# Patient Record
Sex: Male | Born: 1935 | Race: White | Hispanic: No | Smoking: Never smoker
Health system: Southern US, Community
[De-identification: ages and names within clinical notes are randomized; demographics above are authoritative.]

## PROBLEM LIST (undated history)

## (undated) DIAGNOSIS — K5790 Diverticulosis of intestine, part unspecified, without perforation or abscess without bleeding: Secondary | ICD-10-CM

## (undated) DIAGNOSIS — K224 Dyskinesia of esophagus: Secondary | ICD-10-CM

## (undated) DIAGNOSIS — M2540 Effusion, unspecified joint: Secondary | ICD-10-CM

## (undated) DIAGNOSIS — I2510 Atherosclerotic heart disease of native coronary artery without angina pectoris: Secondary | ICD-10-CM

## (undated) DIAGNOSIS — Z860100 Personal history of colon polyps, unspecified: Secondary | ICD-10-CM

## (undated) DIAGNOSIS — Z148 Genetic carrier of other disease: Secondary | ICD-10-CM

## (undated) DIAGNOSIS — R238 Other skin changes: Secondary | ICD-10-CM

## (undated) DIAGNOSIS — D649 Anemia, unspecified: Secondary | ICD-10-CM

## (undated) DIAGNOSIS — R197 Diarrhea, unspecified: Secondary | ICD-10-CM

## (undated) DIAGNOSIS — G8929 Other chronic pain: Secondary | ICD-10-CM

## (undated) DIAGNOSIS — F419 Anxiety disorder, unspecified: Secondary | ICD-10-CM

## (undated) DIAGNOSIS — F329 Major depressive disorder, single episode, unspecified: Secondary | ICD-10-CM

## (undated) DIAGNOSIS — M2550 Pain in unspecified joint: Secondary | ICD-10-CM

## (undated) DIAGNOSIS — M545 Low back pain, unspecified: Secondary | ICD-10-CM

## (undated) DIAGNOSIS — J42 Unspecified chronic bronchitis: Secondary | ICD-10-CM

## (undated) DIAGNOSIS — G2581 Restless legs syndrome: Secondary | ICD-10-CM

## (undated) DIAGNOSIS — G4733 Obstructive sleep apnea (adult) (pediatric): Secondary | ICD-10-CM

## (undated) DIAGNOSIS — I499 Cardiac arrhythmia, unspecified: Secondary | ICD-10-CM

## (undated) DIAGNOSIS — R350 Frequency of micturition: Secondary | ICD-10-CM

## (undated) DIAGNOSIS — M5430 Sciatica, unspecified side: Secondary | ICD-10-CM

## (undated) DIAGNOSIS — E119 Type 2 diabetes mellitus without complications: Secondary | ICD-10-CM

## (undated) DIAGNOSIS — H35343 Macular cyst, hole, or pseudohole, bilateral: Secondary | ICD-10-CM

## (undated) DIAGNOSIS — M1990 Unspecified osteoarthritis, unspecified site: Secondary | ICD-10-CM

## (undated) DIAGNOSIS — E8801 Alpha-1-antitrypsin deficiency: Secondary | ICD-10-CM

## (undated) DIAGNOSIS — K449 Diaphragmatic hernia without obstruction or gangrene: Secondary | ICD-10-CM

## (undated) DIAGNOSIS — N3010 Interstitial cystitis (chronic) without hematuria: Secondary | ICD-10-CM

## (undated) DIAGNOSIS — D126 Benign neoplasm of colon, unspecified: Secondary | ICD-10-CM

## (undated) DIAGNOSIS — K222 Esophageal obstruction: Secondary | ICD-10-CM

## (undated) DIAGNOSIS — K219 Gastro-esophageal reflux disease without esophagitis: Secondary | ICD-10-CM

## (undated) DIAGNOSIS — G4700 Insomnia, unspecified: Secondary | ICD-10-CM

## (undated) DIAGNOSIS — E785 Hyperlipidemia, unspecified: Secondary | ICD-10-CM

## (undated) DIAGNOSIS — F32A Depression, unspecified: Secondary | ICD-10-CM

## (undated) DIAGNOSIS — I480 Paroxysmal atrial fibrillation: Secondary | ICD-10-CM

## (undated) DIAGNOSIS — K5792 Diverticulitis of intestine, part unspecified, without perforation or abscess without bleeding: Secondary | ICD-10-CM

## (undated) DIAGNOSIS — R351 Nocturia: Secondary | ICD-10-CM

## (undated) DIAGNOSIS — M109 Gout, unspecified: Secondary | ICD-10-CM

## (undated) DIAGNOSIS — R0609 Other forms of dyspnea: Secondary | ICD-10-CM

## (undated) DIAGNOSIS — R233 Spontaneous ecchymoses: Secondary | ICD-10-CM

## (undated) DIAGNOSIS — I10 Essential (primary) hypertension: Secondary | ICD-10-CM

## (undated) DIAGNOSIS — K2970 Gastritis, unspecified, without bleeding: Secondary | ICD-10-CM

## (undated) DIAGNOSIS — R0600 Dyspnea, unspecified: Secondary | ICD-10-CM

## (undated) HISTORY — PX: OTHER SURGICAL HISTORY: SHX169

## (undated) HISTORY — DX: Diverticulosis of intestine, part unspecified, without perforation or abscess without bleeding: K57.90

## (undated) HISTORY — DX: Obstructive sleep apnea (adult) (pediatric): G47.33

## (undated) HISTORY — DX: Dyspnea, unspecified: R06.00

## (undated) HISTORY — PX: PARTIAL COLECTOMY: SHX5273

## (undated) HISTORY — DX: Benign neoplasm of colon, unspecified: D12.6

## (undated) HISTORY — PX: COLONOSCOPY: SHX174

## (undated) HISTORY — PX: TONSILLECTOMY AND ADENOIDECTOMY: SUR1326

## (undated) HISTORY — DX: Dyskinesia of esophagus: K22.4

## (undated) HISTORY — PX: JOINT REPLACEMENT: SHX530

## (undated) HISTORY — PX: CORONARY ANGIOPLASTY: SHX604

## (undated) HISTORY — PX: TOTAL KNEE ARTHROPLASTY: SHX125

## (undated) HISTORY — DX: Esophageal obstruction: K22.2

## (undated) HISTORY — DX: Other forms of dyspnea: R06.09

## (undated) HISTORY — DX: Paroxysmal atrial fibrillation: I48.0

## (undated) HISTORY — PX: HEEL SPUR EXCISION: SHX1733

## (undated) HISTORY — PX: RECONSTRUCTION MEDIAL COLLATERAL LIGAMENT ELBOW W/ TENDON GRAFT: SUR1084

## (undated) HISTORY — DX: Diaphragmatic hernia without obstruction or gangrene: K44.9

## (undated) HISTORY — PX: ESOPHAGOGASTRODUODENOSCOPY: SHX1529

## (undated) HISTORY — DX: Interstitial cystitis (chronic) without hematuria: N30.10

## (undated) HISTORY — PX: LUMBAR DISC SURGERY: SHX700

## (undated) HISTORY — DX: Diverticulitis of intestine, part unspecified, without perforation or abscess without bleeding: K57.92

## (undated) HISTORY — DX: Gastro-esophageal reflux disease without esophagitis: K21.9

## (undated) HISTORY — PX: CYSTOSCOPY WITH URETEROSCOPY: SHX5123

## (undated) HISTORY — PX: FRACTURE SURGERY: SHX138

## (undated) HISTORY — DX: Gastritis, unspecified, without bleeding: K29.70

---

## 1986-03-20 HISTORY — PX: ELBOW SURGERY: SHX618

## 2001-07-26 ENCOUNTER — Encounter: Admission: RE | Admit: 2001-07-26 | Discharge: 2001-07-26 | Payer: Self-pay | Admitting: Internal Medicine

## 2001-07-26 ENCOUNTER — Encounter: Payer: Self-pay | Admitting: Internal Medicine

## 2001-07-27 ENCOUNTER — Encounter: Payer: Self-pay | Admitting: Internal Medicine

## 2001-07-27 ENCOUNTER — Encounter: Admission: RE | Admit: 2001-07-27 | Discharge: 2001-07-27 | Payer: Self-pay | Admitting: Internal Medicine

## 2003-04-08 ENCOUNTER — Encounter: Admission: RE | Admit: 2003-04-08 | Discharge: 2003-04-08 | Payer: Self-pay | Admitting: Internal Medicine

## 2003-05-22 ENCOUNTER — Encounter (INDEPENDENT_AMBULATORY_CARE_PROVIDER_SITE_OTHER): Payer: Self-pay | Admitting: *Deleted

## 2003-05-22 ENCOUNTER — Ambulatory Visit (HOSPITAL_COMMUNITY): Admission: RE | Admit: 2003-05-22 | Discharge: 2003-05-22 | Payer: Self-pay | Admitting: Urology

## 2003-05-22 ENCOUNTER — Ambulatory Visit (HOSPITAL_BASED_OUTPATIENT_CLINIC_OR_DEPARTMENT_OTHER): Admission: RE | Admit: 2003-05-22 | Discharge: 2003-05-22 | Payer: Self-pay | Admitting: Urology

## 2003-07-20 ENCOUNTER — Encounter: Payer: Self-pay | Admitting: Cardiology

## 2003-07-20 ENCOUNTER — Ambulatory Visit (HOSPITAL_COMMUNITY): Admission: RE | Admit: 2003-07-20 | Discharge: 2003-07-20 | Payer: Self-pay | Admitting: Cardiology

## 2004-03-01 ENCOUNTER — Ambulatory Visit: Payer: Self-pay | Admitting: Physical Medicine & Rehabilitation

## 2004-03-01 ENCOUNTER — Inpatient Hospital Stay (HOSPITAL_COMMUNITY): Admission: RE | Admit: 2004-03-01 | Discharge: 2004-03-06 | Payer: Self-pay | Admitting: Orthopedic Surgery

## 2004-07-04 ENCOUNTER — Ambulatory Visit: Payer: Self-pay | Admitting: Gastroenterology

## 2004-08-01 ENCOUNTER — Ambulatory Visit: Payer: Self-pay | Admitting: Gastroenterology

## 2004-08-05 ENCOUNTER — Encounter (INDEPENDENT_AMBULATORY_CARE_PROVIDER_SITE_OTHER): Payer: Self-pay | Admitting: Specialist

## 2004-08-05 ENCOUNTER — Ambulatory Visit: Payer: Self-pay | Admitting: Gastroenterology

## 2004-08-05 ENCOUNTER — Ambulatory Visit (HOSPITAL_COMMUNITY): Admission: RE | Admit: 2004-08-05 | Discharge: 2004-08-05 | Payer: Self-pay | Admitting: Gastroenterology

## 2005-01-23 ENCOUNTER — Ambulatory Visit: Payer: Self-pay | Admitting: Internal Medicine

## 2005-01-30 ENCOUNTER — Encounter (INDEPENDENT_AMBULATORY_CARE_PROVIDER_SITE_OTHER): Payer: Self-pay | Admitting: Specialist

## 2005-01-30 ENCOUNTER — Ambulatory Visit: Payer: Self-pay | Admitting: Internal Medicine

## 2005-01-31 ENCOUNTER — Encounter (INDEPENDENT_AMBULATORY_CARE_PROVIDER_SITE_OTHER): Payer: Self-pay | Admitting: Specialist

## 2005-01-31 ENCOUNTER — Inpatient Hospital Stay (HOSPITAL_COMMUNITY): Admission: RE | Admit: 2005-01-31 | Discharge: 2005-02-06 | Payer: Self-pay | Admitting: General Surgery

## 2005-01-31 ENCOUNTER — Encounter (INDEPENDENT_AMBULATORY_CARE_PROVIDER_SITE_OTHER): Payer: Self-pay | Admitting: *Deleted

## 2005-02-15 ENCOUNTER — Encounter (INDEPENDENT_AMBULATORY_CARE_PROVIDER_SITE_OTHER): Payer: Self-pay | Admitting: *Deleted

## 2005-10-02 ENCOUNTER — Encounter (INDEPENDENT_AMBULATORY_CARE_PROVIDER_SITE_OTHER): Payer: Self-pay | Admitting: Specialist

## 2005-10-02 ENCOUNTER — Ambulatory Visit (HOSPITAL_BASED_OUTPATIENT_CLINIC_OR_DEPARTMENT_OTHER): Admission: RE | Admit: 2005-10-02 | Discharge: 2005-10-02 | Payer: Self-pay | Admitting: Urology

## 2005-11-10 ENCOUNTER — Ambulatory Visit: Payer: Self-pay | Admitting: Internal Medicine

## 2005-11-13 ENCOUNTER — Encounter: Payer: Self-pay | Admitting: Internal Medicine

## 2005-11-13 ENCOUNTER — Ambulatory Visit: Payer: Self-pay | Admitting: Internal Medicine

## 2006-01-22 ENCOUNTER — Ambulatory Visit: Payer: Self-pay | Admitting: Internal Medicine

## 2006-01-22 LAB — CONVERTED CEMR LAB
Fecal Occult Blood: NEGATIVE
OCCULT 1: POSITIVE — AB
OCCULT 4: NEGATIVE
OCCULT 5: NEGATIVE

## 2006-02-02 ENCOUNTER — Ambulatory Visit: Payer: Self-pay | Admitting: Internal Medicine

## 2006-03-08 ENCOUNTER — Ambulatory Visit: Payer: Self-pay | Admitting: Internal Medicine

## 2006-03-08 ENCOUNTER — Encounter: Payer: Self-pay | Admitting: Internal Medicine

## 2006-04-16 ENCOUNTER — Ambulatory Visit: Payer: Self-pay | Admitting: Internal Medicine

## 2006-09-17 ENCOUNTER — Ambulatory Visit (HOSPITAL_BASED_OUTPATIENT_CLINIC_OR_DEPARTMENT_OTHER): Admission: RE | Admit: 2006-09-17 | Discharge: 2006-09-17 | Payer: Self-pay | Admitting: Urology

## 2007-03-21 HISTORY — PX: APPENDECTOMY: SHX54

## 2007-03-21 HISTORY — PX: CHOLECYSTECTOMY: SHX55

## 2007-03-21 HISTORY — PX: EXPLORATORY LAPAROTOMY: SUR591

## 2007-12-27 ENCOUNTER — Ambulatory Visit: Payer: Self-pay | Admitting: Internal Medicine

## 2008-03-03 ENCOUNTER — Ambulatory Visit: Payer: Self-pay | Admitting: Internal Medicine

## 2008-03-03 ENCOUNTER — Encounter: Payer: Self-pay | Admitting: Internal Medicine

## 2008-03-04 ENCOUNTER — Encounter: Payer: Self-pay | Admitting: Internal Medicine

## 2008-08-31 DIAGNOSIS — K449 Diaphragmatic hernia without obstruction or gangrene: Secondary | ICD-10-CM | POA: Insufficient documentation

## 2008-08-31 DIAGNOSIS — K219 Gastro-esophageal reflux disease without esophagitis: Secondary | ICD-10-CM

## 2008-08-31 DIAGNOSIS — K2970 Gastritis, unspecified, without bleeding: Secondary | ICD-10-CM

## 2008-08-31 DIAGNOSIS — K222 Esophageal obstruction: Secondary | ICD-10-CM

## 2008-08-31 DIAGNOSIS — K2990 Gastroduodenitis, unspecified, without bleeding: Secondary | ICD-10-CM

## 2008-08-31 DIAGNOSIS — Z860100 Personal history of colon polyps, unspecified: Secondary | ICD-10-CM | POA: Insufficient documentation

## 2008-08-31 DIAGNOSIS — K5730 Diverticulosis of large intestine without perforation or abscess without bleeding: Secondary | ICD-10-CM | POA: Insufficient documentation

## 2008-08-31 DIAGNOSIS — K649 Unspecified hemorrhoids: Secondary | ICD-10-CM | POA: Insufficient documentation

## 2008-08-31 DIAGNOSIS — N3010 Interstitial cystitis (chronic) without hematuria: Secondary | ICD-10-CM

## 2008-09-07 ENCOUNTER — Ambulatory Visit: Payer: Self-pay | Admitting: Internal Medicine

## 2008-12-08 ENCOUNTER — Ambulatory Visit (HOSPITAL_COMMUNITY): Admission: RE | Admit: 2008-12-08 | Discharge: 2008-12-08 | Payer: Self-pay | Admitting: Cardiovascular Disease

## 2008-12-17 ENCOUNTER — Ambulatory Visit (HOSPITAL_COMMUNITY): Admission: RE | Admit: 2008-12-17 | Discharge: 2008-12-17 | Payer: Self-pay | Admitting: Cardiovascular Disease

## 2008-12-17 ENCOUNTER — Encounter: Payer: Self-pay | Admitting: Orthopaedic Surgery

## 2009-03-20 HISTORY — PX: CATARACT EXTRACTION W/ INTRAOCULAR LENS  IMPLANT, BILATERAL: SHX1307

## 2009-04-30 ENCOUNTER — Ambulatory Visit: Payer: Self-pay | Admitting: Internal Medicine

## 2009-04-30 ENCOUNTER — Inpatient Hospital Stay (HOSPITAL_COMMUNITY): Admission: AD | Admit: 2009-04-30 | Discharge: 2009-05-02 | Payer: Self-pay | Admitting: Internal Medicine

## 2009-04-30 ENCOUNTER — Encounter (INDEPENDENT_AMBULATORY_CARE_PROVIDER_SITE_OTHER): Payer: Self-pay | Admitting: *Deleted

## 2009-04-30 ENCOUNTER — Telehealth: Payer: Self-pay | Admitting: Internal Medicine

## 2009-04-30 ENCOUNTER — Encounter: Admission: RE | Admit: 2009-04-30 | Discharge: 2009-04-30 | Payer: Self-pay | Admitting: Internal Medicine

## 2009-05-03 ENCOUNTER — Telehealth: Payer: Self-pay | Admitting: Internal Medicine

## 2009-05-18 ENCOUNTER — Ambulatory Visit: Payer: Self-pay | Admitting: Internal Medicine

## 2009-12-12 ENCOUNTER — Encounter: Admission: RE | Admit: 2009-12-12 | Discharge: 2009-12-12 | Payer: Self-pay | Admitting: Orthopaedic Surgery

## 2009-12-22 ENCOUNTER — Ambulatory Visit (HOSPITAL_COMMUNITY): Admission: RE | Admit: 2009-12-22 | Discharge: 2009-12-23 | Payer: Self-pay | Admitting: Orthopaedic Surgery

## 2009-12-31 ENCOUNTER — Encounter: Payer: Self-pay | Admitting: Internal Medicine

## 2010-02-04 ENCOUNTER — Ambulatory Visit: Payer: Self-pay | Admitting: Internal Medicine

## 2010-02-04 ENCOUNTER — Telehealth: Payer: Self-pay | Admitting: Internal Medicine

## 2010-02-04 DIAGNOSIS — R1032 Left lower quadrant pain: Secondary | ICD-10-CM | POA: Insufficient documentation

## 2010-02-16 ENCOUNTER — Telehealth: Payer: Self-pay | Admitting: Nurse Practitioner

## 2010-03-22 ENCOUNTER — Ambulatory Visit
Admission: RE | Admit: 2010-03-22 | Discharge: 2010-03-22 | Payer: Self-pay | Source: Home / Self Care | Attending: Internal Medicine | Admitting: Internal Medicine

## 2010-04-12 ENCOUNTER — Inpatient Hospital Stay (HOSPITAL_COMMUNITY)
Admission: RE | Admit: 2010-04-12 | Discharge: 2010-04-17 | Payer: Self-pay | Source: Home / Self Care | Attending: Orthopedic Surgery | Admitting: Orthopedic Surgery

## 2010-04-12 LAB — CBC
MCHC: 34.5 g/dL (ref 30.0–36.0)
Platelets: 148 10*3/uL — ABNORMAL LOW (ref 150–400)
RDW: 15 % (ref 11.5–15.5)

## 2010-04-12 LAB — URINALYSIS, ROUTINE W REFLEX MICROSCOPIC
Hgb urine dipstick: NEGATIVE
Nitrite: NEGATIVE
Protein, ur: NEGATIVE mg/dL
Urobilinogen, UA: 1 mg/dL (ref 0.0–1.0)

## 2010-04-12 LAB — DIFFERENTIAL
Basophils Absolute: 0 10*3/uL (ref 0.0–0.1)
Basophils Relative: 0 % (ref 0–1)
Eosinophils Absolute: 0.1 10*3/uL (ref 0.0–0.7)
Eosinophils Relative: 1 % (ref 0–5)
Monocytes Absolute: 0.5 10*3/uL (ref 0.1–1.0)

## 2010-04-12 LAB — COMPREHENSIVE METABOLIC PANEL
ALT: 27 U/L (ref 0–53)
Alkaline Phosphatase: 59 U/L (ref 39–117)
Glucose, Bld: 93 mg/dL (ref 70–99)
Potassium: 4.5 mEq/L (ref 3.5–5.1)
Sodium: 142 mEq/L (ref 135–145)
Total Protein: 6.4 g/dL (ref 6.0–8.3)

## 2010-04-12 LAB — TYPE AND SCREEN: Antibody Screen: NEGATIVE

## 2010-04-12 LAB — PROTIME-INR: Prothrombin Time: 13.7 seconds (ref 11.6–15.2)

## 2010-04-12 LAB — SURGICAL PCR SCREEN: Staphylococcus aureus: NEGATIVE

## 2010-04-13 LAB — URINE CULTURE
Culture  Setup Time: 201201231430
Culture: NO GROWTH

## 2010-04-13 LAB — CBC
Hemoglobin: 10 g/dL — ABNORMAL LOW (ref 13.0–17.0)
MCHC: 34.4 g/dL (ref 30.0–36.0)
Platelets: 139 10*3/uL — ABNORMAL LOW (ref 150–400)
RDW: 15.6 % — ABNORMAL HIGH (ref 11.5–15.5)

## 2010-04-13 LAB — BASIC METABOLIC PANEL
BUN: 10 mg/dL (ref 6–23)
Calcium: 8.2 mg/dL — ABNORMAL LOW (ref 8.4–10.5)
Creatinine, Ser: 1.06 mg/dL (ref 0.4–1.5)
GFR calc non Af Amer: >60 mL/min (ref >60)

## 2010-04-13 LAB — PROTIME-INR
INR: 1.18 (ref 0.00–1.49)
Prothrombin Time: 15.2 seconds (ref 11.6–15.2)

## 2010-04-14 LAB — BASIC METABOLIC PANEL
BUN: 8 mg/dL (ref 6–23)
CO2: 27 mEq/L (ref 19–32)
Calcium: 8.4 mg/dL (ref 8.4–10.5)
Creatinine, Ser: 1.03 mg/dL (ref 0.4–1.5)
GFR calc Af Amer: >60 mL/min (ref >60)

## 2010-04-14 LAB — CBC
Platelets: 122 10*3/uL — ABNORMAL LOW (ref 150–400)
RDW: 15.7 % — ABNORMAL HIGH (ref 11.5–15.5)
WBC: 6.6 10*3/uL (ref 4.0–10.5)

## 2010-04-14 LAB — PROTIME-INR: INR: 1.28 (ref 0.00–1.49)

## 2010-04-15 LAB — CBC
MCH: 29.5 pg (ref 26.0–34.0)
Platelets: 181 10*3/uL (ref 150–400)
RBC: 3.19 MIL/uL — ABNORMAL LOW (ref 4.22–5.81)
RDW: 15.3 % (ref 11.5–15.5)
WBC: 8 10*3/uL (ref 4.0–10.5)

## 2010-04-15 LAB — PROTIME-INR: Prothrombin Time: 15.8 seconds — ABNORMAL HIGH (ref 11.6–15.2)

## 2010-04-16 LAB — PROTIME-INR
INR: 1.38 (ref 0.00–1.49)
Prothrombin Time: 17.2 seconds — ABNORMAL HIGH (ref 11.6–15.2)

## 2010-04-17 LAB — PROTIME-INR: Prothrombin Time: 16.6 seconds — ABNORMAL HIGH (ref 11.6–15.2)

## 2010-04-19 NOTE — Progress Notes (Signed)
Summary: Triage  Phone Note Call from Patient Call back at Home Phone [phone]   Caller: Patient Call For: Dr. Juanda Chance Reason for Call: Talk to Nurse Summary of Call: Lambert Mody LLQ pain, nausea since yesterday Initial call taken by: Karna Christmas,  February 04, 2010 8:04 AM  Follow-up for Phone Call        Patient calling to report LLQ pain that started yesterday at 4 PM. States it feels like it did when he had diverticulitis. Denies diarrhea or vomiting. Has slight constipation. Last BM yesterday. States he has some nausea about 2 hours after eating. Taking Omperazole, Allopurinol and Oxycodone. Patient states he had back surgery 5 weeks ago. Scheduled with Willette Cluster, RNP for today at 2:00 PM. Follow-up by: Jesse Fall RN,  February 04, 2010 9:27 AM  Additional Follow-up for Phone Call Additional follow up Details #1::        reviewed and agree Additional Follow-up by: Hart Carwin MD,  February 04, 2010 10:10 PM

## 2010-04-19 NOTE — Assessment & Plan Note (Signed)
Summary: POST HOSPITAL F/U, DIVERTICULITIS        Lucas Morales   History of Present Illness Visit Type: Follow-up Visit Primary GI MD: [name] MD Primary Provider: Merri Brunette, MD Chief Complaint: Post hospital F/U diverticlitis, with minor problems History of Present Illness:   This is a 77 year old, white male who is status post hospitalization for diverticulitis between February 11 -14, 2011. A CT Scan of the abdomen showed inflammatory changes in the left colon consistent with diverticulitis. He is doing well now without abdominal pain. He is status post right hemicolectomy in November 2006 for a carpeted  adenomatous polyp in hepatic flexure. He has interstitial cystitis and a history of a mild esophageal stricture dilated in August 2007.   GI Review of Systems    Reports bloating.      Denies abdominal pain, acid reflux, belching, chest pain, dysphagia with liquids, dysphagia with solids, heartburn, loss of appetite, nausea, vomiting, vomiting blood, weight loss, and  weight gain.      Reports constipation and  diarrhea.     Denies anal fissure, black tarry stools, change in bowel habit, diverticulosis, fecal incontinence, heme positive stool, hemorrhoids, irritable bowel syndrome, jaundice, light color stool, liver problems, rectal bleeding, and  rectal pain.    Current Medications (verified): 1)  Imodium A-D 2 Mg Tabs (Loperamide Hcl) .... Take As Needed Diarrhea 2)  Daily-Vitamin  Tabs (Multiple Vitamin) .[name].. 1 Tablet By Mouth Once Daily 3)  Simethicone 125 Mg Chew (Simethicone) .[name].. 1 By Mouth Two Times A Day 4)  Oxycodone-Acetaminophen 5-500 Mg Caps (Oxycodone-Acetaminophen) .... One Tablet By Mouth Every 4 Hours As Needed 5)  Tums 500 Mg Chew (Calcium Carbonate Antacid) .... 2 in The Morning and 2 At Night  Allergies (verified): 1)  ! Morphine  Past History:  Past Medical History: Reviewed history from 04/30/2009 and no changes required. Current Problems:  COLONIC  POLYPS, ADENOMATOUS, HX OF (ICD-V12.72) HEMORRHOIDS (ICD-455.6) DIVERTICULOSIS, COLON (ICD-562.10) GASTRITIS (ICD-535.50) GERD (ICD-530.81) INTERSTITIAL CYSTITIS (ICD-595.1) Hx of ESOPHAGEAL STRICTURE (ICD-530.3) HIATAL HERNIA (ICD-553.3)  Diverticulitis  Past Surgical History: Reviewed history from 09/07/2008 and no changes required. Elbow Reconstruction Knee Replacement Benign Bladder lesion removed Exploratory Laparotomy with Right Colectomy Cholecystectomy Appendectomy  Family History: Reviewed history from 08/31/2008 and no changes required. No FH of Colon Cancer:  Social History: Reviewed history from 09/07/2008 and no changes required. Alcohol Use - yes-socially,rarely Illicit Drug Use - no Occupation: retired Patient has never smoked.   Review of Systems       The patient complains of arthritis/joint pain, back pain, change in vision, muscle pains/cramps, shortness of breath, urination - excessive, and urination changes/pain.  The patient denies allergy/sinus, anemia, anxiety-new, blood in urine, breast changes/lumps, confusion, cough, coughing up blood, depression-new, fainting, fatigue, fever, headaches-new, hearing problems, heart murmur, heart rhythm changes, itching, menstrual pain, night sweats, nosebleeds, pregnancy symptoms, skin rash, sleeping problems, sore throat, swelling of feet/legs, swollen lymph glands, thirst - excessive , urination - excessive , urine leakage, vision changes, and voice change.         Pertinent positive and negative review of systems were noted in the above HPI. All other ROS was otherwise negative.   Vital Signs:  Patient profile:   77 year old male Height:      76 inches Weight:      229.50 pounds BMI:     28.04 Pulse rate:   68 / minute Pulse rhythm:   regular BP sitting:   122 / 70  (  left arm) Cuff size:   regular  Vitals Entered By: June McMurray CMA Duncan Dull) (May 18, 2009 12:06 PM)  Physical Exam  General:  Well  developed, well nourished, no acute distress. Lungs:  Clear throughout to auscultation. Heart:  Regular rate and rhythm; no murmurs, rubs,  or bruits. Abdomen:  Soft, nontender and nondistended. No masses, hepatosplenomegaly or hernias noted. Normal bowel sounds.   Impression & Recommendations:  Problem # 1:  COLONIC POLYPS, ADENOMATOUS, HX OF (ICD-V12.72) Patient is status post right hemicolectomy in 2006. His next colonoscopy will be due in December 2014.  Problem # 2:  DIVERTICULOSIS, COLON (ICD-562.10) Patient is status post diverticulitis. This was the first attack. He will start Benefiber 1 tablespoon daily and a high-fiber diet. We will give him a Cipro 250 mg twice a day prescription for patient to take with him on his trip to [place].  Patient Instructions: 1)  high-fiber diet. 2)  Benefiber one tablespoon daily. 3)  Recall colonoscopy December 2014. 4)  Cipro 250 p.o. b.i.d. p.r.n. flareup of diverticulitis. 5)  The medication list was reviewed and reconciled.  All changed / newly prescribed medications were explained.  A complete medication list was provided to the patient / caregiver. Prescriptions: CIPRO 250 MG TABS (CIPROFLOXACIN HCL) Take 1 tablet by mouth two times a day  #20 x 0   Entered by:   Hortense Ramal CMA (AAMA)   Authorized by:   Hart Carwin MD   Signed by:   Hortense Ramal CMA (AAMA) on 05/18/2009   Method used:   Electronically to        [facility] #[id number]* (retail)       [address] [place], [place]  [address]       Ph: [phone]       Fax: [phone]   RxID:   [id number]

## 2010-04-19 NOTE — Assessment & Plan Note (Signed)
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 Juanda Chance for history of colon polyps and diverticulitis. He had a right hemicolectomy for a large adenomatous polyp of the right colon 2006.  In March of this year patient was hospitalized with left sided diverticulitis.  Patient is here for evaluation of  a 3-4 week history of nausea after a large meal and new onset LLQ. Yesterday he developed piercing LLQ pain. Overnight pain became more generalized across lower abdomen. Pain okay while sitting still, worse when walking around. Patient isn't sure if he has had associated chills. Patient has interstitial cystitis for which he takes Hydrocodone. Current abdominal pain definately not typical burning pain related to IC.   He has knee pain, got injection today and scheduled for surgery Jan. 10th. He had back surgery 5 weeks ago. After surgery had constipation but that resolved. No rectal bleeding.      GI Review of Systems    Reports abdominal pain and  nausea.     Location of  Abdominal pain: LLQ.    Denies acid reflux, belching, bloating, chest pain, dysphagia with liquids, dysphagia with solids, heartburn, loss of appetite, vomiting, vomiting blood, weight loss, and  weight gain.      Reports constipation and  diverticulosis.     Denies anal fissure, black tarry stools, change in bowel habit, diarrhea, fecal incontinence, heme positive stool, hemorrhoids, irritable bowel syndrome, jaundice, light color stool, liver problems, rectal bleeding, and  rectal pain.    Current Medications (verified): 1)  Imodium A-D 2 Mg Tabs (Loperamide Hcl) .... Take As Needed Diarrhea 2)  Daily-Vitamin  Tabs (Multiple Vitamin) .[name].. 1 Tablet By Mouth Once Daily 3)   Oxycodone-Acetaminophen 5-500 Mg Caps (Oxycodone-Acetaminophen) .... One Tablet By Mouth Every 4 Hours As Needed 4)  Tums 500 Mg Chew (Calcium Carbonate Antacid) .... 2 in The Morning and 2 At Night 5)  Omeprazole 20 Mg Cpdr (Omeprazole) .[name].. 1 By Mouth Once Daily 6)  Allopurinol 100 Mg Tabs (Allopurinol) .[name].. 1 By Mouth Once Daily  Allergies (verified): 1)  ! Morphine  Past History:  Past Medical History: Last updated: 04/30/2009 Current Problems:  COLONIC POLYPS, ADENOMATOUS, HX OF (ICD-V12.72) HEMORRHOIDS (ICD-455.6) DIVERTICULOSIS, COLON (ICD-562.10) GASTRITIS (ICD-535.50) GERD (ICD-530.81) INTERSTITIAL CYSTITIS (ICD-595.1) Hx of ESOPHAGEAL STRICTURE (ICD-530.3) HIATAL HERNIA (ICD-553.3)  Diverticulitis  Family History: Last updated: 02/04/2010 No FH of Colon Cancer: Family History of Heart Disease: mother died with massive heart attack  Social History: Last updated: 09/07/2008 Alcohol Use - yes-socially,rarely Illicit Drug Use - no Occupation: retired Patient has never smoked.   Past Surgical History: Elbow Reconstruction Knee Replacement Benign Bladder lesion removed Exploratory Laparotomy with Right Colectomy Cholecystectomy Appendectomy Back surgery  Family History: No FH of Colon Cancer: Family History of Heart Disease: mother died with massive heart attack  Review of Systems       The patient complains of fatigue and sleeping problems.  The patient denies allergy/sinus, anemia, anxiety-new, arthritis/joint pain, back pain, blood in urine, breast changes/lumps, change in vision, confusion, cough, coughing up blood, depression-new, fainting, fever, headaches-new, hearing problems, heart murmur, heart rhythm changes, itching, muscle pains/cramps, night sweats, nosebleeds, shortness of breath, skin rash, sore throat, swelling of feet/legs, swollen lymph glands, thirst - excessive, urination - excessive, urination changes/pain, urine leakage, vision  changes, and  voice change.    Vital Signs:  Patient profile:   77 year old male Height:      76 inches Weight:      243 pounds BMI:     29.69 BSA:     2.41 Pulse rate:   88 / minute Pulse rhythm:   regular BP sitting:   142 / 82  (left arm)  Vitals Entered By: Merri Ray CMA Duncan Dull) (February 04, 2010 2:11 PM)  Physical Exam  General:  Well developed, well nourished, no acute distress. Head:  Normocephalic and atraumatic. Eyes:  Conjunctiva pink, no icterus.  Mouth:  No oral lesions. Tongue moist.  Neck:  no obvious masses  Lungs:  Clear throughout to auscultation. Heart:  Regular rate and rhythm; no murmurs, rubs,  or bruits. Abdomen:  Abdomen soft, significant LLQ tenderness withou peritoneal signs.Nondistended. No obvious masses or hepatomegaly.Normal bowel sounds.  Msk:  Symmetrical with no gross deformities. Normal posture. Extremities:  No palmar erythema, no edema.  Neurologic:  Alert and  oriented x4;  grossly normal neurologically. Skin:  Intact without significant lesions or rashes. Cervical Nodes:  No significant cervical adenopathy. Psych:  Alert and cooperative. Normal mood and affect.   Impression & Recommendations:  Problem # 1:  ABDOMINAL PAIN-LLQ (ICD-789.04) Assessment Deteriorated Likely recurrent diverticulitis as pain is same when hospitalized in March of this year with first episode of diverticulitis.  Will treat with Flagyl and Cipro. Follow up with [place] in six weeks but in the meantime patient will call with fever or worsening pain.   Problem # 2:  COLONIC POLYPS, ADENOMATOUS, HX OF (ICD-V12.72) Assessment: Comment Only Right hemicolectomy in 2006  Patient Instructions: 1)  Copy sent to : Merri Brunette, MD 2)  We are sending in your prescriptions to your pharmacy today 3)  You will need to follow up in 6 weeks 4)  If you are no better by Tuesday, Please contact the office 5)  The medication list was reviewed and reconciled.  All changed / newly  prescribed medications were explained.  A complete medication list was provided to the patient / caregiver. Prescriptions: CIPRO 500 MG TABS (CIPROFLOXACIN HCL) 1 by mouth two times a day for 10 days  #20 x 0   Entered by:   Lowry Ram NCMA   Authorized by:   Willette Cluster NP   Signed by:   Lowry Ram NCMA on 02/04/2010   Method used:   Electronically to        [facility] #[id number]* (retail)       [address] [place], [place]  [address]       Ph: [phone]       Fax: [phone]   RxID:   [id number] FLAGYL 500 MG TABS (METRONIDAZOLE) 1 by mouth three times a day for 10 days  #30 x 0   Entered by:   Lowry Ram NCMA   Authorized by:   Willette Cluster NP   Signed by:   Lowry Ram NCMA on 02/04/2010   Method used:   Electronically to        [facility] #[id number]* (retail)       [address] [place], [place]  [address]       Ph: [phone]       Fax: [phone]   RxID:   [id number]

## 2010-04-19 NOTE — Progress Notes (Signed)
Summary: TRIAGE-Severe LLQ Pain  Phone Note From Other Clinic Call back at [phone]   Caller: Kennon Rounds, CMA Call For: Dr. Juanda Chance Reason for Call: Schedule Patient Appt Summary of Call: pt being seen by Dr. Merri Brunette for CT scan... would like pt to be sch'ed today to be seen for left lower quad pain Initial call taken by: Vallarie Mare,  April 30, 2009 10:07 AM  Follow-up for Phone Call        Pt. has severe LLQ pain for  5 days, he is having a STAT CT at [facility]. Pain is getting worse, Dr.Pharr wants pt. seen TODAY. Pt. will see Dr.Samiksha Pellicano at 2:15pm today. Kennon Rounds will fax OV note, labs and CT report. She will advise pt. of appt/med.list/co-pay.  Follow-up by: Laureen Ochs LPN,  April 30, 2009 11:00 AM

## 2010-04-19 NOTE — Initial Assessments (Signed)
Summary: SEVERE LLQ PAIN, HAVING A STAT CT NOW.    DEBORAH   History of Present Illness Visit Type: Initial Consult Primary GI MD: [name] MD Primary Provider: Merri Brunette, MD Chief Complaint: LLQ sharp abd pains increasing since last Friday with N/V/D. Pt states he has loss of appetite. Pt also has had CT scan showing diverticulitis.  History of Present Illness:   This is a 77 year old white male with acute left lower quadrant abdominal pain of one week duration which has been increasing in intensity. It started with mild lower abdominal discomfort one week ago and the patient attributed it to a flare up of his interstitial cystitis. There has been no fever. He has gradually become unable to eat and stays only on liquids. He denies any rectal bleeding. Patient has been taking oxycodone for pain. A CT scan of the abdomen done this morning which was ordered by Dr.Pharr shows a wall thickening of the distal left colon including the proximal aspect of the sigmoid colon with many diverticuli. There is pericolic edema but no evidence of abscess or free air. His White cell count is 4000 and hemoglobin is 13.5. BUN is 25, and creatinine is 1.6 with an albumin of 3.2.  There is a history of a premalignant polyp of the colon which was resected with a right hemicolectomy in November 2006. He continued to be heme positive and in August 2007, an upper endoscopy was performed which showed a hiatal hernia with esophageal stricture. His last colonoscopy in December 2007 showed a polypoid lesion at the anastomosis which appeared to be a pseudopolyp consisting of inflammatory tissue. His most recent colonoscopy in December 2009 showed resolution of the inflammatory changes.   GI Review of Systems    Reports abdominal pain, loss of appetite, nausea, and  vomiting.     Location of  Abdominal pain: LLQ.    Denies acid reflux, belching, bloating, chest pain, dysphagia with liquids, dysphagia with solids, heartburn,  vomiting blood, weight loss, and  weight gain.      Reports diarrhea and  diverticulosis.     Denies anal fissure, black tarry stools, change in bowel habit, constipation, fecal incontinence, heme positive stool, hemorrhoids, irritable bowel syndrome, jaundice, light color stool, liver problems, rectal bleeding, and  rectal pain.    Current Medications (verified): 1)  Imodium A-D 2 Mg Tabs (Loperamide Hcl) .... Take As Needed Diarrhea 2)  Daily-Vitamin  Tabs (Multiple Vitamin) .[name].. 1 Tablet By Mouth Once Daily 3)  Simethicone 125 Mg Chew (Simethicone) .[name].. 1 By Mouth Two Times A Day 4)  Oxycodone-Acetaminophen 5-500 Mg Caps (Oxycodone-Acetaminophen) .... One Tablet By Mouth Every 4 Hours As Needed 5)  Tums 500 Mg Chew (Calcium Carbonate Antacid) .... 2 in The Morning and 2 At Night  Allergies (verified): 1)  ! Morphine  Past History:  Past Medical History: Current Problems:  COLONIC POLYPS, ADENOMATOUS, HX OF (ICD-V12.72) HEMORRHOIDS (ICD-455.6) DIVERTICULOSIS, COLON (ICD-562.10) GASTRITIS (ICD-535.50) GERD (ICD-530.81) INTERSTITIAL CYSTITIS (ICD-595.1) Hx of ESOPHAGEAL STRICTURE (ICD-530.3) HIATAL HERNIA (ICD-553.3)  Diverticulitis  Past Surgical History: Reviewed history from 09/07/2008 and no changes required. Elbow Reconstruction Knee Replacement Benign Bladder lesion removed Exploratory Laparotomy with Right Colectomy Cholecystectomy Appendectomy  Family History: Reviewed history from 08/31/2008 and no changes required. No FH of Colon Cancer:  Social History: Reviewed history from 09/07/2008 and no changes required. Alcohol Use - yes-socially,rarely Illicit Drug Use - no Occupation: retired Patient has never smoked.   Review of Systems  The patient denies  allergy/sinus, anemia, anxiety-new, arthritis/joint pain, back pain, blood in urine, breast changes/lumps, change in vision, confusion, cough, coughing up blood, depression-new, fainting, fatigue, fever,  headaches-new, hearing problems, heart murmur, heart rhythm changes, itching, menstrual pain, muscle pains/cramps, night sweats, nosebleeds, pregnancy symptoms, shortness of breath, skin rash, sleeping problems, sore throat, swelling of feet/legs, swollen lymph glands, thirst - excessive , urination - excessive , urination changes/pain, urine leakage, vision changes, and voice change.         Pertinent positive and negative review of systems were noted in the above HPI. All other ROS was otherwise negative.   Vital Signs:  Patient profile:   77 year old male Height:      76 inches Weight:      231.38 pounds BMI:     28.27 Pulse rate:   100 / minute Pulse rhythm:   regular BP sitting:   132 / 70  (right arm) Cuff size:   regular  Vitals Entered By: Christie Nottingham CMA Duncan Dull) (April 30, 2009 2:11 PM)  Physical Exam  General:  alert, oriented and in mild distress. Eyes:  nonicteric. Mouth:  normal mucosa. Neck:  no bruits. Lungs:  Clear throughout to auscultation. Heart:  Regular rate and rhythm; no murmurs, rubs,  or bruits. Abdomen:  abdomen is mildly protuberant with increased tympany in the left upper quadrant with hyperactive bowel sounds and exquisite tenderness along the left upper quadrant extending to left lower quadrant. There was mild rebound in that area. Rectal:  soft Hemoccult negative stool. Extremities:  No clubbing, cyanosis, edema or deformities noted. Skin:  Intact without significant lesions or rashes. Psych:  Alert and cooperative. Normal mood and affect.   Impression & Recommendations:  Problem # 1:  DIVERTICULOSIS, COLON (ICD-562.10) Patient has known moderately severe diverticulosis of the left colon with an acute episode of diverticulitis of 5 days duration. A CT scan supports the diagnosis involving the left colon. His physical exam is impressive in that it may be suggestive of peritonitis. We will admit him for 24-hour observation and intravenous  antibiotics as well as for bowel rest and pain control. I have discussed the hospitalization with the patient and have given him a choice of going home with the understanding that he may have to return for IV antibiotics. His main concern is about the possibility of a perforation or abscess.  Problem # 2:  HEMORRHOIDS (ICD-455.6) not symptomatic.  Problem # 3:  COLONIC POLYPS, ADENOMATOUS, HX OF (ICD-V12.72) Patient is up-to-date on his colonoscopy with the last one being in December 2009.  Problem # 4:  GERD (ICD-530.81) Patient is treated with PPIs; omeprazole 20 mg daily.  Problem # 5:  INTERSTITIAL CYSTITIS (ICD-595.1) followed by Dr.Evans.  Patient Instructions: 1)  Admit for 24-hour observation, intravenous antibiotics and bowel rest as well as for pain control. I have notified the PA as well as Dr. Christella Hartigan who is on-call this weekend. 2)  Admitting orders have been written. 3)  Copy sent to : Dr Kennedy Bucker, Dr W.Pharr 4)  The medication list was reviewed and reconciled.  All changed / newly prescribed medications were explained.  A complete medication list was provided to the patient / caregiver.

## 2010-04-19 NOTE — Progress Notes (Signed)
Summary: Triage  Phone Note Call from Patient Call back at Home Phone [phone]   Caller: Patient Call For: Lucas Morales Summary of Call: Pt finished medication for diverticulitus but stomach is still very upset, feels like it is "rolling", wants to speak with nurse Initial call taken by: [place] Johnson,  February 16, 2010 9:10 AM  Follow-up for Phone Call        Patient calling to report he has finished the Cipro and Flagyl that was prescribed for diverticulitis. He reports he still  has a "residual stomach ache not like pain with diverticulitis." Reports loose bowel movements every 2 hours. Denies cramping, bleeding, nausea, vomiting or fever. He does report he has a cold. He wants to know if he should continure more antibiotics. Please, advise. Follow-up by: Jesse Fall RN,  February 16, 2010 10:31 AM  Additional Follow-up for Phone Call Additional follow up Details #1::        I have left a message on pt's phone. No more antibiotics. Please call in Bentyl 10mg , #30 1 by mouth two times a day or three times a day . Call us with an update. Additional Follow-up by: Hart Carwin MD,  February 16, 2010 10:04 PM    Additional Follow-up for Phone Call Additional follow up Details #2::    I have sent prescriptions to patient's pharmacy. Patient to call back with an update soon. Follow-up by: Lamona Curl CMA (AAMA),  February 17, 2010 9:09 AM  New/Updated Medications: BENTYL 10 MG CAPS (DICYCLOMINE HCL) Take 1 tablet by mouth two times a day to three times a day Prescriptions: BENTYL 10 MG CAPS (DICYCLOMINE HCL) Take 1 tablet by mouth two times a day to three times a day  #30 x 0   Entered by:   Lamona Curl CMA (AAMA)   Authorized by:   Hart Carwin MD   Signed by:   Lamona Curl CMA (AAMA) on 02/17/2010   Method used:   Electronically to        [facility] #[id number]* (retail)       [address] [place], [place]  [address]       Ph:  [phone]       Fax: [phone]   RxID:   [id number]   Appended Document: Triage Patient states he is doing much better since taking Bentyl and is no longer having problems.

## 2010-04-19 NOTE — Progress Notes (Signed)
Summary: TRIAGE-Worsening abd. pain  Phone Note Call from Patient Call back at Home Phone [phone]   Call For: DR Geralda Baumgardner Reason for Call: Talk to Nurse Summary of Call: Just got out of hospital and has a question for nurse.  Initial call taken by: Leanor Kail [facility],  May 03, 2009 11:40 AM  Follow-up for Phone Call        Pt. was admitted for diverticulitis on 04-30-09, was discharged yesterday.  Had a small BM this morning, had a large, loose BM yesterday in the hospital, also had a BM the day before, but thinks he may be constipated. Pt. is calling with c/o worsening LLQ pain, Is currently taking Cipro/Flagyl. Denies n/v, fever, blood.   DR.Deaire Mcwhirter PLEASE ADVISE  Follow-up by: Laureen Ochs LPN,  May 03, 2009 11:55 AM  Additional Follow-up for Phone Call Additional follow up Details #1::        stay on liquids, start Bentyl 20  by mouth three times a day, #30, 1 refill. Additional Follow-up by: Hart Carwin MD,  May 03, 2009 1:18 PM    Additional Follow-up for Phone Call Additional follow up Details #2::    Message left for patient to callback. Med. to pharmacy. Laureen Ochs LPN  May 03, 2009 2:14 PM  pt. called and left a msg. for me to call him. #[phone] rings busy,still. I left a message on [phone] with above MD instructions, If symptoms become worse call back immediately or go to ER, call me to confirm he got instructions. Pt. instructed to call back as needed.  Follow-up by: Laureen Ochs LPN,  May 03, 2009 4:21 PM  Additional Follow-up for Phone Call Additional follow up Details #3:: Details for Additional Follow-up Action Taken: Pt. states he is feeling better this morning, but feels he may be getting constipated. He may use Miralax 17gm mixed in 8oz. water or juice-daily.May use BID PRN. Pt. to keep scheduled office visit on 05-18-09 at 11:15am. Pt. instructed to call back as needed.   Additional Follow-up by: Laureen Ochs LPN,   May 04, 2009 9:01 AM  New/Updated Medications: BENTYL 20 MG  TABS (DICYCLOMINE HCL) Take one by mouth 3 times daily as needed for abd.pain. Prescriptions: BENTYL 20 MG  TABS (DICYCLOMINE HCL) Take one by mouth 3 times daily as needed for abd.pain.  #30 x 1   Entered by:   Laureen Ochs LPN   Authorized by:   Hart Carwin MD   Signed by:   Laureen Ochs LPN on 21/30/8657   Method used:   Electronically to        [facility] [address] #[id number]* (retail)       [address] [place], [place]  [address]       Ph: [phone]       Fax: [phone]   RxID:   [id number]

## 2010-04-19 NOTE — Letter (Signed)
Summary: [facility]   Imported By: Sherian Rein 01/14/2010 14:31:45  _____________________________________________________________________  External Attachment:    Type:   Image     Comment:   External Document

## 2010-04-20 NOTE — Op Note (Signed)
NAME:  [name], [name] NO.:  [id number]  MEDICAL RECORD NO.:  [id number]           PATIENT TYPE:  LOCATION:                                 FACILITY:  PHYSICIAN:  Burnard Bunting, M.D.    DATE OF BIRTH:  [birth year]  DATE OF PROCEDURE: DATE OF DISCHARGE:                              OPERATIVE REPORT   PREOPERATIVE DIAGNOSIS:  Left knee arthritis.  POSTOPERATIVE DIAGNOSIS:  Left knee arthritis.  PROCEDURE:  Left total knee replacement using a DePuy components, rotating platform, posterior cruciate sacrificing, 5 femur, 6 tibia, 12.5 poly, 41 patella.  SURGEON:  Burnard Bunting, MD  ASSISTANT:  Lucas Neighbors, PA  ANESTHESIA:  General endotracheal.  ESTIMATED BLOOD LOSS:  Minimal.  INDICATIONS:  Lucas Morales is a 77 year old patient's with left knee arthritis who presents for left total knee replacement after explanation of risks and benefits.  PROCEDURE IN DETAIL:  The patient was brought to the operating room where general endotracheal anesthesia was induced.  Preoperative IV antibiotics administered.  Left leg foot and ankle were prescrubbed with alcohol and Betadine, which allowed to air dry, prepped with DuraPrep solution and draped in a sterile manner.  Lucas Morales was used to cover the operative field.  Time-out was called.  Leg was elevated and exsanguinated via the Esmarch.  Tourniquet was inflated.  The incision on the anterior portion of the knee was made.  Median parapatellar approach was made and marked with #1 Vicryl suture.  The lateral patellofemoral ligament was released.  Fat pad was partially excised. Osteophytes were removed.  Soft tissue elevation was performed off the anteromedial aspect of the tibia because of the patient's significant preop varus deformity.  Tissue was removed from the anterior distal femur.  The pins were then placed in the anterior distal femur and proximal medial tibia.  Registration points, hips in  rotation, bimalleolar axis, and various points around the knee were obtained.  The tibial cut was then made.  Perpendicular mechanical axis had to be recut because of very hard posteromedially.  This was a second additional 2-mm cut, and posterior neurovascular structures and collateral ligaments were protected.  The tensioner was placed in extension and flexion.  The distal cut was then made.  Spacer block was used to check full extension.  An additional 2-mm cut had to be made because of inability to achieve full extension with a 10 spacer.  At this time, the femur was sized to a 5, anterior, posterior, and chamfer cuts were made, box cut was made.  Tibia was then keel punched and prepared, meniscal remnants were resected, PCL remnants were also elevated posteriorly along with posterior osteophytes with trials.  The patella was then prepared in freehand fashion.  It was then measured 30 mm in thickness, about a 10- 12 mm resection was performed.  At this time, the patellar trial was placed with the tendon 12 spacer.  The patient achieved both full extension, full flexion, and excellent patellar tracking with no-thumbs technique.  The patient had a little bit more stability to varus stress because of the elongation of the lateral structures with  a 12.5 spacer. Trial components were removed.  True components were placed.  Re- trialing again was performed with a 10 and 12.5 spacers and the 12.5 spacer gave about 1.5 degrees of flexion but had stability in both full extension and 90 degrees of flexion and that was the one that was chosen.  Tourniquet was released.  Bleeding points encountered and controlled with electrocautery after thorough irrigation with pulsatile solution and pouring solution.  Incision was then closed over a bolster with a Hemovac drain in place using interrupted inverted #1 Vicryl suture, 0 Vicryl suture, 2-0 Vicryl suture, and skin staples.  Incisions for the pins  were closed after irrigation using 3-0 nylon suture. Solution of Marcaine and clonidine was injected into the knee.  The patient tolerated the procedure well without immediate complications. He tolerated procedure well without immediate complications, transferred to recovery room in stable condition.  Lucas Morales's assistance was required at all times during the case for opening, closing, retraction of neurovascular structures, and drilling the pins and making the cuts. Her assistance was a medical necessity.     Burnard Bunting, M.D.     GSD/MEDQ  D:  04/12/2010  T:  04/13/2010  Job:  [id number]  Electronically Signed by Reece Agar.  [name] M.D. on 04/20/2010 03:43:44 PM

## 2010-04-21 NOTE — Assessment & Plan Note (Signed)
Summary: F/U from seeing Paula-rs   History of Present Illness Visit Type: Follow-up Visit Primary GI MD: [name] MD Primary Provider: Merri Brunette, MD Chief Complaint: Nausea after meals History of Present Illness:   This is a 77 year old white male with a recent episode of diverticulitis. This was evaluated by Willette Cluster, NP in 01/2010  and treated with  Cipro and Flagyl for 10 days with complete resolution of his symptoms. He is currently asymptomatic. He has a history of adenomatous polyps of the colon and is status post right hemicolectomy in 2006. His last colonoscopy was in December 2009. He will be due for a repeat colonoscopy in December 2012. He has sever IC. He is awaiting TKR by Dr dean.   GI Review of Systems    Reports nausea.      Denies abdominal pain, acid reflux, belching, bloating, chest pain, dysphagia with liquids, dysphagia with solids, heartburn, loss of appetite, vomiting, vomiting blood, weight loss, and  weight gain.        Denies anal fissure, black tarry stools, change in bowel habit, constipation, diarrhea, diverticulosis, fecal incontinence, heme positive stool, hemorrhoids, irritable bowel syndrome, jaundice, light color stool, liver problems, rectal bleeding, and  rectal pain.    Current Medications (verified): 1)  Imodium A-D 2 Mg Tabs (Loperamide Hcl) .... Take As Needed Diarrhea 2)  Daily-Vitamin  Tabs (Multiple Vitamin) .[name].. 1 Tablet By Mouth Once Daily 3)  Oxycontin 20 Mg Xr12h-Tab (Oxycodone Hcl) .... Two Times A Day 4)  Tums Calcium For Life Bone 750 Mg Chew (Calcium Carbonate Antacid) .... Take 4 Tablets Daily 5)  Omeprazole 20 Mg Cpdr (Omeprazole) .[name].. 1 By Mouth Once Daily 6)  Allopurinol 100 Mg Tabs (Allopurinol) .[name].. 1 By Mouth Once Daily 7)  Bentyl 10 Mg Caps (Dicyclomine Hcl) .... Take 1 Tablet By Mouth Two Times A Day To Three Times A Day 8)  Desmopressin Acetate 0.2 Mg Tabs (Desmopressin Acetate) .... At Bedtime 9)  Fiberchoice 2 Gm  Chew (Inulin) .... Once Daily 10)  Naproxen Dr 500 Mg Tbec (Naproxen) .... Take A Dose of 3 Tablets For Major Gout Flares or Severe Knee Pain Only 11)  Lexapro 10 Mg Tabs (Escitalopram Oxalate) .... Once Daily  Allergies (verified): 1)  ! Morphine  Past History:  Past Medical History: Reviewed history from 04/30/2009 and no changes required. Current Problems:  COLONIC POLYPS, ADENOMATOUS, HX OF (ICD-V12.72) HEMORRHOIDS (ICD-455.6) DIVERTICULOSIS, COLON (ICD-562.10) GASTRITIS (ICD-535.50) GERD (ICD-530.81) INTERSTITIAL CYSTITIS (ICD-595.1) Hx of ESOPHAGEAL STRICTURE (ICD-530.3) HIATAL HERNIA (ICD-553.3)  Diverticulitis  Past Surgical History: Reviewed history from 02/04/2010 and no changes required. Elbow Reconstruction Knee Replacement Benign Bladder lesion removed Exploratory Laparotomy with Right Colectomy Cholecystectomy Appendectomy Back surgery  Family History: Reviewed history from 02/04/2010 and no changes required. No FH of Colon Cancer: Family History of Heart Disease: mother died with massive heart attack  Social History: Reviewed history from 09/07/2008 and no changes required. Alcohol Use - yes-socially,rarely Illicit Drug Use - no Occupation: retired Patient has never smoked.   Review of Systems       The patient complains of arthritis/joint pain and swelling of feet/legs.  The patient denies allergy/sinus, anemia, anxiety-new, back pain, blood in urine, breast changes/lumps, change in vision, confusion, cough, coughing up blood, depression-new, fainting, fatigue, fever, headaches-new, hearing problems, heart murmur, heart rhythm changes, itching, menstrual pain, muscle pains/cramps, night sweats, nosebleeds, pregnancy symptoms, shortness of breath, skin rash, sleeping problems, sore throat, swollen lymph glands, thirst - excessive ,  urination - excessive , urination changes/pain, urine leakage, vision changes, and voice change.         Pertinent positive  and negative review of systems were noted in the above HPI. All other ROS was otherwise negative.   Vital Signs:  Patient profile:   77 year old male Height:      76 inches Weight:      239.13 pounds BMI:     29.21 Pulse rate:   84 / minute Pulse rhythm:   regular BP sitting:   122 / 68  (left arm) Cuff size:   regular  Vitals Entered By: June McMurray CMA Duncan Dull) (March 22, 2010 10:35 AM)  Physical Exam  General:  Well developed, well nourished, no acute distress. Mouth:  No deformity or lesions, dentition normal. Lungs:  Clear throughout to auscultation. Heart:  Regular rate and rhythm; no murmurs, rubs,  or bruits. Abdomen:  Soft, nontender and nondistended. No masses, hepatosplenomegaly or hernias noted. Normal bowel sounds. Msk:  discoloration of the third toe on the right foot, most likely traumatic. Skin:  Intact without significant lesions or rashes. Psych:  Alert and cooperative. Normal mood and affect.   Impression & Recommendations:  Problem # 1:  DIVERTICULOSIS, COLON (ICD-562.10) Patient is status post diverticulitis with complete resolution on Cipro and Flagyl. There was no CT scan completed. He will stay on a high-fiber diet. Because of his extensive travel abroad, I will send him Cipro 500 mg one twice a day, dispense 20 so he can take it while traveling as needed.   Problem # 2:  COLONIC POLYPS, ADENOMATOUS, HX OF (ICD-V12.72) Patient is status post right hemicolectomy for carpeted polyp of the right colon. A epeat colonoscopy will be due in December 2012.  Problem # 3:  GERD (ICD-530.81) Patient is on omeprazole 20 mg a day. We may increase to 2 a day p.r.n.  Problem # 4:  INTERSTITIAL CYSTITIS (ICD-595.1) Patient is currently controlled on OxyContin 20 mg twice a day.  Patient Instructions: 1)  Please pick up your prescriptions at the pharmacy. Electronic prescription(s) has already been sent for tramadol and cipro. 2)  Copy sent to : Dr Merri Brunette 3)   recall colonoscopy December 2012 4)  The medication list was reviewed and reconciled.  All changed / newly prescribed medications were explained.  A complete medication list was provided to the patient / caregiver. Prescriptions: TRAMADOL HCL 50 MG TABS (TRAMADOL HCL) Take 1 tablet by mouth two times a day as needed for pain  #60 x 0   Entered by:   Lamona Curl CMA (AAMA)   Authorized by:   Hart Carwin MD   Signed by:   Lamona Curl CMA (AAMA) on 03/22/2010   Method used:   Electronically to        [facility] [address] #[id number]* (retail)       [address] [place], [place]  [address]       Ph: [phone]       Fax: [phone]   RxID:   [id number] CIPRO 500 MG TABS (CIPROFLOXACIN HCL) Take 1 tablet by mouth two times a day  #20 x 0   Entered by:   Lamona Curl CMA (AAMA)   Authorized by:   Hart Carwin MD   Signed by:   Lamona Curl CMA (AAMA) on 03/22/2010   Method used:   Electronically to        [facility]  [address] [id number]* (retail)       [address] [place], [place]  [address]       Ph: [phone]       Fax: [phone]   RxID:   [id number]

## 2010-05-09 ENCOUNTER — Other Ambulatory Visit: Payer: Self-pay | Admitting: Orthopedic Surgery

## 2010-05-09 DIAGNOSIS — R609 Edema, unspecified: Secondary | ICD-10-CM

## 2010-05-09 DIAGNOSIS — R52 Pain, unspecified: Secondary | ICD-10-CM

## 2010-05-10 ENCOUNTER — Ambulatory Visit
Admission: RE | Admit: 2010-05-10 | Discharge: 2010-05-10 | Disposition: A | Discharge status: Home / Self Care | Payer: Medicare Other | Source: Ambulatory Visit | Attending: Orthopedic Surgery | Admitting: Orthopedic Surgery

## 2010-05-10 DIAGNOSIS — R609 Edema, unspecified: Secondary | ICD-10-CM

## 2010-05-10 DIAGNOSIS — R52 Pain, unspecified: Secondary | ICD-10-CM

## 2010-06-01 LAB — CBC
HCT: 39.5 % (ref 39.0–52.0)
Hemoglobin: 13.7 g/dL (ref 13.0–17.0)
MCV: 90.4 fL (ref 78.0–100.0)
RDW: 14.3 % (ref 11.5–15.5)
WBC: 5.5 10*3/uL (ref 4.0–10.5)

## 2010-06-01 LAB — COMPREHENSIVE METABOLIC PANEL
Alkaline Phosphatase: 54 U/L (ref 39–117)
BUN: 11 mg/dL (ref 6–23)
CO2: 28 mEq/L (ref 19–32)
Chloride: 108 mEq/L (ref 96–112)
GFR calc non Af Amer: >60 mL/min (ref >60)
Glucose, Bld: 93 mg/dL (ref 70–99)
Potassium: 4.2 mEq/L (ref 3.5–5.1)
Total Bilirubin: 0.8 mg/dL (ref 0.3–1.2)

## 2010-06-01 LAB — URINALYSIS, ROUTINE W REFLEX MICROSCOPIC
Bilirubin Urine: NEGATIVE
Hgb urine dipstick: NEGATIVE
Ketones, ur: NEGATIVE mg/dL
Nitrite: NEGATIVE
Urobilinogen, UA: 0.2 mg/dL (ref 0.0–1.0)

## 2010-06-01 LAB — SURGICAL PCR SCREEN
MRSA, PCR: NEGATIVE
Staphylococcus aureus: NEGATIVE

## 2010-06-08 LAB — CBC
HCT: 33.7 % — ABNORMAL LOW (ref 39.0–52.0)
HCT: 36.7 % — ABNORMAL LOW (ref 39.0–52.0)
MCHC: 35.2 g/dL (ref 30.0–36.0)
MCV: 94.1 fL (ref 78.0–100.0)
MCV: 94.6 fL (ref 78.0–100.0)
Platelets: 305 10*3/uL (ref 150–400)
RBC: 3.58 MIL/uL — ABNORMAL LOW (ref 4.22–5.81)
RBC: 3.88 MIL/uL — ABNORMAL LOW (ref 4.22–5.81)
WBC: 5.9 10*3/uL (ref 4.0–10.5)

## 2010-06-08 LAB — COMPREHENSIVE METABOLIC PANEL
ALT: 24 U/L (ref 0–53)
Albumin: 2.9 g/dL — ABNORMAL LOW (ref 3.5–5.2)
Alkaline Phosphatase: 92 U/L (ref 39–117)
CO2: 22 mEq/L (ref 19–32)
Chloride: 105 mEq/L (ref 96–112)
GFR calc Af Amer: >60 mL/min (ref >60)
Total Protein: 6.3 g/dL (ref 6.0–8.3)

## 2010-06-08 LAB — DIFFERENTIAL
Eosinophils Absolute: 0.1 10*3/uL (ref 0.0–0.7)
Eosinophils Relative: 2 % (ref 0–5)
Lymphs Abs: 1.9 10*3/uL (ref 0.7–4.0)
Monocytes Absolute: 0.3 10*3/uL (ref 0.1–1.0)
Monocytes Relative: 6 % (ref 3–12)

## 2010-06-08 LAB — URINALYSIS, ROUTINE W REFLEX MICROSCOPIC
Bilirubin Urine: NEGATIVE
Glucose, UA: NEGATIVE mg/dL
Hgb urine dipstick: NEGATIVE
Protein, ur: NEGATIVE mg/dL
Urobilinogen, UA: 1 mg/dL (ref 0.0–1.0)

## 2010-06-08 LAB — SEDIMENTATION RATE: Sed Rate: 97 mm/hr — ABNORMAL HIGH (ref 0–16)

## 2010-06-16 NOTE — Discharge Summary (Signed)
  NAME:  Lucas Morales, Lucas Morales NO.:  [id number]  MEDICAL RECORD NO.:  [id number]          PATIENT TYPE:  INP  LOCATION:  5024                         FACILITY:  [facility]  PHYSICIAN:  Burnard Bunting, M.D.    DATE OF BIRTH:  1933-12-10  DATE OF ADMISSION:  04/12/2010 DATE OF DISCHARGE:  04/17/2010                              DISCHARGE SUMMARY   DISCHARGE DIAGNOSIS:  Left knee arthritis.  SECONDARY DIAGNOSES: 1. History of interstitial cystitis. 2. History of lumbar spinal stenosis. 3. History of arthritis. 4. History of gastroesophageal reflux disease and gout.  OPERATION AND NOTABLE PROCEDURES:  Left total knee replacement performed on April 12, 2010.  HOSPITAL COURSE:  Lucas Morales is a 77 year old patient with left knee arthritis, underwent left total knee replacement on April 13, 2010.  He tolerated procedure well.  Started on IV and p.o. pain medicines.  He was on the significant pain medicine preoperatively due to pain at the knee as well as pain at the interstitial cystitis.  The patient was started on Coumadin for DVT prophylaxis.  Hemoglobin was 10 on postop day #1.  He mobilized reasonably well, but albeit slowly with physical therapy.  Pain medicine was optimized by the time of discharge. The patient otherwise had an unremarkable recovery.  Pain was controlled on __________ oral pain medicine by the time of discharge.  He was discharged home in good condition on April 17, 2010, with home health PT, Coumadin for DVT prophylaxis, ambulating and weightbearing as tolerated.  DISCHARGE MEDICATIONS: 1. Fiber Choice p.o. 4 g daily. 2. Vitamin D3 over the counter 1 tablet daily. 3. Dicyclomine 20 mg p.o. 3 times a day. 4. Omeprazole 20 mg p.o. daily. 5. Lexapro 10 mg one p.o. q.a.m. 6. Desmopressin 0.2 mg p.o. 1 tablet daily at bedtime. 7. Multivitamins 1 tablet daily. 8. Allopurinol 100 mg a day along with oxycodone extended release 30     mg p.o. 3  times a day with Percocet 10/325 one p.o. q.3 h p.r.n.     breakthrough pain. 9. Coumadin 5 mg p.o. daily to INR 2-2.5. 10.Robaxin 500 mg p.o. q.8 h p.r.n. spasm.  He will follow up with me in 7 days for suture inspection and harvest. He is discharged in good condition.     Burnard Bunting, M.D.     GSD/MEDQ  D:  05/18/2010  T:  05/18/2010  Job:  [id number]  Electronically Signed by Reece Agar.  DEAN M.D. on 06/16/2010 08:35:45 AM

## 2010-08-02 NOTE — Op Note (Signed)
NAME:  Lucas Morales, Lucas Morales NO.:  [id number]   MEDICAL RECORD NO.:  [id number]          PATIENT TYPE:  AMB   LOCATION:  [facility]                         FACILITY:  [facility]   PHYSICIAN:  Jamison Neighbor, M.D.  DATE OF BIRTH:  1933-07-03   DATE OF PROCEDURE:  09/17/2006  DATE OF DISCHARGE:                               OPERATIVE REPORT   PREOPERATIVE DIAGNOSIS:  Interstitial cystitis.   POSTOPERATIVE DIAGNOSIS:  Interstitial cystitis.   PROCEDURE:  Cystoscopy, hydrodistention of the bladder, Marcaine and  Pyridium instillation.   SURGEON:  Jamison Neighbor, M.D.   ANESTHESIA:  General.   COMPLICATIONS:  None.   DRAINS:  None.   BRIEF HISTORY:  This 77 year old male has severe urinary urgency,  frequency, and pelvic pain.  This has not been felt to be due urinary  tract infections, as he has no response to anticholinergics.  The  patient has no evidence of tumor and really no significant evidence of  bladder outlet obstruction.  This is felt to be consistent with  interstitial cystitis.  The patient was doing reasonably well on a  combination of Elmiron and Atarax as well as Elavil; however, his  situation has worsened lately.  He has more problems with nocturia, he  has more problems with fatigue, and feels that his pain is poorly  controlled.  He has requested that a repeat hydrodistention be  performed.  He understands that there may be experimental options that  we can consider but that he certainly has not responded particularly  well to standard oral therapy or to standard instillation therapy.  Full  informed consent was obtained for the procedure.   PROCEDURE:  After successful induction of general anesthesia, the  patient was placed in the dorsal position, prepped with Betadine, and  draped in the usual sterile fashion.   The cystoscope was inserted.  The urethra was visualized in its entirety  and was found to be normal.  No strictures or stones could be  seen.  Beyond the verumontanum, there was moderate prostatic enlargement but  certainly nothing unusual for a 77 year old.  The lateral lobes were  enlarged but did not necessarily meet in the midline.  The patient  really did not appear to have high-grade obstruction.  The bladder neck  was reasonably well opened.  The bladder was carefully inspected.  No  tumors or stones could be seen.  The ureteral orifices were normal.  The  patient did not have a lot of trabeculation within the bladder.  Hydrodistention of the bladder was performed.  The bladder was distended  at a pressure of 100 cm/H2O for 5 minutes.  When the bladder was  drained, terminal bleeding could be seen, modest granulations were  identified, no ulcers were seen.  The bladder capacity was generally  good at almost 900 mL.  The bladder was drained.  A mixture of Marcaine  and Pyridium was left in the bladder.   The patient tolerated the procedure and was taken to the recovery room  in good condition.  He will be sent hom with Lorcet Plus as well  as  antibiotic coverage.  He does have Pyridium Plus for burning.  He will  stay on his other medications and return to the office in followup.  We  will discuss several options for him, including the possibility of  trying to treat any bladder outlet obstruction or at minimum testing for  that with flow rate studies and urodynamics.  We will also consider the  possibility of neural modulation, and will consider him as a candidate  for one of the new drug trials.  In terms of standard therapy, the only  other consideration might be the use of Lyrica or Neurontin as an  adjunctive therapy for pain, but the concern that will need to be  addressed is his ongoing issues with fatigue which may be due side  effects from medications.      Jamison Neighbor, M.D.  Electronically Signed     RJE/MEDQ  D:  09/17/2006  T:  09/17/2006  Job:  [id number]   cc:   Soyla Murphy. Renne Crigler, M.D.  Fax:  [phone]

## 2010-08-05 NOTE — Op Note (Signed)
NAME:  Lucas Morales, Lucas Morales NO.:  [id number]   MEDICAL RECORD NO.:  [id number]          PATIENT TYPE:  INP   LOCATION:  1428                         FACILITY:  [facility]   PHYSICIAN:  Gita Kudo, M.D. DATE OF BIRTH:  Feb 25, 1934   DATE OF PROCEDURE:  DATE OF DISCHARGE:  02/06/2005                                 OPERATIVE REPORT   OPERATIVE PROCEDURE:  Laparotomy, right colectomy, cholecystectomy.   Cancel this dictation per dictator.           ______________________________  Gita Kudo, M.D.     MRL/MEDQ  D:  02/15/2005  T:  02/16/2005  Job:  [id number]   cc:   [name], M.D. [facility]  [address]  [place]  [place] [address]   [place] Service, M.D.  Fax: [phone]   Janae Bridgeman. Eloise Harman., M.D.  Fax: [phone]

## 2010-08-05 NOTE — Op Note (Signed)
NAME:  Lucas Morales, Lucas Morales NO.:  [id number]   MEDICAL RECORD NO.:  [id number]          PATIENT TYPE:  INP   LOCATION:  0003                         FACILITY:  [facility]   PHYSICIAN:  Gita Kudo, M.D. DATE OF BIRTH:  [birth year]   DATE OF PROCEDURE:  01/31/2005  DATE OF DISCHARGE:                                 OPERATIVE REPORT   ADDENDUM:  The operative procedure on [name] was performed on  January 31, 2005. I am dictating this addendum the next day.   During the laparotomy, I did note that the spleen was enlarged. This  correlated to his preop CT scan showing an enlarged spleen. It was palpated  carefully manually and visually inspected and it looked homogeneous,  diffusely enlarged without any specific abnormality. There was no sign of  enlarged veins in the upper abdomen or in the short gastric vessels.   I am doing this as an addendum for completeness of the operative note.           ______________________________  Gita Kudo, M.D.     MRL/MEDQ  D:  02/01/2005  T:  02/01/2005  Job:  [id number]

## 2010-08-05 NOTE — Op Note (Signed)
NAME:  Lucas Morales, Lucas Morales NO.:  [id number]   MEDICAL RECORD NO.:  [id number]          PATIENT TYPE:  AMB   LOCATION:  [facility]                         FACILITY:  [facility]   PHYSICIAN:  Jamison Neighbor, M.D.  DATE OF BIRTH:  1934-01-10   DATE OF PROCEDURE:  10/02/2005  DATE OF DISCHARGE:                                 OPERATIVE REPORT   PREOPERATIVE DIAGNOSIS:  Interstitial cystitis.   POSTOPERATIVE DIAGNOSIS:  Interstitial cystitis.   OPERATION PERFORMED:  Cystoscopy, urethral calibration, hydrodistention of  the bladder, Marcaine and Pyridium instillation, bladder biopsy with  cauterization.   SURGEON:  Jamison Neighbor, M.D.   ANESTHESIA:  General.   COMPLICATIONS:  None.   DRAINS:  None.   INDICATIONS FOR PROCEDURE:  This 77 year old male has had lower urinary  tract symptoms of urgency, frequency and pelvic pain for some time.  The  patient was first dilated by Dr. Wanda Plump, who treated him as if he had  chronic prostatitis.  At one point, that seemed to help somewhat but then  the symptoms returned and the patient clearly did not have an infectious  etiology.  The patient was extensively evaluated by Dr. Wanda Plump including  cystoscopy, retrogrades, bladder biopsies all of which  were negative.  He  was treated with alpha blockers and various anticholinergics along with  antidepressants.  He also had DMSO and a short trial of Elmiron.   The patient was subsequently referred to [facility] to see Dr. Gerre Pebbles, and then subsequently was transferred to the care of Dr. Gala Lewandowsky.  Dr. Gala Lewandowsky felt this was primarily a voiding dysfunction and recommended  that the patient do timed voiding and have an InterStim or possibly Botox.  He also suggested that the patient might require urinary diversion.   The only medication that actually helped the patient was Towanda Malkin he had been  given by Dr. Lendell Caprice.  The patient looked up some information concerning  that  drug and came across the idea that he might have interstitial cystitis.  He was in contact with the interstitial cystitis association and was told  that his symptoms certainly seemed to be consistent with that diagnosis.  The patient is now to undergo formal diagnostic testing to see if he  actually has interstitial cystitis and to make sure he has no other  abnormalities.  The patient understands the risks and benefits of the  procedure and gave full informed consent.   DESCRIPTION OF PROCEDURE:  After successful induction of general anesthesia,  the patient was placed in the dorsal lithotomy position, prepped with  Betadine and draped in the usual sterile fashion.  Cystoscopy was performed,  the urethra was visualized in its entirety and found to be normal.  Beyond  the verumontanum there was minimal prostatic enlargement but the lateral  lobes did not actually meet in the midline it really did not appear that  there was significant bladder outlet obstruction.  The bladder was carefully  inspected.  It was free of any tumor or stones.  Both ureteral orifices were  normal in configuration and location.  There  was no real trabeculation,  again suggesting that bladder outlet obstruction did not appear to be the  major cause of the patient's problem.  The bladder was distended at a  pressure of 100 cmH2O for five minutes.  When the bladder was drained, there  was bleeding throughout.  There was no active ulcers but glomerulations  could be identified.  There was a bladder capacity of only 600 mL which is  markedly diminished, with normal, at least in the male population being  1150 and the average for the IC population of 575.  The patient had a single  bladder biopsy done.  The biopsy site was cauterized, this was done not only  to rule out for carcinoma in situ but to check for mast cells.  A mixture of  Marcaine and Pyridium was left in the bladder.  The patient tolerated the  procedure  well and was taken to recovery room in good condition to continue  on his current medications, most importantly his Urelle.  We will continue  to treat him with a combination of Elmiron, Atarax and Lyrica and will also  start him on instillation therapy.  Going forward, we will look at the  possibility of instillation therapy and will also consider the possibility  of interstim to control his frequency.  At this point, however, the most  important thing is that we try to get his pain under control because until  that happens, his frequency will not improve.  He will be sent home with  Tylox and then will resume the use of his normal hydrocodone medications.           ______________________________  Jamison Neighbor, M.D.  Electronically Signed     RJE/MEDQ  D:  10/02/2005  T:  10/02/2005  Job:  [id number]   cc:   Janae Bridgeman. Eloise Harman., M.D.  Fax: [phone]

## 2010-08-05 NOTE — Discharge Summary (Signed)
NAME:  Lucas Morales, Lucas Morales NO.:  [id number]   MEDICAL RECORD NO.:  [id number]          PATIENT TYPE:  INP   LOCATION:  5007                         FACILITY:  [facility]   PHYSICIAN:  Burnard Bunting, M.D.    DATE OF BIRTH:  [birth year]   DATE OF ADMISSION:  03/01/2004  DATE OF DISCHARGE:  03/06/2004                                 DISCHARGE SUMMARY   DISCHARGE DIAGNOSIS:  Right knee arthritis.   SECONDARY DIAGNOSIS:  History of bladder incontinence.   OPERATIONS/PROCEDURES:  Right total knee replacement performed on March 01, 2004.   HOSPITAL COURSE:  The patient was admitted to the orthopedic service on  March 01, 2004. At that time the patient underwent left total knee  replacement. He tolerated the procedure well without immediate  complications. Hematocrit was 32 on postoperative day #1. He was started on  Coumadin for DVT prophylaxis. He was started on a CPM machine for range of  motion as well as physical therapy for mobilization. The patient had an  unremarkable recovery and incision was intact on postoperative day two. He  was discharged home on postoperative day #5 in good condition. He will  continue with home health PT and CPM. Discharge medications include  admission medications plus Percocet for pain, Coumadin for DVT prophylaxis,  and Robaxin for muscle spasm. He will follow up with me in a week for suture  removal.      GSD/MEDQ  D:  05/11/2004  T:  05/11/2004  Job:  [id number]

## 2010-08-05 NOTE — Op Note (Signed)
NAME:  DUSTY, RACZKOWSKI                     ACCOUNT NO.:  [id number]   MEDICAL RECORD NO.:  [id number]                   PATIENT TYPE:  AMB   LOCATION:  [facility]                                 FACILITY:  [facility]   PHYSICIAN:  Boston Service, M.D.             DATE OF BIRTH:  12/31/1933   DATE OF PROCEDURE:  05/22/2003  DATE OF DISCHARGE:                                 OPERATIVE REPORT   LOCAL MEDICAL DOCTOR:  Higinio Plan, M.D.   UROLOGIST:  [place] Service, M.D.   PREOPERATIVE DIAGNOSIS:  Microhematuria, erythematous patch anterior bladder  wall with history of pelvic pain refractory to appropriate antibiotic  therapy.   INDICATIONS:  The patient first seen in our office February 2005, on  referral from Dr. Lendell Caprice.  BUN 17, creatinine 1.3, PSA 1.1.  Empirical  trial of , Flomax, and UroXatral provided no significant symptomatic relief  for patient.  Follow-up fiberoptic cystoscopy showed an erythematous patch  along the bladder dome.  No other evidence of stone or bleeding site.  Clear  efflux right and left ureteral orifice.  CT scan of the abdomen and pelvis,  May 13, 2003, thickening of the anterior-superior left bladder wall up  to 13 mm.  Diverticula are noted within the sigmoid and lower left colon  without inflammation, abscess, or perforation.  There are degenerative  changes throughout the lumbar spine.   DESCRIPTION OF PROCEDURE:  The patient was prepped and draped in the dorsal  lithotomy position after institution of an adequate level of spinal  anesthesia.  Well-lubricated 22 French panendoscope was gently inserted at  the urethral meatus.  Normal urethra and sphincter.  Normal trigone and  orifices.  Prostatic urethra showed regrowth of prostatic tissue, mainly  from the anterior lobe of the prostate with extension into the bladder.  Continuous-flow resectoscope sheath was then inserted, and anterior tissue  was then carefully resected between the 11  o'clock and 1 o'clock position  anteriorly, starting at the tip of the knob which projected into the bladder  and gradually working back to the bladder neck.  A small amount  nonobstructive regrowth was resected between the 2 o'clock and 4 o'clock  position as well as between the 10 o'clock and 8 o'clock position.  No  capsular perforations were noted.  No attempt was made to resect tissue  between 4 and 8 o'clock.  Once all resectable tissue had been removed, loop  was withdrawn.  Chips were irrigated free from the bladder.  VaporTrode  element was used to establish hemostasis between the 4 o'clock and 12  o'clock position on the left lobe and in between 8 o'clock and 12 o'clock  position on the right lobe.  The bladder was filled to capacity;  resectoscope sheath was withdrawn.  A 24 French three-way Foley was inserted  and left to light traction.  The patient was returned to recovery in  satisfactory condition.   POSTOPERATIVE DIAGNOSIS:  Microhematuria, erythematous patch anterior  bladder wall with history of pelvic pain refractory to appropriate  antibiotic therapy.   PROCEDURE:  1. Cystoscopy.  2. Retrogrades.  3. TURBT (small).   ANESTHESIA:  General.   DRAINS:  22 French Foley.   DESCRIPTION OF PROCEDURE:  The patient was prepped and draped in the dorsal  lithotomy position after institution of an adequate level of general  anesthesia.  Well-lubricated 21 French panendoscope was gently inserted at  the urethral meatus.  Normal urethra and sphincter.  Nonobstructive  prostate.  Careful inspection of the bladder showed clear efflux right and  left ureteral orifice.  Right and left retrograde showed no evidence of  filling defect or obstruction.  The patient had mild physiologic narrowing  of the ureter as it crossed the femoral vessels.  Both sides showed prompt  drainage at 3-5 minutes.  Photographs of the erythematous patch along the  bladder dome were obtained.   Suspicious area was then resected between the  12 o'clock and 1 o'clock position, taking care to avoid bladder perforation.  Once adequate sampling of the urothelium and underlying muscle had been  obtained, loop was removed, and VaporTrode element was inserted.  Adequate  hemostasis was obtained.  Resectoscope sheath was withdrawn.  A 22 French  Foley was inserted.  Bladder was then patiently irrigated with sterile water  for a period of about 20 minutes.  Foley was left to straight drain.  The  patient was given 30 mg of Toradol and a B&O suppository and returned to  recovery in satisfactory condition.                                               Boston Service, M.D.    RH/MEDQ  D:  05/22/2003  T:  05/22/2003  Job:  [id number]   cc:   Janae Bridgeman. Eloise Harman., M.D.  [address]  [place]  [place] [address]  Fax: [phone]

## 2010-08-05 NOTE — Assessment & Plan Note (Signed)
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 Shon Hale on January 31, 2005.  He also had an incidental cholecystectomy for cholelithiasis.  He has  recovered well from surgery and had a follow up appointment with Dr.  Lendell Caprice who found heme positive stool on stool cards. The patient himself  denies any rectal bleeding. His bowel habits have changed in that he is  having more than one bowel movement a day, usually a formed stool in the  morning and a rather loose stool in the afternoon.  He denies any abdominal  pain.   MEDICATIONS:  1. Aspirin 81 p.o. daily which was discontinued 5 weeks ago.  2. Lyrica.  3. Multivitamin.  4. Allopurinol.  5. Enduron.  6. Aciphex 20 mg p.o. daily.  7. Avodart.   He had an upper endoscopy in May, 2006 by Dr. Corinda Gubler with finding of  gastritis.  He had a last colonoscopy prior to his surgery when we tattooed  the polyp before the resection.   PHYSICAL EXAMINATION:  VITAL SIGNS:  Blood pressure 136/80, pulse 68. Weight  252 pounds which represents a 15 pound weight gain since last appointment in  November.  NEURO:  Alert and oriented, in no distress.  LUNGS:  Clear to auscultation.  CARDIAC:  Normal S2.  ABDOMEN:  Soft, nontender with normal active bowel sounds. Well-healed  midline surgical scar. Right lower quadrant was normal. Liver edge at the  costal margin.  RECTAL EXAM:  Showed initially no stool in the right colon.  Repeat exam  showed a small amount of heme positive stool in the rectal ampulla.  EXTREMITIES:  No edema.   IMPRESSION:  A 77 year old gentleman with heme positive stool status post  right  hemicolectomy for benign lesion. The heme positive stool may be due to  granulation tissue at the anastomosis. Possibly due to rectal source, due to  diarrhea or this could also be an upper GI source. The patient has a history  of gastritis and gastroesophageal reflux for which he takes Aciphex.  Although he has stopped taking aspirin, he may still have some GI blood  loss. We need to rule out the possibility of AVM which is less likely. He  has never had history of any problem with GI bleeding.   PLAN:  1. Upper endoscopy scheduled for Monday, November 13, 2005.  2. Stay off aspirin for this time.  3. Continue Aciphex 200 mg p.o. daily.   Depending on the upper endoscopy, he may need repeat colonoscopy within a  year of his surgery which would be the end of November of 2007.  [name]. Juanda Chance, MD   DMB/MedQ  DD:  11/10/2005  DT:  11/10/2005  Job #:  [id number]   cc:   Ulyess Mort, MD  Janae Bridgeman Eloise Harman., MD

## 2010-08-05 NOTE — Op Note (Signed)
NAME:  DEAUNTE, DENTE NO.:  [id number]   MEDICAL RECORD NO.:  [id number]          PATIENT TYPE:  INP   LOCATION:  5007                         FACILITY:  [facility]   PHYSICIAN:  Burnard Bunting, M.D.    DATE OF BIRTH:  05/09/1933   DATE OF PROCEDURE:  03/01/2004  DATE OF DISCHARGE:                                 OPERATIVE REPORT   PREOPERATIVE DIAGNOSIS:  Right knee osteoarthritis and varus deformity.   POSTOPERATIVE DIAGNOSIS:  Right knee osteoarthritis and varus deformity.   PROCEDURE:  Right total knee replacement.   SURGEON:  Burnard Bunting, M.D.   ASSISTANT:  Jerolyn Shin. Tresa Res, M.D.   ANESTHESIA:  General endotracheal.   ESTIMATED BLOOD LOSS:  150 cc.   DRAINS:  Hemovac x1.   COMPONENTS:  Cemented Osteonics 11 femur, 11 tibia, 9 patella, 10-mm  polyethylene insert.   TOURNIQUET TIME:  Two hours and 15 minutes at 300 mmHg.   PROCEDURE IN DETAIL:  The patient was brought to the operating room where  general endotracheal anesthesia was induced.  Preoperative IV antibiotic  were administered. The right leg was prescrubbed with Betadine and allowed  to air dry and then prepped with DuraPrep solution and draped in a sterile  manner.  __________ operative field.  At this time, two pins were placed  into the mid-shaft of the femur.  Incision was made, and blunt dissection  was then performed down to the shaft of the femur.  Bicortical fixation was  achieved.  In a similar manner, in the mid-shaft of the tibia, two pins were  placed.  The OrthoLoc apparatus for navigation was then affixed to the  tibial pins.  At this time, the leg was elevated and exsanguinated with the  Esmarch wrap, and the tourniquet was inflated.  An anterior approach to the  knee was utilized.  The skin and subcutaneous tissue were sharply divided.  A median parapatellar approach was made.  The arthrotomy was marked with a 0  Vicryl suture.  The patella was everted.  The lateral  patellofemoral  ligament was released.  At this time, anatomy survey was performed with  navigational protocol. The hip center rotation was established.  Medial and  lateral condyles were established.  Medial and lateral femoral condyle at  that point were entered.  Data entry for the medial and lateral tibial  plateau was then performed.  Data entry for the medial and lateral malleolus  was then performed.  Following data entry, the fat pad was partially  excised.  The periosteal sleeve of tissue on the medial side was developed  back to its non-membranous bursa.  Because of the significant varus  deformity, the dissection of the superficial medial collateral ligament and  deep medial collateral ligament was performed distally for the superficial  collateral ligament approximately 8 cm and circumferentially the tibial  plateau for the deep medial collateral ligament.  At this time,  intramedullary alignment was utilized to make the distal femoral cut.  An 11-  mm resection was made which was checked with navigation.  The external  rotation alignment  jig was then placed for the anterior chamfer cut.  These  cuts were then performed perpendicular to the mechanical axis of the right  lower extremity.  At this time, a leveling cut on the tibia was made, and  the ACL and PCL were released.  The tibia was then cut perpendicular to the  mechanical axis of the leg.  Approximately 2 mm was resected off the most  affected medial tibial plateau.  In addition to navigation, this was checked  with the external alignment rod and was found to be perpendicular to the  mechanical axis of the tibia and parallel to the tibial shaft.  Following  resection, a box cut was made on the femur.  He was taken through a range of  motion with trial components in place and found to be in 0 degrees of varus  and valgus and had an excellent range of motion with 3 degrees of  hyperextension to 125 of flexion.  At this  time, the rotation of the tibial  component was marked.  The re-cut on the tibial slope was performed to add 5  degrees of slope to the tibial plateau surface.  The patella was then  resected from the 28-mm patella down 15 mm.  Medial offset dome patella was  the utilized.  With the trial components in place, the patella was found to  have excellent tracking.  The knee had good stability to varus and valgus  stress.  Although to varus stress, it did have some slight laxity due to the  preoperative varus alignment.  The knee range of motion parameters were  significantly improved on the navigational system compared to preoperative  values.  At this time, a keel punch was performed on the tibia.  The  components were then removed and sub-bony surfaces were irrigated.  The PCL  remnant was excised.  Minimal osteophytes were removed from the posterior  femoral condyle.  The components were then cemented into position, and  excess cement was removed.  The knee was held in extension while cement  hardening occurred.  The knee was again taken through a range of motion and  found to have excellent range of motion, excellent patellar tracking with no-  thumb's technique.  The tourniquet was released.  Bleeders encountered were  controlled using electrocautery.  A Hemovac drain was placed.  The wound was  then closed over bolster using interrupted inverted #1 Vicryl suture to  reapproximate the arthrotomy, followed by interrupted inverted #2 Vicryl  sutures, and skin staples to reapproximate the skin edges.  The two guide  pins were removed from the femur, and those incisions were irrigated and  closed using 3-0 nylon suture.  The patient was then wrapped, put in a bulky  dressing.  Pedal pulses were palpable at the conclusion of the case  He  tolerated the procedure well without immediate complications.       GSD/MEDQ  D:  03/03/2004  T:  03/03/2004  Job:  [id number]

## 2010-08-05 NOTE — Discharge Summary (Signed)
NAME:  Lucas Morales, Lucas Morales NO.:  [id number]   MEDICAL RECORD NO.:  [id number]          PATIENT TYPE:  INP   LOCATION:  1428                         FACILITY:  [facility]   PHYSICIAN:  Gita Kudo, M.D. DATE OF BIRTH:  12-29-1933   DATE OF ADMISSION:  01/31/2005  DATE OF DISCHARGE:  02/06/2005                                 DISCHARGE SUMMARY   CHIEF COMPLAINT:  Right colon polyps, gallbladder, multiple urinary  symptoms.   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  He also has gallstones, history of  diverticulosis, a lot of urinary symptoms and possible bladder irritation  from diverticular disease.   HOSPITAL COURSE:  On the morning of admission, having had a good outpatient  bowel prep and colonoscopy by Dr. Juanda Chance marking the area, he underwent an  open exploratory laparotomy, right colectomy (conservative).  He had a  cholecystectomy, also.  Postoperatively, he had difficulty voiding and  required catheterization.  He actually was doing quite well and was  maintained on IV fluids, started with liquid diet and was moderately  comfortable.  Over several days, he regained bowel function, oxygen was  discontinued.  He began having bowel movements and was tolerating a diet.  On November 20, his sixth postoperative day, he was discharged.   LABORATORY STUDIES:  The CBC showed hemoglobin 15, hematocrit 43, white  count 4700.  CMET was normal with a slight elevation of his glucose of 110.  CEA normal at 112.  He was A positive and did not require any blood  transfusions and his urinalysis was negative.  His pathology report showed  cholecystitis, cholelithiasis.  There was no malignancy and the polyp was a  tubulovillous adenoma.  The tumor size was about 3 cm.   FOLLOW UP:  He will be followed up as an outpatient.   DISCHARGE DIAGNOSES:  1.  Tubulovillous adenoma right colon.  2.   Cholecystitis, cholelithiasis.   PROCEDURES:  1.  Right colectomy.  2.  Cholecystectomy.   COMPLICATIONS:  Infections.   CONSULTATIONS:  None.   CONDITION ON DISCHARGE:  Good.          ______________________________  Gita Kudo, M.D.    MRL/MEDQ  D:  02/15/2005  T:  02/16/2005  Job:  [id number]

## 2010-08-05 NOTE — Assessment & Plan Note (Signed)
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 Shon Hale in November 2006.  He continued to be heme  positive, although, not anemic.  GI evaluation was necessary to assess  the origin of his GI blood loss.  Upper endoscopy in August 2007 showed  hiatal hernia with mild esophageal stricture.  Repeat hemoccults were  then positive, so, we had proceeded with another colonoscopy on March 15, 2006, with findings of a polyp distal to the anastomosis which  appeared multilobulated and likely adenomatous, but the pathology showed  this to be  a pseudopolyp consisting of inflammatory tissue.  The  polypoid was rather soft and friable, and undoubtedly was the source of  hemoccult positive stool. The blood count was normal.  The patient came  today to discuss further plans as far as his polyps are concerned.  He  is currently asymptomatic, having normal bowel movements.  Denies  diarrhea.  Energy level has been normal.   PLAN:  1. Repeat colonoscopy in one year.  2. CBC in March 2008.  3. Take Imodium on a p.r.n. basis.  4. We may repeat hemoccult if his blood count drops on the next blood      count.  His last iron saturation studies were completely normal at      32% iron saturation, so there is really no need for iron      supplements.     [name]. Juanda Chance, MD  Electronically Signed    DMB/MedQ  DD: 04/16/2006  DT: 04/16/2006  Job #: [id number]   cc:   Janae Bridgeman. Eloise Harman., M.D.

## 2010-08-05 NOTE — Assessment & Plan Note (Signed)
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 Jerelene Redden.  He has persistently  heme-positive stools,  although he does not see any visible blood in his stools.  Upper endoscopy  on November 13, 2005, did not show any evidence of GI blood loss.  He had a  small hiatal hernia and esophageal stricture, which was dilated with 16 mm  dilator.  His bowel habits have been regular, having one formed stool in the  morning and two loose stools in the afternoon.  Again, there has been no  bright red blood per rectum.  He has been on an exercise program and has  lost about 6-8 pounds since last appointment.  He has severe interstitial  cystitis, followed by Dr. Logan Morales, for which he takes Elmiron and Pyridium.  He has been off his aspirin, at my request, to minimize any GI blood loss.   PHYSICAL EXAMINATION:  VITAL SIGNS:  Blood pressure 124/80, pulse 84 and  weight 245 pounds.  GENERAL:  He is alert and oriented, in no distress.  LUNGS:  Clear to auscultation.  COR:  With normal S1, normal S2.  ABDOMEN:  Soft, nontender, with well-healed surgical scar, normoactive bowel  sounds.  RECTAL EXAM:  Today, shows Hemoccult-positive stool.   IMPRESSION:  [age]-year-old gentleman with persistent  heme-positive  stool.  This could be due to anastomotic irritation.  Rule out recurrent  polyps.  Rule out other source of bleeding, which could have been missed on  previous colonoscopy.  Rule out small-bowel source of bleeding, such as  AVMs.   PLAN:  1. CBC today, as well as iron studies.  2. Colonoscopy.  3. Stay of aspirin.  4. If colonoscopy  is negative, we will proceed with small bowel capsule      endoscopy.     Lucas Morales. Juanda Chance, MD  Electronically Signed    DMB/MedQ  DD: 02/02/2006  DT: 02/02/2006  Job #: [id number]   cc:   Lucas Morales. Lucas Morales., M.D.

## 2010-08-05 NOTE — Op Note (Signed)
NAME:  EVON, LOPEZPEREZ NO.:  [id number]   MEDICAL RECORD NO.:  [id number]          PATIENT TYPE:  INP   LOCATION:  0003                         FACILITY:  [facility]   PHYSICIAN:  Gita Kudo, M.D. DATE OF BIRTH:  May 22, 1933   DATE OF PROCEDURE:  01/31/2005  DATE OF DISCHARGE:                                 OPERATIVE REPORT   OPERATIVE PROCEDURE:  Exploratory laparotomy, right colectomy,  cholecystectomy.   SURGEON:  Gita Kudo, M.D.   ASSISTANT:  Lorne Skeens. Hoxworth, M.D.   PREOPERATIVE DIAGNOSES:  Right colon tumor, gallstones, history  diverticulosis and bladder problems.   POSTOPERATIVE DIAGNOSES:  Gallstones, cecal tumor.   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 He has a history of severe bladder problems and has been well  worked up for that both here and in [place]. He has had a colonoscopy that  showed a tubulovillous adenoma of the cecum. It could not be removed  totally. Preoperatively Dr. [name] injected it with methylene blue. He  has a history of diverticular disease and we were concerned that this was  also involving his bladder. He has gallstones seen on his films but probably  not symptomatic.   OPERATIVE FINDINGS:  A full thorough visual and manual laparotomy was  performed. He had a small hiatal hernia. The stomach felt normal. The entire  small bowel looked and felt normal. There was some thickening in the cecum  consistent with his adenoma. The colon had some small areas of diverticular  disease in the transverse colon and more significantly along the sigmoid  colon. There was no obstruction, infection or acute inflammation. The  bladder looked and felt normal. The right ureter was identified and not  injured.   OPERATIVE PROCEDURE:  Under satisfactory general endotracheal anesthesia,  having received antibiotics and heparin preop, the patient had Foley  catheter and  nasogastric tube placed. He was positioned, prepped and draped  in a standard fashion. A midline incision was made and the abdomen entered,  good exposure obtained and the laparotomy mentioned above. No other findings  were noted. Accordingly a right colectomy was performed. It was a rather  conservative resection because the tumor was benign. The lateral reflections  of the colon and terminal ileum were taken down by cautery up to the hepatic  flexure. Only a portion of the hepatic flexure was taken down and we had  very good exposure. Then the ileum was transected near the ileocecal valve  with the GIA stapler and the transverse colon transected just at the hepatic  flexure. The mesentery was scored with the cautery and then divided between  clamps and ties of 2-0 silk. Following this, a side-to-side - end-to-end  anastomosis performed with a GIA stapler. The staple line was hemostatic and  then the stab wounds closed in two layers with interrupted silk and running  Vicryl. A separate crotch suture of 3-0 silk was placed between the two limb  of the anastomosis. Then the gloves were changed, the abdomen lavaged with  saline. A running Vicryl suture was  used to approximate the mesentery.   Moistened gauze was then placed in the gutter and gallbladder removed.   The exposure was set, gallbladder grasped with Kelly clamps and careful  dissection revealed the cystic duct and artery. When certain of the anatomy,  these structures were controlled with multiple clips and divided. Then the  gallbladder was removed from below upward for the distal third and the  switched from above downward for the proximal portion. The gallbladder was  removed intact, without spillage or problem. The liver bed was made dry by  cautery and a small piece of Surgicel placed on this after lavaging with  saline. Then all the sponges were removed, the anastomosis noted to be  patent, without tension and had good blood  supply. After checking for  instruments and sponge counts which were correct, the abdomen was closed  with a running double-armed #1 PDS suture for the midline fascia, peritoneum  single layer. The wound was infiltrated with Marcaine, approximately 40 mL.  And then the skin edges stapled. Sterile absorbent dressings applied and the  patient went to the recovery room in good condition.           ______________________________  Gita Kudo, M.D.     MRL/MEDQ  D:  01/31/2005  T:  01/31/2005  Job:  [id number]   cc:   Ulyess Mort, M.D. [facility]  [address]  [place]  [place] [address]   [name], M.D. [facility]  [address]  [place]  [place] [address]   [place] [facility], M.D.  Fax: [phone]   Janae Bridgeman. Eloise Harman., M.D.  Fax: [phone]

## 2010-08-10 ENCOUNTER — Other Ambulatory Visit: Payer: Self-pay | Admitting: Internal Medicine

## 2010-08-29 ENCOUNTER — Telehealth: Payer: Self-pay | Admitting: Internal Medicine

## 2010-08-29 MED ORDER — CIPROFLOXACIN HCL 250 MG PO TABS: ORAL_TABLET | ORAL | Status: DC

## 2010-08-29 MED ORDER — METRONIDAZOLE 250 MG PO TABS: ORAL_TABLET | ORAL | Status: DC

## 2010-08-29 NOTE — Telephone Encounter (Signed)
Patient calling to report IBS symptoms for last 2 weeks. States he is having LLQ abdominal pain similar to when he had diverticulitis but not quiet as severe. Diarrhea x 2/day for the last 2 weeks. He has a semi formed bowel movement in AM then 30 minutes later he has diarrhea. Denies bleeding but states the diarrhea is green in color. He has tried Dicyclomine but it is not helping.  Patient has a follow up on 10/10/10 with Dr. Juanda Chance. Hx diverticulitis( 01/2010), adenomatous polyps, s/p right hemicolectomy(2006), last colonoscopy- Dec. 2009. Please, advise

## 2010-08-29 NOTE — Telephone Encounter (Signed)
Please start Cipro 250 mg po bid, #14, and Flagyl 250 mg po tid , #21, no refills. Stay on soft bland diet for several days., call us back with status update in 1 week or earlier if necessary.

## 2010-08-29 NOTE — Telephone Encounter (Signed)
Patient notified of Dr. Regino Schultze recommendations. Rx's sent to pharmacy.

## 2010-09-06 ENCOUNTER — Telehealth: Payer: Self-pay | Admitting: Internal Medicine

## 2010-09-06 DIAGNOSIS — R197 Diarrhea, unspecified: Secondary | ICD-10-CM

## 2010-09-06 MED ORDER — CHOLESTYRAMINE 4 G PO PACK: PACK | ORAL | Status: DC

## 2010-09-06 NOTE — Telephone Encounter (Signed)
Notified pt Dr Juanda Chance ordered Lucas Morales 4gram pack, one pack daily in water, take 1 hour apart from other meds. Pt stated understanding. Dr Juanda Chance, pt stated his diarrhea has been so severe, he's had to take Imodium. Is it ok to take the Imodium with the Questran? Thanks.

## 2010-09-06 NOTE — Telephone Encounter (Signed)
Let's try Questran pack 4gm, 1 pack daily in glass of water, take at least 1 hour apart from all other meds.. #20, 1 refill

## 2010-09-06 NOTE — Telephone Encounter (Signed)
Patient calling to report IBS symptoms for last 2 weeks. States he is having LLQ abdominal pain similar to when he had diverticulitis but not quiet as severe. Diarrhea x 2/day for the last 2 weeks. He has a semi formed bowel movement in AM then 30 minutes later he has diarrhea. Denies bleeding but states the diarrhea is green in color. He has tried Dicyclomine but it is not helping. Patient has a follow up on 10/10/10 with Dr. Juanda Chance. Hx diverticulitis( 01/2010), adenomatous polyps, s/p right hemicolectomy(2006), last colonoscopy- Dec. 2009. Please, advise  Pt was started on Cipro and Flagyl x 7 days and was to call back in 1 week with an update. Today he called back stating he completed the antibiotics , but is still having diarrhea. Tried to call the pt and his wife didn't know any details and she will have him called back. Dr Juanda Chance are there any new orders- labs, stool studies- prior to speaking with the pt?  Thanks.

## 2010-09-06 NOTE — Telephone Encounter (Signed)
OK to use Imodium in addition to  Questran. Have we  Obtained stool C.diff, lactoferrin, culture? Please obtain . Consider flex sigmoid or extender OV. If not better in next 7-10 days.

## 2010-09-07 NOTE — Telephone Encounter (Signed)
Spoke with patient and gave him Dr. Regino Schultze recommendations. Patient has not obtained stool specimens. Orders in EPIC. Patient to pick up today.

## 2010-09-13 ENCOUNTER — Telehealth: Payer: Self-pay

## 2010-09-13 NOTE — Telephone Encounter (Signed)
Spoke with pt and he states that he is doing pretty well. He saw his PCP and states that he did the stool analysis and he will have the results sent to Dr. Juanda Chance.

## 2010-10-10 ENCOUNTER — Encounter: Payer: Self-pay | Admitting: Internal Medicine

## 2010-10-10 ENCOUNTER — Ambulatory Visit (INDEPENDENT_AMBULATORY_CARE_PROVIDER_SITE_OTHER): Payer: Medicare Other | Admitting: Internal Medicine

## 2010-10-10 VITALS — BP 124/68 | HR 90 | Ht 75.0 in | Wt 242.0 lb

## 2010-10-10 DIAGNOSIS — T8189XA Other complications of procedures, not elsewhere classified, initial encounter: Secondary | ICD-10-CM

## 2010-10-10 DIAGNOSIS — R197 Diarrhea, unspecified: Secondary | ICD-10-CM

## 2010-10-10 MED ORDER — CIPROFLOXACIN HCL 250 MG PO TABS: 250.0000 mg | ORAL_TABLET | Freq: Two times a day (BID) | ORAL | Status: AC

## 2010-10-10 MED ORDER — LOPERAMIDE HCL 2 MG PO TABS: 2.0000 mg | ORAL_TABLET | Freq: Three times a day (TID) | ORAL | Status: DC | PRN

## 2010-10-10 MED ORDER — METRONIDAZOLE 250 MG PO TABS: 250.0000 mg | ORAL_TABLET | Freq: Three times a day (TID) | ORAL | Status: AC

## 2010-10-10 NOTE — Progress Notes (Signed)
Lucas Morales [birth year] MRN [id number]     History of Present Illness:  This is a 77 year old white male with diarrhea since June of this year having 3 loose bowel movements a day but not at night. There has been no incontinence or blood per rectum. He had a right hemicolectomy for a carpeted adenomatous polyp of the right colon in November 2006. He had postoperative diarrhea for several weeks then developed normal bowel movements, even constipation. He was at that time on OxyContin for interstitial cystitis. His diarrhea started around the time when he was begun on blood pressure medications including Bystolic and Cozaar, both medications associated with diarrhea. He also discontinued his OxyContin. He was given a course of Flagyl and Cipro for presumed bacterial overgrowth without much improvement. Imodium seems to help his diarrhea. His weight has been stable. His diet has changed to eating more fiber, salads, fruits and juice during the summer months.   Past Medical History  Diagnosis Date  . Adenomatous colon polyp   . Hemorrhoids   . Diverticulosis   . Gastritis   . GERD (gastroesophageal reflux disease)   . IC (interstitial cystitis)   . Esophageal stricture   . Hiatal hernia   . Diverticulitis    Past Surgical History  Procedure Date  . Reconstruction medial collateral ligament elbow w/ tendon graft   . Total knee arthroplasty   . Bladder lesion removed   . Exploratory laparotomy     with right colectomy  . Cholecystectomy   . Appendectomy   . Back surgery     reports that he has never smoked. He does not have any smokeless tobacco history on file. He reports that he drinks alcohol. His drug history not on file. family history includes Heart disease in his mother.  There is no history of Colon cancer. Allergies  Allergen Reactions  . Morphine     REACTION: itch        Review of Systems:  The remainder of the 10  point ROS is negative except as outlined in  H&P   Physical Exam: General appearance  Well developed in no distress. Eyes- non icteric. HEENT nontraumatic, normocephalic. Mouth no lesions, tongue papillated, no cheilosis. Neck supple without adenopathy, thyroid not enlarged, no carotid bruits, no JVD. Lungs Clear to auscultation bilaterally. Cor normal S1 normal S2, regular rhythm , no murmur,  quiet precordium. Abdomen soft mildly tender abdomen and left lower quadrant. Hypoactive bowel sounds. Some tympany in right upper quadrant. No mass. Rectal not done:. Extremities no pedal edema. Skin no lesions. Neurological alert and oriented x 3. Psychological normal mood and affect.  Assessment and Plan:  Problem #1 Diarrhea. Patient has a combination of postsurgical diarrhea  due to right hemicolectomy., bile overflow and bacterial overgrowth. It could also be related to eating more fruits and fiber during the summer. Medications have attributed to stool, specifically his blood pressure medications. Other reasons such as symptomatic diverticulosis or irritable bowel syndrome may be a factor. He will discuss his blood pressure medications with his cardiologist and his PCP, Dr Lucas Morales. I have sent him a prescription for Imodium 2 mg to take 1-2 a day as needed. He will cut back on some of the fiber as well. We will also give him a refill on his Cipro and Flagyl for diverticulitis which may occur while he's traveling.  Problem #2 History of adenomatous polyps for which he is status post random hemicolectomy. He would be due for a recall colonoscopy  in December 2012.  Problem #3 Moderate to severe diverticulosis. Patient is currently on 15 g of fiber a day.   10/10/2010 Lucas Morales

## 2010-10-10 NOTE — Patient Instructions (Addendum)
We have sent the following medications to your pharmacy for you to pick up at your convenience: Imodium, Cipro and Flagyl. CC: Dr Nanetta Batty, Dr Merri Brunette

## 2010-11-01 ENCOUNTER — Other Ambulatory Visit: Payer: Self-pay | Admitting: Nurse Practitioner

## 2011-01-04 LAB — POCT HEMOGLOBIN-HEMACUE
Hemoglobin: 14.3
Operator id: 268271

## 2011-12-19 HISTORY — PX: TUMOR EXCISION: SHX421

## 2012-01-02 ENCOUNTER — Other Ambulatory Visit: Payer: Self-pay | Admitting: Registered Nurse

## 2012-01-04 HISTORY — PX: TRANSTHORACIC ECHOCARDIOGRAM: SHX275

## 2012-01-08 ENCOUNTER — Ambulatory Visit
Admission: RE | Admit: 2012-01-08 | Discharge: 2012-01-08 | Disposition: A | Discharge status: Home / Self Care | Payer: Medicare Other | Source: Ambulatory Visit | Attending: Cardiovascular Disease | Admitting: Cardiovascular Disease

## 2012-01-08 ENCOUNTER — Other Ambulatory Visit: Payer: Self-pay | Admitting: Cardiology

## 2012-01-08 ENCOUNTER — Other Ambulatory Visit: Payer: Self-pay | Admitting: Cardiovascular Disease

## 2012-01-08 DIAGNOSIS — R0602 Shortness of breath: Secondary | ICD-10-CM

## 2012-01-11 ENCOUNTER — Other Ambulatory Visit: Payer: Self-pay | Admitting: Cardiothoracic Surgery

## 2012-01-11 ENCOUNTER — Inpatient Hospital Stay (HOSPITAL_COMMUNITY)
Admission: RE | Admit: 2012-01-11 | Discharge: 2012-01-12 | DRG: 287 | Disposition: A | Discharge status: Home / Self Care | Payer: Medicare Other | Source: Ambulatory Visit | Attending: Cardiology | Admitting: Cardiology

## 2012-01-11 ENCOUNTER — Encounter (HOSPITAL_COMMUNITY): Payer: Self-pay | Admitting: Cardiology

## 2012-01-11 ENCOUNTER — Encounter (HOSPITAL_COMMUNITY)
Admission: RE | Disposition: A | Discharge status: Home / Self Care | Payer: Self-pay | Source: Ambulatory Visit | Attending: Cardiology

## 2012-01-11 DIAGNOSIS — R001 Bradycardia, unspecified: Secondary | ICD-10-CM | POA: Diagnosis not present

## 2012-01-11 DIAGNOSIS — Z79899 Other long term (current) drug therapy: Secondary | ICD-10-CM

## 2012-01-11 DIAGNOSIS — Z7982 Long term (current) use of aspirin: Secondary | ICD-10-CM

## 2012-01-11 DIAGNOSIS — Z951 Presence of aortocoronary bypass graft: Secondary | ICD-10-CM | POA: Diagnosis present

## 2012-01-11 DIAGNOSIS — E785 Hyperlipidemia, unspecified: Secondary | ICD-10-CM | POA: Diagnosis present

## 2012-01-11 DIAGNOSIS — I2510 Atherosclerotic heart disease of native coronary artery without angina pectoris: Secondary | ICD-10-CM

## 2012-01-11 DIAGNOSIS — I498 Other specified cardiac arrhythmias: Secondary | ICD-10-CM | POA: Diagnosis not present

## 2012-01-11 DIAGNOSIS — K219 Gastro-esophageal reflux disease without esophagitis: Secondary | ICD-10-CM | POA: Diagnosis present

## 2012-01-11 DIAGNOSIS — I10 Essential (primary) hypertension: Secondary | ICD-10-CM | POA: Diagnosis present

## 2012-01-11 DIAGNOSIS — Z96659 Presence of unspecified artificial knee joint: Secondary | ICD-10-CM

## 2012-01-11 DIAGNOSIS — R0602 Shortness of breath: Secondary | ICD-10-CM | POA: Diagnosis present

## 2012-01-11 HISTORY — PX: LEFT HEART CATHETERIZATION WITH CORONARY ANGIOGRAM: SHX5451

## 2012-01-11 HISTORY — PX: CARDIAC CATHETERIZATION: SHX172

## 2012-01-11 HISTORY — DX: Unspecified osteoarthritis, unspecified site: M19.90

## 2012-01-11 HISTORY — DX: Hyperlipidemia, unspecified: E78.5

## 2012-01-11 HISTORY — DX: Gout, unspecified: M10.9

## 2012-01-11 HISTORY — DX: Essential (primary) hypertension: I10

## 2012-01-11 HISTORY — DX: Cardiac arrhythmia, unspecified: I49.9

## 2012-01-11 HISTORY — DX: Atherosclerotic heart disease of native coronary artery without angina pectoris: I25.10

## 2012-01-11 LAB — GLUCOSE, CAPILLARY: Glucose-Capillary: 122 mg/dL — ABNORMAL HIGH (ref 70–99)

## 2012-01-11 SURGERY — LEFT HEART CATHETERIZATION WITH CORONARY ANGIOGRAM
Anesthesia: LOCAL

## 2012-01-11 MED ORDER — ONDANSETRON HCL 4 MG/2ML IJ SOLN: 4.0000 mg | Freq: Four times a day (QID) | INTRAMUSCULAR | Status: DC | PRN

## 2012-01-11 MED ORDER — ACETAMINOPHEN 325 MG PO TABS: 650.0000 mg | ORAL_TABLET | ORAL | Status: DC | PRN

## 2012-01-11 MED ORDER — NITROGLYCERIN 0.2 MG/ML ON CALL CATH LAB
INTRAVENOUS | Status: AC
  Filled 2012-01-11: qty 1

## 2012-01-11 MED ORDER — NORTRIPTYLINE HCL 10 MG PO CAPS
10.0000 mg | ORAL_CAPSULE | Freq: Every day | ORAL | Status: DC
  Administered 2012-01-11: 10 mg via ORAL
  Filled 2012-01-11 (×2): qty 1

## 2012-01-11 MED ORDER — SODIUM CHLORIDE 0.9 % IJ SOLN: 3.0000 mL | INTRAMUSCULAR | Status: DC | PRN

## 2012-01-11 MED ORDER — FENTANYL CITRATE 0.05 MG/ML IJ SOLN
INTRAMUSCULAR | Status: AC
  Filled 2012-01-11: qty 2

## 2012-01-11 MED ORDER — SODIUM CHLORIDE 0.9 % IV SOLN: 1.0000 mL/kg/h | INTRAVENOUS | Status: AC

## 2012-01-11 MED ORDER — LOPERAMIDE HCL 2 MG PO CAPS
6.0000 mg | ORAL_CAPSULE | ORAL | Status: DC | PRN
  Administered 2012-01-11 – 2012-01-12 (×2): 6 mg via ORAL
  Filled 2012-01-11 (×2): qty 3

## 2012-01-11 MED ORDER — ASPIRIN EC 81 MG PO TBEC
81.0000 mg | DELAYED_RELEASE_TABLET | Freq: Every day | ORAL | Status: DC
  Administered 2012-01-12: 81 mg via ORAL
  Filled 2012-01-11 (×2): qty 1

## 2012-01-11 MED ORDER — DIAZEPAM 5 MG PO TABS
5.0000 mg | ORAL_TABLET | ORAL | Status: AC
  Administered 2012-01-11: 5 mg via ORAL
  Filled 2012-01-11: qty 1

## 2012-01-11 MED ORDER — LOPERAMIDE HCL 2 MG PO CAPS: 2.0000 mg | ORAL_CAPSULE | Freq: Three times a day (TID) | ORAL | Status: DC | PRN

## 2012-01-11 MED ORDER — MIDAZOLAM HCL 2 MG/2ML IJ SOLN
INTRAMUSCULAR | Status: AC
  Filled 2012-01-11: qty 2

## 2012-01-11 MED ORDER — HEPARIN (PORCINE) IN NACL 2-0.9 UNIT/ML-% IJ SOLN
INTRAMUSCULAR | Status: AC
  Filled 2012-01-11: qty 1000

## 2012-01-11 MED ORDER — PANTOPRAZOLE SODIUM 40 MG PO TBEC
40.0000 mg | DELAYED_RELEASE_TABLET | Freq: Every day | ORAL | Status: DC
  Administered 2012-01-12: 40 mg via ORAL
  Filled 2012-01-11: qty 1

## 2012-01-11 MED ORDER — ALUM & MAG HYDROXIDE-SIMETH 200-200-20 MG/5ML PO SUSP
30.0000 mL | ORAL | Status: DC | PRN
  Administered 2012-01-11: 30 mL via ORAL
  Filled 2012-01-11: qty 30

## 2012-01-11 MED ORDER — ALLOPURINOL 300 MG PO TABS
300.0000 mg | ORAL_TABLET | Freq: Every day | ORAL | Status: DC
  Administered 2012-01-12: 300 mg via ORAL
  Filled 2012-01-11 (×2): qty 1

## 2012-01-11 MED ORDER — SODIUM CHLORIDE 0.9 % IV SOLN
INTRAVENOUS | Status: DC
  Administered 2012-01-11: 10:00:00 via INTRAVENOUS

## 2012-01-11 MED ORDER — LIDOCAINE HCL (PF) 1 % IJ SOLN
INTRAMUSCULAR | Status: AC
  Filled 2012-01-11: qty 30

## 2012-01-11 MED ORDER — VERAPAMIL HCL 2.5 MG/ML IV SOLN
INTRAVENOUS | Status: AC
  Filled 2012-01-11: qty 2

## 2012-01-11 MED ORDER — LOSARTAN POTASSIUM 50 MG PO TABS
50.0000 mg | ORAL_TABLET | Freq: Every day | ORAL | Status: DC
  Administered 2012-01-12: 50 mg via ORAL
  Filled 2012-01-11: qty 1

## 2012-01-11 NOTE — Progress Notes (Signed)
Selena Batten, R.N. On 2000 given report.  Dr. Herbie Baltimore notified of pt's room number 2019.

## 2012-01-11 NOTE — H&P (Signed)
History and Physical Interval Note:  NAME:  Lucas Morales   MRN: [id number] DOB:  12/14/1933   ADMIT DATE: 01/11/2012   01/11/2012 9:33 AM  Lucas Morales is a 78 y.o. male patient of Dr.J. Allyson Sabal with a PMH below who was seen in August to evaluate Sx of exertional CP & SOB.  He underwent Myoview ST that showed possible LAD & RCA distribution ischemia. He is now referred for invasive cardiac evaluation with LHCv+/- PCI.   Past Medical History  Diagnosis Date  . Adenomatous colon polyp   . Hemorrhoids   . Diverticulosis   . Gastritis   . GERD (gastroesophageal reflux disease)   . IC (interstitial cystitis)   . Esophageal stricture   . Hiatal hernia   . Diverticulitis   . Hypertension   . Hyperlipidemia    Past Surgical History  Procedure Date  . Reconstruction medial collateral ligament elbow w/ tendon graft   . Total knee arthroplasty   . Bladder lesion removed   . Exploratory laparotomy     with right colectomy  . Cholecystectomy   . Appendectomy   . Back surgery     FAMHx: Family History  Problem Relation Age of Onset  . Colon cancer Neg Hx   . Heart disease Mother     SOCHx:  reports that he has never smoked. He does not have any smokeless tobacco history on file. He reports that he drinks alcohol. His drug history not on file.  ALLERGIES: Allergies  Allergen Reactions  . Morphine     REACTION: itch    HOME MEDICATIONS: Prescriptions prior to admission  Medication Sig Dispense Refill  . allopurinol (ZYLOPRIM) 300 MG tablet Take 300 mg by mouth daily.      [name] aspirin EC 81 MG tablet Take 81 mg by mouth daily.      . calcium carbonate (TUMS EX) 750 MG chewable tablet Chew 2 tablets by mouth daily.       [name] desmopressin (DDAVP) 0.2 MG tablet Take 0.2 mg by mouth at bedtime.        [name] loperamide (IMODIUM) 2 MG capsule Take 6 mg by mouth daily.      [name] losartan (COZAAR) 50 MG tablet Take 50 mg by mouth daily.      . Multiple Vitamins-Minerals  (MULTIVITAMIN WITH MINERALS) tablet Take 1 tablet by mouth daily.        . nortriptyline (PAMELOR) 10 MG capsule Take 10 mg by mouth at bedtime.      [name] OVER THE COUNTER MEDICATION Take 5 g by mouth daily. Fiber Well      . pantoprazole (PROTONIX) 40 MG tablet Take 40 mg by mouth daily.      . tadalafil (CIALIS) 5 MG tablet Take 5 mg by mouth daily as needed. For erectile dysfunction        PHYSICAL EXAM:Blood pressure 144/91, pulse 85, temperature 97.4 F (36.3 C), temperature source Oral, resp. rate 18, height 6' (1.829 m), weight 113.399 kg (250 lb), SpO2 97.00%. General appearance: alert, cooperative, appears stated age, no distress and pleasant mood & affect Lungs: clear to auscultation bilaterally, normal percussion bilaterally and non-labored Heart: regular rate and rhythm, S1, S2 normal, no murmur, click, rub or gallop Abdomen: soft, non-tender; bowel sounds normal; no masses,  no organomegaly Extremities: extremities normal, atraumatic, no cyanosis or edema Pulses: 2+ and symmetric Neurologic: Cranial nerves: normal  IMPRESSION & PLAN The patients' history has been reviewed, patient examined, no change  in status from most recent note, stable for surgery. I have reviewed the patients' chart and labs. Questions were answered to the patient's satisfaction.  Principal Problem:  *Chest pain on exertion Active Problems:  Abnormal nuclear cardiac imaging test -- Inferolateral ischemia; Coronary CTA in 2010, mild-moderate 3 V plaque; Echo - EF >55%, moderate to severe LVH  Shortness of breath on exertion  Hypertension  Hyperlipidemia  Lucas Morales has presented today for surgery, with the diagnosis of exertional chest pain & SOB with an abnormal myoview ST. The various methods of treatment have been discussed with the patient and family.   Risks / Complications include, but not limited to: Death, MI, CVA/TIA, VF/VT (with defibrillation), Bradycardia (need for temporary pacer  placement), contrast induced nephropathy, bleeding / bruising / hematoma / pseudoaneurysm, vascular or coronary injury (with possible emergent CT or Vascular Surgery), adverse medication reactions, infection.     After consideration of risks, benefits and other options for treatment, the patient has consented to Procedure(s):  LEFT HEART CATHETERIZATION AND CORONARY ANGIOGRAPHY +/- AD HOC PERCUTANEOUS CORONARY INTERVENTION  as a invasive intervention.   We will proceed with the planned procedure. This procedure has been fully reviewed with the patient and written informed consent has been obtained. If PCI warranted - DES acceptable.  Elray Dains W [facility] [address] [place], [place]  [address]  [phone]  01/11/2012 9:33 AM

## 2012-01-11 NOTE — CV Procedure (Signed)
[facility] PERCUTANEOUS CORONARY INTERVENTION REPORT  NAME:  [name]   MRN: [id number] DOB:  June 28, 1933   ADMIT DATE: 01/11/2012  INTERVENTIONAL CARDIOLOGIST: Marykay Lex, M.D., MS PRIMARY CARE PROVIDER: [name], MD PRIMARY CARDIOLOGIST:   PATIENT:  Lucas Morales is a 78 y.o. male patient of Dr.J. Allyson Sabal with a PMH below who was seen in August to evaluate Sx of exertional CP & SOB. He underwent Myoview ST that showed possible LAD & RCA distribution ischemia.  He is now referred for invasive cardiac evaluation with LHCv+/- PCI.  PRE-OPERATIVE DIAGNOSIS:    Exertional Chest Pain -- worsening  Exertional Shortness of breath  Abnormal Myoview Stress Test read as High Risk due to potential multivessel territory of ischemia.  PROCEDURES PERFORMED:    Left Heart Catheterization with Native Coronary Artery Angiography via 5 Fr Right Radial Artery Access  PROCEDURE: Consent: Risks of procedure as well as the alternatives and risks of each were explained to the (patient/caregiver).  Consent for procedure obtained. Consent for signed by MD and patient with RN witness -- placed on chart.  PROCEDURE: The patient was brought to the 2nd Floor [facility] in the fasting state and prepped and draped in the usual sterile fashion for Right Radial or Femoral Artery access. A modified Allen's test with plethysmography was performed demonstrating adequate Ulnar artery collateralization for Radial artery access.  Sterile technique was used including antiseptics, cap, gloves, gown, hand hygiene, mask and sheet.  Skin prep: Chlorhexidine;  Time Out: Verified patient identification, verified procedure, site/side was marked, verified correct patient position, special equipment/implants available, medications/allergies/relevent history reviewed, required imaging and test results available.  Performed  Access: Right Radial Artery;  5 Fr Sheath; Seldinger technique using 5Fr Glide Sheath Angiocath micropuncture kit.  Radial cocktail administered through the sheath.  IV Heparin administered when catheter advanced to Ascending Aorta. Diagnostic:  TIG 4.0 was advanced over a Versicore wire into the ascending aorta and used to engage the Right then Left Coronary Arteries.  It was then used to cross the Aortic Valve to measure LV Hemodynamics and Aortic Valve "pullback gradient".  An LV Gram was mot performed to conserve contrast since an echocardiogram has recently been performed.  Right then Left Coronary Artery Angiography:  TIG 4.0  Hemodynamics:  Central Aortic / Mean Pressures: 110 mmHg; 76 mmHg  Left Ventricular Pressures / EDP: 108/10 mmHg; 12 mmHg   EF: ~45 % by Echo  Coronary Anatomy:  Left Main: Large caliber vessel that bifurcates normally into the LAD & Circumflex; angiographically normal LAD: The vessel begins as a large vessel that gives off a small to moderate sized D1 and a large / major Septal perforator "trunk" that provides septal circulation for most of the proximal and mid septum with 3 main branches. The LAD after D1 has a focal ~80% stenosis followed by diffuse moderate (~50%) disease in the mid portion where 2 small (not named) diagonal branches arise. Distally, the vessel bifurcates into a Moderate caliber D2 and a smaller caliber terminal LAD that tapers off towards, but does not reach, the Apex.  D1: small to moderate caliber vessel that bifurcates and covers the basal anterolateral wall; minimal luminal irregularities  D2: Moderate caliber vessel that arises from the distal LAD and covers a large territory in the distal/apical anterolateral wall; minimal luminal irregularities Left Circumflex: Large caliber (larger than the LAD) vessel that is appears to be co-dominant.  The vessel gives off  a proximal OM1, then courses in the AV Groove where it gives off a very small OM branch (OMB) llowed by a  small OM2.  Shortly after OM2, there is an eccentric ~60% lesion where a small AVGroove branch followed by another small OMB arises before the vessel bifurcates into   OM3: Moderate to large caliber vessel that travel almost in tandem with OM4 along the inferolateral wall to the apex; Proximal focal 80% followed by ~90% stenosis, then minimal luminal irregularities distally  OM4: Moderate to large caliber vessel; proximal ~70% stenosis, then diffuse mild luminal irregularitites  LPL System: after OM4, the follow-on circumflex continues into the posterior AV groove where it branches into several small LPL branches.  RCA: Large caliber "co-dominant" vessel, at least 2 major RV marginal branches arise from the mid vessel.  Distally, the vessel tapers until it bifurcates into what appears to be tandem RPDA branches with the first being a relatively small / insignificant branch, while the more distal branch courses in what looks to be the Posterior AV Groove then has the appearance of a 2nd &major RPDA (with septal perforators) that reaches the apex.  RPDA: Small to moderate caliber vessel that reaches the apex; proximal tubular ~90% stenosis prior to AV Nodal RPL branch followed by a long segment with diffuse moderate to severe stenoses (50-70%) until the mid vessel that is relatively free of disease.  RPL Sysytem: minimal branch with AV Nodal artery arising from the 2nd RPDA.  TR Band:  1415 Hours, 13 mL air  ANESTHESIA:   Local Lidocaine 2 ml SEDATION:  2 mg IV Versed, 50 mcg IV fentanyl ; Premedication: 5 mg PO Valium MEDICATIONS: Radial Cocktail: 5 mg Verapamil, 400 mcg NTG, 2 ml 2% Lidocaine in 10 ml NS   Anticoagulation:  IV Heparin 5500 Units  Omnipaque Contrast: 70ml  EBL:   < 10 ml  PATIENT DISPOSITION:    The patient was transferred to the PACU holding area in a hemodynamicaly stable, chest pain free condition.  The patient tolerated the procedure well, and there were no  complications.  The patient was stable before, during, and after the procedure.  POST-OPERATIVE DIAGNOSIS:    Severe multivessel CAD involving the LAD, 2 major OM branches (OM3&4) as well as what seems to be the RPDA.  The RPDA lesions are quite distal and cover a long segment, making them unfavorable for Percutaneous revascularization.  PLAN OF CARE:  CT Surgical consultation for possible CABG x 4.  Will admit overnight for monitoring as he has had intermittent episodes of Bradycardia into the 30s.  Will need to have a firm plan in place re: timing & type of revascularization.  As the lesions are not proximal, and he has been relatively stable -- with the exception of his angina, he may be amenable to d/c home for the weekend with plan for CABG early next week.  I am somewhat concerned by his wife's attestation to his occasionally awakening with dyspnea & chest pressure.   Marykay Lex, M.D., M.S. [facility] [address] [place], [place]  [address]  [phone]  01/11/2012 7:13 PM

## 2012-01-11 NOTE — Progress Notes (Signed)
Pt's heart rate continues to drop into the high 30s-low 40's. I have counted pt's HR to verify what the pulse ox probe is reading.  Pt is asymptomatic and without physical symptoms.  He denies any chest pain or tightness. Dr. Herbie Baltimore notified.  Orders received to admit pt at this time.  Pt placement was called for telemetry bed.

## 2012-01-12 ENCOUNTER — Inpatient Hospital Stay (HOSPITAL_COMMUNITY): Payer: Medicare Other

## 2012-01-12 DIAGNOSIS — Z01810 Encounter for preprocedural cardiovascular examination: Secondary | ICD-10-CM

## 2012-01-12 DIAGNOSIS — I2510 Atherosclerotic heart disease of native coronary artery without angina pectoris: Secondary | ICD-10-CM

## 2012-01-12 LAB — COMPREHENSIVE METABOLIC PANEL
ALT: 26 U/L (ref 0–53)
AST: 24 U/L (ref 0–37)
Albumin: 4.2 g/dL (ref 3.5–5.2)
Alkaline Phosphatase: 65 U/L (ref 39–117)
BUN: 17 mg/dL (ref 6–23)
CO2: 24 mEq/L (ref 19–32)
Calcium: 9.3 mg/dL (ref 8.4–10.5)
Chloride: 102 mEq/L (ref 96–112)
Creatinine, Ser: 1.05 mg/dL (ref 0.50–1.35)
GFR calc Af Amer: 78 mL/min — ABNORMAL LOW (ref >90)
GFR calc non Af Amer: 67 mL/min — ABNORMAL LOW (ref >90)
Glucose, Bld: 158 mg/dL — ABNORMAL HIGH (ref 70–99)
Potassium: 3.8 mEq/L (ref 3.5–5.1)
Sodium: 138 mEq/L (ref 135–145)
Total Bilirubin: 0.6 mg/dL (ref 0.3–1.2)
Total Protein: 7 g/dL (ref 6.0–8.3)

## 2012-01-12 LAB — HEMOGLOBIN A1C
Hgb A1c MFr Bld: 6.2 % — ABNORMAL HIGH (ref <5.7)
Mean Plasma Glucose: 131 mg/dL — ABNORMAL HIGH (ref <117)

## 2012-01-12 LAB — CBC
HCT: 41 % (ref 39.0–52.0)
Hemoglobin: 13.9 g/dL (ref 13.0–17.0)
MCV: 86.5 fL (ref 78.0–100.0)
RBC: 4.74 MIL/uL (ref 4.22–5.81)
RDW: 16.3 % — ABNORMAL HIGH (ref 11.5–15.5)
WBC: 4.2 10*3/uL (ref 4.0–10.5)

## 2012-01-12 LAB — BLOOD GAS, ARTERIAL
Acid-base deficit: 1.8 mmol/L (ref 0.0–2.0)
Bicarbonate: 21.5 mEq/L (ref 20.0–24.0)
FIO2: 21 %
O2 Saturation: 95.6 %
Patient temperature: 98.6
TCO2: 22.5 mmol/L (ref 0–100)
pCO2 arterial: 30.8 mmHg — ABNORMAL LOW (ref 35.0–45.0)
pH, Arterial: 7.458 — ABNORMAL HIGH (ref 7.350–7.450)
pO2, Arterial: 79.1 mmHg — ABNORMAL LOW (ref 80.0–100.0)

## 2012-01-12 LAB — PULMONARY FUNCTION TEST

## 2012-01-12 LAB — BASIC METABOLIC PANEL
CO2: 26 mEq/L (ref 19–32)
Chloride: 102 mEq/L (ref 96–112)
Creatinine, Ser: 1.09 mg/dL (ref 0.50–1.35)
GFR calc Af Amer: 75 mL/min — ABNORMAL LOW (ref >90)

## 2012-01-12 LAB — APTT: aPTT: 34 seconds (ref 24–37)

## 2012-01-12 LAB — TSH: TSH: 1.12 u[IU]/mL (ref 0.350–4.500)

## 2012-01-12 MED ORDER — NITROGLYCERIN 0.4 MG SL SUBL: 0.4000 mg | SUBLINGUAL_TABLET | SUBLINGUAL | Status: DC | PRN

## 2012-01-12 MED ORDER — LOPERAMIDE HCL 2 MG PO CAPS: 6.0000 mg | ORAL_CAPSULE | Freq: Every day | ORAL | Status: DC

## 2012-01-12 NOTE — Progress Notes (Signed)
Subjective: No chest pain, no SOB Concerned that he has problems receiving Immodium  Objective: Vital signs in last 24 hours: Temp:  [97.4 F (36.3 C)-98 F (36.7 C)] 97.4 F (36.3 C) (10/25 0410) Pulse Rate:  [39-71] 64  (10/25 0410) Resp:  [18-20] 18  (10/25 0410) BP: (132-146)/(61-78) 140/67 mmHg (10/25 0410) SpO2:  [95 %-99 %] 95 % (10/25 0410) Weight:  [113.3 kg (249 lb 12.5 oz)] 113.3 kg (249 lb 12.5 oz) (10/24 1843) Weight change:  Last BM Date: 01/11/12 Intake/Output from previous day: +1376 10/24 0701 - 10/25 0700 In: 1376.3 [P.O.:480; I.V.:896.3] Out: -  Intake/Output this shift:    PE: General:alert and oriented, no complaints  Heart:S1S2 RRR Lungs:clear without rales, rhonchi or wheezes Abd:+ BS, soft, non tender Ext:rt. Radial without hematoma, no lower ext edema  No further bradycardia, ? vasovagal post cath      EKG:No orders found for this or any previous visit.  Studies/Results: Cardiac cath 01/11/12: POST-OPERATIVE DIAGNOSIS:  Severe multivessel CAD involving the LAD, 2 major OM branches (OM3&4) as well as what seems to be the RPDA.  The RPDA lesions are quite distal and cover a long segment, making them unfavorable for Percutaneous revascularization. Admitted overnight to monitor bradycardia and hydrate.     Medications: I have reviewed pts. meds.    [name] allopurinol  300 mg Oral Daily  . aspirin EC  81 mg Oral Daily  . diazepam  5 mg Oral On Call  . fentaNYL      . heparin      . lidocaine      . losartan  50 mg Oral Daily  . midazolam      . nitroGLYCERIN      . nortriptyline  10 mg Oral QHS  . pantoprazole  40 mg Oral Daily  . verapamil       Assessment/Plan: Principal Problem:  *Abnormal nuclear cardiac imaging test -- Inferolateral ischemia; Coronary CTA in 2010, mild-moderate 3 V plaque; Echo - EF >55%, moderate to severe LVH Active Problems:  Hypertension  Hyperlipidemia  Chest pain on exertion  Shortness of breath on  exertion  Bradycardia, sinus  CAD (coronary artery disease) -- mLAD 80&-60%, OM2 90%, OM3 70-80%, RPDA 90%, prob cabg  PLAN: Dr. Donata Clay to see today and prob. Surgery next week.  Will recheck labs. ? Discharge, his wife states that he awakens with dyspnea and chest pressure.  Pt stated if surgery Monday he would prefer to stay in hospital until that time.  LOS: 1 day   INGOLD,LAURA R 01/12/2012, 9:58 AM   Patient seen and examined. Agree with assessment and plan. Pt states surgery is now scheduled for next Thursday. For carotid duplex imaging later today and probable dc today. Increase medical therapy pending surgery.   Lennette Bihari, MD, [facility] 01/12/2012 11:59 AM

## 2012-01-12 NOTE — Progress Notes (Addendum)
VASCULAR LAB PRELIMINARY  PRELIMINARY  PRELIMINARY  PRELIMINARY  Pre-op Cardiac Surgery  Carotid Findings:  Bilateral:  No evidence of hemodynamically significant internal carotid artery stenosis.   Vertebral artery flow is antegrade.     Upper Extremity Right Left  Brachial Pressures 153 T 140 T  Radial Waveforms T T  Ulnar Waveforms T T  Palmar Arch (Allen's Test) WNL WNL   Findings:  Doppler waveforms remain normal with ulnar and radial compressions bilaterally.    Lower  Extremity Right Left  Dorsalis Pedis    Anterior Tibial    Posterior Tibial    Ankle/Brachial Indices      Findings:  Palpable pedal pulses x 4.   Kenyatta Gloeckner, 01/12/2012, 1:04 PM

## 2012-01-12 NOTE — Discharge Summary (Signed)
Physician Discharge Summary  Patient ID: Lucas Morales MRN: [id number] DOB/AGE: 05-09-1933 78 y.o.  Admit date: 01/11/2012 Discharge date: 01/12/2012  Discharge Diagnoses:  Principal Problem:  *Abnormal nuclear cardiac imaging test -- Inferolateral ischemia; Coronary CTA in 2010, mild-moderate 3 V plaque; Echo - EF >55%, moderate to severe LVH Active Problems:  Hypertension  Hyperlipidemia  Chest pain on exertion  Shortness of breath on exertion  Bradycardia, sinus  CAD (coronary artery disease) -- mLAD 80&-60%, OM2 90%, OM3 70-80%, RPDA 90%, CABG 01/18/12   Discharged Condition: good  Hospital Course: Lucas Morales is a 78 y.o. male patient of Dr.J. Allyson Sabal who was seen in August to evaluate Sx of exertional CP & SOB. He underwent Myoview ST that showed possible LAD & RCA distribution ischemia. He presented 01/11/12 for cardiac cath and possible intervention.  Cath revealed  Coronary Anatomy:  Left Main: Large caliber vessel that bifurcates normally into the LAD & Circumflex; angiographically normal LAD: The vessel begins as a large vessel that gives off a small to moderate sized D1 and a large / major Septal perforator "trunk" that provides septal circulation for most of the proximal and mid septum with 3 main branches. The LAD after D1 has a focal ~80% stenosis followed by diffuse moderate (~50%) disease in the mid portion where 2 small (not named) diagonal branches arise. Distally, the vessel bifurcates into a Moderate caliber D2 and a smaller caliber terminal LAD that tapers off towards, but does not reach, the Apex.  D1: small to moderate caliber vessel that bifurcates and covers the basal anterolateral wall; minimal luminal irregularities  D2: Moderate caliber vessel that arises from the distal LAD and covers a large territory in the distal/apical anterolateral wall; minimal luminal irregularities Left Circumflex: Large caliber (larger than the LAD) vessel that is appears to  be co-dominant. The vessel gives off a proximal OM1, then courses in the AV Groove where it gives off a very small OM branch (OMB) llowed by a small OM2. Shortly after OM2, there is an eccentric ~60% lesion where a small AVGroove branch followed by another small OMB arises before the vessel bifurcates into  OM3: Moderate to large caliber vessel that travel almost in tandem with OM4 along the inferolateral wall to the apex; Proximal focal 80% followed by ~90% stenosis, then minimal luminal irregularities distally  OM4: Moderate to large caliber vessel; proximal ~70% stenosis, then diffuse mild luminal irregularitites  LPL System: after OM4, the follow-on circumflex continues into the posterior AV groove where it branches into several small LPL branches. RCA: Large caliber "co-dominant" vessel, at least 2 major RV marginal branches arise from the mid vessel. Distally, the vessel tapers until it bifurcates into what appears to be tandem RPDA branches with the first being a relatively small / insignificant branch, while the more distal branch courses in what looks to be the Posterior AV Groove then has the appearance of a 2nd &major RPDA (with septal perforators) that reaches the apex.  RPDA: Small to moderate caliber vessel that reaches the apex; proximal tubular ~90% stenosis prior to AV Nodal RPL branch followed by a long segment with diffuse moderate to severe stenoses (50-70%) until the mid vessel that is relatively free of disease.  RPL Sysytem: minimal branch with AV Nodal artery arising from the 2nd RPDA. Cardiovascular consult was obtained with Dr. Kathlee Nations Trigt.  Plans for CABG on 01/18/12.  Pt was kept overnight because he had bradycardia with HR in the 30's post cath.  Once he was placed on tele. No further episodes.   By the next am no complaints, rt radial cath site was stable. And pt was seen by surgery.  Stable and ready for d/c, seen by Dr. Tresa Endo.    Consults: Dr. Donata Clay, with  [facility]  Significant Diagnostic Studies: Cr. 1.05 K+ 3.8  CHEST - 2 VIEW  Comparison: Chest x-ray of 04/11/2010  Findings: No active infiltrate or effusion is seen. A calcified  granuloma in the right upper lobe is stable. Mediastinal contours  appear stable. The heart is mildly enlarged. There is a moderate  sized hiatal hernia present. There are degenerative changes  throughout the thoracic spine.  IMPRESSION:  No active lung disease. Moderate sized hiatal hernia.    Vascular Lab Bilateral: No evidence of hemodynamically significant internal carotid artery stenosis. Vertebral artery flow is antegrade.      Discharge Exam: Blood pressure 143/70, pulse 77, temperature 98 F (36.7 C), temperature source Oral, resp. rate 18, height 6' (1.829 m), weight 113.3 kg (249 lb 12.5 oz), SpO2 98.00%.   PE: General:alert and oriented, no complaints  Heart:S1S2 RRR  Lungs:clear without rales, rhonchi or wheezes  Abd:+ BS, soft, non tender  Ext:rt. Radial without hematoma, no lower ext edema     Disposition: Home  NTG sl. Was added to medications.    Medication List     As of 01/12/2012  2:54 PM    STOP taking these medications         tadalafil 5 MG tablet   Commonly known as: CIALIS      TAKE these medications         allopurinol 300 MG tablet   Commonly known as: ZYLOPRIM   Take 300 mg by mouth daily.      aspirin EC 81 MG tablet   Take 81 mg by mouth daily.      calcium carbonate 750 MG chewable tablet   Commonly known as: TUMS EX   Chew 2 tablets by mouth daily.      desmopressin 0.2 MG tablet   Commonly known as: DDAVP   Take 0.2 mg by mouth at bedtime.      loperamide 2 MG capsule   Commonly known as: IMODIUM   Take 6 mg by mouth daily.      losartan 50 MG tablet   Commonly known as: COZAAR   Take 50 mg by mouth daily.      multivitamin with minerals tablet   Take 1 tablet by mouth daily.      nitroGLYCERIN 0.4 MG SL tablet   Commonly known as:  NITROSTAT   Place 1 tablet (0.4 mg total) under the tongue every 5 (five) minutes as needed for chest pain.      nortriptyline 10 MG capsule   Commonly known as: PAMELOR   Take 10 mg by mouth at bedtime.      OVER THE COUNTER MEDICATION   Take 5 g by mouth daily. Fiber Well      pantoprazole 40 MG tablet   Commonly known as: PROTONIX   Take 40 mg by mouth daily.         Follow-up Information    Follow up with HARDING,DAVID W, MD. (we will arrange after your surgery, but call if any problems prior to surgery)    Contact information:   [facility] [address] [place] [address] [phone]         Discharge instructions:  You will have surgery 01/18/12.  Dr. Zenaida Niece Trigt's office number [phone] they will call you concerning labs Take 1 NTG, under your tongue, while sitting.  If no relief of pain may repeat NTG, one tab every 5 minutes up to 3 tablets total over 15 minutes.  If no relief CALL 911.  If you have dizziness/lightheadness  while taking NTG, stop taking and call 911.          SignedLeone Brand 01/12/2012, 2:54 PM

## 2012-01-12 NOTE — Progress Notes (Signed)
Discharge instructions given to pt. Educated pt on use of Nitro, pt verbalized understanding. Pt states he always has chest tightness, but will take if it feels different to him. Pt is aware of appointments next week and CABG on 10/31. Pt is stable for discharge with wife.

## 2012-01-13 NOTE — Consult Note (Signed)
NAME:  Lucas Morales, SCHNECK NO.:  [id number]  MEDICAL RECORD NO.:  [id number]  LOCATION:  [facility]                        FACILITY:  [facility]  PHYSICIAN:  Kerin Perna, M.D.  DATE OF BIRTH:  01/27/1934  DATE OF CONSULTATION:  01/12/2012 DATE OF DISCHARGE:                                CONSULTATION   PHYSICIAN REQUESTING CONSULTATION:  Nanetta Batty, M.D.  REASON FOR CONSULTATION:  Severe multivessel coronary artery disease with progressive class III angina.  CHIEF COMPLAINT:  Dyspnea on exertion and chest discomfort with exertion.  HISTORY OF PRESENT ILLNESS:  I was asked to evaluate this 78 year old Caucasian male for recently diagnosed severe multivessel coronary artery disease.  The patient has had progressive exertional dyspnea and decrease in exercise tolerance.  He also has had exertional chest heaviness or pressure.  His symptoms are relieved by rest.  He denies any resting symptoms of angina, orthopnea, or PND.  He underwent a myocardial stress test, which was positive.  He underwent cardiac catheterizations yesterday showing high-grade greater than 90% stenosis of the posterolateral branch of right coronary, high-grade distal circumflex, and OM stenosis of 90% and a proximal LAD stenosis of 80% to 90%.  Ejection fraction is 50% to 55%.  A 2D echo is pending to assess valvular heart disease, but by cath there was no evidence of aortic stenosis or mitral regurgitation.  The patient is currently stable in the hospital following cardiac catheterization, which was performed via right radial artery.  His risk factors include hypertension, hyperlipidemia, and mild obesity.  PAST MEDICAL HISTORY: 1. History of GERD and esophageal stricture. 2. Interstitial cystitis. 3. Hypertension. 4. Dyslipidemia. 5. Status post partial colectomy for benign polyp disease and     diverticulosis. 6. Status post bilateral knee replacement. 7. Status post cholecystectomy,  right colectomy, appendectomy, and     lumbar laminectomy.  SOCIAL HISTORY:  The patient is retired.  He has never smoked.  He drinks alcohol occasionally.  HOME MEDICATIONS: 1. Allopurinol 300 mg daily. 2. Aspirin 81 mg daily. 3. Tums 750 mg 2 tablets daily. 4. DDAVP 0.2 mg p.o. at bedtime. 5. Imodium 2 mg daily. 6. Cozaar 50 mg daily. 7. Multivitamin. 8. Nortriptyline 10 mg at bedtime. 9. Protonix 40 mg daily. 10.Cialis 5 mg daily.  ALLERGIES:  MORPHINE causes itching without rash.  REVIEW OF SYSTEMS:  CONSTITUTIONAL:  Negative for fever or weight loss. HEENT:  Negative for change in vision, difficulty swallowing, or active dental complaints.  THORAX:  Negative for history of chest trauma or abnormal chest x-ray findings.  CARDIAC:  Positive for history of coronary artery disease.  Negative for history of heart valve disease, LV dysfunction, heart failure, or arrhythmias.  GI:  Positive for his previous colon polyps and right hemicolectomy for benign polyp disease. ENDOCRINE:  Negative for diabetes, but he does watch his sweet intake. VASCULAR:  Negative for DVT, claudication, or TIA.  Carotid Dopplers have been performed, report is pending.  NEUROLOGIC:  Negative for stroke or seizure.  PHYSICAL EXAMINATION:  VITAL SIGNS:  The patient is 6 feet tall, weighs 114 kg-250 pounds, blood pressure 140/90, pulse 85, temperature 36.3, oxygen saturation on room air 97%. GENERAL APPEARANCE:  A 78 year old Caucasian male, alert, comfortable in no acute distress. HEENT:  Normocephalic.  Pupils equal.  Dentition good. NECK:  Without JVD, mass, or bruit. THORAX:  Without deformity, tenderness.  Clear breath sounds bilaterally. CARDIAC:  Regular rhythm without murmur, rub, or gallop. ABDOMEN:  Soft, nontender without pulsatile mass. EXTREMITIES:  Without cyanosis, clubbing, tenderness, or edema. VASCULAR:  2+ pulses in all extremities.  No evidence of venous insufficiency of the  legs. NEUROLOGIC:  Alert and oriented.  Normal gait.  No focal motor deficit.  LABORATORY DATA:  I reviewed the coronary arteriograms performed by Dr. Herbie Baltimore and he had severe multivessel coronary artery disease with fairly well-preserved LV function.  IMPRESSION/PLAN:  The patient will be prepared for multivessel bypass grafting on Thursday, January 18, 2012.  His preoperative PFTs show good mechanics and good diffusion capacity.  His Doppler studies are pending. A barium swallow due to his history of esophageal stricture will be requested.  His 2D echo will be reviewed.  I discussed the indications, benefits, and risks of coronary artery bypass grafting for treatment of his severe multivessel coronary artery disease including the alternatives of surgery, the expected recovery, the risks of MI, stroke, infection, and death.  He understands and agrees to proceed with surgery and understands that with grafting of his severe multivessel disease, his exercise tolerance significantly improved and his risk for myocardial infarction will be significantly reduced.     Kerin Perna, M.D.     PV/MEDQ  D:  01/12/2012  T:  01/13/2012  Job:  [id number]  cc:   Nanetta Batty, M.D.

## 2012-01-17 ENCOUNTER — Other Ambulatory Visit: Payer: Self-pay | Admitting: *Deleted

## 2012-01-17 ENCOUNTER — Encounter (HOSPITAL_COMMUNITY): Payer: Self-pay

## 2012-01-17 ENCOUNTER — Encounter (HOSPITAL_COMMUNITY)
Admission: RE | Admit: 2012-01-17 | Discharge: 2012-01-17 | Disposition: A | Discharge status: Home / Self Care | Payer: Medicare Other | Source: Ambulatory Visit | Attending: Cardiothoracic Surgery | Admitting: Cardiothoracic Surgery

## 2012-01-17 ENCOUNTER — Ambulatory Visit (HOSPITAL_COMMUNITY): Payer: Medicare Other

## 2012-01-17 VITALS — BP 133/75 | HR 70 | Temp 97.8°F | Resp 20 | Ht 75.0 in | Wt 247.6 lb

## 2012-01-17 DIAGNOSIS — I2510 Atherosclerotic heart disease of native coronary artery without angina pectoris: Secondary | ICD-10-CM

## 2012-01-17 HISTORY — DX: Pain in unspecified joint: M25.50

## 2012-01-17 HISTORY — DX: Insomnia, unspecified: G47.00

## 2012-01-17 HISTORY — DX: Personal history of colonic polyps: Z86.010

## 2012-01-17 HISTORY — DX: Major depressive disorder, single episode, unspecified: F32.9

## 2012-01-17 HISTORY — DX: Effusion, unspecified joint: M25.40

## 2012-01-17 HISTORY — DX: Frequency of micturition: R35.0

## 2012-01-17 HISTORY — DX: Depression, unspecified: F32.A

## 2012-01-17 HISTORY — DX: Personal history of colon polyps, unspecified: Z86.0100

## 2012-01-17 HISTORY — DX: Nocturia: R35.1

## 2012-01-17 LAB — SURGICAL PCR SCREEN
MRSA, PCR: NEGATIVE
Staphylococcus aureus: NEGATIVE

## 2012-01-17 MED ORDER — MAGNESIUM SULFATE 50 % IJ SOLN
40.0000 meq | INTRAMUSCULAR | Status: DC
  Filled 2012-01-17: qty 10

## 2012-01-17 MED ORDER — SODIUM CHLORIDE 0.9 % IV SOLN
INTRAVENOUS | Status: AC
  Administered 2012-01-18: 70 mL/h via INTRAVENOUS
  Filled 2012-01-17: qty 40

## 2012-01-17 MED ORDER — PLASMA-LYTE 148 IV SOLN
INTRAVENOUS | Status: AC
  Administered 2012-01-18: 09:00:00
  Filled 2012-01-17: qty 2.5

## 2012-01-17 MED ORDER — EPINEPHRINE HCL 1 MG/ML IJ SOLN
0.5000 ug/min | INTRAVENOUS | Status: DC
  Filled 2012-01-17: qty 4

## 2012-01-17 MED ORDER — DEXMEDETOMIDINE HCL IN NACL 400 MCG/100ML IV SOLN
0.4000 ug/kg/h | INTRAVENOUS | Status: DC
  Administered 2012-01-18: 0.5 ug/kg/h via INTRAVENOUS
  Filled 2012-01-17: qty 100

## 2012-01-17 MED ORDER — VANCOMYCIN HCL 1000 MG IV SOLR
1500.0000 mg | INTRAVENOUS | Status: DC
  Filled 2012-01-17: qty 1500

## 2012-01-17 MED ORDER — NITROGLYCERIN IN D5W 200-5 MCG/ML-% IV SOLN
2.0000 ug/min | INTRAVENOUS | Status: AC
  Administered 2012-01-18: 5 ug/min via INTRAVENOUS
  Filled 2012-01-17: qty 250

## 2012-01-17 MED ORDER — CHLORHEXIDINE GLUCONATE 4 % EX LIQD
30.0000 mL | CUTANEOUS | Status: DC
  Administered 2012-01-18: 2 via TOPICAL

## 2012-01-17 MED ORDER — DOPAMINE-DEXTROSE 3.2-5 MG/ML-% IV SOLN
2.0000 ug/kg/min | INTRAVENOUS | Status: DC
  Administered 2012-01-18: 3 ug/kg/min via INTRAVENOUS
  Filled 2012-01-17: qty 250

## 2012-01-17 MED ORDER — METOPROLOL TARTRATE 12.5 MG HALF TABLET
12.5000 mg | ORAL_TABLET | Freq: Once | ORAL | Status: AC
  Administered 2012-01-18: 12.5 mg via ORAL
  Filled 2012-01-17: qty 1

## 2012-01-17 MED ORDER — POTASSIUM CHLORIDE 2 MEQ/ML IV SOLN
80.0000 meq | INTRAVENOUS | Status: DC
  Filled 2012-01-17: qty 40

## 2012-01-17 MED ORDER — CEFUROXIME SODIUM 1.5 G IJ SOLR
1.5000 g | INTRAMUSCULAR | Status: AC
  Administered 2012-01-18: 1.5 g via INTRAVENOUS
  Filled 2012-01-17: qty 1.5

## 2012-01-17 MED ORDER — PHENYLEPHRINE HCL 10 MG/ML IJ SOLN
30.0000 ug/min | INTRAVENOUS | Status: AC
  Administered 2012-01-18: 10 ug/min via INTRAVENOUS
  Filled 2012-01-17: qty 2

## 2012-01-17 MED ORDER — DEXTROSE 5 % IV SOLN
750.0000 mg | INTRAVENOUS | Status: DC
  Filled 2012-01-17: qty 750

## 2012-01-17 MED ORDER — SODIUM CHLORIDE 0.9 % IV SOLN
INTRAVENOUS | Status: AC
  Administered 2012-01-18: 1.7 [IU]/h via INTRAVENOUS
  Filled 2012-01-17: qty 1

## 2012-01-17 NOTE — Progress Notes (Signed)
Dr.Jonathan Allyson Sabal is cardiologist last visit 2wks ago  Echo done 2wks ago with Dr.Berry-to request report  Stress test done 5wks ago  Heart cath in epic from 01/11/12  CXR in epic from 01/08/12

## 2012-01-17 NOTE — Pre-Procedure Instructions (Signed)
20 Lucas Morales  01/17/2012   Your procedure is scheduled on:  Thurs, Oct 31 @ 7:30 AM  Report to [facility] at 5:30 AM.  Call this number if you have problems the morning of surgery: [phone]   Remember:   Do not eat food:After Midnight.  Take these medicines the morning of surgery with A SIP OF WATER: Allopurinol(Zyloprim) and Pantoprazole(Protonix)   Do not wear jewelry.  Do not wear lotions, powders, or colognes. You may wear deodorant.  Men may shave face and neck.  Do not bring valuables to the hospital.  Contacts, dentures or bridgework may not be worn into surgery.  Leave suitcase in the car. After surgery it may be brought to your room.  For patients admitted to the hospital, checkout time is 11:00 AM the day of discharge.   Patients discharged the day of surgery will not be allowed to drive home.    Special Instructions: Incentive Spirometry - Practice and bring it with you on the day of surgery. Shower using CHG 2 nights before surgery and the night before surgery.  If you shower the day of surgery use CHG.  Use special wash - you have one bottle of CHG for all showers.  You should use approximately 1/3 of the bottle for each shower.   Please read over the following fact sheets that you were given: Pain Booklet, Coughing and Deep Breathing, Blood Transfusion Information, Open Heart Packet, MRSA Information and Surgical Site Infection Prevention

## 2012-01-18 ENCOUNTER — Encounter (HOSPITAL_COMMUNITY)
Admission: RE | Disposition: A | Discharge status: Home / Self Care | Payer: Self-pay | Source: Ambulatory Visit | Attending: Cardiothoracic Surgery

## 2012-01-18 ENCOUNTER — Encounter (HOSPITAL_COMMUNITY): Payer: Self-pay | Admitting: Anesthesiology

## 2012-01-18 ENCOUNTER — Inpatient Hospital Stay (HOSPITAL_COMMUNITY): Payer: Medicare Other

## 2012-01-18 ENCOUNTER — Ambulatory Visit (HOSPITAL_COMMUNITY): Payer: Medicare Other | Admitting: Anesthesiology

## 2012-01-18 ENCOUNTER — Inpatient Hospital Stay (HOSPITAL_COMMUNITY)
Admission: RE | Admit: 2012-01-18 | Discharge: 2012-01-27 | DRG: 236 | Disposition: A | Discharge status: Home / Self Care | Payer: Medicare Other | Source: Ambulatory Visit | Attending: Cardiothoracic Surgery | Admitting: Cardiothoracic Surgery

## 2012-01-18 DIAGNOSIS — E119 Type 2 diabetes mellitus without complications: Secondary | ICD-10-CM | POA: Diagnosis present

## 2012-01-18 DIAGNOSIS — Z951 Presence of aortocoronary bypass graft: Secondary | ICD-10-CM

## 2012-01-18 DIAGNOSIS — I2510 Atherosclerotic heart disease of native coronary artery without angina pectoris: Principal | ICD-10-CM | POA: Diagnosis present

## 2012-01-18 DIAGNOSIS — E785 Hyperlipidemia, unspecified: Secondary | ICD-10-CM | POA: Diagnosis present

## 2012-01-18 DIAGNOSIS — I10 Essential (primary) hypertension: Secondary | ICD-10-CM | POA: Diagnosis present

## 2012-01-18 DIAGNOSIS — I480 Paroxysmal atrial fibrillation: Secondary | ICD-10-CM | POA: Diagnosis not present

## 2012-01-18 DIAGNOSIS — Z01812 Encounter for preprocedural laboratory examination: Secondary | ICD-10-CM

## 2012-01-18 DIAGNOSIS — I200 Unstable angina: Secondary | ICD-10-CM | POA: Diagnosis present

## 2012-01-18 DIAGNOSIS — I498 Other specified cardiac arrhythmias: Secondary | ICD-10-CM | POA: Diagnosis present

## 2012-01-18 DIAGNOSIS — D62 Acute posthemorrhagic anemia: Secondary | ICD-10-CM | POA: Diagnosis present

## 2012-01-18 DIAGNOSIS — K219 Gastro-esophageal reflux disease without esophagitis: Secondary | ICD-10-CM | POA: Diagnosis present

## 2012-01-18 HISTORY — PX: ENDOVEIN HARVEST OF GREATER SAPHENOUS VEIN: SHX5059

## 2012-01-18 HISTORY — PX: CORONARY ARTERY BYPASS GRAFT: SHX141

## 2012-01-18 LAB — POCT I-STAT, CHEM 8
BUN: 18 mg/dL (ref 6–23)
Calcium, Ion: 1.19 mmol/L (ref 1.13–1.30)
Chloride: 108 mEq/L (ref 96–112)
Creatinine, Ser: 1.1 mg/dL (ref 0.50–1.35)
Glucose, Bld: 124 mg/dL — ABNORMAL HIGH (ref 70–99)
HCT: 25 % — ABNORMAL LOW (ref 39.0–52.0)
Hemoglobin: 8.5 g/dL — ABNORMAL LOW (ref 13.0–17.0)
Potassium: 4.5 mEq/L (ref 3.5–5.1)
Sodium: 143 mEq/L (ref 135–145)
TCO2: 21 mmol/L (ref 0–100)

## 2012-01-18 LAB — PREPARE PLATELET PHERESIS: Unit division: 0

## 2012-01-18 LAB — URINALYSIS, ROUTINE W REFLEX MICROSCOPIC
Bilirubin Urine: NEGATIVE
Glucose, UA: NEGATIVE mg/dL
Hgb urine dipstick: NEGATIVE
Ketones, ur: NEGATIVE mg/dL
Leukocytes, UA: NEGATIVE
Nitrite: NEGATIVE
Protein, ur: NEGATIVE mg/dL
Specific Gravity, Urine: 1.021 (ref 1.005–1.030)
Urobilinogen, UA: 0.2 mg/dL (ref 0.0–1.0)
pH: 5 (ref 5.0–8.0)

## 2012-01-18 LAB — POCT I-STAT 3, ART BLOOD GAS (G3+)
Acid-base deficit: 1 mmol/L (ref 0.0–2.0)
Acid-base deficit: 4 mmol/L — ABNORMAL HIGH (ref 0.0–2.0)
Acid-base deficit: 2 mmol/L (ref 0.0–2.0)
Acid-base deficit: 2 mmol/L (ref 0.0–2.0)
Acid-base deficit: 3 mmol/L — ABNORMAL HIGH (ref 0.0–2.0)
Bicarbonate: 24.7 mEq/L — ABNORMAL HIGH (ref 20.0–24.0)
Bicarbonate: 21.5 mEq/L (ref 20.0–24.0)
Bicarbonate: 22.4 mEq/L (ref 20.0–24.0)
Bicarbonate: 22.8 mEq/L (ref 20.0–24.0)
Bicarbonate: 21.6 mEq/L (ref 20.0–24.0)
O2 Saturation: 100 %
O2 Saturation: 100 %
O2 Saturation: 95 %
O2 Saturation: 99 %
O2 Saturation: 94 %
Patient temperature: 35.5
Patient temperature: 37.1
Patient temperature: 37.4
TCO2: 26 mmol/L (ref 0–100)
TCO2: 23 mmol/L (ref 0–100)
TCO2: 24 mmol/L (ref 0–100)
TCO2: 24 mmol/L (ref 0–100)
TCO2: 23 mmol/L (ref 0–100)
pCO2 arterial: 42 mmHg (ref 35.0–45.0)
pCO2 arterial: 39 mmHg (ref 35.0–45.0)
pCO2 arterial: 35.2 mmHg (ref 35.0–45.0)
pCO2 arterial: 37.1 mmHg (ref 35.0–45.0)
pCO2 arterial: 38.5 mmHg (ref 35.0–45.0)
pH, Arterial: 7.376 (ref 7.350–7.450)
pH, Arterial: 7.35 (ref 7.350–7.450)
pH, Arterial: 7.405 (ref 7.350–7.450)
pH, Arterial: 7.397 (ref 7.350–7.450)
pH, Arterial: 7.359 (ref 7.350–7.450)
pO2, Arterial: 303 mmHg — ABNORMAL HIGH (ref 80.0–100.0)
pO2, Arterial: 195 mmHg — ABNORMAL HIGH (ref 80.0–100.0)
pO2, Arterial: 70 mmHg — ABNORMAL LOW (ref 80.0–100.0)
pO2, Arterial: 124 mmHg — ABNORMAL HIGH (ref 80.0–100.0)
pO2, Arterial: 74 mmHg — ABNORMAL LOW (ref 80.0–100.0)

## 2012-01-18 LAB — POCT I-STAT 4, (NA,K, GLUC, HGB,HCT)
Glucose, Bld: 117 mg/dL — ABNORMAL HIGH (ref 70–99)
Glucose, Bld: 111 mg/dL — ABNORMAL HIGH (ref 70–99)
Glucose, Bld: 117 mg/dL — ABNORMAL HIGH (ref 70–99)
Glucose, Bld: 154 mg/dL — ABNORMAL HIGH (ref 70–99)
Glucose, Bld: 156 mg/dL — ABNORMAL HIGH (ref 70–99)
Glucose, Bld: 181 mg/dL — ABNORMAL HIGH (ref 70–99)
Glucose, Bld: 125 mg/dL — ABNORMAL HIGH (ref 70–99)
HCT: 35 % — ABNORMAL LOW (ref 39.0–52.0)
HCT: 31 % — ABNORMAL LOW (ref 39.0–52.0)
HCT: 35 % — ABNORMAL LOW (ref 39.0–52.0)
HCT: 28 % — ABNORMAL LOW (ref 39.0–52.0)
HCT: 28 % — ABNORMAL LOW (ref 39.0–52.0)
HCT: 31 % — ABNORMAL LOW (ref 39.0–52.0)
HCT: 30 % — ABNORMAL LOW (ref 39.0–52.0)
Hemoglobin: 11.9 g/dL — ABNORMAL LOW (ref 13.0–17.0)
Hemoglobin: 10.5 g/dL — ABNORMAL LOW (ref 13.0–17.0)
Hemoglobin: 11.9 g/dL — ABNORMAL LOW (ref 13.0–17.0)
Hemoglobin: 9.5 g/dL — ABNORMAL LOW (ref 13.0–17.0)
Hemoglobin: 9.5 g/dL — ABNORMAL LOW (ref 13.0–17.0)
Hemoglobin: 10.5 g/dL — ABNORMAL LOW (ref 13.0–17.0)
Hemoglobin: 10.2 g/dL — ABNORMAL LOW (ref 13.0–17.0)
Potassium: 3.8 mEq/L (ref 3.5–5.1)
Potassium: 3.4 mEq/L — ABNORMAL LOW (ref 3.5–5.1)
Potassium: 3.9 mEq/L (ref 3.5–5.1)
Potassium: 4.2 mEq/L (ref 3.5–5.1)
Potassium: 4.7 mEq/L (ref 3.5–5.1)
Potassium: 4.1 mEq/L (ref 3.5–5.1)
Potassium: 3.4 mEq/L — ABNORMAL LOW (ref 3.5–5.1)
Sodium: 142 mEq/L (ref 135–145)
Sodium: 143 mEq/L (ref 135–145)
Sodium: 143 mEq/L (ref 135–145)
Sodium: 140 mEq/L (ref 135–145)
Sodium: 140 mEq/L (ref 135–145)
Sodium: 141 mEq/L (ref 135–145)
Sodium: 144 mEq/L (ref 135–145)

## 2012-01-18 LAB — PROTIME-INR
INR: 1.21 (ref 0.00–1.49)
INR: 1.39 (ref 0.00–1.49)
Prothrombin Time: 15.1 seconds (ref 11.6–15.2)
Prothrombin Time: 16.7 seconds — ABNORMAL HIGH (ref 11.6–15.2)

## 2012-01-18 LAB — CBC
HCT: 29.5 % — ABNORMAL LOW (ref 39.0–52.0)
HCT: 28.5 % — ABNORMAL LOW (ref 39.0–52.0)
Hemoglobin: 9.6 g/dL — ABNORMAL LOW (ref 13.0–17.0)
MCH: 29 pg (ref 26.0–34.0)
MCHC: 34.6 g/dL (ref 30.0–36.0)
MCHC: 33.7 g/dL (ref 30.0–36.0)
MCV: 86.1 fL (ref 78.0–100.0)
Platelets: 119 10*3/uL — ABNORMAL LOW (ref 150–400)
RBC: 3.31 MIL/uL — ABNORMAL LOW (ref 4.22–5.81)
RDW: 16.6 % — ABNORMAL HIGH (ref 11.5–15.5)
RDW: 16.9 % — ABNORMAL HIGH (ref 11.5–15.5)
WBC: 6 10*3/uL (ref 4.0–10.5)
WBC: 8.3 10*3/uL (ref 4.0–10.5)

## 2012-01-18 LAB — POCT I-STAT GLUCOSE
Glucose, Bld: 118 mg/dL — ABNORMAL HIGH (ref 70–99)
Operator id: 173792

## 2012-01-18 LAB — CREATININE, SERUM
Creatinine, Ser: 0.97 mg/dL (ref 0.50–1.35)
GFR calc Af Amer: >90 mL/min (ref >90)
GFR calc non Af Amer: 79 mL/min — ABNORMAL LOW (ref >90)

## 2012-01-18 LAB — GLUCOSE, CAPILLARY: Glucose-Capillary: 126 mg/dL — ABNORMAL HIGH (ref 70–99)

## 2012-01-18 LAB — HEMOGLOBIN AND HEMATOCRIT, BLOOD
HCT: 28.2 % — ABNORMAL LOW (ref 39.0–52.0)
Hemoglobin: 9.6 g/dL — ABNORMAL LOW (ref 13.0–17.0)

## 2012-01-18 LAB — PLATELET COUNT: Platelets: 99 10*3/uL — ABNORMAL LOW (ref 150–400)

## 2012-01-18 LAB — APTT: aPTT: 35 seconds (ref 24–37)

## 2012-01-18 LAB — MAGNESIUM: Magnesium: 2.7 mg/dL — ABNORMAL HIGH (ref 1.5–2.5)

## 2012-01-18 SURGERY — CORONARY ARTERY BYPASS GRAFTING (CABG)
Anesthesia: General | Site: Leg Lower | Laterality: Right | Wound class: Clean

## 2012-01-18 MED ORDER — ACETAMINOPHEN 10 MG/ML IV SOLN
1000.0000 mg | Freq: Once | INTRAVENOUS | Status: AC
  Administered 2012-01-18: 1000 mg via INTRAVENOUS
  Filled 2012-01-18: qty 100

## 2012-01-18 MED ORDER — LACTATED RINGERS IV SOLN: 500.0000 mL | Freq: Once | INTRAVENOUS | Status: AC | PRN

## 2012-01-18 MED ORDER — LACTATED RINGERS IV SOLN
INTRAVENOUS | Status: DC
  Administered 2012-01-18: 15:00:00 via INTRAVENOUS

## 2012-01-18 MED ORDER — PROPOFOL 10 MG/ML IV BOLUS
INTRAVENOUS | Status: DC | PRN
  Administered 2012-01-18: 100 mg via INTRAVENOUS

## 2012-01-18 MED ORDER — DEXTROSE 5 % IV SOLN
1.5000 g | Freq: Two times a day (BID) | INTRAVENOUS | Status: DC
  Administered 2012-01-18 – 2012-01-19 (×3): 1.5 g via INTRAVENOUS
  Filled 2012-01-18 (×4): qty 1.5

## 2012-01-18 MED ORDER — HEMOSTATIC AGENTS (NO CHARGE) OPTIME
TOPICAL | Status: DC | PRN
  Administered 2012-01-18: 1 via TOPICAL

## 2012-01-18 MED ORDER — ASPIRIN EC 325 MG PO TBEC
325.0000 mg | DELAYED_RELEASE_TABLET | Freq: Every day | ORAL | Status: DC
  Administered 2012-01-19: 325 mg via ORAL
  Filled 2012-01-18 (×2): qty 1

## 2012-01-18 MED ORDER — METOPROLOL TARTRATE 25 MG/10 ML ORAL SUSPENSION
12.5000 mg | Freq: Two times a day (BID) | ORAL | Status: DC
  Filled 2012-01-18 (×5): qty 5

## 2012-01-18 MED ORDER — ALBUMIN HUMAN 5 % IV SOLN
INTRAVENOUS | Status: DC | PRN
  Administered 2012-01-18: 13:00:00 via INTRAVENOUS

## 2012-01-18 MED ORDER — METOPROLOL TARTRATE 1 MG/ML IV SOLN: 2.5000 mg | INTRAVENOUS | Status: DC | PRN

## 2012-01-18 MED ORDER — SODIUM CHLORIDE 0.9 % IJ SOLN
3.0000 mL | Freq: Two times a day (BID) | INTRAMUSCULAR | Status: DC
  Administered 2012-01-19 (×2): 3 mL via INTRAVENOUS

## 2012-01-18 MED ORDER — ROCURONIUM BROMIDE 100 MG/10ML IV SOLN
INTRAVENOUS | Status: DC | PRN
  Administered 2012-01-18: 30 mg via INTRAVENOUS
  Administered 2012-01-18: 100 mg via INTRAVENOUS
  Administered 2012-01-18: 30 mg via INTRAVENOUS

## 2012-01-18 MED ORDER — MIDAZOLAM HCL 2 MG/2ML IJ SOLN
2.0000 mg | INTRAMUSCULAR | Status: DC | PRN
  Administered 2012-01-19: 2 mg via INTRAVENOUS
  Filled 2012-01-18: qty 2

## 2012-01-18 MED ORDER — LACTATED RINGERS IV SOLN
INTRAVENOUS | Status: DC | PRN
  Administered 2012-01-18 (×2): via INTRAVENOUS

## 2012-01-18 MED ORDER — ASPIRIN 81 MG PO CHEW: 324.0000 mg | CHEWABLE_TABLET | Freq: Every day | ORAL | Status: DC

## 2012-01-18 MED ORDER — BISACODYL 10 MG RE SUPP: 10.0000 mg | Freq: Every day | RECTAL | Status: DC

## 2012-01-18 MED ORDER — HEPARIN SODIUM (PORCINE) 1000 UNIT/ML IJ SOLN
INTRAMUSCULAR | Status: DC | PRN
  Administered 2012-01-18: 30000 [IU] via INTRAVENOUS
  Administered 2012-01-18: 4000 [IU] via INTRAVENOUS

## 2012-01-18 MED ORDER — SODIUM CHLORIDE 0.9 % IV SOLN: INTRAVENOUS | Status: DC

## 2012-01-18 MED ORDER — LACTATED RINGERS IV SOLN
INTRAVENOUS | Status: DC | PRN
  Administered 2012-01-18: 07:00:00 via INTRAVENOUS

## 2012-01-18 MED ORDER — ACETAMINOPHEN 160 MG/5ML PO SOLN: 975.0000 mg | Freq: Four times a day (QID) | ORAL | Status: DC

## 2012-01-18 MED ORDER — ALLOPURINOL 300 MG PO TABS
300.0000 mg | ORAL_TABLET | Freq: Every day | ORAL | Status: DC
  Administered 2012-01-19: 300 mg via ORAL
  Filled 2012-01-18 (×2): qty 1

## 2012-01-18 MED ORDER — OXYCODONE HCL 5 MG PO TABS
5.0000 mg | ORAL_TABLET | ORAL | Status: DC | PRN
  Administered 2012-01-19 (×2): 10 mg via ORAL
  Filled 2012-01-18 (×2): qty 2

## 2012-01-18 MED ORDER — SODIUM CHLORIDE 0.45 % IV SOLN
INTRAVENOUS | Status: DC
  Administered 2012-01-18: 15:00:00 via INTRAVENOUS

## 2012-01-18 MED ORDER — DOCUSATE SODIUM 100 MG PO CAPS
200.0000 mg | ORAL_CAPSULE | Freq: Every day | ORAL | Status: DC
  Administered 2012-01-19: 200 mg via ORAL
  Filled 2012-01-18: qty 2

## 2012-01-18 MED ORDER — SUFENTANIL CITRATE 50 MCG/ML IV SOLN
INTRAVENOUS | Status: DC | PRN
  Administered 2012-01-18: 50 ug via INTRAVENOUS
  Administered 2012-01-18: 30 ug via INTRAVENOUS
  Administered 2012-01-18: 10 ug via INTRAVENOUS
  Administered 2012-01-18 (×2): 50 ug via INTRAVENOUS
  Administered 2012-01-18 (×2): 25 ug via INTRAVENOUS
  Administered 2012-01-18: 10 ug via INTRAVENOUS

## 2012-01-18 MED ORDER — MIDAZOLAM HCL 5 MG/5ML IJ SOLN
INTRAMUSCULAR | Status: DC | PRN
  Administered 2012-01-18: 2 mg via INTRAVENOUS
  Administered 2012-01-18 (×2): 4 mg via INTRAVENOUS

## 2012-01-18 MED ORDER — ACETAMINOPHEN 500 MG PO TABS
1000.0000 mg | ORAL_TABLET | Freq: Four times a day (QID) | ORAL | Status: DC
  Administered 2012-01-19 – 2012-01-20 (×5): 1000 mg via ORAL
  Filled 2012-01-18 (×8): qty 2

## 2012-01-18 MED ORDER — SODIUM CHLORIDE 0.9 % IJ SOLN: 3.0000 mL | INTRAMUSCULAR | Status: DC | PRN

## 2012-01-18 MED ORDER — BISACODYL 5 MG PO TBEC
10.0000 mg | DELAYED_RELEASE_TABLET | Freq: Every day | ORAL | Status: DC
  Administered 2012-01-19: 10 mg via ORAL
  Filled 2012-01-18 (×2): qty 2

## 2012-01-18 MED ORDER — VANCOMYCIN HCL IN DEXTROSE 1-5 GM/200ML-% IV SOLN
1000.0000 mg | Freq: Once | INTRAVENOUS | Status: AC
  Administered 2012-01-18: 1000 mg via INTRAVENOUS
  Filled 2012-01-18: qty 200

## 2012-01-18 MED ORDER — INSULIN REGULAR BOLUS VIA INFUSION
0.0000 [IU] | Freq: Three times a day (TID) | INTRAVENOUS | Status: DC
  Administered 2012-01-20: 3 [IU] via INTRAVENOUS
  Filled 2012-01-18: qty 10

## 2012-01-18 MED ORDER — POTASSIUM CHLORIDE 10 MEQ/50ML IV SOLN
10.0000 meq | Freq: Once | INTRAVENOUS | Status: AC
  Administered 2012-01-18: 10 meq via INTRAVENOUS
  Filled 2012-01-18: qty 50

## 2012-01-18 MED ORDER — SODIUM CHLORIDE 0.9 % IV SOLN
20.0000 ug | INTRAVENOUS | Status: DC | PRN
  Administered 2012-01-18: 15 ug via INTRAVENOUS

## 2012-01-18 MED ORDER — FAMOTIDINE IN NACL 20-0.9 MG/50ML-% IV SOLN
20.0000 mg | Freq: Two times a day (BID) | INTRAVENOUS | Status: AC
  Administered 2012-01-18: 20 mg via INTRAVENOUS

## 2012-01-18 MED ORDER — HYDROMORPHONE HCL PF 1 MG/ML IJ SOLN
1.0000 mg | INTRAMUSCULAR | Status: DC | PRN
  Administered 2012-01-18: 1 mg via INTRAVENOUS
  Administered 2012-01-18 (×2): 0.5 mg via INTRAVENOUS
  Administered 2012-01-19 (×2): 1 mg via INTRAVENOUS
  Filled 2012-01-18 (×5): qty 1

## 2012-01-18 MED ORDER — PANTOPRAZOLE SODIUM 40 MG PO TBEC
40.0000 mg | DELAYED_RELEASE_TABLET | Freq: Every day | ORAL | Status: DC
  Filled 2012-01-18 (×2): qty 1

## 2012-01-18 MED ORDER — METOPROLOL TARTRATE 12.5 MG HALF TABLET
12.5000 mg | ORAL_TABLET | Freq: Two times a day (BID) | ORAL | Status: DC
  Filled 2012-01-18 (×5): qty 1

## 2012-01-18 MED ORDER — PROTAMINE SULFATE 10 MG/ML IV SOLN
INTRAVENOUS | Status: DC | PRN
  Administered 2012-01-18: 300 mg via INTRAVENOUS

## 2012-01-18 MED ORDER — POTASSIUM CHLORIDE 10 MEQ/50ML IV SOLN
10.0000 meq | INTRAVENOUS | Status: AC
  Administered 2012-01-18 (×3): 10 meq via INTRAVENOUS

## 2012-01-18 MED ORDER — PHENYLEPHRINE HCL 10 MG/ML IJ SOLN
0.0000 ug/min | INTRAMUSCULAR | Status: DC
  Filled 2012-01-18: qty 2

## 2012-01-18 MED ORDER — MAGNESIUM SULFATE 40 MG/ML IJ SOLN
4.0000 g | Freq: Once | INTRAMUSCULAR | Status: AC
  Administered 2012-01-18: 4 g via INTRAVENOUS
  Filled 2012-01-18: qty 100

## 2012-01-18 MED ORDER — SODIUM CHLORIDE 0.9 % IV SOLN: 250.0000 mL | INTRAVENOUS | Status: DC

## 2012-01-18 MED ORDER — HEMOSTATIC AGENTS (NO CHARGE) OPTIME
TOPICAL | Status: DC | PRN
  Administered 2012-01-18: 3 via TOPICAL

## 2012-01-18 MED ORDER — DEXMEDETOMIDINE HCL IN NACL 200 MCG/50ML IV SOLN
0.1000 ug/kg/h | INTRAVENOUS | Status: DC
  Administered 2012-01-18: 0.7 ug/kg/h via INTRAVENOUS
  Filled 2012-01-18: qty 50

## 2012-01-18 MED ORDER — 0.9 % SODIUM CHLORIDE (POUR BTL) OPTIME
TOPICAL | Status: DC | PRN
  Administered 2012-01-18: 6000 mL

## 2012-01-18 MED ORDER — SODIUM CHLORIDE 0.9 % IV SOLN
INTRAVENOUS | Status: DC
  Filled 2012-01-18 (×2): qty 1

## 2012-01-18 MED ORDER — SODIUM CHLORIDE 0.9 % IV SOLN
30.0000 ug | Freq: Once | INTRAVENOUS | Status: AC
  Filled 2012-01-18: qty 7.5

## 2012-01-18 MED ORDER — NITROGLYCERIN IN D5W 200-5 MCG/ML-% IV SOLN: 0.0000 ug/min | INTRAVENOUS | Status: DC

## 2012-01-18 MED ORDER — ONDANSETRON HCL 4 MG/2ML IJ SOLN
4.0000 mg | Freq: Four times a day (QID) | INTRAMUSCULAR | Status: DC | PRN
  Administered 2012-01-19: 4 mg via INTRAVENOUS
  Filled 2012-01-18: qty 2

## 2012-01-18 MED ORDER — ALBUMIN HUMAN 5 % IV SOLN: 250.0000 mL | INTRAVENOUS | Status: AC | PRN

## 2012-01-18 SURGICAL SUPPLY — 104 items
ADAPTER CARDIO PERF ANTE/RETRO (ADAPTER) ×4 IMPLANT
ATTRACTOMAT 16X20 MAGNETIC DRP (DRAPES) ×4 IMPLANT
BAG DECANTER FOR FLEXI CONT (MISCELLANEOUS) ×4 IMPLANT
BANDAGE ELASTIC 4 VELCRO ST LF (GAUZE/BANDAGES/DRESSINGS) ×4 IMPLANT
BANDAGE ELASTIC 6 VELCRO ST LF (GAUZE/BANDAGES/DRESSINGS) ×4 IMPLANT
BANDAGE GAUZE ELAST BULKY 4 IN (GAUZE/BANDAGES/DRESSINGS) ×4 IMPLANT
BASKET HEART  (ORDER IN 25'S) (MISCELLANEOUS) ×1
BASKET HEART (ORDER IN 25'S) (MISCELLANEOUS) ×1
BASKET HEART (ORDER IN 25S) (MISCELLANEOUS) ×2 IMPLANT
BENZOIN TINCTURE PRP APPL 2/3 (GAUZE/BANDAGES/DRESSINGS) ×4 IMPLANT
BLADE STERNUM SYSTEM 6 (BLADE) ×4 IMPLANT
BLADE SURG 11 STRL SS (BLADE) ×4 IMPLANT
BLADE SURG 12 STRL SS (BLADE) ×4 IMPLANT
BLADE SURG ROTATE 9660 (MISCELLANEOUS) IMPLANT
BNDG ELASTIC 6X10 VLCR STRL LF (GAUZE/BANDAGES/DRESSINGS) ×4 IMPLANT
CANISTER SUCTION 2500CC (MISCELLANEOUS) ×4 IMPLANT
CANNULA AORTIC HI-FLOW 6.5M20F (CANNULA) ×4 IMPLANT
CANNULA ARTERIAL NVNT 3/8 22FR (MISCELLANEOUS) ×4 IMPLANT
CANNULA GUNDRY RCSP 15FR (MISCELLANEOUS) ×4 IMPLANT
CANNULA VENOUS MAL SGL STG 40 (MISCELLANEOUS) IMPLANT
CANNULAE VENOUS MAL SGL STG 40 (MISCELLANEOUS)
CATH CPB KIT VANTRIGT (MISCELLANEOUS) ×4 IMPLANT
CATH ROBINSON RED A/P 18FR (CATHETERS) ×12 IMPLANT
CATH THORACIC 28FR (CATHETERS) IMPLANT
CATH THORACIC 28FR RT ANG (CATHETERS) IMPLANT
CATH THORACIC 36FR (CATHETERS) IMPLANT
CATH THORACIC 36FR RT ANG (CATHETERS) ×8 IMPLANT
CLIP TI WIDE RED SMALL 24 (CLIP) ×4 IMPLANT
CLOSURE WOUND 1/2 X4 (GAUZE/BANDAGES/DRESSINGS) ×1
CLOTH BEACON ORANGE TIMEOUT ST (SAFETY) ×4 IMPLANT
COVER SURGICAL LIGHT HANDLE (MISCELLANEOUS) ×4 IMPLANT
CRADLE DONUT ADULT HEAD (MISCELLANEOUS) ×4 IMPLANT
DRAIN CHANNEL 32F RND 10.7 FF (WOUND CARE) ×4 IMPLANT
DRAPE CARDIOVASCULAR INCISE (DRAPES) ×2
DRAPE SLUSH MACHINE 52X66 (DRAPES) IMPLANT
DRAPE SLUSH/WARMER DISC (DRAPES) IMPLANT
DRAPE SRG 135X102X78XABS (DRAPES) ×2 IMPLANT
DRSG COVADERM 4X14 (GAUZE/BANDAGES/DRESSINGS) ×4 IMPLANT
ELECT BLADE 4.0 EZ CLEAN MEGAD (MISCELLANEOUS) ×4
ELECT BLADE 6.5 EXT (BLADE) ×4 IMPLANT
ELECT CAUTERY BLADE 6.4 (BLADE) ×4 IMPLANT
ELECT REM PT RETURN 9FT ADLT (ELECTROSURGICAL) ×8
ELECTRODE BLDE 4.0 EZ CLN MEGD (MISCELLANEOUS) ×2 IMPLANT
ELECTRODE REM PT RTRN 9FT ADLT (ELECTROSURGICAL) ×4 IMPLANT
GLOVE BIO SURGEON STRL SZ7.5 (GLOVE) ×8 IMPLANT
GOWN STRL NON-REIN LRG LVL3 (GOWN DISPOSABLE) ×16 IMPLANT
HEMOSTAT POWDER SURGIFOAM 1G (HEMOSTASIS) ×16 IMPLANT
HEMOSTAT SURGICEL 2X14 (HEMOSTASIS) ×4 IMPLANT
INSERT FOGARTY XLG (MISCELLANEOUS) IMPLANT
KIT BASIN OR (CUSTOM PROCEDURE TRAY) ×4 IMPLANT
KIT ROOM TURNOVER OR (KITS) ×4 IMPLANT
KIT SUCTION CATH 14FR (SUCTIONS) ×4 IMPLANT
KIT VASOVIEW W/TROCAR VH 2000 (KITS) ×4 IMPLANT
LEAD PACING MYOCARDI (MISCELLANEOUS) ×4 IMPLANT
MARKER GRAFT CORONARY BYPASS (MISCELLANEOUS) ×12 IMPLANT
NS IRRIG 1000ML POUR BTL (IV SOLUTION) ×20 IMPLANT
PACK OPEN HEART (CUSTOM PROCEDURE TRAY) ×4 IMPLANT
PAD ARMBOARD 7.5X6 YLW CONV (MISCELLANEOUS) ×8 IMPLANT
PENCIL BUTTON HOLSTER BLD 10FT (ELECTRODE) ×4 IMPLANT
PUNCH AORTIC ROTATE 4.0MM (MISCELLANEOUS) IMPLANT
PUNCH AORTIC ROTATE 4.5MM 8IN (MISCELLANEOUS) ×4 IMPLANT
PUNCH AORTIC ROTATE 5MM 8IN (MISCELLANEOUS) IMPLANT
SET CARDIOPLEGIA MPS 5001102 (MISCELLANEOUS) ×4 IMPLANT
SPONGE GAUZE 4X4 12PLY (GAUZE/BANDAGES/DRESSINGS) ×8 IMPLANT
STRIP CLOSURE SKIN 1/2X4 (GAUZE/BANDAGES/DRESSINGS) ×3 IMPLANT
SURGIFLO W/THROMBIN 8M KIT (HEMOSTASIS) ×4 IMPLANT
SUT BONE WAX W31G (SUTURE) ×4 IMPLANT
SUT MNCRL AB 4-0 PS2 18 (SUTURE) ×4 IMPLANT
SUT PROLENE 3 0 SH DA (SUTURE) IMPLANT
SUT PROLENE 3 0 SH1 36 (SUTURE) IMPLANT
SUT PROLENE 4 0 RB 1 (SUTURE) ×4
SUT PROLENE 4 0 SH DA (SUTURE) ×4 IMPLANT
SUT PROLENE 4-0 RB1 .5 CRCL 36 (SUTURE) ×4 IMPLANT
SUT PROLENE 5 0 C 1 36 (SUTURE) IMPLANT
SUT PROLENE 6 0 C 1 30 (SUTURE) ×8 IMPLANT
SUT PROLENE 7 0 DA (SUTURE) IMPLANT
SUT PROLENE 7.0 RB 3 (SUTURE) ×12 IMPLANT
SUT PROLENE 8 0 BV175 6 (SUTURE) IMPLANT
SUT PROLENE BLUE 7 0 (SUTURE) ×12 IMPLANT
SUT SILK  1 MH (SUTURE)
SUT SILK 1 MH (SUTURE) IMPLANT
SUT SILK 2 0 SH CR/8 (SUTURE) IMPLANT
SUT SILK 3 0 SH CR/8 (SUTURE) IMPLANT
SUT STEEL 6MS V (SUTURE) ×8 IMPLANT
SUT STEEL STERNAL CCS#1 18IN (SUTURE) IMPLANT
SUT STEEL SZ 6 DBL 3X14 BALL (SUTURE) ×4 IMPLANT
SUT VIC AB 1 CTX 36 (SUTURE) ×4
SUT VIC AB 1 CTX36XBRD ANBCTR (SUTURE) ×4 IMPLANT
SUT VIC AB 2-0 CT1 27 (SUTURE) ×2
SUT VIC AB 2-0 CT1 TAPERPNT 27 (SUTURE) ×2 IMPLANT
SUT VIC AB 2-0 CTX 27 (SUTURE) IMPLANT
SUT VIC AB 3-0 SH 27 (SUTURE)
SUT VIC AB 3-0 SH 27X BRD (SUTURE) IMPLANT
SUT VIC AB 3-0 X1 27 (SUTURE) IMPLANT
SUT VICRYL 4-0 PS2 18IN ABS (SUTURE) IMPLANT
SUTURE E-PAK OPEN HEART (SUTURE) ×4 IMPLANT
SYSTEM SAHARA CHEST DRAIN ATS (WOUND CARE) ×4 IMPLANT
TAPE CLOTH SOFT 2X10 (GAUZE/BANDAGES/DRESSINGS) ×4 IMPLANT
TOWEL OR 17X24 6PK STRL BLUE (TOWEL DISPOSABLE) ×8 IMPLANT
TOWEL OR 17X26 10 PK STRL BLUE (TOWEL DISPOSABLE) ×8 IMPLANT
TRAY FOLEY IC TEMP SENS 14FR (CATHETERS) ×4 IMPLANT
TUBING INSUFFLATION 10FT LAP (TUBING) ×4 IMPLANT
UNDERPAD 30X30 INCONTINENT (UNDERPADS AND DIAPERS) ×4 IMPLANT
WATER STERILE IRR 1000ML POUR (IV SOLUTION) ×8 IMPLANT

## 2012-01-18 NOTE — Transfer of Care (Signed)
Immediate Anesthesia Transfer of Care Note  Patient: Lucas Morales  Procedure(s) Performed: Procedure(s) (LRB) with comments: CORONARY ARTERY BYPASS GRAFTING (CABG) (N/A) - times four, using left internal mammary artery ENDOVEIN HARVEST OF GREATER SAPHENOUS VEIN (Right)  Patient Location: SICU  Anesthesia Type:General  Level of Consciousness: Patient remains intubated per anesthesia plan  Airway & Oxygen Therapy: Patient placed on Ventilator (see vital sign flow sheet for setting)  Post-op Assessment: Report given to PACU RN and Post -op Vital signs reviewed and stable  Post vital signs: Reviewed and stable  Complications: No apparent anesthesia complications

## 2012-01-18 NOTE — Procedures (Signed)
Extubation Procedure Note  Patient Details:   Name: Lucas Morales DOB: November 04, 1933 MRN: [id number]   Airway Documentation:     Evaluation  O2 sats: stable throughout and currently acceptable Complications: No apparent complications Patient did tolerate procedure well. Bilateral Breath Sounds: Clear;Diminished   Yes  NIF -20, VC 1.0.  Antoine Poche 01/18/2012, 5:00 PM

## 2012-01-18 NOTE — Brief Op Note (Signed)
01/18/2012  11:48 AM  PATIENT:  Lucas Morales  78 y.o. male  PRE-OPERATIVE DIAGNOSIS:  CAD  POST-OPERATIVE DIAGNOSIS:  Coronary Artery Disease  PROCEDURE:   CORONARY ARTERY BYPASS GRAFTING x 4 (LIMA-LAD, SVG-OM3-OM4, SVG-RCA), EVH right leg  SURGEON:  Surgeon(s): Kerin Perna, MD  ASSISTANT: Coral Ceo, PA-C  ANESTHESIA:   general  PATIENT CONDITION:  ICU - intubated and hemodynamically stable.  PRE-OPERATIVE WEIGHT: 112 kg

## 2012-01-18 NOTE — OR Nursing (Signed)
1 st call to SICU and Volunteer desk. 

## 2012-01-18 NOTE — Anesthesia Postprocedure Evaluation (Signed)
Anesthesia Post Note  Patient: Lucas Morales  Procedure(s) Performed: Procedure(s) (LRB): CORONARY ARTERY BYPASS GRAFTING (CABG) (N/A) ENDOVEIN HARVEST OF GREATER SAPHENOUS VEIN (Right)  Anesthesia type: General  Patient location: ICU  Post pain: Pain level controlled  Post assessment: Post-op Vital signs reviewed  Last Vitals:  Filed Vitals:   01/18/12 1415  BP:   Pulse: 90  Temp: 35.5 C  Resp: 12    Post vital signs: stable  Level of consciousness: Patient remains intubated per anesthesia plan  Complications: No apparent anesthesia complications

## 2012-01-18 NOTE — Progress Notes (Signed)
Patient ID: Lucas Morales, male   DOB: 1933/07/22, 78 y.o.   MRN: [id number]   SICU Evening Rounds:  Hemodynamically stable  Extubated  Urine output good  CT output low  CBC    Component Value Date/Time   WBC 6.0 01/18/2012 1411   RBC 3.41* 01/18/2012 1411   HGB 10.2* 01/18/2012 1411   HCT 29.5* 01/18/2012 1411   PLT 94* 01/18/2012 1411   MCV 86.5 01/18/2012 1411   MCH 29.9 01/18/2012 1411   MCHC 34.6 01/18/2012 1411   RDW 16.6* 01/18/2012 1411   LYMPHSABS 2.5 04/11/2010 1358   MONOABS 0.5 04/11/2010 1358   EOSABS 0.1 04/11/2010 1358   BASOSABS 0.0 04/11/2010 1358    BMET    Component Value Date/Time   NA 144 01/18/2012 1410   K 3.4* 01/18/2012 1410   CL 102 01/12/2012 1209   CO2 24 01/12/2012 1209   GLUCOSE 125* 01/18/2012 1410   BUN 17 01/12/2012 1209   CREATININE 1.05 01/12/2012 1209   CALCIUM 9.3 01/12/2012 1209   GFRNONAA 67* 01/12/2012 1209   GFRAA 78* 01/12/2012 1209    6 hr labs pending  A/P:  Stable, continue present course.

## 2012-01-18 NOTE — OR Nursing (Signed)
2nd call to SICU 

## 2012-01-18 NOTE — Anesthesia Procedure Notes (Signed)
Procedures Procedures: Right IJ Theone Murdoch Catheter Insertion:[id number]: The patient was identified and consent obtained.  TO was performed, and full barrier precautions were used.  The skin was anesthetized with lidocaine-4cc plain with 25g needle.  Once the vein was located with the 22 ga. needle using ultrasound guidance , the wire was inserted into the vein.  The wire location was confirmed with ultrasound.  The tissue was dilated and the 8.5 [place] cordis catheter was carefully inserted. Afterwards Theone Murdoch catheter was inserted. PA catheter at 48cm.  The patient tolerated the procedure well.   CE

## 2012-01-18 NOTE — Anesthesia Preprocedure Evaluation (Signed)
Anesthesia Evaluation  Patient identified by MRN, date of birth, ID band Patient awake    Reviewed: Allergy & Precautions, H&P , NPO status , Patient's Chart, lab work & pertinent test results  History of Anesthesia Complications Negative for: history of anesthetic complications  Airway Mallampati: II TM Distance: >3 FB Neck ROM: Full    Dental  (+) Teeth Intact and Dental Advisory Given   Pulmonary neg pulmonary ROS, shortness of breath and with exertion,    Pulmonary exam normal       Cardiovascular hypertension, Pt. on medications + angina with exertion + CAD + dysrhythmias     Neuro/Psych PSYCHIATRIC DISORDERS Depression negative neurological ROS     GI/Hepatic Neg liver ROS, hiatal hernia, GERD-  Medicated,  Endo/Other  negative endocrine ROSdiabetes  Renal/GU negative Renal ROS     Musculoskeletal   Abdominal   Peds  Hematology   Anesthesia Other Findings   Reproductive/Obstetrics                           Anesthesia Physical Anesthesia Plan  ASA: IV  Anesthesia Plan: General   Post-op Pain Management:    Induction: Intravenous  Airway Management Planned: Oral ETT  Additional Equipment: Arterial line and PA Cath  Intra-op Plan:   Post-operative Plan: Post-operative intubation/ventilation  Informed Consent: I have reviewed the patients History and Physical, chart, labs and discussed the procedure including the risks, benefits and alternatives for the proposed anesthesia with the patient or authorized representative who has indicated his/her understanding and acceptance.   Dental advisory given  Plan Discussed with: CRNA, Anesthesiologist and Surgeon  Anesthesia Plan Comments:         Anesthesia Quick Evaluation

## 2012-01-18 NOTE — Progress Notes (Signed)
The patient was examined and preop studies reviewed. There has been no change from the prior exam and the patient is ready for surgery.  Plan CABG this am

## 2012-01-19 ENCOUNTER — Inpatient Hospital Stay (HOSPITAL_COMMUNITY): Payer: Medicare Other

## 2012-01-19 ENCOUNTER — Encounter (HOSPITAL_COMMUNITY): Payer: Self-pay | Admitting: Cardiothoracic Surgery

## 2012-01-19 LAB — POCT I-STAT, CHEM 8
BUN: 19 mg/dL (ref 6–23)
Calcium, Ion: 1.21 mmol/L (ref 1.13–1.30)
Chloride: 103 mEq/L (ref 96–112)
Creatinine, Ser: 1.1 mg/dL (ref 0.50–1.35)
Glucose, Bld: 105 mg/dL — ABNORMAL HIGH (ref 70–99)
HCT: 26 % — ABNORMAL LOW (ref 39.0–52.0)
Hemoglobin: 8.8 g/dL — ABNORMAL LOW (ref 13.0–17.0)
Potassium: 3.9 mEq/L (ref 3.5–5.1)
Sodium: 140 mEq/L (ref 135–145)
TCO2: 23 mmol/L (ref 0–100)

## 2012-01-19 LAB — TYPE AND SCREEN
ABO/RH(D): A POS
Antibody Screen: NEGATIVE

## 2012-01-19 LAB — BASIC METABOLIC PANEL
CO2: 23 mEq/L (ref 19–32)
Calcium: 8.4 mg/dL (ref 8.4–10.5)
Creatinine, Ser: 0.99 mg/dL (ref 0.50–1.35)
Glucose, Bld: 106 mg/dL — ABNORMAL HIGH (ref 70–99)
Potassium: 4.2 mEq/L (ref 3.5–5.1)

## 2012-01-19 LAB — PREPARE PLATELET PHERESIS
Unit division: 0
Unit division: 0

## 2012-01-19 LAB — GLUCOSE, CAPILLARY
Glucose-Capillary: 106 mg/dL — ABNORMAL HIGH (ref 70–99)
Glucose-Capillary: 107 mg/dL — ABNORMAL HIGH (ref 70–99)
Glucose-Capillary: 110 mg/dL — ABNORMAL HIGH (ref 70–99)
Glucose-Capillary: 110 mg/dL — ABNORMAL HIGH (ref 70–99)
Glucose-Capillary: 131 mg/dL — ABNORMAL HIGH (ref 70–99)
Glucose-Capillary: 135 mg/dL — ABNORMAL HIGH (ref 70–99)
Glucose-Capillary: 142 mg/dL — ABNORMAL HIGH (ref 70–99)
Glucose-Capillary: 144 mg/dL — ABNORMAL HIGH (ref 70–99)
Glucose-Capillary: 147 mg/dL — ABNORMAL HIGH (ref 70–99)
Glucose-Capillary: 149 mg/dL — ABNORMAL HIGH (ref 70–99)
Glucose-Capillary: 151 mg/dL — ABNORMAL HIGH (ref 70–99)
Glucose-Capillary: 73 mg/dL (ref 70–99)
Glucose-Capillary: 96 mg/dL (ref 70–99)

## 2012-01-19 LAB — CBC
HCT: 28 % — ABNORMAL LOW (ref 39.0–52.0)
HCT: 28.2 % — ABNORMAL LOW (ref 39.0–52.0)
MCHC: 34.3 g/dL (ref 30.0–36.0)
MCHC: 33 g/dL (ref 30.0–36.0)
MCV: 87.6 fL (ref 78.0–100.0)
Platelets: 122 10*3/uL — ABNORMAL LOW (ref 150–400)
Platelets: 127 10*3/uL — ABNORMAL LOW (ref 150–400)
RDW: 17.3 % — ABNORMAL HIGH (ref 11.5–15.5)
RDW: 17.6 % — ABNORMAL HIGH (ref 11.5–15.5)
WBC: 7.9 10*3/uL (ref 4.0–10.5)
WBC: 8 10*3/uL (ref 4.0–10.5)

## 2012-01-19 LAB — PREPARE FRESH FROZEN PLASMA: Unit division: 0

## 2012-01-19 LAB — CREATININE, SERUM: GFR calc Af Amer: 81 mL/min — ABNORMAL LOW (ref >90)

## 2012-01-19 MED ORDER — PROMETHAZINE HCL 25 MG/ML IJ SOLN: 12.5000 mg | Freq: Four times a day (QID) | INTRAMUSCULAR | Status: DC | PRN

## 2012-01-19 MED ORDER — INSULIN ASPART 100 UNIT/ML ~~LOC~~ SOLN
0.0000 [IU] | SUBCUTANEOUS | Status: DC
  Administered 2012-01-19 (×4): 2 [IU] via SUBCUTANEOUS
  Administered 2012-01-20: 08:00:00 via SUBCUTANEOUS
  Administered 2012-01-20: 2 [IU] via SUBCUTANEOUS

## 2012-01-19 MED ORDER — POTASSIUM CHLORIDE 10 MEQ/50ML IV SOLN
INTRAVENOUS | Status: AC
  Administered 2012-01-19: 10 meq
  Filled 2012-01-19: qty 50

## 2012-01-19 MED ORDER — VANCOMYCIN HCL IN DEXTROSE 1-5 GM/200ML-% IV SOLN
1000.0000 mg | Freq: Once | INTRAVENOUS | Status: AC
  Administered 2012-01-19: 1000 mg via INTRAVENOUS
  Filled 2012-01-19: qty 200

## 2012-01-19 MED ORDER — FUROSEMIDE 10 MG/ML IJ SOLN
20.0000 mg | Freq: Two times a day (BID) | INTRAMUSCULAR | Status: AC
  Administered 2012-01-19 – 2012-01-20 (×3): 20 mg via INTRAVENOUS

## 2012-01-19 MED ORDER — ATORVASTATIN CALCIUM 40 MG PO TABS
40.0000 mg | ORAL_TABLET | Freq: Every day | ORAL | Status: DC
  Administered 2012-01-19 – 2012-01-26 (×8): 40 mg via ORAL
  Filled 2012-01-19 (×9): qty 1

## 2012-01-19 MED ORDER — INSULIN GLARGINE 100 UNIT/ML ~~LOC~~ SOLN
20.0000 [IU] | Freq: Two times a day (BID) | SUBCUTANEOUS | Status: DC
  Administered 2012-01-19 – 2012-01-21 (×6): 20 [IU] via SUBCUTANEOUS

## 2012-01-19 MED ORDER — METOCLOPRAMIDE HCL 5 MG/ML IJ SOLN
10.0000 mg | Freq: Four times a day (QID) | INTRAMUSCULAR | Status: AC
  Administered 2012-01-19 – 2012-01-20 (×4): 10 mg via INTRAVENOUS
  Filled 2012-01-19 (×4): qty 2

## 2012-01-19 MED ORDER — TRAMADOL HCL 50 MG PO TABS
100.0000 mg | ORAL_TABLET | Freq: Four times a day (QID) | ORAL | Status: DC | PRN
  Administered 2012-01-19 – 2012-01-20 (×2): 100 mg via ORAL
  Filled 2012-01-19 (×2): qty 2

## 2012-01-19 MED ORDER — HYDROMORPHONE HCL PF 1 MG/ML IJ SOLN: 1.0000 mg | INTRAMUSCULAR | Status: DC | PRN

## 2012-01-19 MED ORDER — POTASSIUM CHLORIDE 10 MEQ/50ML IV SOLN: 10.0000 meq | Freq: Once | INTRAVENOUS | Status: AC

## 2012-01-19 MED FILL — Cefuroxime Sodium For Inj 750 MG: INTRAMUSCULAR | Qty: 750 | Status: AC

## 2012-01-19 MED FILL — Potassium Chloride Inj 2 mEq/ML: INTRAVENOUS | Qty: 40 | Status: AC

## 2012-01-19 MED FILL — Magnesium Sulfate Inj 50%: INTRAMUSCULAR | Qty: 10 | Status: AC

## 2012-01-19 MED FILL — Vancomycin HCl For IV Soln 1 GM (Base Equivalent): INTRAVENOUS | Qty: 1500 | Status: AC

## 2012-01-19 NOTE — Progress Notes (Signed)
Patient ID: Lucas Morales, male   DOB: 04-28-1933, 78 y.o.   MRN: [id number]                   [address]            [place] [address]          [phone]     1 Day Post-Op Procedure(s) (LRB): CORONARY ARTERY BYPASS GRAFTING (CABG) (N/A) ENDOVEIN HARVEST OF GREATER SAPHENOUS VEIN (Right)  Total Length of Stay:  LOS: 1 day  BP 104/54  Pulse 80  Temp 98.2 F (36.8 C) (Oral)  Resp 18  Ht 6\' 3"  (1.905 m)  Wt 255 lb 1.2 oz (115.7 kg)  BMI 31.88 kg/m2  SpO2 93%  .Intake/Output      10/31 0701 - 11/01 0700 11/01 0701 - 11/02 0700   I.V. (mL/kg) 2997.5 (25.9) 182.2 (1.6)   Blood 1090    IV Piggyback 1152 254   Total Intake(mL/kg) 5239.5 (45.3) 436.2 (3.8)   Urine (mL/kg/hr) 3300 (1.2) 810 (0.6)   Blood 1600    Chest Tube 620 160   Total Output 5520 970   Net -280.5 -533.8             . sodium chloride 20 mL/hr at 01/18/12 1500  . sodium chloride    . insulin (NOVOLIN-R) infusion Stopped (01/19/12 1100)  . phenylephrine (NEO-SYNEPHRINE) Adult infusion    . DISCONTD: sodium chloride    . DISCONTD: dexmedetomidine Stopped (01/18/12 1800)  . DISCONTD: lactated ringers 20 mL/hr at 01/18/12 1800  . DISCONTD: nitroGLYCERIN Stopped (01/18/12 1700)     Lab Results  Component Value Date   WBC 8.0 01/19/2012   HGB 8.8* 01/19/2012   HCT 26.0* 01/19/2012   PLT 127* 01/19/2012   GLUCOSE 105* 01/19/2012   ALT 26 01/12/2012   AST 24 01/12/2012   NA 140 01/19/2012   K 3.9 01/19/2012   CL 103 01/19/2012   CREATININE 1.10 01/19/2012   BUN 19 01/19/2012   CO2 23 01/19/2012   TSH 1.120 01/12/2012   INR 1.39 01/18/2012   HGBA1C 6.2* 01/12/2012   Stable day Up walking Stable day  Delight Ovens MD  Beeper [phone] Office [phone] 01/19/2012 6:10 PM

## 2012-01-19 NOTE — Progress Notes (Signed)
1 Day Post-Op Procedure(s) (LRB): CORONARY ARTERY BYPASS GRAFTING (CABG) (N/A) ENDOVEIN HARVEST OF GREATER SAPHENOUS VEIN (Right) Subjective CABGx4 for 3V CAD Borderline DM, hx GERD w/ esophogeal dysmotility Sinus brady req atrial  pacing  Objective: Vital signs in last 24 hours: Temp:  [95.9 F (35.5 C)-99.3 F (37.4 C)] 99.1 F (37.3 C) (11/01 0700) Pulse Rate:  [70-90] 70  (11/01 0700) Cardiac Rhythm:  [-] Atrial paced (11/01 0600) Resp:  [12-21] 18  (11/01 0700) BP: (97-118)/(57-74) 106/59 mmHg (11/01 0500) SpO2:  [92 %-100 %] 92 % (11/01 0700) Arterial Line BP: (91-155)/(38-67) 108/38 mmHg (11/01 0700) FiO2 (%):  [40 %-50 %] 40 % (10/31 1600) Weight:  [255 lb 1.2 oz (115.7 kg)] 255 lb 1.2 oz (115.7 kg) (11/01 0500)  Hemodynamic parameters for last 24 hours: PAP: (17-35)/(3-17) 17/3 mmHg CO:  [5.6 L/min-8.4 L/min] 6.1 L/min CI:  [2.3 L/min/m2-3.5 L/min/m2] 2.6 L/min/m2  Intake/Output from previous day: 10/31 0701 - 11/01 0700 In: 5239.5 [I.V.:2997.5; Blood:1090; IV Piggyback:1152] Out: 5520 [Urine:3300; Blood:1600; Chest Tube:620] Intake/Output this shift:    EXAM Neuro intact Lungs clear extrem warm abd soft  Lab Results:  Basename 01/19/12 0430 01/18/12 2101  WBC 7.9 8.3  HGB 9.6* 9.6*  HCT 28.0* 28.5*  PLT 122* 119*   BMET:  Basename 01/19/12 0430 01/18/12 2101 01/18/12 2056  NA 140 -- 143  K 4.2 -- 4.5  CL 109 -- 108  CO2 23 -- --  GLUCOSE 106* -- 124*  BUN 17 -- 18  CREATININE 0.99 0.97 --  CALCIUM 8.4 -- --    PT/INR:  Basename 01/18/12 1411  LABPROT 16.7*  INR 1.39   ABG    Component Value Date/Time   PHART 7.359 01/18/2012 2100   HCO3 21.6 01/18/2012 2100   TCO2 23 01/18/2012 2100   ACIDBASEDEF 3.0* 01/18/2012 2100   O2SAT 94.0 01/18/2012 2100   CBG (last 3)   Basename 01/19/12 0752 01/19/12 0620 01/19/12 0412  GLUCAP 106* 96 107*    Assessment/Plan: S/P Procedure(s) (LRB): CORONARY ARTERY BYPASS GRAFTING (CABG)  (N/A) ENDOVEIN HARVEST OF GREATER SAPHENOUS VEIN (Right) See progression orders lantus to transition off insulin drip Hold lopressor for sinus brady   LOS: 1 day    VAN TRIGT III,PETER 01/19/2012

## 2012-01-19 NOTE — Op Note (Signed)
NAME:  Lucas Morales, Lucas Morales NO.:  [id number]  MEDICAL RECORD NO.:  [id number]  LOCATION:                                 FACILITY:  PHYSICIAN:  Kerin Perna, M.D.  DATE OF BIRTH:  09/01/1933  DATE OF PROCEDURE:  01/18/2012 DATE OF DISCHARGE:                              OPERATIVE REPORT   PREOPERATIVE DIAGNOSIS:  Severe multivessel coronary artery disease with class 3 progressive angina.  POSTOPERATIVE DIAGNOSIS:  Severe multivessel coronary artery disease with class 3 progressive angina.  OPERATION: 1. Coronary artery bypass grafting x4 (left internal mammary artery to     left anterior descending, saphenous vein graft to distal posterior     descending, sequential saphenous vein graft to obtuse marginal 3     and obtuse marginal 4. 2. Endoscopic harvest of right leg greater saphenous vein.  SURGEON:  Kerin Perna, MD  ASSISTANT:  Coral Ceo, PA-C  ANESTHESIA:  General by Dr. [name]. Krista Blue, M.D.  INDICATIONS:  The patient is a 78 year old Caucasian male with progressing exertional dyspnea and chest discomfort.  He denied any resting symptoms.  Cardiac catheterization was performed after a positive stress test.  Cardiac catheterization demonstrated high-grade three-vessel coronary artery disease with fairly well-preserved LV function.  A 2D echo showed no significant valvular disease.  Surgical revascularization was recommended to the patient.  I discussed the indications, benefits, alternatives, and risks with the patient and wife regarding treatment of his 3 vessel coronary artery disease with surgical coronary revascularization.  We discussed the details including location of the surgical incisions, use of general anesthesia and cardiopulmonary bypass, and the expected postoperative hospital recovery.  I reviewed with the patient the risks to him of coronary artery bypass surgery including the risks of stroke, MI, bleeding, infection, and  death.  He understood these issues and agreed to proceed with surgery under what I thought was an informed consent.  FINDINGS: 1. Preserved LV function without areas of scarring. 2. Intraoperative coagulopathy with low platelet count treated with     platelet count transfusion x1 and 1 dose of DDAVP. 3. Good quality conduit. 4. Small targets especially in the distal right and distal circumflex     targets.  PROCEDURAL NOTE:  The patient was brought directly to the operating room and placed in supine on the operation room table.  General anesthesia was induced under invasive hemodynamic monitoring.  The chest, abdomen, and legs were prepped with Betadine and draped as a sterile field.  A sternal incision was made as the saphenous vein was harvested endoscopically from the right leg.  The left internal mammary artery was harvested as a pedicle graft from its origin at the subclavian vessels. It was an adequate conduit with good flow.  The sternal retractor was then placed and pericardium opened and suspended.  Pursestrings were placed in the ascending aorta, right atrium and the patient was heparinized and ACT was documented as being therapeutic.  The patient was then cannulated and placed on cardiopulmonary bypass after the vein had been harvested and inspected.  The coronary arteries were identified for grafting.  The mammary artery and vein grafts were prepared for the distal anastomoses.  Cardioplegic cannula was replaced for both antegrade and retrograde cold blood cardioplegia.  The patient was cooled to 32 degrees.  The aortic crossclamp was applied.  One liter of cold blood cardioplegia was delivered in split doses between the antegrade aortic and retrograde coronary sinus catheters.  There was good cardioplegic arrest and septal temperature dropped less than 14 degrees.  Cardioplegia was delivered every 20 minutes while the crossclamp was in place.  The distal coronary  anastomoses were then performed.  The first distal anastomosis was to the distal posterior descending.  This was fairly distal with the vessels 1.4 mm.  There was proximal 90% stenosis.  A reverse saphenous vein was sewn end-to-side with running 7-0 Prolene with good flow through graft.  The 2nd and 3rd distal anastomoses consisted of a sequential vein graft to the OM-3 and OM-4.  The OM-3 was a larger vessel 1.5 mm with proximal 90% stenosis.  The vein was sewn side-to-side with running 7-0 Prolene with good flow through the graft. The 3rd graft was the continuation of the sequential vein graft to the OM-4.  This was a smaller 1.2-mm vessel proximal 80% stenosis and the end of vein was sewn end-to-side with running 7-0 Prolene.  Cardioplegia was redosed through the sequential vein graft in the posterior descending vein graft and there was good flow through the grafts.  The 4th distal anastomosis was the distal LAD.  The LAD bifurcated at the distal 3rd.  It was 1.5-mm vessel.  The left IMA pedicle was brought through an opening in the left lateral pericardium, was brought down onto the LAD and sewn end-to-side with running 8-0 Prolene.  There was good flow through the anastomosis after briefly releasing the pedicle bulldog on the mammary artery.  The bulldog was reapplied and the pedicle secured the epicardium with 6-0 Prolene.  Cardioplegia was redosed.  While the crossclamp was still in place, two proximal vein anastomoses were performed on the ascending aorta with a 4.5-mm punch running 7-0 Prolene.  Air was vented from the coronaries with a dose of retrograde warm blood cardioplegia and the crossclamp was removed as the final proximal anastomosis was tied.  The heart resumed a spontaneous rhythm.  The bypass grafts were opened, each had good flow.  Hemostasis was documented to the proximal and distal anastomoses.  The patient was rewarmed to 37 degrees.  Temporary pacing wires  were applied.  The lungs were expanded and ventilator was resumed.  The patient was then weaned off bypass without difficulty. Cardiac output and blood pressure were normal.  Protamine was administered without adverse reaction.  The cannula was removed.  There was diffuse coagulopathy.  The platelet count was low.  The patient was given unit of platelets with improved coagulation function.  The superior pericardial fat was closed over the aorta. One mediastinal and a left pleural chest tube were placed and brought through separate incisions.  The sternum was closed with wire.  The pectoralis fascia was closed in running #1 Vicryl.  The subcutaneous and skin layers were closed in running Vicryl and sterile dressings were applied.  Total cardiopulmonary bypass time was 115 minutes.     Kerin Perna, M.D.     PV/MEDQ  D:  01/18/2012  T:  01/19/2012  Job:  [id number]  cc:   Landry Corporal, MD

## 2012-01-19 NOTE — Progress Notes (Signed)
78 y/o man -- patient of Dr. Allyson Sabal --> recent cath for abnormal Myoview to evaluate crescendo angina.  MV Dz - stable for scheduled CABG 10/31.   Day #1 Post-Op:   CORONARY ARTERY BYPASS GRAFTING (CABG) (N/A)   ENDOVEIN HARVEST OF GREATER SAPHENOUS VEIN (Right)   Subjective:  Up in room  Objective:  Vital Signs in the last 24 hours: Temp:  [95.9 F (35.5 C)-99.3 F (37.4 C)] 98.6 F (37 C) (11/01 0800) Pulse Rate:  [70-90] 70  (11/01 0800) Resp:  [12-21] 18  (11/01 0800) BP: (97-118)/(51-74) 104/51 mmHg (11/01 0800) SpO2:  [92 %-100 %] 95 % (11/01 0800) Arterial Line BP: (91-155)/(38-67) 101/39 mmHg (11/01 0800) FiO2 (%):  [40 %-50 %] 40 % (10/31 1600) Weight:  [115.7 kg (255 lb 1.2 oz)] 115.7 kg (255 lb 1.2 oz) (11/01 0500)  Intake/Output from previous day:  Intake/Output Summary (Last 24 hours) at 01/19/12 0901 Last data filed at 01/19/12 0800  Gross per 24 hour  Intake 5281.67 ml  Output   5635 ml  Net -353.33 ml    Physical Exam: General appearance: alert, cooperative and no distress Lungs: decreased breath sounds Heart: regular rate and rhythm   Rate: A paced @ 70  Rhythm: normal sinus rhythm - A pacing with baseline S Brady  Lab Results:  Basename 01/19/12 0430 01/18/12 2101  WBC 7.9 8.3  HGB 9.6* 9.6*  PLT 122* 119*    Basename 01/19/12 0430 01/18/12 2101 01/18/12 2056  NA 140 -- 143  K 4.2 -- 4.5  CL 109 -- 108  CO2 23 -- --  GLUCOSE 106* -- 124*  BUN 17 -- 18  CREATININE 0.99 0.97 --   Imaging: Imaging results have been reviewed; CXR stable with ETT removed.  EF normal by OP Echo.  Assessment/Plan:   Principal Problem:  *S/P CABG x 4, elective, 01/18/12 Active Problems:  CAD -- mLAD 80&-60%, OM2 90%, OM3 70-80%, RPDA 90%, cath 01/11/12  Hypertension  Hyperlipidemia  Plan- Will follow.  Corine Shelter PA-C 01/19/2012, 9:01 AM  I have seen & examined the patient this AM (my exam above) along with Mr. Diona Fanti.  I agree with his findings &  impression.  Seems to be doing well Day#1.  CT, Swan & A line to be d/c'd today.  Foley to stay in -- has h/o interstitial cystitis. ECG this AM with S brady @ 56bpm -- A pacing used currently. BP is "soft" & with bradycardia, BB held. - was on ARB at home that is also being held. Not on statin at home -- will initiate atorvastatin.  Marykay Lex, M.D., M.S. [facility] [address] [place], [place]  [address]  [phone] Pager # [phone] 01/19/2012 9:17 AM

## 2012-01-20 ENCOUNTER — Inpatient Hospital Stay (HOSPITAL_COMMUNITY): Payer: Medicare Other

## 2012-01-20 LAB — CBC
HCT: 27.1 % — ABNORMAL LOW (ref 39.0–52.0)
Hemoglobin: 9 g/dL — ABNORMAL LOW (ref 13.0–17.0)
MCH: 29 pg (ref 26.0–34.0)
MCHC: 33.2 g/dL (ref 30.0–36.0)
MCV: 87.4 fL (ref 78.0–100.0)
Platelets: 122 10*3/uL — ABNORMAL LOW (ref 150–400)
RBC: 3.1 MIL/uL — ABNORMAL LOW (ref 4.22–5.81)
RDW: 17.6 % — ABNORMAL HIGH (ref 11.5–15.5)
WBC: 6.7 10*3/uL (ref 4.0–10.5)

## 2012-01-20 LAB — GLUCOSE, CAPILLARY
Glucose-Capillary: 105 mg/dL — ABNORMAL HIGH (ref 70–99)
Glucose-Capillary: 143 mg/dL — ABNORMAL HIGH (ref 70–99)
Glucose-Capillary: 130 mg/dL — ABNORMAL HIGH (ref 70–99)
Glucose-Capillary: 120 mg/dL — ABNORMAL HIGH (ref 70–99)
Glucose-Capillary: 139 mg/dL — ABNORMAL HIGH (ref 70–99)
Glucose-Capillary: 138 mg/dL — ABNORMAL HIGH (ref 70–99)
Glucose-Capillary: 141 mg/dL — ABNORMAL HIGH (ref 70–99)
Glucose-Capillary: 134 mg/dL — ABNORMAL HIGH (ref 70–99)

## 2012-01-20 LAB — BASIC METABOLIC PANEL
BUN: 20 mg/dL (ref 6–23)
CO2: 27 mEq/L (ref 19–32)
Calcium: 8.4 mg/dL (ref 8.4–10.5)
Chloride: 105 mEq/L (ref 96–112)
Creatinine, Ser: 1.03 mg/dL (ref 0.50–1.35)
GFR calc Af Amer: 79 mL/min — ABNORMAL LOW (ref >90)
GFR calc non Af Amer: 68 mL/min — ABNORMAL LOW (ref >90)
Glucose, Bld: 125 mg/dL — ABNORMAL HIGH (ref 70–99)
Potassium: 3.8 mEq/L (ref 3.5–5.1)
Sodium: 139 mEq/L (ref 135–145)

## 2012-01-20 MED ORDER — BISACODYL 10 MG RE SUPP: 10.0000 mg | Freq: Every day | RECTAL | Status: DC | PRN

## 2012-01-20 MED ORDER — SODIUM CHLORIDE 0.9 % IJ SOLN
3.0000 mL | Freq: Two times a day (BID) | INTRAMUSCULAR | Status: DC
  Administered 2012-01-22 – 2012-01-26 (×8): 3 mL via INTRAVENOUS

## 2012-01-20 MED ORDER — ONDANSETRON HCL 4 MG/2ML IJ SOLN: 4.0000 mg | Freq: Four times a day (QID) | INTRAMUSCULAR | Status: DC | PRN

## 2012-01-20 MED ORDER — MOVING RIGHT ALONG BOOK
Freq: Once | Status: AC
  Administered 2012-01-20: 10:00:00
  Filled 2012-01-20: qty 1

## 2012-01-20 MED ORDER — INSULIN ASPART 100 UNIT/ML ~~LOC~~ SOLN
0.0000 [IU] | Freq: Three times a day (TID) | SUBCUTANEOUS | Status: DC
  Administered 2012-01-20 – 2012-01-25 (×12): 2 [IU] via SUBCUTANEOUS

## 2012-01-20 MED ORDER — ASPIRIN EC 325 MG PO TBEC
325.0000 mg | DELAYED_RELEASE_TABLET | Freq: Every day | ORAL | Status: DC
  Administered 2012-01-20 – 2012-01-26 (×7): 325 mg via ORAL
  Filled 2012-01-20 (×8): qty 1

## 2012-01-20 MED ORDER — FUROSEMIDE 10 MG/ML IJ SOLN
INTRAMUSCULAR | Status: AC
  Filled 2012-01-20: qty 4

## 2012-01-20 MED ORDER — ENOXAPARIN SODIUM 30 MG/0.3ML ~~LOC~~ SOLN
30.0000 mg | SUBCUTANEOUS | Status: DC
  Administered 2012-01-20 – 2012-01-21 (×2): 30 mg via SUBCUTANEOUS
  Filled 2012-01-20 (×3): qty 0.3

## 2012-01-20 MED ORDER — CALCIUM CARBONATE ANTACID 750 MG PO CHEW
2.0000 | CHEWABLE_TABLET | Freq: Every day | ORAL | Status: DC
  Filled 2012-01-20 (×2): qty 2

## 2012-01-20 MED ORDER — BISACODYL 5 MG PO TBEC
10.0000 mg | DELAYED_RELEASE_TABLET | Freq: Every day | ORAL | Status: DC | PRN
  Administered 2012-01-20: 10 mg via ORAL

## 2012-01-20 MED ORDER — DOCUSATE SODIUM 100 MG PO CAPS
200.0000 mg | ORAL_CAPSULE | Freq: Every day | ORAL | Status: DC
  Administered 2012-01-20 – 2012-01-24 (×4): 200 mg via ORAL
  Filled 2012-01-20 (×7): qty 2

## 2012-01-20 MED ORDER — MAGNESIUM HYDROXIDE 400 MG/5ML PO SUSP: 30.0000 mL | Freq: Every day | ORAL | Status: DC | PRN

## 2012-01-20 MED ORDER — MULTI-VITAMIN/MINERALS PO TABS
1.0000 | ORAL_TABLET | Freq: Every day | ORAL | Status: DC
  Administered 2012-01-20 – 2012-01-26 (×6): 1 via ORAL
  Filled 2012-01-20 (×10): qty 1

## 2012-01-20 MED ORDER — DESMOPRESSIN ACETATE 0.2 MG PO TABS
0.2000 mg | ORAL_TABLET | Freq: Every day | ORAL | Status: DC
  Filled 2012-01-20 (×6): qty 1

## 2012-01-20 MED ORDER — OXYCODONE HCL 5 MG PO TABS
5.0000 mg | ORAL_TABLET | ORAL | Status: DC | PRN
  Administered 2012-01-20 – 2012-01-21 (×5): 10 mg via ORAL
  Administered 2012-01-22: 5 mg via ORAL
  Administered 2012-01-22 – 2012-01-27 (×15): 10 mg via ORAL
  Filled 2012-01-20 (×21): qty 2

## 2012-01-20 MED ORDER — PANTOPRAZOLE SODIUM 40 MG PO TBEC
40.0000 mg | DELAYED_RELEASE_TABLET | Freq: Every day | ORAL | Status: DC
  Administered 2012-01-20 – 2012-01-26 (×7): 40 mg via ORAL
  Filled 2012-01-20 (×6): qty 1

## 2012-01-20 MED ORDER — SODIUM CHLORIDE 0.9 % IJ SOLN: 3.0000 mL | INTRAMUSCULAR | Status: DC | PRN

## 2012-01-20 MED ORDER — ONDANSETRON HCL 4 MG PO TABS: 4.0000 mg | ORAL_TABLET | Freq: Four times a day (QID) | ORAL | Status: DC | PRN

## 2012-01-20 MED ORDER — NORTRIPTYLINE HCL 10 MG PO CAPS
10.0000 mg | ORAL_CAPSULE | Freq: Every day | ORAL | Status: DC
  Administered 2012-01-20 – 2012-01-26 (×7): 10 mg via ORAL
  Filled 2012-01-20 (×8): qty 1

## 2012-01-20 MED ORDER — SODIUM CHLORIDE 0.9 % IV SOLN: 250.0000 mL | INTRAVENOUS | Status: DC | PRN

## 2012-01-20 MED ORDER — TRAMADOL HCL 50 MG PO TABS: 50.0000 mg | ORAL_TABLET | ORAL | Status: DC | PRN

## 2012-01-20 MED ORDER — CALCIUM CARBONATE ANTACID 500 MG PO CHEW
3.0000 | CHEWABLE_TABLET | Freq: Every day | ORAL | Status: DC
  Administered 2012-01-20 – 2012-01-26 (×7): 600 mg via ORAL
  Filled 2012-01-20 (×8): qty 3

## 2012-01-20 NOTE — Progress Notes (Signed)
Pt tx to 2015 after report called. Ambulated distance of tx, tolerated well. rcv'd by RN and NT, tele verified.

## 2012-01-20 NOTE — Progress Notes (Signed)
78 y/o man -- patient of Dr. Allyson Sabal --> recent cath for abnormal Myoview to evaluate crescendo angina.  MV Dz - stable for scheduled CABG 10/31.   Day #2 Post-Op:   CORONARY ARTERY BYPASS GRAFTING (CABG) (N/A)   ENDOVEIN HARVEST OF GREATER SAPHENOUS VEIN (Right)   Subjective:  No complaints. Up in chair -- woke up a bit breathless, but better sitting up  Objective:  Vital Signs in the last 24 hours: Temp:  [97.7 F (36.5 C)-99.1 F (37.3 C)] 98.5 F (36.9 C) (11/02 0400) Pulse Rate:  [61-83] 80  (11/02 0600) Resp:  [9-23] 16  (11/02 0600) BP: (89-117)/(44-69) 110/64 mmHg (11/02 0600) SpO2:  [89 %-98 %] 95 % (11/02 0600) Arterial Line BP: (101-127)/(38-57) 127/57 mmHg (11/01 0900)  Intake/Output from previous day:  Intake/Output Summary (Last 24 hours) at 01/20/12 0637 Last data filed at 01/20/12 0600  Gross per 24 hour  Intake 814.79 ml  Output   1320 ml  Net -505.21 ml    Physical Exam: General appearance: alert, cooperative and no distress Lungs: decreased breath sounds in bases, but non-laboted Heart: regular rate and rhythm, + 2 part friction rub Abd: soft, NT. ND, NABS Extr: trace edema, no C/C    Rate:80  Rhythm: A paced  Lab Results:  Basename 01/20/12 0414 01/19/12 1711 01/19/12 1700  WBC 6.7 -- 8.0  HGB 9.0* 8.8* --  PLT 122* -- 127*    Basename 01/20/12 0414 01/19/12 1711 01/19/12 0430  NA 139 140 --  K 3.8 3.9 --  CL 105 103 --  CO2 27 -- 23  GLUCOSE 125* 105* --  BUN 20 19 --  CREATININE 1.03 1.10 --   Imaging: CXR pending  EF normal by OP Echo.  Assessment/Plan:   Principal Problem:  *S/P CABG x 4, elective, 01/18/12 Active Problems:  CAD -- mLAD 80&-60%, OM2 90%, OM3 70-80%, RPDA 90%, cath 01/11/12  Hypertension  Hyperlipidemia  Seems to be doing well Day#2.  Still has Mediastinal Tube in, may come out today  Foley to stay in -- has h/o interstitial cystitis.  ECG this AM not done -- A pacing used currently.  May want to see  what his Native Sinus rate will do.  Would monitor closely his HR response to exercise to determine chronotropic competence.  BP is "soft" & with bradycardia, low dose BB ordered but held. - was on ARB at home that is also being held.  Gentle diuresis  Not on statin at home -- started atorvastatin.  Marykay Lex, M.D., M.S. [facility] [address] [place], [place]  [address]  [phone] Pager # [phone] 01/20/2012 6:37 AM

## 2012-01-20 NOTE — Progress Notes (Signed)
Patient ID: Lucas Morales, male   DOB: 01/09/1934, 78 y.o.   MRN: [id number] TCTS DAILY PROGRESS NOTE                   [address]            [place] [address]          [phone]      2 Days Post-Op Procedure(s) (LRB): CORONARY ARTERY BYPASS GRAFTING (CABG) (N/A) ENDOVEIN HARVEST OF GREATER SAPHENOUS VEIN (Right)  Total Length of Stay:  LOS: 2 days   Subjective: Complaint of noise and ringing phones in icu  Objective: Vital signs in last 24 hours: Temp:  [97.7 F (36.5 C)-98.9 F (37.2 C)] 98.9 F (37.2 C) (11/02 0755) Pulse Rate:  [61-83] 80  (11/02 0700) Cardiac Rhythm:  [-] Atrial paced (11/02 0600) Resp:  [9-23] 13  (11/02 0700) BP: (88-117)/(44-69) 88/68 mmHg (11/02 0700) SpO2:  [89 %-96 %] 96 % (11/02 0700) Weight:  [255 lb 4.7 oz (115.8 kg)] 255 lb 4.7 oz (115.8 kg) (11/02 0500)  Filed Weights   01/17/12 1500 01/19/12 0500 01/20/12 0500  Weight: 247 lb 9.2 oz (112.3 kg) 255 lb 1.2 oz (115.7 kg) 255 lb 4.7 oz (115.8 kg)    Weight change: 3.5 oz (0.1 kg)   Hemodynamic parameters for last 24 hours:    Intake/Output from previous day: 11/01 0701 - 11/02 0700 In: 794.2 [I.V.:482.2; IV Piggyback:312] Out: 1845 [Urine:1585; Chest Tube:260]  Intake/Output this shift:    Current Meds: Scheduled Meds:   . acetaminophen  1,000 mg Oral Q6H   Or  . acetaminophen (TYLENOL) oral liquid 160 mg/5 mL  975 mg Per Tube Q6H  . allopurinol  300 mg Oral Daily  . aspirin EC  325 mg Oral Daily   Or  . aspirin  324 mg Per Tube Daily  . atorvastatin  40 mg Oral q1800  . bisacodyl  10 mg Oral Daily   Or  . bisacodyl  10 mg Rectal Daily  . cefUROXime (ZINACEF)  IV  1.5 g Intravenous Q12H  . docusate sodium  200 mg Oral Daily  . famotidine (PEPCID) IV  20 mg Intravenous Q12H  . furosemide      . furosemide  20 mg Intravenous BID  . insulin aspart  0-24 Units Subcutaneous Q4H  . insulin glargine  20 Units Subcutaneous BID  . insulin regular  0-10  Units Intravenous TID WC  . metoCLOPramide (REGLAN) injection  10 mg Intravenous Q6H  . metoprolol tartrate  12.5 mg Oral BID   Or  . metoprolol tartrate  12.5 mg Per Tube BID  . pantoprazole  40 mg Oral Daily  . potassium chloride  10 mEq Intravenous Once  . potassium chloride      . sodium chloride  3 mL Intravenous Q12H  . vancomycin  1,000 mg Intravenous Once   Continuous Infusions:   . sodium chloride 20 mL/hr at 01/18/12 1500  . sodium chloride    . insulin (NOVOLIN-R) infusion Stopped (01/19/12 1100)  . phenylephrine (NEO-SYNEPHRINE) Adult infusion     PRN Meds:.albumin human, HYDROmorphone (DILAUDID) injection, metoprolol, midazolam, ondansetron (ZOFRAN) IV, oxyCODONE, promethazine, sodium chloride, traMADol  General appearance: alert, cooperative and no distress Neurologic: intact Heart: regular rate and rhythm, S1, S2 normal, no murmur, click, rub or gallop and normal apical impulse Lungs: clear to auscultation bilaterally and normal percussion bilaterally Abdomen: soft, non-tender; bowel sounds normal; no masses,  no organomegaly  Extremities: extremities normal, atraumatic, no cyanosis or edema and Homans sign is negative, no sign of DVT Wound: sternum stable  Lab Results: CBC: Basename 01/20/12 0414 01/19/12 1711 01/19/12 1700  WBC 6.7 -- 8.0  HGB 9.0* 8.8* --  HCT 27.1* 26.0* --  PLT 122* -- 127*   BMET:  Basename 01/20/12 0414 01/19/12 1711 01/19/12 0430  NA 139 140 --  K 3.8 3.9 --  CL 105 103 --  CO2 27 -- 23  GLUCOSE 125* 105* --  BUN 20 19 --  CREATININE 1.03 1.10 --  CALCIUM 8.4 -- 8.4    PT/INR:  Basename 01/18/12 1411  LABPROT 16.7*  INR 1.39   Radiology: Dg Chest Portable 1 View In Am  01/20/2012  *RADIOLOGY REPORT*  Clinical Data: Postop from CABG.  Chest tube removal.  PORTABLE CHEST - 1 VIEW  Comparison: 01/19/2012  Findings: Left chest tube and Swan-Ganz catheter have been removed. No evidence of pneumothorax.  Bibasilar atelectasis is  again seen, without significant change. Cardiomegaly stable.  No evidence of pulmonary edema.  No other acute infiltrate identified.  IMPRESSION:  1.  No evidence of pneumothorax following chest tube removal. 2.  Stable cardiomegaly and bibasilar atelectasis.   Original Report Authenticated By: Myles Rosenthal, M.D.    Dg Chest Portable 1 View In Am  01/19/2012  *RADIOLOGY REPORT*  Clinical Data: Post CABG, follow-up  PORTABLE CHEST - 1 VIEW  Comparison: Portable chest x-ray of 01/18/2012  Findings: The endotracheal tube and the NG tube have been removed. There is little change in aeration.  A left chest tube remains and no pneumothorax is seen.  The Swan-Ganz catheter tip is in the main pulmonary artery.  Cardiomegaly is stable.  IMPRESSION: Endotracheal tube and NG tube removed.  Little change in aeration.   Original Report Authenticated By: Dwyane Dee, M.D.    Dg Chest Portable 1 View  01/18/2012  *RADIOLOGY REPORT*  Clinical Data: Postoperative film.  PORTABLE CHEST - 1 VIEW  Comparison: Chest x-ray 01/08/2012  Findings: An endotracheal tube is in place with tip 4.9 cm above the carina. Left-sided chest tube in place with tip and sideport projecting over the mid-left hemithorax.  Right internal jugular cordis through which a Swan Ganz catheter has been passed into the pulmonic trunk.    A nasogastric tube is seen extending into the distal esophagus (tip near the gastroesophageal junction), and marked tortuosity of the esophagus is noted.  A mediastinal drain is noted.  Status post median sternotomy for CABG.  Lung volumes are low.  Linear opacities throughout the lung bases bilaterally are favored to reflect mild postoperative subsegmental atelectasis. A dense nodule projecting over the right apex is unchanged and likely to reflect a calcified granuloma.  Mild enlargement of the cardiopericardial silhouette. The patient is rotated to the left on today's exam, resulting in distortion of the mediastinal contours  and reduced diagnostic sensitivity and specificity for mediastinal pathology.  Atherosclerosis in the thoracic aorta.  No definite pneumothorax.  IMPRESSION: 1.  Postoperative changes and support apparatus, as above. 2.  Low lung volumes with postoperative subsegmental atelectasis in the lung bases bilaterally. 3.  Atherosclerosis.   Original Report Authenticated By: Trudie Reed, M.D.      Assessment/Plan: S/P Procedure(s) (LRB): CORONARY ARTERY BYPASS GRAFTING (CABG) (N/A) ENDOVEIN HARVEST OF GREATER SAPHENOUS VEIN (Right) Mobilize Diuresis d/c tubes/lines Continue foley due to history of IC and chronic bladder problems leave fole in today and dc tomorrow Plan for transfer to  step-down: see transfer orders Hold beta blocker because of bradycardia     Taner Rzepka B 01/20/2012 9:33 AM

## 2012-01-21 ENCOUNTER — Inpatient Hospital Stay (HOSPITAL_COMMUNITY): Payer: Medicare Other

## 2012-01-21 DIAGNOSIS — I480 Paroxysmal atrial fibrillation: Secondary | ICD-10-CM | POA: Diagnosis not present

## 2012-01-21 LAB — GLUCOSE, CAPILLARY
Glucose-Capillary: 105 mg/dL — ABNORMAL HIGH (ref 70–99)
Glucose-Capillary: 135 mg/dL — ABNORMAL HIGH (ref 70–99)
Glucose-Capillary: 143 mg/dL — ABNORMAL HIGH (ref 70–99)

## 2012-01-21 LAB — BASIC METABOLIC PANEL
BUN: 20 mg/dL (ref 6–23)
CO2: 28 mEq/L (ref 19–32)
Calcium: 8.7 mg/dL (ref 8.4–10.5)
Chloride: 98 mEq/L (ref 96–112)
Creatinine, Ser: 0.97 mg/dL (ref 0.50–1.35)
GFR calc Af Amer: >90 mL/min (ref >90)
GFR calc non Af Amer: 78 mL/min — ABNORMAL LOW (ref >90)
Glucose, Bld: 113 mg/dL — ABNORMAL HIGH (ref 70–99)
Potassium: 3.7 mEq/L (ref 3.5–5.1)
Sodium: 133 mEq/L — ABNORMAL LOW (ref 135–145)

## 2012-01-21 LAB — CBC
HCT: 28.7 % — ABNORMAL LOW (ref 39.0–52.0)
Hemoglobin: 9.5 g/dL — ABNORMAL LOW (ref 13.0–17.0)
MCH: 29.1 pg (ref 26.0–34.0)
MCHC: 33.1 g/dL (ref 30.0–36.0)
MCV: 87.8 fL (ref 78.0–100.0)
Platelets: 134 10*3/uL — ABNORMAL LOW (ref 150–400)
RBC: 3.27 MIL/uL — ABNORMAL LOW (ref 4.22–5.81)
RDW: 17.4 % — ABNORMAL HIGH (ref 11.5–15.5)
WBC: 6.4 10*3/uL (ref 4.0–10.5)

## 2012-01-21 MED ORDER — PHENOL 1.4 % MT LIQD
1.0000 | OROMUCOSAL | Status: DC | PRN
  Administered 2012-01-21: 1 via OROMUCOSAL
  Filled 2012-01-21: qty 177

## 2012-01-21 MED ORDER — AMIODARONE LOAD VIA INFUSION
150.0000 mg | Freq: Once | INTRAVENOUS | Status: DC
  Administered 2012-01-21: 150 mg via INTRAVENOUS
  Filled 2012-01-21: qty 83.34

## 2012-01-21 MED ORDER — AMIODARONE HCL IN DEXTROSE 360-4.14 MG/200ML-% IV SOLN
60.0000 mg/h | INTRAVENOUS | Status: DC
  Administered 2012-01-21: 60 mg/h via INTRAVENOUS
  Filled 2012-01-21: qty 200

## 2012-01-21 MED ORDER — LOSARTAN POTASSIUM 25 MG PO TABS
25.0000 mg | ORAL_TABLET | Freq: Every day | ORAL | Status: DC
  Administered 2012-01-21 – 2012-01-22 (×2): 25 mg via ORAL
  Filled 2012-01-21 (×3): qty 1

## 2012-01-21 MED ORDER — AMIODARONE HCL IN DEXTROSE 360-4.14 MG/200ML-% IV SOLN
30.0000 mg/h | INTRAVENOUS | Status: DC
  Administered 2012-01-21: 30 mg/h via INTRAVENOUS
  Filled 2012-01-21: qty 200

## 2012-01-21 MED ORDER — METOPROLOL TARTRATE 1 MG/ML IV SOLN
5.0000 mg | Freq: Once | INTRAVENOUS | Status: AC
  Administered 2012-01-21: 5 mg via INTRAVENOUS
  Filled 2012-01-21: qty 5

## 2012-01-21 NOTE — Progress Notes (Addendum)
Pt complained of Foley catheter causing severe pain. MD paged and aware. Orders given to remove Foley. Foley removed per MD order and protocol. Pt tolerated procedure well and states pain is relieved now. Will continue to monitor.

## 2012-01-21 NOTE — Progress Notes (Signed)
Pt refused to walk at this time. Will try again later and will continue to monitor.

## 2012-01-21 NOTE — Progress Notes (Addendum)
[address]            [place] [address]          [phone]     3 Days Post-Op Procedure(s) (LRB): CORONARY ARTERY BYPASS GRAFTING (CABG) (N/A) ENDOVEIN HARVEST OF GREATER SAPHENOUS VEIN (Right)  Subjective: Pt developed rapid AF earlier this am, asymptomatic. He has otherwise been feeling pretty well except poor appetite and sore throat.    Objective: Vital signs in last 24 hours: Patient Vitals for the past 24 hrs:  BP Temp Temp src Pulse Resp SpO2 Weight  01/21/12 0758 126/86 mmHg - - 128  - 94 % -  01/21/12 0754 139/76 mmHg - - 125  - 94 % -  01/21/12 0752 121/85 mmHg - - 129  18  94 % -  01/21/12 0730 151/101 mmHg - - 148  - 96 % -  01/21/12 0316 140/72 mmHg 98 F (36.7 C) Oral 81  18  97 % 251 lb 6.4 oz (114.034 kg)  01/20/12 2034 142/68 mmHg 98.6 F (37 C) Oral 76  18  98 % -  01/20/12 1803 128/68 mmHg - - - - - -  01/20/12 1717 151/76 mmHg 98 F (36.7 C) - 85  18  98 % -  01/20/12 1658 - 99.6 F (37.6 C) Oral - - - -  01/20/12 1400 129/72 mmHg - - - 22  95 % -  01/20/12 1300 - - - - 23  - -  01/20/12 1223 - 98.6 F (37 C) Oral - - - -  01/20/12 1200 165/136 mmHg - - - 20  95 % -  01/20/12 1100 139/69 mmHg - - - 21  94 % -  01/20/12 0900 - - - - 20  - -   Current Weight  01/21/12 251 lb 6.4 oz (114.034 kg)  PRE-OPERATIVE WEIGHT: 112 kg    Intake/Output from previous day: 11/02 0701 - 11/03 0700 In: 60 [I.V.:60] Out: 1700 [Urine:1660; Chest Tube:40]  CBGs [phone]  PHYSICAL EXAM:  Heart: Irr irr, tachy around 120s Lungs: Clear Wound: Clean and dry Extremities: Mild LE edema    Lab Results: CBC: Basename 01/21/12 0615 01/20/12 0414  WBC 6.4 6.7  HGB 9.5* 9.0*  HCT 28.7* 27.1*  PLT 134* 122*   BMET:  Basename 01/21/12 0615 01/20/12 0414  NA 133* 139  K 3.7 3.8  CL 98 105  CO2 28 27  GLUCOSE 113* 125*  BUN 20 20  CREATININE 0.97 1.03  CALCIUM 8.7 8.4    PT/INR:  Basename 01/18/12 1411   LABPROT 16.7*  INR 1.39      Assessment/Plan: S/P Procedure(s) (LRB): CORONARY ARTERY BYPASS GRAFTING (CABG) (N/A) ENDOVEIN HARVEST OF GREATER SAPHENOUS VEIN (Right)  CV- early in post-op course had bradycardia, but this [place] in [place].  Cardiology ordered IV Lopressor, and this was given without change in rate.  Will start Amio gtt, but watch closely for bradycardia. He is still connected to the external pacer. Continue Cozaar for HTN.  Vol overload- continue diuresis.  Expected post op blood loss anemia- stable.  Elevated CBGs- continue low dose Lantus for now.  A1C=6.2. Not on meds at home.  GU- Foley to be d/c'ed today, but pt requests it be left in place secondary to h/o interstitial cystitis. Will leave until seen by MD.  CRPI, pulm toilet.   LOS:  3 days    COLLINS,GINA H 01/21/2012   now back in sinus episode of afib this am  Will leave foley because of voiding problems in past and patient request I have seen and examined Lucas Morales and agree with the above assessment  and plan.  Delight Ovens MD Beeper [phone] Office [phone] 01/21/2012 11:04 AM

## 2012-01-21 NOTE — Progress Notes (Signed)
Pt refused to walk at this time. Will try again later and continue to monitor.

## 2012-01-21 NOTE — Plan of Care (Signed)
Problem: Phase III Progression Outcomes Goal: Time patient transferred to PCTU/Telemetry POD Outcome: Completed/Met Date Met:  01/20/12 1715

## 2012-01-21 NOTE — Progress Notes (Signed)
PA notified, Pt converted back to NSR, rate in 60's . BP taken, see doc flow sheet. EKG completed.  Orders given and activated. Amiodarone d/c'ed per PA order. Will continue to monitor.

## 2012-01-21 NOTE — Progress Notes (Signed)
  Amiodarone Drug - Drug Interaction Consult Note  Recommendations: Monitor with amiodarone and atorvastatin.  Amiodarone is metabolized by the cytochrome P450 system and therefore has the potential to cause many drug interactions. Amiodarone has an average plasma half-life of 50 days (range 20 to 100 days).   There is potential for drug interactions to occur several weeks or months after stopping treatment and the onset of drug interactions may be slow after initiating amiodarone.   [x]  Statins: Increased risk of myopathy. Simvastatin- restrict dose to 20mg  daily. Other statins: counsel patients to report any muscle pain or weakness immediately.  Thank You,  Christoper Fabian, PharmD, BCPS Clinical pharmacist, pager [phone] 01/21/2012 7:24 AM

## 2012-01-21 NOTE — Progress Notes (Signed)
78 y/o man -- patient of Dr. Allyson Sabal --> recent cath for abnormal Myoview to evaluate crescendo angina.  MV Dz - stable for scheduled CABG 10/31.   Day #4 Post-Op:   CORONARY ARTERY BYPASS GRAFTING (CABG) (N/A)   ENDOVEIN HARVEST OF GREATER SAPHENOUS VEIN (Right)   Subjective:  Transferred to Tele yesterday Was doing well until this AM (with onset of Afib RVR) -- is a bit anxious & feels "worn out" from RVR.  Objective:  Vital Signs in the last 24 hours: Temp:  [98 F (36.7 C)-99.6 F (37.6 C)] 98 F (36.7 C) (11/03 0316) Pulse Rate:  [76-92] 81  (11/03 0316) Resp:  [18-23] 18  (11/03 0316) BP: (122-165)/(60-136) 140/72 mmHg (11/03 0316) SpO2:  [94 %-99 %] 97 % (11/03 0316) Weight:  [114.034 kg (251 lb 6.4 oz)] 114.034 kg (251 lb 6.4 oz) (11/03 0316)  Intake/Output from previous day:  Intake/Output Summary (Last 24 hours) at 01/21/12 0736 Last data filed at 01/21/12 0300  Gross per 24 hour  Intake     60 ml  Output   1700 ml  Net  -1640 ml    Physical Exam: General appearance: alert, cooperative - mild distress, anxious Lungs: decreased breath sounds in bases, but non-laboted Heart: Tachy Irreg/Irreg, no rub Abd: soft, NT. ND, NABS Extr: trace edema, no C/C   Rate:130-140  Rhythm: Afib RVR  Lab Results:  Basename 01/21/12 0615 01/20/12 0414  WBC 6.4 6.7  HGB 9.5* 9.0*  PLT 134* 122*    Basename 01/21/12 0615 01/20/12 0414  NA 133* 139  K 3.7 3.8  CL 98 105  CO2 28 27  GLUCOSE 113* 125*  BUN 20 20  CREATININE 0.97 1.03   Imaging: CXR pending - I personally reviewed, mild vascular congestion & effusions - fluid seen in R-fissure.  EF normal by OP Echo.  Assessment/Plan:   Principal Problem:  *S/P CABG x 4, elective, 01/18/12 Active Problems:  CAD -- mLAD 80&-60%, OM2 90%, OM3 70-80%, RPDA 90%, cath 01/11/12  Atrial fibrillation with rapid ventricular response; New onset, post-op CABG  Hypertension  Hyperlipidemia  Post OP Day#4.   Upon returning  from Radiology - went into rapid Afib ~140s -- I have ordered IV Lopressor, & amiodarone already ordered by CT Sgx.   Continue epicardial pacing wires in light of recent S Brady off BB - he may have issues with Tachybrady.  If he chemically converts, would not continue amiodarone long term.   Foley out -- good UOP, but still no BM.  Not eating much.  BP has improved. Was on ARB at home-- will start 1/2 of home dose.  Gentle diuresis - CXR with mild evidence of pulmonary vascular congestion/effusion  Not on statin at home -- started atorvastatin.  Marykay Lex, M.D., M.S. [facility] [address] [place], [place]  [address]  [phone] Pager # [phone] 01/21/2012 7:36 AM

## 2012-01-22 ENCOUNTER — Encounter (HOSPITAL_COMMUNITY): Payer: Self-pay | Admitting: *Deleted

## 2012-01-22 LAB — GLUCOSE, CAPILLARY
Glucose-Capillary: 124 mg/dL — ABNORMAL HIGH (ref 70–99)
Glucose-Capillary: 123 mg/dL — ABNORMAL HIGH (ref 70–99)
Glucose-Capillary: 116 mg/dL — ABNORMAL HIGH (ref 70–99)
Glucose-Capillary: 114 mg/dL — ABNORMAL HIGH (ref 70–99)
Glucose-Capillary: 129 mg/dL — ABNORMAL HIGH (ref 70–99)
Glucose-Capillary: 131 mg/dL — ABNORMAL HIGH (ref 70–99)

## 2012-01-22 MED ORDER — AMIODARONE HCL 200 MG PO TABS
200.0000 mg | ORAL_TABLET | Freq: Every day | ORAL | Status: DC
  Administered 2012-01-22 – 2012-01-23 (×2): 200 mg via ORAL
  Filled 2012-01-22 (×3): qty 1

## 2012-01-22 MED ORDER — METFORMIN HCL 500 MG PO TABS
500.0000 mg | ORAL_TABLET | Freq: Two times a day (BID) | ORAL | Status: DC
  Administered 2012-01-22 – 2012-01-27 (×11): 500 mg via ORAL
  Filled 2012-01-22 (×13): qty 1

## 2012-01-22 MED ORDER — LOSARTAN POTASSIUM 25 MG PO TABS: 25.0000 mg | ORAL_TABLET | Freq: Every day | ORAL | Status: DC

## 2012-01-22 MED ORDER — ATORVASTATIN CALCIUM 40 MG PO TABS: 40.0000 mg | ORAL_TABLET | Freq: Every day | ORAL | Status: DC

## 2012-01-22 MED ORDER — SODIUM CHLORIDE 0.9 % IV SOLN
300.0000 mg | Freq: Every day | INTRAVENOUS | Status: DC
  Filled 2012-01-22: qty 300

## 2012-01-22 MED ORDER — METFORMIN HCL 500 MG PO TABS: 500.0000 mg | ORAL_TABLET | Freq: Two times a day (BID) | ORAL | Status: DC

## 2012-01-22 MED ORDER — AMIODARONE HCL 200 MG PO TABS: 200.0000 mg | ORAL_TABLET | Freq: Two times a day (BID) | ORAL | Status: DC

## 2012-01-22 MED ORDER — ALLOPURINOL 300 MG PO TABS
300.0000 mg | ORAL_TABLET | Freq: Every day | ORAL | Status: DC
  Administered 2012-01-22 – 2012-01-26 (×5): 300 mg via ORAL
  Filled 2012-01-22 (×6): qty 1

## 2012-01-22 MED ORDER — DIPHENOXYLATE-ATROPINE 2.5-0.025 MG PO TABS
1.0000 | ORAL_TABLET | Freq: Four times a day (QID) | ORAL | Status: DC | PRN
  Administered 2012-01-22: 3 via ORAL
  Administered 2012-01-22: 1 via ORAL
  Administered 2012-01-23: 3 via ORAL
  Administered 2012-01-24 (×2): 1 via ORAL
  Filled 2012-01-22: qty 1
  Filled 2012-01-22: qty 2
  Filled 2012-01-22 (×2): qty 1
  Filled 2012-01-22: qty 3
  Filled 2012-01-22: qty 1

## 2012-01-22 MED ORDER — ASPIRIN 325 MG PO TBEC: 325.0000 mg | DELAYED_RELEASE_TABLET | Freq: Every day | ORAL | Status: DC

## 2012-01-22 MED ORDER — OXYCODONE HCL 5 MG PO TABS: 5.0000 mg | ORAL_TABLET | ORAL | Status: DC | PRN

## 2012-01-22 MED ORDER — METOPROLOL TARTRATE 25 MG PO TABS: 12.5000 mg | ORAL_TABLET | Freq: Two times a day (BID) | ORAL | Status: DC

## 2012-01-22 MED ORDER — METOPROLOL TARTRATE 12.5 MG HALF TABLET
12.5000 mg | ORAL_TABLET | Freq: Two times a day (BID) | ORAL | Status: DC
  Administered 2012-01-22 – 2012-01-23 (×4): 12.5 mg via ORAL
  Filled 2012-01-22 (×6): qty 1

## 2012-01-22 MED ORDER — LISINOPRIL 10 MG PO TABS: 10.0000 mg | ORAL_TABLET | Freq: Every day | ORAL | Status: DC

## 2012-01-22 MED FILL — Sodium Chloride Irrigation Soln 0.9%: Qty: 3000 | Status: AC

## 2012-01-22 MED FILL — Mannitol IV Soln 20%: INTRAVENOUS | Qty: 500 | Status: AC

## 2012-01-22 MED FILL — Sodium Chloride IV Soln 0.9%: INTRAVENOUS | Qty: 1000 | Status: AC

## 2012-01-22 MED FILL — Lidocaine HCl IV Inj 20 MG/ML: INTRAVENOUS | Qty: 5 | Status: AC

## 2012-01-22 MED FILL — Electrolyte-R (PH 7.4) Solution: INTRAVENOUS | Qty: 4000 | Status: AC

## 2012-01-22 MED FILL — Heparin Sodium (Porcine) Inj 1000 Unit/ML: INTRAMUSCULAR | Qty: 10 | Status: AC

## 2012-01-22 MED FILL — Sodium Bicarbonate IV Soln 8.4%: INTRAVENOUS | Qty: 50 | Status: AC

## 2012-01-22 MED FILL — Heparin Sodium (Porcine) Inj 1000 Unit/ML: INTRAMUSCULAR | Qty: 30 | Status: AC

## 2012-01-22 NOTE — Discharge Summary (Signed)
patient examined and medical record reviewed,agree with above note. Home on B-blocker and low dose amiodarone VAN TRIGT III,Manon Ban[place] 01/22/2012

## 2012-01-22 NOTE — Progress Notes (Signed)
[phone]  4 Days Post-Op Procedure(s) (LRB):  CORONARY ARTERY BYPASS GRAFTING (CABG) (N/A)  ENDOVEIN HARVEST OF GREATER SAPHENOUS VEIN  Subjective:   Objective: Vital signs in last 24 hours: Temp:  [97.4 F (36.3 C)-99.4 F (37.4 C)] 97.4 F (36.3 C) (11/04 0551) Pulse Rate:  [68-118] 81  (11/04 0551) Resp:  [18] 18  (11/03 2115) BP: (115-157)/(71-91) 139/83 mmHg (11/04 0551) SpO2:  [92 %-95 %] 94 % (11/04 0551) Weight:  [112.175 kg (247 lb 4.8 oz)] 112.175 kg (247 lb 4.8 oz) (11/04 0551) Weight change: -1.86 kg (-4 lb 1.6 oz)   Intake/Output from previous day: -1560 11/03 0701 - 11/04 0700 In: 240 [P.O.:240] Out: 1800 [Urine:1800] Intake/Output this shift:    PE: General: NCAT Heart:RRR w/o MGR Lungs:Clear UJW:JXBJ, NT YNW:GNFAO edema    Lab Results:  [facility] 01/21/12 0615 01/20/12 0414  WBC 6.4 6.7  HGB 9.5* 9.0*  HCT 28.7* 27.1*  PLT 134* 122*   BMET  Basename 01/21/12 0615 01/20/12 0414  NA 133* 139  K 3.7 3.8  CL 98 105  CO2 28 27  GLUCOSE 113* 125*  BUN 20 20  CREATININE 0.97 1.03  CALCIUM 8.7 8.4   Lab Results  Component Value Date   HGBA1C 6.2* 01/12/2012     Lab Results  Component Value Date   TSH 1.120 01/12/2012        EKG: Orders placed during the hospital encounter of 01/18/12  . EKG 12-LEAD  . EKG 12-LEAD  . EKG 12-LEAD  . EKG 12-LEAD  . EKG 12-LEAD  . EKG 12-LEAD  . EKG 12-LEAD  . EKG 12-LEAD    Studies/Results: Dg Chest 2 View  01/21/2012  *RADIOLOGY REPORT*  Clinical Data: Coronary bypass  CHEST - 2 VIEW  Comparison: 01/20/2012  Findings: Right IJ vascular sheath removed.  Coronary bypass changes noted.  Stable cardiomegaly with vascular congestion and basilar atelectasis. Small effusions noted.  No pneumothorax. Stable exam.  IMPRESSION: Stable cardiomegaly with vascular congestion  Residual basilar atelectasis and pleural effusions   Original Report Authenticated By: Judie Petit. Miles Costain, M.D.     Medications: I have  reviewed the patient's current medications.    [name] allopurinol (ZYLOPRIM) IVPB  300 mg Intravenous Daily  . aspirin EC  325 mg Oral Daily  . atorvastatin  40 mg Oral q1800  . calcium carbonate  3 tablet Oral Daily  . desmopressin  0.2 mg Oral QHS  . docusate sodium  200 mg Oral Daily  . insulin aspart  0-24 Units Subcutaneous TID AC & HS  . losartan  25 mg Oral Daily  . metoprolol tartrate  12.5 mg Oral BID  . multivitamin with minerals  1 tablet Oral Daily  . nortriptyline  10 mg Oral QHS  . pantoprazole  40 mg Oral Q1200  . sodium chloride  3 mL Intravenous Q12H  . [DISCONTINUED] enoxaparin  30 mg Subcutaneous Q24H  . [DISCONTINUED] insulin aspart  0-24 Units Subcutaneous Q4H  . [DISCONTINUED] insulin glargine  20 Units Subcutaneous BID   Assessment/Plan: Principal Problem:  *S/P CABG x 4, elective, 01/18/12 Active Problems:  Hypertension  Hyperlipidemia  CAD -- mLAD 80&-60%, OM2 90%, OM3 70-80%, RPDA 90%, cath 01/11/12  Atrial fibrillation with rapid ventricular response; New onset, post-op CABG  PLAN:  PAF yesterday maintaining SR now.  LOS: 4 days   INGOLD,LAURA R 01/22/2012, 8:12 AM  Agree with note written by Nada Boozer RNP  S/P CABG for severe 3VD, POD # 4. Looks great.  Ambulating with [facility]. Brief PAF yesterday. Exam benign. Trace BLE edema. Labs OK. Plan per TCTS. Prob home on Wed.[name] ROV with me 2-3 weeks.  Runell Gess 01/22/2012 9:22 AM

## 2012-01-22 NOTE — Progress Notes (Signed)
CARDIAC REHAB PHASE I   PRE:  Rate/Rhythm: 93SR PACs  BP:  Supine:   Sitting: 152/64  Standing:    SaO2: 94%RA  MODE:  Ambulation: 550 ft   POST:  Rate/Rhythem: 104ST PACs  BP:  Supine:   Sitting: 140/79  Standing:    SaO2: 91%RA 1130-1155 Pt walked 550 ft with rolling walker with asst x 1. Tolerated well. States he has rolling walker at home. To recliner after walk. Pt very motivated and wants to walk.Call bell in reach. Permission given to refer to [facility] Phase 2.  Lucas Morales

## 2012-01-22 NOTE — Progress Notes (Addendum)
[address]            [place] [address]          [phone]     4 Days Post-Op  Procedure(s) (LRB): CORONARY ARTERY BYPASS GRAFTING (CABG) (N/A) ENDOVEIN HARVEST OF GREATER SAPHENOUS VEIN (Right) Subjective: Feels ok  Objective  Telemetry sinus with PVC's  Temp:  [97.4 F (36.3 C)-99.4 F (37.4 C)] 97.4 F (36.3 C) (11/04 0551) Pulse Rate:  [68-129] 81  (11/04 0551) Resp:  [18] 18  (11/03 2115) BP: (115-157)/(71-91) 139/83 mmHg (11/04 0551) SpO2:  [92 %-95 %] 94 % (11/04 0551) Weight:  [247 lb 4.8 oz (112.175 kg)] 247 lb 4.8 oz (112.175 kg) (11/04 0551)   Intake/Output Summary (Last 24 hours) at 01/22/12 0751 Last data filed at 01/22/12 0500  Gross per 24 hour  Intake    240 ml  Output   1800 ml  Net  -1560 ml       General appearance: alert, cooperative and no distress Heart: regular rate and rhythm Lungs: mildly dim in bases Abdomen: benign Extremities: min edema Wound: incisions healing well  Lab Results:  Basename 01/21/12 0615 01/20/12 0414 01/19/12 1700  NA 133* 139 --  K 3.7 3.8 --  CL 98 105 --  CO2 28 27 --  GLUCOSE 113* 125* --  BUN 20 20 --  CREATININE 0.97 1.03 --  CALCIUM 8.7 8.4 --  MG -- -- 2.3  PHOS -- -- --   No results found for this basename: AST:2,ALT:2,ALKPHOS:2,BILITOT:2,PROT:2,ALBUMIN:2 in the last 72 hours No results found for this basename: LIPASE:2,AMYLASE:2 in the last 72 hours  Basename 01/21/12 0615 01/20/12 0414  WBC 6.4 6.7  NEUTROABS -- --  HGB 9.5* 9.0*  HCT 28.7* 27.1*  MCV 87.8 87.4  PLT 134* 122*   No results found for this basename: CKTOTAL:4,CKMB:4,TROPONINI:4 in the last 72 hours No components found with this basename: POCBNP:3 No results found for this basename: DDIMER in the last 72 hours No results found for this basename: HGBA1C in the last 72 hours No results found for this basename: CHOL,HDL,LDLCALC,TRIG,CHOLHDL in the last 72 hours No results found for this  basename: TSH,T4TOTAL,FREET3,T3FREE,THYROIDAB in the last 72 hours No results found for this basename: VITAMINB12,FOLATE,FERRITIN,TIBC,IRON,RETICCTPCT in the last 72 hours  Medications: Scheduled    . aspirin EC  325 mg Oral Daily  . atorvastatin  40 mg Oral q1800  . calcium carbonate  3 tablet Oral Daily  . desmopressin  0.2 mg Oral QHS  . docusate sodium  200 mg Oral Daily  . enoxaparin  30 mg Subcutaneous Q24H  . insulin aspart  0-24 Units Subcutaneous TID AC & HS  . insulin glargine  20 Units Subcutaneous BID  . losartan  25 mg Oral Daily  . multivitamin with minerals  1 tablet Oral Daily  . nortriptyline  10 mg Oral QHS  . pantoprazole  40 mg Oral Q1200  . sodium chloride  3 mL Intravenous Q12H  . [DISCONTINUED] insulin aspart  0-24 Units Subcutaneous Q4H     Radiology/Studies:  Dg Chest 2 View  01/21/2012  *RADIOLOGY REPORT*  Clinical Data: Coronary bypass  CHEST - 2 VIEW  Comparison: 01/20/2012  Findings: Right IJ vascular sheath removed.  Coronary bypass changes noted.  Stable cardiomegaly with vascular congestion and basilar atelectasis. Small effusions noted.  No pneumothorax. Stable exam.  IMPRESSION: Stable  cardiomegaly with vascular congestion  Residual basilar atelectasis and pleural effusions   Original Report Authenticated By: Judie Petit. Miles Costain, M.D.     INR: Will add last result for INR, ABG once components are confirmed Will add last 4 CBG results once components are confirmed  Assessment/Plan: S/P Procedure(s) (LRB): CORONARY ARTERY BYPASS GRAFTING (CABG) (N/A) ENDOVEIN HARVEST OF GREATER SAPHENOUS VEIN (Right)  1 doing well 2 push rehab and pulm toilet 3 monitor rhythm 4 cbg' s ok- d/c lantus at this time 5 poss home in am   LOS: 4 days    GOLD,WAYNE E 11/4/20137:51 AM    patient examined and medical record reviewed,agree with above note.    Lopressor and amiodarone started today Will need to monitor today and tomorrow with DC planned for Wed if he  maintains sinus rhythm VAN TRIGT III,PETER 01/22/2012

## 2012-01-22 NOTE — Progress Notes (Signed)
Ambulated 700 ft with RW, no complaints.  Returned to chair with call bell in reach.  Will con't plan of care.

## 2012-01-22 NOTE — Progress Notes (Signed)
Pt ambulated with rolling walker for 800 ft. Pt tolerated well and no complaints. Call bell within reach.

## 2012-01-22 NOTE — Discharge Summary (Addendum)
Physician Discharge Summary  Patient ID: Lucas Morales MRN: [id number] DOB/AGE: 1933/06/28 78 y.o.  Admit date: 01/18/2012 Discharge date: 01/27/2012  Admission Diagnoses:  Patient Active Problem List  Diagnosis  . HEMORRHOIDS  . ESOPHAGEAL STRICTURE  . GERD  . GASTRITIS  . HIATAL HERNIA  . DIVERTICULOSIS, COLON  . INTERSTITIAL CYSTITIS  . ABDOMINAL PAIN-LLQ  . COLONIC POLYPS, ADENOMATOUS, HX OF  . Abnormal nuclear cardiac imaging test -- Inferolateral ischemia; Coronary CTA in 2010, mild-moderate 3 V plaque; Echo - EF >55%, moderate to severe LVH  . Hypertension  . Hyperlipidemia  . Chest pain on exertion  . Shortness of breath on exertion  . Bradycardia, sinus  . CAD -- mLAD 80&-60%, OM2 90%, OM3 70-80%, RPDA 90%, cath 01/11/12  . Atrial fibrillation with rapid ventricular response; New onset, post-op CABG   Discharge Diagnoses:   Patient Active Problem List  Diagnosis  . HEMORRHOIDS  . ESOPHAGEAL STRICTURE  . GERD  . GASTRITIS  . HIATAL HERNIA  . DIVERTICULOSIS, COLON  . INTERSTITIAL CYSTITIS  . ABDOMINAL PAIN-LLQ  . COLONIC POLYPS, ADENOMATOUS, HX OF  . Abnormal nuclear cardiac imaging test -- Inferolateral ischemia; Coronary CTA in 2010, mild-moderate 3 V plaque; Echo - EF >55%, moderate to severe LVH  . Hypertension  . Hyperlipidemia  . Chest pain on exertion  . Shortness of breath on exertion  . Bradycardia, sinus  . CAD -- mLAD 80&-60%, OM2 90%, OM3 70-80%, RPDA 90%, cath 01/11/12  . S/P CABG x 4, elective, 01/18/12  . Atrial fibrillation with rapid ventricular response; New onset, post-op CABG   Discharged Condition: good  History of Present Illness:   Lucas Morales is a 78 yo male white male with known history of Hypertension, hyperlipidemia and obesity who presented to his PCP with a complaint of progressive exertional dyspnea and decrease in exercise tolerance.  He also has some exertional chest heaviness or pressure.  His symptoms were relieved  by rest.  He underwent stress test which was positive for ischemia.  Therefore he was referred to Cardiology for cardiac catheterization which revealed severe multivessel CAD.  Due to these findings he was referred to [facility] for possible coronary bypass procedure.  He was evaluated by Lucas Morales on 01/12/2012 at which time it was felt he would benefit from coronary artery bypass grafting.  The risks and benefits of the procedure were explained to the patient and he was agreeable to proceed.  Surgery was scheduled for 01/18/2012.    Hospital Course:   The patient presented to [facility] on 01/18/2012.  He was taken to the operating room and underwent CABG x utilizing LIMA to LAD, a sequential SG to OM3 and OM3, and SG to RCA.  He also underwent Endoscopic saphenous vein harvest of his right leg.  The patient tolerated the procedure well and was taken to the SI CU in stable condition.  POD #0 the patient was extubated.  POD #1 patient was experiencing sinus bradycardia requiring atrial pacing.  He was weaned off his insulin drip as tolerated.  POD #2 the patient's chest tubes and wires were removed without difficulty.  He was medically stable and transferred to the step down unit in stable condition.  POD #3 patient developed rapid atrial fibrillation.  He was treated with IV Lopressor which did not improve the patient's heart rate.  Therefore, he was treated with Amiodarone with conversion to Normal sinus rhythm.  POD #4 the patient is maintaining NSR.  He was placed on Metformin for treatment of his new onset diabetes.  The patient was also started on a low dose ACE-I for hypertension. The patient was doing well and we initially had hoped to discharge him earlier in the week;however, he went back into afib with RVR. He was treated with Cardizem drip and Digoxin. His Amiodarone and Lopressor were increased as well. He was also placed on Coumadin. His last INR was 1.2. He converted to sinus rhythm last  evening and is maintaining this.Provided he remains afebrile, hemodynamically stable, and pending morning round evaluation, he will be surgically stable for discharge in the am. He will need to contact Lucas Morales office to have a PT and INR drawn on Monday 01/29/2012.The patient will follow up with Lucas Morales on 02/14/2012 at 1:00 pm.  He will need to have a chest xray done 1 hour prior to his appointment with Lucas Morales.  The patient will also need to follow up with Lucas Morales in addition to his PCP.        Significant Diagnostic Studies: Cardiac Catheterization   Hemodynamics:  Central Aortic / Mean Pressures: 110 mmHg; 76 mmHg  Left Ventricular Pressures / EDP: 108/10 mmHg; 12 mmHg EF: ~45 % by Echo Coronary Anatomy:  Left Main: Large caliber vessel that bifurcates normally into the LAD & Circumflex; angiographically normal LAD: The vessel begins as a large vessel that gives off a small to moderate sized D1 and a large / major Septal perforator "trunk" that provides septal circulation for most of the proximal and mid septum with 3 main branches. The LAD after D1 has a focal ~80% stenosis followed by diffuse moderate (~50%) disease in the mid portion where 2 small (not named) diagonal branches arise. Distally, the vessel bifurcates into a Moderate caliber D2 and a smaller caliber terminal LAD that tapers off towards, but does not reach, the Apex.  D1: small to moderate caliber vessel that bifurcates and covers the basal anterolateral wall; minimal luminal irregularities  D2: Moderate caliber vessel that arises from the distal LAD and covers a large territory in the distal/apical anterolateral wall; minimal luminal irregularities Left Circumflex: Large caliber (larger than the LAD) vessel that is appears to be co-dominant. The vessel gives off a proximal OM1, then courses in the AV Groove where it gives off a very small OM branch (OMB) followed by a small OM2. Shortly after OM2, there is  an eccentric ~60% lesion where a small AVGroove branch followed by another small OMB arises before the vessel bifurcates into  OM3: Moderate to large caliber vessel that travel almost in tandem with OM4 along the inferolateral wall to the apex; Proximal focal 80% followed by ~90% stenosis, then minimal luminal irregularities distally  OM4: Moderate to large caliber vessel; proximal ~70% stenosis, then diffuse mild luminal irregularities  LPL System: after OM4, the follow-on circumflex continues into the posterior AV groove where it branches into several small LPL branches. RCA: Large caliber "co-dominant" vessel, at least 2 major RV marginal branches arise from the mid vessel. Distally, the vessel tapers until it bifurcates into what appears to be tandem RPDA branches with the first being a relatively small / insignificant branch, while the more distal branch courses in what looks to be the Posterior AV Groove then has the appearance of a 2nd &major RPDA (with septal perforators) that reaches the apex.  RPDA: Small to moderate caliber vessel that reaches the apex; proximal tubular ~90% stenosis prior to AV  Nodal RPL branch followed by a long segment with diffuse moderate to severe stenoses (50-70%) until the mid vessel that is relatively free of disease.  RPL System: minimal branch with AV Nodal artery arising from the 2nd RPDA.  Treatments: surgery:   1. Coronary artery bypass grafting x4 (left internal mammary artery to left anterior descending, saphenous vein graft to distal posterior descending, sequential saphenous vein graft to obtuse marginal 3 and obtuse marginal 4.   2. Endoscopic harvest of right leg greater saphenous vein   The patient has been discharged on:   1.Beta Blocker:  Yes [  x ]                              No   [   ]                              If No, reason:  2.Ace Inhibitor/ARB: Yes [ x  ]                                     No  [    ]                                      If No, reason:  3.Statin:   Yes [x   ]                  No  [   ]                  If No, reason:  4.Ecasa:  Yes  [ x  ]                  No   [   ]                  If No, reason:     Disposition: 01-Home or Self Care  Discharge Orders    Future Appointments: Provider: Department: Dept Phone: Center:   02/14/2012 1:00 PM Kerin Perna, MD [facility] [phone] [facility]     Future Orders Please Complete By Expires   Amb Referral to Cardiac Rehabilitation      Discharge instructions      Comments:   Do Not resume Cialis until cleared by Cardiology to proceed with sexual activity        Medication List     As of 01/26/2012 10:42 AM    STOP taking these medications         nitroGLYCERIN 0.4 MG SL tablet   Commonly known as: NITROSTAT      TAKE these medications         allopurinol 300 MG tablet   Commonly known as: ZYLOPRIM   Take 300 mg by mouth daily.      amiodarone 400 MG tablet   Commonly known as: PACERONE   Take 1 tablet (400 mg total) by mouth 2 (two) times daily. For 5 days then take Amiodarone 400 mg po daily thereafter      aspirin 325 MG EC tablet   Take 1 tablet (325 mg total) by mouth daily.      atorvastatin 40  MG tablet   Commonly known as: LIPITOR   Take 1 tablet (40 mg total) by mouth daily at 6 PM.      calcium carbonate 750 MG chewable tablet   Commonly known as: TUMS EX   Chew 2 tablets by mouth daily.      desmopressin 0.2 MG tablet   Commonly known as: DDAVP   Take 0.2 mg by mouth at bedtime.      digoxin 0.25 MG tablet   Commonly known as: LANOXIN   Take 1 tablet (0.25 mg total) by mouth daily.      loperamide 2 MG capsule   Commonly known as: IMODIUM   Take 6 mg by mouth daily.      losartan 50 MG tablet   Commonly known as: COZAAR   Take 1 tablet (50 mg total) by mouth daily.      metFORMIN 500 MG tablet   Commonly known as: GLUCOPHAGE   Take 1 tablet (500 mg total) by  mouth 2 (two) times daily with a meal.      metoprolol tartrate 25 MG tablet   Commonly known as: LOPRESSOR   Take 1 tablet (25 mg total) by mouth 2 (two) times daily.      multivitamin with minerals tablet   Take 1 tablet by mouth daily.      nortriptyline 10 MG capsule   Commonly known as: PAMELOR   Take 10 mg by mouth at bedtime.      OVER THE COUNTER MEDICATION   Take 5 g by mouth daily. Fiber Well      oxyCODONE 5 MG immediate release tablet   Commonly known as: Oxy IR/ROXICODONE   Take 1 tablet (5 mg total) by mouth every 4 (four) hours as needed.      pantoprazole 40 MG tablet   Commonly known as: PROTONIX   Take 40 mg by mouth daily.      tadalafil 5 MG tablet   Commonly known as: CIALIS   Take 5 mg by mouth daily as needed. For erectile dysfunction      warfarin 5 MG tablet   Commonly known as: COUMADIN   Take 1 tablet (5 mg total) by mouth daily at 6 PM. Or as directed             Follow-up Information    Follow up with Runell Gess, MD. On 02/12/2012. (at 10:00 am)    Contact information:   [address] [place] [address] [phone]       Follow up with VAN Dinah Beers, MD.   Contact information:   [address] [place] [address] [phone]       Follow up with [facility].   Contact information:   [address] [place] [address]         Follow up with Dr. Renne Crigler regarding [facility] 6.2     Call Lucas Morales office to have a PT and INR drawn on Monday 01/29/2012  Signed: Lowella Dandy 01/22/2012, 12:30 PM

## 2012-01-23 ENCOUNTER — Inpatient Hospital Stay (HOSPITAL_COMMUNITY): Payer: Medicare Other

## 2012-01-23 LAB — CBC
HCT: 29.5 % — ABNORMAL LOW (ref 39.0–52.0)
Hemoglobin: 10.1 g/dL — ABNORMAL LOW (ref 13.0–17.0)
MCH: 29.8 pg (ref 26.0–34.0)
MCHC: 34.2 g/dL (ref 30.0–36.0)
MCV: 87 fL (ref 78.0–100.0)
Platelets: 171 10*3/uL (ref 150–400)
RBC: 3.39 MIL/uL — ABNORMAL LOW (ref 4.22–5.81)
RDW: 17.3 % — ABNORMAL HIGH (ref 11.5–15.5)
WBC: 6.1 10*3/uL (ref 4.0–10.5)

## 2012-01-23 LAB — GLUCOSE, CAPILLARY
Glucose-Capillary: 120 mg/dL — ABNORMAL HIGH (ref 70–99)
Glucose-Capillary: 112 mg/dL — ABNORMAL HIGH (ref 70–99)
Glucose-Capillary: 137 mg/dL — ABNORMAL HIGH (ref 70–99)
Glucose-Capillary: 118 mg/dL — ABNORMAL HIGH (ref 70–99)

## 2012-01-23 LAB — COMPREHENSIVE METABOLIC PANEL
ALT: 16 U/L (ref 0–53)
AST: 17 U/L (ref 0–37)
Albumin: 3.1 g/dL — ABNORMAL LOW (ref 3.5–5.2)
Alkaline Phosphatase: 73 U/L (ref 39–117)
BUN: 20 mg/dL (ref 6–23)
CO2: 25 mEq/L (ref 19–32)
Calcium: 8.8 mg/dL (ref 8.4–10.5)
Chloride: 101 mEq/L (ref 96–112)
Creatinine, Ser: 0.98 mg/dL (ref 0.50–1.35)
GFR calc Af Amer: >90 mL/min (ref >90)
GFR calc non Af Amer: 78 mL/min — ABNORMAL LOW (ref >90)
Glucose, Bld: 127 mg/dL — ABNORMAL HIGH (ref 70–99)
Potassium: 3.6 mEq/L (ref 3.5–5.1)
Sodium: 135 mEq/L (ref 135–145)
Total Bilirubin: 0.9 mg/dL (ref 0.3–1.2)
Total Protein: 5.8 g/dL — ABNORMAL LOW (ref 6.0–8.3)

## 2012-01-23 LAB — URINALYSIS, ROUTINE W REFLEX MICROSCOPIC
Bilirubin Urine: NEGATIVE
Glucose, UA: NEGATIVE mg/dL
Hgb urine dipstick: NEGATIVE
Ketones, ur: NEGATIVE mg/dL
Leukocytes, UA: NEGATIVE
Nitrite: NEGATIVE
Protein, ur: NEGATIVE mg/dL
Specific Gravity, Urine: 1.011 (ref 1.005–1.030)
Urobilinogen, UA: 1 mg/dL (ref 0.0–1.0)
pH: 5 (ref 5.0–8.0)

## 2012-01-23 MED ORDER — LOSARTAN POTASSIUM 50 MG PO TABS: 50.0000 mg | ORAL_TABLET | Freq: Every day | ORAL | Status: DC

## 2012-01-23 MED ORDER — FUROSEMIDE 40 MG PO TABS: 40.0000 mg | ORAL_TABLET | Freq: Every day | ORAL | Status: DC

## 2012-01-23 MED ORDER — POTASSIUM CHLORIDE CRYS ER 20 MEQ PO TBCR
20.0000 meq | EXTENDED_RELEASE_TABLET | Freq: Every day | ORAL | Status: DC
  Administered 2012-01-23 – 2012-01-26 (×3): 20 meq via ORAL
  Filled 2012-01-23 (×6): qty 1

## 2012-01-23 MED ORDER — METFORMIN HCL 500 MG PO TABS: 500.0000 mg | ORAL_TABLET | Freq: Two times a day (BID) | ORAL | Status: DC

## 2012-01-23 MED ORDER — POTASSIUM CHLORIDE CRYS ER 20 MEQ PO TBCR: 20.0000 meq | EXTENDED_RELEASE_TABLET | Freq: Two times a day (BID) | ORAL | Status: DC

## 2012-01-23 MED ORDER — POTASSIUM CHLORIDE CRYS ER 20 MEQ PO TBCR
40.0000 meq | EXTENDED_RELEASE_TABLET | Freq: Once | ORAL | Status: AC
  Administered 2012-01-23: 40 meq via ORAL

## 2012-01-23 MED ORDER — FUROSEMIDE 10 MG/ML IJ SOLN
40.0000 mg | Freq: Once | INTRAMUSCULAR | Status: AC
  Administered 2012-01-23: 40 mg via INTRAVENOUS
  Filled 2012-01-23: qty 4

## 2012-01-23 MED ORDER — AMIODARONE HCL 200 MG PO TABS: 200.0000 mg | ORAL_TABLET | Freq: Every day | ORAL | Status: DC

## 2012-01-23 MED ORDER — LIVING WELL WITH DIABETES BOOK
Freq: Once | Status: AC
  Administered 2012-01-23: 11:00:00
  Filled 2012-01-23: qty 1

## 2012-01-23 MED ORDER — POTASSIUM CHLORIDE CRYS ER 20 MEQ PO TBCR: 20.0000 meq | EXTENDED_RELEASE_TABLET | Freq: Every day | ORAL | Status: DC

## 2012-01-23 MED ORDER — LOSARTAN POTASSIUM 50 MG PO TABS
50.0000 mg | ORAL_TABLET | Freq: Every day | ORAL | Status: DC
  Administered 2012-01-23 – 2012-01-26 (×4): 50 mg via ORAL
  Filled 2012-01-23 (×5): qty 1

## 2012-01-23 NOTE — Progress Notes (Signed)
Pt ambulated 500 ft with RW.  Gait unsteady at times, mild SOB.  Complains of mild pain under right arm.  PRN pain med given per order.  Helped to bed with call in bell in reach.  Will con't plan of care.

## 2012-01-23 NOTE — Progress Notes (Signed)
Inpatient Diabetes Program Recommendations  AACE/ADA: New Consensus Statement on Inpatient Glycemic Control (2013)  Target Ranges:  Prepandial:   less than 140 mg/dL      Peak postprandial:   less than 180 mg/dL (1-2 hours)      Critically ill patients:  140 - 180 mg/dL   Diabetes Coordinator spoke with patient concerning A1C=6.2 and starting Metformin.  Discussed how Metformin works and gave patient handout about Metformin.  Also discussed basic carb counting and meal planning.  Patient reports he has a glucose meter and monitors his glucose at home.  Encouraged patient to continue doing this and to follow-up with his primary MD.  Patient reports that he visits a pharmacist concerning his prediabetes. Encouraged patient to continue follow-up with this pharmacist.  Asked patient if he was interested in further outpatient DM education at the [facility] and he said "no".  He said that he learns best on the internet. Internet based resources were given to him. Handouts and brochures given for carb counting and meal planning as well.  No further questions or concerns at this time.    Thank you  Piedad Climes RN,BSN,CDE Inpatient Diabetes Coordinator [phone] (team pager)

## 2012-01-23 NOTE — Progress Notes (Signed)
Pt with episode of interstitial cystitis.  PRN pain med given per order.   Will report to night RN.

## 2012-01-23 NOTE — Progress Notes (Signed)
5 Days Post-Op Procedure(s) (LRB):  CORONARY ARTERY BYPASS GRAFTING (CABG) (N/A)  ENDOVEIN HARVEST OF GREATER SAPHENOUS VEIN   Subjective: No complaints  Objective: Vital signs in last 24 hours: Temp:  [97.7 F (36.5 C)-98.6 F (37 C)] 98.6 F (37 C) (11/05 0354) Pulse Rate:  [80-95] 80  (11/05 0354) Resp:  [18] 18  (11/05 0354) BP: (133-163)/(69-93) 150/69 mmHg (11/05 0501) SpO2:  [94 %-95 %] 95 % (11/05 0354) Weight:  [111.585 kg (246 lb)] 111.585 kg (246 lb) (11/05 0354) Weight change: -0.59 kg (-1 lb 4.8 oz) Last BM Date: 01/22/12 Intake/Output from previous day: +760  11/04 0701 - 11/05 0700 In: 960 [P.O.:960] Out: 200 [Urine:200] Intake/Output this shift:    PE: General:MD to see and examine Heart: Lungs: Abd: Ext:    Lab Results:  Basename 01/23/12 0450 01/21/12 0615  WBC 6.1 6.4  HGB 10.1* 9.5*  HCT 29.5* 28.7*  PLT 171 134*   BMET  Basename 01/23/12 0450 01/21/12 0615  NA 135 133*  K 3.6 3.7  CL 101 98  CO2 25 28  GLUCOSE 127* 113*  BUN 20 20  CREATININE 0.98 0.97  CALCIUM 8.8 8.7   Lab Results  Component Value Date   HGBA1C 6.2* 01/12/2012     Lab Results  Component Value Date   TSH 1.120 01/12/2012    Hepatic Function Panel  Basename 01/23/12 0450  PROT 5.8*  ALBUMIN 3.1*  AST 17  ALT 16  ALKPHOS 73  BILITOT 0.9  BILIDIR --  IBILI --    Studies/Results: Dg Chest 2 View  01/23/2012  *RADIOLOGY REPORT*  Clinical Data: Sob post op cabg  CHEST - 2 VIEW  Comparison: 01/21/2012  Findings: In addition to the moderate size retrocardiac hiatal hernia, there is abnormal persistent retrocardiac airspace opacity in the left lower lobe along with left greater than right pleural effusions.  Cardiothoracic index 63% using thoracic diameter at the level of the right hemidiaphragm on this PA image.  Small stable calcified granulomas noted projecting in the right upper lobe.  Epicardial pacer leads noted.  No overt edema.  IMPRESSION:  1.  Left  lower lobe airspace opacity may reflect atelectasis or less likely pneumonia. 2.  Small left and trace right pleural effusions. 3.  Cardiomegaly without overt edema. 4.  Old granulomatous disease. 5.  Hiatal hernia.   Original Report Authenticated By: Gaylyn Rong, M.D.     Medications: I have reviewed the patient's current medications.    [name] allopurinol  300 mg Oral Daily  . amiodarone  200 mg Oral Daily  . aspirin EC  325 mg Oral Daily  . atorvastatin  40 mg Oral q1800  . calcium carbonate  3 tablet Oral Daily  . desmopressin  0.2 mg Oral QHS  . docusate sodium  200 mg Oral Daily  . furosemide  40 mg Intravenous Once  . insulin aspart  0-24 Units Subcutaneous TID AC & HS  . losartan  50 mg Oral Daily  . metFORMIN  500 mg Oral BID WC  . metoprolol tartrate  12.5 mg Oral BID  . multivitamin with minerals  1 tablet Oral Daily  . nortriptyline  10 mg Oral QHS  . pantoprazole  40 mg Oral Q1200  . potassium chloride  20 mEq Oral Daily  . potassium chloride  40 mEq Oral Once  . sodium chloride  3 mL Intravenous Q12H  . [DISCONTINUED] allopurinol (ZYLOPRIM) IVPB  300 mg Intravenous Daily  . [DISCONTINUED] amiodarone  200 mg Oral BID  . [DISCONTINUED] furosemide  40 mg Oral Daily  . [DISCONTINUED] losartan  25 mg Oral Daily  . [DISCONTINUED] potassium chloride  20 mEq Oral BID  . [DISCONTINUED] potassium chloride  20 mEq Oral Daily   Assessment/Plan: Principal Problem:  *S/P CABG x 4, elective, 01/18/12 Active Problems:  Hypertension  Hyperlipidemia  CAD -- mLAD 80&-60%, OM2 90%, OM3 70-80%, RPDA 90%, cath 01/11/12  Atrial fibrillation with rapid ventricular response; New onset, post-op CABG  PLAN:no PAF, freq PACs.on amioadarone, lopressor IV lasix was given this am for volume overload.  LOS: 5 days   INGOLD,LAURA R 01/23/2012, 9:22 AM   Agree with note written by Nada Boozer RNP  S/P CABG. No further PAF. Gentle diuresis. CRH. Prob home AM per [facility]. ROV with me 2-3  weeks.  Runell Gess 01/23/2012 10:05 AM

## 2012-01-23 NOTE — Progress Notes (Signed)
EPWs removed per order and unit protocol.  Pt tolerate very well.  All tips intact, sites painted, VSS.  Bedrest understood for one hour, will monitor closely.

## 2012-01-23 NOTE — Progress Notes (Signed)
Pt ambulated 800 ft with rolling walker. Pt backed to recliner and asked for prn pain medicine. Pt continuously c/o chronic interstitial cystitis. Will continue to monitor.

## 2012-01-23 NOTE — Progress Notes (Signed)
CARDIAC REHAB PHASE I   PRE:  Rate/Rhythm: 93SR  BP:  Supine: 130/80  Sitting:   Standing:    SaO2: 92%RA  MODE:  Ambulation: 430 ft   POST:  Rate/Rhythem: 107ST  BP:  Supine:   Sitting: 153/82  Standing:    SaO2: 92%RA 1025-1057 Pt c/o severe right shoulder pain. Cut walk short due to discomfort. Walked 430 ft on RA with rolling walker and asst x 1. Slightly SOB. To recliner after walk. Notified pt's RN of need for pain med. Gave Phase 2 brochure and discussed class times. Call bell in reach.  Lucas Morales

## 2012-01-23 NOTE — Progress Notes (Addendum)
                   [address]            [place] [address]          [phone]      5 Days Post-Op Procedure(s) (LRB): CORONARY ARTERY BYPASS GRAFTING (CABG) (N/A) ENDOVEIN HARVEST OF GREATER SAPHENOUS VEIN (Right)  Subjective: Patient not sleeping well;otherwise, feels ok  Objective: Vital signs in last 24 hours: Temp:  [97.1 F (36.2 C)-98.6 F (37 C)] 98.6 F (37 C) (11/05 0354) Pulse Rate:  [80-95] 80  (11/05 0354) Cardiac Rhythm:  [-] Normal sinus rhythm (11/04 1905) Resp:  [18] 18  (11/05 0354) BP: (133-163)/(69-93) 150/69 mmHg (11/05 0501) SpO2:  [94 %-95 %] 95 % (11/05 0354) Weight:  [246 lb (111.585 kg)] 246 lb (111.585 kg) (11/05 0354)  Pre op weight 112 kg Current Weight  01/23/12 246 lb (111.585 kg)     Intake/Output from previous day: 11/04 0701 - 11/05 0700 In: 960 [P.O.:960] Out: 200 [Urine:200]   Physical Exam:  Cardiovascular: RRR Pulmonary: Slightly diminished at bases;expiratory wheezes  Abdomen: Soft, non tender, bowel sounds present. Extremities: Mild bilateral lower extremity edema. Wounds: Clean and dry.  No erythema or signs of infection.  Lab Results: CBC: Basename 01/23/12 0450 01/21/12 0615  WBC 6.1 6.4  HGB 10.1* 9.5*  HCT 29.5* 28.7*  PLT 171 134*   BMET:  Basename 01/23/12 0450 01/21/12 0615  NA 135 133*  K 3.6 3.7  CL 101 98  CO2 25 28  GLUCOSE 127* 113*  BUN 20 20  CREATININE 0.98 0.97  CALCIUM 8.8 8.7    PT/INR:  Lab Results  Component Value Date   INR 1.39 01/18/2012   INR 1.21 01/18/2012   INR 1.32 04/17/2010   ABG:  INR: Will add last result for INR, ABG once components are confirmed Will add last 4 CBG results once components are confirmed  Assessment/Plan:  1. CV - Previous AF with RVR and SR with previous PVCs. Maintaining SR. Continue Amiodarone 200 daily, Lopressor 12.5 bid, and Cozaar 25 daily.Will increase Cozaar to pre op dose of 50 daily for better bp control. 2.  Pulmonary  - Encourage incentive spirometer. CXR this am is stable (cardiomegaly, small bilateral pleural effusions and atelectasis, and mild vascular congestion).  3. Volume Overload - Continue with diuresis. Will give Lasix IV again this am 4.  Acute blood loss anemia - H and H this am 10.1and 29.5. 5.Supplement potassium 6.DM-CBGs 129/131/120.Pre op HGA1C 6.2. On Metformin. Will need follow up as an outpatient. 7.Remove EPW 8.Thrombocytopenia resolved as platelets no 171,000. 9.Probable discharge in am     ZIMMERMAN,DONIELLE MPA-C 01/23/2012,7:42 AM patient examined and medical record reviewed,agree with above note Maintaining sinus rhythm, blood sugars well-controlled on oral metformin. Will DC pacing wires today VAN TRIGT III,Sayda Grable 01/23/2012

## 2012-01-24 LAB — GLUCOSE, CAPILLARY
Glucose-Capillary: 126 mg/dL — ABNORMAL HIGH (ref 70–99)
Glucose-Capillary: 96 mg/dL (ref 70–99)
Glucose-Capillary: 127 mg/dL — ABNORMAL HIGH (ref 70–99)
Glucose-Capillary: 114 mg/dL — ABNORMAL HIGH (ref 70–99)

## 2012-01-24 MED ORDER — WARFARIN SODIUM 5 MG PO TABS
5.0000 mg | ORAL_TABLET | Freq: Every day | ORAL | Status: DC
  Administered 2012-01-24 – 2012-01-26 (×3): 5 mg via ORAL
  Filled 2012-01-24 (×4): qty 1

## 2012-01-24 MED ORDER — DILTIAZEM LOAD VIA INFUSION
10.0000 mg | Freq: Once | INTRAVENOUS | Status: DC
  Filled 2012-01-24: qty 10

## 2012-01-24 MED ORDER — AMIODARONE HCL 200 MG PO TABS
200.0000 mg | ORAL_TABLET | Freq: Two times a day (BID) | ORAL | Status: DC
  Filled 2012-01-24: qty 1

## 2012-01-24 MED ORDER — DILTIAZEM LOAD VIA INFUSION
10.0000 mg | Freq: Once | INTRAVENOUS | Status: DC
  Administered 2012-01-24: 10 mg via INTRAVENOUS
  Filled 2012-01-24: qty 10

## 2012-01-24 MED ORDER — AMIODARONE IV BOLUS ONLY 150 MG/100ML: 150.0000 mg | Freq: Once | INTRAVENOUS | Status: DC

## 2012-01-24 MED ORDER — WARFARIN - PHYSICIAN DOSING INPATIENT
Freq: Every day | Status: DC
  Administered 2012-01-24 – 2012-01-25 (×2)

## 2012-01-24 MED ORDER — METOPROLOL TARTRATE 25 MG PO TABS
25.0000 mg | ORAL_TABLET | Freq: Two times a day (BID) | ORAL | Status: DC
  Administered 2012-01-24: 25 mg via ORAL
  Filled 2012-01-24 (×2): qty 1

## 2012-01-24 MED ORDER — POTASSIUM CHLORIDE CRYS ER 20 MEQ PO TBCR
20.0000 meq | EXTENDED_RELEASE_TABLET | Freq: Two times a day (BID) | ORAL | Status: DC
  Administered 2012-01-24 – 2012-01-26 (×6): 20 meq via ORAL
  Filled 2012-01-24 (×7): qty 1

## 2012-01-24 MED ORDER — DEXTROSE 5 % IV SOLN
5.0000 mg/h | INTRAVENOUS | Status: DC
  Administered 2012-01-24: 10 mg/h via INTRAVENOUS
  Filled 2012-01-24 (×3): qty 100

## 2012-01-24 MED ORDER — METOPROLOL TARTRATE 25 MG PO TABS
25.0000 mg | ORAL_TABLET | Freq: Three times a day (TID) | ORAL | Status: DC
  Administered 2012-01-24 – 2012-01-26 (×8): 25 mg via ORAL
  Filled 2012-01-24 (×11): qty 1

## 2012-01-24 MED ORDER — DILTIAZEM HCL 100 MG IV SOLR
10.0000 mg/h | INTRAVENOUS | Status: DC
  Administered 2012-01-24: 10 mg/h via INTRAVENOUS
  Filled 2012-01-24: qty 100

## 2012-01-24 MED ORDER — AMIODARONE HCL 200 MG PO TABS
400.0000 mg | ORAL_TABLET | Freq: Two times a day (BID) | ORAL | Status: DC
  Administered 2012-01-24 – 2012-01-26 (×5): 400 mg via ORAL
  Filled 2012-01-24 (×7): qty 2

## 2012-01-24 NOTE — Progress Notes (Signed)
CTS d/c per policy and protocol, steris applied. Will continue to monitor.

## 2012-01-24 NOTE — Progress Notes (Signed)
1425 Came to walk with pt. Heart rate going up just going to bathroom. Talked with pt's RN. She will try to walk pt later. Pt does not want to try right now. Maysin Carstens DunlapRN

## 2012-01-24 NOTE — Progress Notes (Signed)
Pt HR below 100, but pt prefers to wait to ambulate until tonight, he states wanting to make sure that HR stays below 100 before getting up.  Will pass along to night shift.  Will continue to monitor.

## 2012-01-24 NOTE — Progress Notes (Signed)
[facility]  Subjective: Feels anxious with the Afib.  Objective: Vital signs in last 24 hours: Temp:  [98 F (36.7 C)-98.5 F (36.9 C)] 98.5 F (36.9 C) (11/06 0414) Pulse Rate:  [86-96] 93  (11/05 2047) Resp:  [18] 18  (11/05 2047) BP: (133-161)/(68-90) 138/81 mmHg (11/06 0639) SpO2:  [92 %-95 %] 94 % (11/06 0414) Weight:  [109.816 kg (242 lb 1.6 oz)] 109.816 kg (242 lb 1.6 oz) (11/06 0414) Last BM Date: 01/23/12  Intake/Output from previous day: 11/05 0701 - 11/06 0700 In: 840 [P.O.:840] Out: -  Intake/Output this shift:    Medications Current Facility-Administered Medications  Medication Dose Route Frequency Provider Last Rate Last Dose  . 0.9 %  sodium chloride infusion  250 mL Intravenous PRN Delight Ovens, MD      . allopurinol (ZYLOPRIM) tablet 300 mg  300 mg Oral Daily Kerin Perna, MD   300 mg at 01/23/12 1047  . amiodarone (PACERONE) tablet 200 mg  200 mg Oral BID Kerin Perna, MD      . aspirin EC tablet 325 mg  325 mg Oral Daily Delight Ovens, MD   325 mg at 01/23/12 1047  . atorvastatin (LIPITOR) tablet 40 mg  40 mg Oral q1800 Marykay Lex, MD   40 mg at 01/23/12 1724  . bisacodyl (DULCOLAX) EC tablet 10 mg  10 mg Oral Daily PRN Delight Ovens, MD   10 mg at 01/20/12 1019   Or  . bisacodyl (DULCOLAX) suppository 10 mg  10 mg Rectal Daily PRN Delight Ovens, MD      . calcium carbonate (TUMS - dosed in mg elemental calcium) chewable tablet 600 mg of elemental calcium  3 tablet Oral Daily Kerin Perna, MD   600 mg of elemental calcium at 01/23/12 1048  . desmopressin (DDAVP) tablet 0.2 mg  0.2 mg Oral QHS Delight Ovens, MD      . diltiazem (CARDIZEM) 100 mg in dextrose 5 % 100 mL infusion  5 mg/hr Intravenous Titrated Kerin Perna, MD       And  . diltiazem (CARDIZEM) 1 mg/mL load via infusion 10 mg  10 mg Intravenous Once Kerin Perna, MD      . diphenoxylate-atropine (LOMOTIL) 2.5-0.025 MG per  tablet 1 tablet  1 tablet Oral QID PRN Lowella Dandy, PA   3 tablet at 01/23/12 0718  . docusate sodium (COLACE) capsule 200 mg  200 mg Oral Daily Delight Ovens, MD   200 mg at 01/22/12 0936  . insulin aspart (novoLOG) injection 0-24 Units  0-24 Units Subcutaneous TID AC & HS Delight Ovens, MD   2 Units at 01/24/12 [phone]  . losartan (COZAAR) tablet 50 mg  50 mg Oral Daily Ardelle Balls, PA   50 mg at 01/23/12 1047  . magnesium hydroxide (MILK OF MAGNESIA) suspension 30 mL  30 mL Oral Daily PRN Delight Ovens, MD      . metFORMIN (GLUCOPHAGE) tablet 500 mg  500 mg Oral BID WC Kerin Perna, MD   500 mg at 01/24/12 [phone]  . metoprolol tartrate (LOPRESSOR) tablet 25 mg  25 mg Oral BID Erin Barrett, PA      . multivitamin with minerals tablet 1 tablet  1 tablet Oral Daily Delight Ovens, MD   1 tablet at 01/23/12 1000  . nortriptyline (PAMELOR) capsule 10 mg  10 mg Oral QHS Delight Ovens, MD  10 mg at 01/23/12 2139  . ondansetron (ZOFRAN) tablet 4 mg  4 mg Oral Q6H PRN Delight Ovens, MD       Or  . ondansetron [facility]) injection 4 mg  4 mg Intravenous Q6H PRN Delight Ovens, MD      . oxyCODONE (Oxy IR/ROXICODONE) immediate release tablet 5-10 mg  5-10 mg Oral Q3H PRN Delight Ovens, MD   10 mg at 01/24/12 0355  . pantoprazole (PROTONIX) EC tablet 40 mg  40 mg Oral Q1200 Delight Ovens, MD   40 mg at 01/23/12 1357  . phenol (CHLORASEPTIC) mouth spray 1 spray  1 spray Mouth/Throat PRN Wilmon Pali, PA   1 spray at 01/21/12 0938  . potassium chloride SA (K-DUR,KLOR-CON) CR tablet 20 mEq  20 mEq Oral Daily Ardelle Balls, PA   20 mEq at 01/23/12 1000  . potassium chloride SA (K-DUR,KLOR-CON) CR tablet 20 mEq  20 mEq Oral BID Kerin Perna, MD      . sodium chloride 0.9 % injection 3 mL  3 mL Intravenous Q12H Delight Ovens, MD   3 mL at 01/23/12 2139  . sodium chloride 0.9 % injection 3 mL  3 mL Intravenous PRN Delight Ovens, MD      . warfarin  (COUMADIN) tablet 5 mg  5 mg Oral q1800 Kerin Perna, MD      . Warfarin - Physician Dosing Inpatient   Does not apply q1800 Arman Filter, [facility]      . [DISCONTINUED] amiodarone (PACERONE) tablet 200 mg  200 mg Oral Daily Kerin Perna, MD   200 mg at 01/23/12 1047  . [DISCONTINUED] diltiazem (CARDIZEM) 1 mg/mL load via infusion 10 mg  10 mg Intravenous Once Loreli Slot, MD      . [DISCONTINUED] diltiazem (CARDIZEM) 100 mg in dextrose 5 % 100 mL infusion  10 mg/hr Intravenous Titrated Loreli Slot, MD 10 mL/hr at 01/24/12 0550 10 mg/hr at 01/24/12 0550  . [DISCONTINUED] metoprolol tartrate (LOPRESSOR) tablet 12.5 mg  12.5 mg Oral BID Kerin Perna, MD   12.5 mg at 01/23/12 2139    PE: General appearance: alert, cooperative and no distress Lungs: clear to auscultation bilaterally Heart: irregularly irregular rhythm Extremities: 1+ Right LEE Pulses: Radials 2+ and symmetric Neurologic: Grossly normal  Lab Results:   [facility] 01/23/12 0450  WBC 6.1  HGB 10.1*  HCT 29.5*  PLT 171   BMET  Basename 01/23/12 0450  NA 135  K 3.6  CL 101  CO2 25  GLUCOSE 127*  BUN 20  CREATININE 0.98  CALCIUM 8.8    Assessment/Plan    Principal Problem:  *S/P CABG x 4, elective, 01/18/12 Active Problems:  Hypertension  Hyperlipidemia  CAD -- mLAD 80&-60%, OM2 90%, OM3 70-80%, RPDA 90%, cath 01/11/12  Atrial fibrillation with rapid ventricular response; New onset, post-op CABG  Plan:  Afib RVR.  Rate now 100-115 on 5mg /hr cardizem.  Amiodarone increased to 200mg  BID.  He may need a higher loading dose for a week.  Otherwise stable.  Was supposed to go home today.   LOS: 6 days    HAGER, BRYAN 01/24/2012 11:15 AM   Patient seen and examined. Agree with assessment and plan.Pt becomes anxio0us with increased AF rate.  Cardizem drip just increased by Dr PVT to 10 mg/hr.  Will increase lopressor to every 8 hours if BP allows.   Lennette Bihari, MD,  [facility] 01/24/2012 1:25  PM

## 2012-01-24 NOTE — Progress Notes (Signed)
No ambulation this morning secondary to uncontrolled HR and AFib.  Will try again this afternoon.  Will continue to monitor patient

## 2012-01-24 NOTE — Progress Notes (Addendum)
6 Days Post-Op Procedure(s) (LRB): CORONARY ARTERY BYPASS GRAFTING (CABG) (N/A) ENDOVEIN HARVEST OF GREATER SAPHENOUS VEIN (Right) Subjective:  Mr. Lucas Morales complains of shortness of breath and palpitations this morning.  He developed A. Fib with RVR this morning.  Objective: Vital signs in last 24 hours: Temp:  [98 F (36.7 C)-98.5 F (36.9 C)] 98.5 F (36.9 C) (11/06 0414) Pulse Rate:  [86-96] 93  (11/05 2047) Cardiac Rhythm:  [-] Atrial fibrillation (11/06 0500) Resp:  [18] 18  (11/05 2047) BP: (133-161)/(68-90) 138/81 mmHg (11/06 0639) SpO2:  [92 %-95 %] 94 % (11/06 0414) Weight:  [242 lb 1.6 oz (109.816 kg)] 242 lb 1.6 oz (109.816 kg) (11/06 0414)  Intake/Output from previous day: 11/05 0701 - 11/06 0700 In: 840 [P.O.:840] Out: -   General appearance: alert, cooperative and no distress Heart: irregularly irregular rhythm Lungs: clear to auscultation bilaterally Abdomen: soft, non-tender; bowel sounds normal; no masses,  no organomegaly Extremities: edema trace Wound: clean and dry  Lab Results:  [facility] 01/23/12 0450  WBC 6.1  HGB 10.1*  HCT 29.5*  PLT 171   BMET:  Basename 01/23/12 0450  NA 135  K 3.6  CL 101  CO2 25  GLUCOSE 127*  BUN 20  CREATININE 0.98  CALCIUM 8.8    PT/INR: No results found for this basename: LABPROT,INR in the last 72 hours ABG    Component Value Date/Time   PHART 7.359 01/18/2012 2100   HCO3 21.6 01/18/2012 2100   TCO2 23 01/19/2012 1711   ACIDBASEDEF 3.0* 01/18/2012 2100   O2SAT 94.0 01/18/2012 2100   CBG (last 3)   Basename 01/24/12 0547 01/23/12 2049 01/23/12 1647  GLUCAP 126* 118* 137*    Assessment/Plan: S/P Procedure(s) (LRB): CORONARY ARTERY BYPASS GRAFTING (CABG) (N/A) ENDOVEIN HARVEST OF GREATER SAPHENOUS VEIN (Right)  1. CV- Atrial Fibrillation- started on Cardizem drip this morning, will increase Lopressor to 25mg  BID also on daily Amiodarone 2. Pulm- no acute issues, continue IS 3. Volume Overload-  on Lasix 4. DM- CBGs controlled, continue metformin 5. Dispo- patient with A. Fib with RVR this morning, currently on Cardizem drip, will follow   LOS: 6 days    BARRETT, ERIN 01/24/2012   patient examined and medical record reviewed,agree with above note.  Plan to increase amio and lopressor doses, start coumadin slowly, keep in hospital VAN TRIGT III,Acsa Estey 01/24/2012

## 2012-01-24 NOTE — Progress Notes (Signed)
Received call from monitor tech for HR up to 150. Pt resting on the recliner and denied any chest pain. EKG showed A fib with RVR. BP 145/74 and HR range from 110s to 150s. Called Dr. Dorris Fetch to aware. Received order to start Cardizem 10mg  bolus and continuous gtt. Will continue to monitor.

## 2012-01-25 LAB — GLUCOSE, CAPILLARY
Glucose-Capillary: 129 mg/dL — ABNORMAL HIGH (ref 70–99)
Glucose-Capillary: 130 mg/dL — ABNORMAL HIGH (ref 70–99)
Glucose-Capillary: 144 mg/dL — ABNORMAL HIGH (ref 70–99)
Glucose-Capillary: 126 mg/dL — ABNORMAL HIGH (ref 70–99)

## 2012-01-25 LAB — PROTIME-INR
INR: 1.15 (ref 0.00–1.49)
Prothrombin Time: 14.5 seconds (ref 11.6–15.2)

## 2012-01-25 MED ORDER — DIGOXIN 250 MCG PO TABS
0.2500 mg | ORAL_TABLET | Freq: Every day | ORAL | Status: DC
  Administered 2012-01-25 – 2012-01-26 (×2): 0.25 mg via ORAL
  Filled 2012-01-25 (×4): qty 1

## 2012-01-25 MED ORDER — LOPERAMIDE HCL 2 MG PO CAPS
6.0000 mg | ORAL_CAPSULE | Freq: Every day | ORAL | Status: DC
  Administered 2012-01-25 – 2012-01-26 (×2): 6 mg via ORAL
  Filled 2012-01-25 (×3): qty 3

## 2012-01-25 MED ORDER — TRAMADOL HCL 50 MG PO TABS: 50.0000 mg | ORAL_TABLET | Freq: Four times a day (QID) | ORAL | Status: DC | PRN

## 2012-01-25 MED ORDER — OFF THE BEAT BOOK
Freq: Once | Status: AC
  Administered 2012-01-26: 11:00:00
  Filled 2012-01-25: qty 1

## 2012-01-25 NOTE — Progress Notes (Signed)
Patient ambulated this evening, approximately 550 feet, with walker and RN and tolerated well.  Steady on his feet.  HR did rise to 120 during walk but did not sustain.  Patient feels well, left resting in chair with call bell within reach.  Will continue to monitor.  Arva Chafe

## 2012-01-25 NOTE — Progress Notes (Signed)
CARDIAC REHAB PHASE I   PRE:  Rate/Rhythm: 103afib  BP:  Supine:   Sitting: 116/68  Standing:    SaO2: 93%RA  MODE:  Ambulation: 490 ft   POST:  Rate/Rhythem: 140afib during walk. 105 with rest  BP:  Supine:   Sitting: 137/58  Standing:    SaO2: 96%RA 445 121 1304 Pt assisted to bathroom and then walked 490 ft with rolling walker and asst x 1. Some SOB noted with increased HR. C/o right ankle pain. Stated he has had gout before and this did not feel like gout. To recliner after walk. HR decreased with rest.  Duanne Limerick

## 2012-01-25 NOTE — Progress Notes (Signed)
[facility]  Subjective: No dizziness or SOB; feels better today.  Objective: Vital signs in last 24 hours: Temp:  [97.4 F (36.3 C)-98.5 F (36.9 C)] 97.4 F (36.3 C) (11/07 0433) Pulse Rate:  [87-110] 87  (11/07 1125) Resp:  [18-20] 19  (11/07 0433) BP: (122-143)/(54-77) 122/54 mmHg (11/07 1055) SpO2:  [95 %-96 %] 96 % (11/07 0433) Weight:  [109.498 kg (241 lb 6.4 oz)] 109.498 kg (241 lb 6.4 oz) (11/07 0433) Last BM Date: 01/25/12  Intake/Output from previous day: 11/06 0701 - 11/07 0700 In: 1384 [P.O.:840; I.V.:544] Out: -  Intake/Output this shift:    Medications Current Facility-Administered Medications  Medication Dose Route Frequency Provider Last Rate Last Dose  . 0.9 %  sodium chloride infusion  250 mL Intravenous PRN Delight Ovens, MD      . allopurinol (ZYLOPRIM) tablet 300 mg  300 mg Oral Daily Kerin Perna, MD   300 mg at 01/25/12 1049  . amiodarone (PACERONE) tablet 400 mg  400 mg Oral BID Wilburt Finlay, PA   400 mg at 01/25/12 1049  . aspirin EC tablet 325 mg  325 mg Oral Daily Delight Ovens, MD   325 mg at 01/25/12 1049  . atorvastatin (LIPITOR) tablet 40 mg  40 mg Oral q1800 Marykay Lex, MD   40 mg at 01/24/12 1709  . bisacodyl (DULCOLAX) EC tablet 10 mg  10 mg Oral Daily PRN Delight Ovens, MD   10 mg at 01/20/12 1019   Or  . bisacodyl (DULCOLAX) suppository 10 mg  10 mg Rectal Daily PRN Delight Ovens, MD      . calcium carbonate (TUMS - dosed in mg elemental calcium) chewable tablet 600 mg of elemental calcium  3 tablet Oral Daily Kerin Perna, MD   600 mg of elemental calcium at 01/25/12 1049  . digoxin (LANOXIN) tablet 0.25 mg  0.25 mg Oral Daily Wilmon Pali, PA   0.25 mg at 01/25/12 1125  . diltiazem (CARDIZEM) 100 mg in dextrose 5 % 100 mL infusion  5 mg/hr Intravenous Titrated Kerin Perna, MD 10 mL/hr at 01/24/12 1421 10 mg/hr at 01/24/12 1421   And  . [COMPLETED] diltiazem (CARDIZEM) 1 mg/mL  load via infusion 10 mg  10 mg Intravenous Once Kerin Perna, MD   10 mg at 01/24/12 1323  . docusate sodium (COLACE) capsule 200 mg  200 mg Oral Daily Delight Ovens, MD   200 mg at 01/24/12 1117  . insulin aspart (novoLOG) injection 0-24 Units  0-24 Units Subcutaneous TID AC & HS Delight Ovens, MD   2 Units at 01/25/12 1125  . loperamide (IMODIUM) capsule 6 mg  6 mg Oral Daily Wilmon Pali, PA   6 mg at 01/25/12 1053  . losartan (COZAAR) tablet 50 mg  50 mg Oral Daily Ardelle Balls, PA   50 mg at 01/25/12 1049  . magnesium hydroxide (MILK OF MAGNESIA) suspension 30 mL  30 mL Oral Daily PRN Delight Ovens, MD      . metFORMIN (GLUCOPHAGE) tablet 500 mg  500 mg Oral BID WC Kerin Perna, MD   500 mg at 01/25/12 0752  . metoprolol tartrate (LOPRESSOR) tablet 25 mg  25 mg Oral TID Lennette Bihari, MD   25 mg at 01/25/12 1055  . multivitamin with minerals tablet 1 tablet  1 tablet Oral Daily Delight Ovens, MD   1 tablet  at 01/25/12 1050  . nortriptyline (PAMELOR) capsule 10 mg  10 mg Oral QHS Delight Ovens, MD   10 mg at 01/24/12 2225  . ondansetron (ZOFRAN) tablet 4 mg  4 mg Oral Q6H PRN Delight Ovens, MD       Or  . ondansetron [facility]) injection 4 mg  4 mg Intravenous Q6H PRN Delight Ovens, MD      . oxyCODONE (Oxy IR/ROXICODONE) immediate release tablet 5-10 mg  5-10 mg Oral Q3H PRN Delight Ovens, MD   10 mg at 01/25/12 0537  . pantoprazole (PROTONIX) EC tablet 40 mg  40 mg Oral Q1200 Delight Ovens, MD   40 mg at 01/25/12 1124  . phenol (CHLORASEPTIC) mouth spray 1 spray  1 spray Mouth/Throat PRN Wilmon Pali, PA   1 spray at 01/21/12 0938  . potassium chloride SA (K-DUR,KLOR-CON) CR tablet 20 mEq  20 mEq Oral Daily Ardelle Balls, PA   20 mEq at 01/24/12 1132  . potassium chloride SA (K-DUR,KLOR-CON) CR tablet 20 mEq  20 mEq Oral BID Kerin Perna, MD   20 mEq at 01/25/12 1049  . sodium chloride 0.9 % injection 3 mL  3 mL Intravenous Q12H  Delight Ovens, MD   3 mL at 01/24/12 1120  . sodium chloride 0.9 % injection 3 mL  3 mL Intravenous PRN Delight Ovens, MD      . traMADol Janean Sark) tablet 50-100 mg  50-100 mg Oral Q6H PRN Wilmon Pali, PA      . warfarin (COUMADIN) tablet 5 mg  5 mg Oral q1800 Kerin Perna, MD   5 mg at 01/24/12 1709  . Warfarin - Physician Dosing Inpatient   Does not apply q1800 Arman Filter, [facility]      . [DISCONTINUED] amiodarone (NEXTERONE) IV bolus only 150 mg/100 mL  150 mg Intravenous Once Wilburt Finlay, PA      . [DISCONTINUED] amiodarone (PACERONE) tablet 200 mg  200 mg Oral BID Kerin Perna, MD      . [DISCONTINUED] desmopressin (DDAVP) tablet 0.2 mg  0.2 mg Oral QHS Delight Ovens, MD      . [DISCONTINUED] diphenoxylate-atropine (LOMOTIL) 2.5-0.025 MG per tablet 1 tablet  1 tablet Oral QID PRN Lowella Dandy, PA   1 tablet at 01/24/12 1925  . [DISCONTINUED] metoprolol tartrate (LOPRESSOR) tablet 25 mg  25 mg Oral BID Lowella Dandy, PA   25 mg at 01/24/12 1119    PE: General appearance: alert, cooperative and no distress  Lungs: clear to auscultation bilaterally  Heart: irregularly irregular rhythm  Extremities: 1+ Right LEE  Pulses: Radials 2+ and symmetric  Neurologic: Grossly normal   Lab Results:   [facility] 01/23/12 0450  WBC 6.1  HGB 10.1*  HCT 29.5*  PLT 171   BMET  Basename 01/23/12 0450  NA 135  K 3.6  CL 101  CO2 25  GLUCOSE 127*  BUN 20  CREATININE 0.98  CALCIUM 8.8   PT/INR  Basename 01/25/12 0550  LABPROT 14.5  INR 1.15   Assessment/Plan   Principal Problem:  *S/P CABG x 4, elective, 01/18/12 Active Problems:  Hypertension  Hyperlipidemia  CAD -- mLAD 80&-60%, OM2 90%, OM3 70-80%, RPDA 90%, cath 01/11/12  Atrial fibrillation with rapid ventricular response; New onset, post-op CABG   Plan:  Maintaining Afib now with a controlled rate.  BP stable.   ASA, Amiodarone 400mg  BID, lopressor, coumadin, IV cardizem.  LOS: 7 days     HAGER, BRYAN 01/25/2012 12:03 PM   Patient seen and examined. Agree with assessment and plan. AF rate better controlled with increased Amiodarone and beta blocker therapy.  Getting coumadin. Cardiac Rehab.  Continue diuresis.   Lennette Bihari, MD, [facility] 01/25/2012 1:02 PM

## 2012-01-25 NOTE — Progress Notes (Signed)
                    [address]            [place] [address]          [phone]     7 Days Post-Op Procedure(s) (LRB): CORONARY ARTERY BYPASS GRAFTING (CABG) (N/A) ENDOVEIN HARVEST OF GREATER SAPHENOUS VEIN (Right)  Subjective: Still having elevated HRs with exertion.  Pt anxious about rhythm, as well as medications.    Objective: Vital signs in last 24 hours: Patient Vitals for the past 24 hrs:  BP Temp Temp src Pulse Resp SpO2 Weight  01/25/12 0433 131/69 mmHg 97.4 F (36.3 C) Oral 88  19  96 % 241 lb 6.4 oz (109.498 kg)  01/24/12 1900 122/77 mmHg 98.4 F (36.9 C) Oral 94  20  95 % -  01/24/12 1613 143/73 mmHg - - 102  - - -  01/24/12 1352 123/59 mmHg 98.5 F (36.9 C) Oral 110  18  95 % -  01/24/12 1119 139/80 mmHg - - 110  - - -   Current Weight  01/25/12 241 lb 6.4 oz (109.498 kg)   PRE-OPERATIVE WEIGHT: 112 kg   Intake/Output from previous day: 11/06 0701 - 11/07 0700 In: 1384 [P.O.:840; I.V.:544] Out: -  CBGs 127-114-129   PHYSICAL EXAM:  Heart: Irr Irr Lungs: Clear Wound: Clean and dry Extremities: Trace LE edema    Lab Results: CBC: Basename 01/23/12 0450  WBC 6.1  HGB 10.1*  HCT 29.5*  PLT 171   BMET:  Basename 01/23/12 0450  NA 135  K 3.6  CL 101  CO2 25  GLUCOSE 127*  BUN 20  CREATININE 0.98  CALCIUM 8.8    PT/INR:  Basename 01/25/12 0550  LABPROT 14.5  INR 1.15      Assessment/Plan: S/P Procedure(s) (LRB): CORONARY ARTERY BYPASS GRAFTING (CABG) (N/A) ENDOVEIN HARVEST OF GREATER SAPHENOUS VEIN (Right)  CV- AF, rates variable. Continue Amio, Coumadin, Lopressor, Cardizem until rates better controlled.  Vol overload- diurese.  DM- sugars stable.  Continue po meds.  GI- pt has a h/o chronic GI issues causing diarrhea, and has requested restart of scheduled Imodium as prescribed by Dr. Juanda Chance. Will reorder.  Pt states he is not on DDAVP as noted on his home med rec, so will d/c.  Pain control- pt  refusing narcotics, but still having pain. Will order Ultram and monitor.   LOS: 7 days    Tirrell Buchberger H 01/25/2012

## 2012-01-26 LAB — GLUCOSE, CAPILLARY
Glucose-Capillary: 107 mg/dL — ABNORMAL HIGH (ref 70–99)
Glucose-Capillary: 90 mg/dL (ref 70–99)
Glucose-Capillary: 102 mg/dL — ABNORMAL HIGH (ref 70–99)
Glucose-Capillary: 94 mg/dL (ref 70–99)

## 2012-01-26 LAB — PROTIME-INR
INR: 1.2 (ref 0.00–1.49)
Prothrombin Time: 15 seconds (ref 11.6–15.2)

## 2012-01-26 MED ORDER — WARFARIN SODIUM 5 MG PO TABS: 5.0000 mg | ORAL_TABLET | Freq: Every day | ORAL | Status: DC

## 2012-01-26 MED ORDER — WARFARIN VIDEO
Freq: Once | Status: AC
  Administered 2012-01-26: 20:00:00

## 2012-01-26 MED ORDER — LOSARTAN POTASSIUM 50 MG PO TABS: 50.0000 mg | ORAL_TABLET | Freq: Every day | ORAL | Status: DC

## 2012-01-26 MED ORDER — OXYCODONE HCL 5 MG PO TABS: 5.0000 mg | ORAL_TABLET | ORAL | Status: DC | PRN

## 2012-01-26 MED ORDER — PATIENT'S GUIDE TO USING COUMADIN BOOK
Freq: Once | Status: AC
  Administered 2012-01-26: 18:00:00
  Filled 2012-01-26: qty 1

## 2012-01-26 MED ORDER — METOPROLOL TARTRATE 25 MG PO TABS: 25.0000 mg | ORAL_TABLET | Freq: Two times a day (BID) | ORAL | Status: DC

## 2012-01-26 MED ORDER — AMIODARONE HCL 400 MG PO TABS: 400.0000 mg | ORAL_TABLET | Freq: Two times a day (BID) | ORAL | Status: DC

## 2012-01-26 MED ORDER — METFORMIN HCL 500 MG PO TABS: 500.0000 mg | ORAL_TABLET | Freq: Two times a day (BID) | ORAL | Status: DC

## 2012-01-26 MED ORDER — ATORVASTATIN CALCIUM 40 MG PO TABS: 40.0000 mg | ORAL_TABLET | Freq: Every day | ORAL | Status: DC

## 2012-01-26 MED ORDER — DIGOXIN 250 MCG PO TABS: 0.2500 mg | ORAL_TABLET | Freq: Every day | ORAL | Status: DC

## 2012-01-26 NOTE — Progress Notes (Addendum)
                   [address]            [place] [address]          [phone]      8 Days Post-Op Procedure(s) (LRB): CORONARY ARTERY BYPASS GRAFTING (CABG) (N/A) ENDOVEIN HARVEST OF GREATER SAPHENOUS VEIN (Right)  Subjective: Patient has no complaints this am. He hopes to go home soon.  Objective: Vital signs in last 24 hours: Temp:  [97.7 F (36.5 C)-98.1 F (36.7 C)] 98.1 F (36.7 C) (11/08 0601) Pulse Rate:  [79-97] 81  (11/08 0601) Cardiac Rhythm:  [-] Normal sinus rhythm (11/08 0030) Resp:  [17-18] 17  (11/08 0601) BP: (122-131)/(54-80) 131/80 mmHg (11/08 0601) SpO2:  [96 %] 96 % (11/08 0601) Weight:  [241 lb 9.6 oz (109.589 kg)] 241 lb 9.6 oz (109.589 kg) (11/08 0439)  Pre op weight 112 kg Current Weight  01/26/12 241 lb 9.6 oz (109.589 kg)     Intake/Output from previous day: 11/07 0701 - 11/08 0700 In: 3 [I.V.:3] Out: -    Physical Exam:  Cardiovascular: RRR Pulmonary: Slightly diminished at bases;expiratory wheezes  Abdomen: Soft, non tender, bowel sounds present. Extremities: Trace bilateral lower extremity edema. Wounds: Clean and dry.  No erythema or signs of infection.  Lab Results: CBC:No results found for this basename: WBC:2,HGB:2,HCT:2,PLT:2 in the last 72 hours BMET: No results found for this basename: NA:2,K:2,CL:2,CO2:2,GLUCOSE:2,BUN:2,CREATININE:2,CALCIUM:2 in the last 72 hours  PT/INR:  Lab Results  Component Value Date   INR 1.20 01/26/2012   INR 1.15 01/25/2012   INR 1.39 01/18/2012   ABG:  INR: Will add last result for INR, ABG once components are confirmed Will add last 4 CBG results once components are confirmed  Assessment/Plan:  1. CV - Previous AF with RVR. Converted to SR around 8:16 last evening. Continue Amiodarone 400 bid, Lopressor 25 bid, Digoxin 0.25 daily, Cozaar 50 daily, and Coumadin. 2.  Pulmonary - Encourage incentive spirometer.   3. Volume Overload - Has been diuresed and is below pre op  weight 4.  Acute blood loss anemia - H and H this am 10.1and 29.5. 5.DM-CBGs 144/126/107.Pre op HGA1C 6.2. On Metformin. Will need follow up as an outpatient. 6.Likely observe rhythm for 24 hours and discharge in am  ZIMMERMAN,DONIELLE MPA-C 01/26/2012,8:11 AM     patient examined and medical record reviewed,agree with above note. VAN TRIGT III,Dulcy Sida 01/26/2012

## 2012-01-26 NOTE — Progress Notes (Signed)
CARDIAC REHAB PHASE I   PRE:  Rate/Rhythm: 84SR  BP:  Supine: 139/77  Sitting:   Standing:    SaO2: 94%RA  MODE:  Ambulation: 890 ft   POST:  Rate/Rhythem: 108ST  BP:  Supine:   Sitting: 149/73  Standing:    SaO2: 98%RA 1120-1200 Pt walked 890 ft with rolling walker with steady gait. States he has walker at home. Tolerated well. Remained in NSR. Education completed. Referring to [place] Phase 2.    Duanne Limerick

## 2012-01-26 NOTE — Progress Notes (Signed)
Received a call from monitor tech that the patient had converted to NSR.  It appears this occurred around 2010 last evening.  Will continue to monitor.  Arva Chafe

## 2012-01-26 NOTE — Progress Notes (Signed)
8 Days Post-Op Procedure(s) (LRB):  CORONARY ARTERY BYPASS GRAFTING (CABG) (N/A)  ENDOVEIN HARVEST OF GREATER SAPHENOUS VEIN   Subjective: Feeling better - just frustrated & wants to go home.  Objective: Vital signs in last 24 hours: Temp:  [97.7 F (36.5 C)-98.1 F (36.7 C)] 98.1 F (36.7 C) (11/08 0601) Pulse Rate:  [79-97] 81  (11/08 0601) Resp:  [17-18] 17  (11/08 0601) BP: (122-131)/(54-80) 131/80 mmHg (11/08 0601) SpO2:  [96 %] 96 % (11/08 0601) Weight:  [109.589 kg (241 lb 9.6 oz)] 109.589 kg (241 lb 9.6 oz) (11/08 0439) Weight change: 0.091 kg (3.2 oz) Last BM Date: 01/25/12 Intake/Output from previous day: +238 11/07 0701 - 11/08 0700 In: 3 [I.V.:3] Out: -  Intake/Output this shift:    PE: General appearance: alert, cooperative and no distress Neck: no carotid bruit and no JVD Lungs: clear to auscultation bilaterally, normal percussion bilaterally and non-labored. Heart: regular rate and rhythm, S1, S2 normal, no murmur, click, rub or gallop Abdomen: soft, non-tender; bowel sounds normal; no masses,  no organomegaly Extremities: extremities normal, atraumatic, no cyanosis or edema Neurologic: Grossly normal    Lab Results  Component Value Date   HGBA1C 6.2* 01/12/2012     Lab Results  Component Value Date   TSH 1.120 01/12/2012      Studies/Results: No results found.  Medications: I have reviewed the patient's current medications.    [name] allopurinol  300 mg Oral Daily  . amiodarone  400 mg Oral BID  . aspirin EC  325 mg Oral Daily  . atorvastatin  40 mg Oral q1800  . calcium carbonate  3 tablet Oral Daily  . digoxin  0.25 mg Oral Daily  . docusate sodium  200 mg Oral Daily  . insulin aspart  0-24 Units Subcutaneous TID AC & HS  . loperamide  6 mg Oral Daily  . losartan  50 mg Oral Daily  . metFORMIN  500 mg Oral BID WC  . metoprolol tartrate  25 mg Oral TID  . multivitamin with minerals  1 tablet Oral Daily  . nortriptyline  10 mg Oral QHS  .  off the beat book   Does not apply Once  . pantoprazole  40 mg Oral Q1200  . potassium chloride  20 mEq Oral Daily  . potassium chloride  20 mEq Oral BID  . sodium chloride  3 mL Intravenous Q12H  . warfarin  5 mg Oral q1800  . Warfarin - Physician Dosing Inpatient   Does not apply q1800   Assessment/Plan: Principal Problem:  *S/P CABG x 4, elective, 01/18/12 Active Problems:  Hypertension  Hyperlipidemia  CAD -- mLAD 80&-60%, OM2 90%, OM3 70-80%, RPDA 90%, cath 01/11/12  Atrial fibrillation with rapid ventricular response; New onset, post-op CABG  PLAN: converted to SR at 2016 last pm.  On amiodarone BID, lopressor 25 bid, dig, cozaar and coumadin.    Plan for d/c in am, f/u wih Dr. Allyson Sabal.  LOS: 8 days   INGOLD,LAURA R 01/26/2012, 9:17 AM  I seen and evaluated the patient this morning along with the PA/NP. I agree with their findings, examination as well as impression recommendations.  He looks much better -- back in NSR now on Amio & BB along with digoxin.  Anticoagulation with Coumadin started, should be ok without bridging.  I agree that he should be ready for d/c soon -- will need close INR follow-up as he is on Amiodarone.  Marykay Lex, M.D., M.S. [facility] [address] [place], [place]  [address]  [phone] Pager # [phone] 01/26/2012 4:39 PM

## 2012-01-26 NOTE — Progress Notes (Signed)
Pt ambulated 673ft with RW steady gait. Returned to chair, call bell within reach, will continue to monitor.

## 2012-01-26 NOTE — Progress Notes (Signed)
Patient declined ambulating a third time this evening, he had just ambulated around 1900.  Will continue to encourage.  Arva Chafe

## 2012-01-27 LAB — PROTIME-INR
INR: 1.13 (ref 0.00–1.49)
Prothrombin Time: 14.3 seconds (ref 11.6–15.2)

## 2012-01-27 LAB — GLUCOSE, CAPILLARY: Glucose-Capillary: 101 mg/dL — ABNORMAL HIGH (ref 70–99)

## 2012-01-27 MED ORDER — WARFARIN SODIUM 5 MG PO TABS: 5.0000 mg | ORAL_TABLET | Freq: Every day | ORAL | Status: DC

## 2012-01-27 MED ORDER — WARFARIN SODIUM 7.5 MG PO TABS
7.5000 mg | ORAL_TABLET | Freq: Once | ORAL | Status: AC
  Administered 2012-01-27: 7.5 mg via ORAL
  Filled 2012-01-27: qty 1

## 2012-01-27 NOTE — Progress Notes (Signed)
Patient viewed the Coumadin video this evening and has already been given his Coumadin book for education.    Lucas Morales

## 2012-01-27 NOTE — Progress Notes (Signed)
Discharged instructions reviewed, all questions answered, prescriptions given. Discharged home with wife.

## 2012-01-27 NOTE — Progress Notes (Signed)
                   [address]            [place] [address]          [phone]      9 Days Post-Op Procedure(s) (LRB): CORONARY ARTERY BYPASS GRAFTING (CABG) (N/A) ENDOVEIN HARVEST OF GREATER SAPHENOUS VEIN (Right)  Subjective: Patient has no complaints this am.   Objective: Vital signs in last 24 hours: Temp:  [97.4 F (36.3 C)-98.5 F (36.9 C)] 97.4 F (36.3 C) (11/09 0609) Pulse Rate:  [80-84] 83  (11/09 0609) Cardiac Rhythm:  [-] Normal sinus rhythm (11/08 2100) Resp:  [16-18] 18  (11/09 0609) BP: (121-151)/(61-83) 151/74 mmHg (11/09 0609) SpO2:  [92 %-96 %] 96 % (11/09 0609) Weight:  [238 lb 15.7 oz (108.4 kg)] 238 lb 15.7 oz (108.4 kg) (11/09 0609)  Pre op weight 112 kg Current Weight  01/27/12 238 lb 15.7 oz (108.4 kg)     Intake/Output from previous day: 11/08 0701 - 11/09 0700 In: 3 [I.V.:3] Out: -    Physical Exam:  Cardiovascular: RRR Pulmonary: Slightly diminished at bases;expiratory wheezes  Abdomen: Soft, non tender, bowel sounds present. Extremities: No  lower extremity edema. Wounds: Clean and dry.  No erythema or signs of infection.  Lab Results: CBC:No results found for this basename: WBC:2,HGB:2,HCT:2,PLT:2 in the last 72 hours BMET: No results found for this basename: NA:2,K:2,CL:2,CO2:2,GLUCOSE:2,BUN:2,CREATININE:2,CALCIUM:2 in the last 72 hours  PT/INR:  Lab Results  Component Value Date   INR 1.13 01/27/2012   INR 1.20 01/26/2012   INR 1.15 01/25/2012   ABG:  INR: Will add last result for INR, ABG once components are confirmed Will add last 4 CBG results once components are confirmed  Assessment/Plan:  1. CV - Previous AF with RVR. Converted to SR around 8:16 last evening. Continue Amiodarone 400 bid, Lopressor 25 bid, Digoxin 0.25 daily, Cozaar 50 daily, and Coumadin. 2.  Pulmonary - Encourage incentive spirometer.   3. Volume Overload - Has been diuresed and is below pre op weight 4.  Acute blood loss anemia  - H and H this am 10.1and 29.5. 5.DM-CBGs 102/94/101.Pre op HGA1C 6.2. On Metformin. Will need follow up as an outpatient. 6.Discharge today  Lucas Morales MPA-C 01/27/2012,7:29 AM

## 2012-02-05 NOTE — Discharge Summary (Signed)
patient examined and medical record reviewed,agree with above note. Lucas Morales,Lucas Morales 02/05/2012    

## 2012-02-14 ENCOUNTER — Ambulatory Visit
Admission: RE | Admit: 2012-02-14 | Discharge: 2012-02-14 | Disposition: A | Discharge status: Home / Self Care | Payer: Medicare Other | Source: Ambulatory Visit | Attending: Cardiothoracic Surgery | Admitting: Cardiothoracic Surgery

## 2012-02-14 ENCOUNTER — Encounter: Payer: Self-pay | Admitting: Cardiothoracic Surgery

## 2012-02-14 ENCOUNTER — Ambulatory Visit (INDEPENDENT_AMBULATORY_CARE_PROVIDER_SITE_OTHER): Payer: Self-pay | Admitting: Cardiothoracic Surgery

## 2012-02-14 ENCOUNTER — Other Ambulatory Visit: Payer: Self-pay | Admitting: Cardiothoracic Surgery

## 2012-02-14 VITALS — BP 126/75 | HR 94 | Resp 20 | Ht 75.0 in | Wt 220.0 lb

## 2012-02-14 DIAGNOSIS — Z951 Presence of aortocoronary bypass graft: Secondary | ICD-10-CM

## 2012-02-14 DIAGNOSIS — I2510 Atherosclerotic heart disease of native coronary artery without angina pectoris: Secondary | ICD-10-CM

## 2012-02-14 NOTE — Progress Notes (Signed)
PCP is [name], MD Referring Provider is Runell Gess, MD  Chief Complaint  Patient presents with  . Routine Post Op    f/u post cabg x 4 on 01/18/12    HPI: The patient returns for his first postoperative office visit after undergoing multivessel bypass grafting for severe three-vessel coronary disease. The patient had postoperative atrial fibrillation converted to sinus rhythm with amiodarone. He was started on Coumadin for 8 weeks. The patient is done well at home. He has no angina. He has no symptoms of CHF. The surgical incisions are healing well. He walks 25-30 minutes daily. He is anxious to be L. to get back in the swimming pool and to drive and increase his activity level. He is currently taking amiodarone 200 mg once a day after seeing his cardiologist. No bleeding problems noted from his Coumadin.  Past Medical History  Diagnosis Date  . Adenomatous colon polyp   . Hemorrhoids   . Diverticulosis   . Gastritis   . IC (interstitial cystitis)   . Esophageal stricture   . Diverticulitis   . Hyperlipidemia     doesn't require meds  . Anginal pain 01/11/2012  . Bronchitis     "I've had it a few times" (01/11/2012)  . Pre-diabetes     "check my sugars q 3 days" (01/11/2012)  . H/O hiatal hernia   . Arthritis     "knees" (01/11/2012)  . Gout   . Dysrhythmia     bradycardia in the 30's (01/11/2012)  . Coronary artery disease   . Hypertension     takes Losartan daily  . Diabetes mellitus without complication     "borderline"  . Shortness of breath 01/11/2012    pt states all of the time  . History of bronchitis     last time about 3-56yrs ago  . Joint pain   . Joint swelling   . Gout     takes ALlopurinol daily  . GERD (gastroesophageal reflux disease)     takes Pantoprazole daily  . History of colon polyps   . Urinary frequency   . Interstitial cystitis   . Depression     does't require any meds  . Nocturia   . Insomnia     takes Nortrypyline  nightly    Past Surgical History  Procedure Date  . Reconstruction medial collateral ligament elbow w/ tendon graft 1988 X 2    right  . Total knee arthroplasty ~ 2001; 2011    right; left  . Bladder lesion removed   . Exploratory laparotomy 2009    with right colectomy  . Tonsillectomy and adenoidectomy ~1943  . Appendectomy 2009  . Cholecystectomy 2009  . Knee arthroscopy ~ 2001; 2011    right; left  . Lumbar disc surgery ?2010    "cleaned out the stenosis" (01/11/2012)  . Cystoscopy with ureteroscopy 2008; 2009    "painted inside of bladder wall"  . Cataract extraction w/ intraocular lens  implant, bilateral 2011  . Tumor excision 12/2011    left arm  . Elbow surgery 1988    right  . Joint replacement 90's and 2000's    bil knee  . Colectomy     d/t diverticulosis  . Heel spurs removed 25+yrs ago    bil   . Back surgery 2011  . Cardiac catheterization 01/11/2012  . Colonoscopy   . Growth removed from left forearm     benign  . Esophagogastroduodenoscopy   . Coronary  artery bypass graft 01/18/2012    Procedure: CORONARY ARTERY BYPASS GRAFTING (CABG);  Surgeon: Kerin Perna, MD;  Location: [facility] OR;  Service: Open Heart Surgery;  Laterality: N/A;  times four, using left internal mammary artery  . Endovein harvest of greater saphenous vein 01/18/2012    Procedure: ENDOVEIN HARVEST OF GREATER SAPHENOUS VEIN;  Surgeon: Kerin Perna, MD;  Location: [facility] OR;  Service: Open Heart Surgery;  Laterality: Right;    Family History  Problem Relation Age of Onset  . Colon cancer Neg Hx   . Heart disease Mother     Social History History  Substance Use Topics  . Smoking status: Never Smoker   . Smokeless tobacco: Never Used  . Alcohol Use: No     Comment: 01/11/2012 "last alcohol > 2 yr ago; neer had problem w/it"    Current Outpatient Prescriptions  Medication Sig Dispense Refill  . allopurinol (ZYLOPRIM) 300 MG tablet Take 300 mg by mouth daily.      [name] amiodarone  (PACERONE) 400 MG tablet Take 200 mg by mouth 2 (two) times daily. For 5 days then take Amiodarone 400 mg po daily thereafter      . aspirin EC 325 MG EC tablet Take 1 tablet (325 mg total) by mouth daily.  30 tablet    . atorvastatin (LIPITOR) 40 MG tablet Take 1 tablet (40 mg total) by mouth daily at 6 PM.  30 tablet  1  . calcium carbonate (TUMS EX) 750 MG chewable tablet Chew 2 tablets by mouth daily.       . digoxin (LANOXIN) 0.25 MG tablet Take 1 tablet (0.25 mg total) by mouth daily.  30 tablet  1  . loperamide (IMODIUM) 2 MG capsule Take 6 mg by mouth daily.      [name] losartan (COZAAR) 50 MG tablet Take 1 tablet (50 mg total) by mouth daily.  30 tablet  1  . metFORMIN (GLUCOPHAGE) 500 MG tablet Take 1 tablet (500 mg total) by mouth 2 (two) times daily with a meal.  60 tablet  1  . metoprolol tartrate (LOPRESSOR) 25 MG tablet Take 1 tablet (25 mg total) by mouth 2 (two) times daily.  60 tablet  1  . Multiple Vitamins-Minerals (MULTIVITAMIN WITH MINERALS) tablet Take 1 tablet by mouth daily.        . nortriptyline (PAMELOR) 10 MG capsule Take 10 mg by mouth at bedtime.      [name] OVER THE COUNTER MEDICATION Take 5 g by mouth daily. Fiber Well      . pantoprazole (PROTONIX) 40 MG tablet Take 40 mg by mouth daily.      [name] warfarin (COUMADIN) 1 MG tablet Take 1.5 mg by mouth as directed. 1.5mg  6 days a week.  On 7th day, patient only takes 1mg .      . [DISCONTINUED] amiodarone (PACERONE) 400 MG tablet Take 1 tablet (400 mg total) by mouth 2 (two) times daily. For 5 days then take Amiodarone 400 mg po daily thereafter  60 tablet  1    Allergies  Allergen Reactions  . Morphine Itching    Review of Systems no fever, appetite and energy improved. No surgical wound complaints  BP 126/75  Pulse 94  Resp 20  Ht 6\' 3"  (1.905 m)  Wt 220 lb (99.791 kg)  BMI 27.50 kg/m2  SpO2 97% Physical Exam Alert and comfortable Lungs clear bilaterally Sternum stable and healing well Cardiac rhythm regular  murmur or gallop Abdomen soft without  tenderness Extremities warm without tenderness or edema  Diagnostic Tests: Chest x-ray with clear lung fields no pleural effusion noted Impression:  Doing well 3 weeks postop. The patient will be able to  drive. The patient will start cardiac rehabilitation after the Thanksgiving holidays. Plan: Return for followup in 4 weeks to assess progress. He will resume his daily 5 mg tadalafil

## 2012-02-29 ENCOUNTER — Encounter (HOSPITAL_COMMUNITY)
Admission: RE | Admit: 2012-02-29 | Discharge: 2012-02-29 | Disposition: A | Discharge status: Home / Self Care | Payer: Medicare Other | Source: Ambulatory Visit | Attending: Cardiovascular Disease | Admitting: Cardiovascular Disease

## 2012-02-29 DIAGNOSIS — Z5189 Encounter for other specified aftercare: Secondary | ICD-10-CM | POA: Insufficient documentation

## 2012-02-29 DIAGNOSIS — Z951 Presence of aortocoronary bypass graft: Secondary | ICD-10-CM | POA: Insufficient documentation

## 2012-02-29 DIAGNOSIS — R0602 Shortness of breath: Secondary | ICD-10-CM | POA: Insufficient documentation

## 2012-02-29 DIAGNOSIS — I2510 Atherosclerotic heart disease of native coronary artery without angina pectoris: Secondary | ICD-10-CM | POA: Insufficient documentation

## 2012-02-29 NOTE — Progress Notes (Signed)
Cardiac Rehab Medication Review by a Pharmacist  Does the patient  feel that his/her medications are working for him/her?  yes  Has the patient been experiencing any side effects to the medications prescribed?  yes  Does the patient measure his/her own blood pressure or blood glucose at home?  yes   Does the patient have any problems obtaining medications due to transportation or finances?   no  Understanding of regimen: good Understanding of indications: good Potential of compliance: excellent    Pharmacist comments: Patient was knowledgeable with good questions about his regimen. He has been experiencing dry mouth with the nortriptyline without much help with his sleeping. He has tried most commonly used sleeping medications without much success.     Drue Stager 02/29/2012 8:50 AM

## 2012-03-04 ENCOUNTER — Encounter (HOSPITAL_COMMUNITY)
Admission: RE | Admit: 2012-03-04 | Discharge: 2012-03-04 | Disposition: A | Discharge status: Home / Self Care | Payer: Medicare Other | Source: Ambulatory Visit | Attending: Cardiovascular Disease | Admitting: Cardiovascular Disease

## 2012-03-04 ENCOUNTER — Ambulatory Visit (HOSPITAL_COMMUNITY): Payer: Medicare Other

## 2012-03-04 LAB — GLUCOSE, CAPILLARY: Glucose-Capillary: 130 mg/dL — ABNORMAL HIGH (ref 70–99)

## 2012-03-04 NOTE — Progress Notes (Signed)
Pt started cardiac rehab today.  Pt tolerated light exercise without difficulty.  VSS, telemetry-normal sinus rhythm. Asymptomatic. Pt chose to not complete quality of life or psychosocial questionnaire in new pt packet.   Pt oriented to exercise equipment and routine.  Understanding verbalized.

## 2012-03-06 ENCOUNTER — Encounter (HOSPITAL_COMMUNITY): Payer: Medicare Other

## 2012-03-06 ENCOUNTER — Ambulatory Visit (HOSPITAL_COMMUNITY): Payer: Medicare Other

## 2012-03-06 ENCOUNTER — Encounter: Payer: Self-pay | Admitting: Cardiothoracic Surgery

## 2012-03-06 ENCOUNTER — Ambulatory Visit (INDEPENDENT_AMBULATORY_CARE_PROVIDER_SITE_OTHER): Payer: Self-pay | Admitting: Cardiothoracic Surgery

## 2012-03-06 VITALS — BP 125/67 | HR 84 | Resp 18 | Ht 75.0 in | Wt 234.0 lb

## 2012-03-06 DIAGNOSIS — I2510 Atherosclerotic heart disease of native coronary artery without angina pectoris: Secondary | ICD-10-CM

## 2012-03-06 DIAGNOSIS — Z951 Presence of aortocoronary bypass graft: Secondary | ICD-10-CM

## 2012-03-06 NOTE — Progress Notes (Signed)
PCP is [name], MD Referring Provider is Pharr, Maryagnes Amos,*  Chief Complaint  Patient presents with  . Routine Post Op    3 week f/u, S/P CABG x 4 on 01/18/12    HPI: Patient returns for final followup after underling multivessel bypass grafting for three-vessel CAD and unstable angina 8 weeks ago. Patient is doing well and has started outpatient cardiac rehabilitation The surgical incisions are healing well and he has no recurrent angina or symptoms of CHF. Patient does complain of dyspnea on exertion however he is chest x-ray has been clear and on ambulation his oxygen saturation increased from 95% to 97% He has maintained sinus rhythm since discharge home on amiodarone 1 tablet daily 200 mg  Past Medical History  Diagnosis Date  . Adenomatous colon polyp   . Hemorrhoids   . Diverticulosis   . Gastritis   . IC (interstitial cystitis)   . Esophageal stricture   . Diverticulitis   . Hyperlipidemia     doesn't require meds  . Anginal pain 01/11/2012  . Bronchitis     "I've had it a few times" (01/11/2012)  . Pre-diabetes     "check my sugars q 3 days" (01/11/2012)  . H/O hiatal hernia   . Arthritis     "knees" (01/11/2012)  . Gout   . Dysrhythmia     bradycardia in the 30's (01/11/2012)  . Coronary artery disease   . Hypertension     takes Losartan daily  . Diabetes mellitus without complication     "borderline"  . Shortness of breath 01/11/2012    pt states all of the time  . History of bronchitis     last time about 3-10yrs ago  . Joint pain   . Joint swelling   . Gout     takes ALlopurinol daily  . GERD (gastroesophageal reflux disease)     takes Pantoprazole daily  . History of colon polyps   . Urinary frequency   . Interstitial cystitis   . Depression     does't require any meds  . Nocturia   . Insomnia     takes Nortrypyline nightly    Past Surgical History  Procedure Date  . Reconstruction medial collateral ligament elbow w/  tendon graft 1988 X 2    right  . Total knee arthroplasty ~ 2001; 2011    right; left  . Bladder lesion removed   . Exploratory laparotomy 2009    with right colectomy  . Tonsillectomy and adenoidectomy ~1943  . Appendectomy 2009  . Cholecystectomy 2009  . Knee arthroscopy ~ 2001; 2011    right; left  . Lumbar disc surgery ?2010    "cleaned out the stenosis" (01/11/2012)  . Cystoscopy with ureteroscopy 2008; 2009    "painted inside of bladder wall"  . Cataract extraction w/ intraocular lens  implant, bilateral 2011  . Tumor excision 12/2011    left arm  . Elbow surgery 1988    right  . Joint replacement 90's and 2000's    bil knee  . Colectomy     d/t diverticulosis  . Heel spurs removed 25+yrs ago    bil   . Back surgery 2011  . Cardiac catheterization 01/11/2012  . Colonoscopy   . Growth removed from left forearm     benign  . Esophagogastroduodenoscopy   . Coronary artery bypass graft 01/18/2012    Procedure: CORONARY ARTERY BYPASS GRAFTING (CABG);  Surgeon: Kerin Perna, MD;  Location: [facility]  OR;  Service: Open Heart Surgery;  Laterality: N/A;  times four, using left internal mammary artery  . Endovein harvest of greater saphenous vein 01/18/2012    Procedure: ENDOVEIN HARVEST OF GREATER SAPHENOUS VEIN;  Surgeon: Kerin Perna, MD;  Location: [facility] OR;  Service: Open Heart Surgery;  Laterality: Right;    Family History  Problem Relation Age of Onset  . Colon cancer Neg Hx   . Heart disease Mother     Social History History  Substance Use Topics  . Smoking status: Never Smoker   . Smokeless tobacco: Never Used  . Alcohol Use: No     Comment: 01/11/2012 "last alcohol > 2 yr ago; neer had problem w/it"    Current Outpatient Prescriptions  Medication Sig Dispense Refill  . allopurinol (ZYLOPRIM) 300 MG tablet Take 300 mg by mouth daily.      [name] aspirin EC 325 MG EC tablet Take 1 tablet (325 mg total) by mouth daily.  30 tablet    . atorvastatin (LIPITOR) 40 MG  tablet Take 1 tablet (40 mg total) by mouth daily at 6 PM.  30 tablet  1  . calcium carbonate (TUMS EX) 750 MG chewable tablet Chew 2 tablets by mouth daily.       . digoxin (LANOXIN) 0.25 MG tablet Take 1 tablet (0.25 mg total) by mouth daily.  30 tablet  1  . loperamide (IMODIUM) 2 MG capsule Take 2 mg by mouth as needed.       [name] losartan (COZAAR) 50 MG tablet Take 1 tablet (50 mg total) by mouth daily.  30 tablet  1  . metFORMIN (GLUCOPHAGE) 500 MG tablet Take 1 tablet (500 mg total) by mouth 2 (two) times daily with a meal.  60 tablet  1  . metoprolol tartrate (LOPRESSOR) 25 MG tablet Take 1 tablet (25 mg total) by mouth 2 (two) times daily.  60 tablet  1  . Multiple Vitamins-Minerals (MULTIVITAMIN WITH MINERALS) tablet Take 1 tablet by mouth daily.        . nortriptyline (PAMELOR) 10 MG capsule Take 10 mg by mouth at bedtime. Take 10 to 30 mg for sleep      . OVER THE COUNTER MEDICATION Take 10 g by mouth daily. Fiber Well      . pantoprazole (PROTONIX) 40 MG tablet Take 40 mg by mouth daily.      . tadalafil (CIALIS) 10 MG tablet Take 10 mg by mouth daily as needed. Take daily for interstitial cystitis      . warfarin (COUMADIN) 1 MG tablet Take 1.5 mg by mouth as directed. 1.5mg  6 days a week.  On 7th day, patient only takes 1mg .      . amiodarone (PACERONE) 400 MG tablet Take by mouth daily. 200 mg tablet po daily        Allergies  Allergen Reactions  . Morphine Itching    Review of Systems no fever still some insomnia appetite improving strength improving he enjoys outpatient cardiac rehabilitation  BP 125/67  Pulse 84  Resp 18  Ht 6\' 3"  (1.905 m)  Wt 234 lb (106.142 kg)  BMI 29.25 kg/m2  SpO2 96% Physical Exam Alert and comfortable Lungs clear Heart rhythm regular no murmur Incisions well healed No pedal edema   Diagnostic Tests:   Impression: Doing well following bypass surgery in October return to the care of his cardiologist and primary care physicians. Activity  limitations discussed with patient Plan:

## 2012-03-08 ENCOUNTER — Encounter (HOSPITAL_COMMUNITY)
Admission: RE | Admit: 2012-03-08 | Discharge: 2012-03-08 | Disposition: A | Discharge status: Home / Self Care | Payer: Medicare Other | Source: Ambulatory Visit | Attending: Cardiovascular Disease | Admitting: Cardiovascular Disease

## 2012-03-08 NOTE — Progress Notes (Signed)
Pt has expressed written desire to not complete pre rehab  quality of life and psychosocial questionnaire.  Pt verbalizes recent death of father in law which has had significant impact on him and his wife.  Otherwise, pt does not express concerns at this time.  Will continue to monitor throughout program.

## 2012-03-11 ENCOUNTER — Ambulatory Visit (HOSPITAL_COMMUNITY): Payer: Medicare Other

## 2012-03-11 ENCOUNTER — Encounter (HOSPITAL_COMMUNITY): Payer: Self-pay

## 2012-03-11 ENCOUNTER — Encounter (HOSPITAL_COMMUNITY)
Admission: RE | Admit: 2012-03-11 | Discharge: 2012-03-11 | Disposition: A | Discharge status: Home / Self Care | Payer: Medicare Other | Source: Ambulatory Visit | Attending: Cardiovascular Disease | Admitting: Cardiovascular Disease

## 2012-03-11 LAB — GLUCOSE, CAPILLARY: Glucose-Capillary: 92 mg/dL (ref 70–99)

## 2012-03-15 ENCOUNTER — Encounter: Payer: Self-pay | Admitting: Internal Medicine

## 2012-03-15 ENCOUNTER — Encounter (HOSPITAL_COMMUNITY)
Admission: RE | Admit: 2012-03-15 | Discharge: 2012-03-15 | Disposition: A | Discharge status: Home / Self Care | Payer: Medicare Other | Source: Ambulatory Visit | Attending: Cardiovascular Disease | Admitting: Cardiovascular Disease

## 2012-03-15 LAB — GLUCOSE, CAPILLARY: Glucose-Capillary: 168 mg/dL — ABNORMAL HIGH (ref 70–99)

## 2012-03-15 NOTE — Progress Notes (Signed)
Caydin will be absent on Monday and plans to return to exercise on January 6 th.  Will fax exercise flow sheets to Dr. Allyson Sabal office for review. Martese has an appointment to see Dr. Allyson Sabal next week.

## 2012-03-18 ENCOUNTER — Ambulatory Visit (HOSPITAL_COMMUNITY): Payer: Medicare Other

## 2012-03-18 ENCOUNTER — Encounter (HOSPITAL_COMMUNITY): Payer: Medicare Other

## 2012-03-22 ENCOUNTER — Telehealth (HOSPITAL_COMMUNITY): Payer: Self-pay | Admitting: Cardiac Rehabilitation

## 2012-03-22 ENCOUNTER — Encounter (HOSPITAL_COMMUNITY): Payer: Medicare FFS

## 2012-03-22 NOTE — Telephone Encounter (Signed)
pc to assess reason for absence from cardiac rehab.  Pt states he plans to return rehab on Monday 03/25/2012 at 245.  appt with Dr. Allyson Sabal 03/25/12 at 1145. Will fax rehab report for Dr. Allyson Sabal to review.  Pt states he has been on vacation at the beach this week

## 2012-03-25 ENCOUNTER — Ambulatory Visit (HOSPITAL_COMMUNITY): Payer: Medicare Other

## 2012-03-25 ENCOUNTER — Encounter (HOSPITAL_COMMUNITY)
Admission: RE | Admit: 2012-03-25 | Discharge: 2012-03-25 | Disposition: A | Discharge status: Home / Self Care | Payer: Medicare FFS | Source: Ambulatory Visit | Attending: Cardiovascular Disease | Admitting: Cardiovascular Disease

## 2012-03-25 DIAGNOSIS — I2510 Atherosclerotic heart disease of native coronary artery without angina pectoris: Secondary | ICD-10-CM | POA: Insufficient documentation

## 2012-03-25 DIAGNOSIS — R0602 Shortness of breath: Secondary | ICD-10-CM | POA: Insufficient documentation

## 2012-03-25 DIAGNOSIS — Z951 Presence of aortocoronary bypass graft: Secondary | ICD-10-CM | POA: Insufficient documentation

## 2012-03-25 DIAGNOSIS — Z5189 Encounter for other specified aftercare: Secondary | ICD-10-CM | POA: Insufficient documentation

## 2012-03-26 ENCOUNTER — Other Ambulatory Visit: Payer: Self-pay | Admitting: Physician Assistant

## 2012-03-26 ENCOUNTER — Other Ambulatory Visit (HOSPITAL_COMMUNITY): Payer: Self-pay | Admitting: Cardiovascular Disease

## 2012-03-26 DIAGNOSIS — I2510 Atherosclerotic heart disease of native coronary artery without angina pectoris: Secondary | ICD-10-CM

## 2012-03-26 DIAGNOSIS — Z951 Presence of aortocoronary bypass graft: Secondary | ICD-10-CM

## 2012-03-27 ENCOUNTER — Encounter (HOSPITAL_COMMUNITY)
Admission: RE | Admit: 2012-03-27 | Discharge: 2012-03-27 | Disposition: A | Discharge status: Home / Self Care | Payer: Medicare FFS | Source: Ambulatory Visit | Attending: Cardiovascular Disease | Admitting: Cardiovascular Disease

## 2012-03-27 ENCOUNTER — Other Ambulatory Visit: Payer: Self-pay | Admitting: Physician Assistant

## 2012-03-27 ENCOUNTER — Ambulatory Visit (HOSPITAL_COMMUNITY): Payer: Medicare Other

## 2012-03-27 NOTE — Progress Notes (Signed)
Reviewed home exercise guidelines with patient including endpoints, temperature precautions, target heart rate and rate of perceived exertion. [facility]. Pt voices understanding of instructions given.

## 2012-03-27 NOTE — Progress Notes (Signed)
Spoke to Dr. Hazle Coca triage nurse who states per Dr. Allyson Sabal ok for pt to exercise while awaiting nuclear stress test scheduled later this month. Pt informed to notify staff if sx occur with exercise, when to notify MD, proper use of NTG and when to call 911 reviewed with pt.  Understanding verbalized

## 2012-03-28 ENCOUNTER — Other Ambulatory Visit: Payer: Self-pay | Admitting: Physician Assistant

## 2012-03-29 ENCOUNTER — Encounter (HOSPITAL_COMMUNITY)
Admission: RE | Admit: 2012-03-29 | Discharge: 2012-03-29 | Disposition: A | Discharge status: Home / Self Care | Payer: Medicare FFS | Source: Ambulatory Visit | Attending: Cardiovascular Disease | Admitting: Cardiovascular Disease

## 2012-04-01 ENCOUNTER — Ambulatory Visit (HOSPITAL_COMMUNITY): Payer: Medicare Other

## 2012-04-01 ENCOUNTER — Encounter (HOSPITAL_COMMUNITY)
Admission: RE | Admit: 2012-04-01 | Discharge: 2012-04-01 | Disposition: A | Discharge status: Home / Self Care | Payer: Medicare FFS | Source: Ambulatory Visit | Attending: Cardiovascular Disease | Admitting: Cardiovascular Disease

## 2012-04-03 ENCOUNTER — Encounter (HOSPITAL_COMMUNITY)
Admission: RE | Admit: 2012-04-03 | Discharge: 2012-04-03 | Disposition: A | Discharge status: Home / Self Care | Payer: Medicare FFS | Source: Ambulatory Visit | Attending: Cardiovascular Disease | Admitting: Cardiovascular Disease

## 2012-04-03 ENCOUNTER — Ambulatory Visit (HOSPITAL_COMMUNITY): Payer: Medicare Other

## 2012-04-05 ENCOUNTER — Encounter (HOSPITAL_COMMUNITY)
Admission: RE | Admit: 2012-04-05 | Discharge: 2012-04-05 | Disposition: A | Discharge status: Home / Self Care | Payer: Medicare FFS | Source: Ambulatory Visit | Attending: Cardiovascular Disease | Admitting: Cardiovascular Disease

## 2012-04-05 ENCOUNTER — Encounter (HOSPITAL_COMMUNITY): Payer: Medicare FFS

## 2012-04-08 ENCOUNTER — Encounter (HOSPITAL_COMMUNITY)
Admission: RE | Admit: 2012-04-08 | Discharge: 2012-04-08 | Disposition: A | Discharge status: Home / Self Care | Payer: Medicare FFS | Source: Ambulatory Visit | Attending: Cardiovascular Disease | Admitting: Cardiovascular Disease

## 2012-04-08 ENCOUNTER — Ambulatory Visit (HOSPITAL_COMMUNITY): Payer: Medicare Other

## 2012-04-09 ENCOUNTER — Ambulatory Visit (HOSPITAL_COMMUNITY)
Admission: RE | Admit: 2012-04-09 | Discharge: 2012-04-09 | Disposition: A | Discharge status: Home / Self Care | Payer: Medicare PPO | Source: Ambulatory Visit | Attending: Cardiovascular Disease | Admitting: Cardiovascular Disease

## 2012-04-09 DIAGNOSIS — R0989 Other specified symptoms and signs involving the circulatory and respiratory systems: Secondary | ICD-10-CM | POA: Insufficient documentation

## 2012-04-09 DIAGNOSIS — I2510 Atherosclerotic heart disease of native coronary artery without angina pectoris: Secondary | ICD-10-CM

## 2012-04-09 DIAGNOSIS — R0609 Other forms of dyspnea: Secondary | ICD-10-CM | POA: Insufficient documentation

## 2012-04-09 DIAGNOSIS — R079 Chest pain, unspecified: Secondary | ICD-10-CM | POA: Insufficient documentation

## 2012-04-09 DIAGNOSIS — I10 Essential (primary) hypertension: Secondary | ICD-10-CM | POA: Insufficient documentation

## 2012-04-09 DIAGNOSIS — Z951 Presence of aortocoronary bypass graft: Secondary | ICD-10-CM

## 2012-04-09 HISTORY — PX: NM MYOCAR MULTIPLE W/SPECT: HXRAD626

## 2012-04-09 MED ORDER — TECHNETIUM TC 99M SESTAMIBI GENERIC - CARDIOLITE
30.0000 | Freq: Once | INTRAVENOUS | Status: AC | PRN
  Administered 2012-04-09: 30 via INTRAVENOUS

## 2012-04-09 MED ORDER — REGADENOSON 0.4 MG/5ML IV SOLN
0.4000 mg | Freq: Once | INTRAVENOUS | Status: AC
  Administered 2012-04-09: 0.4 mg via INTRAVENOUS

## 2012-04-09 MED ORDER — TECHNETIUM TC 99M SESTAMIBI GENERIC - CARDIOLITE
10.0000 | Freq: Once | INTRAVENOUS | Status: AC | PRN
  Administered 2012-04-09: 10 via INTRAVENOUS

## 2012-04-09 NOTE — Procedures (Addendum)
[facility] [address] [place] [address] [phone]  Cardiology Nuclear Med Study  Lucas Morales is a 79 y.o. male     MRN : [id number]     DOB: 1933-08-17  Procedure Date: 04/09/2012  Nuclear Med Background Indication for Stress Test:  Graft Patency History:  CABG x 4 12/2011 Cardiac Risk Factors: Hypertension, Lipids and Overweight  Symptoms:  Chest Pain and DOE   Nuclear Pre-Procedure Caffeine/Decaff Intake:  10:00pm NPO After: 8:00am   IV Site: R Antecubital  IV 0.9% NS with Angio Cath:  22g  Chest Size (in):  46 IV Started by: Koren Shiver, CNMT  Height: 6\' 3"  (1.905 m)  Cup Size: n/a  BMI:  Body mass index is 30.00 kg/(m^2). Weight:  240 lb (108.863 kg)   Tech Comments:  n/a    Nuclear Med Study 1 or 2 day study: 1 day  Stress Test Type:  Lexiscan  Order Authorizing Provider:  Nanetta Batty, MD   Resting Radionuclide: Technetium 27m Sestamibi  Resting Radionuclide Dose: 10.4 mCi   Stress Radionuclide:  Technetium 35m Sestamibi  Stress Radionuclide Dose: 30.6 mCi           Stress Protocol Rest HR: 76 Stress HR: 86  Rest BP: 142/87 Stress BP: 145/80  Exercise Time (min): n/a METS: n/a   Predicted Max HR: 144 bpm % Max HR: 59.72 bpm Rate Pressure Product: 21308   Dose of Adenosine (mg):  n/a Dose of Lexiscan: 0.4 mg  Dose of Atropine (mg): n/a Dose of Dobutamine: n/a mcg/kg/min (at max HR)  Stress Test Technologist: Esperanza Sheets, CCT Nuclear Technologist: Koren Shiver, CNMT   Rest Procedure:  Myocardial perfusion imaging was performed at rest 45 minutes following the intravenous administration of Technetium 73m Sestamibi. Stress Procedure:  The patient received IV Lexiscan 0.4 mg over 15-seconds.  Technetium 31m Sestamibi injected at 30-seconds.  There were no significant changes with Lexiscan.  Quantitative spect images were obtained after a 45 minute delay.  Transient Ischemic  Dilatation (Normal <1.22):  1.04 Lung/Heart Ratio (Normal <0.45):  0.28 QGS EDV:  123 ml QGS ESV:  61 ml LV Ejection Fraction: 51%  Signed by .     Rest ECG: NSR - NSR with non-specific ST-T wave changes  Stress ECG: No significant change from baseline ECG  QPS Raw Data Images:  Normal; no motion artifact; normal heart/lung ratio. Stress Images:  Normal homogeneous uptake in all areas of the myocardium. Rest Images:  Mild inferior attenuation defect Subtraction (SDS):  No evidence of ischemia.  Impression Exercise Capacity:  Lexiscan with low level exercise. BP Response:  Normal blood pressure response. Clinical Symptoms:  Mild shortness of breath ECG Impression:  No significant ST segment change suggestive of ischemia. Comparison with Prior Nuclear Study: In comparison to the prior study of 12/2011, perfusion is improved and ischemia is no longer present.  Overall Impression:  Normal stress nuclear study. Low risk stress nuclear study.  LV Wall Motion:  Low normal LV Function, EF 51% with mild apical hypocontractility.   Lennette Bihari, MD  04/09/2012 7:08 PM

## 2012-04-10 ENCOUNTER — Encounter (HOSPITAL_COMMUNITY)
Admission: RE | Admit: 2012-04-10 | Discharge: 2012-04-10 | Disposition: A | Discharge status: Home / Self Care | Payer: Medicare FFS | Source: Ambulatory Visit

## 2012-04-10 ENCOUNTER — Ambulatory Visit (HOSPITAL_COMMUNITY): Payer: Medicare Other

## 2012-04-10 ENCOUNTER — Encounter (HOSPITAL_COMMUNITY): Admission: RE | Admit: 2012-04-10 | Payer: Medicare FFS | Source: Ambulatory Visit

## 2012-04-12 ENCOUNTER — Encounter (HOSPITAL_COMMUNITY)
Admission: RE | Admit: 2012-04-12 | Discharge: 2012-04-12 | Disposition: A | Discharge status: Home / Self Care | Payer: Medicare FFS | Source: Ambulatory Visit | Attending: Cardiovascular Disease | Admitting: Cardiovascular Disease

## 2012-04-15 ENCOUNTER — Encounter (HOSPITAL_COMMUNITY)
Admission: RE | Admit: 2012-04-15 | Discharge: 2012-04-15 | Disposition: A | Discharge status: Home / Self Care | Payer: Medicare FFS | Source: Ambulatory Visit | Attending: Cardiovascular Disease | Admitting: Cardiovascular Disease

## 2012-04-15 ENCOUNTER — Ambulatory Visit (HOSPITAL_COMMUNITY): Payer: Medicare Other

## 2012-04-17 ENCOUNTER — Ambulatory Visit (HOSPITAL_COMMUNITY): Payer: Medicare Other

## 2012-04-17 ENCOUNTER — Encounter (HOSPITAL_COMMUNITY)
Admission: RE | Admit: 2012-04-17 | Discharge: 2012-04-17 | Disposition: A | Discharge status: Home / Self Care | Payer: Medicare FFS | Source: Ambulatory Visit | Attending: Cardiovascular Disease | Admitting: Cardiovascular Disease

## 2012-04-19 ENCOUNTER — Telehealth (HOSPITAL_COMMUNITY): Payer: Self-pay | Admitting: Cardiac Rehabilitation

## 2012-04-19 ENCOUNTER — Encounter (HOSPITAL_COMMUNITY): Payer: Medicare FFS

## 2012-04-19 NOTE — Telephone Encounter (Signed)
pc to pt to advise of need for new parking pass.  Pt reports he has deep chest cold.  Pt plans to return on 04/22/12 and will pick up his parking pass at that time.

## 2012-04-22 ENCOUNTER — Encounter (HOSPITAL_COMMUNITY)
Admission: RE | Admit: 2012-04-22 | Discharge: 2012-04-22 | Disposition: A | Discharge status: Home / Self Care | Payer: Medicare FFS | Source: Ambulatory Visit | Attending: Cardiovascular Disease | Admitting: Cardiovascular Disease

## 2012-04-22 ENCOUNTER — Ambulatory Visit (HOSPITAL_COMMUNITY): Payer: Medicare Other

## 2012-04-22 DIAGNOSIS — I2510 Atherosclerotic heart disease of native coronary artery without angina pectoris: Secondary | ICD-10-CM | POA: Insufficient documentation

## 2012-04-22 DIAGNOSIS — R0602 Shortness of breath: Secondary | ICD-10-CM | POA: Insufficient documentation

## 2012-04-22 DIAGNOSIS — Z5189 Encounter for other specified aftercare: Secondary | ICD-10-CM | POA: Insufficient documentation

## 2012-04-22 DIAGNOSIS — Z951 Presence of aortocoronary bypass graft: Secondary | ICD-10-CM | POA: Insufficient documentation

## 2012-04-24 ENCOUNTER — Ambulatory Visit (HOSPITAL_COMMUNITY): Payer: Medicare Other

## 2012-04-24 ENCOUNTER — Encounter (HOSPITAL_COMMUNITY)
Admission: RE | Admit: 2012-04-24 | Discharge: 2012-04-24 | Disposition: A | Discharge status: Home / Self Care | Payer: Medicare FFS | Source: Ambulatory Visit | Attending: Cardiovascular Disease | Admitting: Cardiovascular Disease

## 2012-04-26 ENCOUNTER — Encounter (HOSPITAL_COMMUNITY): Payer: Medicare FFS

## 2012-04-28 ENCOUNTER — Other Ambulatory Visit: Payer: Self-pay | Admitting: Physician Assistant

## 2012-04-29 ENCOUNTER — Ambulatory Visit (HOSPITAL_COMMUNITY): Payer: Medicare Other

## 2012-04-29 ENCOUNTER — Encounter (HOSPITAL_COMMUNITY): Payer: Medicare FFS

## 2012-05-01 ENCOUNTER — Ambulatory Visit (HOSPITAL_COMMUNITY): Payer: Medicare Other

## 2012-05-01 ENCOUNTER — Encounter (HOSPITAL_COMMUNITY): Admission: RE | Admit: 2012-05-01 | Payer: Medicare FFS | Source: Ambulatory Visit

## 2012-05-03 ENCOUNTER — Encounter (HOSPITAL_COMMUNITY): Payer: Medicare FFS

## 2012-05-06 ENCOUNTER — Encounter (HOSPITAL_COMMUNITY)
Admission: RE | Admit: 2012-05-06 | Discharge: 2012-05-06 | Disposition: A | Discharge status: Home / Self Care | Payer: Medicare FFS | Source: Ambulatory Visit | Attending: Cardiovascular Disease | Admitting: Cardiovascular Disease

## 2012-05-06 ENCOUNTER — Ambulatory Visit (HOSPITAL_COMMUNITY): Payer: Medicare Other

## 2012-05-08 ENCOUNTER — Encounter (HOSPITAL_COMMUNITY)
Admission: RE | Admit: 2012-05-08 | Discharge: 2012-05-08 | Disposition: A | Discharge status: Home / Self Care | Payer: Medicare FFS | Source: Ambulatory Visit | Attending: Cardiovascular Disease | Admitting: Cardiovascular Disease

## 2012-05-08 ENCOUNTER — Ambulatory Visit (HOSPITAL_COMMUNITY): Payer: Medicare Other

## 2012-05-10 ENCOUNTER — Encounter (HOSPITAL_COMMUNITY): Payer: Medicare FFS

## 2012-05-13 ENCOUNTER — Ambulatory Visit (HOSPITAL_COMMUNITY): Payer: Medicare Other

## 2012-05-13 ENCOUNTER — Encounter (HOSPITAL_COMMUNITY): Payer: Medicare FFS

## 2012-05-15 ENCOUNTER — Ambulatory Visit (HOSPITAL_COMMUNITY): Payer: Medicare Other

## 2012-05-15 ENCOUNTER — Encounter (HOSPITAL_COMMUNITY): Payer: Medicare FFS

## 2012-05-17 ENCOUNTER — Encounter (HOSPITAL_COMMUNITY): Payer: Medicare FFS

## 2012-05-20 ENCOUNTER — Encounter (HOSPITAL_COMMUNITY): Payer: Medicare PPO

## 2012-05-20 ENCOUNTER — Ambulatory Visit (HOSPITAL_COMMUNITY): Payer: Medicare Other

## 2012-05-20 DIAGNOSIS — R0602 Shortness of breath: Secondary | ICD-10-CM | POA: Insufficient documentation

## 2012-05-20 DIAGNOSIS — I2510 Atherosclerotic heart disease of native coronary artery without angina pectoris: Secondary | ICD-10-CM | POA: Insufficient documentation

## 2012-05-20 DIAGNOSIS — Z5189 Encounter for other specified aftercare: Secondary | ICD-10-CM | POA: Insufficient documentation

## 2012-05-20 DIAGNOSIS — Z951 Presence of aortocoronary bypass graft: Secondary | ICD-10-CM | POA: Insufficient documentation

## 2012-05-22 ENCOUNTER — Encounter (HOSPITAL_COMMUNITY): Payer: Medicare PPO

## 2012-05-22 ENCOUNTER — Ambulatory Visit (HOSPITAL_COMMUNITY): Payer: Medicare Other

## 2012-05-24 ENCOUNTER — Encounter (HOSPITAL_COMMUNITY): Payer: Medicare PPO

## 2012-05-27 ENCOUNTER — Ambulatory Visit (HOSPITAL_COMMUNITY): Payer: Medicare Other

## 2012-05-27 ENCOUNTER — Encounter (HOSPITAL_COMMUNITY): Payer: Medicare PPO

## 2012-05-27 ENCOUNTER — Encounter (HOSPITAL_COMMUNITY)
Admission: RE | Admit: 2012-05-27 | Discharge: 2012-05-27 | Disposition: A | Discharge status: Home / Self Care | Payer: Medicare PPO | Source: Ambulatory Visit | Attending: Cardiovascular Disease | Admitting: Cardiovascular Disease

## 2012-05-28 LAB — GLUCOSE, CAPILLARY: Glucose-Capillary: 88 mg/dL (ref 70–99)

## 2012-05-29 ENCOUNTER — Encounter (HOSPITAL_COMMUNITY): Payer: Medicare PPO

## 2012-05-29 ENCOUNTER — Ambulatory Visit (HOSPITAL_COMMUNITY): Payer: Medicare Other

## 2012-05-29 ENCOUNTER — Encounter (HOSPITAL_COMMUNITY)
Admission: RE | Admit: 2012-05-29 | Discharge: 2012-05-29 | Disposition: A | Discharge status: Home / Self Care | Payer: Medicare PPO | Source: Ambulatory Visit | Attending: Cardiovascular Disease | Admitting: Cardiovascular Disease

## 2012-05-29 LAB — GLUCOSE, CAPILLARY: Glucose-Capillary: 123 mg/dL — ABNORMAL HIGH (ref 70–99)

## 2012-05-31 ENCOUNTER — Encounter (HOSPITAL_COMMUNITY)
Admission: RE | Admit: 2012-05-31 | Discharge: 2012-05-31 | Disposition: A | Discharge status: Home / Self Care | Payer: Medicare PPO | Source: Ambulatory Visit | Attending: Cardiovascular Disease | Admitting: Cardiovascular Disease

## 2012-05-31 ENCOUNTER — Encounter (HOSPITAL_COMMUNITY): Payer: Medicare PPO

## 2012-06-03 ENCOUNTER — Encounter (HOSPITAL_COMMUNITY)
Admission: RE | Admit: 2012-06-03 | Discharge: 2012-06-03 | Disposition: A | Discharge status: Home / Self Care | Payer: Medicare PPO | Source: Ambulatory Visit | Attending: Cardiovascular Disease | Admitting: Cardiovascular Disease

## 2012-06-03 ENCOUNTER — Encounter (HOSPITAL_COMMUNITY): Payer: Medicare PPO

## 2012-06-03 ENCOUNTER — Ambulatory Visit (HOSPITAL_COMMUNITY): Payer: Medicare Other

## 2012-06-03 LAB — GLUCOSE, CAPILLARY: Glucose-Capillary: 119 mg/dL — ABNORMAL HIGH (ref 70–99)

## 2012-06-03 NOTE — Progress Notes (Signed)
Lucas Morales 79 y.o. male Nutrition Note Spoke with pt.  Nutrition Plan and Nutrition Survey goals reviewed with pt. Pt is following Step 1 of the Therapeutic Lifestyle Changes diet. Pt wants to lose wt. Pt reports he lost 30# after his surgery and has regained "some of it." Pt wt today 110.3 kg, which is up 3.9 kg (242.7#) over the past 2 months. Wt loss tips reviewed.  Pt is diabetic according to the medical record. Pt states he has been told he is pre-diabetic.  Last A1c indicates blood glucose well-controlled. Pt checks fasting CBG's daily. Per pt fasting CBG's 140-150 mg/dL. Pt was aware fasting CBG's slightly higher than desired. This Clinical research associate went over Diabetes Education test results. Pt expressed understanding of the information reviewed. Pt aware of nutrition education classes offered and is unable to attend nutrition classes. Per discussion with pt, Coumadin has been discontinued.  Nutrition Diagnosis   Food-and nutrition-related knowledge deficit related to lack of exposure to information as related to diagnosis of: ? CVD ? DM (A1c 6.2) ?    Obesity related to excessive energy intake as evidenced by a BMI of 30.7  Nutrition RX/ Estimated Daily Nutrition Needs for: wt loss  1800-2300 Kcal, 45-60 gm fat, 11-15 gm sat fat, 1.7-2.3 gm trans-fat, <1500 mg sodium, 250 gm CHO   Nutrition Intervention   Pt's individual nutrition plan reviewed with pt.   Benefits of adopting Therapeutic Lifestyle Changes discussed when Medficts reviewed.   Pt to attend the Portion Distortion class   Pt to attend the Diabetes Q and A class   Pt given handouts for: ? Nutrition I class ? Nutrition II class ? DM    Continue client-centered nutrition education by RD, as part of interdisciplinary care. Goal(s)   Pt to identify and limit food sources of saturated fat, trans fat, and cholesterol   Pt to identify food quantities necessary to achieve: ? wt loss to a goal wt of 210-222 lb (95.5-100.9 kg) at graduation  from cardiac rehab.  Monitor and Evaluate progress toward nutrition goal with team. Nutrition Risk: Change to Moderate   Mickle Plumb, M.Ed, RD, LDN, CDE 06/03/2012 3:35 PM

## 2012-06-04 ENCOUNTER — Encounter: Payer: Self-pay | Admitting: Internal Medicine

## 2012-06-05 ENCOUNTER — Ambulatory Visit (HOSPITAL_COMMUNITY): Payer: Medicare Other

## 2012-06-05 ENCOUNTER — Encounter (HOSPITAL_COMMUNITY): Payer: Medicare PPO

## 2012-06-05 ENCOUNTER — Encounter (HOSPITAL_COMMUNITY)
Admission: RE | Admit: 2012-06-05 | Discharge: 2012-06-05 | Disposition: A | Discharge status: Home / Self Care | Payer: Medicare PPO | Source: Ambulatory Visit | Attending: Cardiovascular Disease | Admitting: Cardiovascular Disease

## 2012-06-07 ENCOUNTER — Encounter (HOSPITAL_COMMUNITY): Payer: Medicare PPO

## 2012-06-10 ENCOUNTER — Encounter (HOSPITAL_COMMUNITY)
Admission: RE | Admit: 2012-06-10 | Discharge: 2012-06-10 | Disposition: A | Discharge status: Home / Self Care | Payer: Medicare PPO | Source: Ambulatory Visit | Attending: Cardiovascular Disease | Admitting: Cardiovascular Disease

## 2012-06-10 ENCOUNTER — Encounter (HOSPITAL_COMMUNITY): Payer: Medicare PPO

## 2012-06-10 ENCOUNTER — Ambulatory Visit (HOSPITAL_COMMUNITY): Payer: Medicare Other

## 2012-06-12 ENCOUNTER — Ambulatory Visit (HOSPITAL_COMMUNITY): Payer: Medicare Other

## 2012-06-12 ENCOUNTER — Encounter (HOSPITAL_COMMUNITY): Payer: Medicare PPO

## 2012-06-12 ENCOUNTER — Encounter (HOSPITAL_COMMUNITY)
Admission: RE | Admit: 2012-06-12 | Discharge: 2012-06-12 | Disposition: A | Discharge status: Home / Self Care | Payer: Medicare PPO | Source: Ambulatory Visit | Attending: Cardiovascular Disease | Admitting: Cardiovascular Disease

## 2012-06-14 ENCOUNTER — Encounter (HOSPITAL_COMMUNITY): Payer: Medicare PPO

## 2012-06-14 ENCOUNTER — Encounter (HOSPITAL_COMMUNITY)
Admission: RE | Admit: 2012-06-14 | Discharge: 2012-06-14 | Disposition: A | Discharge status: Home / Self Care | Payer: Medicare PPO | Source: Ambulatory Visit | Attending: Cardiovascular Disease | Admitting: Cardiovascular Disease

## 2012-06-17 ENCOUNTER — Ambulatory Visit (HOSPITAL_COMMUNITY): Payer: Medicare Other

## 2012-06-17 ENCOUNTER — Encounter (HOSPITAL_COMMUNITY): Payer: Medicare PPO

## 2012-06-17 ENCOUNTER — Encounter (HOSPITAL_COMMUNITY)
Admission: RE | Admit: 2012-06-17 | Discharge: 2012-06-17 | Disposition: A | Discharge status: Home / Self Care | Payer: Medicare PPO | Source: Ambulatory Visit | Attending: Cardiovascular Disease | Admitting: Cardiovascular Disease

## 2012-06-17 LAB — GLUCOSE, CAPILLARY: Glucose-Capillary: 135 mg/dL — ABNORMAL HIGH (ref 70–99)

## 2012-06-19 ENCOUNTER — Encounter (HOSPITAL_COMMUNITY): Payer: Medicare PPO

## 2012-06-19 ENCOUNTER — Ambulatory Visit (HOSPITAL_COMMUNITY): Payer: Medicare Other

## 2012-06-21 ENCOUNTER — Encounter (HOSPITAL_COMMUNITY)
Admission: RE | Admit: 2012-06-21 | Discharge: 2012-06-21 | Disposition: A | Discharge status: Home / Self Care | Payer: Medicare PPO | Source: Ambulatory Visit | Attending: Cardiovascular Disease | Admitting: Cardiovascular Disease

## 2012-06-21 ENCOUNTER — Encounter (HOSPITAL_COMMUNITY): Payer: Medicare PPO

## 2012-06-21 DIAGNOSIS — Z951 Presence of aortocoronary bypass graft: Secondary | ICD-10-CM | POA: Insufficient documentation

## 2012-06-21 DIAGNOSIS — Z5189 Encounter for other specified aftercare: Secondary | ICD-10-CM | POA: Insufficient documentation

## 2012-06-21 DIAGNOSIS — R0602 Shortness of breath: Secondary | ICD-10-CM | POA: Insufficient documentation

## 2012-06-21 DIAGNOSIS — I2510 Atherosclerotic heart disease of native coronary artery without angina pectoris: Secondary | ICD-10-CM | POA: Insufficient documentation

## 2012-06-24 ENCOUNTER — Encounter (HOSPITAL_COMMUNITY): Payer: Medicare PPO

## 2012-06-24 ENCOUNTER — Encounter (HOSPITAL_COMMUNITY)
Admission: RE | Admit: 2012-06-24 | Discharge: 2012-06-24 | Disposition: A | Discharge status: Home / Self Care | Payer: Medicare PPO | Source: Ambulatory Visit | Attending: Cardiovascular Disease | Admitting: Cardiovascular Disease

## 2012-06-24 ENCOUNTER — Ambulatory Visit (HOSPITAL_COMMUNITY): Payer: Medicare Other

## 2012-06-25 ENCOUNTER — Ambulatory Visit (AMBULATORY_SURGERY_CENTER): Payer: Medicare PPO

## 2012-06-25 ENCOUNTER — Encounter: Payer: Self-pay | Admitting: Internal Medicine

## 2012-06-25 VITALS — Ht 75.0 in | Wt 244.0 lb

## 2012-06-25 DIAGNOSIS — Z9049 Acquired absence of other specified parts of digestive tract: Secondary | ICD-10-CM

## 2012-06-25 DIAGNOSIS — Z1211 Encounter for screening for malignant neoplasm of colon: Secondary | ICD-10-CM

## 2012-06-25 DIAGNOSIS — Z86010 Personal history of colonic polyps: Secondary | ICD-10-CM

## 2012-06-25 DIAGNOSIS — Z9889 Other specified postprocedural states: Secondary | ICD-10-CM

## 2012-06-25 MED ORDER — MOVIPREP 100 G PO SOLR: ORAL | Status: DC

## 2012-06-26 ENCOUNTER — Encounter (HOSPITAL_COMMUNITY)
Admission: RE | Admit: 2012-06-26 | Discharge: 2012-06-26 | Disposition: A | Discharge status: Home / Self Care | Payer: Medicare PPO | Source: Ambulatory Visit | Attending: Cardiovascular Disease | Admitting: Cardiovascular Disease

## 2012-06-26 ENCOUNTER — Ambulatory Visit (HOSPITAL_COMMUNITY): Payer: Medicare Other

## 2012-06-26 ENCOUNTER — Encounter (HOSPITAL_COMMUNITY): Payer: Medicare PPO

## 2012-06-27 ENCOUNTER — Encounter: Payer: Self-pay | Admitting: *Deleted

## 2012-06-28 ENCOUNTER — Encounter (HOSPITAL_COMMUNITY): Payer: Medicare PPO

## 2012-07-01 ENCOUNTER — Ambulatory Visit (HOSPITAL_COMMUNITY): Payer: Medicare Other

## 2012-07-01 ENCOUNTER — Encounter (HOSPITAL_COMMUNITY): Payer: Medicare PPO

## 2012-07-03 ENCOUNTER — Encounter (HOSPITAL_COMMUNITY): Payer: Medicare PPO

## 2012-07-03 ENCOUNTER — Ambulatory Visit (HOSPITAL_COMMUNITY): Payer: Medicare Other

## 2012-07-05 ENCOUNTER — Encounter (HOSPITAL_COMMUNITY)
Admission: RE | Admit: 2012-07-05 | Discharge: 2012-07-05 | Disposition: A | Discharge status: Home / Self Care | Payer: Medicare PPO | Source: Ambulatory Visit | Attending: Cardiovascular Disease | Admitting: Cardiovascular Disease

## 2012-07-05 ENCOUNTER — Encounter (HOSPITAL_COMMUNITY): Payer: Medicare PPO

## 2012-07-05 NOTE — Progress Notes (Signed)
Lucas Morales graduates today. Lucas Morales has a treadmill and a bike at home.  Lucas Morales also is going to attend water aerobics classes.

## 2012-07-08 ENCOUNTER — Ambulatory Visit (INDEPENDENT_AMBULATORY_CARE_PROVIDER_SITE_OTHER): Payer: Medicare PPO | Admitting: Pulmonary Disease

## 2012-07-08 ENCOUNTER — Encounter: Payer: Self-pay | Admitting: Pulmonary Disease

## 2012-07-08 ENCOUNTER — Ambulatory Visit (HOSPITAL_COMMUNITY): Payer: Medicare Other

## 2012-07-08 VITALS — BP 142/80 | HR 72 | Temp 97.5°F | Ht 74.0 in | Wt 248.0 lb

## 2012-07-08 DIAGNOSIS — R0609 Other forms of dyspnea: Secondary | ICD-10-CM

## 2012-07-08 DIAGNOSIS — R0602 Shortness of breath: Secondary | ICD-10-CM

## 2012-07-08 DIAGNOSIS — R0600 Dyspnea, unspecified: Secondary | ICD-10-CM

## 2012-07-08 LAB — GLUCOSE, CAPILLARY: Glucose-Capillary: 147 mg/dL — ABNORMAL HIGH (ref 70–99)

## 2012-07-08 MED ORDER — ALBUTEROL SULFATE HFA 108 (90 BASE) MCG/ACT IN AERS: 2.0000 | INHALATION_SPRAY | Freq: Four times a day (QID) | RESPIRATORY_TRACT | Status: DC | PRN

## 2012-07-08 NOTE — Progress Notes (Deleted)
  Subjective:    Patient ID: Lucas Morales, male    DOB: [birth year], 79 y.o.   MRN: [id number]  HPI    Review of Systems  Constitutional: Negative for appetite change and unexpected weight change.  HENT: Positive for congestion and sneezing. Negative for ear pain, sore throat, trouble swallowing and dental problem.   Respiratory: Positive for shortness of breath. Negative for cough.   Cardiovascular: Positive for chest pain. Negative for palpitations and leg swelling.  Gastrointestinal: Negative for abdominal pain.  Musculoskeletal: Negative for joint swelling.  Skin: Negative for rash.  Neurological: Negative for headaches.  Psychiatric/Behavioral: Negative for dysphoric mood. The patient is not nervous/anxious.        Objective:   Physical Exam        Assessment & Plan:

## 2012-07-08 NOTE — Progress Notes (Signed)
Chief Complaint  Patient presents with  . Advice Only    C/o SOB not getting better-has been doing [facility] and still not helping. Also c/o chest tx and CP around heart, chest congestion-spits up clear phlem. denies any wheezing.     CC: [name]  History of Present Illness: Lucas Morales is a 79 y.o. male never smoker for evaluation of dyspnea.  He has noticed problems with his breathing for some time.  He had cardiac evaluation and was found to have significant coronary artery disease.  He had CABG 12/31/11.  He has recovered from surgery, but continues to have trouble with his breathing.  He is followed by Dr. [name] with [facility] cardiology.  The patient reports having recent cardiac evaluation with a stress test, and was advised that his heart function was okay.  As a result he has been referred to pulmonary medicine for further assessment of his dyspnea.  His breathing is okay at rest, but he gets winded with activities such as walking up one flight of steps.  He has been enrolled cardiac rehab, but this has not improved his exercise stamina.    He also notices trouble with his breathing while asleep.  He can fall asleep easily, but will wake up feeling like he can't breath.  This will be associated with a tight feeling around his mid chest.  He does snore.  He had a sleep study about 10 years ago, and was told this was negative for sleep apnea.  He has not had testing done since for sleep apnea.  He also has trouble with his sleep due to frequent urination at night.  He relates this to history of interstitial cystitis.  As a result of sleep disruption he is tired during the day, and can nap up to an hour per day.  His mother was a heavy smoker (4 to 5 packs per day), and he had second hand exposure as a child.  His mother was diagnosed with emphysema in her 36's and died from this in her 34's.  He never smoked.  He gets occasional cough and sneezing.  He frequently feels  like he has chest congestion and will occasionally bring up clear sputum.  He denies hemoptysis.  He is not aware of any specific triggers that induce his cough/sneeze/sputum production.  He worked as an Programmer, systems, and denies any occupational exposures.  He used to restore antique picture frames as a hobby, and used steel wool to clean the frames.  There is no history of pneumonia or exposure to tuberculosis.  He is from [place], and has always lived between [place], [place], and [place].  He denies any travel history.  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  He was in [place] while in school, but never in the active [employer].  Tests: 01/04/12 Echo >> mod/severe LVH, EF 55%, mod/severe LA dilation, mod MR 01/12/12 PFT >> FEV1 3.33 (101%), FEV1% 72, TLC 6.98 (93%), DLCO 86%  Lucas Morales  has a past medical history of Adenomatous colon polyp; Hemorrhoids; Diverticulosis; Gastritis; IC (interstitial cystitis); Esophageal stricture; Diverticulitis; Hyperlipidemia; Anginal pain (01/11/2012); Bronchitis; Pre-diabetes; Hiatal hernia; Arthritis; Gout; Dysrhythmia; Coronary artery disease; Hypertension; Diabetes mellitus without complication; Shortness of breath (01/11/2012); History of bronchitis; Joint pain; Joint swelling; Gout; GERD (gastroesophageal reflux disease); History of colon polyps; Urinary frequency; Interstitial cystitis; Depression; Nocturia; Insomnia; and Esophageal dysmotility.  Lucas Morales  has past surgical history that includes  Reconstruction medial collateral ligament elbow w/ tendon graft (1988 X 2); Total knee arthroplasty (~ 2001; 2011); bladder lesion removed; Exploratory laparotomy (2009); Tonsillectomy and adenoidectomy (~1610); Appendectomy (2009); Cholecystectomy (2009); Lumbar disc surgery (?2010); Cystoscopy with ureteroscopy (2008; 2009); Cataract extraction w/ intraocular lens  implant, bilateral (2011); Tumor excision (12/2011); Elbow surgery  (1988); Joint replacement (90's and 2000's); Colectomy; heel spurs removed (25+yrs ago); Cardiac catheterization (01/11/2012); Colonoscopy; Esophagogastroduodenoscopy; Coronary artery bypass graft (01/18/2012); and Endoharvest vein of greater saphenous vein (01/18/2012).  Prior to Admission medications   Medication Sig Start Date End Date Taking? Authorizing Provider  aspirin EC 325 MG EC tablet Take 1 tablet (325 mg total) by mouth daily. 01/22/12  Yes Erin Barrett, PA-C  atorvastatin (LIPITOR) 40 MG tablet Take 1 tablet (40 mg total) by mouth daily at 6 PM. 01/26/12  Yes Donielle Margaretann Loveless, PA-C  calcium carbonate (TUMS EX) 750 MG chewable tablet Chew 2 tablets by mouth daily.    Yes Historical Provider, MD  loperamide (IMODIUM) 2 MG capsule Take 2 mg by mouth as needed.    Yes Historical Provider, MD  losartan (COZAAR) 50 MG tablet Take 1 tablet (50 mg total) by mouth daily. 01/26/12  Yes Donielle Margaretann Loveless, PA-C  metFORMIN (GLUCOPHAGE) 500 MG tablet Take 1 tablet (500 mg total) by mouth 2 (two) times daily with a meal. 01/26/12  Yes Donielle Margaretann Loveless, PA-C  metoprolol tartrate (LOPRESSOR) 25 MG tablet Take 1 tablet (25 mg total) by mouth 2 (two) times daily. 01/26/12  Yes Donielle Margaretann Loveless, PA-C  mometasone (NASONEX) 50 MCG/ACT nasal spray Place 2 sprays into the nose 2 (two) times daily.    Yes Historical Provider, MD  Multiple Vitamins-Minerals (MULTIVITAMIN WITH MINERALS) tablet Take 1 tablet by mouth daily.     Yes Historical Provider, MD  naproxen (NAPROSYN) 500 MG tablet Take 500 mg by mouth as needed. Take 3 pills prn gout attack   Yes Historical Provider, MD  nortriptyline (PAMELOR) 10 MG capsule Take 10 mg by mouth at bedtime. Take 10 to 30 mg for sleep   Yes Historical Provider, MD  OVER THE COUNTER MEDICATION Fiber Well--2 tabs dailt   Yes Historical Provider, MD  pantoprazole (PROTONIX) 40 MG tablet Take 40 mg by mouth 2 (two) times daily.    Yes Historical Provider, MD   tadalafil (CIALIS) 10 MG tablet Take 10 mg by mouth daily as needed. Take daily for interstitial cystitis   Yes Historical Provider, MD  MOVIPREP 100 G SOLR Moviprep as directed / no substitutions 06/25/12   Hart Carwin, MD    Allergies  Allergen Reactions  . Morphine Itching    His family history includes Emphysema in his maternal grandfather and mother and Kidney disease in his father.  There is no history of Colon cancer.  He  reports that he has never smoked. He has never used smokeless tobacco. He reports that he does not drink alcohol or use illicit drugs.  Review of Systems  Constitutional: Negative for appetite change and unexpected weight change.  HENT: Positive for congestion and sneezing. Negative for ear pain, sore throat, trouble swallowing and dental problem.   Respiratory: Positive for shortness of breath. Negative for cough.   Cardiovascular: Positive for chest pain. Negative for palpitations and leg swelling.  Gastrointestinal: Negative for abdominal pain.  Musculoskeletal: Negative for joint swelling.  Skin: Negative for rash.  Neurological: Negative for headaches.  Psychiatric/Behavioral: Negative for dysphoric mood. The patient is not nervous/anxious.  Physical Exam:  General - No distress ENT - No sinus tenderness, no oral exudate, no LAN, no thyromegaly, TM clear, pupils equal/reactive Cardiac - s1s2 regular, no murmur, pulses symmetric Chest - No wheeze/rales/dullness, good air entry, normal respiratory excursion Back - No focal tenderness Abd - Soft, non-tender, no organomegaly, + bowel sounds Ext - No edema Neuro - Normal strength, cranial nerves intact Skin - No rashes Psych - Normal mood, and behavior   Lab Results  Component Value Date   WBC 6.1 01/23/2012   HGB 10.1* 01/23/2012   HCT 29.5* 01/23/2012   MCV 87.0 01/23/2012   PLT 171 01/23/2012    Lab Results  Component Value Date   CREATININE 0.98 01/23/2012   BUN 20 01/23/2012   NA 135  01/23/2012   K 3.6 01/23/2012   CL 101 01/23/2012   CO2 25 01/23/2012    Lab Results  Component Value Date   ALT 16 01/23/2012   AST 17 01/23/2012   ALKPHOS 73 01/23/2012   BILITOT 0.9 01/23/2012    Lab Results  Component Value Date   TSH 1.120 01/12/2012     Assessment/Plan:  [name], MD [facility] Pager:  [phone]

## 2012-07-08 NOTE — Patient Instructions (Signed)
Will get Chest xray report from Dr. Carolee Rota office Will get medical records from Dr. Lesia Sago office Will arrange for PFT (breathing test) Proair two puffs up to four times per day as needed for cough, wheeze, chest congestion, or shortness of breath Follow up in 3 to 4 weeks

## 2012-07-08 NOTE — Assessment & Plan Note (Signed)
He has persistent dyspnea with exertion.  He was found to have significant CAD and had CABG.  His dyspnea has not improved after recent CABG.    He was noted to have severe LVH and moderate MR on pre-operative Echo.  He had recent cardiac evaluation by Dr. Allyson Sabal.  Will get report from Dr. Hazle Coca office.  Certainly diastolic dysfunction could be contributing to his dyspnea.    He has a family history of emphysema.  I explained that his pre-operative PFT was essentially normal, so it would be very unlikely that he developed COPD/emphysema now.  He has some symptoms suggestive of intermittent asthma.  To further assess will repeat his PFT's with bronchodilator challenge, and will give trial of proair.  Will also get copy of his recent chest xray from PCP office.  He does also report snoring, sleep disruption, and daytime sleepiness.  I am concerned he could have sleep apnea.  This is especially a concern given his history of CAD, HTN, and DM.  He would like to defer further sleep testing for now.  I also explained to him how nocturia can be associated with untreated sleep apnea as well.

## 2012-07-09 ENCOUNTER — Encounter: Payer: Medicare Other | Admitting: Internal Medicine

## 2012-07-10 ENCOUNTER — Ambulatory Visit (HOSPITAL_COMMUNITY): Payer: Medicare Other

## 2012-07-15 ENCOUNTER — Ambulatory Visit (HOSPITAL_COMMUNITY): Payer: Medicare Other

## 2012-07-17 ENCOUNTER — Ambulatory Visit (HOSPITAL_COMMUNITY): Payer: Medicare Other

## 2012-07-22 ENCOUNTER — Ambulatory Visit (HOSPITAL_COMMUNITY): Payer: Medicare Other

## 2012-07-23 ENCOUNTER — Encounter: Payer: Self-pay | Admitting: Cardiovascular Disease

## 2012-07-24 ENCOUNTER — Ambulatory Visit (HOSPITAL_COMMUNITY): Payer: Medicare Other

## 2012-07-25 ENCOUNTER — Ambulatory Visit (AMBULATORY_SURGERY_CENTER): Payer: Medicare PPO | Admitting: Internal Medicine

## 2012-07-25 ENCOUNTER — Encounter: Payer: Self-pay | Admitting: Internal Medicine

## 2012-07-25 ENCOUNTER — Other Ambulatory Visit: Payer: Self-pay | Admitting: Internal Medicine

## 2012-07-25 VITALS — BP 141/58 | HR 68 | Temp 97.9°F | Resp 16 | Ht 75.0 in | Wt 244.0 lb

## 2012-07-25 DIAGNOSIS — D126 Benign neoplasm of colon, unspecified: Secondary | ICD-10-CM

## 2012-07-25 DIAGNOSIS — Z1211 Encounter for screening for malignant neoplasm of colon: Secondary | ICD-10-CM

## 2012-07-25 DIAGNOSIS — Z86010 Personal history of colonic polyps: Secondary | ICD-10-CM

## 2012-07-25 LAB — GLUCOSE, CAPILLARY: Glucose-Capillary: 113 mg/dL — ABNORMAL HIGH (ref 70–99)

## 2012-07-25 MED ORDER — SODIUM CHLORIDE 0.9 % IV SOLN: 500.0000 mL | INTRAVENOUS | Status: DC

## 2012-07-25 NOTE — Progress Notes (Signed)
Lidocaine-40mg IV prior to Propofol InductionPropofol given over incremental dosages 

## 2012-07-25 NOTE — Progress Notes (Signed)
Patient did not experience any of the following events: a burn prior to discharge; a fall within the facility; wrong site/side/patient/procedure/implant event; or a hospital transfer or hospital admission upon discharge from the facility. (G8907) Patient did not have preoperative order for IV antibiotic SSI prophylaxis. (G8918)  

## 2012-07-25 NOTE — Op Note (Signed)
[facility] [address] [place], [address]   COLONOSCOPY PROCEDURE REPORT  PATIENT: Lucas, Morales  MR#: [id number] BIRTHDATE: [birth year] , 78  yrs. old GENDER: Male ENDOSCOPIST: Hart Carwin, MD REFERRED BY:  Romero Liner, M.D. PROCEDURE DATE:  07/25/2012 PROCEDURE:   Colonoscopy with snare polypectomy ASA CLASS:   Class III INDICATIONS: carpeted polep resected by right hemicolectomy in 2006, rwecurrent polyp 2009 MEDICATIONS: MAC sedation, administered by CRNA and propofol (Diprivan) 250mg  IV  DESCRIPTION OF PROCEDURE:   After the risks and benefits and of the procedure were explained, informed consent was obtained.  A digital rectal exam revealed no abnormalities of the rectum.    The LB [id number]  endoscope was introduced through the anus and advanced to the surgical anastomosis .  The quality of the prep was good, using MoviPrep .  The instrument was then slowly withdrawn as the colon was fully examined.     COLON FINDINGS: There was moderate diverticulosis noted throughout the entire examined colon with associated tortuosity, muscular hypertrophy, colonic narrowing and colonic spasm.   A firm sessile polyp ranging between 5-65mm in size was found in the sigmoid colon. A polypectomy was performed with a cold snare.  The resection was complete and the polyp tissue was completely retrieved. Retroflexed views revealed no abnormalities.     The scope was then withdrawn from the patient and the procedure completed.  COMPLICATIONS: There were no complications. ENDOSCOPIC IMPRESSION: 1.   There was moderate diverticulosis noted throughout the entire examined colon 2.   Sessile polyp ranging between 5-5mm in size was found in the sigmoid colon; polypectomy was performed with a cold snare 3. s/p remote right hemicolectomy  RECOMMENDATIONS: 1.  Await pathology results 2.  High fiber diet   REPEAT EXAM: for Colonoscopy, pending biopsy  results.  cc:  _______________________________ eSignedHart Carwin, MD 07/25/2012 2:43 PM     PATIENT NAME:  Lucas, Morales MR#: [id number]

## 2012-07-25 NOTE — Patient Instructions (Signed)
YOU HAD AN ENDOSCOPIC PROCEDURE TODAY AT THE [facility]: Refer to the procedure report that was given to you for any specific questions about what was found during the examination.  If the procedure report does not answer your questions, please call your gastroenterologist to clarify.  If you requested that your care partner not be given the details of your procedure findings, then the procedure report has been included in a sealed envelope for you to review at your convenience later.  YOU SHOULD EXPECT: Some feelings of bloating in the abdomen. Passage of more gas than usual.  Walking can help get rid of the air that was put into your GI tract during the procedure and reduce the bloating. If you had a lower endoscopy (such as a colonoscopy or flexible sigmoidoscopy) you may notice spotting of blood in your stool or on the toilet paper. If you underwent a bowel prep for your procedure, then you may not have a normal bowel movement for a few days.  DIET: Your first meal following the procedure should be a light meal and then it is ok to progress to your normal diet.  A half-sandwich or bowl of soup is an example of a good first meal.  Heavy or fried foods are harder to digest and may make you feel nauseous or bloated.  Likewise meals heavy in dairy and vegetables can cause extra gas to form and this can also increase the bloating.  Drink plenty of fluids but you should avoid alcoholic beverages for 24 hours.  ACTIVITY: Your care partner should take you home directly after the procedure.  You should plan to take it easy, moving slowly for the rest of the day.  You can resume normal activity the day after the procedure however you should NOT DRIVE or use heavy machinery for 24 hours (because of the sedation medicines used during the test).    SYMPTOMS TO REPORT IMMEDIATELY: A gastroenterologist can be reached at any hour.  During normal business hours, 8:30 AM to 5:00 PM Monday through Friday,  call [phone].  After hours and on weekends, please call the GI answering service at [phone] who will take a message and have the physician on call contact you.   Following lower endoscopy (colonoscopy or flexible sigmoidoscopy):  Excessive amounts of blood in the stool  Significant tenderness or worsening of abdominal pains  Swelling of the abdomen that is new, acute  Fever of 100F or higher    FOLLOW UP: If any biopsies were taken you will be contacted by phone or by letter within the next 1-3 weeks.  Call your gastroenterologist if you have not heard about the biopsies in 3 weeks.  Our staff will call the home number listed on your records the next business day following your procedure to check on you and address any questions or concerns that you may have at that time regarding the information given to you following your procedure. This is a courtesy call and so if there is no answer at the home number and we have not heard from you through the emergency physician on call, we will assume that you have returned to your regular daily activities without incident.  SIGNATURES/CONFIDENTIALITY: You and/or your care partner have signed paperwork which will be entered into your electronic medical record.  These signatures attest to the fact that that the information above on your After Visit Summary has been reviewed and is understood.  Full responsibility of the confidentiality   of this discharge information lies with you and/or your care-partner.     

## 2012-07-26 ENCOUNTER — Telehealth: Payer: Self-pay | Admitting: *Deleted

## 2012-07-26 NOTE — Telephone Encounter (Signed)
  Follow up Call-  Call back number 07/25/2012  Post procedure Call Back phone  # [phone]  Permission to leave phone message Yes     Patient questions:  Do you have a fever, pain , or abdominal swelling? no Pain Score  0 *  Have you tolerated food without any problems? yes  Have you been able to return to your normal activities? yes  Do you have any questions about your discharge instructions: Diet   no Medications  no Follow up visit  no  Do you have questions or concerns about your Care? yes  Actions: * If pain score is 4 or above: No action needed, pain <4.  Pt. Had some bleeding with BM last evening.  Did not note any further bleeding after movement.  Encouraged to watch for additional or increased bleeding and to call office back today if bleeding reoccurs.

## 2012-07-31 ENCOUNTER — Encounter: Payer: Self-pay | Admitting: Internal Medicine

## 2012-08-13 ENCOUNTER — Ambulatory Visit: Payer: Medicare PPO | Admitting: Pulmonary Disease

## 2012-08-16 ENCOUNTER — Ambulatory Visit: Payer: Medicare PPO | Admitting: Internal Medicine

## 2012-09-18 ENCOUNTER — Ambulatory Visit (INDEPENDENT_AMBULATORY_CARE_PROVIDER_SITE_OTHER): Payer: Medicare PPO | Admitting: Pulmonary Disease

## 2012-09-18 DIAGNOSIS — R0602 Shortness of breath: Secondary | ICD-10-CM

## 2012-09-18 LAB — PULMONARY FUNCTION TEST

## 2012-09-18 NOTE — Progress Notes (Signed)
PFT done today. 

## 2012-09-24 ENCOUNTER — Telehealth: Payer: Self-pay | Admitting: Pulmonary Disease

## 2012-09-24 NOTE — Telephone Encounter (Signed)
PFT 09/18/12 >> FEV1 3.47 (101%), FEV1% 77, TLC 7.34 (95%), DLCO 60%, no BD  Will have my nurse schedule ROV to review results.

## 2012-09-24 NOTE — Telephone Encounter (Signed)
Pt has been scheduled for 10/21/12 at 2:15pm.

## 2012-09-26 ENCOUNTER — Encounter (INDEPENDENT_AMBULATORY_CARE_PROVIDER_SITE_OTHER): Payer: Medicare PPO | Admitting: Ophthalmology

## 2012-09-26 DIAGNOSIS — H35349 Macular cyst, hole, or pseudohole, unspecified eye: Secondary | ICD-10-CM

## 2012-09-26 DIAGNOSIS — H35039 Hypertensive retinopathy, unspecified eye: Secondary | ICD-10-CM

## 2012-09-26 DIAGNOSIS — H43819 Vitreous degeneration, unspecified eye: Secondary | ICD-10-CM

## 2012-09-26 DIAGNOSIS — I10 Essential (primary) hypertension: Secondary | ICD-10-CM

## 2012-09-26 NOTE — H&P (Signed)
Lucas Morales is an 79 y.o. male.   Chief Complaint:loss of reading vision right eye HPI: Cataract surgery two years ago, now macular hole right eye  Past Medical History  Diagnosis Date  . Adenomatous colon polyp   . Hemorrhoids   . Diverticulosis   . Gastritis   . IC (interstitial cystitis)   . Esophageal stricture   . Diverticulitis   . Hyperlipidemia     doesn't require meds  . Anginal pain 01/11/2012  . Bronchitis     "I've had it a few times" (01/11/2012)  . Pre-diabetes     "check my sugars q 3 days" (01/11/2012)  . Hiatal hernia   . Arthritis     "knees" (01/11/2012)  . Gout   . Dysrhythmia     bradycardia in the 30's (01/11/2012)  . Coronary artery disease   . Hypertension     takes Losartan daily  . Diabetes mellitus without complication     "borderline"  . Shortness of breath 01/11/2012    pt states all of the time  . History of bronchitis     last time about 3-46yrs ago  . Joint pain   . Joint swelling   . Gout     takes ALlopurinol daily  . GERD (gastroesophageal reflux disease)     takes Pantoprazole daily  . History of colon polyps   . Urinary frequency   . Interstitial cystitis   . Depression     does't require any meds  . Nocturia   . Insomnia     takes Nortrypyline nightly  . Esophageal dysmotility     Past Surgical History  Procedure Laterality Date  . Reconstruction medial collateral ligament elbow w/ tendon graft  1988 X 2    right  . Total knee arthroplasty  ~ 2001; 2011    right; left  . Bladder lesion removed    . Exploratory laparotomy  2009    with right colectomy  . Tonsillectomy and adenoidectomy  ~1943  . Appendectomy  2009  . Cholecystectomy  2009  . Lumbar disc surgery  ?2010    "cleaned out the stenosis" (01/11/2012)  . Cystoscopy with ureteroscopy  2008; 2009    "painted inside of bladder wall"  . Cataract extraction w/ intraocular lens  implant, bilateral  2011  . Tumor excision  12/2011    left arm  . Elbow  surgery  1988    right  . Joint replacement  90's and 2000's    bil knee  . Colectomy      d/t diverticulosis  . Heel spurs removed  25+yrs ago    bil   . Cardiac catheterization  01/11/2012  . Colonoscopy    . Esophagogastroduodenoscopy    . Coronary artery bypass graft  01/18/2012    Procedure: CORONARY ARTERY BYPASS GRAFTING (CABG);  Surgeon: Kerin Perna, MD;  Location: [facility] OR;  Service: Open Heart Surgery;  Laterality: N/A;  times four, using left internal mammary artery  . Endovein harvest of greater saphenous vein  01/18/2012    Procedure: ENDOVEIN HARVEST OF GREATER SAPHENOUS VEIN;  Surgeon: Kerin Perna, MD;  Location: [facility] OR;  Service: Open Heart Surgery;  Laterality: Right;    Family History  Problem Relation Age of Onset  . Colon cancer Neg Hx   . Kidney disease Father   . Emphysema Mother   . Emphysema Maternal Grandfather    Social History:  reports that he has never smoked. He  has never used smokeless tobacco. He reports that he does not drink alcohol or use illicit drugs.  Allergies:  Allergies  Allergen Reactions  . Morphine Itching    No prescriptions prior to admission    Review of systems otherwise negative  There were no vitals taken for this visit.  Physical exam: Mental status: oriented x3. Eyes: See eye exam associated with this date of surgery in media tab.  Scanned in by scanning center Ears, Nose, Throat: within normal limits Neck: Within Normal limits General: within normal limits Chest: Within normal limits Breast: deferred Heart: Within normal limits Abdomen: Within normal limits GU: deferred Extremities: within normal limits Skin: within normal limits  Assessment/Plan Macular hole, right eye Plan: To [facility] for pars plana vitrectomy, membrane peel, laser treatment, gas injection, serum patch.  right eye  [name], [name] 09/26/2012, 3:58 PM

## 2012-09-30 ENCOUNTER — Ambulatory Visit (INDEPENDENT_AMBULATORY_CARE_PROVIDER_SITE_OTHER): Payer: Medicare PPO | Admitting: Physician Assistant

## 2012-09-30 ENCOUNTER — Encounter: Payer: Self-pay | Admitting: Physician Assistant

## 2012-09-30 VITALS — BP 112/60 | HR 84 | Ht 74.0 in | Wt 247.8 lb

## 2012-09-30 DIAGNOSIS — Z01818 Encounter for other preprocedural examination: Secondary | ICD-10-CM

## 2012-09-30 DIAGNOSIS — Z951 Presence of aortocoronary bypass graft: Secondary | ICD-10-CM

## 2012-09-30 DIAGNOSIS — E785 Hyperlipidemia, unspecified: Secondary | ICD-10-CM

## 2012-09-30 DIAGNOSIS — I10 Essential (primary) hypertension: Secondary | ICD-10-CM

## 2012-09-30 DIAGNOSIS — I2510 Atherosclerotic heart disease of native coronary artery without angina pectoris: Secondary | ICD-10-CM

## 2012-09-30 NOTE — Assessment & Plan Note (Signed)
Currently treated with Lipitor 

## 2012-09-30 NOTE — Patient Instructions (Signed)
Follow up with Dr. Berry in six months.   

## 2012-09-30 NOTE — Progress Notes (Signed)
Date:  09/30/2012   ID:  Lucas Morales, DOB 1933-12-17, MRN [id number]  PCP:  Lucas Moh, MD  Primary Cardiologist:  Lucas Morales    History of Present Illness: Lucas Morales is a 79 y.o. male history of coronary artery disease, hypertension, hyperlipidemia, diabetes mellitus. He coronary artery bypass grafting x4 by Dr. Donata Clay  October 31st 2013.  With a LIMA to the LAD, vein to the distal LAD, vein to the obtuse marginal branch 3 branch 4. He had a problem with paroxysmal atrial fibrillation was placed on Coumadin, digoxin and amiodarone. He subsequent wore a MCOT device which showed no evidence of PAF and as a result the amiodarone, Coumadin and digoxin were discontinued by Dr. Allyson Morales.  Patient said that consistent ongoing sharp chest pain which is 1-2/10 in intensity. He had a nuclear stress test wrist completed January 21 of this year which was negative for skin with an ejection fraction of 51% with mild apical hypocontractility. His last 2-D echocardiogram was 01/04/2012 ejection fraction was greater than 55% with moderate to severe concentric LVH, impaired LV relaxation, moderate to severe dilated left atrium, moderate MR and mild TR.  The patient presents today for a preoperative clearance for ophthalmologic surgery which is scheduled for 10/08/2012. He can continues to complain of the mild chest pain which is sharp radiating to his back as he did 6 months ago he also notes occasional diaphoresis, shortness of breath which is chronic however he does not report any correlation between the 3 separate symptoms.  The patient currently denies nausea, vomiting, fever, shortness of breath, orthopnea, dizziness, PND, cough, congestion, abdominal pain, hematochezia, melena, lower extremity edema, claudication.  Wt Readings from Last 3 Encounters:  09/30/12 247 lb 12.8 oz (112.401 kg)  07/25/12 244 lb (110.678 kg)  07/08/12 248 lb (112.492 kg)     Past Medical History  Diagnosis  Date  . Adenomatous colon polyp   . Hemorrhoids   . Diverticulosis   . Gastritis   . IC (interstitial cystitis)   . Esophageal stricture   . Diverticulitis   . Hyperlipidemia     doesn't require meds  . Anginal pain 01/11/2012  . Bronchitis     "I've had it a few times" (01/11/2012)  . Pre-diabetes     "check my sugars q 3 days" (01/11/2012)  . Hiatal hernia   . Arthritis     "knees" (01/11/2012)  . Gout   . Dysrhythmia     bradycardia in the 30's (01/11/2012)  . Coronary artery disease   . Hypertension     takes Losartan daily  . Diabetes mellitus without complication     "borderline"  . Shortness of breath 01/11/2012    pt states all of the time  . History of bronchitis     last time about 3-102yrs ago  . Joint pain   . Joint swelling   . Gout     takes ALlopurinol daily  . GERD (gastroesophageal reflux disease)     takes Pantoprazole daily  . History of colon polyps   . Urinary frequency   . Interstitial cystitis   . Depression     does't require any meds  . Nocturia   . Insomnia     takes Nortrypyline nightly  . Esophageal dysmotility     Current Outpatient Prescriptions  Medication Sig Dispense Refill  . albuterol (PROAIR HFA) 108 (90 BASE) MCG/ACT inhaler Inhale 2 puffs into the lungs every 6 (six) hours as  needed for wheezing or shortness of breath.  1 Inhaler  5  . aspirin EC 325 MG EC tablet Take 1 tablet (325 mg total) by mouth daily.  30 tablet    . atorvastatin (LIPITOR) 40 MG tablet Take 1 tablet (40 mg total) by mouth daily at 6 PM.  30 tablet  1  . calcium carbonate (TUMS EX) 750 MG chewable tablet Chew 2 tablets by mouth daily.       [name] desmopressin (DDAVP) 0.01 % SOLN Place 1 spray into the nose daily.      [name] loperamide (IMODIUM) 2 MG capsule Take 2 mg by mouth as needed.       [name] losartan (COZAAR) 50 MG tablet Take 1 tablet (50 mg total) by mouth daily.  30 tablet  1  . metFORMIN (GLUCOPHAGE) 500 MG tablet Take 1 tablet (500 mg total) by mouth 2  (two) times daily with a meal.  60 tablet  1  . metoprolol tartrate (LOPRESSOR) 25 MG tablet Take 1 tablet (25 mg total) by mouth 2 (two) times daily.  60 tablet  1  . Multiple Vitamins-Minerals (MULTIVITAMIN WITH MINERALS) tablet Take 1 tablet by mouth daily.        . naproxen (NAPROSYN) 500 MG tablet Take 500 mg by mouth as needed. Take 3 pills prn gout attack      . nortriptyline (PAMELOR) 10 MG capsule Take 10 mg by mouth at bedtime. Take 10 to 30 mg for sleep      . OVER THE COUNTER MEDICATION Fiber Well--2 tabs daily      . pantoprazole (PROTONIX) 40 MG tablet Take 40 mg by mouth 2 (two) times daily.       . tadalafil (CIALIS) 10 MG tablet Take 10 mg by mouth daily as needed. Take daily for interstitial cystitis       No current facility-administered medications for this visit.    Allergies:    Allergies  Allergen Reactions  . Morphine Itching    Social History:  The patient  reports that he has never smoked. He has never used smokeless tobacco. He reports that  drinks alcohol. He reports that he does not use illicit drugs.   Family history:   Family History  Problem Relation Age of Onset  . Colon cancer Neg Hx   . Kidney disease Father   . Emphysema Mother   . Emphysema Maternal Grandfather     ROS:  Please see the history of present illness.  All other systems reviewed and negative.   PHYSICAL EXAM: VS:  BP 112/60  Pulse 84  Ht 6\' 2"  (1.88 m)  Wt 247 lb 12.8 oz (112.401 kg)  BMI 31.8 kg/m2 Well nourished, well developed, in no acute distress HEENT: Pupils are equal round react to light accommodation extraocular movements are intact.  Neck: no JVDNo cervical lymphadenopathy. Cardiac: Regular rate and rhythm without murmurs rubs or gallops. Lungs:  clear to auscultation bilaterally, no wheezing, rhonchi or rales Abd: soft, nontender, positive bowel sounds all quadrants, no hepatosplenomegaly Ext: no lower extremity edema.  2+ radial and dorsalis pedis pulses. Skin:  warm and dry Neuro:  Grossly normal  EKG:  Normal sinus rhythm inferior Q waves, rate 84 beats per minute. No change since prior     ASSESSMENT AND PLAN:  Problem List Items Addressed This Visit   S/P CABG x 4, elective, 01/18/12   Preoperative clearance     Patient is low risk for low risk procedure. A.  status post coronary bypass grafting x4 October 2013.  Negative nuclear stress test January 2014 ejection fraction 55% last echo.    Hypertension (Chronic)     Blood pressure well controlled at this time.    Hyperlipidemia (Chronic)     Currently treated with Lipitor.    CAD -- mLAD 80&-60%, OM2 90%, OM3 70-80%, RPDA 90%, cath 01/11/12 - Primary   Relevant Orders      EKG 12-Lead

## 2012-09-30 NOTE — Assessment & Plan Note (Signed)
Blood pressure well controlled at this time. 

## 2012-09-30 NOTE — Assessment & Plan Note (Signed)
Patient is low risk for low risk procedure. A. status post coronary bypass grafting x4 October 2013.  Negative nuclear stress test January 2014 ejection fraction 55% last echo.

## 2012-10-01 ENCOUNTER — Encounter (HOSPITAL_COMMUNITY): Payer: Self-pay | Admitting: Pharmacy Technician

## 2012-10-06 ENCOUNTER — Other Ambulatory Visit: Payer: Self-pay | Admitting: Cardiovascular Disease

## 2012-10-07 ENCOUNTER — Telehealth: Payer: Self-pay | Admitting: Pulmonary Disease

## 2012-10-07 ENCOUNTER — Encounter (HOSPITAL_COMMUNITY): Payer: Self-pay | Admitting: *Deleted

## 2012-10-07 MED ORDER — CEFAZOLIN SODIUM-DEXTROSE 2-3 GM-% IV SOLR
2.0000 g | INTRAVENOUS | Status: AC
  Administered 2012-10-08: 2 g via INTRAVENOUS
  Filled 2012-10-07: qty 50

## 2012-10-07 NOTE — Telephone Encounter (Signed)
Spoke with the pt and notified of recs per VS- he confirmed that he is having eye surgery Pt verbalized understanding and states nothing further needed

## 2012-10-07 NOTE — Telephone Encounter (Signed)
In review of Epic records it appears he is having eye surgery >> please confirm with patient.  If he is having eye surgery, then his PFT results do not indicate a concern for increased per-operative pulmonary risk.

## 2012-10-07 NOTE — Telephone Encounter (Signed)
PFT results per phone note: PFT 09/18/12 >> FEV1 3.47 (101%), FEV1% 77, TLC 7.34 (95%), DLCO 60%, no BD  Pt states he is having surgery tomorrow and he wants to know are there any concerns based on the PFT about him having general anesthesia? Please advise. Carron Curie, CMA

## 2012-10-08 ENCOUNTER — Encounter (HOSPITAL_COMMUNITY): Payer: Self-pay | Admitting: Anesthesiology

## 2012-10-08 ENCOUNTER — Ambulatory Visit (HOSPITAL_COMMUNITY): Payer: Medicare PPO | Admitting: Anesthesiology

## 2012-10-08 ENCOUNTER — Encounter (HOSPITAL_COMMUNITY)
Admission: RE | Disposition: A | Discharge status: Home / Self Care | Payer: Self-pay | Source: Ambulatory Visit | Attending: Ophthalmology

## 2012-10-08 ENCOUNTER — Ambulatory Visit (HOSPITAL_COMMUNITY)
Admission: RE | Admit: 2012-10-08 | Discharge: 2012-10-09 | Disposition: A | Discharge status: Home / Self Care | Payer: Medicare PPO | Source: Ambulatory Visit | Attending: Ophthalmology | Admitting: Ophthalmology

## 2012-10-08 DIAGNOSIS — H35349 Macular cyst, hole, or pseudohole, unspecified eye: Secondary | ICD-10-CM | POA: Diagnosis present

## 2012-10-08 DIAGNOSIS — H35341 Macular cyst, hole, or pseudohole, right eye: Secondary | ICD-10-CM

## 2012-10-08 HISTORY — PX: 25 GAUGE PARS PLANA VITRECTOMY WITH 20 GAUGE MVR PORT FOR MACULAR HOLE: SHX6096

## 2012-10-08 LAB — GLUCOSE, CAPILLARY
Glucose-Capillary: 121 mg/dL — ABNORMAL HIGH (ref 70–99)
Glucose-Capillary: 164 mg/dL — ABNORMAL HIGH (ref 70–99)

## 2012-10-08 LAB — AUTOLOGOUS SERUM PATCH PREP

## 2012-10-08 LAB — BASIC METABOLIC PANEL
CO2: 23 mEq/L (ref 19–32)
Chloride: 104 mEq/L (ref 96–112)
Creatinine, Ser: 1.14 mg/dL (ref 0.50–1.35)
GFR calc Af Amer: 70 mL/min — ABNORMAL LOW (ref >90)
Sodium: 139 mEq/L (ref 135–145)

## 2012-10-08 LAB — CBC
MCV: 88.7 fL (ref 78.0–100.0)
Platelets: 126 10*3/uL — ABNORMAL LOW (ref 150–400)
RBC: 4.15 MIL/uL — ABNORMAL LOW (ref 4.22–5.81)
RDW: 14.7 % (ref 11.5–15.5)
WBC: 3.2 10*3/uL — ABNORMAL LOW (ref 4.0–10.5)

## 2012-10-08 SURGERY — 25 GAUGE PARS PLANA VITRECTOMY WITH 20 GAUGE MVR PORT FOR MACULAR HOLE
Anesthesia: General | Site: Eye | Laterality: Right | Wound class: Clean

## 2012-10-08 MED ORDER — HYDROCODONE-ACETAMINOPHEN 5-325 MG PO TABS
1.0000 | ORAL_TABLET | ORAL | Status: DC | PRN
  Administered 2012-10-08 (×2): 2 via ORAL
  Filled 2012-10-08 (×2): qty 2

## 2012-10-08 MED ORDER — EPINEPHRINE HCL 1 MG/ML IJ SOLN
INTRAMUSCULAR | Status: AC
  Filled 2012-10-08: qty 1

## 2012-10-08 MED ORDER — SODIUM CHLORIDE 0.9 % IV SOLN
INTRAVENOUS | Status: DC
  Administered 2012-10-08: 12:00:00 via INTRAVENOUS
  Administered 2012-10-08: 10 mL/h via INTRAVENOUS
  Administered 2012-10-08: 12:00:00 via INTRAVENOUS

## 2012-10-08 MED ORDER — HYDROMORPHONE HCL PF 1 MG/ML IJ SOLN
INTRAMUSCULAR | Status: AC
  Filled 2012-10-08: qty 1

## 2012-10-08 MED ORDER — ACETAZOLAMIDE SODIUM 500 MG IJ SOLR
500.0000 mg | Freq: Once | INTRAMUSCULAR | Status: AC
  Administered 2012-10-09: 500 mg via INTRAVENOUS
  Filled 2012-10-08: qty 500

## 2012-10-08 MED ORDER — CYCLOPENTOLATE HCL 1 % OP SOLN
1.0000 [drp] | OPHTHALMIC | Status: AC | PRN
  Administered 2012-10-08 (×3): 1 [drp] via OPHTHALMIC
  Filled 2012-10-08: qty 2

## 2012-10-08 MED ORDER — SODIUM HYALURONATE 10 MG/ML IO SOLN
INTRAOCULAR | Status: DC | PRN
  Administered 2012-10-08: 0.85 mL via INTRAOCULAR

## 2012-10-08 MED ORDER — HYPROMELLOSE (GONIOSCOPIC) 2.5 % OP SOLN
OPHTHALMIC | Status: AC
  Filled 2012-10-08: qty 15

## 2012-10-08 MED ORDER — FENTANYL CITRATE 0.05 MG/ML IJ SOLN
INTRAMUSCULAR | Status: DC | PRN
  Administered 2012-10-08: 100 ug via INTRAVENOUS

## 2012-10-08 MED ORDER — TETRACAINE HCL 0.5 % OP SOLN
2.0000 [drp] | Freq: Once | OPHTHALMIC | Status: DC
  Filled 2012-10-08: qty 2

## 2012-10-08 MED ORDER — BUPIVACAINE HCL (PF) 0.75 % IJ SOLN
INTRAMUSCULAR | Status: AC
  Filled 2012-10-08: qty 10

## 2012-10-08 MED ORDER — DOCUSATE SODIUM 100 MG PO CAPS
100.0000 mg | ORAL_CAPSULE | Freq: Two times a day (BID) | ORAL | Status: DC
  Filled 2012-10-08: qty 1

## 2012-10-08 MED ORDER — PHENYLEPHRINE HCL 2.5 % OP SOLN
1.0000 [drp] | OPHTHALMIC | Status: AC | PRN
  Administered 2012-10-08 (×3): 1 [drp] via OPHTHALMIC

## 2012-10-08 MED ORDER — MINERAL OIL LIGHT 100 % EX OIL
TOPICAL_OIL | CUTANEOUS | Status: AC
  Filled 2012-10-08: qty 25

## 2012-10-08 MED ORDER — DEXAMETHASONE SODIUM PHOSPHATE 10 MG/ML IJ SOLN
INTRAMUSCULAR | Status: AC
  Filled 2012-10-08: qty 1

## 2012-10-08 MED ORDER — ATORVASTATIN CALCIUM 40 MG PO TABS
40.0000 mg | ORAL_TABLET | Freq: Every day | ORAL | Status: DC
  Administered 2012-10-08: 40 mg via ORAL
  Filled 2012-10-08 (×3): qty 1

## 2012-10-08 MED ORDER — MULTI-VITAMIN/MINERALS PO TABS: 1.0000 | ORAL_TABLET | Freq: Every day | ORAL | Status: DC

## 2012-10-08 MED ORDER — BRIMONIDINE TARTRATE 0.2 % OP SOLN
1.0000 [drp] | Freq: Two times a day (BID) | OPHTHALMIC | Status: DC
Start: 2012-10-09 — End: 2012-10-09
  Filled 2012-10-08: qty 5

## 2012-10-08 MED ORDER — LOSARTAN POTASSIUM 50 MG PO TABS
50.0000 mg | ORAL_TABLET | Freq: Every day | ORAL | Status: DC
  Administered 2012-10-08: 50 mg via ORAL
  Filled 2012-10-08 (×2): qty 1

## 2012-10-08 MED ORDER — MORPHINE SULFATE 2 MG/ML IJ SOLN
1.0000 mg | INTRAMUSCULAR | Status: DC | PRN
  Administered 2012-10-08: 4 mg via INTRAVENOUS
  Filled 2012-10-08: qty 2

## 2012-10-08 MED ORDER — STERILE WATER FOR IRRIGATION IR SOLN
Status: DC | PRN
  Administered 2012-10-08: 200 mL

## 2012-10-08 MED ORDER — ACETAMINOPHEN 325 MG PO TABS: 325.0000 mg | ORAL_TABLET | ORAL | Status: DC | PRN

## 2012-10-08 MED ORDER — 0.9 % SODIUM CHLORIDE (POUR BTL) OPTIME
TOPICAL | Status: DC | PRN
  Administered 2012-10-08: 1000 mL

## 2012-10-08 MED ORDER — ADULT MULTIVITAMIN W/MINERALS CH
1.0000 | ORAL_TABLET | Freq: Every day | ORAL | Status: DC
  Filled 2012-10-08: qty 1

## 2012-10-08 MED ORDER — PROPOFOL 10 MG/ML IV BOLUS
INTRAVENOUS | Status: DC | PRN
  Administered 2012-10-08: 120 mg via INTRAVENOUS

## 2012-10-08 MED ORDER — GATIFLOXACIN 0.5 % OP SOLN
1.0000 [drp] | OPHTHALMIC | Status: AC | PRN
  Administered 2012-10-08 (×3): 1 [drp] via OPHTHALMIC
  Filled 2012-10-08: qty 2.5

## 2012-10-08 MED ORDER — SODIUM HYALURONATE 10 MG/ML IO SOLN
INTRAOCULAR | Status: AC
  Filled 2012-10-08: qty 0.85

## 2012-10-08 MED ORDER — BACITRACIN-POLYMYXIN B 500-10000 UNIT/GM OP OINT
TOPICAL_OINTMENT | OPHTHALMIC | Status: AC
  Filled 2012-10-08: qty 3.5

## 2012-10-08 MED ORDER — TEMAZEPAM 15 MG PO CAPS: 15.0000 mg | ORAL_CAPSULE | Freq: Every evening | ORAL | Status: DC | PRN

## 2012-10-08 MED ORDER — TROPICAMIDE 1 % OP SOLN
1.0000 [drp] | OPHTHALMIC | Status: AC | PRN
  Administered 2012-10-08 (×3): 1 [drp] via OPHTHALMIC
  Filled 2012-10-08: qty 3

## 2012-10-08 MED ORDER — NORTRIPTYLINE HCL 10 MG PO CAPS
10.0000 mg | ORAL_CAPSULE | Freq: Every day | ORAL | Status: DC
  Administered 2012-10-08: 10 mg via ORAL
  Filled 2012-10-08 (×2): qty 1

## 2012-10-08 MED ORDER — ACETAZOLAMIDE SODIUM 500 MG IJ SOLR
INTRAMUSCULAR | Status: AC
  Filled 2012-10-08: qty 500

## 2012-10-08 MED ORDER — EPINEPHRINE HCL 1 MG/ML IJ SOLN
INTRAOCULAR | Status: DC | PRN
  Administered 2012-10-08: 11:00:00

## 2012-10-08 MED ORDER — SODIUM CHLORIDE 0.9 % IJ SOLN
INTRAMUSCULAR | Status: DC | PRN
  Administered 2012-10-08: 11:00:00

## 2012-10-08 MED ORDER — GATIFLOXACIN 0.5 % OP SOLN
1.0000 [drp] | Freq: Four times a day (QID) | OPHTHALMIC | Status: DC
  Filled 2012-10-08: qty 2.5

## 2012-10-08 MED ORDER — TRIAMCINOLONE ACETONIDE 40 MG/ML IJ SUSP
INTRAMUSCULAR | Status: DC | PRN
  Administered 2012-10-08: 40 mg via INTRAMUSCULAR

## 2012-10-08 MED ORDER — MAGNESIUM HYDROXIDE 400 MG/5ML PO SUSP
15.0000 mL | Freq: Four times a day (QID) | ORAL | Status: DC | PRN
  Administered 2012-10-09: 30 mL via ORAL
  Filled 2012-10-08: qty 30

## 2012-10-08 MED ORDER — ATROPINE SULFATE 1 % OP SOLN
OPHTHALMIC | Status: DC | PRN
  Administered 2012-10-08: 1 [drp] via OPHTHALMIC

## 2012-10-08 MED ORDER — LOPERAMIDE HCL 2 MG PO CAPS
6.0000 mg | ORAL_CAPSULE | Freq: Every day | ORAL | Status: DC
  Administered 2012-10-08: 6 mg via ORAL
  Filled 2012-10-08 (×2): qty 3

## 2012-10-08 MED ORDER — TRIAMCINOLONE ACETONIDE 40 MG/ML IJ SUSP
INTRAMUSCULAR | Status: AC
  Filled 2012-10-08: qty 5

## 2012-10-08 MED ORDER — ROCURONIUM BROMIDE 100 MG/10ML IV SOLN
INTRAVENOUS | Status: DC | PRN
  Administered 2012-10-08: 50 mg via INTRAVENOUS

## 2012-10-08 MED ORDER — DEXAMETHASONE SODIUM PHOSPHATE 10 MG/ML IJ SOLN
INTRAMUSCULAR | Status: DC | PRN
  Administered 2012-10-08: 10 mg via INTRAVENOUS

## 2012-10-08 MED ORDER — MIDAZOLAM HCL 5 MG/5ML IJ SOLN
INTRAMUSCULAR | Status: DC | PRN
  Administered 2012-10-08: 2 mg via INTRAVENOUS

## 2012-10-08 MED ORDER — ASPIRIN EC 325 MG PO TBEC
325.0000 mg | DELAYED_RELEASE_TABLET | Freq: Every day | ORAL | Status: DC
  Filled 2012-10-08: qty 1

## 2012-10-08 MED ORDER — EPINEPHRINE HCL 1 MG/ML IJ SOLN: INTRAOCULAR | Status: DC | PRN

## 2012-10-08 MED ORDER — SODIUM CHLORIDE 0.45 % IV SOLN
INTRAVENOUS | Status: DC
  Administered 2012-10-08: 16:00:00 via INTRAVENOUS

## 2012-10-08 MED ORDER — INSULIN ASPART 100 UNIT/ML ~~LOC~~ SOLN
0.0000 [IU] | SUBCUTANEOUS | Status: DC
  Administered 2012-10-08: 3 [IU] via SUBCUTANEOUS
  Administered 2012-10-08 (×2): 5 [IU] via SUBCUTANEOUS
  Administered 2012-10-09: 3 [IU] via SUBCUTANEOUS
  Administered 2012-10-09: 5 [IU] via SUBCUTANEOUS

## 2012-10-08 MED ORDER — GLYCOPYRROLATE 0.2 MG/ML IJ SOLN
INTRAMUSCULAR | Status: DC | PRN
  Administered 2012-10-08: 0.6 mg via INTRAVENOUS

## 2012-10-08 MED ORDER — BUPIVACAINE HCL (PF) 0.75 % IJ SOLN
INTRAMUSCULAR | Status: DC | PRN
  Administered 2012-10-08: 10 mL

## 2012-10-08 MED ORDER — POLYMYXIN B SULFATE 500000 UNITS IJ SOLR
INTRAMUSCULAR | Status: AC
  Filled 2012-10-08: qty 1

## 2012-10-08 MED ORDER — METOPROLOL TARTRATE 25 MG PO TABS
25.0000 mg | ORAL_TABLET | Freq: Two times a day (BID) | ORAL | Status: DC
  Administered 2012-10-08: 25 mg via ORAL
  Filled 2012-10-08 (×3): qty 1

## 2012-10-08 MED ORDER — CALCIUM CARBONATE ANTACID 750 MG PO CHEW: 3.0000 | CHEWABLE_TABLET | Freq: Every day | ORAL | Status: DC

## 2012-10-08 MED ORDER — PANTOPRAZOLE SODIUM 40 MG PO TBEC
40.0000 mg | DELAYED_RELEASE_TABLET | Freq: Two times a day (BID) | ORAL | Status: DC
  Administered 2012-10-08 – 2012-10-09 (×2): 40 mg via ORAL
  Filled 2012-10-08 (×2): qty 1

## 2012-10-08 MED ORDER — ONDANSETRON HCL 4 MG/2ML IJ SOLN
INTRAMUSCULAR | Status: DC | PRN
  Administered 2012-10-08: 4 mg via INTRAVENOUS

## 2012-10-08 MED ORDER — HYDROMORPHONE HCL PF 1 MG/ML IJ SOLN
0.2500 mg | INTRAMUSCULAR | Status: DC | PRN
  Administered 2012-10-08 (×4): 0.5 mg via INTRAVENOUS

## 2012-10-08 MED ORDER — PREDNISOLONE ACETATE 1 % OP SUSP
1.0000 [drp] | Freq: Four times a day (QID) | OPHTHALMIC | Status: DC
  Filled 2012-10-08: qty 1

## 2012-10-08 MED ORDER — ONDANSETRON HCL 4 MG/2ML IJ SOLN
INTRAMUSCULAR | Status: AC
  Filled 2012-10-08: qty 2

## 2012-10-08 MED ORDER — METOPROLOL TARTRATE 12.5 MG HALF TABLET
ORAL_TABLET | ORAL | Status: AC
  Administered 2012-10-08: 25 mg
  Filled 2012-10-08: qty 2

## 2012-10-08 MED ORDER — NEOSTIGMINE METHYLSULFATE 1 MG/ML IJ SOLN
INTRAMUSCULAR | Status: DC | PRN
  Administered 2012-10-08: 4 mg via INTRAVENOUS

## 2012-10-08 MED ORDER — CALCIUM CARBONATE ANTACID 500 MG PO CHEW
4.0000 | CHEWABLE_TABLET | Freq: Every day | ORAL | Status: DC
  Filled 2012-10-08: qty 4

## 2012-10-08 MED ORDER — LIDOCAINE HCL 2 % IJ SOLN
INTRAMUSCULAR | Status: AC
  Filled 2012-10-08: qty 20

## 2012-10-08 MED ORDER — BSS IO SOLN
INTRAOCULAR | Status: AC
  Filled 2012-10-08: qty 15

## 2012-10-08 MED ORDER — ATROPINE SULFATE 1 % OP SOLN
OPHTHALMIC | Status: AC
  Filled 2012-10-08: qty 2

## 2012-10-08 MED ORDER — ONDANSETRON HCL 4 MG/2ML IJ SOLN
4.0000 mg | Freq: Once | INTRAMUSCULAR | Status: AC | PRN
  Administered 2012-10-08: 4 mg via INTRAVENOUS

## 2012-10-08 MED ORDER — DESMOPRESSIN ACE SPRAY REFRIG 0.01 % NA SOLN
1.0000 | Freq: Every day | NASAL | Status: DC
  Administered 2012-10-08: 1 via NASAL
  Filled 2012-10-08: qty 5

## 2012-10-08 MED ORDER — PHENYLEPHRINE HCL 2.5 % OP SOLN
OPHTHALMIC | Status: AC
  Filled 2012-10-08: qty 2

## 2012-10-08 MED ORDER — ALBUTEROL SULFATE HFA 108 (90 BASE) MCG/ACT IN AERS
2.0000 | INHALATION_SPRAY | Freq: Four times a day (QID) | RESPIRATORY_TRACT | Status: DC | PRN
Start: 2012-10-08 — End: 2012-10-09

## 2012-10-08 MED ORDER — BACITRACIN-POLYMYXIN B 500-10000 UNIT/GM OP OINT
TOPICAL_OINTMENT | OPHTHALMIC | Status: DC | PRN
  Administered 2012-10-08: 1 via OPHTHALMIC

## 2012-10-08 MED ORDER — GENTAMICIN SULFATE 40 MG/ML IJ SOLN
INTRAMUSCULAR | Status: AC
  Filled 2012-10-08: qty 2

## 2012-10-08 MED ORDER — LATANOPROST 0.005 % OP SOLN
1.0000 [drp] | Freq: Every day | OPHTHALMIC | Status: DC
  Filled 2012-10-08: qty 2.5

## 2012-10-08 MED ORDER — ONDANSETRON HCL 4 MG/2ML IJ SOLN
4.0000 mg | Freq: Four times a day (QID) | INTRAMUSCULAR | Status: DC | PRN
  Administered 2012-10-08: 4 mg via INTRAVENOUS
  Filled 2012-10-08: qty 2

## 2012-10-08 MED ORDER — LIDOCAINE HCL (CARDIAC) 20 MG/ML IV SOLN
INTRAVENOUS | Status: DC | PRN
  Administered 2012-10-08: 40 mg via INTRAVENOUS

## 2012-10-08 MED ORDER — METFORMIN HCL 500 MG PO TABS
500.0000 mg | ORAL_TABLET | Freq: Every day | ORAL | Status: DC
  Administered 2012-10-09: 500 mg via ORAL
  Filled 2012-10-08 (×2): qty 1

## 2012-10-08 MED ORDER — BACITRACIN-POLYMYXIN B 500-10000 UNIT/GM OP OINT
1.0000 "application " | TOPICAL_OINTMENT | Freq: Four times a day (QID) | OPHTHALMIC | Status: DC
  Filled 2012-10-08: qty 3.5

## 2012-10-08 SURGICAL SUPPLY — 71 items
ACCESSORY FRAGMATOME (MISCELLANEOUS) IMPLANT
APPLICATOR DR MATTHEWS STRL (MISCELLANEOUS) IMPLANT
BLADE EYE CATARACT 19 1.4 BEAV (BLADE) IMPLANT
BLADE MVR KNIFE 19G (BLADE) IMPLANT
BLADE MVR KNIFE 20G (BLADE) ×2 IMPLANT
CANNULA ANT CHAM MAIN (OPHTHALMIC RELATED) IMPLANT
CANNULA FLEX TIP 25G (CANNULA) ×2 IMPLANT
CANNULA POLY TIP VF1 25GA (CANNULA) ×2 IMPLANT
CANNULA SUBRETINAL FLUID 20G (BLADE) ×2 IMPLANT
CANNULA VLV SOFT TIP 25GA (OPHTHALMIC) ×2 IMPLANT
CLOTH BEACON ORANGE TIMEOUT ST (SAFETY) ×2 IMPLANT
CORDS BIPOLAR (ELECTRODE) ×2 IMPLANT
COTTONBALL LRG STERILE PKG (GAUZE/BANDAGES/DRESSINGS) ×6 IMPLANT
COVER MAYO STAND STRL (DRAPES) ×2 IMPLANT
DRAPE INCISE 51X51 W/FILM STRL (DRAPES) IMPLANT
DRAPE OPHTHALMIC 77X100 STRL (CUSTOM PROCEDURE TRAY) ×2 IMPLANT
EAGLE VIT/RET MICRO PIC 168 25 (MISCELLANEOUS) IMPLANT
ERASER HMR WETFIELD 23G BP (MISCELLANEOUS) IMPLANT
FILTER BLUE MILLIPORE (MISCELLANEOUS) IMPLANT
FILTER STRAW FLUID ASPIR (MISCELLANEOUS) IMPLANT
FORCEPS ILM 25G DSP TIP (MISCELLANEOUS) ×2 IMPLANT
GAS AUTO FILL CONSTEL (OPHTHALMIC) ×2
GAS AUTO FILL CONSTELLATION (OPHTHALMIC) ×1 IMPLANT
GLOVE BIOGEL PI IND STRL 6.5 (GLOVE) ×1 IMPLANT
GLOVE BIOGEL PI INDICATOR 6.5 (GLOVE) ×1
GLOVE SS BIOGEL STRL SZ 6.5 (GLOVE) ×1 IMPLANT
GLOVE SS BIOGEL STRL SZ 7 (GLOVE) ×1 IMPLANT
GLOVE SUPERSENSE BIOGEL SZ 6.5 (GLOVE) ×1
GLOVE SUPERSENSE BIOGEL SZ 7 (GLOVE) ×1
GLOVE SURG 8.5 LATEX PF (GLOVE) ×2 IMPLANT
GLOVE SURG SS PI 6.5 STRL IVOR (GLOVE) ×2 IMPLANT
GOWN STRL NON-REIN LRG LVL3 (GOWN DISPOSABLE) ×6 IMPLANT
HANDLE PNEUMATIC FOR CONSTEL (OPHTHALMIC) ×2 IMPLANT
ILLUMINATOR CHOW PICK 25GA (MISCELLANEOUS) ×2 IMPLANT
KIT BASIN OR (CUSTOM PROCEDURE TRAY) ×2 IMPLANT
KIT ROOM TURNOVER OR (KITS) ×2 IMPLANT
KNIFE CRESCENT 1.75 EDGEAHEAD (BLADE) IMPLANT
KNIFE GRIESHABER SHARP 2.5MM (MISCELLANEOUS) IMPLANT
MASK EYE SHIELD (GAUZE/BANDAGES/DRESSINGS) ×2 IMPLANT
NEEDLE 18GX1X1/2 (RX/OR ONLY) (NEEDLE) ×4 IMPLANT
NEEDLE 25GX 5/8IN NON SAFETY (NEEDLE) ×2 IMPLANT
NEEDLE 27GAX1X1/2 (NEEDLE) IMPLANT
NEEDLE HYPO 30X.5 LL (NEEDLE) ×6 IMPLANT
NS IRRIG 1000ML POUR BTL (IV SOLUTION) ×2 IMPLANT
PACK VITRECTOMY CUSTOM (CUSTOM PROCEDURE TRAY) ×2 IMPLANT
PAD ARMBOARD 7.5X6 YLW CONV (MISCELLANEOUS) ×4 IMPLANT
PAD EYE OVAL STERILE LF (GAUZE/BANDAGES/DRESSINGS) ×2 IMPLANT
PAK VITRECTOMY PIK 25 GA (OPHTHALMIC RELATED) IMPLANT
PROBE LASER ILLUM FLEX CVD 25G (OPHTHALMIC) ×2 IMPLANT
REPL STRA BRUSH NEEDLE (NEEDLE) ×2 IMPLANT
RESERVOIR BACK FLUSH (MISCELLANEOUS) ×2 IMPLANT
ROLLS DENTAL (MISCELLANEOUS) ×4 IMPLANT
SCRAPER DIAMOND 25GA (OPHTHALMIC RELATED) IMPLANT
SCRAPER DIAMOND DUST MEMBRANE (MISCELLANEOUS) ×2 IMPLANT
SPONGE SURGIFOAM ABS GEL 12-7 (HEMOSTASIS) ×2 IMPLANT
STOPCOCK 4 WAY LG BORE MALE ST (IV SETS) ×2 IMPLANT
SUT CHROMIC 7 0 TG140 8 (SUTURE) IMPLANT
SUT ETHILON 9 0 TG140 8 (SUTURE) ×2 IMPLANT
SUT POLY NON ABSORB 10-0 8 STR (SUTURE) IMPLANT
SUT SILK 4 0 RB 1 (SUTURE) IMPLANT
SYR 20CC LL (SYRINGE) ×2 IMPLANT
SYR 50ML LL SCALE MARK (SYRINGE) IMPLANT
SYR 5ML LL (SYRINGE) IMPLANT
SYR BULB 3OZ (MISCELLANEOUS) ×2 IMPLANT
SYR TB 1ML LUER SLIP (SYRINGE) ×4 IMPLANT
SYRINGE 10CC LL (SYRINGE) ×2 IMPLANT
TAPE SURG TRANSPORE 1 IN (GAUZE/BANDAGES/DRESSINGS) ×1 IMPLANT
TAPE SURGICAL TRANSPORE 1 IN (GAUZE/BANDAGES/DRESSINGS) ×1
TOWEL OR 17X24 6PK STRL BLUE (TOWEL DISPOSABLE) ×6 IMPLANT
WATER STERILE IRR 1000ML POUR (IV SOLUTION) ×2 IMPLANT
WIPE INSTRUMENT VISIWIPE 73X73 (MISCELLANEOUS) ×2 IMPLANT

## 2012-10-08 NOTE — Transfer of Care (Signed)
Immediate Anesthesia Transfer of Care Note  Patient: Lucas Morales  Procedure(s) Performed: Procedure(s): 25 GAUGE PARS PLANA VITRECTOMY WITH 20 GAUGE MVR PORT FOR MACULAR HOLE; Membrame peel, serum patch; Laser treatment ; Gas exchange (Right)  Patient Location: PACU  Anesthesia Type:General  Level of Consciousness: awake, alert  and oriented  Airway & Oxygen Therapy: Patient Spontanous Breathing and Patient connected to nasal cannula oxygen  Post-op Assessment: Report given to PACU RN and Post -op Vital signs reviewed and stable  Post vital signs: Reviewed and stable  Complications: No apparent anesthesia complications

## 2012-10-08 NOTE — Op Note (Signed)
NAME:  Lucas Morales, Lucas Morales NO.:  [id number]  MEDICAL RECORD NO.:  [id number]  LOCATION:  [id number]                        FACILITY:  [facility]  PHYSICIAN:  Louay Myrie. Ashley Royalty, M.D. DATE OF BIRTH:  1933-10-02  DATE OF PROCEDURE:  10/08/2012 DATE OF DISCHARGE:                              OPERATIVE REPORT   ADMISSION DIAGNOSIS:  Macular hole, right eye.  PROCEDURES:  Pars plana vitrectomy, right eye, gas fluid exchange, retinal photocoagulation, membrane peel, serum patch, all in the right eye.  SURGEON:  Raydin Bielinski. Ashley Royalty, M.D.  ASSISTANT:  Rosalie Doctor SA.  ANESTHESIA:  General.  DETAILS:  After usual prep and drape, the indirect ophthalmoscope laser was moved into place.  A 730 burns were placed around the retinal periphery in 2 rows around weak areas of the retina.  The power was 400 mW 1000 microns each and 0.05 seconds each.  Once this was accomplished, the attention was carried to the pars plana area, where the 25-gauge trocars were placed at 8 and 10 o'clock.  A 20-gauge opening was made at 2 o'clock.  The pars plana vitrectomy was begun just behind the pseudophakia vitreous strands and membranes were encountered.  These were carefully removed under low suction and rapid cutting.  The vitrectomy was performed in a core fashion down to the macular surface. The silicone soft tip was then inserted into the 2 o'clock opening and drawn down to the posterior vitreous cavity, where the fish strike sign occurred.  Some membranes were lifted from their posterior position with the soft tip cannula and removed with the vitreous cutter.  The diamond- dusted membrane scraper was then used to engage the internal limiting membrane for 360 degrees around the hole.  The membrane was isolated and removed with the ILM forceps.  A 0.1 mL Kenalog was injected to mark the ILM and a posterior structures.  This Kenalog was removed with the vitreous cutter and the soft tip cannula.  It  showed that the ILM was removed.  The vitrectomy was carried into the mid periphery, where additional vitreous was removed, then into the far periphery, the 30- degree prismatic lens.  A total gas fluid exchange was carried out with the double or biconcave Sapphire lens.  The contact lens ring was anchored into place at 6 and 12 o'clock.  The flat and magnifying contact lens had been used earlier in the case.  Once the gas was removed, the sufficient time was allowed for additional fluid to track down the walls of the eye and collect in the posterior segment.  Serum patch and C3F8 was prepared during this time, 17% C3F8 was prepared. Additional fluid was removed with the soft tip cannula.  Serum patch was delivered.  The 17% C3F8 was exchanged for intravitreal gas.  The instruments were removed from the eye and the wounds were tested and found to be tight.  The sclerotomy was closed with 9-0 nylon at 2 o'clock.  Wet-field cautery was used for hemostasis and to close the conjunctiva.  The 25-gauge trocars were removed.  Polymyxin and gentamicin were irrigated into tenon space.  Atropine solution was applied. Decadron 10 mg was injected into the lower subconjunctival space.  Marcaine was injected around the globe for postop pain.  Polysporin ophthalmic ointment, a patch and shield were placed.  The patient was awakened and taken to recovery in the satisfactory condition.     Beulah Gandy. Ashley Royalty, M.D.     JDM/MEDQ  D:  10/08/2012  T:  10/08/2012  Job:  [id number]

## 2012-10-08 NOTE — Progress Notes (Signed)
10/08/12 1028  OBSTRUCTIVE SLEEP APNEA  Have you ever been diagnosed with sleep apnea through a sleep study? No  Do you snore loudly (loud enough to be heard through closed doors)?  1  Do you often feel tired, fatigued, or sleepy during the daytime? 1  Has anyone observed you stop breathing during your sleep? 1  Do you have, or are you being treated for high blood pressure? 1  BMI more than 35 kg/m2? 0  Age over 77 years old? 1  Neck circumference greater than 40 cm/18 inches? 0  Gender: 1  Obstructive Sleep Apnea Score 6  Score 4 or greater  Results sent to PCP

## 2012-10-08 NOTE — Preoperative (Signed)
Beta Blockers   Reason not to administer Beta Blockers:Not Applicable 

## 2012-10-08 NOTE — Anesthesia Preprocedure Evaluation (Addendum)
Anesthesia Evaluation  Patient identified by MRN, date of birth, ID band Patient awake    Reviewed: Allergy & Precautions, H&P , NPO status , Patient's Chart, lab work & pertinent test results, reviewed documented beta blocker date and time   Airway Mallampati: II TM Distance: >3 FB Neck ROM: Full    Dental  (+) Teeth Intact and Dental Advisory Given   Pulmonary  breath sounds clear to auscultation        Cardiovascular Rhythm:Regular Rate:Normal     Neuro/Psych    GI/Hepatic   Endo/Other    Renal/GU      Musculoskeletal   Abdominal   Peds  Hematology   Anesthesia Other Findings   Reproductive/Obstetrics                           Anesthesia Physical Anesthesia Plan  ASA: III  Anesthesia Plan: General   Post-op Pain Management:    Induction: Intravenous  Airway Management Planned: Oral ETT  Additional Equipment:   Intra-op Plan:   Post-operative Plan: Extubation in OR  Informed Consent: I have reviewed the patients History and Physical, chart, labs and discussed the procedure including the risks, benefits and alternatives for the proposed anesthesia with the patient or authorized representative who has indicated his/her understanding and acceptance.   Dental advisory given  Plan Discussed with: CRNA and Anesthesiologist  Anesthesia Plan Comments: (R. Macular hole CAD S/P CABG 01/18/12 Myoview (-) for ischemia 04/09/12 EF 51% Htn Type 2 DM glucose 124   Plan GA with oral ETT  Kipp Brood, MD)       Anesthesia Quick Evaluation

## 2012-10-08 NOTE — OR Nursing (Signed)
Gas alert bracelet to right wrist by Freida Busman, RN post op.

## 2012-10-08 NOTE — H&P (Signed)
I examined the patient today and there is no change in the medical status 

## 2012-10-08 NOTE — Anesthesia Postprocedure Evaluation (Signed)
  Anesthesia Post-op Note  Patient: Lucas Morales  Procedure(s) Performed: Procedure(s): 25 GAUGE PARS PLANA VITRECTOMY WITH 20 GAUGE MVR PORT FOR MACULAR HOLE; Membrame peel, serum patch; Laser treatment ; Gas exchange (Right)  Patient Location: PACU  Anesthesia Type:General  Level of Consciousness: awake, alert  and oriented  Airway and Oxygen Therapy: Patient Spontanous Breathing and Patient connected to nasal cannula oxygen  Post-op Pain: mild  Post-op Assessment: Post-op Vital signs reviewed, Patient's Cardiovascular Status Stable, Respiratory Function Stable, Patent Airway, No signs of Nausea or vomiting and Pain level controlled  Post-op Vital Signs: stable  Complications: No apparent anesthesia complications

## 2012-10-08 NOTE — Brief Op Note (Signed)
Brief Operative note   Preoperative diagnosis:  Pre-Op Diagnosis Codes:    * Macular cyst, hole, or pseudohole of retina [362.54] Postoperative diagnosis  Post-Op Diagnosis Codes:    * Macular cyst, hole, or pseudohole of retina [362.54]  Procedures: Pars plana vitrectomy, laser treatment, membrane peel, serum patch, gas exchange  Right eye  Surgeon:  Sherrie George, MD...  Assistant:  Rosalie Doctor SA   Anesthesia: General  Specimen: none  Estimated blood loss:  1cc  Complications: none  Patient sent to PACU in good condition  Composed by Sherrie George MD  Dictation number: [id number]

## 2012-10-09 ENCOUNTER — Encounter (HOSPITAL_COMMUNITY): Payer: Self-pay | Admitting: Ophthalmology

## 2012-10-09 ENCOUNTER — Encounter (INDEPENDENT_AMBULATORY_CARE_PROVIDER_SITE_OTHER): Payer: Self-pay | Admitting: Ophthalmology

## 2012-10-09 ENCOUNTER — Telehealth: Payer: Self-pay | Admitting: *Deleted

## 2012-10-09 LAB — GLUCOSE, CAPILLARY: Glucose-Capillary: 169 mg/dL — ABNORMAL HIGH (ref 70–99)

## 2012-10-09 MED ORDER — SUCRALFATE 1 GM/10ML PO SUSP: ORAL | Status: DC

## 2012-10-09 MED ORDER — GATIFLOXACIN 0.5 % OP SOLN: 1.0000 [drp] | Freq: Four times a day (QID) | OPHTHALMIC | Status: DC

## 2012-10-09 MED ORDER — BRIMONIDINE TARTRATE 0.2 % OP SOLN: 1.0000 [drp] | Freq: Two times a day (BID) | OPHTHALMIC | Status: DC

## 2012-10-09 MED ORDER — HYDROCODONE-ACETAMINOPHEN 5-325 MG PO TABS: 1.0000 | ORAL_TABLET | ORAL | Status: DC | PRN

## 2012-10-09 MED ORDER — PREDNISOLONE ACETATE 1 % OP SUSP: 1.0000 [drp] | Freq: Four times a day (QID) | OPHTHALMIC | Status: DC

## 2012-10-09 MED ORDER — RANITIDINE HCL 300 MG PO TABS: ORAL_TABLET | ORAL | Status: DC

## 2012-10-09 MED ORDER — BACITRACIN-POLYMYXIN B 500-10000 UNIT/GM OP OINT: 1.0000 "application " | TOPICAL_OINTMENT | Freq: Four times a day (QID) | OPHTHALMIC | Status: DC

## 2012-10-09 NOTE — Telephone Encounter (Signed)
Received a call from patient. He had eye surgery and is having to sleep on his stomach.  He is taking Pantoprazole. He is having terrible reflux. Any suggestions?

## 2012-10-09 NOTE — Telephone Encounter (Signed)
Please add Ranitidine 300mg  at dinner time and Protonix ( pantoprazole) 40 mg hs, also send Carafate slurry 10 cc po qhs, disp 10 oz, 1 refill

## 2012-10-09 NOTE — Telephone Encounter (Signed)
Patient given recommendations. Rx sent to pharmacy. 

## 2012-10-09 NOTE — Progress Notes (Signed)
10/09/2012, 6:42 AM  Mental Status:  Awake, Alert, Oriented  Anterior segment: Cornea  Clear    Anterior Chamber Clear    Lens:    IOL  Intra Ocular Pressure 21 mmHg with Tonopen  Vitreous: 90%gas bubble  Retina:  Attached Good laser reaction   Impression: Excellent result Retina attached  Final Diagnosis: Principal Problem:   Macular hole   Plan: start post operative eye drops.  Discharge to home.  Give post operative instructions  JANSEN, SCIUTO 10/09/2012, 6:42 AM

## 2012-10-09 NOTE — Discharge Summary (Signed)
Discharge summary not needed on [facility] patients per medical records. 

## 2012-10-09 NOTE — Progress Notes (Signed)
Patient discharged to home with instructions and verbalized understanding. 

## 2012-10-11 ENCOUNTER — Encounter (INDEPENDENT_AMBULATORY_CARE_PROVIDER_SITE_OTHER): Payer: Medicare PPO | Admitting: Ophthalmology

## 2012-10-11 DIAGNOSIS — H35349 Macular cyst, hole, or pseudohole, unspecified eye: Secondary | ICD-10-CM

## 2012-10-21 ENCOUNTER — Ambulatory Visit (INDEPENDENT_AMBULATORY_CARE_PROVIDER_SITE_OTHER)
Admission: RE | Admit: 2012-10-21 | Discharge: 2012-10-21 | Disposition: A | Discharge status: Home / Self Care | Payer: Medicare PPO | Source: Ambulatory Visit | Attending: Pulmonary Disease | Admitting: Pulmonary Disease

## 2012-10-21 ENCOUNTER — Encounter: Payer: Self-pay | Admitting: Pulmonary Disease

## 2012-10-21 ENCOUNTER — Ambulatory Visit (INDEPENDENT_AMBULATORY_CARE_PROVIDER_SITE_OTHER): Payer: Medicare PPO | Admitting: Pulmonary Disease

## 2012-10-21 VITALS — BP 120/68 | HR 85 | Temp 99.0°F | Ht 74.0 in | Wt 250.2 lb

## 2012-10-21 DIAGNOSIS — G4733 Obstructive sleep apnea (adult) (pediatric): Secondary | ICD-10-CM | POA: Insufficient documentation

## 2012-10-21 DIAGNOSIS — R0989 Other specified symptoms and signs involving the circulatory and respiratory systems: Secondary | ICD-10-CM

## 2012-10-21 DIAGNOSIS — R0600 Dyspnea, unspecified: Secondary | ICD-10-CM

## 2012-10-21 DIAGNOSIS — R0609 Other forms of dyspnea: Secondary | ICD-10-CM

## 2012-10-21 DIAGNOSIS — R0683 Snoring: Secondary | ICD-10-CM

## 2012-10-21 DIAGNOSIS — R0602 Shortness of breath: Secondary | ICD-10-CM

## 2012-10-21 NOTE — Assessment & Plan Note (Signed)
He continues to have snoring, sleep disruption, and daytime sleepiness.  He is concerned he could have sleep apnea.  Will need to assess for sleep study when he has recovered fully from his eye surgery.

## 2012-10-21 NOTE — Assessment & Plan Note (Signed)
He continues to have symptoms of dyspnea.  His most recent PFT showed isolated diffusion defect >> this is new compared to October 2013.  Will repeat his CXR today.  Depending on results he may need further testing with labs and CT chest.

## 2012-10-21 NOTE — Patient Instructions (Signed)
Chest xray today >> will call with results Follow up in 4 weeks

## 2012-10-21 NOTE — Progress Notes (Signed)
Chief Complaint  Patient presents with  . Follow-up    Pt here to discuss PFT. Pt had eye surgery 10/08/12. Pt reports his breathing has been fair. he has been on bed rest x 3 weeks. Denies any wheezing, no chest tx. He is having mild chest pains at times since the surgery    CC: Lucas Morales  History of Present Illness: Lucas Morales is a 79 y.o. male never smoker with dyspnea and snoring.  He continues to have trouble with his breathing.  He is not having cough, wheeze, sputum, chest pain, fever, skin rash, leg swelling, or joint swelling.    He had PFT in July >> this showed isolated diffusion defect.    He had his retinal surgery done.  He is still recovering from this.  He is concerned about his sleep.  He is snoring, stops breathing, has witnessed apnea, and is tired during the day.  Tests: 01/04/12 Echo >> mod/severe LVH, EF 55%, mod/severe LA dilation, mod MR 01/12/12 PFT >> FEV1 3.33 (101%), FEV1% 72, TLC 6.98 (93%), DLCO 86% PFT 09/18/12 >> FEV1 3.47 (101%), FEV1% 77, TLC 7.34 (95%), DLCO 60%, no BD  Lucas Morales  has a past medical history of Adenomatous colon polyp; Hemorrhoids; Diverticulosis; Gastritis; IC (interstitial cystitis); Esophageal stricture; Diverticulitis; Hyperlipidemia; Anginal pain (01/11/2012); Bronchitis; Pre-diabetes; Gout; Dysrhythmia; Coronary artery disease; Hypertension; Diabetes mellitus without complication; History of bronchitis; Joint pain; Joint swelling; Gout; GERD (gastroesophageal reflux disease); History of colon polyps; Urinary frequency; Interstitial cystitis; Depression; Nocturia; Insomnia; Shortness of breath (01/11/2012); Hiatal hernia; Esophageal dysmotility; and Arthritis.  Lucas Morales  has past surgical history that includes Reconstruction medial collateral ligament elbow w/ tendon graft (1988 X 2); Total knee arthroplasty (~ 2001; 2011); bladder lesion removed; Exploratory laparotomy (2009); Tonsillectomy and adenoidectomy  (~1610); Appendectomy (2009); Cholecystectomy (2009); Lumbar disc surgery (?2010); Cystoscopy with ureteroscopy (2008; 2009); Cataract extraction w/ intraocular lens  implant, bilateral (2011); Tumor excision (12/2011); Elbow surgery (1988); Colectomy; heel spurs removed (25+yrs ago); Colonoscopy; Esophagogastroduodenoscopy; Coronary artery bypass graft (01/18/2012); Endoharvest vein of greater saphenous vein (01/18/2012); Cardiac catheterization (01/11/2012); Joint replacement (90's and 2000's); Pars plana vitrectomy w/ repair of macular hole (Right, 10/08/2012); and 25 gauge pars plana vitrectomy with 20 gauge mvr port for macular hole (Right, 10/08/2012).  Prior to Admission medications   Medication Sig Start Date End Date Taking? Authorizing Provider  aspirin EC 325 MG EC tablet Take 1 tablet (325 mg total) by mouth daily. 01/22/12  Yes Erin Barrett, PA-C  atorvastatin (LIPITOR) 40 MG tablet Take 1 tablet (40 mg total) by mouth daily at 6 PM. 01/26/12  Yes Donielle Margaretann Loveless, PA-C  calcium carbonate (TUMS EX) 750 MG chewable tablet Chew 2 tablets by mouth daily.    Yes Historical Provider, MD  loperamide (IMODIUM) 2 MG capsule Take 2 mg by mouth as needed.    Yes Historical Provider, MD  losartan (COZAAR) 50 MG tablet Take 1 tablet (50 mg total) by mouth daily. 01/26/12  Yes Donielle Margaretann Loveless, PA-C  metFORMIN (GLUCOPHAGE) 500 MG tablet Take 1 tablet (500 mg total) by mouth 2 (two) times daily with a meal. 01/26/12  Yes Donielle Margaretann Loveless, PA-C  metoprolol tartrate (LOPRESSOR) 25 MG tablet Take 1 tablet (25 mg total) by mouth 2 (two) times daily. 01/26/12  Yes Donielle Margaretann Loveless, PA-C  mometasone (NASONEX) 50 MCG/ACT nasal spray Place 2 sprays into the nose 2 (two) times daily.    Yes Historical Provider, MD  Multiple Vitamins-Minerals (MULTIVITAMIN WITH MINERALS) tablet Take 1 tablet by mouth daily.     Yes Historical Provider, MD  naproxen (NAPROSYN) 500 MG tablet Take 500 mg by mouth as needed.  Take 3 pills prn gout attack   Yes Historical Provider, MD  nortriptyline (PAMELOR) 10 MG capsule Take 10 mg by mouth at bedtime. Take 10 to 30 mg for sleep   Yes Historical Provider, MD  OVER THE COUNTER MEDICATION Fiber Well--2 tabs dailt   Yes Historical Provider, MD  pantoprazole (PROTONIX) 40 MG tablet Take 40 mg by mouth 2 (two) times daily.    Yes Historical Provider, MD  tadalafil (CIALIS) 10 MG tablet Take 10 mg by mouth daily as needed. Take daily for interstitial cystitis   Yes Historical Provider, MD  MOVIPREP 100 G SOLR Moviprep as directed / no substitutions 06/25/12   Hart Carwin, MD    Allergies  Allergen Reactions  . Morphine Itching    Physical Exam:  General - No distress ENT - No sinus tenderness, no oral exudate, no LAN Cardiac - s1s2 regular, no murmur, pulses symmetric Chest - No wheeze/rales/dullness, good air entry, normal respiratory excursion Back - No focal tenderness Abd - Soft, non-tender Ext - No edema Neuro - Normal strength Skin - No rashes Psych - Normal mood, and behavior  Assessment/Plan:  [name], MD [facility] Pager:  [phone]

## 2012-10-22 ENCOUNTER — Telehealth: Payer: Self-pay | Admitting: Pulmonary Disease

## 2012-10-22 NOTE — Telephone Encounter (Signed)
Dg Chest 2 View  10/21/2012   *RADIOLOGY REPORT*  Clinical Data: Dyspnea.  Abnormal pulmonary function tests. Nonsmoker.  CHEST - 2 VIEW  Comparison: 02/14/2012 and abdominal pelvic CT of 04/30/2009  Findings: Hyperinflation. Prior median sternotomy.  Probable intra- articular loose body in the right glenohumeral joint.  Chronic. Midline trachea.  Borderline cardiomegaly with tortuous thoracic aorta.  Moderate hiatal hernia. No pleural effusion or pneumothorax.  Probable calcified granulomas in the right upper lobe, unchanged.  Similar bibasilar opacities, favored to represent areas of scarring.  IMPRESSION: No interval change or explanation for progressive dyspnea.  Bibasilar scarring with moderate hiatal hernia.   Original Report Authenticated By: Jeronimo Greaves, M.D.    Discussed with pt.  Will continue clinical observation.  Defer CT chest imaging for now.

## 2012-10-23 ENCOUNTER — Other Ambulatory Visit: Payer: Self-pay

## 2012-11-04 ENCOUNTER — Encounter (INDEPENDENT_AMBULATORY_CARE_PROVIDER_SITE_OTHER): Payer: Medicare PPO | Admitting: Ophthalmology

## 2012-11-04 DIAGNOSIS — H35349 Macular cyst, hole, or pseudohole, unspecified eye: Secondary | ICD-10-CM

## 2012-11-07 ENCOUNTER — Other Ambulatory Visit: Payer: Self-pay | Admitting: Cardiovascular Disease

## 2012-11-08 NOTE — Telephone Encounter (Signed)
Rx was sent to pharmacy electronically. 

## 2012-11-21 ENCOUNTER — Encounter: Payer: Self-pay | Admitting: Pulmonary Disease

## 2012-11-21 ENCOUNTER — Ambulatory Visit (INDEPENDENT_AMBULATORY_CARE_PROVIDER_SITE_OTHER): Payer: Medicare PPO | Admitting: Pulmonary Disease

## 2012-11-21 VITALS — BP 118/74 | HR 87 | Temp 97.3°F | Ht 75.5 in | Wt 252.0 lb

## 2012-11-21 DIAGNOSIS — R0609 Other forms of dyspnea: Secondary | ICD-10-CM

## 2012-11-21 DIAGNOSIS — R0602 Shortness of breath: Secondary | ICD-10-CM

## 2012-11-21 DIAGNOSIS — R0683 Snoring: Secondary | ICD-10-CM

## 2012-11-21 NOTE — Progress Notes (Signed)
Chief Complaint  Patient presents with  . Follow-up    Dyspnea>>No change.     CC: Lucas Morales  History of Present Illness: Lucas Morales is a 79 y.o. male never smoker with dyspnea and snoring.  He has been cleared to increase his exercise activity after further healing from his eye surgery.  He is doing water aerobics, and can do this for an hour.  He still gets winded when going up stairs, or walking back from his mailbox >> this is 100 yards at slight incline.  He does okay on level ground.  He has occasional cough with clear sputum.  He denies wheeze, chest tightness, or hemoptysis.  He has hx of hiatal hernia, and has occasional reflux.  Tests: 01/04/12 Echo >> mod/severe LVH, EF 55%, mod/severe LA dilation, mod MR 01/12/12 PFT >> FEV1 3.33 (101%), FEV1% 72, TLC 6.98 (93%), DLCO 86% PFT 09/18/12 >> FEV1 3.47 (101%), FEV1% 77, TLC 7.34 (95%), DLCO 60%, no BD  Lucas Morales  has a past medical history of Adenomatous colon polyp; Hemorrhoids; Diverticulosis; Gastritis; IC (interstitial cystitis); Esophageal stricture; Diverticulitis; Hyperlipidemia; Anginal pain (01/11/2012); Bronchitis; Pre-diabetes; Gout; Dysrhythmia; Coronary artery disease; Hypertension; Diabetes mellitus without complication; History of bronchitis; Joint pain; Joint swelling; Gout; GERD (gastroesophageal reflux disease); History of colon polyps; Urinary frequency; Interstitial cystitis; Depression; Nocturia; Insomnia; Shortness of breath (01/11/2012); Hiatal hernia; Esophageal dysmotility; and Arthritis.  Lucas Morales  has past surgical history that includes Reconstruction medial collateral ligament elbow w/ tendon graft (1988 X 2); Total knee arthroplasty (~ 2001; 2011); bladder lesion removed; Exploratory laparotomy (2009); Tonsillectomy and adenoidectomy (~1610); Appendectomy (2009); Cholecystectomy (2009); Lumbar disc surgery (?2010); Cystoscopy with ureteroscopy (2008; 2009); Cataract extraction w/  intraocular lens  implant, bilateral (2011); Tumor excision (12/2011); Elbow surgery (1988); Colectomy; heel spurs removed (25+yrs ago); Colonoscopy; Esophagogastroduodenoscopy; Coronary artery bypass graft (01/18/2012); Endoharvest vein of greater saphenous vein (01/18/2012); Cardiac catheterization (01/11/2012); Joint replacement (90's and 2000's); Pars plana vitrectomy w/ repair of macular hole (Right, 10/08/2012); and 25 gauge pars plana vitrectomy with 20 gauge mvr port for macular hole (Right, 10/08/2012).  Prior to Admission medications   Medication Sig Start Date End Date Taking? Authorizing Provider  aspirin EC 325 MG EC tablet Take 1 tablet (325 mg total) by mouth daily. 01/22/12  Yes Erin Barrett, PA-C  atorvastatin (LIPITOR) 40 MG tablet Take 1 tablet (40 mg total) by mouth daily at 6 PM. 01/26/12  Yes Donielle Margaretann Loveless, PA-C  calcium carbonate (TUMS EX) 750 MG chewable tablet Chew 2 tablets by mouth daily.    Yes Historical Provider, MD  loperamide (IMODIUM) 2 MG capsule Take 2 mg by mouth as needed.    Yes Historical Provider, MD  losartan (COZAAR) 50 MG tablet Take 1 tablet (50 mg total) by mouth daily. 01/26/12  Yes Donielle Margaretann Loveless, PA-C  metFORMIN (GLUCOPHAGE) 500 MG tablet Take 1 tablet (500 mg total) by mouth 2 (two) times daily with a meal. 01/26/12  Yes Donielle Margaretann Loveless, PA-C  metoprolol tartrate (LOPRESSOR) 25 MG tablet Take 1 tablet (25 mg total) by mouth 2 (two) times daily. 01/26/12  Yes Donielle Margaretann Loveless, PA-C  mometasone (NASONEX) 50 MCG/ACT nasal spray Place 2 sprays into the nose 2 (two) times daily.    Yes Historical Provider, MD  Multiple Vitamins-Minerals (MULTIVITAMIN WITH MINERALS) tablet Take 1 tablet by mouth daily.     Yes Historical Provider, MD  naproxen (NAPROSYN) 500 MG tablet Take 500 mg by mouth as  needed. Take 3 pills prn gout attack   Yes Historical Provider, MD  nortriptyline (PAMELOR) 10 MG capsule Take 10 mg by mouth at bedtime. Take 10 to 30 mg  for sleep   Yes Historical Provider, MD  OVER THE COUNTER MEDICATION Fiber Well--2 tabs dailt   Yes Historical Provider, MD  pantoprazole (PROTONIX) 40 MG tablet Take 40 mg by mouth 2 (two) times daily.    Yes Historical Provider, MD  tadalafil (CIALIS) 10 MG tablet Take 10 mg by mouth daily as needed. Take daily for interstitial cystitis   Yes Historical Provider, MD  MOVIPREP 100 G SOLR Moviprep as directed / no substitutions 06/25/12   Hart Carwin, MD    Allergies  Allergen Reactions  . Morphine Itching    Physical Exam:  General - No distress ENT - No sinus tenderness, no oral exudate, no LAN Cardiac - s1s2 regular, no murmur Chest - No wheeze/rales/dullness, good air entry, normal respiratory excursion Back - No focal tenderness Abd - Soft, non-tender Ext - No edema Neuro - Normal strength Skin - No rashes Psych - Normal mood, and behavior  Assessment/Plan:  Lucas Helling, MD [facility] Pager:  [phone]

## 2012-11-21 NOTE — Patient Instructions (Signed)
Can try using albuterol inhaler two puffs up to four times per day as needed for cough, wheeze, or chest congestion Follow up in 6 months

## 2012-11-21 NOTE — Assessment & Plan Note (Signed)
Pulmonary w/u relatively unrevealing to date.  He can try using prn proair to help with cough.  Advised him to gradually increase his exercise regimen as tolerated >> this should include cardiovascular and light weight training regimen.

## 2012-11-21 NOTE — Assessment & Plan Note (Signed)
Re-assess at next visit whether he needs sleep study.

## 2012-12-09 ENCOUNTER — Other Ambulatory Visit: Payer: Self-pay | Admitting: Cardiovascular Disease

## 2013-01-13 ENCOUNTER — Encounter (INDEPENDENT_AMBULATORY_CARE_PROVIDER_SITE_OTHER): Payer: Medicare PPO | Admitting: Ophthalmology

## 2013-01-13 DIAGNOSIS — H35039 Hypertensive retinopathy, unspecified eye: Secondary | ICD-10-CM

## 2013-01-13 DIAGNOSIS — I10 Essential (primary) hypertension: Secondary | ICD-10-CM

## 2013-01-13 DIAGNOSIS — H35349 Macular cyst, hole, or pseudohole, unspecified eye: Secondary | ICD-10-CM

## 2013-01-13 DIAGNOSIS — H35379 Puckering of macula, unspecified eye: Secondary | ICD-10-CM

## 2013-01-13 DIAGNOSIS — H43819 Vitreous degeneration, unspecified eye: Secondary | ICD-10-CM

## 2013-01-23 ENCOUNTER — Other Ambulatory Visit: Payer: Self-pay

## 2013-03-17 ENCOUNTER — Ambulatory Visit (INDEPENDENT_AMBULATORY_CARE_PROVIDER_SITE_OTHER): Payer: Medicare PPO | Admitting: Cardiovascular Disease

## 2013-03-17 ENCOUNTER — Encounter: Payer: Self-pay | Admitting: Cardiovascular Disease

## 2013-03-17 VITALS — BP 118/62 | HR 86 | Ht 75.5 in | Wt 251.0 lb

## 2013-03-17 DIAGNOSIS — Z951 Presence of aortocoronary bypass graft: Secondary | ICD-10-CM

## 2013-03-17 DIAGNOSIS — R0602 Shortness of breath: Secondary | ICD-10-CM

## 2013-03-17 DIAGNOSIS — E785 Hyperlipidemia, unspecified: Secondary | ICD-10-CM

## 2013-03-17 DIAGNOSIS — R079 Chest pain, unspecified: Secondary | ICD-10-CM

## 2013-03-17 DIAGNOSIS — I4891 Unspecified atrial fibrillation: Secondary | ICD-10-CM

## 2013-03-17 DIAGNOSIS — I10 Essential (primary) hypertension: Secondary | ICD-10-CM

## 2013-03-17 NOTE — Patient Instructions (Signed)
  We will see you back in follow up after the tests  Dr Allyson Sabal has ordered a lexiscan myoview and an echocardiogram

## 2013-03-17 NOTE — Assessment & Plan Note (Addendum)
Patient had elective coronary artery bypass grafting x4 by Dr. Kathlee Nations Trigt 01/18/12 with a LIMA to his LAD, vein to distal LAD, obtuse marginal branches 3 and 4. He did have a Myoview stress test performed earlier this year which was nonischemic and normal 2-D echo last year. He does complain of ongoing chest pain every day and increasing dyspnea on exertion. I'm going to repeat a from a Myoview stress test and 2-D echo I will see him back after that for further evaluation.

## 2013-03-17 NOTE — Assessment & Plan Note (Signed)
On statin therapy followed by his PCP 

## 2013-03-17 NOTE — Progress Notes (Signed)
03/17/2013 Lucas Morales   Jan 21, 1934  [id number]  Primary Physician [name], MD Primary Cardiologist: Runell Gess MD Lucas Morales   HPI:  The patient is a 79 year old, moderately overweight, married, Caucasian male, father of two, who I last saw in the office approximately five weeks ago. He has a history of hypertension and hyperlipidemia. He was initially referred to me because of dyspnea. A CT angiogram of his heart performed three years prior showed moderate plaquing in all three coronary arteries with a calcium score of 800, and a 2D echo revealed an EF of 45 to 50%. Because of increasing symptoms of dyspnea on exertion, as well as chest pain radiating to his left upper extremity, a Myoview stress test was performed on January 04, 2012, which showed inferolateral ischemia. Ultimately he was catheterized by Dr. Bryan Lemma on October 24, revealing two vessel disease, and he underwent coronary artery bypass grafting x4 by Dr. Kathlee Nations Trigt on October 31 with a LIMA to his LAD, vein to a distal LAD, obtuse marginal brach 3 and 4. His postoperative course was complicated by PAF, for which he was placed on Coumadin anticoagulation, Digoxin, and amiodarone. When I saw him back in January, he had a prolonged episode of chest pain. A Myoview stress test performed on January 21 was nonischemic. A MCOT showed no evidence of PAF, and as a result, I stopped his amiodarone, Coumadin, and Digoxin. He participated in cardiac rehab.  He saw [name] [facility] back in the office in March and July. He's been complaining of progressive dyspnea and chest pain. I did order a complex by the stress test and 2-D echocardiogram    Current Outpatient Prescriptions  Medication Sig Dispense Refill  . albuterol (PROAIR HFA) 108 (90 BASE) MCG/ACT inhaler Inhale 2 puffs into the lungs every 6 (six) hours as needed for wheezing or shortness of breath.  1 Inhaler  5  . aspirin EC 325  MG EC tablet Take 1 tablet (325 mg total) by mouth daily.  30 tablet    . atorvastatin (LIPITOR) 40 MG tablet Take 40 mg by mouth daily.      . caffeine 200 MG TABS tablet Take 400 mg by mouth daily.      . calcium carbonate (TUMS EX) 750 MG chewable tablet Chew 3-4 tablets by mouth daily.       [name] loperamide (IMODIUM) 2 MG capsule as needed.       [name] losartan (COZAAR) 50 MG tablet Take 1 tablet (50 mg total) by mouth daily.  30 tablet  1  . metFORMIN (GLUCOPHAGE) 500 MG tablet TAKE 1 TABLET BY MOUTH TWICE DAILY WITH A MEAL  60 tablet  0  . metoprolol tartrate (LOPRESSOR) 25 MG tablet Take 1 tablet (25 mg total) by mouth 2 (two) times daily.  60 tablet  1  . Multiple Vitamins-Minerals (MULTIVITAMIN WITH MINERALS) tablet Take 1 tablet by mouth daily.        . naproxen (NAPROSYN) 500 MG tablet Take 500 mg by mouth as needed. Take 3 pills prn gout attack      . tadalafil (CIALIS) 5 MG tablet Take 5 mg by mouth daily.       No current facility-administered medications for this visit.    Allergies  Allergen Reactions  . Morphine Itching  . Desmopressin Itching    History   Social History  . Marital Status: Married    Spouse Name: N/A    Number  of Children: 2  . Years of Education: N/A   Occupational History  . Retired    Social History Main Topics  . Smoking status: Never Smoker   . Smokeless tobacco: Never Used  . Alcohol Use: Yes     Comment: 09/30/2012 - has small glass of wine every other day;01/11/2012 "last alcohol > 2 yr ago; neer had problem w/it"   . Drug Use: No  . Sexual Activity: Yes   Other Topics Concern  . Not on file   Social History Narrative  . No narrative on file     Review of Systems: General: negative for chills, fever, night sweats or weight changes.  Cardiovascular: negative for chest pain, dyspnea on exertion, edema, orthopnea, palpitations, paroxysmal nocturnal dyspnea or shortness of breath Dermatological: negative for rash Respiratory: negative  for cough or wheezing Urologic: negative for hematuria Abdominal: negative for nausea, vomiting, diarrhea, bright red blood per rectum, melena, or hematemesis Neurologic: negative for visual changes, syncope, or dizziness All other systems reviewed and are otherwise negative except as noted above.    Blood pressure 118/62, pulse 86, height 6' 3.5" (1.918 m), weight 251 lb (113.853 kg).  General appearance: alert and no distress Neck: no adenopathy, no carotid bruit, no JVD, supple, symmetrical, trachea midline and thyroid not enlarged, symmetric, no tenderness/mass/nodules Lungs: clear to auscultation bilaterally Heart: regular rate and rhythm, S1, S2 normal, no murmur, click, rub or gallop Extremities: extremities normal, atraumatic, no cyanosis or edema  EKG normal sinus rhythm at 86 without ST or T wave changes. There were Q waves in the inferior leads unchanged from prior EKGs  ASSESSMENT AND PLAN:   S/P CABG x 4, elective, 01/18/12 Patient had elective coronary artery bypass grafting x4 by Dr. Kathlee Nations Trigt 01/18/12 with a LIMA to his LAD, vein to distal LAD, obtuse marginal branches 3 and 4. He did have a Myoview stress test performed earlier this year which was nonischemic and normal 2-D echo last year. He does complain of ongoing chest pain every day and increasing dyspnea on exertion. I'm going to repeat a from a Myoview stress test and 2-D echo I will see him back after that for further evaluation.  Hypertension Controlled on current medications  Hyperlipidemia On statin therapy followed by his PCP      Runell Gess MD [facility], [facility] 03/17/2013 5:04 PM

## 2013-03-17 NOTE — Assessment & Plan Note (Signed)
Controlled on current medications 

## 2013-03-18 ENCOUNTER — Telehealth (HOSPITAL_COMMUNITY): Payer: Self-pay | Admitting: *Deleted

## 2013-03-21 ENCOUNTER — Encounter (HOSPITAL_COMMUNITY): Payer: Self-pay | Admitting: *Deleted

## 2013-03-21 ENCOUNTER — Other Ambulatory Visit (HOSPITAL_COMMUNITY): Payer: Self-pay | Admitting: *Deleted

## 2013-03-25 ENCOUNTER — Other Ambulatory Visit (HOSPITAL_COMMUNITY): Payer: Self-pay | Admitting: *Deleted

## 2013-04-01 ENCOUNTER — Ambulatory Visit (HOSPITAL_COMMUNITY)
Admission: RE | Admit: 2013-04-01 | Discharge: 2013-04-01 | Disposition: A | Discharge status: Home / Self Care | Payer: Medicare PPO | Source: Ambulatory Visit | Attending: Cardiovascular Disease | Admitting: Cardiovascular Disease

## 2013-04-01 DIAGNOSIS — R0602 Shortness of breath: Secondary | ICD-10-CM | POA: Insufficient documentation

## 2013-04-01 DIAGNOSIS — I4891 Unspecified atrial fibrillation: Secondary | ICD-10-CM | POA: Insufficient documentation

## 2013-04-01 DIAGNOSIS — R079 Chest pain, unspecified: Secondary | ICD-10-CM | POA: Insufficient documentation

## 2013-04-01 MED ORDER — AMINOPHYLLINE 25 MG/ML IV SOLN
150.0000 mg | Freq: Once | INTRAVENOUS | Status: AC
  Administered 2013-04-01: 150 mg via INTRAVENOUS

## 2013-04-01 MED ORDER — REGADENOSON 0.4 MG/5ML IV SOLN
0.4000 mg | Freq: Once | INTRAVENOUS | Status: AC
  Administered 2013-04-01: 0.4 mg via INTRAVENOUS

## 2013-04-01 MED ORDER — TECHNETIUM TC 99M SESTAMIBI GENERIC - CARDIOLITE
10.2000 | Freq: Once | INTRAVENOUS | Status: AC | PRN
  Administered 2013-04-01: 10 via INTRAVENOUS

## 2013-04-01 MED ORDER — TECHNETIUM TC 99M SESTAMIBI GENERIC - CARDIOLITE
30.4000 | Freq: Once | INTRAVENOUS | Status: AC | PRN
  Administered 2013-04-01: 30 via INTRAVENOUS

## 2013-04-01 NOTE — Procedures (Addendum)
[facility] [address] [place] [address] [phone]  Cardiology Nuclear Med Study  Lucas Morales is a 80 y.o. male     MRN : [id number]     DOB: [birth year]  Procedure Date: 04/01/2013  Nuclear Med Background Indication for Stress Test:  Evaluation for Ischemia and Abnormal EKG History:  CAD;CABG X4-01/18/2012;AFIB W/RVR Cardiac Risk Factors: Hypertension, Lipids, NIDDM and Overweight  Symptoms:  Chest Pain, Dizziness, DOE, Fatigue, Light-Headedness and SOB   Nuclear Pre-Procedure Caffeine/Decaff Intake:  1:00am NPO After: 11AM   IV Site: R Hand  IV 0.9% NS with Angio Cath:  22g  Chest Size (in):  42"  IV Started by: Azucena Cecil, RN  Height: 6' 3.5" (1.918 m)  Cup Size: n/a  BMI:  Body mass index is 30.95 kg/(m^2). Weight:  251 lb (113.853 kg)   Tech Comments:  N/A    Nuclear Med Study 1 or 2 day study: 1 day  Stress Test Type:  [place] Provider:  Quay Burow, MD   Resting Radionuclide: Technetium 17m Sestamibi  Resting Radionuclide Dose: 10.2 mCi   Stress Radionuclide:  Technetium 38m Sestamibi  Stress Radionuclide Dose: 30.4 mCi           Stress Protocol Rest HR: 73 Stress HR: 79  Rest BP: 140/80 Stress BP: 144/85  Exercise Time (min): n/a METS: n/a   Predicted Max HR: 143 bpm % Max HR: 61.54 bpm Rate Pressure Product: 12672  Dose of Adenosine (mg):  n/a Dose of Lexiscan: 0.4 mg  Dose of Atropine (mg): n/a Dose of Dobutamine: n/a mcg/kg/min (at max HR)  Stress Test Technologist: Leane Para, CCT Nuclear Technologist: Imagene Riches, CNMT   Rest Procedure:  Myocardial perfusion imaging was performed at rest 45 minutes following the intravenous administration of Technetium 22m Sestamibi. Stress Procedure:  The patient received IV Lexiscan 0.4 mg over 15-seconds.  Technetium 46m Sestamibi injected at 30-seconds.  The patient experienced increased SOB and nausea; 150 mg IV  Aminophylline was administered with resolution of symptoms.   There were no significant changes with Lexiscan.  Quantitative spect images were obtained after a 45 minute delay.  Transient Ischemic Dilatation (Normal <1.22):  1.06 Lung/Heart Ratio (Normal <0.45):  0.27 QGS EDV:  122 ml QGS ESV:  60 ml LV Ejection Fraction: 51%  Rest ECG: NSR - Normal EKG  Stress ECG: No significant change from baseline ECG  QPS Raw Data Images:  Normal; no motion artifact; normal heart/lung ratio. Stress Images:  Small defect in the mid to distal anteroseptum. Fixed inferoapical defect. Rest Images:  Fixed inferoapical defect. Subtraction (SDS):  Small area of mild reversible ischemia, SDS 4. Extent 6%.  Impression Exercise Capacity:  Lexiscan with no exercise. BP Response:  Normal blood pressure response. Clinical Symptoms:  Nausea, dyspnea ECG Impression:  There are scattered PVCs. Comparison with Prior Nuclear Study: Small area of mid to distal anteroseptal reversibility  Overall Impression:  Intermediate risk stress nuclear study with a small area of anteroseptal reversibility. There is a fixed inferoapical defect. SDS 4. Extent 6%.  LV Wall Motion:  Anteroseptal hypokinesis. EF 51%.  Lucas Casino, MD, [facility] Board Certified in Nuclear Cardiology Attending Cardiologist [place], MD  04/01/2013 4:18 PM

## 2013-04-08 ENCOUNTER — Ambulatory Visit (HOSPITAL_COMMUNITY)
Admission: RE | Admit: 2013-04-08 | Discharge: 2013-04-08 | Disposition: A | Discharge status: Home / Self Care | Payer: Medicare PPO | Source: Ambulatory Visit | Attending: Cardiovascular Disease | Admitting: Cardiovascular Disease

## 2013-04-08 DIAGNOSIS — I10 Essential (primary) hypertension: Secondary | ICD-10-CM | POA: Insufficient documentation

## 2013-04-08 DIAGNOSIS — I079 Rheumatic tricuspid valve disease, unspecified: Secondary | ICD-10-CM | POA: Insufficient documentation

## 2013-04-08 DIAGNOSIS — R079 Chest pain, unspecified: Secondary | ICD-10-CM

## 2013-04-08 DIAGNOSIS — I517 Cardiomegaly: Secondary | ICD-10-CM | POA: Insufficient documentation

## 2013-04-08 DIAGNOSIS — R0989 Other specified symptoms and signs involving the circulatory and respiratory systems: Principal | ICD-10-CM | POA: Insufficient documentation

## 2013-04-08 DIAGNOSIS — E785 Hyperlipidemia, unspecified: Secondary | ICD-10-CM | POA: Insufficient documentation

## 2013-04-08 DIAGNOSIS — I059 Rheumatic mitral valve disease, unspecified: Secondary | ICD-10-CM

## 2013-04-08 DIAGNOSIS — I4891 Unspecified atrial fibrillation: Secondary | ICD-10-CM | POA: Insufficient documentation

## 2013-04-08 DIAGNOSIS — R0602 Shortness of breath: Secondary | ICD-10-CM

## 2013-04-08 DIAGNOSIS — R0609 Other forms of dyspnea: Secondary | ICD-10-CM | POA: Insufficient documentation

## 2013-04-08 NOTE — Progress Notes (Signed)
2D Echo Performed 04/08/2013    Keena Heesch, RCS  

## 2013-04-13 ENCOUNTER — Other Ambulatory Visit: Payer: Self-pay | Admitting: Cardiovascular Disease

## 2013-04-14 ENCOUNTER — Encounter: Payer: Self-pay | Admitting: *Deleted

## 2013-04-14 NOTE — Telephone Encounter (Signed)
Rx was sent to pharmacy electronically. 

## 2013-04-22 ENCOUNTER — Encounter: Payer: Self-pay | Admitting: Cardiovascular Disease

## 2013-04-22 ENCOUNTER — Ambulatory Visit (INDEPENDENT_AMBULATORY_CARE_PROVIDER_SITE_OTHER): Payer: Medicare PPO | Admitting: Cardiovascular Disease

## 2013-04-22 VITALS — BP 124/72 | HR 70 | Ht 75.0 in | Wt 258.4 lb

## 2013-04-22 DIAGNOSIS — I10 Essential (primary) hypertension: Secondary | ICD-10-CM

## 2013-04-22 DIAGNOSIS — E785 Hyperlipidemia, unspecified: Secondary | ICD-10-CM

## 2013-04-22 DIAGNOSIS — Z951 Presence of aortocoronary bypass graft: Secondary | ICD-10-CM

## 2013-04-22 NOTE — Assessment & Plan Note (Signed)
On statin therapy followed by his PCP. We'll obtain a copy of this for her records

## 2013-04-22 NOTE — Patient Instructions (Signed)
Your physician wants you to follow-up in: 3 months with an extender and 6 months with Dr Berry. You will receive a reminder letter in the mail two months in advance. If you don't receive a letter, please call our office to schedule the follow-up appointment.  

## 2013-04-22 NOTE — Assessment & Plan Note (Signed)
I saw Mr. Coller back in 93/73/42  at which time he was complaining of increasing dyspnea on exertion and occasional substernal chest pain rating left upper extremity. He did tell me that the chest pain has not changed in frequency or severity since his bypass surgery. Because of this I performed a 2-D echocardiogram which revealed normal LV systolic function with severe concentric left ventricular hypertrophy and a Myoview stress test showed subtle inner apical ischemia though to my eye this appeared to be a low-risk does admit to being deconditioned. I'm not convinced that he has a structural abnormality contributing to his symptoms. At this point I prefer prospective conservative therapy. He is on Cialis and therefore I am not going to add an oral nitrate.

## 2013-04-22 NOTE — Assessment & Plan Note (Signed)
Controlled on current medications 

## 2013-04-22 NOTE — Progress Notes (Signed)
10/21/1515 Lucas Morales   [birth year]  [id number]  Primary Physician Horatio Pel, MD Primary Cardiologist: Lorretta Harp MD [name]   HPI:  The patient is a 80 year old, moderately overweight, married, Caucasian male, father of two, who I last saw in the office approximately five weeks ago. He has a history of hypertension and hyperlipidemia. He was initially referred to me because of dyspnea. A CT angiogram of his heart performed three years prior showed moderate plaquing in all three coronary arteries with a calcium score of 800, and a 2D echo revealed an EF of 45 to 50%. Because of increasing symptoms of dyspnea on exertion, as well as chest pain radiating to his left upper extremity, a Myoview stress test was performed on January 04, 2012, which showed inferolateral ischemia. Ultimately he was catheterized by Dr. Glenetta Hew on October 24, revealing two vessel disease, and he underwent coronary artery bypass grafting x4 by Dr. Tharon Aquas Trigt on October 31 with a LIMA to his LAD, vein to a distal LAD, obtuse marginal brach 3 and 4. His postoperative course was complicated by PAF, for which he was placed on Coumadin anticoagulation, Digoxin, and amiodarone. When I saw him back in January, he had a prolonged episode of chest pain. A Myoview stress test performed on January 21 was nonischemic. A MCOT showed no evidence of PAF, and as a result, I stopped his amiodarone, Coumadin, and Digoxin. He participated in cardiac rehab.  He saw Tenny Craw [facility] back in the office in March and July. He's been complaining of progressive dyspnea and chest pain. I did order a Myoview stress test and 2-D echocardiogram which revealed normal LV systolic function with severe concentric left hypertrophy. The Myoview showed subtle interval septal ischemia I do not think were significantly abnormal to warrant further evaluation at this time.    Current Outpatient Prescriptions    Medication Sig Dispense Refill  . albuterol (PROAIR HFA) 108 (90 BASE) MCG/ACT inhaler Inhale 2 puffs into the lungs every 6 (six) hours as needed for wheezing or shortness of breath.  1 Inhaler  5  . aspirin EC 325 MG EC tablet Take 1 tablet (325 mg total) by mouth daily.  30 tablet    . atorvastatin (LIPITOR) 40 MG tablet TAKE 1 TABLET BY MOUTH ONCE DAILY AT 6PM  30 tablet  11  . calcium carbonate (TUMS EX) 750 MG chewable tablet Chew 3-4 tablets by mouth daily.       . carvedilol (COREG) 12.5 MG tablet Take 12.5 mg by mouth 2 (two) times daily.      [name] loperamide (IMODIUM) 2 MG capsule Take 2 mg by mouth as needed.       [name] losartan (COZAAR) 50 MG tablet Take 1 tablet (50 mg total) by mouth daily.  30 tablet  1  . metFORMIN (GLUCOPHAGE) 500 MG tablet TAKE 1 TABLET BY MOUTH TWICE DAILY WITH A MEAL  60 tablet  0  . Multiple Vitamins-Minerals (MULTIVITAMIN WITH MINERALS) tablet Take 1 tablet by mouth daily.        . naproxen (NAPROSYN) 500 MG tablet Take 500 mg by mouth as needed. Take 3 pills prn gout attack      . Omega-3 Fatty Acids (FISH OIL) 1200 MG CAPS Take 2,400 mg by mouth 2 (two) times daily.      . pantoprazole (PROTONIX) 40 MG tablet Take 40 mg by mouth as needed.      . tadalafil (CIALIS) 5  MG tablet Take 5 mg by mouth daily.       No current facility-administered medications for this visit.    Allergies  Allergen Reactions  . Morphine Itching  . Desmopressin Itching  . Pantoprazole Itching    History   Social History  . Marital Status: Married    Spouse Name: N/A    Number of Children: 2  . Years of Education: N/A   Occupational History  . Retired    Social History Main Topics  . Smoking status: Never Smoker   . Smokeless tobacco: Never Used  . Alcohol Use: Yes     Comment: 09/30/2012 - has small glass of wine every other day;01/11/2012 "last alcohol > 2 yr ago; neer had problem w/it"   . Drug Use: No  . Sexual Activity: Yes   Other Topics Concern  . Not on  file   Social History Narrative  . No narrative on file     Review of Systems: General: negative for chills, fever, night sweats or weight changes.  Cardiovascular: negative for chest pain, dyspnea on exertion, edema, orthopnea, palpitations, paroxysmal nocturnal dyspnea or shortness of breath Dermatological: negative for rash Respiratory: negative for cough or wheezing Urologic: negative for hematuria Abdominal: negative for nausea, vomiting, diarrhea, bright red blood per rectum, melena, or hematemesis Neurologic: negative for visual changes, syncope, or dizziness All other systems reviewed and are otherwise negative except as noted above.    Blood pressure 124/72, pulse 70, height 6\' 3"  (1.905 m), weight 258 lb 6.4 oz (117.209 kg).  General appearance: alert and no distress Neck: no adenopathy, no JVD, supple, symmetrical, trachea midline, thyroid not enlarged, symmetric, no tenderness/mass/nodules and ssoft right carotid artery Lungs: clear to auscultation bilaterally Heart: regular rate and rhythm, S1, S2 normal, no murmur, click, rub or gallop Extremities: extremities normal, atraumatic, no cyanosis or edema  EKG not performed today  ASSESSMENT AND PLAN:   S/P CABG x 4, elective, 93/81/01 I saw Lucas Morales back in 75/10/25  at which time he was complaining of increasing dyspnea on exertion and occasional substernal chest pain rating left upper extremity. He did tell me that the chest pain has not changed in frequency or severity since his bypass surgery. Because of this I performed a 2-D echocardiogram which revealed normal LV systolic function with severe concentric left ventricular hypertrophy and a Myoview stress test showed subtle inner apical ischemia though to my eye this appeared to be a low-risk does admit to being deconditioned. I'm not convinced that he has a structural abnormality contributing to his symptoms. At this point I prefer prospective conservative therapy.  He is on Cialis and therefore I am not going to add an oral nitrate.  Hypertension Controlled on current medications.  Hyperlipidemia On statin therapy followed by his PCP. We'll obtain a copy of this for her records      Lorretta Harp MD [facility], [facility] 04/22/2013 9:11 AM

## 2013-04-29 ENCOUNTER — Ambulatory Visit: Payer: Medicare PPO | Admitting: Cardiovascular Disease

## 2013-05-14 ENCOUNTER — Encounter: Payer: Self-pay | Admitting: Pulmonary Disease

## 2013-05-14 ENCOUNTER — Ambulatory Visit (INDEPENDENT_AMBULATORY_CARE_PROVIDER_SITE_OTHER): Payer: Medicare PPO | Admitting: Pulmonary Disease

## 2013-05-14 VITALS — BP 108/64 | HR 61 | Ht 74.5 in | Wt 255.0 lb

## 2013-05-14 DIAGNOSIS — R0602 Shortness of breath: Secondary | ICD-10-CM

## 2013-05-14 DIAGNOSIS — G4733 Obstructive sleep apnea (adult) (pediatric): Secondary | ICD-10-CM

## 2013-05-14 DIAGNOSIS — R058 Other specified cough: Secondary | ICD-10-CM

## 2013-05-14 DIAGNOSIS — R059 Cough, unspecified: Secondary | ICD-10-CM

## 2013-05-14 DIAGNOSIS — R0989 Other specified symptoms and signs involving the circulatory and respiratory systems: Secondary | ICD-10-CM

## 2013-05-14 DIAGNOSIS — R0683 Snoring: Secondary | ICD-10-CM

## 2013-05-14 DIAGNOSIS — R05 Cough: Secondary | ICD-10-CM

## 2013-05-14 DIAGNOSIS — R0609 Other forms of dyspnea: Secondary | ICD-10-CM

## 2013-05-14 NOTE — Patient Instructions (Addendum)
Can try nasal irrigation (saline nasal spray) daily as needed for post-nasal drip Can try nasacort two sprays in each nostril as needed for post-nasal drip >> use after nasal irrigation Proair two puffs up to four times per day as needed for cough, wheeze, or chest congestion Will arrange for sleep study Will call to arrange for follow up after sleep study reviewed

## 2013-05-14 NOTE — Progress Notes (Signed)
Chief Complaint  Patient presents with  . Follow-up    Pt c/o SOB, worsened by exertion.  Also c/o prod cough with clear mucous, especially in a.m.      CC: Lucas Morales  History of Present Illness: Lucas Morales is a 80 y.o. male never smoker with dyspnea and snoring.  He continues to have trouble with his breathing.  He has not used inhalers much >> he was not sure when he should use it.  He has post-nasal drip and cough with clear sputum in the morning.  He denies chest pain, palpitations, or leg swelling.  He developed problems with itching around his legs.  There was concern this could be medication reaction (?protonix, coreg).  These medications have been stopped, and his itching is improving.  Unfortunately, since he stopped protonix he has noticed more trouble with his reflux.  He continues to snore, and feels his sleep and his breathing at night has gotten worse.  He was seen by Dr. Gwenlyn Found earlier this month, and was advised to continue current regimen.  Tests: 01/04/12 Echo >> mod/severe LVH, EF 55%, mod/severe LA dilation, mod MR 01/12/12 PFT >> FEV1 3.33 (101%), FEV1% 72, TLC 6.98 (93%), DLCO 86% PFT 09/18/12 >> FEV1 3.47 (101%), FEV1% 77, TLC 7.34 (95%), DLCO 60%, no BD Myocardial perfusion 03/17/13 >> Intermediate risk stress nuclear study with a small area of anteroseptal reversibility. There is a fixed inferoapical defect. Echo 04/08/13 >> EF 55 to 60%, severe LVH, mild MR, mod LA dilation  Lucas Morales  has a past medical history of Adenomatous colon polyp; Hemorrhoids; Diverticulosis; Gastritis; IC (interstitial cystitis); Esophageal stricture; Diverticulitis; Hyperlipidemia; Anginal pain (01/11/2012); Bronchitis; Pre-diabetes; Gout; Dysrhythmia; Coronary artery disease; Hypertension; Diabetes mellitus without complication; History of bronchitis; Joint pain; Joint swelling; Gout; GERD (gastroesophageal reflux disease); History of colon polyps; Urinary frequency;  Interstitial cystitis; Depression; Nocturia; Insomnia; Shortness of breath (01/11/2012); Hiatal hernia; Esophageal dysmotility; Arthritis; Paroxysmal atrial fibrillation; Hyperlipidemia; and Dyspnea on exertion.  Lucas Morales  has past surgical history that includes Reconstruction medial collateral ligament elbow w/ tendon graft (1988 X 2); Total knee arthroplasty (~ 2001; 2011); bladder lesion removed; Exploratory laparotomy (2009); Tonsillectomy and adenoidectomy (~2774); Appendectomy (2009); Cholecystectomy (2009); Lumbar disc surgery (?2010); Cystoscopy with ureteroscopy (2008; 2009); Cataract extraction w/ intraocular lens  implant, bilateral (2011); Tumor excision (12/2011); Elbow surgery (1988); Colectomy; heel spurs removed (25+yrs ago); Colonoscopy; Esophagogastroduodenoscopy; Coronary artery bypass graft (01/18/2012); Endoharvest vein of greater saphenous vein (01/18/2012); Cardiac catheterization (01/11/2012); Joint replacement (90's and 2000's); Pars plana vitrectomy w/ repair of macular hole (Right, 10/08/2012); and 25 gauge pars plana vitrectomy with 20 gauge mvr port for macular hole (Right, 10/08/2012).  Prior to Admission medications   Medication Sig Start Date End Date Taking? Authorizing Provider  aspirin EC 325 MG EC tablet Take 1 tablet (325 mg total) by mouth daily. 01/22/12  Yes Erin Barrett, PA-C  atorvastatin (LIPITOR) 40 MG tablet Take 1 tablet (40 mg total) by mouth daily at 6 PM. 01/26/12  Yes Donielle Liston Alba, PA-C  calcium carbonate (TUMS EX) 750 MG chewable tablet Chew 2 tablets by mouth daily.    Yes Historical Provider, MD  loperamide (IMODIUM) 2 MG capsule Take 2 mg by mouth as needed.    Yes Historical Provider, MD  losartan (COZAAR) 50 MG tablet Take 1 tablet (50 mg total) by mouth daily. 01/26/12  Yes Donielle Liston Alba, PA-C  metFORMIN (GLUCOPHAGE) 500 MG tablet Take 1 tablet (500 mg total)  by mouth 2 (two) times daily with a meal. 01/26/12  Yes Donielle Liston Alba, PA-C  metoprolol tartrate (LOPRESSOR) 25 MG tablet Take 1 tablet (25 mg total) by mouth 2 (two) times daily. 01/26/12  Yes Donielle Liston Alba, PA-C  mometasone (NASONEX) 50 MCG/ACT nasal spray Place 2 sprays into the nose 2 (two) times daily.    Yes Historical Provider, MD  Multiple Vitamins-Minerals (MULTIVITAMIN WITH MINERALS) tablet Take 1 tablet by mouth daily.     Yes Historical Provider, MD  naproxen (NAPROSYN) 500 MG tablet Take 500 mg by mouth as needed. Take 3 pills prn gout attack   Yes Historical Provider, MD  nortriptyline (PAMELOR) 10 MG capsule Take 10 mg by mouth at bedtime. Take 10 to 30 mg for sleep   Yes Historical Provider, MD  OVER THE COUNTER MEDICATION Fiber Well--2 tabs dailt   Yes Historical Provider, MD  pantoprazole (PROTONIX) 40 MG tablet Take 40 mg by mouth 2 (two) times daily.    Yes Historical Provider, MD  tadalafil (CIALIS) 10 MG tablet Take 10 mg by mouth daily as needed. Take daily for interstitial cystitis   Yes Historical Provider, MD  MOVIPREP [place] as directed / no substitutions 06/25/12   Lafayette Dragon, MD    Allergies  Allergen Reactions  . Morphine Itching  . Desmopressin Itching  . Pantoprazole Itching    Physical Exam:  General - No distress ENT - No sinus tenderness, no oral exudate, no LAN Cardiac - s1s2 regular, no murmur Chest - No wheeze/rales/dullness, good air entry, normal respiratory excursion Back - No focal tenderness Abd - Soft, non-tender Ext - No edema Neuro - Normal strength Skin - No rashes Psych - Normal mood, and behavior  Assessment/Plan:  Lucas Mires, MD [place] [facility]/Critical Care/Sleep Pager:  [phone]

## 2013-05-15 ENCOUNTER — Encounter (INDEPENDENT_AMBULATORY_CARE_PROVIDER_SITE_OTHER): Payer: Medicare PPO | Admitting: Ophthalmology

## 2013-05-16 ENCOUNTER — Encounter (INDEPENDENT_AMBULATORY_CARE_PROVIDER_SITE_OTHER): Payer: Medicare PPO | Admitting: Ophthalmology

## 2013-05-16 DIAGNOSIS — H43819 Vitreous degeneration, unspecified eye: Secondary | ICD-10-CM

## 2013-05-16 DIAGNOSIS — H35039 Hypertensive retinopathy, unspecified eye: Secondary | ICD-10-CM

## 2013-05-16 DIAGNOSIS — I10 Essential (primary) hypertension: Secondary | ICD-10-CM

## 2013-05-16 DIAGNOSIS — R05 Cough: Secondary | ICD-10-CM | POA: Insufficient documentation

## 2013-05-16 DIAGNOSIS — H35349 Macular cyst, hole, or pseudohole, unspecified eye: Secondary | ICD-10-CM

## 2013-05-16 DIAGNOSIS — H35379 Puckering of macula, unspecified eye: Secondary | ICD-10-CM

## 2013-05-16 DIAGNOSIS — R058 Other specified cough: Secondary | ICD-10-CM | POA: Insufficient documentation

## 2013-05-16 NOTE — Assessment & Plan Note (Signed)
Likely related to deconditioning, and diastolic heart failure.  He may have component of asthma, but this is less convincing.  His nocturnal symptoms could be related to sleep disordered breathing.  He is followed by cardiology for his CAD and HTN.  Explained to him proper use/timing of inhaler therapy >> will continue prn albuterol for now.  Will assess for sleep apnea with sleep study.  Encouraged him to maintain a gradual exercise regimen as tolerated.  Some of his symptoms also likely related to uncertain about definite cause for his dyspnea.

## 2013-05-16 NOTE — Assessment & Plan Note (Signed)
Will have him try nasal irrigation and nasacort.

## 2013-05-16 NOTE — Assessment & Plan Note (Signed)
He continues to have snoring, sleep disruption, and daytime sleepiness.  He has hx of CAD and HTN.  I am concerned that his nocturnal symptoms might be related to sleep apnea.  This could also be contributing to his diastolic dysfunction which in turn could be contributing to his dyspnea.  To further assess will arrange for in lab sleep study.

## 2013-06-18 ENCOUNTER — Ambulatory Visit (HOSPITAL_BASED_OUTPATIENT_CLINIC_OR_DEPARTMENT_OTHER): Payer: Medicare PPO | Attending: Pulmonary Disease | Admitting: Radiology

## 2013-06-18 VITALS — Ht 75.0 in | Wt 250.0 lb

## 2013-06-18 DIAGNOSIS — G4761 Periodic limb movement disorder: Secondary | ICD-10-CM | POA: Insufficient documentation

## 2013-06-18 DIAGNOSIS — G4733 Obstructive sleep apnea (adult) (pediatric): Secondary | ICD-10-CM | POA: Insufficient documentation

## 2013-06-22 ENCOUNTER — Telehealth: Payer: Self-pay | Admitting: Pulmonary Disease

## 2013-06-22 DIAGNOSIS — G4733 Obstructive sleep apnea (adult) (pediatric): Secondary | ICD-10-CM

## 2013-06-22 NOTE — Sleep Study (Signed)
Lucas Morales  NAME: Lucas Morales DATE OF BIRTH:  [birth year] MEDICAL RECORD NUMBER [id number]  LOCATION: [facility]  PHYSICIAN: [name], M.D. DATE OF STUDY: 06/18/2013  SLEEP STUDY TYPE: Nocturnal Polysomnogram               REFERRING PHYSICIAN: [name], MD  INDICATION FOR STUDY:  80 year old male with snoring, sleep disruption, and daytime sleepiness. He has history of diastolic heart failure.  He is referred to the sleep lab for evaluation of hypersomnia with obstructive sleep apnea.  EPWORTH SLEEPINESS SCORE: 9. HEIGHT: 6\' 3"  (190.5 cm)  WEIGHT: 113.399 kg (250 lb)    Body mass index is 31.25 kg/(m^2).  NECK SIZE: 18 in.  MEDICATIONS:  Current Outpatient Prescriptions on File Prior to Visit  Medication Sig Dispense Refill  . albuterol (PROAIR HFA) 108 (90 BASE) MCG/ACT inhaler Inhale 2 puffs into the lungs every 6 (six) hours as needed for wheezing or shortness of breath.  1 Inhaler  5  . aspirin EC 325 MG EC tablet Take 1 tablet (325 mg total) by mouth daily.  30 tablet    . atorvastatin (LIPITOR) 40 MG tablet TAKE 1 TABLET BY MOUTH ONCE DAILY AT 6PM  30 tablet  11  . bisoprolol (ZEBETA) 5 MG tablet Take 1 tablet by mouth 2 (two) times daily.      . calcium carbonate (TUMS EX) 750 MG chewable tablet Chew 3-4 tablets by mouth daily.       [name] loperamide (IMODIUM) 2 MG capsule Take 2 mg by mouth as needed.       [name] losartan (COZAAR) 50 MG tablet Take 1 tablet (50 mg total) by mouth daily.  30 tablet  1  . metFORMIN (GLUCOPHAGE) 500 MG tablet TAKE 1 TABLET BY MOUTH TWICE DAILY WITH A MEAL  60 tablet  0  . Multiple Vitamins-Minerals (MULTIVITAMIN WITH MINERALS) tablet Take 1 tablet by mouth daily.        . naproxen (NAPROSYN) 500 MG tablet Take 500 mg by mouth as needed. Take 3 pills prn gout attack      . tadalafil (CIALIS) 5 MG tablet Take 5 mg by mouth daily.       No current facility-administered medications on file prior to  visit.    SLEEP ARCHITECTURE:  Total recording time: 405.5 minutes.  Total sleep time was: 340 minutes.  Sleep efficiency: 83.8%.  Sleep latency: 4.5 minutes.  REM latency: 109.5 minutes.  Stage N1: 5.4%.  Stage N2: 78.8%.  Stage N3: 0%.  Stage R:  15.7%.  Supine sleep: 18 minutes.  Non-supine sleep: 322 minutes.  RESPIRATORY DATA: Average respiratory rate: 18. Snoring: Moderate. Average AHI: 6.7.   Apnea index: 0.9.  Hypopnea index: 5.8. Obstructive apnea index: 0.7.  Central apnea index: 0.2.  Mixed apnea index: 0. REM AHI: 10.9.  NREM AHI: 6.1. Supine AHI: 18.5. Non-supine AHI: 4.7.  OXYGEN DATA:  Baseline oxygenation: 93%. Lowest SaO2: 85%. Time spent below SaO2 90%: 69.2 minutes. Supplemental oxygen used: None.  CARDIAC DATA:  Average heart rate: 71 beats per minute. Rhythm strip: Sinus rhythm.  MOVEMENT/PARASOMNIA:  Periodic limb movement: 190.6.  Period limb movements with arousals: 11.3. Restroom trips: Two.  IMPRESSION/ RECOMMENDATION:   This study shows evidence for mild obstructive sleep apnea with an AHI of 6.7 and SaO2 low of 85%.    He had a significant increase in his periodic limb movement index.   Additional therapies include  weight loss, CPAP, oral appliance, or surgical evaluation.   [name], M.D. Diplomate, [employer] of Sleep Medicine  ELECTRONICALLY SIGNED ON:  06/22/2013, 4:41 PM [place] PH: [phone]   FX: [phone] [place]

## 2013-06-22 NOTE — Telephone Encounter (Signed)
PSG 06/18/13 >> AHI of 6.7, SaO2 low of 85%.  PLMI 190.6.  Will have my nurse schedule ROV to review results.

## 2013-06-23 NOTE — Telephone Encounter (Signed)
lmtcb x1 

## 2013-06-24 NOTE — Telephone Encounter (Signed)
ROV has been scheduled for 07/14/13 at 3:45pm. Nothing further was needed.

## 2013-07-14 ENCOUNTER — Encounter: Payer: Self-pay | Admitting: Pulmonary Disease

## 2013-07-14 ENCOUNTER — Ambulatory Visit (INDEPENDENT_AMBULATORY_CARE_PROVIDER_SITE_OTHER): Payer: Medicare PPO | Admitting: Pulmonary Disease

## 2013-07-14 VITALS — BP 130/70 | HR 70 | Ht 75.0 in | Wt 253.6 lb

## 2013-07-14 DIAGNOSIS — R05 Cough: Secondary | ICD-10-CM

## 2013-07-14 DIAGNOSIS — G4733 Obstructive sleep apnea (adult) (pediatric): Secondary | ICD-10-CM

## 2013-07-14 DIAGNOSIS — R059 Cough, unspecified: Secondary | ICD-10-CM

## 2013-07-14 DIAGNOSIS — R058 Other specified cough: Secondary | ICD-10-CM

## 2013-07-14 NOTE — Progress Notes (Signed)
Chief Complaint  Patient presents with  . Follow-up    Pt states he is still SOB like before. Pt was dizzy upon standing and weighing in. C/o dyspnea with any activity. C/o cough with clear mucous, cough wores in morning. C/o intermittent sharp CP, pt states cardiologist says its from hiatal hernia.     CC: Lucas Morales  History of Present Illness: Lucas Morales is a 80 y.o. male never smoker with dyspnea and snoring.  He is here to review his sleep study.  This showed mild sleep apnea.  He had trouble with low BP and dizziness >> this was related to medications, and has improved.  He wakes up frequently in dreams.  He has to use the bathroom frequently at night.  Tests: 01/04/12 Echo >> mod/severe LVH, EF 55%, mod/severe LA dilation, mod MR 01/12/12 PFT >> FEV1 3.33 (101%), FEV1% 72, TLC 6.98 (93%), DLCO 86% PFT 09/18/12 >> FEV1 3.47 (101%), FEV1% 77, TLC 7.34 (95%), DLCO 60%, no BD Myocardial perfusion 03/17/13 >> Intermediate risk stress nuclear study with a small area of anteroseptal reversibility. There is a fixed inferoapical defect. Echo 04/08/13 >> EF 55 to 60%, severe LVH, mild MR, mod LA dilation PSG 06/18/13 >> AHI of 6.7, SaO2 low of 85%. PLMI 190.6.  Lucas Morales  has a past medical history of Adenomatous colon polyp; Hemorrhoids; Diverticulosis; Gastritis; IC (interstitial cystitis); Esophageal stricture; Diverticulitis; Hyperlipidemia; Anginal pain (01/11/2012); Bronchitis; Pre-diabetes; Gout; Dysrhythmia; Coronary artery disease; Hypertension; Diabetes mellitus without complication; History of bronchitis; Joint pain; Joint swelling; Gout; GERD (gastroesophageal reflux disease); History of colon polyps; Urinary frequency; Interstitial cystitis; Depression; Nocturia; Insomnia; Shortness of breath (01/11/2012); Hiatal hernia; Esophageal dysmotility; Arthritis; Paroxysmal atrial fibrillation; Hyperlipidemia; and Dyspnea on exertion.  Lucas Morales  has past surgical  history that includes Reconstruction medial collateral ligament elbow w/ tendon graft (1988 X 2); Total knee arthroplasty (~ 2001; 2011); bladder lesion removed; Exploratory laparotomy (2009); Tonsillectomy and adenoidectomy (~1761); Appendectomy (2009); Cholecystectomy (2009); Lumbar disc surgery (?2010); Cystoscopy with ureteroscopy (2008; 2009); Cataract extraction w/ intraocular lens  implant, bilateral (2011); Tumor excision (12/2011); Elbow surgery (1988); Colectomy; heel spurs removed (25+yrs ago); Colonoscopy; Esophagogastroduodenoscopy; Coronary artery bypass graft (01/18/2012); Endoharvest vein of greater saphenous vein (01/18/2012); Cardiac catheterization (01/11/2012); Joint replacement (90's and 2000's); Pars plana vitrectomy w/ repair of macular hole (Right, 10/08/2012); and 25 gauge pars plana vitrectomy with 20 gauge mvr port for macular hole (Right, 10/08/2012).  Prior to Admission medications   Medication Sig Start Date End Date Taking? Authorizing Provider  aspirin EC 325 MG EC tablet Take 1 tablet (325 mg total) by mouth daily. 01/22/12  Yes Erin Barrett, PA-C  atorvastatin (LIPITOR) 40 MG tablet Take 1 tablet (40 mg total) by mouth daily at 6 PM. 01/26/12  Yes Donielle Liston Alba, PA-C  calcium carbonate (TUMS EX) 750 MG chewable tablet Chew 2 tablets by mouth daily.    Yes Historical Provider, MD  loperamide (IMODIUM) 2 MG capsule Take 2 mg by mouth as needed.    Yes Historical Provider, MD  losartan (COZAAR) 50 MG tablet Take 1 tablet (50 mg total) by mouth daily. 01/26/12  Yes Donielle Liston Alba, PA-C  metFORMIN (GLUCOPHAGE) 500 MG tablet Take 1 tablet (500 mg total) by mouth 2 (two) times daily with a meal. 01/26/12  Yes Donielle Liston Alba, PA-C  metoprolol tartrate (LOPRESSOR) 25 MG tablet Take 1 tablet (25 mg total) by mouth 2 (two) times daily. 01/26/12  Yes Donielle Liston Alba,  PA-C  mometasone (NASONEX) 50 MCG/ACT nasal spray Place 2 sprays into the nose 2 (two) times daily.     Yes Historical Provider, MD  Multiple Vitamins-Minerals (MULTIVITAMIN WITH MINERALS) tablet Take 1 tablet by mouth daily.     Yes Historical Provider, MD  naproxen (NAPROSYN) 500 MG tablet Take 500 mg by mouth as needed. Take 3 pills prn gout attack   Yes Historical Provider, MD  nortriptyline (PAMELOR) 10 MG capsule Take 10 mg by mouth at bedtime. Take 10 to 30 mg for sleep   Yes Historical Provider, MD  OVER THE COUNTER MEDICATION Fiber Well--2 tabs dailt   Yes Historical Provider, MD  pantoprazole (PROTONIX) 40 MG tablet Take 40 mg by mouth 2 (two) times daily.    Yes Historical Provider, MD  tadalafil (CIALIS) 10 MG tablet Take 10 mg by mouth daily as needed. Take daily for interstitial cystitis   Yes Historical Provider, MD  MOVIPREP [place] as directed / no substitutions 06/25/12   Lafayette Dragon, MD    Allergies  Allergen Reactions  . Morphine Itching  . Desmopressin Itching    Physical Exam:  General - No distress ENT - No sinus tenderness, no oral exudate, no LAN Cardiac - s1s2 regular, no murmur Chest - No wheeze/rales/dullness, good air entry, normal respiratory excursion Back - No focal tenderness Abd - Soft, non-tender Ext - No edema Neuro - Normal strength Skin - No rashes Psych - Normal mood, and behavior  Assessment/Plan:  Lucas Mires, MD [place] [facility]/Critical Care/Sleep Pager:  [phone]

## 2013-07-14 NOTE — Assessment & Plan Note (Signed)
Continue nasal irrigation and nasacort as needed. 

## 2013-07-14 NOTE — Assessment & Plan Note (Signed)
His sleep study shows mild sleep apnea.  I have reviewed the recent sleep study results with the patient.  We discussed how sleep apnea can affect various health problems including risks for hypertension, cardiovascular disease, and diabetes.  We also discussed how sleep disruption can increase risks for accident, such as while driving.  Weight loss as a means of improving sleep apnea was also reviewed.  Additional treatment options discussed were CPAP therapy, oral appliance, and surgical intervention.  Will arrange for auto CPAP set up.

## 2013-07-14 NOTE — Patient Instructions (Signed)
Will arrange for CPAP set up  Follow up in 2 months after CPAP set up 

## 2013-07-22 ENCOUNTER — Ambulatory Visit: Payer: Medicare PPO | Admitting: Cardiovascular Disease

## 2013-07-30 ENCOUNTER — Ambulatory Visit (INDEPENDENT_AMBULATORY_CARE_PROVIDER_SITE_OTHER): Payer: Medicare PPO | Admitting: Ophthalmology

## 2013-07-30 DIAGNOSIS — H3530 Unspecified macular degeneration: Secondary | ICD-10-CM

## 2013-07-30 DIAGNOSIS — H35349 Macular cyst, hole, or pseudohole, unspecified eye: Secondary | ICD-10-CM

## 2013-07-30 DIAGNOSIS — H35039 Hypertensive retinopathy, unspecified eye: Secondary | ICD-10-CM

## 2013-07-30 DIAGNOSIS — H43819 Vitreous degeneration, unspecified eye: Secondary | ICD-10-CM

## 2013-07-30 DIAGNOSIS — I10 Essential (primary) hypertension: Secondary | ICD-10-CM

## 2013-07-30 DIAGNOSIS — H35379 Puckering of macula, unspecified eye: Secondary | ICD-10-CM

## 2013-08-27 ENCOUNTER — Telehealth: Payer: Self-pay | Admitting: Pulmonary Disease

## 2013-08-27 NOTE — Telephone Encounter (Signed)
Called and spoke with pt and he stated that he has a hard time getting through to [facility]---he stated that his machine is leaking water and has messed up an antique table of his.  He stated that he is not very happy with this service and would like to set up with a new DME company.  Pt will make sure that he is closed out with [facility] and pt was given the number to [facility] to set up with them.  Nothing further is needed.

## 2013-09-05 ENCOUNTER — Encounter: Payer: Self-pay | Admitting: Pulmonary Disease

## 2013-09-15 ENCOUNTER — Ambulatory Visit (INDEPENDENT_AMBULATORY_CARE_PROVIDER_SITE_OTHER): Payer: Medicare PPO | Admitting: Pulmonary Disease

## 2013-09-15 ENCOUNTER — Encounter: Payer: Self-pay | Admitting: Pulmonary Disease

## 2013-09-15 VITALS — BP 128/70 | HR 77 | Ht 75.0 in | Wt 252.6 lb

## 2013-09-15 DIAGNOSIS — R0602 Shortness of breath: Secondary | ICD-10-CM

## 2013-09-15 DIAGNOSIS — R058 Other specified cough: Secondary | ICD-10-CM

## 2013-09-15 DIAGNOSIS — R05 Cough: Secondary | ICD-10-CM

## 2013-09-15 DIAGNOSIS — R059 Cough, unspecified: Secondary | ICD-10-CM

## 2013-09-15 DIAGNOSIS — G4733 Obstructive sleep apnea (adult) (pediatric): Secondary | ICD-10-CM

## 2013-09-15 NOTE — Assessment & Plan Note (Signed)
He has not been able to tolerate CPAP set up, and would not want to continue with this type of therapy.  Will arrange for overnight oxygen test to determine if he could benefit from nocturnal oxygen therapy.

## 2013-09-15 NOTE — Progress Notes (Signed)
Chief Complaint  Patient presents with  . Follow-up    Pt reports that CPAP is not functioning well nor is he tolerating it when he wears it. Pt states that he has not been wearing at all recently. Pt states that [name] is not being of much help either, poor customer service.      CC: Lucas Morales  History of Present Illness: Lucas Morales is a 80 y.o. male never smoker with dyspnea and OSA.  He has not been able to tolerate CPAP.  His mouth gets very dry.  He has tried adjusting the humidifier settings.  He has tried several different masks, and is not able to tolerate these.  He did not get very good service from his DME [name]).  Tests: 01/04/12 Echo >> mod/severe LVH, EF 55%, mod/severe LA dilation, mod MR 01/12/12 PFT >> FEV1 3.33 (101%), FEV1% 72, TLC 6.98 (93%), DLCO 86% PFT 09/18/12 >> FEV1 3.47 (101%), FEV1% 77, TLC 7.34 (95%), DLCO 60%, no BD Myocardial perfusion 03/17/13 >> Intermediate risk stress nuclear study with a small area of anteroseptal reversibility. There is a fixed inferoapical defect. Echo 04/08/13 >> EF 55 to 60%, severe LVH, mild MR, mod LA dilation PSG 06/18/13 >> AHI of 6.7, SaO2 low of 85%. PLMI 190.6. Auto CPAP 08/19/13 to 09/01/13 >> used on 13 of 14 nights with average 4 hrs and 44 min.  Average AHI is 0.4 with median CPAP 5 cm H2O and 95 th percentile CPAP 7.  Lucas Morales  has a past medical history of Adenomatous colon polyp; Hemorrhoids; Diverticulosis; Gastritis; IC (interstitial cystitis); Esophageal stricture; Diverticulitis; Hyperlipidemia; Anginal pain (01/11/2012); Bronchitis; Pre-diabetes; Gout; Dysrhythmia; Coronary artery disease; Hypertension; Diabetes mellitus without complication; History of bronchitis; Joint pain; Joint swelling; Gout; GERD (gastroesophageal reflux disease); History of colon polyps; Urinary frequency; Interstitial cystitis; Depression; Nocturia; Insomnia; Shortness of breath (01/11/2012); Hiatal hernia; Esophageal  dysmotility; Arthritis; Paroxysmal atrial fibrillation; Hyperlipidemia; Dyspnea on exertion; and OSA (obstructive sleep apnea) (10/21/2012).  Lucas Morales  has past surgical history that includes Reconstruction medial collateral ligament elbow w/ tendon graft (1988 X 2); Total knee arthroplasty (~ 2001; 2011); bladder lesion removed; Exploratory laparotomy (2009); Tonsillectomy and adenoidectomy (~1610); Appendectomy (2009); Cholecystectomy (2009); Lumbar disc surgery (?2010); Cystoscopy with ureteroscopy (2008; 2009); Cataract extraction w/ intraocular lens  implant, bilateral (2011); Tumor excision (12/2011); Elbow surgery (1988); Colectomy; heel spurs removed (25+yrs ago); Colonoscopy; Esophagogastroduodenoscopy; Coronary artery bypass graft (01/18/2012); Endoharvest vein of greater saphenous vein (01/18/2012); Cardiac catheterization (01/11/2012); Joint replacement (90's and 2000's); Pars plana vitrectomy w/ repair of macular hole (Right, 10/08/2012); and 25 gauge pars plana vitrectomy with 20 gauge mvr port for macular hole (Right, 10/08/2012).  Prior to Admission medications   Medication Sig Start Date End Date Taking? Authorizing Provider  aspirin EC 325 MG EC tablet Take 1 tablet (325 mg total) by mouth daily. 01/22/12  Yes Erin Barrett, PA-C  atorvastatin (LIPITOR) 40 MG tablet Take 1 tablet (40 mg total) by mouth daily at 6 PM. 01/26/12  Yes Donielle Liston Alba, PA-C  calcium carbonate (TUMS EX) 750 MG chewable tablet Chew 2 tablets by mouth daily.    Yes Historical Provider, MD  loperamide (IMODIUM) 2 MG capsule Take 2 mg by mouth as needed.    Yes Historical Provider, MD  losartan (COZAAR) 50 MG tablet Take 1 tablet (50 mg total) by mouth daily. 01/26/12  Yes Donielle Liston Alba, PA-C  metFORMIN (GLUCOPHAGE) 500 MG tablet Take 1 tablet (500 mg total)  by mouth 2 (two) times daily with a meal. 01/26/12  Yes Donielle Liston Alba, PA-C  metoprolol tartrate (LOPRESSOR) 25 MG tablet Take 1 tablet  (25 mg total) by mouth 2 (two) times daily. 01/26/12  Yes Donielle Liston Alba, PA-C  mometasone (NASONEX) 50 MCG/ACT nasal spray Place 2 sprays into the nose 2 (two) times daily.    Yes Historical Provider, MD  Multiple Vitamins-Minerals (MULTIVITAMIN WITH MINERALS) tablet Take 1 tablet by mouth daily.     Yes Historical Provider, MD  naproxen (NAPROSYN) 500 MG tablet Take 500 mg by mouth as needed. Take 3 pills prn gout attack   Yes Historical Provider, MD  nortriptyline (PAMELOR) 10 MG capsule Take 10 mg by mouth at bedtime. Take 10 to 30 mg for sleep   Yes Historical Provider, MD  OVER THE COUNTER MEDICATION Fiber Well--2 tabs dailt   Yes Historical Provider, MD  pantoprazole (PROTONIX) 40 MG tablet Take 40 mg by mouth 2 (two) times daily.    Yes Historical Provider, MD  tadalafil (CIALIS) 10 MG tablet Take 10 mg by mouth daily as needed. Take daily for interstitial cystitis   Yes Historical Provider, MD  MOVIPREP [place] as directed / no substitutions 06/25/12   Lafayette Dragon, MD    Allergies  Allergen Reactions  . Morphine Itching  . Desmopressin Itching  . Losartan     Itching    Physical Exam:  General - No distress ENT - No sinus tenderness, no oral exudate, no LAN Cardiac - s1s2 regular, no murmur Chest - No wheeze/rales/dullness, good air entry, normal respiratory excursion Back - No focal tenderness Abd - Soft, non-tender Ext - No edema Neuro - Normal strength Skin - No rashes Psych - Normal mood, and behavior  Assessment/Plan:  Lucas Mires, MD [place] [facility]/Critical Care/Sleep Pager:  [phone]

## 2013-09-15 NOTE — Patient Instructions (Signed)
Will have [facility] remove CPAP equipment Will arrange for overnight oxygen test and call with results Follow up in 4 months

## 2013-09-15 NOTE — Assessment & Plan Note (Signed)
Continue nasacort as needed.

## 2013-09-15 NOTE — Assessment & Plan Note (Signed)
Most likely related to diastolic heart failure and deconditioning.  If his breathing symptoms persist, then may need to repeat testing with CXR/PFT/Exercise testing.

## 2013-12-31 ENCOUNTER — Encounter (INDEPENDENT_AMBULATORY_CARE_PROVIDER_SITE_OTHER): Payer: Medicare PPO | Admitting: Ophthalmology

## 2013-12-31 DIAGNOSIS — H35343 Macular cyst, hole, or pseudohole, bilateral: Secondary | ICD-10-CM

## 2013-12-31 DIAGNOSIS — H35373 Puckering of macula, bilateral: Secondary | ICD-10-CM

## 2013-12-31 DIAGNOSIS — I10 Essential (primary) hypertension: Secondary | ICD-10-CM

## 2013-12-31 DIAGNOSIS — H35033 Hypertensive retinopathy, bilateral: Secondary | ICD-10-CM

## 2013-12-31 DIAGNOSIS — H43812 Vitreous degeneration, left eye: Secondary | ICD-10-CM

## 2014-02-26 ENCOUNTER — Encounter (HOSPITAL_COMMUNITY): Payer: Self-pay | Admitting: Cardiology

## 2014-04-03 ENCOUNTER — Emergency Department (HOSPITAL_COMMUNITY): Payer: Medicare PPO

## 2014-04-03 ENCOUNTER — Inpatient Hospital Stay (HOSPITAL_COMMUNITY)
Admission: EM | Admit: 2014-04-03 | Discharge: 2014-04-08 | DRG: 481 | Disposition: A | Discharge status: Skilled Nursing Facility | Payer: Medicare PPO | Attending: Internal Medicine | Admitting: Internal Medicine

## 2014-04-03 DIAGNOSIS — Z7982 Long term (current) use of aspirin: Secondary | ICD-10-CM | POA: Diagnosis not present

## 2014-04-03 DIAGNOSIS — S72002A Fracture of unspecified part of neck of left femur, initial encounter for closed fracture: Secondary | ICD-10-CM | POA: Diagnosis present

## 2014-04-03 DIAGNOSIS — I480 Paroxysmal atrial fibrillation: Secondary | ICD-10-CM | POA: Diagnosis present

## 2014-04-03 DIAGNOSIS — Z981 Arthrodesis status: Secondary | ICD-10-CM

## 2014-04-03 DIAGNOSIS — W010XXA Fall on same level from slipping, tripping and stumbling without subsequent striking against object, initial encounter: Secondary | ICD-10-CM | POA: Diagnosis present

## 2014-04-03 DIAGNOSIS — K449 Diaphragmatic hernia without obstruction or gangrene: Secondary | ICD-10-CM | POA: Diagnosis present

## 2014-04-03 DIAGNOSIS — W19XXXA Unspecified fall, initial encounter: Secondary | ICD-10-CM

## 2014-04-03 DIAGNOSIS — N3010 Interstitial cystitis (chronic) without hematuria: Secondary | ICD-10-CM | POA: Diagnosis present

## 2014-04-03 DIAGNOSIS — D696 Thrombocytopenia, unspecified: Secondary | ICD-10-CM | POA: Diagnosis present

## 2014-04-03 DIAGNOSIS — E119 Type 2 diabetes mellitus without complications: Secondary | ICD-10-CM | POA: Diagnosis present

## 2014-04-03 DIAGNOSIS — I2510 Atherosclerotic heart disease of native coronary artery without angina pectoris: Secondary | ICD-10-CM | POA: Diagnosis present

## 2014-04-03 DIAGNOSIS — I10 Essential (primary) hypertension: Secondary | ICD-10-CM | POA: Diagnosis present

## 2014-04-03 DIAGNOSIS — E785 Hyperlipidemia, unspecified: Secondary | ICD-10-CM | POA: Diagnosis present

## 2014-04-03 DIAGNOSIS — M25552 Pain in left hip: Secondary | ICD-10-CM | POA: Diagnosis present

## 2014-04-03 DIAGNOSIS — Z09 Encounter for follow-up examination after completed treatment for conditions other than malignant neoplasm: Secondary | ICD-10-CM

## 2014-04-03 DIAGNOSIS — E139 Other specified diabetes mellitus without complications: Secondary | ICD-10-CM | POA: Insufficient documentation

## 2014-04-03 DIAGNOSIS — D62 Acute posthemorrhagic anemia: Secondary | ICD-10-CM | POA: Diagnosis not present

## 2014-04-03 DIAGNOSIS — M1990 Unspecified osteoarthritis, unspecified site: Secondary | ICD-10-CM | POA: Diagnosis present

## 2014-04-03 DIAGNOSIS — S7290XA Unspecified fracture of unspecified femur, initial encounter for closed fracture: Secondary | ICD-10-CM | POA: Diagnosis present

## 2014-04-03 DIAGNOSIS — S72142A Displaced intertrochanteric fracture of left femur, initial encounter for closed fracture: Principal | ICD-10-CM | POA: Diagnosis present

## 2014-04-03 DIAGNOSIS — K210 Gastro-esophageal reflux disease with esophagitis: Secondary | ICD-10-CM

## 2014-04-03 DIAGNOSIS — K219 Gastro-esophageal reflux disease without esophagitis: Secondary | ICD-10-CM | POA: Diagnosis present

## 2014-04-03 DIAGNOSIS — Z951 Presence of aortocoronary bypass graft: Secondary | ICD-10-CM

## 2014-04-03 DIAGNOSIS — G4733 Obstructive sleep apnea (adult) (pediatric): Secondary | ICD-10-CM | POA: Diagnosis present

## 2014-04-03 DIAGNOSIS — K222 Esophageal obstruction: Secondary | ICD-10-CM

## 2014-04-03 LAB — CBC WITH DIFFERENTIAL/PLATELET
BASOS ABS: 0 10*3/uL (ref 0.0–0.1)
BASOS PCT: 0 % (ref 0–1)
EOS ABS: 0.1 10*3/uL (ref 0.0–0.7)
EOS PCT: 2 % (ref 0–5)
HCT: 37 % — ABNORMAL LOW (ref 39.0–52.0)
Hemoglobin: 13.2 g/dL (ref 13.0–17.0)
LYMPHS ABS: 1.3 10*3/uL (ref 0.7–4.0)
LYMPHS PCT: 17 % (ref 12–46)
MCH: 33.2 pg (ref 26.0–34.0)
MCHC: 35.7 g/dL (ref 30.0–36.0)
MCV: 93 fL (ref 78.0–100.0)
MONOS PCT: 6 % (ref 3–12)
Monocytes Absolute: 0.4 10*3/uL (ref 0.1–1.0)
NEUTROS ABS: 5.7 10*3/uL (ref 1.7–7.7)
Neutrophils Relative %: 75 % (ref 43–77)
Platelets: 136 10*3/uL — ABNORMAL LOW (ref 150–400)
RBC: 3.98 MIL/uL — AB (ref 4.22–5.81)
RDW: 14.6 % (ref 11.5–15.5)
WBC: 7.6 10*3/uL (ref 4.0–10.5)

## 2014-04-03 LAB — TYPE AND SCREEN
ABO/RH(D): A POS
Antibody Screen: NEGATIVE

## 2014-04-03 LAB — BASIC METABOLIC PANEL
Anion gap: 8 (ref 5–15)
BUN: 21 mg/dL (ref 6–23)
CO2: 23 mmol/L (ref 19–32)
Calcium: 9.4 mg/dL (ref 8.4–10.5)
Chloride: 107 mEq/L (ref 96–112)
Creatinine, Ser: 0.95 mg/dL (ref 0.50–1.35)
GFR calc Af Amer: >90 mL/min (ref >90)
GFR, EST NON AFRICAN AMERICAN: 78 mL/min — AB (ref >90)
GLUCOSE: 168 mg/dL — AB (ref 70–99)
Potassium: 3.9 mmol/L (ref 3.5–5.1)
SODIUM: 138 mmol/L (ref 135–145)

## 2014-04-03 LAB — PROTIME-INR
INR: 1.25 (ref 0.00–1.49)
Prothrombin Time: 15.8 seconds — ABNORMAL HIGH (ref 11.6–15.2)

## 2014-04-03 MED ORDER — HYDROMORPHONE HCL 1 MG/ML IJ SOLN
1.0000 mg | INTRAMUSCULAR | Status: AC | PRN
  Administered 2014-04-03: 1 mg via INTRAVENOUS
  Filled 2014-04-03: qty 1

## 2014-04-03 MED ORDER — SODIUM CHLORIDE 0.9 % IV SOLN
INTRAVENOUS | Status: DC
  Administered 2014-04-04: 03:00:00 via INTRAVENOUS

## 2014-04-03 MED ORDER — NAPROXEN 500 MG PO TABS
500.0000 mg | ORAL_TABLET | Freq: Two times a day (BID) | ORAL | Status: DC | PRN
  Filled 2014-04-03: qty 1

## 2014-04-03 MED ORDER — CARVEDILOL 6.25 MG PO TABS
6.2500 mg | ORAL_TABLET | Freq: Two times a day (BID) | ORAL | Status: DC
  Administered 2014-04-03 – 2014-04-08 (×10): 6.25 mg via ORAL
  Filled 2014-04-03 (×13): qty 1

## 2014-04-03 MED ORDER — HYDROMORPHONE HCL 1 MG/ML IJ SOLN
1.0000 mg | Freq: Once | INTRAMUSCULAR | Status: AC
Start: 2014-04-03 — End: 2014-04-03
  Administered 2014-04-03: 1 mg via INTRAVENOUS
  Filled 2014-04-03: qty 1

## 2014-04-03 MED ORDER — FAMOTIDINE IN NACL 20-0.9 MG/50ML-% IV SOLN
20.0000 mg | Freq: Two times a day (BID) | INTRAVENOUS | Status: DC
  Administered 2014-04-03: 20 mg via INTRAVENOUS
  Filled 2014-04-03: qty 50

## 2014-04-03 MED ORDER — FIBER COMPLETE PO TABS: 1.0000 | ORAL_TABLET | Freq: Every day | ORAL | Status: DC

## 2014-04-03 MED ORDER — ONDANSETRON HCL 4 MG/2ML IJ SOLN: 4.0000 mg | Freq: Three times a day (TID) | INTRAMUSCULAR | Status: DC | PRN

## 2014-04-03 MED ORDER — CALCIUM POLYCARBOPHIL 625 MG PO TABS
625.0000 mg | ORAL_TABLET | Freq: Every day | ORAL | Status: DC
  Administered 2014-04-05 – 2014-04-08 (×4): 625 mg via ORAL
  Filled 2014-04-03 (×6): qty 1

## 2014-04-03 MED ORDER — CALCIUM CARBONATE ANTACID 500 MG PO CHEW
3.0000 | CHEWABLE_TABLET | Freq: Every day | ORAL | Status: DC
  Administered 2014-04-05 – 2014-04-08 (×3): 800 mg via ORAL
  Filled 2014-04-03 (×6): qty 4

## 2014-04-03 MED ORDER — HEPARIN SODIUM (PORCINE) 5000 UNIT/ML IJ SOLN
5000.0000 [IU] | Freq: Three times a day (TID) | INTRAMUSCULAR | Status: DC
  Administered 2014-04-04: 5000 [IU] via SUBCUTANEOUS
  Filled 2014-04-03 (×4): qty 1

## 2014-04-03 MED ORDER — LOPERAMIDE HCL 2 MG PO CAPS
6.0000 mg | ORAL_CAPSULE | ORAL | Status: DC | PRN
  Filled 2014-04-03: qty 3

## 2014-04-03 MED ORDER — ADULT MULTIVITAMIN W/MINERALS CH
1.0000 | ORAL_TABLET | Freq: Every day | ORAL | Status: DC
  Administered 2014-04-05 – 2014-04-08 (×4): 1 via ORAL
  Filled 2014-04-03 (×5): qty 1

## 2014-04-03 MED ORDER — HYDROMORPHONE HCL 1 MG/ML IJ SOLN
1.0000 mg | INTRAMUSCULAR | Status: DC | PRN
  Administered 2014-04-03: 1 mg via INTRAVENOUS
  Filled 2014-04-03: qty 1

## 2014-04-03 MED ORDER — ALBUTEROL SULFATE (2.5 MG/3ML) 0.083% IN NEBU: 3.0000 mL | INHALATION_SOLUTION | Freq: Four times a day (QID) | RESPIRATORY_TRACT | Status: DC | PRN

## 2014-04-03 MED ORDER — ASPIRIN EC 325 MG PO TBEC
325.0000 mg | DELAYED_RELEASE_TABLET | Freq: Every day | ORAL | Status: DC
  Filled 2014-04-03: qty 1

## 2014-04-03 MED ORDER — INSULIN ASPART 100 UNIT/ML ~~LOC~~ SOLN
0.0000 [IU] | Freq: Three times a day (TID) | SUBCUTANEOUS | Status: DC
  Administered 2014-04-04: 1 [IU] via SUBCUTANEOUS

## 2014-04-03 MED ORDER — PANTOPRAZOLE SODIUM 40 MG IV SOLR
40.0000 mg | INTRAVENOUS | Status: DC
  Administered 2014-04-03: 40 mg via INTRAVENOUS
  Filled 2014-04-03 (×2): qty 40

## 2014-04-03 MED ORDER — ONDANSETRON HCL 4 MG/2ML IJ SOLN
4.0000 mg | Freq: Once | INTRAMUSCULAR | Status: AC
  Administered 2014-04-03: 4 mg via INTRAVENOUS
  Filled 2014-04-03: qty 2

## 2014-04-03 MED ORDER — ATORVASTATIN CALCIUM 40 MG PO TABS
40.0000 mg | ORAL_TABLET | Freq: Every day | ORAL | Status: DC
  Administered 2014-04-04 – 2014-04-07 (×4): 40 mg via ORAL
  Filled 2014-04-03 (×5): qty 1

## 2014-04-03 MED ORDER — LORATADINE 10 MG PO TABS
10.0000 mg | ORAL_TABLET | Freq: Every day | ORAL | Status: DC
  Administered 2014-04-07: 10 mg via ORAL
  Filled 2014-04-03 (×5): qty 1

## 2014-04-03 NOTE — ED Provider Notes (Signed)
CSN: [id number]     Arrival date & time 04/03/14  2015 History   First MD Initiated Contact with Patient 04/03/14 2055     Chief Complaint  Patient presents with  . Fall  . Hip Pain     (Consider location/radiation/quality/duration/timing/severity/associated sxs/prior Treatment) HPI Comments: Patient is a 81 yo M PMHx significant for hyperlipidemia, paroxysmal atrial fibrillation, hypertension, CAD, arthritis presenting to the emergency department for evaluation of left-sided hip pain after a mechanical fall prior to arrival. Patient states he was walking into a building when he slipped possible causing him to fall. He denies hitting his head or loss of consciousness. He states he unable to ambulate. He states movement and palpation of his left hip aggravate his pain. No modifying factors identified. Denies any numbness or tingling in his extremity. No history of previous left hip injuries. His orthopedist is Dr. Marlou Sa at [facility].   Past Medical History  Diagnosis Date  . Adenomatous colon polyp   . Hemorrhoids   . Diverticulosis   . Gastritis   . IC (interstitial cystitis)   . Esophageal stricture   . Diverticulitis   . Hyperlipidemia     doesn't require meds  . Anginal pain 01/11/2012  . Bronchitis     "I've had it a few times" (01/11/2012)  . Pre-diabetes     "check my sugars q 3 days" (01/11/2012)  . Gout   . Dysrhythmia     bradycardia in the 30's (01/11/2012)  . Coronary artery disease   . Hypertension     takes Losartan daily  . Diabetes mellitus without complication     "borderline"  . History of bronchitis     last time about 3-20yrs ago  . Joint pain   . Joint swelling   . Gout     takes ALlopurinol daily  . GERD (gastroesophageal reflux disease)     takes Pantoprazole daily  . History of colon polyps   . Urinary frequency   . Interstitial cystitis   . Depression     does't require any meds  . Nocturia   . Insomnia     takes Nortrypyline  nightly  . Shortness of breath 01/11/2012    pt states all of the time drSOOD DID PFT RECENTLY  . Hiatal hernia   . Esophageal dysmotility   . Arthritis     "knees" (01/11/2012)  . Paroxysmal atrial fibrillation   . Hyperlipidemia   . Dyspnea on exertion   . OSA (obstructive sleep apnea) 10/21/2012   Past Surgical History  Procedure Laterality Date  . Reconstruction medial collateral ligament elbow w/ tendon graft  1988 X 2    right  . Total knee arthroplasty  ~ 2001; 2011    right; left  . Bladder lesion removed    . Exploratory laparotomy  2009    with right colectomy  . Tonsillectomy and adenoidectomy  ~1943  . Appendectomy  2009  . Cholecystectomy  2009  . Lumbar disc surgery  ?2010    "cleaned out the stenosis" (01/11/2012)  . Cystoscopy with ureteroscopy  2008; 2009    "painted inside of bladder wall"  . Cataract extraction w/ intraocular lens  implant, bilateral  2011  . Tumor excision  12/2011    left arm  . Elbow surgery  1988    right  . Colectomy      d/t diverticulosis  . Heel spurs removed  25+yrs ago    bil   . Colonoscopy    .  Esophagogastroduodenoscopy    . Coronary artery bypass graft  01/18/2012    Procedure: CORONARY ARTERY BYPASS GRAFTING (CABG);  Surgeon: Ivin Poot, MD;  Location: [place];  Service: Open Heart Surgery;  Laterality: N/A;  times four, using left internal mammary artery  . Endovein harvest of greater saphenous vein  01/18/2012    Procedure: ENDOVEIN HARVEST OF GREATER SAPHENOUS VEIN;  Surgeon: Ivin Poot, MD;  Location: [place];  Service: Open Heart Surgery;  Laterality: Right;  . Cardiac catheterization  01/11/2012  . Joint replacement  90's and 2000's    bil knee  . Pars plana vitrectomy w/ repair of macular hole Right 10/08/2012    Dr Zigmund Daniel  . 25 gauge pars plana vitrectomy with 20 gauge mvr port for macular hole Right 10/08/2012    Procedure: 25 GAUGE PARS PLANA VITRECTOMY WITH 20 GAUGE MVR PORT FOR MACULAR HOLE; Membrame  peel, serum patch; Laser treatment ; Gas exchange;  Surgeon: Hayden Pedro, MD;  Location: [place];  Service: Ophthalmology;  Laterality: Right;  . Left heart catheterization with coronary angiogram N/A 01/11/2012    Procedure: LEFT HEART CATHETERIZATION WITH CORONARY ANGIOGRAM;  Surgeon: Leonie Man, MD;  Location: [facility] CATH LAB;  Service: Cardiovascular;  Laterality: N/A;   Family History  Problem Relation Age of Onset  . Colon cancer Neg Hx   . Kidney disease Father   . Emphysema Mother   . Emphysema Maternal Grandfather    History  Substance Use Topics  . Smoking status: Never Smoker   . Smokeless tobacco: Never Used  . Alcohol Use: Yes     Comment: 09/30/2012 - has small glass of wine every other day;01/11/2012 "last alcohol > 2 yr ago; neer had problem w/it"     Review of Systems  Musculoskeletal: Positive for myalgias and arthralgias.  All other systems reviewed and are negative.     Allergies  Morphine; Desmopressin; and Losartan  Home Medications   Prior to Admission medications   Medication Sig Start Date End Date Taking? Authorizing Provider  albuterol (PROAIR HFA) 108 (90 BASE) MCG/ACT inhaler Inhale 2 puffs into the lungs every 6 (six) hours as needed for wheezing or shortness of breath. 07/08/12   [name], MD  aspirin EC 325 MG EC tablet Take 1 tablet (325 mg total) by mouth daily. 01/22/12   Erin Barrett, PA-C  atorvastatin (LIPITOR) 40 MG tablet TAKE 1 TABLET BY MOUTH ONCE DAILY AT 6PM 04/13/13   Lorretta Harp, MD  calcium carbonate (TUMS EX) 750 MG chewable tablet Chew 3-4 tablets by mouth daily.     Historical Provider, MD  Canagliflozin (INVOKANA) 300 MG TABS Take 1 tablet by mouth daily.    Historical Provider, MD  carvedilol (COREG) 12.5 MG tablet Take 0.5 tablets by mouth 2 (two) times daily.    Historical Provider, MD  cetirizine (ZYRTEC) 10 MG tablet Take 10 mg by mouth daily.    Historical Provider, MD  FIBER COMPLETE PO Take by mouth. Up to 6  tabs daily. Taking total of 12g of fiber daily.    Historical Provider, MD  loperamide (IMODIUM) 2 MG capsule Take 6 mg by mouth as needed.     Historical Provider, MD  Multiple Vitamins-Minerals (MULTIVITAMIN WITH MINERALS) tablet Take 1 tablet by mouth daily.      Historical Provider, MD  naproxen (NAPROSYN) 500 MG tablet Take 500 mg by mouth as needed. Take 3 pills prn gout attack    Historical Provider,  MD  pantoprazole (PROTONIX) 40 MG tablet Take 1 tablet by mouth 2 (two) times daily.    Historical Provider, MD  tadalafil (CIALIS) 5 MG tablet Take 5 mg by mouth daily.    Historical Provider, MD   BP 145/77 mmHg  Pulse 71  Temp(Src) 98.3 F (36.8 C) (Oral)  Resp 15  Ht 6' (1.829 m)  Wt 237 lb (107.502 kg)  BMI 32.14 kg/m2  SpO2 96% Physical Exam  Constitutional: He is oriented to person, place, and time. He appears well-developed and well-nourished. No distress.  HENT:  Head: Normocephalic and atraumatic.  Right Ear: External ear normal.  Left Ear: External ear normal.  Nose: Nose normal.  Mouth/Throat: Oropharynx is clear and moist.  Eyes: Conjunctivae are normal.  Neck: Normal range of motion. Neck supple.  Cardiovascular: Normal rate, regular rhythm, normal heart sounds and intact distal pulses.   Pulmonary/Chest: Effort normal and breath sounds normal.  Abdominal: Soft.  Musculoskeletal: He exhibits no edema.  Left leg is shortened and externally rotated.  Neurological: He is alert and oriented to person, place, and time.  Sensation grossly intact.  Skin: Skin is warm and dry. He is not diaphoretic.  Psychiatric: He has a normal mood and affect.  Nursing note and vitals reviewed.   ED Course  Procedures (including critical care time) Medications  ondansetron (ZOFRAN) injection 4 mg (not administered)  loratadine (CLARITIN) tablet 10 mg (not administered)  carvedilol (COREG) tablet 6.25 mg (6.25 mg Oral Given 04/03/14 2354)  atorvastatin (LIPITOR) tablet 40 mg (not  administered)  albuterol (PROVENTIL) (2.5 MG/3ML) 0.083% nebulizer solution 3 mL (not administered)  naproxen (NAPROSYN) tablet 500 mg (not administered)  aspirin EC tablet 325 mg (not administered)  loperamide (IMODIUM) capsule 6 mg (not administered)  calcium carbonate (TUMS - dosed in mg elemental calcium) chewable tablet 600-800 mg of elemental calcium (not administered)  multivitamin with minerals tablet 1 tablet (not administered)  pantoprazole (PROTONIX) injection 40 mg (40 mg Intravenous Given 04/03/14 2353)  insulin aspart (novoLOG) injection 0-9 Units (not administered)  heparin injection 5,000 Units (not administered)  0.9 %  sodium chloride infusion (not administered)  polycarbophil (FIBERCON) tablet 625 mg (not administered)  ondansetron (ZOFRAN) injection 4 mg (4 mg Intravenous Given 04/03/14 2130)  HYDROmorphone (DILAUDID) injection 1 mg (1 mg Intravenous Given 04/03/14 2229)  HYDROmorphone (DILAUDID) injection 1 mg (1 mg Intravenous Given 04/03/14 2353)    Labs Review Labs Reviewed  BASIC METABOLIC PANEL - Abnormal; Notable for the following:    Glucose, Bld 168 (*)    GFR calc non Af Amer 78 (*)    All other components within normal limits  CBC WITH DIFFERENTIAL - Abnormal; Notable for the following:    RBC 3.98 (*)    HCT 37.0 (*)    Platelets 136 (*)    All other components within normal limits  PROTIME-INR - Abnormal; Notable for the following:    Prothrombin Time 15.8 (*)    All other components within normal limits  BASIC METABOLIC PANEL  CBC  HEMOGLOBIN A1C  LIPID PANEL  TYPE AND SCREEN    Imaging Review Dg Pelvis Portable  04/03/2014   CLINICAL DATA:  Initial encounter for fall today with injured left hip. extreme pain.  EXAM: PORTABLE PELVIS 1-2 VIEWS  COMPARISON:  04/30/2009 CTA  FINDINGS: Comminuted intratrochanteric left femur fracture with varus angulation. No dislocation. Remainder of pelvis intact.  IMPRESSION: Proximal left femur fracture.    Electronically Signed   By: Marylyn Ishihara  Jobe Igo M.D.   On: 04/03/2014 20:59   Dg Chest Port 1 View  04/03/2014   CLINICAL DATA:  Status post fall; concern for chest injury. Initial encounter.  EXAM: PORTABLE CHEST - 1 VIEW  COMPARISON:  Chest radiograph from 10/21/2012  FINDINGS: The lungs are well-aerated. Minimal bibasilar atelectasis is noted. There is no evidence of pleural effusion or pneumothorax.  The cardiomediastinal silhouette is borderline normal in size. The patient is status post median sternotomy, with evidence of prior CABG. No acute osseous abnormalities are seen. A moderate hiatal hernia is noted.  Apparent cervical spinal fusion hardware is noted. This is somewhat unusual in appearance, likely reflecting patient rotation; would correlate with the patient's history to ensure that this reflects cervical spinal hardware.  IMPRESSION: 1. Minimal bibasilar atelectasis noted. Lungs otherwise grossly clear. 2. No displaced rib fracture seen. 3. Moderate hiatal hernia noted. 4. Apparent cervical spinal fusion hardware noted. This is somewhat unusual in appearance, likely reflecting patient rotation. Would correlate with the patient's history to ensure that this reflects cervical spinal hardware.   Electronically Signed   By: Garald Balding M.D.   On: 04/03/2014 21:52     EKG Interpretation None      9:30 PM Discussed patient with Dr. Ninfa Linden.   MDM   Final diagnoses:  Closed left hip fracture, initial encounter    Filed Vitals:   04/03/14 2230  BP: 145/77  Pulse: 71  Temp:   Resp: 15     Afebrile, NAD, non-toxic appearing, AAOx4.  I have reviewed nursing notes, vital signs, and all appropriate lab and imaging results for this patient. Neurovascularly intact. Normal sensation. No evidence of compartment syndrome. Patient with closed left hip fracture will admit to hospitalist service. Orthopedics consulted. Patient d/w with Dr. Winfred Leeds, agrees with plan.    Harlow Mares, PA-C 04/04/14 0113  Orlie Dakin, MD 04/04/14 [phone]

## 2014-04-03 NOTE — ED Notes (Signed)
Ortho MD at bedside.

## 2014-04-03 NOTE — Consult Note (Signed)
Reason for Consult:  Left hip pain with known intertrochanteric hip fracture Referring Physician: EDP  Lucas Morales is an 81 y.o. male.  HPI:   81 yo male who slipped on wet concrete and landed on his left hip sustaining a left hip intertrochanteric fracture.  He reports significant left hip pain and states that this is his only injury.  His daughter is at the bedside.  Past Medical History  Diagnosis Date  . Adenomatous colon polyp   . Hemorrhoids   . Diverticulosis   . Gastritis   . IC (interstitial cystitis)   . Esophageal stricture   . Diverticulitis   . Hyperlipidemia     doesn't require meds  . Anginal pain 01/11/2012  . Bronchitis     "I've had it a few times" (01/11/2012)  . Pre-diabetes     "check my sugars q 3 days" (01/11/2012)  . Gout   . Dysrhythmia     bradycardia in the 30's (01/11/2012)  . Coronary artery disease   . Hypertension     takes Losartan daily  . Diabetes mellitus without complication     "borderline"  . History of bronchitis     last time about 3-68yr ago  . Joint pain   . Joint swelling   . Gout     takes ALlopurinol daily  . GERD (gastroesophageal reflux disease)     takes Pantoprazole daily  . History of colon polyps   . Urinary frequency   . Interstitial cystitis   . Depression     does't require any meds  . Nocturia   . Insomnia     takes Nortrypyline nightly  . Shortness of breath 01/11/2012    pt states all of the time drSOOD DID PFT RECENTLY  . Hiatal hernia   . Esophageal dysmotility   . Arthritis     "knees" (01/11/2012)  . Paroxysmal atrial fibrillation   . Hyperlipidemia   . Dyspnea on exertion   . OSA (obstructive sleep apnea) 10/21/2012    Past Surgical History  Procedure Laterality Date  . Reconstruction medial collateral ligament elbow w/ tendon graft  1988 X 2    right  . Total knee arthroplasty  ~ 2001; 2011    right; left  . Bladder lesion removed    . Exploratory laparotomy  2009    with right  colectomy  . Tonsillectomy and adenoidectomy  ~1943  . Appendectomy  2009  . Cholecystectomy  2009  . Lumbar disc surgery  ?2010    "cleaned out the stenosis" (01/11/2012)  . Cystoscopy with ureteroscopy  2008; 2009    "painted inside of bladder wall"  . Cataract extraction w/ intraocular lens  implant, bilateral  2011  . Tumor excision  12/2011    left arm  . Elbow surgery  1988    right  . Colectomy      d/t diverticulosis  . Heel spurs removed  25+yrs ago    bil   . Colonoscopy    . Esophagogastroduodenoscopy    . Coronary artery bypass graft  01/18/2012    Procedure: CORONARY ARTERY BYPASS GRAFTING (CABG);  Surgeon: PIvin Poot MD;  [place]  Service: Open Heart Surgery;  Laterality: N/A;  times four, using left internal mammary artery  . Endovein harvest of greater saphenous vein  01/18/2012    Procedure: ENDOVEIN HARVEST OF GREATER SAPHENOUS VEIN;  Surgeon: PIvin Poot MD;  Location: [place]  Service: Open Heart Surgery;  Laterality: Right;  . Cardiac catheterization  01/11/2012  . Joint replacement  90's and 2000's    bil knee  . Pars plana vitrectomy w/ repair of macular hole Right 10/08/2012    Dr Zigmund Daniel  . 25 gauge pars plana vitrectomy with 20 gauge mvr port for macular hole Right 10/08/2012    Procedure: 25 GAUGE PARS PLANA VITRECTOMY WITH 20 GAUGE MVR PORT FOR MACULAR HOLE; Membrame peel, serum patch; Laser treatment ; Gas exchange;  Surgeon: Hayden Pedro, MD;  Location: [place];  Service: Ophthalmology;  Laterality: Right;  . Left heart catheterization with coronary angiogram N/A 01/11/2012    Procedure: LEFT HEART CATHETERIZATION WITH CORONARY ANGIOGRAM;  Surgeon: Leonie Man, MD;  Location: [facility] CATH LAB;  Service: Cardiovascular;  Laterality: N/A;    Family History  Problem Relation Age of Onset  . Colon cancer Neg Hx   . Kidney disease Father   . Emphysema Mother   . Emphysema Maternal Grandfather     Social History:  reports that he  has never smoked. He has never used smokeless tobacco. He reports that he drinks alcohol. He reports that he does not use illicit drugs.  Allergies:  Allergies  Allergen Reactions  . Morphine Itching  . Desmopressin Itching  . Losartan     Itching    Medications: I have reviewed the patient's current medications.  Results for orders placed or performed during the hospital encounter of 04/03/14 (from the past 48 hour(s))  Basic metabolic panel     Status: Abnormal   Collection Time: 04/03/14  9:35 PM  Result Value Ref Range   Sodium 138 135 - 145 mmol/L    Comment: Please note change in reference range.   Potassium 3.9 3.5 - 5.1 mmol/L    Comment: Please note change in reference range.   Chloride 107 96 - 112 mEq/L   CO2 23 19 - 32 mmol/L   Glucose, Bld 168 (H) 70 - 99 mg/dL   BUN 21 6 - 23 mg/dL   Creatinine, Ser 0.95 0.50 - 1.35 mg/dL   Calcium 9.4 8.4 - 10.5 mg/dL   GFR calc non Af Amer 78 (L) >90 mL/min   GFR calc Af Amer >90 >90 mL/min    Comment: (NOTE) The eGFR has been calculated using the CKD EPI equation. This calculation has not been validated in all clinical situations. eGFR's persistently <90 mL/min signify possible Chronic Kidney Disease.    Anion gap 8 5 - 15  CBC WITH DIFFERENTIAL     Status: Abnormal   Collection Time: 04/03/14  9:35 PM  Result Value Ref Range   WBC 7.6 4.0 - 10.5 K/uL   RBC 3.98 (L) 4.22 - 5.81 MIL/uL   Hemoglobin 13.2 13.0 - 17.0 g/dL   HCT 37.0 (L) 39.0 - 52.0 %   MCV 93.0 78.0 - 100.0 fL   MCH 33.2 26.0 - 34.0 pg   MCHC 35.7 30.0 - 36.0 g/dL   RDW 14.6 11.5 - 15.5 %   Platelets 136 (L) 150 - 400 K/uL   Neutrophils Relative % 75 43 - 77 %   Neutro Abs 5.7 1.7 - 7.7 K/uL   Lymphocytes Relative 17 12 - 46 %   Lymphs Abs 1.3 0.7 - 4.0 K/uL   Monocytes Relative 6 3 - 12 %   Monocytes Absolute 0.4 0.1 - 1.0 K/uL   Eosinophils Relative 2 0 - 5 %   Eosinophils Absolute 0.1 0.0 - 0.7 K/uL  Basophils Relative 0 0 - 1 %   Basophils  Absolute 0.0 0.0 - 0.1 K/uL  Protime-INR     Status: Abnormal   Collection Time: 04/03/14  9:35 PM  Result Value Ref Range   Prothrombin Time 15.8 (H) 11.6 - 15.2 seconds   INR 1.25 0.00 - 1.49  Type and screen     Status: None   Collection Time: 04/03/14  9:35 PM  Result Value Ref Range   ABO/RH(D) A POS    Antibody Screen NEG    Sample Expiration 04/06/2014     Dg Pelvis Portable  04/03/2014   CLINICAL DATA:  Initial encounter for fall today with injured left hip. extreme pain.  EXAM: PORTABLE PELVIS 1-2 VIEWS  COMPARISON:  04/30/2009 CTA  FINDINGS: Comminuted intratrochanteric left femur fracture with varus angulation. No dislocation. Remainder of pelvis intact.  IMPRESSION: Proximal left femur fracture.   Electronically Signed   By: Abigail Miyamoto M.D.   On: 04/03/2014 20:59   Dg Chest Port 1 View  04/03/2014   CLINICAL DATA:  Status post fall; concern for chest injury. Initial encounter.  EXAM: PORTABLE CHEST - 1 VIEW  COMPARISON:  Chest radiograph from 10/21/2012  FINDINGS: The lungs are well-aerated. Minimal bibasilar atelectasis is noted. There is no evidence of pleural effusion or pneumothorax.  The cardiomediastinal silhouette is borderline normal in size. The patient is status post median sternotomy, with evidence of prior CABG. No acute osseous abnormalities are seen. A moderate hiatal hernia is noted.  Apparent cervical spinal fusion hardware is noted. This is somewhat unusual in appearance, likely reflecting patient rotation; would correlate with the patient's history to ensure that this reflects cervical spinal hardware.  IMPRESSION: 1. Minimal bibasilar atelectasis noted. Lungs otherwise grossly clear. 2. No displaced rib fracture seen. 3. Moderate hiatal hernia noted. 4. Apparent cervical spinal fusion hardware noted. This is somewhat unusual in appearance, likely reflecting patient rotation. Would correlate with the patient's history to ensure that this reflects cervical spinal  hardware.   Electronically Signed   By: Garald Balding M.D.   On: 04/03/2014 21:52    Review of Systems  All other systems reviewed and are negative.  Blood pressure 143/76, pulse 70, temperature 98.3 F (36.8 C), temperature source Oral, resp. rate 16, height 6' (1.829 m), weight 107.502 kg (237 lb), SpO2 97 %. Physical Exam  Musculoskeletal:       Left hip: He exhibits decreased range of motion, decreased strength, bony tenderness and deformity.  His left leg is shortened and externally rotated.  His left foot is well-perfused and has grossly intact sensation  Assessment/Plan: Left hip with displaced intertrochanteric femur fracture s/p mechanical fall 1)  I have spoken to him in length and he understands fully the recommendation for surgery as well as the risks and benefits involved.  He is graciously being admitted to the Medical Service for clearance for surgery tomorrow 1/16.  Surgery will involve placement of an intramedullary nail/hip screw construct with the goals of decreased pain and early mobility.  It is fine for him to get heparin tonight.  He will be made NPO after midnight in anticipation for surgery tomorrow.  Mcarthur Rossetti 04/03/2014, 10:31 PM

## 2014-04-03 NOTE — H&P (Signed)
[facility] History and Physical  ROHIT DELORIA KKX:[id number] DOB: 06/05/1933 DOA: 04/03/2014  Referring physician: ED physician PCP: Horatio Pel, MD  Specialists:   Chief Complaint: left hip pain after fall  HPI: Lucas Morales is a 81 y.o. male with PMHx significant for hyperlipidemia, hypertension, CAD, arthritis,  presenting to the emergency department for evaluation of left-sided hip pain after a mechanical fall.  Patient reports that was walking into a building when he slipped, causing him to fall at about 7:00 PM. He started have pain over left hip. He denies hitting his head or loss of consciousness. He states he unable to ambulate.   Patient denies fever, chills, fatigue, headaches, cough, chest pain, SOB, abdominal pain, diarrhea, constipation, dysuria, urgency, frequency, hematuria, skin rashes and leg swelling.  Work up in the ED demonstrates Proximal left femur fracture by x-ray. Patient is admitted to inpatient for further evaluation and treatment. Orthopedic surgeon was consulted by ED.  Review of Systems: As presented in the history of presenting illness, rest negative.  Where does patient live?  At home Can patient participate in ADLs? Yes  Allergy:  Allergies  Allergen Reactions  . Morphine Itching  . Desmopressin Itching  . Losartan     Itching    Past Medical History  Diagnosis Date  . Adenomatous colon polyp   . Hemorrhoids   . Diverticulosis   . Gastritis   . IC (interstitial cystitis)   . Esophageal stricture   . Diverticulitis   . Hyperlipidemia     doesn't require meds  . Anginal pain 01/11/2012  . Bronchitis     "I've had it a few times" (01/11/2012)  . Pre-diabetes     "check my sugars q 3 days" (01/11/2012)  . Gout   . Dysrhythmia     bradycardia in the 30's (01/11/2012)  . Coronary artery disease   . Hypertension     takes Losartan daily  . Diabetes mellitus without complication     "borderline"  . History of  bronchitis     last time about 3-85yrs ago  . Joint pain   . Joint swelling   . Gout     takes ALlopurinol daily  . GERD (gastroesophageal reflux disease)     takes Pantoprazole daily  . History of colon polyps   . Urinary frequency   . Interstitial cystitis   . Depression     does't require any meds  . Nocturia   . Insomnia     takes Nortrypyline nightly  . Shortness of breath 01/11/2012    pt states all of the time drSOOD DID PFT RECENTLY  . Hiatal hernia   . Esophageal dysmotility   . Arthritis     "knees" (01/11/2012)  . Paroxysmal atrial fibrillation   . Hyperlipidemia   . Dyspnea on exertion   . OSA (obstructive sleep apnea) 10/21/2012    Past Surgical History  Procedure Laterality Date  . Reconstruction medial collateral ligament elbow w/ tendon graft  1988 X 2    right  . Total knee arthroplasty  ~ 2001; 2011    right; left  . Bladder lesion removed    . Exploratory laparotomy  2009    with right colectomy  . Tonsillectomy and adenoidectomy  ~1943  . Appendectomy  2009  . Cholecystectomy  2009  . Lumbar disc surgery  ?2010    "cleaned out the stenosis" (01/11/2012)  . Cystoscopy with ureteroscopy  2008; 2009    "painted inside  of bladder wall"  . Cataract extraction w/ intraocular lens  implant, bilateral  2011  . Tumor excision  12/2011    left arm  . Elbow surgery  1988    right  . Colectomy      d/t diverticulosis  . Heel spurs removed  25+yrs ago    bil   . Colonoscopy    . Esophagogastroduodenoscopy    . Coronary artery bypass graft  01/18/2012    Procedure: CORONARY ARTERY BYPASS GRAFTING (CABG);  Surgeon: Ivin Poot, MD;  Location: [facility];  Service: Open Heart Surgery;  Laterality: N/A;  times four, using left internal mammary artery  . Endovein harvest of greater saphenous vein  01/18/2012    Procedure: ENDOVEIN HARVEST OF GREATER SAPHENOUS VEIN;  Surgeon: Ivin Poot, MD;  Location: [facility];  Service: Open Heart Surgery;  Laterality:  Right;  . Cardiac catheterization  01/11/2012  . Joint replacement  90's and 2000's    bil knee  . Pars plana vitrectomy w/ repair of macular hole Right 10/08/2012    Dr Zigmund Daniel  . 25 gauge pars plana vitrectomy with 20 gauge mvr port for macular hole Right 10/08/2012    Procedure: 25 GAUGE PARS PLANA VITRECTOMY WITH 20 GAUGE MVR PORT FOR MACULAR HOLE; Membrame peel, serum patch; Laser treatment ; Gas exchange;  Surgeon: Hayden Pedro, MD;  Location: [facility];  Service: Ophthalmology;  Laterality: Right;  . Left heart catheterization with coronary angiogram N/A 01/11/2012    Procedure: LEFT HEART CATHETERIZATION WITH CORONARY ANGIOGRAM;  Surgeon: Leonie Man, MD;  Location: [facility] CATH LAB;  Service: Cardiovascular;  Laterality: N/A;    Social History:  reports that he has never smoked. He has never used smokeless tobacco. He reports that he drinks alcohol. He reports that he does not use illicit drugs.  Family History:  Family History  Problem Relation Age of Onset  . Colon cancer Neg Hx   . Kidney disease Father   . Emphysema Mother   . Emphysema Maternal Grandfather      Prior to Admission medications   Medication Sig Start Date End Date Taking? Authorizing Provider  albuterol (PROAIR HFA) 108 (90 BASE) MCG/ACT inhaler Inhale 2 puffs into the lungs every 6 (six) hours as needed for wheezing or shortness of breath. 07/08/12   [name], MD  aspirin EC 325 MG EC tablet Take 1 tablet (325 mg total) by mouth daily. 01/22/12   Erin Barrett, PA-C  atorvastatin (LIPITOR) 40 MG tablet TAKE 1 TABLET BY MOUTH ONCE DAILY AT 6PM 04/13/13   Lorretta Harp, MD  calcium carbonate (TUMS EX) 750 MG chewable tablet Chew 3-4 tablets by mouth daily.     Historical Provider, MD  Canagliflozin (INVOKANA) 300 MG TABS Take 1 tablet by mouth daily.    Historical Provider, MD  carvedilol (COREG) 12.5 MG tablet Take 0.5 tablets by mouth 2 (two) times daily.    Historical Provider, MD  cetirizine (ZYRTEC) 10  MG tablet Take 10 mg by mouth daily.    Historical Provider, MD  FIBER COMPLETE PO Take by mouth. Up to 6 tabs daily. Taking total of 12g of fiber daily.    Historical Provider, MD  loperamide (IMODIUM) 2 MG capsule Take 6 mg by mouth as needed.     Historical Provider, MD  Multiple Vitamins-Minerals (MULTIVITAMIN WITH MINERALS) tablet Take 1 tablet by mouth daily.      Historical Provider, MD  naproxen (NAPROSYN) 500 MG tablet  Take 500 mg by mouth as needed. Take 3 pills prn gout attack    Historical Provider, MD  pantoprazole (PROTONIX) 40 MG tablet Take 1 tablet by mouth 2 (two) times daily.    Historical Provider, MD  tadalafil (CIALIS) 5 MG tablet Take 5 mg by mouth daily.    Historical Provider, MD    Physical Exam: Filed Vitals:   04/03/14 2024 04/03/14 2030 04/03/14 2100  BP: 151/81 144/74 143/76  Pulse: 72 68 70  Temp: 98.3 F (36.8 C)    TempSrc: Oral    Resp: 14 12 16   Height: 6' (1.829 m)    Weight: 107.502 kg (237 lb)    SpO2: 98% 96% 97%   General: Not in acute distress HEENT:       Eyes: PERRL, EOMI, no scleral icterus       ENT: No discharge from the ears and nose, no pharynx injection, no tonsillar enlargement.        Neck: No JVD, no bruit, no mass felt. Cardiac: S1/S2, RRR, No murmurs, No gallops or rubs Pulm: Good air movement bilaterally. Clear to auscultation bilaterally. No rales, wheezing, rhonchi or rubs. Abd: Soft, nondistended, nontender, no rebound pain, no organomegaly, BS present Ext: No edema bilaterally. 2+DP/PT pulse bilaterally Musculoskeletal: Left leg is shortened and externally rotated. Tenderness over left hip. Skin: No rashes.  Neuro: Alert and oriented X3, cranial nerves II-XII grossly intact, muscle strength 5/5 in all extremeties, sensation to light touch intact.  Psych: Patient is not psychotic, no suicidal or hemocidal ideation.  Labs on Admission:  Basic Metabolic Panel: No results for input(s): NA, K, CL, CO2, GLUCOSE, BUN,  CREATININE, CALCIUM, MG, PHOS in the last 168 hours. Liver Function Tests: No results for input(s): AST, ALT, ALKPHOS, BILITOT, PROT, ALBUMIN in the last 168 hours. No results for input(s): LIPASE, AMYLASE in the last 168 hours. No results for input(s): AMMONIA in the last 168 hours. CBC: No results for input(s): WBC, NEUTROABS, HGB, HCT, MCV, PLT in the last 168 hours. Cardiac Enzymes: No results for input(s): CKTOTAL, CKMB, CKMBINDEX, TROPONINI in the last 168 hours.  BNP (last 3 results) No results for input(s): PROBNP in the last 8760 hours. CBG: No results for input(s): GLUCAP in the last 168 hours.  Radiological Exams on Admission: Dg Pelvis Portable  04/03/2014   CLINICAL DATA:  Initial encounter for fall today with injured left hip. extreme pain.  EXAM: PORTABLE PELVIS 1-2 VIEWS  COMPARISON:  04/30/2009 CTA  FINDINGS: Comminuted intratrochanteric left femur fracture with varus angulation. No dislocation. Remainder of pelvis intact.  IMPRESSION: Proximal left femur fracture.   Electronically Signed   By: Abigail Miyamoto M.D.   On: 04/03/2014 20:59    Assessment/Plan Principal Problem:   Fracture, femur Active Problems:   ESOPHAGEAL STRICTURE   GERD   Hypertension   Hyperlipidemia   CAD -- mLAD 80&-60%, OM2 90%, OM3 70-80%, RPDA 90%, cath 01/11/12   S/P CABG x 4, elective, 01/18/12   OSA (obstructive sleep apnea)   Closed left hip fracture  Left hip with displaced intertrochanteric femur fracture: Orthopedic surgeon was consulted, Dr. Ninfa Linden saw patient, plans to do surgery tomorrow. - will admit to tele bed - Appreciate Dr. Trevor Mace consultation - Pain control: dilaudid prn - follow up ortho recs - NPO after MN - type and cross - INR/PTT  CAD: s/p CABG. no chest pain -Continue aspirin, Lipitor, Coreg,  Diabetes mellitus: Patient is on Invokana at home. Last A1c was 6.2  on 01/12/12. -Switch to sliding scale insulin -Check A1c.  GERD: Patient has a hiatal  hernia. He usually does not laying flat in bed. Today his symptom is worsening because that he has to lay on the bed.  -Protonix  IV  Hypertension: -Continue Coreg  Hyperlipidemia: No LDL on record -Continue Lipitor -Check FLP  DVT ppx: SQ Heparin     Code Status: Full code Family Communication: Yes, patient's   daughter    at bed side Disposition Plan: Admit to inpatient   Date of Service 04/03/2014    Ivor Costa [facility] Pager [phone]  If 7PM-7AM, please contact night-coverage [email or web address] Password [facility] 04/03/2014, 9:53 PM

## 2014-04-03 NOTE — ED Provider Notes (Signed)
20 2:10 PM Patient slipped and fell on wet pavement this afternoon injuring left hip. No other injury. Presently pain is not well controlled. He also complains of burning at epigastrium area which is typical of esophageal reflux he suffers from worse with lying flat. Pepcid, additional hydromorphone intravenously ordered .x-rays viewed by me. Results for orders placed or performed during the hospital encounter of 30/16/01  Basic metabolic panel  Result Value Ref Range   Sodium 138 135 - 145 mmol/L   Potassium 3.9 3.5 - 5.1 mmol/L   Chloride 107 96 - 112 mEq/L   CO2 23 19 - 32 mmol/L   Glucose, Bld 168 (H) 70 - 99 mg/dL   BUN 21 6 - 23 mg/dL   Creatinine, Ser 0.95 0.50 - 1.35 mg/dL   Calcium 9.4 8.4 - 10.5 mg/dL   GFR calc non Af Amer 78 (L) >90 mL/min   GFR calc Af Amer >90 >90 mL/min   Anion gap 8 5 - 15  CBC WITH DIFFERENTIAL  Result Value Ref Range   WBC 7.6 4.0 - 10.5 K/uL   RBC 3.98 (L) 4.22 - 5.81 MIL/uL   Hemoglobin 13.2 13.0 - 17.0 g/dL   HCT 37.0 (L) 39.0 - 52.0 %   MCV 93.0 78.0 - 100.0 fL   MCH 33.2 26.0 - 34.0 pg   MCHC 35.7 30.0 - 36.0 g/dL   RDW 14.6 11.5 - 15.5 %   Platelets 136 (L) 150 - 400 K/uL   Neutrophils Relative % 75 43 - 77 %   Neutro Abs 5.7 1.7 - 7.7 K/uL   Lymphocytes Relative 17 12 - 46 %   Lymphs Abs 1.3 0.7 - 4.0 K/uL   Monocytes Relative 6 3 - 12 %   Monocytes Absolute 0.4 0.1 - 1.0 K/uL   Eosinophils Relative 2 0 - 5 %   Eosinophils Absolute 0.1 0.0 - 0.7 K/uL   Basophils Relative 0 0 - 1 %   Basophils Absolute 0.0 0.0 - 0.1 K/uL  Protime-INR  Result Value Ref Range   Prothrombin Time 15.8 (H) 11.6 - 15.2 seconds   INR 1.25 0.00 - 1.49  Type and screen  Result Value Ref Range   ABO/RH(D) A POS    Antibody Screen PENDING    Sample Expiration 04/06/2014    Dg Pelvis Portable  04/03/2014   CLINICAL DATA:  Initial encounter for fall today with injured left hip. extreme pain.  EXAM: PORTABLE PELVIS 1-2 VIEWS  COMPARISON:  04/30/2009 CTA   FINDINGS: Comminuted intratrochanteric left femur fracture with varus angulation. No dislocation. Remainder of pelvis intact.  IMPRESSION: Proximal left femur fracture.   Electronically Signed   By: Abigail Miyamoto M.D.   On: 04/03/2014 20:59   Dg Chest Port 1 View  04/03/2014   CLINICAL DATA:  Status post fall; concern for chest injury. Initial encounter.  EXAM: PORTABLE CHEST - 1 VIEW  COMPARISON:  Chest radiograph from 10/21/2012  FINDINGS: The lungs are well-aerated. Minimal bibasilar atelectasis is noted. There is no evidence of pleural effusion or pneumothorax.  The cardiomediastinal silhouette is borderline normal in size. The patient is status post median sternotomy, with evidence of prior CABG. No acute osseous abnormalities are seen. A moderate hiatal hernia is noted.  Apparent cervical spinal fusion hardware is noted. This is somewhat unusual in appearance, likely reflecting patient rotation; would correlate with the patient's history to ensure that this reflects cervical spinal hardware.  IMPRESSION: 1. Minimal bibasilar atelectasis noted. Lungs otherwise  grossly clear. 2. No displaced rib fracture seen. 3. Moderate hiatal hernia noted. 4. Apparent cervical spinal fusion hardware noted. This is somewhat unusual in appearance, likely reflecting patient rotation. Would correlate with the patient's history to ensure that this reflects cervical spinal hardware.   Electronically Signed   By: Garald Balding M.D.   On: 04/03/2014 21:52     Orlie Dakin, MD 04/03/14 2220

## 2014-04-03 NOTE — ED Notes (Signed)
Arrives via [facility], mechanical fall in parking lot with deformity to left leg, good CMS, VSS, 250 mcg Fentanyl given pta, no neck/back pain

## 2014-04-04 ENCOUNTER — Inpatient Hospital Stay (HOSPITAL_COMMUNITY): Payer: Medicare PPO

## 2014-04-04 ENCOUNTER — Encounter (HOSPITAL_COMMUNITY)
Admission: EM | Disposition: A | Discharge status: Skilled Nursing Facility | Payer: Self-pay | Source: Home / Self Care | Attending: Internal Medicine

## 2014-04-04 ENCOUNTER — Encounter (HOSPITAL_COMMUNITY): Payer: Self-pay | Admitting: Certified Registered Nurse Anesthetist

## 2014-04-04 ENCOUNTER — Inpatient Hospital Stay (HOSPITAL_COMMUNITY): Payer: Medicare PPO | Admitting: Certified Registered Nurse Anesthetist

## 2014-04-04 DIAGNOSIS — E138 Other specified diabetes mellitus with unspecified complications: Secondary | ICD-10-CM

## 2014-04-04 DIAGNOSIS — I2510 Atherosclerotic heart disease of native coronary artery without angina pectoris: Secondary | ICD-10-CM

## 2014-04-04 HISTORY — PX: COMPRESSION HIP SCREW: SHX1386

## 2014-04-04 HISTORY — PX: INTERTROCHANTERIC HIP FRACTURE SURGERY: SHX681

## 2014-04-04 LAB — CBC
HCT: 36.7 % — ABNORMAL LOW (ref 39.0–52.0)
HEMOGLOBIN: 12.7 g/dL — AB (ref 13.0–17.0)
MCH: 32.9 pg (ref 26.0–34.0)
MCHC: 34.6 g/dL (ref 30.0–36.0)
MCV: 95.1 fL (ref 78.0–100.0)
Platelets: 148 10*3/uL — ABNORMAL LOW (ref 150–400)
RBC: 3.86 MIL/uL — AB (ref 4.22–5.81)
RDW: 14.8 % (ref 11.5–15.5)
WBC: 6.1 10*3/uL (ref 4.0–10.5)

## 2014-04-04 LAB — BASIC METABOLIC PANEL
Anion gap: 9 (ref 5–15)
BUN: 20 mg/dL (ref 6–23)
CHLORIDE: 105 meq/L (ref 96–112)
CO2: 23 mmol/L (ref 19–32)
CREATININE: 0.96 mg/dL (ref 0.50–1.35)
Calcium: 9 mg/dL (ref 8.4–10.5)
GFR calc Af Amer: 90 mL/min — ABNORMAL LOW (ref >90)
GFR calc non Af Amer: 77 mL/min — ABNORMAL LOW (ref >90)
Glucose, Bld: 133 mg/dL — ABNORMAL HIGH (ref 70–99)
Potassium: 4 mmol/L (ref 3.5–5.1)
Sodium: 137 mmol/L (ref 135–145)

## 2014-04-04 LAB — MRSA PCR SCREENING: MRSA BY PCR: NEGATIVE

## 2014-04-04 LAB — HEMOGLOBIN A1C
Hgb A1c MFr Bld: 5.8 % — ABNORMAL HIGH (ref <5.7)
Mean Plasma Glucose: 120 mg/dL — ABNORMAL HIGH (ref <117)

## 2014-04-04 LAB — GLUCOSE, CAPILLARY
GLUCOSE-CAPILLARY: 146 mg/dL — AB (ref 70–99)
GLUCOSE-CAPILLARY: 136 mg/dL — AB (ref 70–99)
GLUCOSE-CAPILLARY: 143 mg/dL — AB (ref 70–99)
Glucose-Capillary: 141 mg/dL — ABNORMAL HIGH (ref 70–99)
Glucose-Capillary: 160 mg/dL — ABNORMAL HIGH (ref 70–99)

## 2014-04-04 LAB — LIPID PANEL
Cholesterol: 89 mg/dL (ref 0–200)
HDL: 23 mg/dL — AB (ref >39)
LDL Cholesterol: 23 mg/dL (ref 0–99)
Total CHOL/HDL Ratio: 3.9 RATIO
Triglycerides: 214 mg/dL — ABNORMAL HIGH (ref <150)
VLDL: 43 mg/dL — ABNORMAL HIGH (ref 0–40)

## 2014-04-04 SURGERY — COMPRESSION HIP
Anesthesia: General | Laterality: Left

## 2014-04-04 MED ORDER — PHENYLEPHRINE HCL 10 MG/ML IJ SOLN
10.0000 mg | INTRAVENOUS | Status: DC | PRN
  Administered 2014-04-04: 80 ug/min via INTRAVENOUS

## 2014-04-04 MED ORDER — METHOCARBAMOL 500 MG PO TABS
500.0000 mg | ORAL_TABLET | Freq: Four times a day (QID) | ORAL | Status: DC | PRN
  Administered 2014-04-04 – 2014-04-05 (×3): 500 mg via ORAL
  Filled 2014-04-04 (×3): qty 1

## 2014-04-04 MED ORDER — METOCLOPRAMIDE HCL 10 MG PO TABS: 5.0000 mg | ORAL_TABLET | Freq: Three times a day (TID) | ORAL | Status: DC | PRN

## 2014-04-04 MED ORDER — FENTANYL CITRATE 0.05 MG/ML IJ SOLN
25.0000 ug | INTRAMUSCULAR | Status: DC | PRN
  Administered 2014-04-04: 50 ug via INTRAVENOUS

## 2014-04-04 MED ORDER — LIDOCAINE HCL (CARDIAC) 20 MG/ML IV SOLN
INTRAVENOUS | Status: AC
Start: 2014-04-04 — End: 2014-04-04
  Filled 2014-04-04: qty 5

## 2014-04-04 MED ORDER — ALBUMIN HUMAN 5 % IV SOLN
INTRAVENOUS | Status: DC | PRN
  Administered 2014-04-04: 10:00:00 via INTRAVENOUS

## 2014-04-04 MED ORDER — DEXTROSE 5 % IV SOLN: 500.0000 mg | Freq: Four times a day (QID) | INTRAVENOUS | Status: DC | PRN

## 2014-04-04 MED ORDER — SUCCINYLCHOLINE CHLORIDE 20 MG/ML IJ SOLN
INTRAMUSCULAR | Status: AC
  Filled 2014-04-04: qty 1

## 2014-04-04 MED ORDER — GLYCOPYRROLATE 0.2 MG/ML IJ SOLN
INTRAMUSCULAR | Status: AC
  Filled 2014-04-04: qty 2

## 2014-04-04 MED ORDER — FENTANYL CITRATE 0.05 MG/ML IJ SOLN
INTRAMUSCULAR | Status: AC
  Filled 2014-04-04: qty 5

## 2014-04-04 MED ORDER — ONDANSETRON HCL 4 MG/2ML IJ SOLN
INTRAMUSCULAR | Status: AC
  Filled 2014-04-04: qty 2

## 2014-04-04 MED ORDER — ROCURONIUM BROMIDE 50 MG/5ML IV SOLN
INTRAVENOUS | Status: AC
Start: 2014-04-04 — End: 2014-04-04
  Filled 2014-04-04: qty 1

## 2014-04-04 MED ORDER — CEFAZOLIN SODIUM-DEXTROSE 2-3 GM-% IV SOLR
2.0000 g | Freq: Four times a day (QID) | INTRAVENOUS | Status: AC
  Administered 2014-04-04 (×2): 2 g via INTRAVENOUS
  Filled 2014-04-04 (×2): qty 50

## 2014-04-04 MED ORDER — CEFAZOLIN SODIUM-DEXTROSE 2-3 GM-% IV SOLR
2.0000 g | Freq: Once | INTRAVENOUS | Status: DC
  Filled 2014-04-04: qty 50

## 2014-04-04 MED ORDER — ONDANSETRON HCL 4 MG PO TABS
4.0000 mg | ORAL_TABLET | Freq: Four times a day (QID) | ORAL | Status: DC | PRN
  Administered 2014-04-05: 4 mg via ORAL
  Filled 2014-04-04: qty 1

## 2014-04-04 MED ORDER — FENTANYL CITRATE 0.05 MG/ML IJ SOLN
INTRAMUSCULAR | Status: DC | PRN
  Administered 2014-04-04: 50 ug via INTRAVENOUS
  Administered 2014-04-04: 75 ug via INTRAVENOUS
  Administered 2014-04-04: 50 ug via INTRAVENOUS
  Administered 2014-04-04: 75 ug via INTRAVENOUS

## 2014-04-04 MED ORDER — SUCCINYLCHOLINE CHLORIDE 20 MG/ML IJ SOLN
INTRAMUSCULAR | Status: DC | PRN
  Administered 2014-04-04: 120 mg via INTRAVENOUS

## 2014-04-04 MED ORDER — ROCURONIUM BROMIDE 100 MG/10ML IV SOLN
INTRAVENOUS | Status: DC | PRN
  Administered 2014-04-04: 20 mg via INTRAVENOUS

## 2014-04-04 MED ORDER — OXYCODONE HCL 5 MG PO TABS
5.0000 mg | ORAL_TABLET | ORAL | Status: DC | PRN
  Administered 2014-04-07: 10 mg via ORAL
  Filled 2014-04-04: qty 1
  Filled 2014-04-04: qty 2

## 2014-04-04 MED ORDER — FENTANYL CITRATE 0.05 MG/ML IJ SOLN
INTRAMUSCULAR | Status: AC
  Filled 2014-04-04: qty 2

## 2014-04-04 MED ORDER — CEFAZOLIN SODIUM-DEXTROSE 2-3 GM-% IV SOLR
INTRAVENOUS | Status: AC
Start: 2014-04-04 — End: 2014-04-04
  Administered 2014-04-04: 2 g via INTRAVENOUS
  Filled 2014-04-04: qty 50

## 2014-04-04 MED ORDER — ALUM & MAG HYDROXIDE-SIMETH 200-200-20 MG/5ML PO SUSP: 30.0000 mL | ORAL | Status: DC | PRN

## 2014-04-04 MED ORDER — MAGIC MOUTHWASH W/LIDOCAINE
2.0000 mL | Freq: Three times a day (TID) | ORAL | Status: DC | PRN
  Administered 2014-04-04: 2 mL via ORAL
  Filled 2014-04-04 (×2): qty 5

## 2014-04-04 MED ORDER — HYDROMORPHONE HCL 1 MG/ML IJ SOLN
0.5000 mg | INTRAMUSCULAR | Status: DC | PRN
  Administered 2014-04-04: 0.5 mg via INTRAVENOUS
  Filled 2014-04-04: qty 1

## 2014-04-04 MED ORDER — ACETAMINOPHEN 650 MG RE SUPP: 650.0000 mg | Freq: Four times a day (QID) | RECTAL | Status: DC | PRN

## 2014-04-04 MED ORDER — GLYCOPYRROLATE 0.2 MG/ML IJ SOLN
INTRAMUSCULAR | Status: DC | PRN
  Administered 2014-04-04: 0.2 mg via INTRAVENOUS
  Administered 2014-04-04: 0.4 mg via INTRAVENOUS

## 2014-04-04 MED ORDER — ONDANSETRON HCL 4 MG/2ML IJ SOLN
INTRAMUSCULAR | Status: DC | PRN
  Administered 2014-04-04: 4 mg via INTRAVENOUS

## 2014-04-04 MED ORDER — NEOSTIGMINE METHYLSULFATE 10 MG/10ML IV SOLN
INTRAVENOUS | Status: AC
  Filled 2014-04-04: qty 1

## 2014-04-04 MED ORDER — SODIUM CHLORIDE 0.9 % IV SOLN
INTRAVENOUS | Status: DC
  Administered 2014-04-04: 18:00:00 via INTRAVENOUS

## 2014-04-04 MED ORDER — LACTATED RINGERS IV SOLN
INTRAVENOUS | Status: DC
  Administered 2014-04-04: 09:00:00 via INTRAVENOUS

## 2014-04-04 MED ORDER — SODIUM CHLORIDE 0.9 % IV SOLN
INTRAVENOUS | Status: DC | PRN
  Administered 2014-04-04: 10:00:00 via INTRAVENOUS

## 2014-04-04 MED ORDER — PROPOFOL 10 MG/ML IV BOLUS
INTRAVENOUS | Status: DC | PRN
  Administered 2014-04-04: 160 mg via INTRAVENOUS

## 2014-04-04 MED ORDER — OXYCODONE HCL 5 MG PO TABS
5.0000 mg | ORAL_TABLET | ORAL | Status: DC | PRN
  Administered 2014-04-04 – 2014-04-06 (×7): 10 mg via ORAL
  Administered 2014-04-07: 5 mg via ORAL
  Filled 2014-04-04 (×8): qty 2

## 2014-04-04 MED ORDER — METOCLOPRAMIDE HCL 5 MG/ML IJ SOLN: 5.0000 mg | Freq: Three times a day (TID) | INTRAMUSCULAR | Status: DC | PRN

## 2014-04-04 MED ORDER — GLYCOPYRROLATE 0.2 MG/ML IJ SOLN
INTRAMUSCULAR | Status: AC
  Filled 2014-04-04: qty 1

## 2014-04-04 MED ORDER — PHENYLEPHRINE HCL 10 MG/ML IJ SOLN
INTRAMUSCULAR | Status: DC | PRN
  Administered 2014-04-04: 80 ug via INTRAVENOUS
  Administered 2014-04-04: 120 ug via INTRAVENOUS

## 2014-04-04 MED ORDER — ACETAMINOPHEN 325 MG PO TABS: 650.0000 mg | ORAL_TABLET | Freq: Four times a day (QID) | ORAL | Status: DC | PRN

## 2014-04-04 MED ORDER — NEOSTIGMINE METHYLSULFATE 10 MG/10ML IV SOLN
INTRAVENOUS | Status: DC | PRN
  Administered 2014-04-04: 2 mg via INTRAVENOUS

## 2014-04-04 MED ORDER — BUPIVACAINE HCL (PF) 0.25 % IJ SOLN
INTRAMUSCULAR | Status: AC
  Filled 2014-04-04: qty 30

## 2014-04-04 MED ORDER — PROPOFOL 10 MG/ML IV BOLUS
INTRAVENOUS | Status: AC
  Filled 2014-04-04: qty 20

## 2014-04-04 MED ORDER — ONDANSETRON HCL 4 MG/2ML IJ SOLN
4.0000 mg | Freq: Four times a day (QID) | INTRAMUSCULAR | Status: DC | PRN
  Administered 2014-04-04: 4 mg via INTRAVENOUS
  Filled 2014-04-04: qty 2

## 2014-04-04 MED ORDER — PHENOL 1.4 % MT LIQD: 1.0000 | OROMUCOSAL | Status: DC | PRN

## 2014-04-04 MED ORDER — LIDOCAINE HCL (CARDIAC) 20 MG/ML IV SOLN
INTRAVENOUS | Status: DC | PRN
  Administered 2014-04-04: 80 mg via INTRAVENOUS

## 2014-04-04 MED ORDER — LACTATED RINGERS IV SOLN
INTRAVENOUS | Status: DC | PRN
  Administered 2014-04-04: 09:00:00 via INTRAVENOUS

## 2014-04-04 MED ORDER — MENTHOL 3 MG MT LOZG: 1.0000 | LOZENGE | OROMUCOSAL | Status: DC | PRN

## 2014-04-04 MED ORDER — HYDROCODONE-ACETAMINOPHEN 5-325 MG PO TABS: 1.0000 | ORAL_TABLET | Freq: Four times a day (QID) | ORAL | Status: DC | PRN

## 2014-04-04 MED ORDER — ASPIRIN EC 325 MG PO TBEC
325.0000 mg | DELAYED_RELEASE_TABLET | Freq: Two times a day (BID) | ORAL | Status: DC
  Administered 2014-04-04 – 2014-04-08 (×8): 325 mg via ORAL
  Filled 2014-04-04 (×10): qty 1

## 2014-04-04 MED ORDER — PANTOPRAZOLE SODIUM 40 MG PO TBEC
40.0000 mg | DELAYED_RELEASE_TABLET | Freq: Every day | ORAL | Status: DC
  Administered 2014-04-04 – 2014-04-08 (×5): 40 mg via ORAL
  Filled 2014-04-04 (×4): qty 1

## 2014-04-04 SURGICAL SUPPLY — 48 items
BLADE SURG 10 STRL SS (BLADE) ×3 IMPLANT
BNDG COHESIVE 4X5 TAN STRL (GAUZE/BANDAGES/DRESSINGS) ×3 IMPLANT
BNDG GAUZE ELAST 4 BULKY (GAUZE/BANDAGES/DRESSINGS) ×3 IMPLANT
COVER PERINEAL POST (MISCELLANEOUS) ×3 IMPLANT
COVER SURGICAL LIGHT HANDLE (MISCELLANEOUS) ×3 IMPLANT
DRAPE STERI IOBAN 125X83 (DRAPES) ×3 IMPLANT
DRSG MEPILEX BORDER 4X12 (GAUZE/BANDAGES/DRESSINGS) ×3 IMPLANT
DRSG MEPILEX BORDER 4X4 (GAUZE/BANDAGES/DRESSINGS) ×3 IMPLANT
DRSG MEPILEX BORDER 4X8 (GAUZE/BANDAGES/DRESSINGS) ×3 IMPLANT
DRSG PAD ABDOMINAL 8X10 ST (GAUZE/BANDAGES/DRESSINGS) ×3 IMPLANT
DURAPREP 26ML APPLICATOR (WOUND CARE) ×3 IMPLANT
ELECT CAUTERY BLADE 6.4 (BLADE) ×3 IMPLANT
ELECT REM PT RETURN 9FT ADLT (ELECTROSURGICAL) ×3
ELECTRODE REM PT RTRN 9FT ADLT (ELECTROSURGICAL) ×1 IMPLANT
GLOVE BIO SURGEON STRL SZ7 (GLOVE) ×3 IMPLANT
GLOVE BIO SURGEON STRL SZ8 (GLOVE) ×3 IMPLANT
GLOVE BIOGEL PI IND STRL 7.0 (GLOVE) ×1 IMPLANT
GLOVE BIOGEL PI IND STRL 8 (GLOVE) ×2 IMPLANT
GLOVE BIOGEL PI INDICATOR 7.0 (GLOVE) ×2
GLOVE BIOGEL PI INDICATOR 8 (GLOVE) ×4
GLOVE ORTHO TXT STRL SZ7.5 (GLOVE) ×3 IMPLANT
GOWN STRL REUS W/ TWL LRG LVL3 (GOWN DISPOSABLE) ×3 IMPLANT
GOWN STRL REUS W/ TWL XL LVL3 (GOWN DISPOSABLE) ×4 IMPLANT
GOWN STRL REUS W/TWL LRG LVL3 (GOWN DISPOSABLE) ×6
GOWN STRL REUS W/TWL XL LVL3 (GOWN DISPOSABLE) ×8
GUIDE PIN 3.2MM (MISCELLANEOUS) ×2
GUIDE PIN ORTH 343X3.2XBRAD (MISCELLANEOUS) ×1 IMPLANT
HIP SCREW SET (Screw) ×3 IMPLANT
KIT BASIN OR (CUSTOM PROCEDURE TRAY) ×3 IMPLANT
KIT ROOM TURNOVER OR (KITS) ×3 IMPLANT
LINER BOOT UNIVERSAL DISP (MISCELLANEOUS) ×3 IMPLANT
MANIFOLD NEPTUNE II (INSTRUMENTS) ×3 IMPLANT
NAIL LEFT 130 IMHS 12X30 (Nail) ×3 IMPLANT
NEEDLE HYPO 25GX1X1/2 BEV (NEEDLE) ×3 IMPLANT
NS IRRIG 1000ML POUR BTL (IV SOLUTION) ×3 IMPLANT
PACK GENERAL/GYN (CUSTOM PROCEDURE TRAY) ×3 IMPLANT
PAD ARMBOARD 7.5X6 YLW CONV (MISCELLANEOUS) ×6 IMPLANT
SCREW COMPRESSION (Screw) ×3 IMPLANT
SCREW LAG 110MM (Screw) ×3 IMPLANT
STAPLER VISISTAT 35W (STAPLE) ×3 IMPLANT
SUT VIC AB 0 CT1 27 (SUTURE) ×4
SUT VIC AB 0 CT1 27XBRD ANBCTR (SUTURE) ×2 IMPLANT
SUT VIC AB 2-0 CT1 27 (SUTURE) ×2
SUT VIC AB 2-0 CT1 TAPERPNT 27 (SUTURE) ×1 IMPLANT
SYR CONTROL 10ML LL (SYRINGE) ×3 IMPLANT
TOWEL OR 17X24 6PK STRL BLUE (TOWEL DISPOSABLE) ×3 IMPLANT
TOWEL OR 17X26 10 PK STRL BLUE (TOWEL DISPOSABLE) ×3 IMPLANT
WATER STERILE IRR 1000ML POUR (IV SOLUTION) ×3 IMPLANT

## 2014-04-04 NOTE — Anesthesia Preprocedure Evaluation (Addendum)
Anesthesia Evaluation  Patient identified by MRN, date of birth, ID band Patient awake    Reviewed: Allergy & Precautions, NPO status , Patient's Chart, lab work & pertinent test results, reviewed documented beta blocker date and time   Airway Mallampati: III  TM Distance: >3 FB Neck ROM: Full    Dental  (+) Teeth Intact, Dental Advisory Given   Pulmonary shortness of breath and with exertion, sleep apnea ,  breath sounds clear to auscultation        Cardiovascular hypertension, Pt. on medications and Pt. on home beta blockers + angina + CAD and + CABG + dysrhythmias Rhythm:Regular Rate:Normal     Neuro/Psych  Neuromuscular disease    GI/Hepatic Neg liver ROS, hiatal hernia, GERD-  Medicated and Controlled,  Endo/Other  diabetes, Well Controlled  Renal/GU negative Renal ROS     Musculoskeletal   Abdominal   Peds  Hematology   Anesthesia Other Findings   Reproductive/Obstetrics                            Anesthesia Physical Anesthesia Plan  ASA: III  Anesthesia Plan: General   Post-op Pain Management:    Induction: Intravenous  Airway Management Planned: Oral ETT  Additional Equipment:   Intra-op Plan:   Post-operative Plan: Possible Post-op intubation/ventilation  Informed Consent: I have reviewed the patients History and Physical, chart, labs and discussed the procedure including the risks, benefits and alternatives for the proposed anesthesia with the patient or authorized representative who has indicated his/her understanding and acceptance.     Plan Discussed with: CRNA and Anesthesiologist  Anesthesia Plan Comments:         Anesthesia Quick Evaluation

## 2014-04-04 NOTE — Anesthesia Postprocedure Evaluation (Signed)
  Anesthesia Post-op Note  Patient: Lucas Morales  Procedure(s) Performed: Procedure(s): COMPRESSION HIP LEFT (Left)  Patient Location: PACU  Anesthesia Type:General  Level of Consciousness: awake  Airway and Oxygen Therapy: Patient Spontanous Breathing  Post-op Pain: mild  Post-op Assessment: Post-op Vital signs reviewed  Post-op Vital Signs: Reviewed  Last Vitals:  Filed Vitals:   04/04/14 1147  BP: 106/59  Pulse: 66  Temp:   Resp: 15    Complications: No apparent anesthesia complications

## 2014-04-04 NOTE — Progress Notes (Signed)
PROGRESS NOTE  Charan WILLIAMSEN BBC:[id number] DOB: Dec 21, 1933 DOA: 04/03/2014 PCP: Horatio Pel, MD  HPI: Lucas Morales is a 81 y.o. male with PMHx significant for hyperlipidemia, hypertension, CAD, arthritis, presenting to the emergency department for evaluation of left-sided hip pain after a mechanical fall. Patient reports that was walking into a building when he slipped, causing him to fall at about 7:00 PM. He started have pain over left hip. He denies hitting his head or loss of consciousness. He states he unable to ambulate. Patient denies fever, chills, fatigue, headaches, cough, chest pain, SOB, abdominal pain, diarrhea, constipation, dysuria, urgency, frequency, hematuria, skin rashes and leg swelling. Work up in the ED demonstrates Proximal left femur fracture by x-ray. Patient is admitted to inpatient for further evaluation and treatment. Orthopedic surgeon was consulted by ED.  Subjective / 24 H Interval events No complaints, seen after the surgery, he still feels "foggy"  Assessment/Plan: Principal Problem:   Fracture, femur Active Problems:   ESOPHAGEAL STRICTURE   GERD   Hypertension   Hyperlipidemia   CAD -- mLAD 80&-60%, OM2 90%, OM3 70-80%, RPDA 90%, cath 01/11/12   S/P CABG x 4, elective, 01/18/12   OSA (obstructive sleep apnea)   Closed left hip fracture   Femur fracture    Left hip with displaced intertrochanteric femur fracture, - orthopedic surgery consulted, recent underwent hip repair on 1/16, doing well postop. - PT to work with him tomorrow  Coronary artery disease - status post CABG, no chest pain, stable for now, continue home medications  Diabetes mellitus - sliding scale insulin  Hypertension - continue Coreg  Hyperlipidemia - continue statin    Diet: Diet Carb Modified Fluids: NS DVT Prophylaxis: per ortho  Code Status: Full Code Family Communication: d/w family bedside  Disposition Plan: remain  inpatient  Consultants:  Orthopedic surgery   Procedures:  Repair left hip fx 1/16   Antibiotics  Anti-infectives    Start     Dose/Rate Route Frequency Ordered Stop   04/04/14 1230  ceFAZolin (ANCEF) IVPB 2 g/50 mL premix     2 g100 mL/hr over 30 Minutes Intravenous Every 6 hours 04/04/14 1217 04/05/14 0029   04/04/14 1000  ceFAZolin (ANCEF) IVPB 2 g/50 mL premix     2 g100 mL/hr over 30 Minutes Intravenous  Once 04/04/14 0904     04/04/14 0911  ceFAZolin (ANCEF) 2-3 GM-% IVPB SOLR    Comments:  Ancil Boozer  : cabinet override      04/04/14 0911 04/04/14 0947       Studies  Dg Pelvis Portable  04/03/2014   CLINICAL DATA:  Initial encounter for fall today with injured left hip. extreme pain.  EXAM: PORTABLE PELVIS 1-2 VIEWS  COMPARISON:  04/30/2009 CTA  FINDINGS: Comminuted intratrochanteric left femur fracture with varus angulation. No dislocation. Remainder of pelvis intact.  IMPRESSION: Proximal left femur fracture.   Electronically Signed   By: Abigail Miyamoto M.D.   On: 04/03/2014 20:59   Dg Chest Port 1 View  04/03/2014   CLINICAL DATA:  Status post fall; concern for chest injury. Initial encounter.  EXAM: PORTABLE CHEST - 1 VIEW  COMPARISON:  Chest radiograph from 10/21/2012  FINDINGS: The lungs are well-aerated. Minimal bibasilar atelectasis is noted. There is no evidence of pleural effusion or pneumothorax.  The cardiomediastinal silhouette is borderline normal in size. The patient is status post median sternotomy, with evidence of prior CABG. No acute osseous abnormalities are seen. A moderate hiatal hernia  is noted.  Apparent cervical spinal fusion hardware is noted. This is somewhat unusual in appearance, likely reflecting patient rotation; would correlate with the patient's history to ensure that this reflects cervical spinal hardware.  IMPRESSION: 1. Minimal bibasilar atelectasis noted. Lungs otherwise grossly clear. 2. No displaced rib fracture seen. 3. Moderate  hiatal hernia noted. 4. Apparent cervical spinal fusion hardware noted. This is somewhat unusual in appearance, likely reflecting patient rotation. Would correlate with the patient's history to ensure that this reflects cervical spinal hardware.   Electronically Signed   By: Garald Balding M.D.   On: 04/03/2014 21:52   Dg C-arm 1-60 Min  04/04/2014   CLINICAL DATA:  Intertrochanteric left hip fracture. Subsequent encounter.  EXAM: DG C-ARM 61-120 MIN; DG FEMUR 2+V*L*  : COMPARISON:  04/03/2014  FINDINGS: Multiple fluoroscopic spot images show placement of a compression screw and intramedullary rod transfixing the intertrochanteric left hip fracture in near anatomic alignment. Total left knee prosthesis also noted.  IMPRESSION: Internal fixation of intertrochanteric left hip fracture in near anatomic alignment.   Electronically Signed   By: Earle Gell M.D.   On: 04/04/2014 11:19   Dg Femur Min 2 Views Left  04/04/2014   CLINICAL DATA:  Intertrochanteric left hip fracture. Subsequent encounter.  EXAM: DG C-ARM 61-120 MIN; DG FEMUR 2+V*L*  : COMPARISON:  04/03/2014  FINDINGS: Multiple fluoroscopic spot images show placement of a compression screw and intramedullary rod transfixing the intertrochanteric left hip fracture in near anatomic alignment. Total left knee prosthesis also noted.  IMPRESSION: Internal fixation of intertrochanteric left hip fracture in near anatomic alignment.   Electronically Signed   By: Earle Gell M.D.   On: 04/04/2014 11:19    Objective  Filed Vitals:   04/04/14 1130 04/04/14 1147 04/04/14 1220 04/04/14 1404  BP: 105/63 106/59 105/56 116/61  Pulse: 61 66 67 58  Temp: 97.8 F (36.6 C)  97.8 F (36.6 C) 98.1 F (36.7 C)  TempSrc:    Oral  Resp: 16 15 16 18   Height:      Weight:      SpO2: 96% 97% 92% 98%    Intake/Output Summary (Last 24 hours) at 04/04/14 1657 Last data filed at 04/04/14 1056  Gross per 24 hour  Intake   1750 ml  Output   1150 ml  Net    600 ml    Filed Weights   04/03/14 2024  Weight: 107.502 kg (237 lb)    Exam:  General:  NAD  HEENT: no scleral icterus  Cardiovascular: RRR  Respiratory: CTA biL  Abdomen: soft, non tender  Skin: no rashes  Neuro: non focal  Data Reviewed: Basic Metabolic Panel:  Recent Labs Lab 04/03/14 2135 04/04/14 0502  NA 138 137  K 3.9 4.0  CL 107 105  CO2 23 23  GLUCOSE 168* 133*  BUN 21 20  CREATININE 0.95 0.96  CALCIUM 9.4 9.0   CBC:  Recent Labs Lab 04/03/14 2135 04/04/14 0502  WBC 7.6 6.1  NEUTROABS 5.7  --   HGB 13.2 12.7*  HCT 37.0* 36.7*  MCV 93.0 95.1  PLT 136* 148*   CBG:  Recent Labs Lab 04/04/14 0640 04/04/14 0847 04/04/14 1113  GLUCAP 146* 136* 141*    Recent Results (from the past 240 hour(s))  MRSA PCR Screening     Status: None   Collection Time: 04/04/14  1:40 AM  Result Value Ref Range Status   MRSA by PCR NEGATIVE NEGATIVE Final  Comment:        The GeneXpert MRSA Assay (FDA approved for NASAL specimens only), is one component of a comprehensive MRSA colonization surveillance program. It is not intended to diagnose MRSA infection nor to guide or monitor treatment for MRSA infections.      Scheduled Meds: . aspirin EC  325 mg Oral BID PC  . atorvastatin  40 mg Oral q1800  . calcium carbonate  3-4 tablet Oral Daily  . carvedilol  6.25 mg Oral BID  .  ceFAZolin (ANCEF) IV  2 g Intravenous Once  .  ceFAZolin (ANCEF) IV  2 g Intravenous Q6H  . fentaNYL      . insulin aspart  0-9 Units Subcutaneous TID WC  . loratadine  10 mg Oral Daily  . multivitamin with minerals  1 tablet Oral Daily  . pantoprazole  40 mg Oral Daily  . polycarbophil  625 mg Oral Daily   Continuous Infusions: . sodium chloride 100 mL/hr at 04/04/14 0242  . sodium chloride    . lactated ringers 10 mL/hr at 04/04/14 0915    Marzetta Board, MD [facility] Pager [phone]. If 7 PM - 7 AM, please contact night-coverage at [email or web address], password  [facility] 04/04/2014, 4:57 PM  LOS: 1 day

## 2014-04-04 NOTE — Anesthesia Procedure Notes (Signed)
Procedure Name: Intubation Date/Time: 04/04/2014 9:44 AM Performed by: Marinda Elk A Pre-anesthesia Checklist: Patient identified, Timeout performed, Emergency Drugs available, Suction available and Patient being monitored Patient Re-evaluated:Patient Re-evaluated prior to inductionOxygen Delivery Method: Circle system utilized Preoxygenation: Pre-oxygenation with 100% oxygen Intubation Type: IV induction Tube type: Oral Tube size: 7.5 mm Number of attempts: 1 Airway Equipment and Method: Video-laryngoscopy and Stylet Placement Confirmation: ETT inserted through vocal cords under direct vision,  breath sounds checked- equal and bilateral and positive ETCO2 Secured at: 23 cm Tube secured with: Tape Dental Injury: Teeth and Oropharynx as per pre-operative assessment  Difficulty Due To: Difficulty was anticipated

## 2014-04-04 NOTE — Brief Op Note (Signed)
04/03/2014 - 04/04/2014  10:58 AM  PATIENT:  Lucas Morales  81 y.o. male  PRE-OPERATIVE DIAGNOSIS:  left intertrochanteric fracture  POST-OPERATIVE DIAGNOSIS:  left intertrochanteric fracture  PROCEDURE:  Procedure(s): COMPRESSION HIP LEFT (Left)  SURGEON:  Surgeon(s) and Role:    * Mcarthur Rossetti, MD - Primary  ANESTHESIA:   general  EBL:  Total I/O In: 1750 [I.V.:1500; IV Piggyback:250] Out: 475 [Urine:225; Blood:250]  BLOOD ADMINISTERED:none  DRAINS: none   LOCAL MEDICATIONS USED:  NONE  SPECIMEN:  No Specimen  DISPOSITION OF SPECIMEN:  N/A  COUNTS:  YES  TOURNIQUET:  * No tourniquets in log *  DICTATION: .Other Dictation: Dictation Number [id number]  PLAN OF CARE: Admit to inpatient   PATIENT DISPOSITION:  PACU - hemodynamically stable.   Delay start of Pharmacological VTE agent (>24hrs) due to surgical blood loss or risk of bleeding: no

## 2014-04-04 NOTE — Progress Notes (Signed)
OT Cancellation Note  Patient Details Name: Lucas Morales MRN: [id number] DOB: [birth year]   Cancelled Treatment:    Reason Eval/Treat Not Completed: Patient at procedure or test/ unavailable. Pt in OR for surgery. OT will evaluate as available.    Villa Herb M   Cyndie Chime, OTR/L Occupational Therapist [phone] (pager)  04/04/2014, 11:00 AM

## 2014-04-04 NOTE — Progress Notes (Signed)
PT Cancellation Note  Patient Details Name: GLENFORD GARIS MRN: [id number] DOB: [birth year]   Cancelled Treatment:    Reason Eval/Treat Not Completed: Patient not medically ready;Patient at procedure or test/unavailable. Pt to OR for surgery. Will see at next available time.    Elie Confer Steger, [place] 04/04/2014, 8:42 AM

## 2014-04-04 NOTE — Progress Notes (Signed)
Patient ID: Lucas Morales, male   DOB: [birth year], 81 y.o.   MRN: [id number] [name] understands fully that we are proceeding to the OR today for surgical repair/fixation of his left hip intertroch fracture.  He understands the risks and benefits involved and informed consent is obtained.

## 2014-04-04 NOTE — Op Note (Signed)
NAME:  Lucas Morales, Lucas Morales NO.:  [id number]  MEDICAL RECORD NO.:  [id number]  LOCATION:  5N21C                        FACILITY:  [facility]  PHYSICIAN:  Lind Guest. Ninfa Linden, M.D.DATE OF BIRTH:  1934-03-16  DATE OF PROCEDURE:  04/04/2014 DATE OF DISCHARGE:                              OPERATIVE REPORT   PREOPERATIVE DIAGNOSIS:  Left displaced intertrochanteric femur/hip fracture.  POSTOPERATIVE DIAGNOSIS:  Left displaced intertrochanteric femur/hip fracture.  PROCEDURE:  Open reduction and internal fixation of left intertrochanteric femur fracture using intramedullary hip screw.  IMPLANTS:  [employer] 12 x 40 left intramedullary nail with 110 mm lag screw and compression component.  SURGEON:  Lind Guest. Ninfa Linden, M.D.  ANESTHESIA:  General.  ANTIBIOTICS:  2 g IV Ancef.  BLOOD LOSS:  250 mL.  COMPLICATIONS:  None.  INDICATIONS:  Lucas Morales is a very pleasant 81 year old gentleman is very active, he slipped on wet concrete last evening and landing directly on his left hip.  He was seen in emergency room and found to have a significant displaced intertrochanteric femur fracture/hip fracture involving the left proximal femur.  He was admitted graciously to the Medicine Service.  He is a diabetic and has had some heart issues in the past, but all these are stable right now.  He has had bilateral knee replacements by 1 of my partners.  We recommended an open reduction and internal fixation of this hip fracture given the nature of the fracture and he does wish to proceed with surgery and understands the risks and benefits of this.  PROCEDURE DESCRIPTION:  After informed consent was obtained, appropriate left hip was marked.  He was brought to the operating room and general anesthesia was obtained while he was on the stretcher.  A Foley catheter was placed and then he was placed supine on the fracture table with the perineal post in place.  The  left leg in inline skeletal traction device and the right hip flexed and abducted with appropriate padding in the stirrup.  We then assessed his left hip fluoroscopically, and with traction and internal rotation and adduction, we were able to reduce his fracture adequately.  We then chose his femoral nail based on keeping in the sterile box lying on top of the leg under direct fluoroscopy, so we can get an idea of our length of the rod.  We then chose a 12 x 40 rod. We then prepped his left hip with DuraPrep and sterile drapes including a sterile shower curtain drape.  A time-out was called and he was identified as the correct patient and correct left hip.  We then made an incision proximal to the greater trochanter on the lateral aspect of the thigh and dissected down the tip of the greater trochanter.  Under direct fluoroscopy, I was then able to place a temporary guide pin for initiating reaming.  We opened up the femoral canal easily.  We then removed the guide pin and placed the rod without needing to ream in an antegrade fashion down the femoral canal and verified its placement radiographically as well.  Using the outrigger guide, I made a separate incision in the thigh a little bit more distal and  lateral and I was able to place a temporary guide pin traversing the lateral cortex of the femur traversing the fracture into good position in the femoral head. We then took a measurement of this and chose a 110 mm lag screw.  We drilled to this depth and then placed the lag screw without complications.  We let traction off the bed and then placed the compression component to compress the fracture.  I was pleased with this as well and then removed the outrigger guide and all instrumentation. We irrigated the 2 small wounds with normal saline solution, closed the deep tissue with 0 Vicryl followed by 2-0 Vicryl in subcutaneous tissue and staples on the skin.  Well-padded dressing was  applied on both incisions.  He was then awakened, extubated, and taken to recovery room in stable condition.  All final counts were correct and no complications noted.  Postoperatively, he will be 50% weightbearing, increasing his mobility as comfort allows, getting up with physical therapy and occupational therapy.     Lind Guest. Ninfa Linden, M.D.     CYB/MEDQ  D:  04/04/2014  T:  04/04/2014  Job:  [id number]

## 2014-04-04 NOTE — Transfer of Care (Signed)
Immediate Anesthesia Transfer of Care Note  Patient: Lucas Morales  Procedure(s) Performed: Procedure(s): COMPRESSION HIP LEFT (Left)  Patient Location: PACU  Anesthesia Type:General  Level of Consciousness: awake  Airway & Oxygen Therapy: Patient Spontanous Breathing and Patient connected to nasal cannula oxygen  Post-op Assessment: Report given to PACU RN and Post -op Vital signs reviewed and stable  Post vital signs: Reviewed and stable  Complications: No apparent anesthesia complications

## 2014-04-05 DIAGNOSIS — D62 Acute posthemorrhagic anemia: Secondary | ICD-10-CM | POA: Insufficient documentation

## 2014-04-05 DIAGNOSIS — D696 Thrombocytopenia, unspecified: Secondary | ICD-10-CM

## 2014-04-05 DIAGNOSIS — E139 Other specified diabetes mellitus without complications: Secondary | ICD-10-CM

## 2014-04-05 LAB — CBC
HCT: 29.1 % — ABNORMAL LOW (ref 39.0–52.0)
Hemoglobin: 10.3 g/dL — ABNORMAL LOW (ref 13.0–17.0)
MCH: 33.9 pg (ref 26.0–34.0)
MCHC: 35.4 g/dL (ref 30.0–36.0)
MCV: 95.7 fL (ref 78.0–100.0)
PLATELETS: 111 10*3/uL — AB (ref 150–400)
RBC: 3.04 MIL/uL — ABNORMAL LOW (ref 4.22–5.81)
RDW: 14.9 % (ref 11.5–15.5)
WBC: 4.8 10*3/uL (ref 4.0–10.5)

## 2014-04-05 LAB — GLUCOSE, CAPILLARY
Glucose-Capillary: 141 mg/dL — ABNORMAL HIGH (ref 70–99)
Glucose-Capillary: 138 mg/dL — ABNORMAL HIGH (ref 70–99)
Glucose-Capillary: 160 mg/dL — ABNORMAL HIGH (ref 70–99)
Glucose-Capillary: 118 mg/dL — ABNORMAL HIGH (ref 70–99)

## 2014-04-05 LAB — BASIC METABOLIC PANEL
ANION GAP: 8 (ref 5–15)
BUN: 15 mg/dL (ref 6–23)
CALCIUM: 8.4 mg/dL (ref 8.4–10.5)
CHLORIDE: 108 meq/L (ref 96–112)
CO2: 22 mmol/L (ref 19–32)
CREATININE: 0.88 mg/dL (ref 0.50–1.35)
GFR, EST NON AFRICAN AMERICAN: 80 mL/min — AB (ref >90)
GLUCOSE: 147 mg/dL — AB (ref 70–99)
Potassium: 3.8 mmol/L (ref 3.5–5.1)
SODIUM: 138 mmol/L (ref 135–145)

## 2014-04-05 NOTE — Evaluation (Signed)
Physical Therapy Evaluation Patient Details Name: Lucas Morales MRN: [id number] DOB: [birth year] Today's Date: 04/05/2014   History of Present Illness  Admitted post fall with hip fx; now s/p ORIF (Compression Screw); 50%PWB  Past Medical History  Diagnosis Date  . Adenomatous colon polyp   . Hemorrhoids   . Diverticulosis   . Gastritis   . IC (interstitial cystitis)   . Esophageal stricture   . Diverticulitis   . Hyperlipidemia     doesn't require meds  . Anginal pain 01/11/2012  . Bronchitis     "I've had it a few times" (01/11/2012)  . Pre-diabetes     "check my sugars q 3 days" (01/11/2012)  . Gout   . Dysrhythmia     bradycardia in the 30's (01/11/2012)  . Coronary artery disease   . Hypertension     takes Losartan daily  . Diabetes mellitus without complication     "borderline"  . History of bronchitis     last time about 3-32yrs ago  . Joint pain   . Joint swelling   . Gout     takes ALlopurinol daily  . GERD (gastroesophageal reflux disease)     takes Pantoprazole daily  . History of colon polyps   . Urinary frequency   . Interstitial cystitis   . Depression     does't require any meds  . Nocturia   . Insomnia     takes Nortrypyline nightly  . Shortness of breath 01/11/2012    pt states all of the time drSOOD DID PFT RECENTLY  . Hiatal hernia   . Esophageal dysmotility   . Arthritis     "knees" (01/11/2012)  . Paroxysmal atrial fibrillation   . Hyperlipidemia   . Dyspnea on exertion   . OSA (obstructive sleep apnea) 10/21/2012   Past Surgical History  Procedure Laterality Date  . Reconstruction medial collateral ligament elbow w/ tendon graft  1988 X 2    right  . Total knee arthroplasty  ~ 2001; 2011    right; left  . Bladder lesion removed    . Exploratory laparotomy  2009    with right colectomy  . Tonsillectomy and adenoidectomy  ~1943  . Appendectomy  2009  . Cholecystectomy  2009  . Lumbar disc surgery  ?2010    "cleaned out the  stenosis" (01/11/2012)  . Cystoscopy with ureteroscopy  2008; 2009    "painted inside of bladder wall"  . Cataract extraction w/ intraocular lens  implant, bilateral  2011  . Tumor excision  12/2011    left arm  . Elbow surgery  1988    right  . Colectomy      d/t diverticulosis  . Heel spurs removed  25+yrs ago    bil   . Colonoscopy    . Esophagogastroduodenoscopy    . Coronary artery bypass graft  01/18/2012    Procedure: CORONARY ARTERY BYPASS GRAFTING (CABG);  Surgeon: Ivin Poot, MD;  Location: [place];  Service: Open Heart Surgery;  Laterality: N/A;  times four, using left internal mammary artery  . Endovein harvest of greater saphenous vein  01/18/2012    Procedure: ENDOVEIN HARVEST OF GREATER SAPHENOUS VEIN;  Surgeon: Ivin Poot, MD;  Location: [place];  Service: Open Heart Surgery;  Laterality: Right;  . Cardiac catheterization  01/11/2012  . Joint replacement  90's and 2000's    bil knee  . Pars plana vitrectomy w/ repair of macular hole Right 10/08/2012  Dr Zigmund Daniel  . 25 gauge pars plana vitrectomy with 20 gauge mvr port for macular hole Right 10/08/2012    Procedure: 25 GAUGE PARS PLANA VITRECTOMY WITH 20 GAUGE MVR PORT FOR MACULAR HOLE; Membrame peel, serum patch; Laser treatment ; Gas exchange;  Surgeon: Hayden Pedro, MD;  Location: [place];  Service: Ophthalmology;  Laterality: Right;  . Left heart catheterization with coronary angiogram N/A 01/11/2012    Procedure: LEFT HEART CATHETERIZATION WITH CORONARY ANGIOGRAM;  Surgeon: Leonie Man, MD;  Location: [facility] CATH LAB;  Service: Cardiovascular;  Laterality: N/A;     Clinical Impression  Patient is s/p above surgery resulting in functional limitations due to the deficits listed below (see PT Problem List).  Patient will benefit from skilled PT to increase their independence and safety with mobility to allow discharge to the venue listed below.       Follow Up Recommendations SNF    Equipment  Recommendations  Rolling walker with 5" wheels;3in1 (PT)    Recommendations for Other Services OT consult     Precautions / Restrictions Precautions Precautions: Fall Restrictions LLE Weight Bearing: Partial weight bearing LLE Partial Weight Bearing Percentage or Pounds: 50%      Mobility  Bed Mobility Overal bed mobility: Needs Assistance Bed Mobility: Supine to Sit     Supine to sit: Mod assist     General bed mobility comments: Cues for technique; good half-bridge with RLE to move hips to EOB; Heavy mod assist to pull to fully upright sitting  Transfers Overall transfer level: Needs assistance Equipment used: Rolling walker (2 wheeled) Transfers: Sit to/from Stand Sit to Stand: +2 physical assistance;Mod assist         General transfer comment: Cues for technique, safety, and hand placement; Heavy mod assist for lift and to acheive fully upright standing  Ambulation/Gait Ambulation/Gait assistance: +2 safety/equipment;Mod assist Ambulation Distance (Feet): 8 Feet Assistive device: Rolling walker (2 wheeled) Gait Pattern/deviations: Step-to pattern;Decreased step length - right;Decreased step length - left     General Gait Details: verbal and demo cues for maintaining 50%PWB LLE; Difficulty advancing LLE and difficulty pushing down into RW to keep 50% weight while advancing RLE; Pain also limiting factor, and ultimately had to stop walking due to pain  Stairs            Wheelchair Mobility    Modified Rankin (Stroke Patients Only)       Balance Overall balance assessment: Needs assistance   Sitting balance-Leahy Scale: Fair       Standing balance-Leahy Scale: Poor                               Pertinent Vitals/Pain Pain Assessment: 0-10 Pain Score: 8  Pain Location: L hip, especially with ambulating Pain Descriptors / Indicators: Aching;Grimacing;Guarding Pain Intervention(s): Limited activity within patient's  tolerance;Monitored during session;Repositioned    Home Living Family/patient expects to be discharged to:: Private residence Living Arrangements: Spouse/significant other Available Help at Discharge: Family;Available 24 hours/day Type of Home: House Home Access: Stairs to enter Entrance Stairs-Rails: None Entrance Stairs-Number of Steps: 3 Home Layout: Two level;Able to live on main level with bedroom/bathroom Home Equipment: Gilford Rile - 2 wheels      Prior Function Level of Independence: Independent               Hand Dominance        Extremity/Trunk Assessment   Upper Extremity Assessment: Generalized  weakness (Noted decr ability to support body weight with UEs on RW)           Lower Extremity Assessment: LLE deficits/detail   LLE Deficits / Details: Grossly decr AROM and strength, limited by pain postop     Communication   Communication: No difficulties  Cognition Arousal/Alertness: Awake/alert Behavior During Therapy: WFL for tasks assessed/performed Overall Cognitive Status: Within Functional Limits for tasks assessed                      General Comments      Exercises        Assessment/Plan    PT Assessment Patient needs continued PT services  PT Diagnosis Difficulty walking;Acute pain;Generalized weakness   PT Problem List Decreased strength;Decreased range of motion;Decreased activity tolerance;Decreased balance;Decreased mobility;Decreased coordination;Decreased knowledge of use of DME;Pain;Decreased knowledge of precautions  PT Treatment Interventions DME instruction;Gait training;Stair training;Functional mobility training;Therapeutic activities;Therapeutic exercise;Balance training;Patient/family education   PT Goals (Current goals can be found in the Care Plan section) Acute Rehab PT Goals Patient Stated Goal: Wants to be able to be independent PT Goal Formulation: With patient Time For Goal Achievement: 04/19/14 Potential to  Achieve Goals: Good    Frequency Min 5X/week   Barriers to discharge        Co-evaluation               End of Session Equipment Utilized During Treatment: Gait belt Activity Tolerance: Patient limited by pain Patient left: in bed;with call bell/phone within reach;with family/visitor present Nurse Communication: Mobility status         Time: 1224-4975 PT Time Calculation (min) (ACUTE ONLY): 33 min   Charges:   PT Evaluation $Initial PT Evaluation Tier I: 1 Procedure PT Treatments $Gait Training: 8-22 mins $Therapeutic Activity: 8-22 mins   PT G Codes:        Quin Hoop 04/05/2014, 5:22 PM  Roney Marion, [place] Pager [phone] Office [phone]

## 2014-04-05 NOTE — Progress Notes (Signed)
   Subjective:  Patient reports pain as moderate.  No events.  Objective:   VITALS:   Filed Vitals:   04/05/14 0000 04/05/14 0112 04/05/14 0400 04/05/14 0541  BP:  117/57  117/55  Pulse:  76  77  Temp:  99.1 F (37.3 C)  98.2 F (36.8 C)  TempSrc:  Oral  Oral  Resp: 16 16 16 16   Height:      Weight:      SpO2: 93% 93% 95% 91%    Neurologically intact Neurovascular intact Sensation intact distally Intact pulses distally Dorsiflexion/Plantar flexion intact Incision: dressing C/D/I and no drainage No cellulitis present Compartment soft   Lab Results  Component Value Date   WBC 4.8 04/05/2014   HGB 10.3* 04/05/2014   HCT 29.1* 04/05/2014   MCV 95.7 04/05/2014   PLT 111* 04/05/2014     Assessment/Plan:  1 Day Post-Op   - Expected postop acute blood loss anemia - will monitor for symptoms - Up with PT/OT - DVT ppx - SCDs, ambulation, asa - WBAT left lower extremity - Pain control - Discharge planning  Marianna Payment 04/05/2014, 10:13 AM [phone]

## 2014-04-05 NOTE — Progress Notes (Addendum)
Role of Case Manager explained.Patient reports his understanding.Patient provided with Choice list.Elects Advanced home care if Home health is ordered at time of discharge.CM contacted [facility] and spoke with Chrisin [facility] she will  Follow patient for any Home health needs.

## 2014-04-05 NOTE — Plan of Care (Signed)
Problem: Consults Goal: Hip/Femur Fracture Patient Education See Patient Education Module for education specifics.  Outcome: Completed/Met Date Met:  04/05/14 Left hip compression

## 2014-04-05 NOTE — Progress Notes (Signed)
PROGRESS NOTE  Lucas Morales:[id number] DOB: 01-06-1934 DOA: 04/03/2014 PCP: Horatio Pel, MD  HPI: Lucas Morales is a 81 y.o. male with PMHx significant for hyperlipidemia, hypertension, CAD, arthritis, presenting to the emergency department for evaluation of left-sided hip pain after a mechanical fall. Patient reports that was walking into a building when he slipped, causing him to fall at about 7:00 PM. He started have pain over left hip. He denies hitting his head or loss of consciousness. He states he unable to ambulate. Patient denies fever, chills, fatigue, headaches, cough, chest pain, SOB, abdominal pain, diarrhea, constipation, dysuria, urgency, frequency, hematuria, skin rashes and leg swelling. Work up in the ED demonstrates Proximal left femur fracture by x-ray. Patient is admitted to inpatient for further evaluation and treatment. Orthopedic surgeon was consulted by ED.  Subjective / 24 H Interval events - feeling well this morning, worried about removing his Foley  - anxious to work with PT  Assessment/Plan: Principal Problem:   Fracture, femur Active Problems:   ESOPHAGEAL STRICTURE   GERD   Hypertension   Hyperlipidemia   CAD -- mLAD 80&-60%, OM2 90%, OM3 70-80%, RPDA 90%, cath 01/11/12   S/P CABG x 4, elective, 01/18/12   OSA (obstructive sleep apnea)   Closed left hip fracture   Femur fracture  Left hip with displaced intertrochanteric femur fracture, - orthopedic surgery consulted, recent underwent hip repair on 1/16, doing well postop. - PT eval pending - refused to take out Foley given concerns for making it to the bathroom, not comfortable with using the urinal. Risks explained, patient understands.   Post op ABLA - monitor, no needs for transfusion  Thrombocytopenia - acute on chronic, monitor.  Coronary artery disease - status post CABG, no chest pain, stable for now, continue home medications  Diabetes mellitus - sliding scale  insulin  Hypertension - continue Coreg  Hyperlipidemia - continue statin    Diet: Diet Carb Modified Fluids: NS DVT Prophylaxis: per ortho  Code Status: Full Code Family Communication: d/w family bedside  Disposition Plan: remain inpatient  Consultants:  Orthopedic surgery   Procedures:  Repair left hip fx 1/16   Antibiotics  Anti-infectives    Start     Dose/Rate Route Frequency Ordered Stop   04/04/14 1230  ceFAZolin (ANCEF) IVPB 2 g/50 mL premix     2 g100 mL/hr over 30 Minutes Intravenous Every 6 hours 04/04/14 1217 04/04/14 2120   04/04/14 1000  ceFAZolin (ANCEF) IVPB 2 g/50 mL premix     2 g100 mL/hr over 30 Minutes Intravenous  Once 04/04/14 0904     04/04/14 0911  ceFAZolin (ANCEF) 2-3 GM-% IVPB SOLR    Comments:  Ancil Boozer  : cabinet override      04/04/14 0911 04/04/14 0947       Studies  Dg Pelvis Portable  04/03/2014   CLINICAL DATA:  Initial encounter for fall today with injured left hip. extreme pain.  EXAM: PORTABLE PELVIS 1-2 VIEWS  COMPARISON:  04/30/2009 CTA  FINDINGS: Comminuted intratrochanteric left femur fracture with varus angulation. No dislocation. Remainder of pelvis intact.  IMPRESSION: Proximal left femur fracture.   Electronically Signed   By: Abigail Miyamoto M.D.   On: 04/03/2014 20:59   Dg Chest Port 1 View  04/03/2014   CLINICAL DATA:  Status post fall; concern for chest injury. Initial encounter.  EXAM: PORTABLE CHEST - 1 VIEW  COMPARISON:  Chest radiograph from 10/21/2012  FINDINGS: The lungs are well-aerated. Minimal  bibasilar atelectasis is noted. There is no evidence of pleural effusion or pneumothorax.  The cardiomediastinal silhouette is borderline normal in size. The patient is status post median sternotomy, with evidence of prior CABG. No acute osseous abnormalities are seen. A moderate hiatal hernia is noted.  Apparent cervical spinal fusion hardware is noted. This is somewhat unusual in appearance, likely reflecting  patient rotation; would correlate with the patient's history to ensure that this reflects cervical spinal hardware.  IMPRESSION: 1. Minimal bibasilar atelectasis noted. Lungs otherwise grossly clear. 2. No displaced rib fracture seen. 3. Moderate hiatal hernia noted. 4. Apparent cervical spinal fusion hardware noted. This is somewhat unusual in appearance, likely reflecting patient rotation. Would correlate with the patient's history to ensure that this reflects cervical spinal hardware.   Electronically Signed   By: Garald Balding M.D.   On: 04/03/2014 21:52   Dg C-arm 1-60 Min  04/04/2014   CLINICAL DATA:  Intertrochanteric left hip fracture. Subsequent encounter.  EXAM: DG C-ARM 61-120 MIN; DG FEMUR 2+V*L*  : COMPARISON:  04/03/2014  FINDINGS: Multiple fluoroscopic spot images show placement of a compression screw and intramedullary rod transfixing the intertrochanteric left hip fracture in near anatomic alignment. Total left knee prosthesis also noted.  IMPRESSION: Internal fixation of intertrochanteric left hip fracture in near anatomic alignment.   Electronically Signed   By: Earle Gell M.D.   On: 04/04/2014 11:19   Dg Femur Min 2 Views Left  04/04/2014   CLINICAL DATA:  Intertrochanteric left hip fracture. Subsequent encounter.  EXAM: DG C-ARM 61-120 MIN; DG FEMUR 2+V*L*  : COMPARISON:  04/03/2014  FINDINGS: Multiple fluoroscopic spot images show placement of a compression screw and intramedullary rod transfixing the intertrochanteric left hip fracture in near anatomic alignment. Total left knee prosthesis also noted.  IMPRESSION: Internal fixation of intertrochanteric left hip fracture in near anatomic alignment.   Electronically Signed   By: Earle Gell M.D.   On: 04/04/2014 11:19    Objective  Filed Vitals:   04/05/14 0000 04/05/14 0112 04/05/14 0400 04/05/14 0541  BP:  117/57  117/55  Pulse:  76  77  Temp:  99.1 F (37.3 C)  98.2 F (36.8 C)  TempSrc:  Oral  Oral  Resp: 16 16 16 16     Height:      Weight:      SpO2: 93% 93% 95% 91%    Intake/Output Summary (Last 24 hours) at 04/05/14 0736 Last data filed at 04/05/14 0645  Gross per 24 hour  Intake   2170 ml  Output   1575 ml  Net    595 ml   Filed Weights   04/03/14 2024  Weight: 107.502 kg (237 lb)    Exam:  General:  NAD  HEENT: no scleral icterus  Cardiovascular: RRR  Respiratory: CTA biL  Abdomen: soft, non tender  Skin: no rashes  Neuro: non focal  Data Reviewed: Basic Metabolic Panel:  Recent Labs Lab 04/03/14 2135 04/04/14 0502 04/05/14 0533  NA 138 137 138  K 3.9 4.0 3.8  CL 107 105 108  CO2 23 23 22   GLUCOSE 168* 133* 147*  BUN 21 20 15   CREATININE 0.95 0.96 0.88  CALCIUM 9.4 9.0 8.4   CBC:  Recent Labs Lab 04/03/14 2135 04/04/14 0502 04/05/14 0533  WBC 7.6 6.1 4.8  NEUTROABS 5.7  --   --   HGB 13.2 12.7* 10.3*  HCT 37.0* 36.7* 29.1*  MCV 93.0 95.1 95.7  PLT 136* 148* PENDING  CBG:  Recent Labs Lab 04/04/14 0847 04/04/14 1113 04/04/14 1648 04/04/14 2137 04/05/14 0638  GLUCAP 136* 141* 160* 143* 141*    Recent Results (from the past 240 hour(s))  MRSA PCR Screening     Status: None   Collection Time: 04/04/14  1:40 AM  Result Value Ref Range Status   MRSA by PCR NEGATIVE NEGATIVE Final    Comment:        The GeneXpert MRSA Assay (FDA approved for NASAL specimens only), is one component of a comprehensive MRSA colonization surveillance program. It is not intended to diagnose MRSA infection nor to guide or monitor treatment for MRSA infections.      Scheduled Meds: . aspirin EC  325 mg Oral BID PC  . atorvastatin  40 mg Oral q1800  . calcium carbonate  3-4 tablet Oral Daily  . carvedilol  6.25 mg Oral BID  .  ceFAZolin (ANCEF) IV  2 g Intravenous Once  . insulin aspart  0-9 Units Subcutaneous TID WC  . loratadine  10 mg Oral Daily  . multivitamin with minerals  1 tablet Oral Daily  . pantoprazole  40 mg Oral Daily  . polycarbophil  625  mg Oral Daily   Continuous Infusions: . sodium chloride 100 mL/hr at 04/04/14 0242  . sodium chloride 75 mL/hr at 04/04/14 1809  . lactated ringers 10 mL/hr at 04/04/14 0915    Marzetta Board, MD [facility] Pager [phone]. If 7 PM - 7 AM, please contact night-coverage at [email or web address], password [facility] 04/05/2014, 7:36 AM  LOS: 2 days

## 2014-04-05 NOTE — Progress Notes (Signed)
04/05/2014 6:43 AM  Per order to remove foley POD1. Pt requested that the foley be left in due to pain and not wanting to get up frequently. Pt stated he has interstitial cystitis that makes him use the restroom frequently at night. Pt educated on the use of his foley for peri operative use. Pt explained about being able to use urinal. He refused removal and asked to speak with physician in the morning before removal. Will pass concern over to day shift RN.

## 2014-04-06 LAB — CBC
HCT: 28 % — ABNORMAL LOW (ref 39.0–52.0)
Hemoglobin: 10 g/dL — ABNORMAL LOW (ref 13.0–17.0)
MCH: 34.2 pg — ABNORMAL HIGH (ref 26.0–34.0)
MCHC: 35.7 g/dL (ref 30.0–36.0)
MCV: 95.9 fL (ref 78.0–100.0)
Platelets: 117 10*3/uL — ABNORMAL LOW (ref 150–400)
RBC: 2.92 MIL/uL — ABNORMAL LOW (ref 4.22–5.81)
RDW: 14.9 % (ref 11.5–15.5)
WBC: 5 10*3/uL (ref 4.0–10.5)

## 2014-04-06 LAB — BASIC METABOLIC PANEL
ANION GAP: 4 — AB (ref 5–15)
BUN: 13 mg/dL (ref 6–23)
CO2: 26 mmol/L (ref 19–32)
CREATININE: 0.96 mg/dL (ref 0.50–1.35)
Calcium: 8.7 mg/dL (ref 8.4–10.5)
Chloride: 104 mEq/L (ref 96–112)
GFR calc Af Amer: 90 mL/min — ABNORMAL LOW (ref >90)
GFR, EST NON AFRICAN AMERICAN: 77 mL/min — AB (ref >90)
Glucose, Bld: 143 mg/dL — ABNORMAL HIGH (ref 70–99)
POTASSIUM: 3.7 mmol/L (ref 3.5–5.1)
Sodium: 134 mmol/L — ABNORMAL LOW (ref 135–145)

## 2014-04-06 LAB — GLUCOSE, CAPILLARY
GLUCOSE-CAPILLARY: 141 mg/dL — AB (ref 70–99)
Glucose-Capillary: 137 mg/dL — ABNORMAL HIGH (ref 70–99)
Glucose-Capillary: 144 mg/dL — ABNORMAL HIGH (ref 70–99)

## 2014-04-06 MED ORDER — SENNOSIDES-DOCUSATE SODIUM 8.6-50 MG PO TABS
1.0000 | ORAL_TABLET | Freq: Every evening | ORAL | Status: DC | PRN
  Filled 2014-04-06: qty 1

## 2014-04-06 NOTE — Progress Notes (Signed)
Utilization review completed. Leilani Cespedes, RN, BSN. 

## 2014-04-06 NOTE — Evaluation (Signed)
Occupational Therapy Evaluation Patient Details Name: Lucas Morales MRN: [id number] DOB: 1933-10-17 Today's Date: 04/06/2014    History of Present Illness Admitted post fall with hip fx; now s/p ORIF (Compression Screw); 50%PWB   Clinical Impression   Patient independent PTA. Patient currently functioning at an overall mod>total assist level. Patient will benefit from acute OT to increase overall independence in the areas of ADLs, functional mobility, overall safety, and functional strengthening in order to safely discharge to CIR.     Follow Up Recommendations  CIR;Supervision/Assistance - 24 hour    Equipment Recommendations   (Defer to next venue)    Recommendations for Other Services Rehab consult    Precautions / Restrictions Precautions Precautions: Fall Restrictions Weight Bearing Restrictions: Yes LLE Weight Bearing: Partial weight bearing LLE Partial Weight Bearing Percentage or Pounds: 50%      Mobility Bed Mobility Overal bed mobility: Needs Assistance Bed Mobility: Rolling;Supine to Sit Rolling: Min assist   Supine to sit: Min assist     General bed mobility comments: Patient requires cues for proper technique and physical assistance for LLE  Transfers Overall transfer level: Needs assistance Equipment used: Rolling walker (2 wheeled) Transfers: Sit to/from Stand Sit to Stand: Mod assist         General transfer comment: Patient requires cues for proper technique, safety, and PWB status    Balance Overall balance assessment: Needs assistance Sitting-balance support: Bilateral upper extremity supported Sitting balance-Leahy Scale: Fair     Standing balance support: Bilateral upper extremity supported Standing balance-Leahy Scale: Poor     ADL Overall ADL's : Needs assistance/impaired     Grooming: Sitting;Set up   Upper Body Bathing: Min guard;Sitting (unsupported)   Lower Body Bathing: Sit to/from stand;Total assistance   Upper  Body Dressing : Min guard;Sitting (unsupported)   Lower Body Dressing: Sit to/from stand;Total assistance   Toilet Transfer: Moderate assistance;RW;Ambulation;BSC           Functional mobility during ADLs: Moderate assistance;Rolling walker General ADL Comments: Patient educated on safety with RW, PWB status, and functional mobility/transfers. Patient performed functional ambulation <> BR for toilet transfer onto [facility]. Patient motivated to be as independent as possible.               Pertinent Vitals/Pain Pain Assessment: 0-10 Pain Score: 4  Pain Location: stomach pain Pain Descriptors / Indicators: Sharp     Hand Dominance Right   Extremity/Trunk Assessment Upper Extremity Assessment Upper Extremity Assessment: Generalized weakness (patient will benefit from functional strengthening > BUEs)   Lower Extremity Assessment Lower Extremity Assessment: Defer to PT evaluation   Cervical / Trunk Assessment Cervical / Trunk Assessment: Normal   Communication Communication Communication: No difficulties   Cognition Arousal/Alertness: Awake/alert Behavior During Therapy: WFL for tasks assessed/performed Overall Cognitive Status: Within Functional Limits for tasks assessed             Home Living Family/patient expects to be discharged to:: Private residence Living Arrangements: Spouse/significant other Available Help at Discharge: Family;Available 24 hours/day Type of Home: House Home Access: Stairs to enter [employer] of Steps: 3 Entrance Stairs-Rails: None Home Layout: Two level;Able to live on main level with bedroom/bathroom (Patient reports that he would like to go downstairs for use of TV and office) Alternate Level Stairs-Number of Steps: flight  Alternate Level Stairs-Rails: Right Bathroom Shower/Tub: Walk-in shower;Door   Bathroom Toilet: Handicapped height     Home Equipment: Environmental consultant - 2 wheels  Prior Functioning/Environment Level  of Independence: Independent             OT Diagnosis: Generalized weakness;Acute pain   OT Problem List: Decreased strength;Decreased range of motion;Decreased activity tolerance;Impaired balance (sitting and/or standing);Decreased coordination;Decreased safety awareness;Decreased knowledge of use of DME or AE;Decreased knowledge of precautions;Pain   OT Treatment/Interventions: Self-care/ADL training;Therapeutic exercise;Neuromuscular education;Energy conservation;DME and/or AE instruction;Therapeutic activities;Patient/family education;Balance training    OT Goals(Current goals can be found in the care plan section) Acute Rehab OT Goals Patient Stated Goal: Wants to be able to be independent ADL Goals Pt Will Perform Lower Body Bathing: with min assist;sit to/from stand;with adaptive equipment Pt Will Perform Lower Body Dressing: with min assist;with adaptive equipment;sit to/from stand Pt Will Transfer to Toilet: with min assist;ambulating Pt Will Perform Toileting - Clothing Manipulation and hygiene: with supervision;sit to/from stand;with min guard assist Pt/caregiver will Perform Home Exercise Program: Both right and left upper extremity;Increased strength;With written HEP provided (equipment as needed and appropriate)  OT Frequency: Min 2X/week   Barriers to D/C: Inaccessible home environment (patient with stairs to enter house)          End of Session Equipment Utilized During Treatment: Gait belt;Rolling walker  Activity Tolerance: Patient tolerated treatment well Patient left: in chair;with call bell/phone within reach   Time: 1136-1208 OT Time Calculation (min): 32 min Charges:  OT General Charges $OT Visit: 1 Procedure OT Evaluation $Initial OT Evaluation Tier I: 1 Procedure OT Treatments $Self Care/Home Management : 8-22 mins  Alessandria Henken , MS, OTR/L, CLT Pager: [phone]  04/06/2014, 12:21 PM

## 2014-04-06 NOTE — Progress Notes (Signed)
Subjective: 2 Days Post-Op Procedure(s) (LRB): COMPRESSION HIP LEFT (Left) Patient reports pain as moderate.  Asymptomatic acute blood loss anemia.  Mobility on limited by needing to be only 50% weight on that left hip for now.  Objective: Vital signs in last 24 hours: Temp:  [97.6 F (36.4 C)-98.1 F (36.7 C)] 97.6 F (36.4 C) (01/18 0518) Pulse Rate:  [71-80] 71 (01/18 0518) Resp:  [16-18] 16 (01/18 0518) BP: (112-134)/(56-74) 123/74 mmHg (01/18 0518) SpO2:  [93 %-95 %] 94 % (01/18 0518)  Intake/Output from previous day: 01/17 0701 - 01/18 0700 In: 320 [P.O.:320] Out: 550 [Urine:550] Intake/Output this shift: Total I/O In: 240 [P.O.:240] Out: 200 [Urine:200]   Recent Labs  04/03/14 2135 04/04/14 0502 04/05/14 0533 04/06/14 0519  HGB 13.2 12.7* 10.3* 10.0*    Recent Labs  04/05/14 0533 04/06/14 0519  WBC 4.8 5.0  RBC 3.04* 2.92*  HCT 29.1* 28.0*  PLT 111* 117*    Recent Labs  04/05/14 0533 04/06/14 0519  NA 138 134*  K 3.8 3.7  CL 108 104  CO2 22 26  BUN 15 13  CREATININE 0.88 0.96  GLUCOSE 147* 143*  CALCIUM 8.4 8.7    Recent Labs  04/03/14 2135  INR 1.25    Intact pulses distally Dorsiflexion/Plantar flexion intact Incision: scant drainage No cellulitis present  Assessment/Plan: 2 Days Post-Op Procedure(s) (LRB): COMPRESSION HIP LEFT (Left) Up with therapy  Marda Breidenbach Y 04/06/2014, 12:03 PM

## 2014-04-06 NOTE — Progress Notes (Signed)
Rehab Admissions Coordinator Note:  Patient was screened by Retta Diones for appropriateness for an Inpatient Acute Rehab Consult.  At this time, we are recommending [facility].  Patient has [employer].  Given current diagnosis, insurance carrier likely will not approve an inpatient rehab admission.  Hence, patient choices likely to be SNF or HH therapies.  Call me for questions.    Retta Diones 04/06/2014, 12:56 PM  I can be reached at [phone].

## 2014-04-06 NOTE — Progress Notes (Signed)
Physical Therapy Treatment Patient Details Name: ELIYAH BAZZI MRN: [id number] DOB: 04-21-1933 Today's Date: 04/06/2014    History of Present Illness Admitted post fall with hip fx; now s/p ORIF (Compression Screw); 50%PWB    PT Comments    Steady progress with mobility today, with noted improvement in LLE advancement/stepping; much improved activity tolerance compared to yesterday's session;   Will need to have some focus on therex next session.   Follow Up Recommendations  SNF     Equipment Recommendations  Rolling walker with 5" wheels;3in1 (PT)    Recommendations for Other Services       Precautions / Restrictions Precautions Precautions: Fall Restrictions LLE Weight Bearing: Partial weight bearing LLE Partial Weight Bearing Percentage or Pounds: 50%    Mobility  Bed Mobility Overal bed mobility: Needs Assistance Bed Mobility: Supine to Sit     Supine to sit: Mod assist     General bed mobility comments: Patient requires cues for proper technique and physical assistance for LLE; this afternoon, pt requiring handheld assist to pull up to sit  Transfers Overall transfer level: Needs assistance Equipment used: Rolling walker (2 wheeled) Transfers: Sit to/from Stand Sit to Stand: Mod assist         General transfer comment: Patient requires cues for proper technique, safety, and PWB status  Ambulation/Gait Ambulation/Gait assistance: Mod assist;+2 safety/equipment Ambulation Distance (Feet): 20 Feet (with one seated rest break on commode) Assistive device: Rolling walker (2 wheeled) Gait Pattern/deviations: Step-to pattern;Trunk flexed Gait velocity: quite slow   General Gait Details: Step-by-step cues for gait sequence and PWB LLE; Continued difficulty advancing LLE, though improved with cues to weight shift fully into R stance, needed assist to advance LLE approx half of his steps   Stairs            Wheelchair Mobility    Modified  Rankin (Stroke Patients Only)       Balance                                    Cognition Arousal/Alertness: Awake/alert Behavior During Therapy: WFL for tasks assessed/performed Overall Cognitive Status: Within Functional Limits for tasks assessed                      Exercises Total Joint Exercises Ankle Circles/Pumps:  (Had to defer therex due to imminenet need for bathroom)    General Comments        Pertinent Vitals/Pain Pain Assessment: 0-10 Pain Score: 4  Pain Location: L hip Pain Descriptors / Indicators: Aching Pain Intervention(s): Limited activity within patient's tolerance    Home Living                      Prior Function            PT Goals (current goals can now be found in the care plan section) Acute Rehab PT Goals Patient Stated Goal: Wants to be able to be independent PT Goal Formulation: With patient Time For Goal Achievement: 04/19/14 Potential to Achieve Goals: Good Progress towards PT goals: Progressing toward goals    Frequency  Min 5X/week    PT Plan Current plan remains appropriate    Co-evaluation             End of Session Equipment Utilized During Treatment: Gait belt Activity Tolerance: Patient tolerated treatment well;Patient limited by fatigue Patient left:  in chair;with call bell/phone within reach     Time: 1441-1504 PT Time Calculation (min) (ACUTE ONLY): 23 min  Charges:  $Gait Training: 8-22 mins $Therapeutic Activity: 8-22 mins                    G Codes:      Quin Hoop 04/06/2014, 4:26 PM  Roney Marion, [place] Pager [phone] Office [phone]

## 2014-04-06 NOTE — Progress Notes (Signed)
PROGRESS NOTE  Lucas Morales Lucas Morales:[id number] DOB: 07/17/1933 DOA: 04/03/2014 PCP: Horatio Pel, MD  HPI: Lucas Morales is a 81 y.o. male with PMHx significant for hyperlipidemia, hypertension, CAD, arthritis, presenting to the emergency department for evaluation of left-sided hip pain after a mechanical fall. Patient reports that was walking into a building when he slipped, causing him to fall at about 7:00 PM. He started have pain over left hip. He denies hitting his head or loss of consciousness. He states he unable to ambulate. Patient denies fever, chills, fatigue, headaches, cough, chest pain, SOB, abdominal pain, diarrhea, constipation, dysuria, urgency, frequency, hematuria, skin rashes and leg swelling. Work up in the ED demonstrates Proximal left femur fracture by x-ray. Patient is admitted to inpatient for further evaluation and treatment. Orthopedic surgeon was consulted by ED.  Subjective / 24 H Interval events - His feeling better this morning, was hard working with physical therapy yesterday, looking forward to working with him again today.  Assessment/Plan: Principal Problem:   Fracture, femur Active Problems:   ESOPHAGEAL STRICTURE   GERD   Hypertension   Hyperlipidemia   CAD -- mLAD 80&-60%, OM2 90%, OM3 70-80%, RPDA 90%, cath 01/11/12   S/P CABG x 4, elective, 01/18/12   OSA (obstructive sleep apnea)   Closed left hip fracture   Femur fracture   Other specified diabetes mellitus without complications   Acute blood loss anemia   Thrombocytopenia  Left hip with displaced intertrochanteric femur fracture, - orthopedic surgery consulted, recent underwent hip repair on 1/16, doing well postop. - PT seen, recommended CIR, awaiting recommendations  Post op ABLA - monitor, no needs for transfusion - Stable this morning  Thrombocytopenia - acute on chronic, monitor.  Coronary artery disease - status post CABG, no chest pain, stable for now, continue home  medications  Diabetes mellitus - sliding scale insulin  Hypertension - continue Coreg  Hyperlipidemia - continue statin    Diet: Diet Carb Modified Fluids: NS DVT Prophylaxis: per ortho  Code Status: Full Code Family Communication: d/w patient Disposition Plan: remain inpatient  Consultants:  Orthopedic surgery   Procedures:  Repair left hip fx 1/16   Antibiotics  Anti-infectives    Start     Dose/Rate Route Frequency Ordered Stop   04/04/14 1230  ceFAZolin (ANCEF) IVPB 2 g/50 mL premix     2 g100 mL/hr over 30 Minutes Intravenous Every 6 hours 04/04/14 1217 04/04/14 2120   04/04/14 1000  ceFAZolin (ANCEF) IVPB 2 g/50 mL premix     2 g100 mL/hr over 30 Minutes Intravenous  Once 04/04/14 0904     04/04/14 0911  ceFAZolin (ANCEF) 2-3 GM-% IVPB SOLR    Comments:  Ancil Boozer  : cabinet override      04/04/14 0911 04/04/14 0947      Studies No results found.  Objective  Filed Vitals:   04/05/14 1230 04/05/14 2029 04/06/14 0518 04/06/14 1231  BP: 112/56 134/69 123/74 109/83  Pulse:  80 71 60  Temp: 98.1 F (36.7 C) 97.8 F (36.6 C) 97.6 F (36.4 C) 97.8 F (36.6 C)  TempSrc: Oral Oral Oral   Resp: 18 16 16 18   Height:      Weight:      SpO2: 95% 93% 94% 94%    Intake/Output Summary (Last 24 hours) at 04/06/14 1426 Last data filed at 04/06/14 1100  Gross per 24 hour  Intake    240 ml  Output    550 ml  Net   -310 ml   Filed Weights   04/03/14 2024  Weight: 107.502 kg (237 lb)    Exam:  General:  NAD  HEENT: no scleral icterus  Cardiovascular: RRR  Respiratory: CTA biL  Abdomen: soft, non tender  Skin: no rashes  Neuro: non focal  Data Reviewed: Basic Metabolic Panel:  Recent Labs Lab 04/03/14 2135 04/04/14 0502 04/05/14 0533 04/06/14 0519  NA 138 137 138 134*  K 3.9 4.0 3.8 3.7  CL 107 105 108 104  CO2 23 23 22 26   GLUCOSE 168* 133* 147* 143*  BUN 21 20 15 13   CREATININE 0.95 0.96 0.88 0.96  CALCIUM 9.4 9.0 8.4  8.7   CBC:  Recent Labs Lab 04/03/14 2135 04/04/14 0502 04/05/14 0533 04/06/14 0519  WBC 7.6 6.1 4.8 5.0  NEUTROABS 5.7  --   --   --   HGB 13.2 12.7* 10.3* 10.0*  HCT 37.0* 36.7* 29.1* 28.0*  MCV 93.0 95.1 95.7 95.9  PLT 136* 148* 111* 117*   CBG:  Recent Labs Lab 04/05/14 1135 04/05/14 1634 04/05/14 2126 04/06/14 0619 04/06/14 1141  GLUCAP 138* 160* 118* 137* 141*    Recent Results (from the past 240 hour(s))  MRSA PCR Screening     Status: None   Collection Time: 04/04/14  1:40 AM  Result Value Ref Range Status   MRSA by PCR NEGATIVE NEGATIVE Final    Comment:        The GeneXpert MRSA Assay (FDA approved for NASAL specimens only), is one component of a comprehensive MRSA colonization surveillance program. It is not intended to diagnose MRSA infection nor to guide or monitor treatment for MRSA infections.      Scheduled Meds: . aspirin EC  325 mg Oral BID PC  . atorvastatin  40 mg Oral q1800  . calcium carbonate  3-4 tablet Oral Daily  . carvedilol  6.25 mg Oral BID  .  ceFAZolin (ANCEF) IV  2 g Intravenous Once  . insulin aspart  0-9 Units Subcutaneous TID WC  . loratadine  10 mg Oral Daily  . multivitamin with minerals  1 tablet Oral Daily  . pantoprazole  40 mg Oral Daily  . polycarbophil  625 mg Oral Daily   Continuous Infusions: . lactated ringers 10 mL/hr at 04/04/14 0915    Marzetta Board, MD [facility] Pager [phone]. If 7 PM - 7 AM, please contact night-coverage at [email or web address], password [facility] 04/06/2014, 2:26 PM  LOS: 3 days

## 2014-04-07 LAB — CBC
HEMATOCRIT: 27 % — AB (ref 39.0–52.0)
Hemoglobin: 9.6 g/dL — ABNORMAL LOW (ref 13.0–17.0)
MCH: 33.3 pg (ref 26.0–34.0)
MCHC: 35.6 g/dL (ref 30.0–36.0)
MCV: 93.8 fL (ref 78.0–100.0)
Platelets: 134 10*3/uL — ABNORMAL LOW (ref 150–400)
RBC: 2.88 MIL/uL — ABNORMAL LOW (ref 4.22–5.81)
RDW: 14.9 % (ref 11.5–15.5)
WBC: 4.5 10*3/uL (ref 4.0–10.5)

## 2014-04-07 MED ORDER — OXYCODONE-ACETAMINOPHEN 5-325 MG PO TABS: 1.0000 | ORAL_TABLET | ORAL | Status: DC | PRN

## 2014-04-07 MED ORDER — ASPIRIN 325 MG PO TBEC: 325.0000 mg | DELAYED_RELEASE_TABLET | Freq: Two times a day (BID) | ORAL | Status: DC

## 2014-04-07 MED ORDER — METHOCARBAMOL 500 MG PO TABS: 500.0000 mg | ORAL_TABLET | Freq: Four times a day (QID) | ORAL | Status: DC | PRN

## 2014-04-07 NOTE — Clinical Social Work Placement (Signed)
Clinical Social Work Department CLINICAL SOCIAL WORK PLACEMENT NOTE 1/61/0960  Patient:  Lucas Morales, Lucas Morales  Account Number:  [id number] Admit date:  04/03/2014  Clinical Social Worker:  Durward Fortes, CLINICAL SOCIAL WORKER  Date/time:  04/07/2014 11:57 AM  Clinical Social Work is seeking post-discharge placement for this patient at the following level of care:   [place]   (*CSW will update this form in Epic as items are completed)   04/07/2014  Patient/family provided with [place] Department of Clinical Social Work's list of facilities offering this level of care within the geographic area requested by the patient (or if unable, by the patient's family).  04/07/2014  Patient/family informed of their freedom to choose among providers that offer the needed level of care, that participate in Medicare, Medicaid or managed care program needed by the patient, have an available bed and are willing to accept the patient.  04/07/2014  Patient/family informed of [facility]' ownership interest in [facility], as well as of the fact that they are under no obligation to receive care at this facility.  PASARR submitted to EDS on 04/07/2014 PASARR number received on 04/07/2014  FL2 transmitted to all facilities in geographic area requested by pt/family on  04/07/2014 FL2 transmitted to all facilities within larger geographic area on   Patient informed that his/her managed care company has contracts with or will negotiate with  certain facilities, including the following:     Patient/family informed of bed offers received:  04/07/2014 Patient chooses bed at  Physician recommends and patient chooses bed at    Patient to be transferred to  on   Patient to be transferred to facility by  Patient and family notified of transfer on  Name of family member notified:    The following physician request were entered in Epic:      Durward Fortes, BSW-Intern  Additional  Comments:

## 2014-04-07 NOTE — Progress Notes (Signed)
Physical Therapy Treatment Patient Details Name: EURA Morales MRN: [id number] DOB: Apr 10, 1933 Today's Date: 04/07/2014    History of Present Illness Admitted post fall with hip fx; now s/p ORIF (Compression Screw); 50%PWB    PT Comments    Much improved L hip motion/muscle activation around hip with noted imporved ability to advance LLE for stepping; Pt is very motivated to move better, and Overall progressing well; Anticipate continuing good progress at post-acute rehabilitation.    Follow Up Recommendations  SNF     Equipment Recommendations  Rolling walker with 5" wheels;3in1 (PT)    Recommendations for Other Services       Precautions / Restrictions Precautions Precautions: Fall Restrictions Weight Bearing Restrictions: Yes LLE Weight Bearing: Partial weight bearing LLE Partial Weight Bearing Percentage or Pounds: 50%    Mobility  Bed Mobility Overal bed mobility: Needs Assistance Bed Mobility: Supine to Sit     Supine to sit: Mod assist     General bed mobility comments: Patient requires cues for proper technique and physical assistance for LLE; this afternoon, pt requiring handheld assist to pull up to sit  Transfers Overall transfer level: Needs assistance Equipment used: Rolling walker (2 wheeled) Transfers: Sit to/from Stand Sit to Stand: Min assist         General transfer comment: Patient requires cues for proper technique, safety, and PWB status  Ambulation/Gait Ambulation/Gait assistance: Min assist;+2 safety/equipment Ambulation Distance (Feet): 22 Feet (without seated rest break) Assistive device: Rolling walker (2 wheeled) Gait Pattern/deviations: Step-to pattern Gait velocity: quite slow   General Gait Details: Step-by-step cues for gait sequence and PWB LLE; Continued difficulty advancing LLE, though improved with cues to weight shift fully into R stance; cues also for optimal step length   Stairs            Wheelchair  Mobility    Modified Rankin (Stroke Patients Only)       Balance             Standing balance-Leahy Scale: Poor                      Cognition Arousal/Alertness: Awake/alert Behavior During Therapy: WFL for tasks assessed/performed Overall Cognitive Status: Within Functional Limits for tasks assessed                      Exercises Total Joint Exercises Quad Sets: AROM;Left;10 reps Gluteal Sets: AROM;Both;10 reps Towel Squeeze: AROM;Both;10 reps Heel Slides: AAROM;Left;10 reps Hip ABduction/ADduction: AAROM;Left;10 reps    General Comments  Recommend overbed frame and trapeze to give pt more control over his own positioning in bed; I believe he will benefit from having more independence.      Pertinent Vitals/Pain Pain Assessment: Faces Faces Pain Scale: Hurts even more Pain Location: L hip towards end of walk Pain Descriptors / Indicators: Aching;Grimacing Pain Intervention(s): Limited activity within patient's tolerance;Monitored during session (pt declined premedication for pain)    Home Living                      Prior Function            PT Goals (current goals can now be found in the care plan section) Acute Rehab PT Goals Patient Stated Goal: Wants to be able to be independent PT Goal Formulation: With patient Time For Goal Achievement: 04/19/14 Potential to Achieve Goals: Good    Frequency  Min 5X/week    PT Plan  Current plan remains appropriate    Co-evaluation             End of Session Equipment Utilized During Treatment: Gait belt Activity Tolerance: Patient tolerated treatment well;Patient limited by fatigue Patient left: in chair;with call bell/phone within reach     Time: 1015-1035 PT Time Calculation (min) (ACUTE ONLY): 20 min  Charges:  $Gait Training: 8-22 mins                    G Codes:      Roney Marion Hamff 04/07/2014, 12:04 PM  Roney Marion, [place] Pager [phone] Office [phone]

## 2014-04-07 NOTE — Progress Notes (Signed)
PROGRESS NOTE  Lucas Morales UKG:[id number] DOB: September 18, 1933 DOA: 04/03/2014 PCP: Horatio Pel, MD  HPI: Lucas Morales is a 81 y.o. male with PMHx significant for hyperlipidemia, hypertension, CAD, arthritis, presenting to the emergency department for evaluation of left-sided hip pain after a mechanical fall. Patient reports that was walking into a building when he slipped, causing him to fall at about 7:00 PM. He started have pain over left hip. He denies hitting his head or loss of consciousness. He states he unable to ambulate. Patient denies fever, chills, fatigue, headaches, cough, chest pain, SOB, abdominal pain, diarrhea, constipation, dysuria, urgency, frequency, hematuria, skin rashes and leg swelling. Work up in the ED demonstrates Proximal left femur fracture by x-ray. Patient is admitted to inpatient for further evaluation and treatment. Orthopedic surgeon was consulted by ED.  Subjective / 24 H Interval events - His feeling better this morning, sleepy as just worked with PT  Assessment/Plan: Principal Problem:   Fracture, femur Active Problems:   ESOPHAGEAL STRICTURE   GERD   Hypertension   Hyperlipidemia   CAD -- mLAD 80&-60%, OM2 90%, OM3 70-80%, RPDA 90%, cath 01/11/12   S/P CABG x 4, elective, 01/18/12   OSA (obstructive sleep apnea)   Closed left hip fracture   Femur fracture   Other specified diabetes mellitus without complications   Acute blood loss anemia   Thrombocytopenia  Left hip with displaced intertrochanteric femur fracture, - orthopedic surgery consulted, recent underwent hip repair on 1/16, doing well postop. - PT seen, recommended CIR, CIR recommending SNF, awaiting insurance authorization, likely discharge 1/20  Post op ABLA - monitor, no needs for transfusion - Stable  Thrombocytopenia - acute on chronic, monitor.  Coronary artery disease - status post CABG, no chest pain, stable for now, continue home medications  Diabetes  mellitus - sliding scale insulin  Hypertension - continue Coreg  Hyperlipidemia - continue statin    Diet: Diet Carb Modified Fluids: NS DVT Prophylaxis: per ortho  Code Status: Full Code Family Communication: d/w patient Disposition Plan: remain inpatient, SNF 1/20  Consultants:  Orthopedic surgery   Procedures:  Repair left hip fx 1/16   Antibiotics  Anti-infectives    Start     Dose/Rate Route Frequency Ordered Stop   04/04/14 1230  ceFAZolin (ANCEF) IVPB 2 g/50 mL premix     2 g100 mL/hr over 30 Minutes Intravenous Every 6 hours 04/04/14 1217 04/04/14 2120   04/04/14 1000  ceFAZolin (ANCEF) IVPB 2 g/50 mL premix     2 g100 mL/hr over 30 Minutes Intravenous  Once 04/04/14 0904     04/04/14 0911  ceFAZolin (ANCEF) 2-3 GM-% IVPB SOLR    Comments:  Ancil Boozer  : cabinet override      04/04/14 0911 04/04/14 0947      Studies No results found.  Objective  Filed Vitals:   04/06/14 2011 04/07/14 0612 04/07/14 0800 04/07/14 1200  BP: 141/72 130/70    Pulse: 80 70    Temp: 98.4 F (36.9 C) 97.6 F (36.4 C)    TempSrc: Oral Oral    Resp:   18 18  Height:      Weight:      SpO2: 95%  95% 95%    Intake/Output Summary (Last 24 hours) at 04/07/14 1400 Last data filed at 04/07/14 0128  Gross per 24 hour  Intake    480 ml  Output    400 ml  Net     80 ml  Filed Weights   04/03/14 2024  Weight: 107.502 kg (237 lb)    Exam:  General:  NAD  HEENT: no scleral icterus  Cardiovascular: RRR  Respiratory: CTA biL  Abdomen: soft, non tender  Skin: no rashes  Neuro: non focal  Data Reviewed: Basic Metabolic Panel:  Recent Labs Lab 04/03/14 2135 04/04/14 0502 04/05/14 0533 04/06/14 0519  NA 138 137 138 134*  K 3.9 4.0 3.8 3.7  CL 107 105 108 104  CO2 23 23 22 26   GLUCOSE 168* 133* 147* 143*  BUN 21 20 15 13   CREATININE 0.95 0.96 0.88 0.96  CALCIUM 9.4 9.0 8.4 8.7   CBC:  Recent Labs Lab 04/03/14 2135 04/04/14 0502  04/05/14 0533 04/06/14 0519 04/07/14 0511  WBC 7.6 6.1 4.8 5.0 4.5  NEUTROABS 5.7  --   --   --   --   HGB 13.2 12.7* 10.3* 10.0* 9.6*  HCT 37.0* 36.7* 29.1* 28.0* 27.0*  MCV 93.0 95.1 95.7 95.9 93.8  PLT 136* 148* 111* 117* 134*   CBG:  Recent Labs Lab 04/05/14 1634 04/05/14 2126 04/06/14 0619 04/06/14 1141 04/06/14 1627  GLUCAP 160* 118* 137* 141* 144*    Recent Results (from the past 240 hour(s))  MRSA PCR Screening     Status: None   Collection Time: 04/04/14  1:40 AM  Result Value Ref Range Status   MRSA by PCR NEGATIVE NEGATIVE Final    Comment:        The GeneXpert MRSA Assay (FDA approved for NASAL specimens only), is one component of a comprehensive MRSA colonization surveillance program. It is not intended to diagnose MRSA infection nor to guide or monitor treatment for MRSA infections.      Scheduled Meds: . aspirin EC  325 mg Oral BID PC  . atorvastatin  40 mg Oral q1800  . calcium carbonate  3-4 tablet Oral Daily  . carvedilol  6.25 mg Oral BID  .  ceFAZolin (ANCEF) IV  2 g Intravenous Once  . insulin aspart  0-9 Units Subcutaneous TID WC  . loratadine  10 mg Oral Daily  . multivitamin with minerals  1 tablet Oral Daily  . pantoprazole  40 mg Oral Daily  . polycarbophil  625 mg Oral Daily   Continuous Infusions: . lactated ringers 10 mL/hr at 04/04/14 0915    Marzetta Board, MD [facility] Pager [phone]. If 7 PM - 7 AM, please contact night-coverage at [email or web address], password [facility] 04/07/2014, 2:00 PM  LOS: 4 days

## 2014-04-07 NOTE — Clinical Social Work Psychosocial (Addendum)
Clinical Social Work Department BRIEF PSYCHOSOCIAL ASSESSMENT 8/00/4471  Patient:  Lucas Morales, Lucas Morales     Account Number:  [id number]     Admit date:  04/03/2014  Clinical Social Worker:  Durward Fortes, CLINICAL SOCIAL WORKER  Date/Time:  04/07/2014 11:28 AM  Referred by:  Physician  Date Referred:  04/07/2014 Referred for  SNF Placement   Other Referral:   none.   Interview type:  Patient Other interview type:   none.    PSYCHOSOCIAL DATA Living Status:  WIFE Admitted from facility:   Level of care:   Primary support name:  Lucas Morales Primary support relationship to patient:  SPOUSE Degree of support available:   Adequate Support.    CURRENT CONCERNS Current Concerns  Post-Acute Placement   Other Concerns:   none.    SOCIAL WORK ASSESSMENT / PLAN CSW and BSW-Intern consulted regarding SNF placement for pt once medically stable for discharge. BSW-Intern met with patient and patient's wife at bedside regarding discharge disposition. Patient's wife stated patient and patient's wife agreeable to SNF placement at time of discharge.    CSW and BSW-Intern will continue to assist with discharge planning for pt at time of discharge.   Assessment/plan status:  Psychosocial Support/Ongoing Assessment of Needs Other assessment/ plan:   none.   Information/referral to community resources:   [facility] bed offers.    PATIENT'S/FAMILY'S RESPONSE TO PLAN OF CARE: Pt and pt's wife understanding and agreeable to CSW plan of care. Pt and pt's wife expressed no further questions or concerns at this time. CSW and BSW-Intern to continue to assist with discharge planning needs.        Lucas Morales, BSW-Intern

## 2014-04-08 ENCOUNTER — Encounter (HOSPITAL_COMMUNITY): Payer: Self-pay | Admitting: Orthopaedic Surgery

## 2014-04-08 LAB — BASIC METABOLIC PANEL
Anion gap: 10 (ref 5–15)
BUN: 18 mg/dL (ref 6–23)
CO2: 27 mmol/L (ref 19–32)
CREATININE: 0.97 mg/dL (ref 0.50–1.35)
Calcium: 8.7 mg/dL (ref 8.4–10.5)
Chloride: 101 mEq/L (ref 96–112)
GFR calc Af Amer: 89 mL/min — ABNORMAL LOW (ref >90)
GFR, EST NON AFRICAN AMERICAN: 77 mL/min — AB (ref >90)
Glucose, Bld: 136 mg/dL — ABNORMAL HIGH (ref 70–99)
Potassium: 3.6 mmol/L (ref 3.5–5.1)
Sodium: 138 mmol/L (ref 135–145)

## 2014-04-08 LAB — CBC
HEMATOCRIT: 27.7 % — AB (ref 39.0–52.0)
HEMOGLOBIN: 9.7 g/dL — AB (ref 13.0–17.0)
MCH: 32.7 pg (ref 26.0–34.0)
MCHC: 35 g/dL (ref 30.0–36.0)
MCV: 93.3 fL (ref 78.0–100.0)
Platelets: 162 10*3/uL (ref 150–400)
RBC: 2.97 MIL/uL — AB (ref 4.22–5.81)
RDW: 15.1 % (ref 11.5–15.5)
WBC: 3.9 10*3/uL — ABNORMAL LOW (ref 4.0–10.5)

## 2014-04-08 LAB — GLUCOSE, CAPILLARY
Glucose-Capillary: 124 mg/dL — ABNORMAL HIGH (ref 70–99)
Glucose-Capillary: 138 mg/dL — ABNORMAL HIGH (ref 70–99)
Glucose-Capillary: 131 mg/dL — ABNORMAL HIGH (ref 70–99)

## 2014-04-08 NOTE — Progress Notes (Signed)
Report called to April at [facility].

## 2014-04-08 NOTE — Discharge Summary (Signed)
NASSIR NEIDERT, is a 81 y.o. male  DOB Apr 28, 1933  MRN [id number].  Admission date:  04/03/2014  Admitting Physician  Ivor Costa, MD  Discharge Date:  04/08/2014   Primary MD  Horatio Pel, MD  Recommendations for primary care physician for things to follow:   Check CBC, BMP next visit.   Admission Diagnosis  Fall [W19.XXXA] Closed left hip fracture, initial encounter [S72.002A]   Discharge Diagnosis  Fall [W19.XXXA] Closed left hip fracture, initial encounter [S72.002A]     Principal Problem:   Fracture, femur Active Problems:   ESOPHAGEAL STRICTURE   GERD   Hypertension   Hyperlipidemia   CAD -- mLAD 80&-60%, OM2 90%, OM3 70-80%, RPDA 90%, cath 01/11/12   S/P CABG x 4, elective, 01/18/12   OSA (obstructive sleep apnea)   Closed left hip fracture   Femur fracture   Other specified diabetes mellitus without complications   Acute blood loss anemia   Thrombocytopenia      Past Medical History  Diagnosis Date  . Adenomatous colon polyp   . Hemorrhoids   . Diverticulosis   . Gastritis   . IC (interstitial cystitis)   . Esophageal stricture   . Diverticulitis   . Hyperlipidemia     doesn't require meds  . Anginal pain 01/11/2012  . Bronchitis     "I've had it a few times" (01/11/2012)  . Pre-diabetes     "check my sugars q 3 days" (01/11/2012)  . Gout   . Dysrhythmia     bradycardia in the 30's (01/11/2012)  . Coronary artery disease   . Hypertension     takes Losartan daily  . Diabetes mellitus without complication     "borderline"  . History of bronchitis     last time about 3-64yrs ago  . Joint pain   . Joint swelling   . Gout     takes ALlopurinol daily  . GERD (gastroesophageal reflux disease)     takes Pantoprazole daily  . History of colon polyps   . Urinary frequency   .  Interstitial cystitis   . Depression     does't require any meds  . Nocturia   . Insomnia     takes Nortrypyline nightly  . Shortness of breath 01/11/2012    pt states all of the time drSOOD DID PFT RECENTLY  . Hiatal hernia   . Esophageal dysmotility   . Arthritis     "knees" (01/11/2012)  . Paroxysmal atrial fibrillation   . Hyperlipidemia   . Dyspnea on exertion   . OSA (obstructive sleep apnea) 10/21/2012    Past Surgical History  Procedure Laterality Date  . Reconstruction medial collateral ligament elbow w/ tendon graft  1988 X 2    right  . Total knee arthroplasty  ~ 2001; 2011    right; left  . Bladder lesion removed    . Exploratory laparotomy  2009    with right colectomy  . Tonsillectomy and adenoidectomy  ~1943  . Appendectomy  2009  .  Cholecystectomy  2009  . Lumbar disc surgery  ?2010    "cleaned out the stenosis" (01/11/2012)  . Cystoscopy with ureteroscopy  2008; 2009    "painted inside of bladder wall"  . Cataract extraction w/ intraocular lens  implant, bilateral  2011  . Tumor excision  12/2011    left arm  . Elbow surgery  1988    right  . Colectomy      d/t diverticulosis  . Heel spurs removed  25+yrs ago    bil   . Colonoscopy    . Esophagogastroduodenoscopy    . Coronary artery bypass graft  01/18/2012    Procedure: CORONARY ARTERY BYPASS GRAFTING (CABG);  Surgeon: Ivin Poot, MD;  Location: [facility];  Service: Open Heart Surgery;  Laterality: N/A;  times four, using left internal mammary artery  . Endovein harvest of greater saphenous vein  01/18/2012    Procedure: ENDOVEIN HARVEST OF GREATER SAPHENOUS VEIN;  Surgeon: Ivin Poot, MD;  Location: [facility];  Service: Open Heart Surgery;  Laterality: Right;  . Cardiac catheterization  01/11/2012  . Joint replacement  90's and 2000's    bil knee  . Pars plana vitrectomy w/ repair of macular hole Right 10/08/2012    Dr Zigmund Daniel  . 25 gauge pars plana vitrectomy with 20 gauge mvr port for  macular hole Right 10/08/2012    Procedure: 25 GAUGE PARS PLANA VITRECTOMY WITH 20 GAUGE MVR PORT FOR MACULAR HOLE; Membrame peel, serum patch; Laser treatment ; Gas exchange;  Surgeon: Hayden Pedro, MD;  Location: [facility];  Service: Ophthalmology;  Laterality: Right;  . Left heart catheterization with coronary angiogram N/A 01/11/2012    Procedure: LEFT HEART CATHETERIZATION WITH CORONARY ANGIOGRAM;  Surgeon: Leonie Man, MD;  [facility] CATH LAB;  Service: Cardiovascular;  Laterality: N/A;       History of present illness and  Hospital Course:     Kindly see H&P for history of present illness and admission details, please review complete Labs, Consult reports and Test reports for all details in brief  HPI  from the history and physical done on the day of admission   RAKIN LEMELLE is a 81 y.o. male with PMHx significant for hyperlipidemia, hypertension, CAD, arthritis, presenting to the emergency department for evaluation of left-sided hip pain after a mechanical fall. Patient reports that was walking into a building when he slipped, causing him to fall at about 7:00 PM. He started have pain over left hip. He denies hitting his head or loss of consciousness. He states he unable to ambulate. Patient denies fever, chills, fatigue, headaches, cough, chest pain, SOB, abdominal pain, diarrhea, constipation, dysuria, urgency, frequency, hematuria, skin rashes and leg swelling. Work up in the ED demonstrates Proximal left femur fracture by x-ray. Patient is admitted to inpatient for further evaluation and treatment. Orthopedic surgeon was consulted by ED.  Hospital Course    1. Mechanical fall with left intertrochanteric femoral fracture. Status post open reduction internal fixation by Dr. Ninfa Linden, tolerated the procedure well, minimal blood loss related anemia, seen by PT will require SNF. Aspirin 325 twice a day for DVT prophylaxis per orthopedics, 50% weightbearing on left leg per  orthopedics. Follow-up with Ortho [facility] as below post discharge.   2. History of CAD status post CABG. No chest pain no acute issues, continue home medications which include beta blocker, statin and aspirin for secondary prevention. Note aspirin dose to be adjusted by orthopedics after 3 week period.  4. DM type II. CBG stable continue home regimen.    5. Essential hypertension. Stable on Coreg.    6. Dyslipidemia. Continue home dose statin unchanged.    7. GERD. On PPI.        Discharge Condition: Stable   Follow UP  Follow-up Information    Follow up with Mcarthur Rossetti, MD. Schedule an appointment as soon as possible for a visit in 2 weeks.   Specialty:  Orthopedic Surgery   Contact information:   [place] [address] [phone]       Follow up with Horatio Pel, MD. Schedule an appointment as soon as possible for a visit in 1 week.   Specialty:  Internal Medicine   Contact information:   [address] [place] [address] [phone]         Discharge Instructions  and  Discharge Medications      Discharge Instructions    Discharge instructions    Complete by:  As directed   Only 50% weight on your left hip until further notice - likely up to 4 weeks. You can get your current hip dressing wet in the shower daily. You can start getting your actual incision/staples wet in the shower daily starting 04/10/14.   Follow with Primary MD Horatio Pel, MD in 7 days   Get CBC, CMP, 2 view Chest X ray checked  by Primary MD next visit.    Activity: As tolerated with Full fall precautions use walker/cane & assistance as needed   Disposition SNF   Diet: Heart Healthy Low Carb  For Heart failure patients - Check your Weight same time everyday, if you gain over 2 pounds, or you develop in leg swelling, experience more shortness of breath or chest pain, call your Primary MD immediately.  Follow Cardiac Low Salt Diet and 1.8 lit/day fluid restriction.   On your next visit with your primary care physician please Get Medicines reviewed and adjusted.   Please request your Prim.MD to go over all Hospital Tests and Procedure/Radiological results at the follow up, please get all Hospital records sent to your Prim MD by signing hospital release before you go home.   If you experience worsening of your admission symptoms, develop shortness of breath, life threatening emergency, suicidal or homicidal thoughts you must seek medical attention immediately by calling 911 or calling your MD immediately  if symptoms less severe.  You Must read complete instructions/literature along with all the possible adverse reactions/side effects for all the Medicines you take and that have been prescribed to you. Take any new Medicines after you have completely understood and accpet all the possible adverse reactions/side effects.   Do not drive, operating heavy machinery, perform activities at heights, swimming or participation in water activities or provide baby sitting services if your were admitted for syncope or siezures until you have seen by Primary MD or a Neurologist and advised to do so again.  Do not drive when taking Pain medications.    Do not take more than prescribed Pain, Sleep and Anxiety Medications  Special Instructions: If you have smoked or chewed Tobacco  in the last 2 yrs please stop smoking, stop any regular Alcohol  and or any Recreational drug use.  Wear Seat belts while driving.   Please note  You were cared for by a hospitalist during your hospital stay. If you have any questions about your discharge medications or the care you received while you were in  the hospital after you are discharged, you can call the unit and asked to speak with the hospitalist on call if the hospitalist that took care of you is not available. Once you are discharged, your primary care physician  will handle any further medical issues. Please note that NO REFILLS for any discharge medications will be authorized once you are discharged, as it is imperative that you return to your primary care physician (or establish a relationship with a primary care physician if you do not have one) for your aftercare needs so that they can reassess your need for medications and monitor your lab values.     Increase activity slowly    Complete by:  As directed      Partial weight bearing    Complete by:  As directed   % Body Weight:  50%  Laterality:  left  Extremity:  Lower            Medication List    STOP taking these medications        naproxen 500 MG tablet  Commonly known as:  NAPROSYN      TAKE these medications        albuterol 108 (90 BASE) MCG/ACT inhaler  Commonly known as:  PROAIR HFA  Inhale 2 puffs into the lungs every 6 (six) hours as needed for wheezing or shortness of breath.     aspirin 325 MG EC tablet  Take 1 tablet (325 mg total) by mouth 2 (two) times daily after a meal.     atorvastatin 40 MG tablet  Commonly known as:  LIPITOR  TAKE 1 TABLET BY MOUTH ONCE DAILY AT 6PM     calcium carbonate 750 MG chewable tablet  Commonly known as:  TUMS EX  Chew 3-4 tablets by mouth daily.     carvedilol 12.5 MG tablet  Commonly known as:  COREG  Take 0.5 tablets by mouth 2 (two) times daily.     cholestyramine 4 G packet  Commonly known as:  QUESTRAN  Take 4 g by mouth once.     methocarbamol 500 MG tablet  Commonly known as:  ROBAXIN  Take 1 tablet (500 mg total) by mouth every 6 (six) hours as needed for muscle spasms.     multivitamin with minerals tablet  Take 1 tablet by mouth daily.     oxyCODONE-acetaminophen 5-325 MG per tablet  Commonly known as:  ROXICET  Take 1-2 tablets by mouth every 4 (four) hours as needed for severe pain.     pantoprazole 40 MG tablet  Commonly known as:  PROTONIX  Take 1 tablet by mouth 2 (two) times daily.      Saxagliptin-Metformin 2.07-998 MG Tb24  Take 1 tablet by mouth daily.     tadalafil 5 MG tablet  Commonly known as:  CIALIS  Take 5 mg by mouth daily.          Diet and Activity recommendation: See Discharge Instructions above   Consults obtained - ORIF L Hip By Dr Ninfa Linden    Major procedures and Radiology Reports - PLEASE review detailed and final reports for all details, in brief -       Dg Pelvis Portable  04/03/2014   CLINICAL DATA:  Initial encounter for fall today with injured left hip. extreme pain.  EXAM: PORTABLE PELVIS 1-2 VIEWS  COMPARISON:  04/30/2009 CTA  FINDINGS: Comminuted intratrochanteric left femur fracture with varus angulation. No dislocation. Remainder of pelvis intact.  IMPRESSION: Proximal left  femur fracture.   Electronically Signed   By: Abigail Miyamoto M.D.   On: 04/03/2014 20:59   Dg Chest Port 1 View  04/03/2014   CLINICAL DATA:  Status post fall; concern for chest injury. Initial encounter.  EXAM: PORTABLE CHEST - 1 VIEW  COMPARISON:  Chest radiograph from 10/21/2012  FINDINGS: The lungs are well-aerated. Minimal bibasilar atelectasis is noted. There is no evidence of pleural effusion or pneumothorax.  The cardiomediastinal silhouette is borderline normal in size. The patient is status post median sternotomy, with evidence of prior CABG. No acute osseous abnormalities are seen. A moderate hiatal hernia is noted.  Apparent cervical spinal fusion hardware is noted. This is somewhat unusual in appearance, likely reflecting patient rotation; would correlate with the patient's history to ensure that this reflects cervical spinal hardware.  IMPRESSION: 1. Minimal bibasilar atelectasis noted. Lungs otherwise grossly clear. 2. No displaced rib fracture seen. 3. Moderate hiatal hernia noted. 4. Apparent cervical spinal fusion hardware noted. This is somewhat unusual in appearance, likely reflecting patient rotation. Would correlate with the patient's history to ensure  that this reflects cervical spinal hardware.   Electronically Signed   By: Garald Balding M.D.   On: 04/03/2014 21:52   Dg C-arm 1-60 Min  04/04/2014   CLINICAL DATA:  Intertrochanteric left hip fracture. Subsequent encounter.  EXAM: DG C-ARM 61-120 MIN; DG FEMUR 2+V*L*  : COMPARISON:  04/03/2014  FINDINGS: Multiple fluoroscopic spot images show placement of a compression screw and intramedullary rod transfixing the intertrochanteric left hip fracture in near anatomic alignment. Total left knee prosthesis also noted.  IMPRESSION: Internal fixation of intertrochanteric left hip fracture in near anatomic alignment.   Electronically Signed   By: Earle Gell M.D.   On: 04/04/2014 11:19   Dg Femur Min 2 Views Left  04/04/2014   CLINICAL DATA:  Intertrochanteric left hip fracture. Subsequent encounter.  EXAM: DG C-ARM 61-120 MIN; DG FEMUR 2+V*L*  : COMPARISON:  04/03/2014  FINDINGS: Multiple fluoroscopic spot images show placement of a compression screw and intramedullary rod transfixing the intertrochanteric left hip fracture in near anatomic alignment. Total left knee prosthesis also noted.  IMPRESSION: Internal fixation of intertrochanteric left hip fracture in near anatomic alignment.   Electronically Signed   By: Earle Gell M.D.   On: 04/04/2014 11:19    Micro Results      Recent Results (from the past 240 hour(s))  MRSA PCR Screening     Status: None   Collection Time: 04/04/14  1:40 AM  Result Value Ref Range Status   MRSA by PCR NEGATIVE NEGATIVE Final    Comment:        The GeneXpert MRSA Assay (FDA approved for NASAL specimens only), is one component of a comprehensive MRSA colonization surveillance program. It is not intended to diagnose MRSA infection nor to guide or monitor treatment for MRSA infections.        Today   Subjective:   Dallie Dad today has no headache,no chest abdominal pain,no new weakness tingling or numbness, feels much better.  Objective:    Blood pressure 125/51, pulse 81, temperature 97.9 F (36.6 C), temperature source Oral, resp. rate 18, height 6' (1.829 m), weight 107.502 kg (237 lb), SpO2 95 %.   Intake/Output Summary (Last 24 hours) at 04/08/14 0950 Last data filed at 04/08/14 0648  Gross per 24 hour  Intake    480 ml  Output    400 ml  Net     80  ml    Exam Awake Alert, Oriented x 3, No new F.N deficits, Normal affect [place].AT,PERRAL Supple Neck,No JVD, No cervical lymphadenopathy appriciated.  Symmetrical Chest wall movement, Good air movement bilaterally, CTAB RRR,No Gallops,Rubs or new Murmurs, No Parasternal Heave +ve B.Sounds, Abd Soft, Non tender, No organomegaly appriciated, No rebound -guarding or rigidity. No Cyanosis, Clubbing or edema, No new Rash or bruise  Data Review   CBC w Diff: Lab Results  Component Value Date   WBC 3.9* 04/08/2014   HGB 9.7* 04/08/2014   HCT 27.7* 04/08/2014   PLT 162 04/08/2014   LYMPHOPCT 17 04/03/2014   MONOPCT 6 04/03/2014   EOSPCT 2 04/03/2014   BASOPCT 0 04/03/2014    CMP: Lab Results  Component Value Date   NA 138 04/08/2014   K 3.6 04/08/2014   CL 101 04/08/2014   CO2 27 04/08/2014   BUN 18 04/08/2014   CREATININE 0.97 04/08/2014   PROT 5.8* 01/23/2012   ALBUMIN 3.1* 01/23/2012   BILITOT 0.9 01/23/2012   ALKPHOS 73 01/23/2012   AST 17 01/23/2012   ALT 16 01/23/2012  .   Total Time in preparing paper work, data evaluation and todays exam - 35 minutes  Thurnell Lose M.D on 04/08/2014 at 9:50 AM  [facility] Office  [phone]

## 2014-04-08 NOTE — Clinical Social Work Note (Signed)
Patient to be discharged to [facility]. Patient and patient's wife updated regarding discharge.  Facility: [facility] Report number: [phone] Transportation: EMS ([address]  Lubertha Sayres, [place] ([phone]) Licensed Clinical Social Worker Orthopedics [phone]) and Surgical [phone])

## 2014-04-08 NOTE — Discharge Instructions (Signed)
Only 50% weight on your left hip until further notice - likely up to 4 weeks. You can get your current hip dressing wet in the shower daily. You can start getting your actual incision/staples wet in the shower daily starting 04/10/14.   Follow with Primary MD Horatio Pel, MD in 7 days   Get CBC, CMP, 2 view Chest X ray checked  by Primary MD next visit.    Activity: As tolerated with Full fall precautions use walker/cane & assistance as needed   Disposition SNF   Diet: Heart Healthy Low Carb  For Heart failure patients - Check your Weight same time everyday, if you gain over 2 pounds, or you develop in leg swelling, experience more shortness of breath or chest pain, call your Primary MD immediately. Follow Cardiac Low Salt Diet and 1.8 lit/day fluid restriction.   On your next visit with your primary care physician please Get Medicines reviewed and adjusted.   Please request your Prim.MD to go over all Hospital Tests and Procedure/Radiological results at the follow up, please get all Hospital records sent to your Prim MD by signing hospital release before you go home.   If you experience worsening of your admission symptoms, develop shortness of breath, life threatening emergency, suicidal or homicidal thoughts you must seek medical attention immediately by calling 911 or calling your MD immediately  if symptoms less severe.  You Must read complete instructions/literature along with all the possible adverse reactions/side effects for all the Medicines you take and that have been prescribed to you. Take any new Medicines after you have completely understood and accpet all the possible adverse reactions/side effects.   Do not drive, operating heavy machinery, perform activities at heights, swimming or participation in water activities or provide baby sitting services if your were admitted for syncope or siezures until you have seen by Primary MD or a Neurologist and advised to  do so again.  Do not drive when taking Pain medications.    Do not take more than prescribed Pain, Sleep and Anxiety Medications  Special Instructions: If you have smoked or chewed Tobacco  in the last 2 yrs please stop smoking, stop any regular Alcohol  and or any Recreational drug use.  Wear Seat belts while driving.   Please note  You were cared for by a hospitalist during your hospital stay. If you have any questions about your discharge medications or the care you received while you were in the hospital after you are discharged, you can call the unit and asked to speak with the hospitalist on call if the hospitalist that took care of you is not available. Once you are discharged, your primary care physician will handle any further medical issues. Please note that NO REFILLS for any discharge medications will be authorized once you are discharged, as it is imperative that you return to your primary care physician (or establish a relationship with a primary care physician if you do not have one) for your aftercare needs so that they can reassess your need for medications and monitor your lab values.

## 2014-04-09 ENCOUNTER — Non-Acute Institutional Stay (SKILLED_NURSING_FACILITY): Payer: Medicare PPO | Admitting: Registered Nurse

## 2014-04-09 ENCOUNTER — Encounter: Payer: Self-pay | Admitting: Registered Nurse

## 2014-04-09 DIAGNOSIS — I10 Essential (primary) hypertension: Secondary | ICD-10-CM

## 2014-04-09 DIAGNOSIS — E785 Hyperlipidemia, unspecified: Secondary | ICD-10-CM

## 2014-04-09 DIAGNOSIS — D62 Acute posthemorrhagic anemia: Secondary | ICD-10-CM

## 2014-04-09 DIAGNOSIS — Z951 Presence of aortocoronary bypass graft: Secondary | ICD-10-CM

## 2014-04-09 DIAGNOSIS — E119 Type 2 diabetes mellitus without complications: Secondary | ICD-10-CM

## 2014-04-09 DIAGNOSIS — S72142S Displaced intertrochanteric fracture of left femur, sequela: Secondary | ICD-10-CM

## 2014-04-09 DIAGNOSIS — K219 Gastro-esophageal reflux disease without esophagitis: Secondary | ICD-10-CM

## 2014-04-09 NOTE — Progress Notes (Signed)
Patient ID: Lucas Morales, male   DOB: 06-28-1933, 81 y.o.   MRN: [id number]   Place of Service: [facility]  Allergies  Allergen Reactions  . Morphine Itching  . Desmopressin Itching  . Losartan     Itching    Code Status: Full Code  Goals of Care: Longevity/STR  Chief Complaint  Patient presents with  . Hospitalization Follow-up    HPI 81 y.o. male with PMH of HTN, HDL, CAD s/p CABGx4, OSA, GERD, paroxysmal afib, among others is being seen for a follow-up visit post hospital admission from 1/15/6 to 04/08/14 for closed left intertrochanteric fracture after a mechanical fall s/p ORIF by Dr. Ninfa Linden. He is here for STR and his goal is to return home this Sunday. Seen in room today. No complaints verbalized by patient. Pain is adequately controlled with current regimen. No issues with constipation. Not happy about being at SNF-contributing this to his elevated BP.   Review of Systems Constitutional: Negative for fever, chills, and fatigue. HENT: Negative for ear pain, congestion, and sore throat Eyes: Negative for eye pain, eye discharge, and visual disturbance  Cardiovascular: Negative for chest pain, palpitations, and leg swelling Respiratory: Negative cough, shortness of breath, and wheezing.  Gastrointestinal: Negative for nausea and vomiting. Negative for abdominal pain, diarrhea and constipation.  Genitourinary: Positive for interstitial cystitis  Endocrine: Negative for polydipsia, polyphagia, and polyuria Musculoskeletal: Negative for back pain, joint pain, and joint swelling  Neurological: Negative for dizziness and headache Skin: Negative for rash and pruritis  Psychiatric: Negative for depression  Past Medical History  Diagnosis Date  . Adenomatous colon polyp   . Hemorrhoids   . Diverticulosis   . Gastritis   . IC (interstitial cystitis)   . Esophageal stricture   . Diverticulitis   . Hyperlipidemia     doesn't require meds  . Anginal pain  01/11/2012  . Bronchitis     "I've had it a few times" (01/11/2012)  . Pre-diabetes     "check my sugars q 3 days" (01/11/2012)  . Gout   . Dysrhythmia     bradycardia in the 30's (01/11/2012)  . Coronary artery disease   . Hypertension     takes Losartan daily  . Diabetes mellitus without complication     "borderline"  . History of bronchitis     last time about 3-64yrs ago  . Joint pain   . Joint swelling   . Gout     takes ALlopurinol daily  . GERD (gastroesophageal reflux disease)     takes Pantoprazole daily  . History of colon polyps   . Urinary frequency   . Interstitial cystitis   . Depression     does't require any meds  . Nocturia   . Insomnia     takes Nortrypyline nightly  . Shortness of breath 01/11/2012    pt states all of the time drSOOD DID PFT RECENTLY  . Hiatal hernia   . Esophageal dysmotility   . Arthritis     "knees" (01/11/2012)  . Paroxysmal atrial fibrillation   . Hyperlipidemia   . Dyspnea on exertion   . OSA (obstructive sleep apnea) 10/21/2012    Past Surgical History  Procedure Laterality Date  . Reconstruction medial collateral ligament elbow w/ tendon graft  1988 X 2    right  . Total knee arthroplasty  ~ 2001; 2011    right; left  . Bladder lesion removed    . Exploratory laparotomy  2009  with right colectomy  . Tonsillectomy and adenoidectomy  ~1943  . Appendectomy  2009  . Cholecystectomy  2009  . Lumbar disc surgery  ?2010    "cleaned out the stenosis" (01/11/2012)  . Cystoscopy with ureteroscopy  2008; 2009    "painted inside of bladder wall"  . Cataract extraction w/ intraocular lens  implant, bilateral  2011  . Tumor excision  12/2011    left arm  . Elbow surgery  1988    right  . Colectomy      d/t diverticulosis  . Heel spurs removed  25+yrs ago    bil   . Colonoscopy    . Esophagogastroduodenoscopy    . Coronary artery bypass graft  01/18/2012    Procedure: CORONARY ARTERY BYPASS GRAFTING (CABG);  Surgeon:  Ivin Poot, MD;  Location: [place];  Service: Open Heart Surgery;  Laterality: N/A;  times four, using left internal mammary artery  . Endovein harvest of greater saphenous vein  01/18/2012    Procedure: ENDOVEIN HARVEST OF GREATER SAPHENOUS VEIN;  Surgeon: Ivin Poot, MD;  Location: [place];  Service: Open Heart Surgery;  Laterality: Right;  . Cardiac catheterization  01/11/2012  . Joint replacement  90's and 2000's    bil knee  . Pars plana vitrectomy w/ repair of macular hole Right 10/08/2012    Dr Zigmund Daniel  . 25 gauge pars plana vitrectomy with 20 gauge mvr port for macular hole Right 10/08/2012    Procedure: 25 GAUGE PARS PLANA VITRECTOMY WITH 20 GAUGE MVR PORT FOR MACULAR HOLE; Membrame peel, serum patch; Laser treatment ; Gas exchange;  Surgeon: Hayden Pedro, MD;  Location: [place];  Service: Ophthalmology;  Laterality: Right;  . Left heart catheterization with coronary angiogram N/A 01/11/2012    Procedure: LEFT HEART CATHETERIZATION WITH CORONARY ANGIOGRAM;  Surgeon: Leonie Man, MD;  Location: [facility] CATH LAB;  Service: Cardiovascular;  Laterality: N/A;  . Compression hip screw Left 04/04/2014    Procedure: COMPRESSION HIP LEFT;  Surgeon: Mcarthur Rossetti, MD;  Location: [place];  Service: Orthopedics;  Laterality: Left;    History  Substance Use Topics  . Smoking status: Never Smoker   . Smokeless tobacco: Never Used  . Alcohol Use: Yes     Comment: 09/30/2012 - has small glass of wine every other day;01/11/2012 "last alcohol > 2 yr ago; neer had problem w/it"     Family History  Problem Relation Age of Onset  . Colon cancer Neg Hx   . Kidney disease Father   . Emphysema Mother   . Emphysema Maternal Grandfather       Medication List       This list is accurate as of: 04/09/14  4:34 PM.  Always use your most recent med list.               aspirin 325 MG EC tablet  Take 1 tablet (325 mg total) by mouth 2 (two) times daily after a meal.     atorvastatin 40  MG tablet  Commonly known as:  LIPITOR  TAKE 1 TABLET BY MOUTH ONCE DAILY AT 6PM     calcium carbonate 750 MG chewable tablet  Commonly known as:  TUMS EX  Chew 3-4 tablets by mouth daily.     carvedilol 12.5 MG tablet  Commonly known as:  COREG  Take 0.5 tablets by mouth 2 (two) times daily.     cholestyramine 4 G packet  Commonly known as:  QUESTRAN  Take 4 g by mouth once.     methocarbamol 500 MG tablet  Commonly known as:  ROBAXIN  Take 1 tablet (500 mg total) by mouth every 6 (six) hours as needed for muscle spasms.     multivitamin with minerals tablet  Take 1 tablet by mouth daily.     oxyCODONE-acetaminophen 5-325 MG per tablet  Commonly known as:  ROXICET  Take 1-2 tablets by mouth every 4 (four) hours as needed for severe pain.     pantoprazole 40 MG tablet  Commonly known as:  PROTONIX  Take 1 tablet by mouth 2 (two) times daily.     Saxagliptin-Metformin 2.07-998 MG Tb24  Take 1 tablet by mouth daily.     tadalafil 5 MG tablet  Commonly known as:  CIALIS  Take 5 mg by mouth daily.        Physical Exam  BP 150/78 mmHg  Pulse 71  Temp(Src) 98 F (36.7 C)  Resp 20  Ht 6' (1.829 m)  Wt 237 lb (107.502 kg)  BMI 32.14 kg/m2  SpO2 97%  Constitutional: WDWN elderly male in no acute distress. Conversant.  HEENT: Normocephalic and atraumatic. PERRL. EOM intact. No icterus. No nasal discharge or sinus tenderness. Oral mucosa moist. Posterior pharynx clear of any exudate or lesions. Teeth and gingiva in good general condition.  Neck: Supple and nontender. No lymphadenopathy, masses, or thyromegaly. No JVD or carotid bruits. Cardiac: Normal S1, S2. RRR without appreciable murmurs, rubs, or gallops. Distal pulses intact. No dependent edema Lungs: No respiratory distress. Breath sounds clear bilaterally without rales, rhonchi, or wheezes. Abdomen: Audible bowel sounds in all quadrants. Soft, nontender, nondistended.   Musculoskeletal: able to move all  extremities. Limited ROM of LLE. Surgical dressing intact Skin: Warm and dry. No rash noted. No erythema.  Neurological: Alert and oriented to person, place, and time. No focal deficits.  Psychiatric: Judgment and insight adequate. Appropriate mood and affect.   Labs Reviewed  CBC Latest Ref Rng 04/08/2014 04/07/2014 04/06/2014  WBC 4.0 - 10.5 K/uL 3.9(L) 4.5 5.0  Hemoglobin 13.0 - 17.0 g/dL 9.7(L) 9.6(L) 10.0(L)  Hematocrit 39.0 - 52.0 % 27.7(L) 27.0(L) 28.0(L)  Platelets 150 - 400 K/uL 162 134(L) 117(L)    CMP Latest Ref Rng 04/08/2014 04/06/2014 04/05/2014  Glucose 70 - 99 mg/dL 136(H) 143(H) 147(H)  BUN 6 - 23 mg/dL 18 13 15   Creatinine 0.50 - 1.35 mg/dL 0.97 0.96 0.88  Sodium 135 - 145 mmol/L 138 134(L) 138  Potassium 3.5 - 5.1 mmol/L 3.6 3.7 3.8  Chloride 96 - 112 mEq/L 101 104 108  CO2 19 - 32 mmol/L 27 26 22   Calcium 8.4 - 10.5 mg/dL 8.7 8.7 8.4  Total Protein 6.0 - 8.3 g/dL - - -  Total Bilirubin 0.3 - 1.2 mg/dL - - -  Alkaline Phos 39 - 117 U/L - - -  AST 0 - 37 U/L - - -  ALT 0 - 53 U/L - - -    Lab Results  Component Value Date   HGBA1C 5.8* 04/04/2014    Lab Results  Component Value Date   TSH 1.120 01/12/2012    Lipid Panel     Component Value Date/Time   CHOL 89 04/04/2014 0502   TRIG 214* 04/04/2014 0502   HDL 23* 04/04/2014 0502   CHOLHDL 3.9 04/04/2014 0502   VLDL 43* 04/04/2014 0502   LDLCALC 23 04/04/2014 0502    Diagnostic Studies Reviewed 04/03/14: Xray pelvis: Proximal left  femur fracture  04/04/14: Xray Left femur: Internal fixation of intertrochanteric left hip fracture in near anatomic alignment.  Assessment & Plan 1. Intertrochanteric fracture of left hip, sequela S/p ORIF. Continue to work with PT/OT for gait/strength/balance training to restore/maximize function and 50% WBAT to LLE. Continue asa 325mg  twice daily for DVT prophylaxis. Continue percocet 5/325mg  1-2 tabs every four hours as needed for pain with robaxin 500mg  every six hours  as needed for muscle spasms. Continue to f/u with ortho.   2. Essential hypertension BP slightly elevated. Continue coreg 6.25mg  twice daily for now. Monitor VS qshift x 3 days. If BP remains elevated. Will need to adjust his BP med.   3. Type 2 diabetes mellitus without complication Continue saxagliptin/metformin 2.5/1000mg  daily. Monitor cbg daily. Continue to monitor his status  4. Gastroesophageal reflux disease, esophagitis presence not specified Continue protonix 40mg  twice daily.   5. Acute blood loss anemia Recent hbg/hct 9.7/27.7-will monitor h&h  6. s/p CABGx4 elective Denies chest pain. Continue coreg, statin, and asa.   7. HLD Continue lipitor 40mg  daily and monitor  Diagnostic Studies/Labs Ordered: cbc,bmp next lab draw.   Time: 45 minutes on care coordination   Family/Staff Communication Plan of care discussed with patient and nursing staff. Patient and nursing staff verbalized understanding and agree with plan of care. No additional questions or concerns reported.    Arthur Holms, MSN, AGNP-C [facility] [address] [place], [place] [address] [phone] [8am-5pm] After hours: [phone]

## 2014-04-10 ENCOUNTER — Non-Acute Institutional Stay (SKILLED_NURSING_FACILITY): Payer: Medicare PPO | Admitting: Registered Nurse

## 2014-04-10 ENCOUNTER — Encounter: Payer: Self-pay | Admitting: Registered Nurse

## 2014-04-10 DIAGNOSIS — D62 Acute posthemorrhagic anemia: Secondary | ICD-10-CM

## 2014-04-10 DIAGNOSIS — S72142S Displaced intertrochanteric fracture of left femur, sequela: Secondary | ICD-10-CM

## 2014-04-10 DIAGNOSIS — Z951 Presence of aortocoronary bypass graft: Secondary | ICD-10-CM

## 2014-04-10 DIAGNOSIS — I10 Essential (primary) hypertension: Secondary | ICD-10-CM

## 2014-04-10 DIAGNOSIS — E119 Type 2 diabetes mellitus without complications: Secondary | ICD-10-CM

## 2014-04-10 DIAGNOSIS — K219 Gastro-esophageal reflux disease without esophagitis: Secondary | ICD-10-CM

## 2014-04-10 DIAGNOSIS — E785 Hyperlipidemia, unspecified: Secondary | ICD-10-CM

## 2014-04-10 NOTE — Progress Notes (Signed)
Patient ID: Lucas Morales, male   DOB: 1933-12-10, 81 y.o.   MRN: [id number]   Place of Service: [facility]  Allergies  Allergen Reactions  . Morphine Itching  . Desmopressin Itching  . Losartan     Itching    Code Status: Full Code  Goals of Care: Longevity/STR  Chief Complaint  Patient presents with  . Discharge Note    HPI 81 y.o. male with PMH of HTN, interstitial cystitis, HLD, CAD s/p CABGx4, OSA, GERD, paroxysmal afib, among others is being seen for a discharge visit. He was here for short-term rehabilitation post hospital admission from 1/15/6 to 04/08/14 for closed left intertrochanteric fracture after a mechanical fall s/p ORIF by Dr. Ninfa Linden. He has worked well with therapy team and is ready to be discharged home with [facility] PT/OT and DME (FWW, 3-1). Seen in room today. No complaints verbalized by patient.  Review of Systems Constitutional: Negative for fever and chills HENT: Negative for ear pain and sore throat Eyes: Negative for eye pain and visual disturbance  Cardiovascular: Negative for chest pain, palpitations, and leg swelling Respiratory: Negative cough, shortness of breath, and wheezing.  Gastrointestinal: Negative for nausea and vomiting. Negative for abdominal pain, diarrhea and constipation.  Genitourinary: Positive for interstitial cystitis  Endocrine: Negative for polydipsia, polyphagia, and polyuria Musculoskeletal: Negative for back pain. Positive for left hip pain, but "negletiable in comparison to interstitial cystitis" Neurological: Negative for dizziness and headache Skin: Negative for rash and pruritis  Psychiatric: Negative for depression  Past Medical History  Diagnosis Date  . Adenomatous colon polyp   . Hemorrhoids   . Diverticulosis   . Gastritis   . IC (interstitial cystitis)   . Esophageal stricture   . Diverticulitis   . Hyperlipidemia     doesn't require meds  . Anginal pain 01/11/2012  . Bronchitis     "I've had  it a few times" (01/11/2012)  . Pre-diabetes     "check my sugars q 3 days" (01/11/2012)  . Gout   . Dysrhythmia     bradycardia in the 30's (01/11/2012)  . Coronary artery disease   . Hypertension     takes Losartan daily  . Diabetes mellitus without complication     "borderline"  . History of bronchitis     last time about 3-34yrs ago  . Joint pain   . Joint swelling   . Gout     takes ALlopurinol daily  . GERD (gastroesophageal reflux disease)     takes Pantoprazole daily  . History of colon polyps   . Urinary frequency   . Interstitial cystitis   . Depression     does't require any meds  . Nocturia   . Insomnia     takes Nortrypyline nightly  . Shortness of breath 01/11/2012    pt states all of the time drSOOD DID PFT RECENTLY  . Hiatal hernia   . Esophageal dysmotility   . Arthritis     "knees" (01/11/2012)  . Paroxysmal atrial fibrillation   . Hyperlipidemia   . Dyspnea on exertion   . OSA (obstructive sleep apnea) 10/21/2012    Past Surgical History  Procedure Laterality Date  . Reconstruction medial collateral ligament elbow w/ tendon graft  1988 X 2    right  . Total knee arthroplasty  ~ 2001; 2011    right; left  . Bladder lesion removed    . Exploratory laparotomy  2009    with right colectomy  .  Tonsillectomy and adenoidectomy  ~1943  . Appendectomy  2009  . Cholecystectomy  2009  . Lumbar disc surgery  ?2010    "cleaned out the stenosis" (01/11/2012)  . Cystoscopy with ureteroscopy  2008; 2009    "painted inside of bladder wall"  . Cataract extraction w/ intraocular lens  implant, bilateral  2011  . Tumor excision  12/2011    left arm  . Elbow surgery  1988    right  . Colectomy      d/t diverticulosis  . Heel spurs removed  25+yrs ago    bil   . Colonoscopy    . Esophagogastroduodenoscopy    . Coronary artery bypass graft  01/18/2012    Procedure: CORONARY ARTERY BYPASS GRAFTING (CABG);  Surgeon: Ivin Poot, MD;  Location: [place];   Service: Open Heart Surgery;  Laterality: N/A;  times four, using left internal mammary artery  . Endovein harvest of greater saphenous vein  01/18/2012    Procedure: ENDOVEIN HARVEST OF GREATER SAPHENOUS VEIN;  Surgeon: Ivin Poot, MD;  Location: [place];  Service: Open Heart Surgery;  Laterality: Right;  . Cardiac catheterization  01/11/2012  . Joint replacement  90's and 2000's    bil knee  . Pars plana vitrectomy w/ repair of macular hole Right 10/08/2012    Dr Zigmund Daniel  . 25 gauge pars plana vitrectomy with 20 gauge mvr port for macular hole Right 10/08/2012    Procedure: 25 GAUGE PARS PLANA VITRECTOMY WITH 20 GAUGE MVR PORT FOR MACULAR HOLE; Membrame peel, serum patch; Laser treatment ; Gas exchange;  Surgeon: Hayden Pedro, MD;  Location: [place];  Service: Ophthalmology;  Laterality: Right;  . Left heart catheterization with coronary angiogram N/A 01/11/2012    Procedure: LEFT HEART CATHETERIZATION WITH CORONARY ANGIOGRAM;  Surgeon: Leonie Man, MD;  Location: [facility] CATH LAB;  Service: Cardiovascular;  Laterality: N/A;  . Compression hip screw Left 04/04/2014    Procedure: COMPRESSION HIP LEFT;  Surgeon: Mcarthur Rossetti, MD;  Location: [place];  Service: Orthopedics;  Laterality: Left;    History  Substance Use Topics  . Smoking status: Never Smoker   . Smokeless tobacco: Never Used  . Alcohol Use: Yes     Comment: 09/30/2012 - has small glass of wine every other day;01/11/2012 "last alcohol > 2 yr ago; neer had problem w/it"     Family History  Problem Relation Age of Onset  . Colon cancer Neg Hx   . Kidney disease Father   . Emphysema Mother   . Emphysema Maternal Grandfather       Medication List       This list is accurate as of: 04/10/14 10:46 AM.  Always use your most recent med list.               aspirin 325 MG EC tablet  Take 1 tablet (325 mg total) by mouth 2 (two) times daily after a meal.     atorvastatin 40 MG tablet  Commonly known as:  LIPITOR   TAKE 1 TABLET BY MOUTH ONCE DAILY AT 6PM     calcium carbonate 750 MG chewable tablet  Commonly known as:  TUMS EX  Chew 3-4 tablets by mouth daily.     carvedilol 12.5 MG tablet  Commonly known as:  COREG  Take 0.5 tablets by mouth 2 (two) times daily.     cholestyramine 4 G packet  Commonly known as:  QUESTRAN  Take 4 g by  mouth once.     methocarbamol 500 MG tablet  Commonly known as:  ROBAXIN  Take 1 tablet (500 mg total) by mouth every 6 (six) hours as needed for muscle spasms.     multivitamin with minerals tablet  Take 1 tablet by mouth daily.     oxyCODONE-acetaminophen 5-325 MG per tablet  Commonly known as:  ROXICET  Take 1-2 tablets by mouth every 4 (four) hours as needed for severe pain.     pantoprazole 40 MG tablet  Commonly known as:  PROTONIX  Take 1 tablet by mouth 2 (two) times daily.     Saxagliptin-Metformin 2.07-998 MG Tb24  Take 1 tablet by mouth daily.     tadalafil 5 MG tablet  Commonly known as:  CIALIS  Take 5 mg by mouth daily.        Physical Exam  BP 112/77 mmHg  Pulse 68  Temp(Src) 98.8 F (37.1 C)  Resp 20  Ht 6' (1.829 m)  Wt 237 lb (107.502 kg)  BMI 32.14 kg/m2  Constitutional: WDWN elderly male in no acute distress. Conversant. Appears comfortable in recliner HEENT: Normocephalic and atraumatic. PERRL. EOM intact. No icterus. Oral mucosa moist. Posterior pharynx clear of any exudate or lesions.  Neck: Supple and nontender. No lymphadenopathy, masses, or thyromegaly. No JVD or carotid bruits. Cardiac: Normal S1, S2. RRR without appreciable murmurs, rubs, or gallops. Distal pulses intact. No dependent edema Lungs: No respiratory distress. Breath sounds clear bilaterally without rales, rhonchi, or wheezes. Abdomen: Audible bowel sounds in all quadrants. Soft, nontender, nondistended.   Musculoskeletal: able to move all extremities. Limited ROM of LLE. Surgical dressing intact Skin: Warm and dry. No rash noted.   Neurological:  Alert and oriented to person, place, and time. No focal deficits.  Psychiatric: Judgment and insight adequate. Appropriate mood and affect.   Labs Reviewed  CBC Latest Ref Rng 04/08/2014 04/07/2014 04/06/2014  WBC 4.0 - 10.5 K/uL 3.9(L) 4.5 5.0  Hemoglobin 13.0 - 17.0 g/dL 9.7(L) 9.6(L) 10.0(L)  Hematocrit 39.0 - 52.0 % 27.7(L) 27.0(L) 28.0(L)  Platelets 150 - 400 K/uL 162 134(L) 117(L)    CMP Latest Ref Rng 04/08/2014 04/06/2014 04/05/2014  Glucose 70 - 99 mg/dL 136(H) 143(H) 147(H)  BUN 6 - 23 mg/dL 18 13 15   Creatinine 0.50 - 1.35 mg/dL 0.97 0.96 0.88  Sodium 135 - 145 mmol/L 138 134(L) 138  Potassium 3.5 - 5.1 mmol/L 3.6 3.7 3.8  Chloride 96 - 112 mEq/L 101 104 108  CO2 19 - 32 mmol/L 27 26 22   Calcium 8.4 - 10.5 mg/dL 8.7 8.7 8.4  Total Protein 6.0 - 8.3 g/dL - - -  Total Bilirubin 0.3 - 1.2 mg/dL - - -  Alkaline Phos 39 - 117 U/L - - -  AST 0 - 37 U/L - - -  ALT 0 - 53 U/L - - -    Lab Results  Component Value Date   HGBA1C 5.8* 04/04/2014    Lab Results  Component Value Date   TSH 1.120 01/12/2012    Lipid Panel     Component Value Date/Time   CHOL 89 04/04/2014 0502   TRIG 214* 04/04/2014 0502   HDL 23* 04/04/2014 0502   CHOLHDL 3.9 04/04/2014 0502   VLDL 43* 04/04/2014 0502   LDLCALC 23 04/04/2014 0502    Diagnostic Studies Reviewed 04/03/14: Xray pelvis: Proximal left femur fracture  04/04/14: Xray Left femur: Internal fixation of intertrochanteric left hip fracture in near anatomic alignment.  Assessment &  Plan 1. Intertrochanteric fracture of left hip, sequela S/p ORIF. Continue to work with [facility] PT/OT for gait/strength/balance training to restore/maximize function and 50% WBAT to LLE. Continue asa 325mg  twice daily for DVT prophylaxis for now-aspirin dose to be adjusted by ortho after 3 week period. Continue percocet 5/325mg  1-2 tabs every four hours as needed for pain with robaxin 500mg  every six hours as needed for muscle spasms. Continue to f/u with  ortho.   2. Essential hypertension BP 112/77 today. Baseline BPs around 120-130s/60-70s. Continue coreg 6.25mg  twice daily. F/u with PCP  3. Type 2 diabetes mellitus without complication Recent I6N 5.8. Continue saxagliptin/metformin 2.5/1000mg  daily.   4. Gastroesophageal reflux disease, esophagitis presence not specified Continue protonix 40mg  twice daily.   5. Acute blood loss anemia Recent hbg/hct 9.7/27.7 -PCP to monitor h&h  6. s/p CABGx4 elective Denies chest pain. Continue coreg, statin, and asa for secondary prevention   7. HLD Continue lipitor 40mg  daily  Home health services: PT/OT DME required: [facility], 3-1 PCP follow-up: schedule f/u appt with Dr. Deland Pretty w/in 1-2 weeks of discharge from SNF 30-day supply of prescription medications provided (#60 robaxin 500mg  & #45 of percocet 5/325mg )  Time: 35 minutes on care coordination   Family/Staff Communication Plan of care discussed with patient and nursing staff. Patient and nursing staff verbalized understanding and agree with plan of care. No additional questions or concerns reported.    Arthur Holms, MSN, AGNP-C [facility] [address] [place], [place] [address] [phone] [8am-5pm] After hours: [phone]

## 2014-04-15 DIAGNOSIS — S72142D Displaced intertrochanteric fracture of left femur, subsequent encounter for closed fracture with routine healing: Secondary | ICD-10-CM

## 2014-04-15 DIAGNOSIS — I480 Paroxysmal atrial fibrillation: Secondary | ICD-10-CM

## 2014-04-15 DIAGNOSIS — I10 Essential (primary) hypertension: Secondary | ICD-10-CM

## 2014-04-15 DIAGNOSIS — E119 Type 2 diabetes mellitus without complications: Secondary | ICD-10-CM

## 2014-04-28 ENCOUNTER — Other Ambulatory Visit: Payer: Self-pay | Admitting: Cardiovascular Disease

## 2014-04-29 ENCOUNTER — Other Ambulatory Visit: Payer: Self-pay | Admitting: Internal Medicine

## 2014-04-29 NOTE — Telephone Encounter (Signed)
Rx refill sent to patient pharmacy   

## 2014-05-06 ENCOUNTER — Other Ambulatory Visit: Payer: Self-pay | Admitting: Internal Medicine

## 2014-06-01 ENCOUNTER — Encounter (INDEPENDENT_AMBULATORY_CARE_PROVIDER_SITE_OTHER): Payer: Medicare PPO | Admitting: Ophthalmology

## 2014-06-01 DIAGNOSIS — H43813 Vitreous degeneration, bilateral: Secondary | ICD-10-CM | POA: Diagnosis not present

## 2014-06-01 DIAGNOSIS — H35343 Macular cyst, hole, or pseudohole, bilateral: Secondary | ICD-10-CM

## 2014-06-01 DIAGNOSIS — H35371 Puckering of macula, right eye: Secondary | ICD-10-CM

## 2014-06-01 DIAGNOSIS — I10 Essential (primary) hypertension: Secondary | ICD-10-CM

## 2014-06-01 DIAGNOSIS — H35033 Hypertensive retinopathy, bilateral: Secondary | ICD-10-CM | POA: Diagnosis not present

## 2014-06-04 ENCOUNTER — Inpatient Hospital Stay (HOSPITAL_COMMUNITY)
Admission: EM | Admit: 2014-06-04 | Discharge: 2014-06-06 | DRG: 378 | Disposition: A | Discharge status: Home / Self Care | Payer: Medicare PPO | Attending: Internal Medicine | Admitting: Internal Medicine

## 2014-06-04 ENCOUNTER — Telehealth: Payer: Self-pay | Admitting: Internal Medicine

## 2014-06-04 ENCOUNTER — Encounter (HOSPITAL_COMMUNITY): Payer: Self-pay | Admitting: Emergency Medicine

## 2014-06-04 DIAGNOSIS — K2971 Gastritis, unspecified, with bleeding: Secondary | ICD-10-CM | POA: Diagnosis present

## 2014-06-04 DIAGNOSIS — K2970 Gastritis, unspecified, without bleeding: Secondary | ICD-10-CM | POA: Diagnosis present

## 2014-06-04 DIAGNOSIS — I2510 Atherosclerotic heart disease of native coronary artery without angina pectoris: Secondary | ICD-10-CM | POA: Diagnosis present

## 2014-06-04 DIAGNOSIS — E785 Hyperlipidemia, unspecified: Secondary | ICD-10-CM | POA: Diagnosis present

## 2014-06-04 DIAGNOSIS — D62 Acute posthemorrhagic anemia: Secondary | ICD-10-CM | POA: Diagnosis present

## 2014-06-04 DIAGNOSIS — I951 Orthostatic hypotension: Secondary | ICD-10-CM

## 2014-06-04 DIAGNOSIS — F329 Major depressive disorder, single episode, unspecified: Secondary | ICD-10-CM | POA: Diagnosis present

## 2014-06-04 DIAGNOSIS — M109 Gout, unspecified: Secondary | ICD-10-CM | POA: Diagnosis present

## 2014-06-04 DIAGNOSIS — D696 Thrombocytopenia, unspecified: Secondary | ICD-10-CM | POA: Diagnosis present

## 2014-06-04 DIAGNOSIS — K224 Dyskinesia of esophagus: Secondary | ICD-10-CM | POA: Diagnosis present

## 2014-06-04 DIAGNOSIS — Z7982 Long term (current) use of aspirin: Secondary | ICD-10-CM

## 2014-06-04 DIAGNOSIS — N3010 Interstitial cystitis (chronic) without hematuria: Secondary | ICD-10-CM | POA: Diagnosis present

## 2014-06-04 DIAGNOSIS — I480 Paroxysmal atrial fibrillation: Secondary | ICD-10-CM | POA: Diagnosis present

## 2014-06-04 DIAGNOSIS — I10 Essential (primary) hypertension: Secondary | ICD-10-CM | POA: Diagnosis present

## 2014-06-04 DIAGNOSIS — G4733 Obstructive sleep apnea (adult) (pediatric): Secondary | ICD-10-CM | POA: Diagnosis present

## 2014-06-04 DIAGNOSIS — K2991 Gastroduodenitis, unspecified, with bleeding: Secondary | ICD-10-CM | POA: Diagnosis present

## 2014-06-04 DIAGNOSIS — K449 Diaphragmatic hernia without obstruction or gangrene: Secondary | ICD-10-CM | POA: Diagnosis present

## 2014-06-04 DIAGNOSIS — Z86010 Personal history of colonic polyps: Secondary | ICD-10-CM | POA: Diagnosis not present

## 2014-06-04 DIAGNOSIS — Z951 Presence of aortocoronary bypass graft: Secondary | ICD-10-CM

## 2014-06-04 DIAGNOSIS — E119 Type 2 diabetes mellitus without complications: Secondary | ICD-10-CM | POA: Diagnosis present

## 2014-06-04 DIAGNOSIS — G4700 Insomnia, unspecified: Secondary | ICD-10-CM | POA: Diagnosis present

## 2014-06-04 DIAGNOSIS — K219 Gastro-esophageal reflux disease without esophagitis: Secondary | ICD-10-CM | POA: Diagnosis present

## 2014-06-04 DIAGNOSIS — M1990 Unspecified osteoarthritis, unspecified site: Secondary | ICD-10-CM | POA: Diagnosis present

## 2014-06-04 DIAGNOSIS — K2990 Gastroduodenitis, unspecified, without bleeding: Secondary | ICD-10-CM

## 2014-06-04 DIAGNOSIS — K274 Chronic or unspecified peptic ulcer, site unspecified, with hemorrhage: Secondary | ICD-10-CM

## 2014-06-04 DIAGNOSIS — D61818 Other pancytopenia: Secondary | ICD-10-CM | POA: Insufficient documentation

## 2014-06-04 DIAGNOSIS — K921 Melena: Principal | ICD-10-CM | POA: Diagnosis present

## 2014-06-04 DIAGNOSIS — K922 Gastrointestinal hemorrhage, unspecified: Secondary | ICD-10-CM | POA: Diagnosis present

## 2014-06-04 DIAGNOSIS — K259 Gastric ulcer, unspecified as acute or chronic, without hemorrhage or perforation: Secondary | ICD-10-CM | POA: Insufficient documentation

## 2014-06-04 LAB — CBC
HEMATOCRIT: 28.6 % — AB (ref 39.0–52.0)
HEMOGLOBIN: 9.8 g/dL — AB (ref 13.0–17.0)
MCH: 33.4 pg (ref 26.0–34.0)
MCHC: 34.3 g/dL (ref 30.0–36.0)
MCV: 97.6 fL (ref 78.0–100.0)
Platelets: 172 10*3/uL (ref 150–400)
RBC: 2.93 MIL/uL — ABNORMAL LOW (ref 4.22–5.81)
RDW: 14.8 % (ref 11.5–15.5)
WBC: 5.1 10*3/uL (ref 4.0–10.5)

## 2014-06-04 LAB — COMPREHENSIVE METABOLIC PANEL
ALT: 18 U/L (ref 0–53)
AST: 20 U/L (ref 0–37)
Albumin: 3.8 g/dL (ref 3.5–5.2)
Alkaline Phosphatase: 56 U/L (ref 39–117)
Anion gap: 7 (ref 5–15)
BUN: 54 mg/dL — ABNORMAL HIGH (ref 6–23)
CO2: 20 mmol/L (ref 19–32)
Calcium: 9 mg/dL (ref 8.4–10.5)
Chloride: 115 mmol/L — ABNORMAL HIGH (ref 96–112)
Creatinine, Ser: 0.92 mg/dL (ref 0.50–1.35)
GFR calc Af Amer: >90 mL/min (ref >90)
GFR calc non Af Amer: 79 mL/min — ABNORMAL LOW (ref >90)
Glucose, Bld: 147 mg/dL — ABNORMAL HIGH (ref 70–99)
Potassium: 3.6 mmol/L (ref 3.5–5.1)
Sodium: 142 mmol/L (ref 135–145)
Total Bilirubin: 0.7 mg/dL (ref 0.3–1.2)
Total Protein: 5.8 g/dL — ABNORMAL LOW (ref 6.0–8.3)

## 2014-06-04 LAB — PROTIME-INR
INR: 1.24 (ref 0.00–1.49)
INR: 1.23 (ref 0.00–1.49)
Prothrombin Time: 15.8 seconds — ABNORMAL HIGH (ref 11.6–15.2)
Prothrombin Time: 15.7 seconds — ABNORMAL HIGH (ref 11.6–15.2)

## 2014-06-04 LAB — CBC WITH DIFFERENTIAL/PLATELET
BASOS PCT: 0 % (ref 0–1)
Basophils Absolute: 0 10*3/uL (ref 0.0–0.1)
EOS ABS: 0 10*3/uL (ref 0.0–0.7)
Eosinophils Relative: 0 % (ref 0–5)
HCT: 30.5 % — ABNORMAL LOW (ref 39.0–52.0)
HEMOGLOBIN: 10.4 g/dL — AB (ref 13.0–17.0)
Lymphocytes Relative: 26 % (ref 12–46)
Lymphs Abs: 1.6 10*3/uL (ref 0.7–4.0)
MCH: 33 pg (ref 26.0–34.0)
MCHC: 34.1 g/dL (ref 30.0–36.0)
MCV: 96.8 fL (ref 78.0–100.0)
MONO ABS: 0.6 10*3/uL (ref 0.1–1.0)
MONOS PCT: 9 % (ref 3–12)
Neutro Abs: 4 10*3/uL (ref 1.7–7.7)
Neutrophils Relative %: 65 % (ref 43–77)
Platelets: 185 10*3/uL (ref 150–400)
RBC: 3.15 MIL/uL — AB (ref 4.22–5.81)
RDW: 14.6 % (ref 11.5–15.5)
WBC: 6.2 10*3/uL (ref 4.0–10.5)

## 2014-06-04 LAB — I-STAT TROPONIN, ED: TROPONIN I, POC: 0.03 ng/mL (ref 0.00–0.08)

## 2014-06-04 LAB — APTT: APTT: 30 s (ref 24–37)

## 2014-06-04 LAB — I-STAT CG4 LACTIC ACID, ED: Lactic Acid, Venous: 2.4 mmol/L (ref 0.5–2.0)

## 2014-06-04 LAB — TYPE AND SCREEN
ABO/RH(D): A POS
Antibody Screen: NEGATIVE

## 2014-06-04 LAB — ABO/RH: ABO/RH(D): A POS

## 2014-06-04 LAB — POC OCCULT BLOOD, ED: Fecal Occult Bld: POSITIVE — AB

## 2014-06-04 MED ORDER — SODIUM CHLORIDE 0.9 % IV SOLN
INTRAVENOUS | Status: DC
  Administered 2014-06-04: 16:00:00 via INTRAVENOUS

## 2014-06-04 MED ORDER — SODIUM CHLORIDE 0.9 % IV BOLUS (SEPSIS)
1000.0000 mL | Freq: Once | INTRAVENOUS | Status: AC
  Administered 2014-06-04: 1000 mL via INTRAVENOUS

## 2014-06-04 MED ORDER — SODIUM CHLORIDE 0.9 % IV SOLN: Freq: Once | INTRAVENOUS | Status: DC

## 2014-06-04 MED ORDER — CARVEDILOL 6.25 MG PO TABS
6.2500 mg | ORAL_TABLET | Freq: Two times a day (BID) | ORAL | Status: DC
  Administered 2014-06-04 – 2014-06-06 (×4): 6.25 mg via ORAL
  Filled 2014-06-04 (×6): qty 1

## 2014-06-04 MED ORDER — SODIUM CHLORIDE 0.9 % IV SOLN
INTRAVENOUS | Status: DC
  Administered 2014-06-04 – 2014-06-06 (×5): via INTRAVENOUS

## 2014-06-04 MED ORDER — ONDANSETRON HCL 4 MG PO TABS: 4.0000 mg | ORAL_TABLET | Freq: Four times a day (QID) | ORAL | Status: DC | PRN

## 2014-06-04 MED ORDER — ONDANSETRON HCL 4 MG/2ML IJ SOLN
4.0000 mg | Freq: Once | INTRAMUSCULAR | Status: AC
  Administered 2014-06-04: 4 mg via INTRAVENOUS
  Filled 2014-06-04: qty 2

## 2014-06-04 MED ORDER — PANTOPRAZOLE SODIUM 40 MG IV SOLR
40.0000 mg | Freq: Two times a day (BID) | INTRAVENOUS | Status: DC
  Administered 2014-06-04: 40 mg via INTRAVENOUS
  Filled 2014-06-04 (×2): qty 40

## 2014-06-04 MED ORDER — PANTOPRAZOLE SODIUM 40 MG IV SOLR
40.0000 mg | Freq: Two times a day (BID) | INTRAVENOUS | Status: DC
  Administered 2014-06-04 – 2014-06-06 (×4): 40 mg via INTRAVENOUS
  Filled 2014-06-04 (×5): qty 40

## 2014-06-04 MED ORDER — ONDANSETRON HCL 4 MG/2ML IJ SOLN
4.0000 mg | Freq: Four times a day (QID) | INTRAMUSCULAR | Status: DC | PRN
  Administered 2014-06-04 – 2014-06-05 (×3): 4 mg via INTRAVENOUS
  Filled 2014-06-04 (×3): qty 2

## 2014-06-04 MED ORDER — ONDANSETRON HCL 4 MG/2ML IJ SOLN: 4.0000 mg | Freq: Three times a day (TID) | INTRAMUSCULAR | Status: DC | PRN

## 2014-06-04 MED ORDER — SODIUM CHLORIDE 0.9 % IJ SOLN
3.0000 mL | Freq: Two times a day (BID) | INTRAMUSCULAR | Status: DC
  Administered 2014-06-06: 3 mL via INTRAVENOUS

## 2014-06-04 NOTE — Telephone Encounter (Signed)
Spoke with patient's wife and she states EMS are at the home now. She states "We waited until he was ready to go."

## 2014-06-04 NOTE — Progress Notes (Signed)
Got called by ER about this patient.  Being admitted to hospitalist service.  Hemodynamically stable.  Will place on pantoprazole 40 mg IV BID and will make NPO after midnight for possible EGD 3/18.  Will see in formal consult in AM.

## 2014-06-04 NOTE — Telephone Encounter (Signed)
If nobody called him back, please cll and have  CBC Stat,

## 2014-06-04 NOTE — Telephone Encounter (Signed)
Dr. Nichola Sizer patient

## 2014-06-04 NOTE — ED Provider Notes (Signed)
CSN: [id number]     Arrival date & time 06/04/14  1511 History   First MD Initiated Contact with Patient 06/04/14 1523     Chief Complaint  Patient presents with  . Rectal Bleeding  . Hematemesis     (Consider location/radiation/quality/duration/timing/severity/associated sxs/prior Treatment) Patient is a 81 y.o. male presenting with hematochezia. The history is provided by the patient and the spouse.  Rectal Bleeding Quality:  Black and tarry Amount:  Copious Duration:  2 days Timing:  Constant Progression:  Improving (yesterday was having black diarrhea every 45 min yesterday but today last episode was 11) Chronicity:  New Context: diarrhea and spontaneously   Context comment:  Dark vomitus since ysterday but only nausea today.  4y/o grandson with n/v/d this week Similar prior episodes: no   Relieved by:  Nothing Worsened by:  Nothing tried Ineffective treatments:  None tried Associated symptoms: vomiting   Associated symptoms: no abdominal pain, no dizziness, no epistaxis, no fever, no hematemesis and no loss of consciousness   Associated symptoms comment:  No chest pain,no new SOB Risk factors: hx of colorectal cancer and hx of colorectal surgery   Risk factors comment:  Full strength aspirin   Past Medical History  Diagnosis Date  . Adenomatous colon polyp   . Hemorrhoids   . Diverticulosis   . Gastritis   . IC (interstitial cystitis)   . Esophageal stricture   . Diverticulitis   . Hyperlipidemia     doesn't require meds  . Anginal pain 01/11/2012  . Bronchitis     "I've had it a few times" (01/11/2012)  . Pre-diabetes     "check my sugars q 3 days" (01/11/2012)  . Gout   . Dysrhythmia     bradycardia in the 30's (01/11/2012)  . Coronary artery disease   . Hypertension     takes Losartan daily  . Diabetes mellitus without complication     "borderline"  . History of bronchitis     last time about 3-63yrs ago  . Joint pain   . Joint swelling   . Gout      takes ALlopurinol daily  . GERD (gastroesophageal reflux disease)     takes Pantoprazole daily  . History of colon polyps   . Urinary frequency   . Interstitial cystitis   . Depression     does't require any meds  . Nocturia   . Insomnia     takes Nortrypyline nightly  . Shortness of breath 01/11/2012    pt states all of the time drSOOD DID PFT RECENTLY  . Hiatal hernia   . Esophageal dysmotility   . Arthritis     "knees" (01/11/2012)  . Paroxysmal atrial fibrillation   . Hyperlipidemia   . Dyspnea on exertion   . OSA (obstructive sleep apnea) 10/21/2012   Past Surgical History  Procedure Laterality Date  . Reconstruction medial collateral ligament elbow w/ tendon graft  1988 X 2    right  . Total knee arthroplasty  ~ 2001; 2011    right; left  . Bladder lesion removed    . Exploratory laparotomy  2009    with right colectomy  . Tonsillectomy and adenoidectomy  ~1943  . Appendectomy  2009  . Cholecystectomy  2009  . Lumbar disc surgery  ?2010    "cleaned out the stenosis" (01/11/2012)  . Cystoscopy with ureteroscopy  2008; 2009    "painted inside of bladder wall"  . Cataract extraction w/ intraocular lens  implant,  bilateral  2011  . Tumor excision  12/2011    left arm  . Elbow surgery  1988    right  . Colectomy      d/t diverticulosis  . Heel spurs removed  25+yrs ago    bil   . Colonoscopy    . Esophagogastroduodenoscopy    . Coronary artery bypass graft  01/18/2012    Procedure: CORONARY ARTERY BYPASS GRAFTING (CABG);  Surgeon: Ivin Poot, MD;  Location: [place];  Service: Open Heart Surgery;  Laterality: N/A;  times four, using left internal mammary artery  . Endovein harvest of greater saphenous vein  01/18/2012    Procedure: ENDOVEIN HARVEST OF GREATER SAPHENOUS VEIN;  Surgeon: Ivin Poot, MD;  Location: [place];  Service: Open Heart Surgery;  Laterality: Right;  . Cardiac catheterization  01/11/2012  . Joint replacement  90's and 2000's    bil knee   . Pars plana vitrectomy w/ repair of macular hole Right 10/08/2012    Dr Zigmund Daniel  . 25 gauge pars plana vitrectomy with 20 gauge mvr port for macular hole Right 10/08/2012    Procedure: 25 GAUGE PARS PLANA VITRECTOMY WITH 20 GAUGE MVR PORT FOR MACULAR HOLE; Membrame peel, serum patch; Laser treatment ; Gas exchange;  Surgeon: Hayden Pedro, MD;  Location: [place];  Service: Ophthalmology;  Laterality: Right;  . Left heart catheterization with coronary angiogram N/A 01/11/2012    Procedure: LEFT HEART CATHETERIZATION WITH CORONARY ANGIOGRAM;  Surgeon: Leonie Man, MD;  Location: [facility] C[facility];  Service: Cardiovascular;  Laterality: N/A;  . Compression hip screw Left 04/04/2014    Procedure: COMPRESSION HIP LEFT;  Surgeon: Mcarthur Rossetti, MD;  Location: [place];  Service: Orthopedics;  Laterality: Left;   Family History  Problem Relation Age of Onset  . Colon cancer Neg Hx   . Kidney disease Father   . Emphysema Mother   . Emphysema Maternal Grandfather    History  Substance Use Topics  . Smoking status: Never Smoker   . Smokeless tobacco: Never Used  . Alcohol Use: Yes     Comment: 09/30/2012 - has small glass of wine every other day;01/11/2012 "last alcohol > 2 yr ago; neer had problem w/it"     Review of Systems  Constitutional: Negative for fever.  HENT: Negative for nosebleeds.   Gastrointestinal: Positive for vomiting and hematochezia. Negative for abdominal pain and hematemesis.  Neurological: Negative for dizziness and loss of consciousness.  All other systems reviewed and are negative.     Allergies  Morphine  Home Medications   Prior to Admission medications   Medication Sig Start Date End Date Taking? Authorizing Provider  atorvastatin (LIPITOR) 40 MG tablet Take 1 tablet (40 mg total) by mouth daily at 6 PM. *MUST MAKE APPOINTMENT* 04/29/14  Yes Minus Breeding, MD  calcium carbonate (TUMS EX) 750 MG chewable tablet Chew 3-4 tablets by mouth daily.    Yes  Historical Provider, MD  carvedilol (COREG) 12.5 MG tablet Take 0.5 tablets by mouth 2 (two) times daily.   Yes Historical Provider, MD  cholestyramine Lucrezia Starch) 4 G packet Take 4 g by mouth daily as needed (with 4 ounces of juice.).  04/01/14  Yes Historical Provider, MD  FIBER SELECT GUMMIES PO Take 2 tablets by mouth daily.   Yes Historical Provider, MD  Multiple Vitamins-Minerals (MULTIVITAMIN WITH MINERALS) tablet Take 1 tablet by mouth daily.     Yes Historical Provider, MD  oxyCODONE (OXY IR/ROXICODONE) 5  MG immediate release tablet Take 10 mg by mouth every 4 (four) hours as needed for severe pain.   Yes Historical Provider, MD  pantoprazole (PROTONIX) 40 MG tablet Take 1 tablet by mouth 2 (two) times daily.   Yes Historical Provider, MD  Saxagliptin-Metformin 2.07-998 MG TB24 Take 1 tablet by mouth daily.    Yes Historical Provider, MD  tadalafil (CIALIS) 5 MG tablet Take 5 mg by mouth daily. For interstitial cystitis.   Yes Historical Provider, MD  aspirin 325 MG EC tablet Take 1 tablet (325 mg total) by mouth 2 (two) times daily after a meal. Patient taking differently: Take 325 mg by mouth daily.  04/07/14   Mcarthur Rossetti, MD  methocarbamol (ROBAXIN) 500 MG tablet Take 1 tablet (500 mg total) by mouth every 6 (six) hours as needed for muscle spasms. Patient not taking: Reported on 06/04/2014 04/07/14   Mcarthur Rossetti, MD  oxyCODONE-acetaminophen (ROXICET) 5-325 MG per tablet Take 1-2 tablets by mouth every 4 (four) hours as needed for severe pain. Patient not taking: Reported on 06/04/2014 04/07/14   Mcarthur Rossetti, MD   BP 127/69 mmHg  Pulse 86  Temp(Src) 98.7 F (37.1 C) (Oral)  Resp 20  SpO2 95% Physical Exam  Constitutional: He is oriented to person, place, and time. He appears well-developed and well-nourished. No distress.  HENT:  Head: Normocephalic and atraumatic.  Mouth/Throat: Oropharynx is clear and moist.  Eyes: Conjunctivae and EOM are normal.  Pupils are equal, round, and reactive to light.  Neck: Normal range of motion. Neck supple.  Cardiovascular: Normal rate, regular rhythm and intact distal pulses.   No murmur heard. Pulmonary/Chest: Effort normal and breath sounds normal. No respiratory distress. He has no wheezes. He has no rales.  Abdominal: Soft. He exhibits no distension. There is no tenderness. There is no rebound and no guarding.  Genitourinary: Guaiac positive stool.  Black tarry thick stool  Musculoskeletal: Normal range of motion. He exhibits no edema or tenderness.  Neurological: He is alert and oriented to person, place, and time.  Skin: Skin is warm and dry. No rash noted. No erythema. There is pallor.  Psychiatric: He has a normal mood and affect. His behavior is normal.  Nursing note and vitals reviewed.   ED Course  Procedures (including critical care time) Labs Review Labs Reviewed  CBC WITH DIFFERENTIAL/PLATELET - Abnormal; Notable for the following:    RBC 3.15 (*)    Hemoglobin 10.4 (*)    HCT 30.5 (*)    All other components within normal limits  COMPREHENSIVE METABOLIC PANEL - Abnormal; Notable for the following:    Chloride 115 (*)    Glucose, Bld 147 (*)    BUN 54 (*)    Total Protein 5.8 (*)    GFR calc non Af Amer 79 (*)    All other components within normal limits  PROTIME-INR - Abnormal; Notable for the following:    Prothrombin Time 15.8 (*)    All other components within normal limits  POC OCCULT BLOOD, ED - Abnormal; Notable for the following:    Fecal Occult Bld POSITIVE (*)    All other components within normal limits  I-STAT CG4 LACTIC ACID, ED - Abnormal; Notable for the following:    Lactic Acid, Venous 2.40 (*)    All other components within normal limits  APTT  I-STAT TROPOININ, ED  TYPE AND SCREEN    Imaging Review No results found.   EKG Interpretation  Date/Time:  Thursday June 04 2014 15:15:20 EDT Ventricular Rate:  89 PR Interval:  167 QRS Duration:  86 QT Interval:  375 QTC Calculation: 456 R Axis:   7 Text Interpretation:  Sinus rhythm Ventricular premature complex new  Abnormal T, consider ischemia, diffuse leads Confirmed by Maryan Rued  MD,  Loree Fee ([id number]) on 06/04/2014 3:26:02 PM      MDM   Final diagnoses:  None    Pt here with lower GI bleeding for the last 2 days.  Nausea and vomitting yesterday and nausea today.  Denies abd pain, chest pain or SOB.  NO bright red blood.  Vomitus was dark and stool black for 2 days.  Pt denies blood thinners other than full strength aspirin.  HD stable.  Hemoccult positive here.  CBC, CMP, PTT/INR, lactate, type and screen, troponin pending.  Pt did have new t-wave inversion.  5:14 PM Currently CBC with a stable hemoglobin of 10, CMP with elevated BUN but otherwise unremarkable. Lactate elevated at 2.4. PT/INR normal. Spoke with Janett Billow with GI who will come and see the patient. Will admit for further care.  Blanchie Dessert, MD 06/04/14 1714

## 2014-06-04 NOTE — ED Notes (Signed)
Brought in by EMS from home with c/o hematemesis and rectal bleeding.  Pt reported that he started hving stool that is "tar and really black" and also vomiting blood.  Pt's symptoms persist and called EMS.  Pt denies abdominal pain but with nausea, vomiting and diarrhea.  Has hx of diverticulitis and polyps and partial colectomy.

## 2014-06-04 NOTE — Telephone Encounter (Signed)
Spoke with patient's wife and she states patient started yesterday with nausea and vomiting. States he vomited x 5 which is now black in color. He has now started having black diarrhea. He has not taken Pepto bismol. She states the patient had hip surgery recently and is weak from that. She is going to call EMS now and take to ED.

## 2014-06-04 NOTE — ED Notes (Signed)
Bed: XH74 Expected date:  Expected time:  Means of arrival:  Comments: GI bleed

## 2014-06-04 NOTE — H&P (Signed)
[facility] History and Physical  Lucas Morales AOZ:[id number] DOB: 1933-07-31 DOA: 06/04/2014  Referring physician: Dr. Maryan Rued PCP: Jackelyn Knife, MD   Chief Complaint: Mlena and hematemesis  HPI: Lucas Morales is a 81 y.o. male with past medical history of a recent hip fracture status post replacement on 04/08/2014, and a history of gastritis, coronary artery sees and CABG in 2013, history of paroxysmal A. fib that comes in for melanotic stools and hematemesis the started the day prior to admission. He relates he started having black stools which have progressively gotten worse, one day prior to admission he started having hematemesis what seems like ground coffee as per patient has not been able to tolerate anything by mouth. He relates he gets frequent diarrhea but that is chronic. He denies any NSAIDs use except for aspirin 325 which he has been on for a number of years. He relates some dizziness upon standing no chest pain or shortness of breath.  In the ED: Vitals are stable with a heart rate of 86 blood pressure 962/95, basic metabolic panel was done that shows an elevated BUNs of 54 and a creatinine of 0.9 his lactic acid is 2.4 CBC was done that shows a hemoglobin of 10.4 at baseline cerebral consulted for further evaluation.  Review of Systems:  Constitutional:  No weight loss, night sweats, Fevers, chills, fatigue.  HEENT:  No headaches, Difficulty swallowing,Tooth/dental problems,Sore throat,  No sneezing, itching, ear ache, nasal congestion, post nasal drip,  Cardio-vascular:  No chest pain, Orthopnea, PND, swelling in lower extremities, anasarca, dizziness, palpitations  GI:  No heartburn, indigestion, abdominal pain, nausea, vomiting, diarrhea, change in bowel habits, loss of appetite  Resp:  No shortness of breath with exertion or at rest. No excess mucus, no productive cough, No non-productive cough, No coughing up of blood.No change in color of  mucus.No wheezing.No chest wall deformity  Skin:  no rash or lesions.  GU:  no dysuria, change in color of urine, no urgency or frequency. No flank pain.  Musculoskeletal:  No joint pain or swelling. No decreased range of motion. No back pain.  Psych:  No change in mood or affect. No depression or anxiety. No memory loss.   Past Medical History  Diagnosis Date  . Adenomatous colon polyp   . Hemorrhoids   . Diverticulosis   . Gastritis   . IC (interstitial cystitis)   . Esophageal stricture   . Diverticulitis   . Hyperlipidemia     doesn't require meds  . Anginal pain 01/11/2012  . Bronchitis     "I've had it a few times" (01/11/2012)  . Pre-diabetes     "check my sugars q 3 days" (01/11/2012)  . Gout   . Dysrhythmia     bradycardia in the 30's (01/11/2012)  . Coronary artery disease   . Hypertension     takes Losartan daily  . Diabetes mellitus without complication     "borderline"  . History of bronchitis     last time about 3-48yrs ago  . Joint pain   . Joint swelling   . Gout     takes ALlopurinol daily  . GERD (gastroesophageal reflux disease)     takes Pantoprazole daily  . History of colon polyps   . Urinary frequency   . Interstitial cystitis   . Depression     does't require any meds  . Nocturia   . Insomnia     takes Nortrypyline nightly  . Shortness of  breath 01/11/2012    pt states all of the time drSOOD DID PFT RECENTLY  . Hiatal hernia   . Esophageal dysmotility   . Arthritis     "knees" (01/11/2012)  . Paroxysmal atrial fibrillation   . Hyperlipidemia   . Dyspnea on exertion   . OSA (obstructive sleep apnea) 10/21/2012   Past Surgical History  Procedure Laterality Date  . Reconstruction medial collateral ligament elbow w/ tendon graft  1988 X 2    right  . Total knee arthroplasty  ~ 2001; 2011    right; left  . Bladder lesion removed    . Exploratory laparotomy  2009    with right colectomy  . Tonsillectomy and adenoidectomy  ~1943  .  Appendectomy  2009  . Cholecystectomy  2009  . Lumbar disc surgery  ?2010    "cleaned out the stenosis" (01/11/2012)  . Cystoscopy with ureteroscopy  2008; 2009    "painted inside of bladder wall"  . Cataract extraction w/ intraocular lens  implant, bilateral  2011  . Tumor excision  12/2011    left arm  . Elbow surgery  1988    right  . Colectomy      d/t diverticulosis  . Heel spurs removed  25+yrs ago    bil   . Colonoscopy    . Esophagogastroduodenoscopy    . Coronary artery bypass graft  01/18/2012    Procedure: CORONARY ARTERY BYPASS GRAFTING (CABG);  Surgeon: Ivin Poot, MD;  Location: [place];  Service: Open Heart Surgery;  Laterality: N/A;  times four, using left internal mammary artery  . Endovein harvest of greater saphenous vein  01/18/2012    Procedure: ENDOVEIN HARVEST OF GREATER SAPHENOUS VEIN;  Surgeon: Ivin Poot, MD;  Location: [place];  Service: Open Heart Surgery;  Laterality: Right;  . Cardiac catheterization  01/11/2012  . Joint replacement  90's and 2000's    bil knee  . Pars plana vitrectomy w/ repair of macular hole Right 10/08/2012    Dr Zigmund Daniel  . 25 gauge pars plana vitrectomy with 20 gauge mvr port for macular hole Right 10/08/2012    Procedure: 25 GAUGE PARS PLANA VITRECTOMY WITH 20 GAUGE MVR PORT FOR MACULAR HOLE; Membrame peel, serum patch; Laser treatment ; Gas exchange;  Surgeon: Hayden Pedro, MD;  Location: [place];  Service: Ophthalmology;  Laterality: Right;  . Left heart catheterization with coronary angiogram N/A 01/11/2012    Procedure: LEFT HEART CATHETERIZATION WITH CORONARY ANGIOGRAM;  Surgeon: Leonie Man, MD;  Location: [facility] CATH LAB;  Service: Cardiovascular;  Laterality: N/A;  . Compression hip screw Left 04/04/2014    Procedure: COMPRESSION HIP LEFT;  Surgeon: Mcarthur Rossetti, MD;  Location: [place];  Service: Orthopedics;  Laterality: Left;   Social History:  reports that he has never smoked. He has never used smokeless  tobacco. He reports that he drinks alcohol. He reports that he does not use illicit drugs.  Allergies  Allergen Reactions  . Morphine Itching    Family History  Problem Relation Age of Onset  . Colon cancer Neg Hx   . Kidney disease Father   . Emphysema Mother   . Emphysema Maternal Grandfather     Prior to Admission medications   Medication Sig Start Date End Date Taking? Authorizing Provider  atorvastatin (LIPITOR) 40 MG tablet Take 1 tablet (40 mg total) by mouth daily at 6 PM. *MUST MAKE APPOINTMENT* 04/29/14  Yes Minus Breeding, MD  calcium carbonate (  TUMS EX) 750 MG chewable tablet Chew 3-4 tablets by mouth daily.    Yes Historical Provider, MD  carvedilol (COREG) 12.5 MG tablet Take 0.5 tablets by mouth 2 (two) times daily.   Yes Historical Provider, MD  cholestyramine Lucrezia Starch) 4 G packet Take 4 g by mouth daily as needed (with 4 ounces of juice.).  04/01/14  Yes Historical Provider, MD  FIBER SELECT GUMMIES PO Take 2 tablets by mouth daily.   Yes Historical Provider, MD  Multiple Vitamins-Minerals (MULTIVITAMIN WITH MINERALS) tablet Take 1 tablet by mouth daily.     Yes Historical Provider, MD  oxyCODONE (OXY IR/ROXICODONE) 5 MG immediate release tablet Take 10 mg by mouth every 4 (four) hours as needed for severe pain.   Yes Historical Provider, MD  pantoprazole (PROTONIX) 40 MG tablet Take 1 tablet by mouth 2 (two) times daily.   Yes Historical Provider, MD  Saxagliptin-Metformin 2.07-998 MG TB24 Take 1 tablet by mouth daily.    Yes Historical Provider, MD  tadalafil (CIALIS) 5 MG tablet Take 5 mg by mouth daily. For interstitial cystitis.   Yes Historical Provider, MD  aspirin 325 MG EC tablet Take 1 tablet (325 mg total) by mouth 2 (two) times daily after a meal. Patient taking differently: Take 325 mg by mouth daily.  04/07/14   Mcarthur Rossetti, MD  methocarbamol (ROBAXIN) 500 MG tablet Take 1 tablet (500 mg total) by mouth every 6 (six) hours as needed for muscle  spasms. Patient not taking: Reported on 06/04/2014 04/07/14   Mcarthur Rossetti, MD  oxyCODONE-acetaminophen (ROXICET) 5-325 MG per tablet Take 1-2 tablets by mouth every 4 (four) hours as needed for severe pain. Patient not taking: Reported on 06/04/2014 04/07/14   Mcarthur Rossetti, MD   Physical Exam: Filed Vitals:   06/04/14 1511 06/04/14 1606  BP: 127/69   Pulse: 86   Temp: 98.7 F (37.1 C)   TempSrc: Oral   Resp: 20   Height:  6' 2.5" (1.892 m)  Weight:  103.42 kg (228 lb)  SpO2: 95%     Wt Readings from Last 3 Encounters:  06/04/14 103.42 kg (228 lb)  04/10/14 107.502 kg (237 lb)  04/09/14 107.502 kg (237 lb)    General:  Appears calm and comfortable Eyes: PERRL, normal lids, irises & conjunctiva ENT: grossly normal hearing, lips & tongue Neck: no LAD, masses or thyromegaly Cardiovascular: RRR, no m/r/g. No LE edema. Telemetry: SR, no arrhythmias  Respiratory: CTA bilaterally, no w/r/r. Normal respiratory effort. Abdomen: soft, ntnd, no rebound Skin: no rash or induration seen on limited exam Musculoskeletal: grossly normal tone BUE/BLE Psychiatric: grossly normal mood and affect, speech fluent and appropriate Neurologic: grossly non-focal.          Labs on Admission:  Basic Metabolic Panel:  Recent Labs Lab 06/04/14 1559  NA 142  K 3.6  CL 115*  CO2 20  GLUCOSE 147*  BUN 54*  CREATININE 0.92  CALCIUM 9.0   Liver Function Tests:  Recent Labs Lab 06/04/14 1559  AST 20  ALT 18  ALKPHOS 56  BILITOT 0.7  PROT 5.8*  ALBUMIN 3.8   No results for input(s): LIPASE, AMYLASE in the last 168 hours. No results for input(s): AMMONIA in the last 168 hours. CBC:  Recent Labs Lab 06/04/14 1559  WBC 6.2  NEUTROABS 4.0  HGB 10.4*  HCT 30.5*  MCV 96.8  PLT 185   Cardiac Enzymes: No results for input(s): CKTOTAL, CKMB, CKMBINDEX, TROPONINI in the  last 168 hours.  BNP (last 3 results) No results for input(s): BNP in the last 8760  hours.  ProBNP (last 3 results) No results for input(s): PROBNP in the last 8760 hours.  CBG: No results for input(s): GLUCAP in the last 168 hours.  Radiological Exams on Admission: No results found.  EKG: Independently reviewed. Sinus rhythm normal axis.  Assessment/Plan Acute blood loss anemia/Acute GI bleeding - He does have a history of gastritis and duodenitis and he is on aspirin, his hemoglobin is unchanged from previous labs but I think he is hemoconcentrated. I will go and given a liter of normal saline continue IV fluids type and screen him and recheck a CBC in the morning. Hold aspirin. - Place 2 IV lines monitor check eyes and nose. And nothing by mouth after midnight. - Remember tonics IV a have already consulted GI for possible endoscopy in the morning. His BUN/creatinine ratio is more than 40-1.  Essential  Hypertension - Hold all his blood pressure medication continue to monitor.  Orthostatic hypotension: - He relates dizziness upon standing and it does correlate with his ongoing nausea and vomiting. A voiding give him IV fluids, check urinary sodium and a urinary creatinine hold his antihypertensive medications. SPU and is elevated.  Intractable nausea and vomiting: - Start him on Zofran and Phenergan. - I will already consulted GI for possible endoscopy Dr. Maurene Capes.  Code Status: full DVT Prophylaxis:SCD Family Communication: daughter Disposition Plan: inpatient  Time spent: 1 minutes  Charlynne Cousins [facility] Pager [phone]

## 2014-06-05 ENCOUNTER — Encounter (HOSPITAL_COMMUNITY)
Admission: EM | Disposition: A | Discharge status: Home / Self Care | Payer: Self-pay | Source: Home / Self Care | Attending: Internal Medicine

## 2014-06-05 ENCOUNTER — Encounter (HOSPITAL_COMMUNITY): Payer: Self-pay

## 2014-06-05 DIAGNOSIS — K449 Diaphragmatic hernia without obstruction or gangrene: Secondary | ICD-10-CM

## 2014-06-05 DIAGNOSIS — K920 Hematemesis: Secondary | ICD-10-CM

## 2014-06-05 DIAGNOSIS — K921 Melena: Principal | ICD-10-CM

## 2014-06-05 DIAGNOSIS — K2990 Gastroduodenitis, unspecified, without bleeding: Secondary | ICD-10-CM

## 2014-06-05 DIAGNOSIS — R110 Nausea: Secondary | ICD-10-CM

## 2014-06-05 DIAGNOSIS — K2970 Gastritis, unspecified, without bleeding: Secondary | ICD-10-CM

## 2014-06-05 DIAGNOSIS — K259 Gastric ulcer, unspecified as acute or chronic, without hemorrhage or perforation: Secondary | ICD-10-CM | POA: Insufficient documentation

## 2014-06-05 HISTORY — PX: ESOPHAGOGASTRODUODENOSCOPY: SHX5428

## 2014-06-05 LAB — CBC
HEMATOCRIT: 28.3 % — AB (ref 39.0–52.0)
HEMATOCRIT: 24.4 % — AB (ref 39.0–52.0)
HEMOGLOBIN: 8.4 g/dL — AB (ref 13.0–17.0)
Hemoglobin: 9.5 g/dL — ABNORMAL LOW (ref 13.0–17.0)
MCH: 33.3 pg (ref 26.0–34.0)
MCH: 34 pg (ref 26.0–34.0)
MCHC: 33.6 g/dL (ref 30.0–36.0)
MCHC: 34.4 g/dL (ref 30.0–36.0)
MCV: 99.3 fL (ref 78.0–100.0)
MCV: 98.8 fL (ref 78.0–100.0)
Platelets: 166 10*3/uL (ref 150–400)
Platelets: 130 10*3/uL — ABNORMAL LOW (ref 150–400)
RBC: 2.85 MIL/uL — ABNORMAL LOW (ref 4.22–5.81)
RBC: 2.47 MIL/uL — ABNORMAL LOW (ref 4.22–5.81)
RDW: 15 % (ref 11.5–15.5)
RDW: 15.1 % (ref 11.5–15.5)
WBC: 5.9 10*3/uL (ref 4.0–10.5)
WBC: 3.7 10*3/uL — AB (ref 4.0–10.5)

## 2014-06-05 LAB — SODIUM, URINE, RANDOM: Sodium, Ur: 122 mEq/L

## 2014-06-05 LAB — CREATININE, URINE, RANDOM: Creatinine, Urine: 104.3 mg/dL

## 2014-06-05 SURGERY — EGD (ESOPHAGOGASTRODUODENOSCOPY)
Anesthesia: Moderate Sedation

## 2014-06-05 MED ORDER — MIDAZOLAM HCL 10 MG/2ML IJ SOLN
INTRAMUSCULAR | Status: AC
  Filled 2014-06-05: qty 2

## 2014-06-05 MED ORDER — SODIUM CHLORIDE 0.9 % IV SOLN
INTRAVENOUS | Status: DC
  Administered 2014-06-05: 500 mL via INTRAVENOUS

## 2014-06-05 MED ORDER — FENTANYL CITRATE 0.05 MG/ML IJ SOLN
INTRAMUSCULAR | Status: AC
  Filled 2014-06-05: qty 2

## 2014-06-05 MED ORDER — FENTANYL CITRATE 0.05 MG/ML IJ SOLN
INTRAMUSCULAR | Status: DC | PRN
  Administered 2014-06-05 (×2): 25 ug via INTRAVENOUS

## 2014-06-05 MED ORDER — BUTAMBEN-TETRACAINE-BENZOCAINE 2-2-14 % EX AERO
INHALATION_SPRAY | CUTANEOUS | Status: DC | PRN
  Administered 2014-06-05: 2 via TOPICAL

## 2014-06-05 MED ORDER — MIDAZOLAM HCL 10 MG/2ML IJ SOLN
INTRAMUSCULAR | Status: DC | PRN
Start: 2014-06-05 — End: 2014-06-05
  Administered 2014-06-05 (×2): 2 mg via INTRAVENOUS

## 2014-06-05 NOTE — Progress Notes (Signed)
UR complete 

## 2014-06-05 NOTE — Consult Note (Signed)
Referring Provider: No ref. provider found Primary Care Physician:  Jackelyn Knife, MD Primary Gastroenterologist:  Dr. Olevia Perches  Reason for Consultation:  Suspected UGIB  HPI: Lucas Morales is a 81 y.o. male with past medical history of a recent hip fracture status post replacement on 04/08/2014, coronary artery disease with CABG in 2013, history of paroxysmal A. Fib, and history of right colectomy (due to large transverse colon polyp in 01/2005).  He presented to [facility] hospital on 3/17 with complaints of black stools and dark emesis that started the day prior to admission. He relates he started having black stools but thought he had a virus since his 10 year old grandson was sick recently.  He also had some vomiting that was very dark like ground coffee as per patient.  He denies any NSAIDs use except for aspirin 325, which he has been on for a number of years; no anticoagulants.  In the ED: Vitals were stable with a heart rate of 86, blood pressure 127/69, elevated BUN of 54 and a creatinine of 0.9 his lactic acid was 2.4.  CBC showed ahemoglobin of 10.4 grams compared to 9.7 grams once month ago.  He was started on pantoprazole 40 mg IV BID and placed NPO.  This morning Hgb is just down slightly at 9.5 grams, which is likely somewhat dilutional; still near baseline.  No black stools or vomiting since admission.  Does endorse some nausea.  No abdominal pain.  EGD 10/2005 with hiatal hernia and esophageal stricture that was dilated to 16 mm.  GE junction biopsies showed benign esophageal tissue.  Colonoscopy 07/2012 by Dr. Olevia Perches showed moderate diverticulosis throughout the entire colon.  One polyp removed from the sigmoid colon that was a tubular adenoma.  S/p right hemicolectomy.   Past Medical History  Diagnosis Date  . Adenomatous colon polyp   . Hemorrhoids   . Diverticulosis   . Gastritis   . IC (interstitial cystitis)   . Esophageal stricture   . Diverticulitis   .  Hyperlipidemia     doesn't require meds  . Anginal pain 01/11/2012  . Bronchitis     "I've had it a few times" (01/11/2012)  . Pre-diabetes     "check my sugars q 3 days" (01/11/2012)  . Gout   . Dysrhythmia     bradycardia in the 30's (01/11/2012)  . Coronary artery disease   . Hypertension     takes Losartan daily  . Diabetes mellitus without complication     "borderline"  . History of bronchitis     last time about 3-68yrs ago  . Joint pain   . Joint swelling   . Gout     takes ALlopurinol daily  . GERD (gastroesophageal reflux disease)     takes Pantoprazole daily  . History of colon polyps   . Urinary frequency   . Interstitial cystitis   . Depression     does't require any meds  . Nocturia   . Insomnia     takes Nortrypyline nightly  . Shortness of breath 01/11/2012    pt states all of the time drSOOD DID PFT RECENTLY  . Hiatal hernia   . Esophageal dysmotility   . Arthritis     "knees" (01/11/2012)  . Paroxysmal atrial fibrillation   . Hyperlipidemia   . Dyspnea on exertion   . OSA (obstructive sleep apnea) 10/21/2012    Past Surgical History  Procedure Laterality Date  . Reconstruction medial collateral ligament elbow w/  tendon graft  1988 X 2    right  . Total knee arthroplasty  ~ 2001; 2011    right; left  . Bladder lesion removed    . Exploratory laparotomy  2009    with right colectomy  . Tonsillectomy and adenoidectomy  ~1943  . Appendectomy  2009  . Cholecystectomy  2009  . Lumbar disc surgery  ?2010    "cleaned out the stenosis" (01/11/2012)  . Cystoscopy with ureteroscopy  2008; 2009    "painted inside of bladder wall"  . Cataract extraction w/ intraocular lens  implant, bilateral  2011  . Tumor excision  12/2011    left arm  . Elbow surgery  1988    right  . Colectomy      d/t diverticulosis  . Heel spurs removed  25+yrs ago    bil   . Colonoscopy    . Esophagogastroduodenoscopy    . Coronary artery bypass graft  01/18/2012     Procedure: CORONARY ARTERY BYPASS GRAFTING (CABG);  Surgeon: Ivin Poot, MD;  Location: [place];  Service: Open Heart Surgery;  Laterality: N/A;  times four, using left internal mammary artery  . Endovein harvest of greater saphenous vein  01/18/2012    Procedure: ENDOVEIN HARVEST OF GREATER SAPHENOUS VEIN;  Surgeon: Ivin Poot, MD;  Location: [place];  Service: Open Heart Surgery;  Laterality: Right;  . Cardiac catheterization  01/11/2012  . Joint replacement  90's and 2000's    bil knee  . Pars plana vitrectomy w/ repair of macular hole Right 10/08/2012    Dr Zigmund Daniel  . 25 gauge pars plana vitrectomy with 20 gauge mvr port for macular hole Right 10/08/2012    Procedure: 25 GAUGE PARS PLANA VITRECTOMY WITH 20 GAUGE MVR PORT FOR MACULAR HOLE; Membrame peel, serum patch; Laser treatment ; Gas exchange;  Surgeon: Hayden Pedro, MD;  Location: [place];  Service: Ophthalmology;  Laterality: Right;  . Left heart catheterization with coronary angiogram N/A 01/11/2012    Procedure: LEFT HEART CATHETERIZATION WITH CORONARY ANGIOGRAM;  Surgeon: Leonie Man, MD;  Location: [facility] CATH LAB;  Service: Cardiovascular;  Laterality: N/A;  . Compression hip screw Left 04/04/2014    Procedure: COMPRESSION HIP LEFT;  Surgeon: Mcarthur Rossetti, MD;  Location: [place];  Service: Orthopedics;  Laterality: Left;    Prior to Admission medications   Medication Sig Start Date End Date Taking? Authorizing Provider  atorvastatin (LIPITOR) 40 MG tablet Take 1 tablet (40 mg total) by mouth daily at 6 PM. *MUST MAKE APPOINTMENT* 04/29/14  Yes Minus Breeding, MD  calcium carbonate (TUMS EX) 750 MG chewable tablet Chew 3-4 tablets by mouth daily.    Yes Historical Provider, MD  carvedilol (COREG) 12.5 MG tablet Take 0.5 tablets by mouth 2 (two) times daily.   Yes Historical Provider, MD  cholestyramine Lucrezia Starch) 4 G packet Take 4 g by mouth daily as needed (with 4 ounces of juice.).  04/01/14  Yes Historical Provider,  MD  FIBER SELECT GUMMIES PO Take 2 tablets by mouth daily.   Yes Historical Provider, MD  Multiple Vitamins-Minerals (MULTIVITAMIN WITH MINERALS) tablet Take 1 tablet by mouth daily.     Yes Historical Provider, MD  oxyCODONE (OXY IR/ROXICODONE) 5 MG immediate release tablet Take 10 mg by mouth every 4 (four) hours as needed for severe pain.   Yes Historical Provider, MD  pantoprazole (PROTONIX) 40 MG tablet Take 1 tablet by mouth 2 (two) times daily.  Yes Historical Provider, MD  Saxagliptin-Metformin 2.07-998 MG TB24 Take 1 tablet by mouth daily.    Yes Historical Provider, MD  tadalafil (CIALIS) 5 MG tablet Take 5 mg by mouth daily. For interstitial cystitis.   Yes Historical Provider, MD  aspirin 325 MG EC tablet Take 1 tablet (325 mg total) by mouth 2 (two) times daily after a meal. Patient taking differently: Take 325 mg by mouth daily.  04/07/14   Mcarthur Rossetti, MD  methocarbamol (ROBAXIN) 500 MG tablet Take 1 tablet (500 mg total) by mouth every 6 (six) hours as needed for muscle spasms. Patient not taking: Reported on 06/04/2014 04/07/14   Mcarthur Rossetti, MD  oxyCODONE-acetaminophen (ROXICET) 5-325 MG per tablet Take 1-2 tablets by mouth every 4 (four) hours as needed for severe pain. Patient not taking: Reported on 06/04/2014 04/07/14   Mcarthur Rossetti, MD    Current Facility-Administered Medications  Medication Dose Route Frequency Provider Last Rate Last Dose  . 0.9 %  sodium chloride infusion   Intravenous Continuous Charlynne Cousins, MD 100 mL/hr at 06/05/14 0530    . 0.9 %  sodium chloride infusion   Intravenous Once Charlynne Cousins, MD      . carvedilol (COREG) tablet 6.25 mg  6.25 mg Oral BID WC Charlynne Cousins, MD   6.25 mg at 06/04/14 2004  . ondansetron (ZOFRAN) tablet 4 mg  4 mg Oral Q6H PRN Charlynne Cousins, MD       Or  . ondansetron [facility]) injection 4 mg  4 mg Intravenous Q6H PRN Charlynne Cousins, MD   4 mg at 06/05/14 0532  .  pantoprazole (PROTONIX) injection 40 mg  40 mg Intravenous Q12H Charlynne Cousins, MD   40 mg at 06/04/14 2225  . sodium chloride 0.9 % injection 3 mL  3 mL Intravenous Q12H Charlynne Cousins, MD   3 mL at 06/04/14 2200    Allergies as of 06/04/2014 - Review Complete 06/04/2014  Allergen Reaction Noted  . Morphine Itching 12/27/2007    Family History  Problem Relation Age of Onset  . Colon cancer Neg Hx   . Kidney disease Father   . Emphysema Mother   . Emphysema Maternal Grandfather     History   Social History  . Marital Status: Married    Spouse Name: N/A  . Number of Children: 2  . Years of Education: N/A   Occupational History  . Retired    Social History Main Topics  . Smoking status: Never Smoker   . Smokeless tobacco: Never Used  . Alcohol Use: Yes     Comment: 09/30/2012 - has small glass of wine every other day;01/11/2012 "last alcohol > 2 yr ago; neer had problem w/it"   . Drug Use: No  . Sexual Activity: Yes   Other Topics Concern  . Not on file   Social History Narrative    Review of Systems: Ten point ROS is O/W negative except as mentioned in HPI.  Physical Exam: Vital signs in last 24 hours: Temp:  [97.6 F (36.4 C)-98.7 F (37.1 C)] 97.9 F (36.6 C) (03/18 0516) Pulse Rate:  [71-86] 77 (03/18 0516) Resp:  [15-20] 18 (03/18 0516) BP: (114-141)/(57-73) 130/73 mmHg (03/18 0516) SpO2:  [95 %-100 %] 98 % (03/18 0516) Weight:  [222 lb 10.6 oz (101 kg)-228 lb (103.42 kg)] 222 lb 10.6 oz (101 kg) (03/17 1840) Last BM Date: 06/04/14 General:  Alert, Well-developed, well-nourished, pleasant and cooperative  in NAD Head:  Normocephalic and atraumatic. Eyes:  Sclera clear, no icterus.  Conjunctiva pink. Ears:  Normal auditory acuity. Mouth:  No deformity or lesions.   Lungs:  Clear throughout to auscultation.  No wheezes, crackles, or rhonchi.  Heart:  Regular rate and rhythm; no murmurs, clicks, rubs, or gallops. Abdomen:  Soft, non-distended.   BS present.  Non-tender.  Rectal:  Deferred.  Heme positive in the ED.  Msk:  Symmetrical without gross deformities. Pulses:  Normal pulses noted. Extremities:  Without clubbing or edema. Neurologic:  Alert and  oriented x4;  grossly normal neurologically. Skin:  Intact without significant lesions or rashes. Psych:  Alert and cooperative. Normal mood and affect.  Intake/Output from previous day: 03/17 0701 - 03/18 0700 In: 2150 [I.V.:2150] Out: -   Lab Results:  Recent Labs  06/04/14 1559 06/04/14 1919 06/05/14 0720  WBC 6.2 5.1 5.9  HGB 10.4* 9.8* 9.5*  HCT 30.5* 28.6* 28.3*  PLT 185 172 166   BMET  Recent Labs  06/04/14 1559  NA 142  K 3.6  CL 115*  CO2 20  GLUCOSE 147*  BUN 54*  CREATININE 0.92  CALCIUM 9.0   LFT  Recent Labs  06/04/14 1559  PROT 5.8*  ALBUMIN 3.8  AST 20  ALT 18  ALKPHOS 56  BILITOT 0.7   PT/INR  Recent Labs  06/04/14 1559 06/04/14 1919  LABPROT 15.8* 15.7*  INR 1.24 1.23   IMPRESSION:  -Black stools that were heme positive with dark emesis:  BUN is elevated.  Suspect UGI bleed.  Hgb is stable from baseline.   -Multiple medical problems  PLAN: -Continue protonix 40 mg IV BID for now. -Will plan for EGD later today.   ZEHR, JESSICA D.  06/05/2014, 9:00 AM  Pager number [phone]  GI ATTENDING  History, laboratories, prior endoscopy reports reviewed. Patient personally seen and examined. Appears to have minor upper GI bleeding in the face of nausea and vomiting. On chronic PPI and aspirin. May have had viral illness with Mallory-Weiss tear. Rule out other pathology. Wife at bedside. Plan EGD with therapeutics if necessary.The nature of the procedure, as well as the risks, benefits, and alternatives were carefully and thoroughly reviewed with the patient. Ample time for discussion and questions allowed. The patient understood, was satisfied, and agreed to proceed. The patient is higher than baseline risk due to his age and  comorbidities.  Docia Chuck. Geri Seminole., M.D. [facility] Division of Gastroenterology

## 2014-06-05 NOTE — Op Note (Signed)
[facility], [id number]   ENDOSCOPY PROCEDURE REPORT  PATIENT: Lucas, Morales  MR#: [id number] BIRTHDATE: 02-19-1934 , 42  yrs. old GENDER: male ENDOSCOPIST: Eustace Quail, MD REFERRED BY:  [facility] PROCEDURE DATE:  06/05/2014 PROCEDURE:  EGD, diagnostic ASA CLASS:     Class III INDICATIONS:  hematemesis (coffee grounds)and melena. MEDICATIONS: Fentanyl 50 mcg IV and Versed 4 mg IV TOPICAL ANESTHETIC: Cetacaine Spray  DESCRIPTION OF PROCEDURE: After the risks benefits and alternatives of the procedure were thoroughly explained, informed consent was obtained.  The Pentax Gastroscope [id number] endoscope was introduced through the mouth and advanced to the second portion of the duodenum , Without limitations.  The instrument was slowly withdrawn as the mucosa was fully examined.  EXAM:The esophagus revealed a distal ringlike stricture measuring approximately 15 mm.  Stomach was remarkable for a large (9 cm) sliding hiatal hernia with multiple Cameron erosions.  Within the hernia pouch, retching gastropathy.  Stomach was otherwise normal. The duodenal bulb and post bulbar duodenum were normal.  No active bleeding.  Retroflexed views revealed a hiatal hernia.     The scope was then withdrawn from the patient and the procedure completed.  COMPLICATIONS: There were no immediate complications.  ENDOSCOPIC IMPRESSION: 1. Large hiatal hernia with Lysbeth Galas erosions. May have cause transient minor bleeding 2. Retching gastropathyperiod may have caused transient minor bleeding 3. Incidental esophageal stricture 4. Otherwise normal exam with No active bleeding  RECOMMENDATIONS: 1. Hydrate well. Advance diet 2. Continue PPI 3. Monitor stools and hemoglobin 4. Would likely benefit long-term from iron therapy as Lysbeth Galas erosions are risk factor for iron deficiency anemia 5. Anticipate discharge tomorrow if stable without further  problems. Discussed with patient and wife  REPEAT EXAM:  eSigned:  Eustace Quail, MD 06/05/2014 3:30 PM   ID:[id number] Olevia Perches, MD, Alden Server, MD, and The Patient

## 2014-06-05 NOTE — Progress Notes (Signed)
[facility] PROGRESS NOTE Assessment/Plan: Acute GI bleeding/  Acute upper gastrointestinal bleeding/ h/o  Gastritis and gastroduodenitis/  Acute blood loss anemia: - She is on Protonix IV twice a day, GI was consulted for endoscopy later today. - There is a mild drop in hemoglobin after IV fluids. We'll continue to monitor.  Orthostatic hypotension - Resolved with IV hydration.  Essential  Hypertension - Continue home antihypertensive medications.       Code Status: Full Family Communication: Daughter  Disposition Plan: Inpatient   Consultants:  Gastroenterology  Procedures:  EGD on 06/05/2014.  Antibiotics:  none (indicate start date, and stop date if known)  HPI/Subjective: No complains  Objective: Filed Vitals:   06/04/14 1810 06/04/14 1840 06/04/14 2207 06/05/14 0516  BP: 114/64 141/71 117/61 130/73  Pulse: 78 78 83 77  Temp:  97.6 F (36.4 C) 97.8 F (36.6 C) 97.9 F (36.6 C)  TempSrc:  Oral Oral Oral  Resp: 18 18 18 18   Height:  6' 2.5" (1.892 m)    Weight:  101 kg (222 lb 10.6 oz)    SpO2: 98% 100% 100% 98%    Intake/Output Summary (Last 24 hours) at 06/05/14 1038 Last data filed at 06/05/14 0700  Gross per 24 hour  Intake   2150 ml  Output      0 ml  Net   2150 ml   Filed Weights   06/04/14 1606 06/04/14 1840  Weight: 103.42 kg (228 lb) 101 kg (222 lb 10.6 oz)    Exam:  General: Alert, awake, oriented x3, in no acute distress.  HEENT: No bruits, no goiter.  Heart: Regular rate and rhythm.  Lungs: Good air movement, clear Abdomen: Soft, nontender, nondistended, positive bowel sounds.  Neuro: Grossly intact, nonfocal.   Data Reviewed: Basic Metabolic Panel:  Recent Labs Lab 06/04/14 1559  NA 142  K 3.6  CL 115*  CO2 20  GLUCOSE 147*  BUN 54*  CREATININE 0.92  CALCIUM 9.0   Liver Function Tests:  Recent Labs Lab 06/04/14 1559  AST 20  ALT 18  ALKPHOS 56  BILITOT 0.7  PROT 5.8*  ALBUMIN 3.8   No  results for input(s): LIPASE, AMYLASE in the last 168 hours. No results for input(s): AMMONIA in the last 168 hours. CBC:  Recent Labs Lab 06/04/14 1559 06/04/14 1919 06/05/14 0720  WBC 6.2 5.1 5.9  NEUTROABS 4.0  --   --   HGB 10.4* 9.8* 9.5*  HCT 30.5* 28.6* 28.3*  MCV 96.8 97.6 99.3  PLT 185 172 166   Cardiac Enzymes: No results for input(s): CKTOTAL, CKMB, CKMBINDEX, TROPONINI in the last 168 hours. BNP (last 3 results) No results for input(s): BNP in the last 8760 hours.  ProBNP (last 3 results) No results for input(s): PROBNP in the last 8760 hours.  CBG: No results for input(s): GLUCAP in the last 168 hours.  No results found for this or any previous visit (from the past 240 hour(s)).   Studies: No results found.  Scheduled Meds: . sodium chloride   Intravenous Once  . carvedilol  6.25 mg Oral BID WC  . pantoprazole (PROTONIX) IV  40 mg Intravenous Q12H  . sodium chloride  3 mL Intravenous Q12H   Continuous Infusions: . sodium chloride 100 mL/hr at 06/05/14 0530     FELIZ Marguarite Arbour  [facility] Pager [phone]. If 7PM-7AM, please contact night-coverage at [email or web address], password [facility] 06/05/2014, 10:38 AM  LOS: 1 day

## 2014-06-06 DIAGNOSIS — K253 Acute gastric ulcer without hemorrhage or perforation: Secondary | ICD-10-CM

## 2014-06-06 DIAGNOSIS — D61818 Other pancytopenia: Secondary | ICD-10-CM | POA: Insufficient documentation

## 2014-06-06 LAB — CBC
HCT: 22.4 % — ABNORMAL LOW (ref 39.0–52.0)
Hemoglobin: 7.7 g/dL — ABNORMAL LOW (ref 13.0–17.0)
MCH: 33.9 pg (ref 26.0–34.0)
MCHC: 34.4 g/dL (ref 30.0–36.0)
MCV: 98.7 fL (ref 78.0–100.0)
Platelets: 101 10*3/uL — ABNORMAL LOW (ref 150–400)
RBC: 2.27 MIL/uL — ABNORMAL LOW (ref 4.22–5.81)
RDW: 14.8 % (ref 11.5–15.5)
WBC: 3.5 10*3/uL — ABNORMAL LOW (ref 4.0–10.5)

## 2014-06-06 MED ORDER — PANTOPRAZOLE SODIUM 40 MG PO TBEC: 40.0000 mg | DELAYED_RELEASE_TABLET | Freq: Two times a day (BID) | ORAL | Status: AC

## 2014-06-06 NOTE — Discharge Summary (Signed)
Physician Discharge Summary  Lucas Morales GLO:[id number] DOB: [birth year] DOA: 06/04/2014  PCP: Jackelyn Knife, MD  Admit date: 06/04/2014 Discharge date: 06/06/2014  Time spent: 30 minutes  Recommendations for Outpatient Follow-up:  1. Follow up with GI in 4 weeks. 2. Follow up with cardiology 1 week.  Discharge Diagnoses:  Active Problems:   Gastritis and gastroduodenitis   Hypertension   Acute blood loss anemia   Acute GI bleeding   Orthostatic hypotension   Acute upper gastrointestinal bleeding   Sliding hiatal hernia   Gastric erosions   Discharge Condition: stable  Diet recommendation: Carb modified  Filed Weights   06/04/14 1606 06/04/14 1840  Weight: 103.42 kg (228 lb) 101 kg (222 lb 10.6 oz)    History of present illness:  81 y.o. male with past medical history of a recent hip fracture status post replacement on 04/08/2014, and a history of gastritis, coronary artery sees and CABG in 2013, history of paroxysmal A. fib that comes in for melanotic stools and hematemesis the started the day prior to admission. He relates he started having black stools which have progressively gotten worse, one day prior to admission he started having hematemesis what seems like ground coffee as per patient has not been able to tolerate anything by mouth. He relates he gets frequent diarrhea but that is chronic. He denies any NSAIDs use except for aspirin 325 which he has been on for a number of years. He relates some dizziness upon standing no chest pain or shortness of breath.  Hospital Course:  Acute GI bleeding/ Acute upper gastrointestinal bleeding/ h/o Gastritis and gastroduodenitis/ Acute blood loss anemia: - He is on Protonix IV twice a day, GI was consulted for endoscopy which showed cameron lesion - They rec protonix BID and stop ASA. - will follow with GI in 4 weeks, cardiology in 2 weeks.  Orthostatic hypotension - Resolved with IV hydration.  Essential  Hypertension - Continue home antihypertensive medications.   Procedures:  EGD 3.19.2016:  Consultations:  GI  Discharge Exam: Filed Vitals:   06/06/14 0558  BP: 125/62  Pulse: 72  Temp: 98.2 F (36.8 C)  Resp: 18    General: A&O x3 Cardiovascular: RRR Respiratory: good air movement CTA B/L  Discharge Instructions   Discharge Instructions    Diet - low sodium heart healthy    Complete by:  As directed      Increase activity slowly    Complete by:  As directed           Current Discharge Medication List    START taking these medications   Details  !! pantoprazole (PROTONIX) 40 MG tablet Take 1 tablet (40 mg total) by mouth 2 (two) times daily. Qty: 60 tablet, Refills: 3     !! - Potential duplicate medications found. Please discuss with provider.    CONTINUE these medications which have NOT CHANGED   Details  atorvastatin (LIPITOR) 40 MG tablet Take 1 tablet (40 mg total) by mouth daily at 6 PM. *MUST MAKE APPOINTMENT* Qty: 90 tablet, Refills: 0    calcium carbonate (TUMS EX) 750 MG chewable tablet Chew 3-4 tablets by mouth daily.     carvedilol (COREG) 12.5 MG tablet Take 0.5 tablets by mouth 2 (two) times daily.    cholestyramine (QUESTRAN) 4 G packet Take 4 g by mouth daily as needed (with 4 ounces of juice.).     FIBER SELECT GUMMIES PO Take 2 tablets by mouth daily.    Multiple Vitamins-Minerals (  MULTIVITAMIN WITH MINERALS) tablet Take 1 tablet by mouth daily.      oxyCODONE (OXY IR/ROXICODONE) 5 MG immediate release tablet Take 10 mg by mouth every 4 (four) hours as needed for severe pain.    !! pantoprazole (PROTONIX) 40 MG tablet Take 1 tablet by mouth 2 (two) times daily.    Saxagliptin-Metformin 2.07-998 MG TB24 Take 1 tablet by mouth daily.     tadalafil (CIALIS) 5 MG tablet Take 5 mg by mouth daily. For interstitial cystitis.    methocarbamol (ROBAXIN) 500 MG tablet Take 1 tablet (500 mg total) by mouth every 6 (six) hours as needed for  muscle spasms. Qty: 60 tablet, Refills: 0    oxyCODONE-acetaminophen (ROXICET) 5-325 MG per tablet Take 1-2 tablets by mouth every 4 (four) hours as needed for severe pain. Qty: 30 tablet, Refills: 0     !! - Potential duplicate medications found. Please discuss with provider.    STOP taking these medications     aspirin 325 MG EC tablet        Allergies  Allergen Reactions  . Morphine Itching      The results of significant diagnostics from this hospitalization (including imaging, microbiology, ancillary and laboratory) are listed below for reference.    Significant Diagnostic Studies: No results found.  Microbiology: No results found for this or any previous visit (from the past 240 hour(s)).   Labs: Basic Metabolic Panel:  Recent Labs Lab 06/04/14 1559  NA 142  K 3.6  CL 115*  CO2 20  GLUCOSE 147*  BUN 54*  CREATININE 0.92  CALCIUM 9.0   Liver Function Tests:  Recent Labs Lab 06/04/14 1559  AST 20  ALT 18  ALKPHOS 56  BILITOT 0.7  PROT 5.8*  ALBUMIN 3.8   No results for input(s): LIPASE, AMYLASE in the last 168 hours. No results for input(s): AMMONIA in the last 168 hours. CBC:  Recent Labs Lab 06/04/14 1559 06/04/14 1919 06/05/14 0720 06/05/14 1834 06/06/14 0618  WBC 6.2 5.1 5.9 3.7* 3.5*  NEUTROABS 4.0  --   --   --   --   HGB 10.4* 9.8* 9.5* 8.4* 7.7*  HCT 30.5* 28.6* 28.3* 24.4* 22.4*  MCV 96.8 97.6 99.3 98.8 98.7  PLT 185 172 166 130* 101*   Cardiac Enzymes: No results for input(s): CKTOTAL, CKMB, CKMBINDEX, TROPONINI in the last 168 hours. BNP: BNP (last 3 results) No results for input(s): BNP in the last 8760 hours.  ProBNP (last 3 results) No results for input(s): PROBNP in the last 8760 hours.  CBG: No results for input(s): GLUCAP in the last 168 hours.     Signed:  Charlynne Cousins  [facility] 06/06/2014, 10:22 AM

## 2014-06-06 NOTE — Progress Notes (Signed)
     [facility] Progress Note  Subjective:  Feels better.  No nausea, vomiting, abdominal pain.  No BM.  Tolerating clear liquids and would like to eat and go home later today if possible.  Objective:  Vital signs in last 24 hours: Temp:  [98 F (36.7 C)-98.2 F (36.8 C)] 98.2 F (36.8 C) (03/19 0558) Pulse Rate:  [72-79] 72 (03/19 0558) Resp:  [8-19] 18 (03/19 0558) BP: (90-144)/(37-65) 125/62 mmHg (03/19 0558) SpO2:  [97 %-100 %] 97 % (03/19 0558) Last BM Date: 06/05/14 General:  Alert, Well-developed, in NAD Heart:  Regular rate and rhythm Pulm:  CTAB.   Abdomen:  Soft, non-distended.  BS present.  Non-tender. Neurologic:  Alert and  oriented x4;  grossly normal neurologically.  Intake/Output from previous day: 03/18 0701 - 03/19 0700 In: 2338.3 [I.V.:2338.3] Out: -   Lab Results:  Recent Labs  06/05/14 0720 06/05/14 1834 06/06/14 0618  WBC 5.9 3.7* 3.5*  HGB 9.5* 8.4* 7.7*  HCT 28.3* 24.4* 22.4*  PLT 166 130* 101*   BMET  Recent Labs  06/04/14 1559  NA 142  K 3.6  CL 115*  CO2 20  GLUCOSE 147*  BUN 54*  CREATININE 0.92  CALCIUM 9.0   LFT  Recent Labs  06/04/14 1559  PROT 5.8*  ALBUMIN 3.8  AST 20  ALT 18  ALKPHOS 56  BILITOT 0.7   PT/INR  Recent Labs  06/04/14 1559 06/04/14 1919  LABPROT 15.8* 15.7*  INR 1.24 1.23   Assessment / Plan: -Large hiatal hernia with Lysbeth Galas lesions as well as retching gastropathy, both of which may have been the cause of transient bleeding.  -Black stools that were heme positive with dark emesis:  Likely secondary to above. -Acute on chronic anemia:  Component of chronic anemia may be from slow chronic bleeding from [facility] lesions.  Acutely Hgb is trending down some this morning, but may be somewhat dilutional as there has been no sign of active bleeding. -Multiple medical problems  *Patient wants to eat and go home today.  Will advance to soft diet and if he tolerates that without any  sign of bleeding throughout the day then he could possibly go home this evening with Hgb check on Monday and close follow-up with PCP.  Should go home on iron supplement, at least 325 mg daily, as well.   LOS: 2 days   ZEHR, JESSICA D.  06/06/2014, 8:48 AM  Pager number [phone]  GI ATTENDING  Interval history due to reviewed. Patient personally seen and examined. Agree with interval progress note as above. Patient feels better and looks better. Eating breakfast. No bowel movements. Blood counts drifted, secondary to hydration. I suspect all process was initiated by a viral infection. I also think this accounts for the new leukopenia and thrombocytopenia. Recommend that he take iron daily. I've also instructed him to have follow-up CBC with his PCP, Dr. Shelia Media, in the next week or 2 to make sure his blood counts are improving. He understands and agrees. Also, instructed to contact our office should he feel that he needs our services. He understands and agrees. Okay to go home from GI perspective.  Docia Chuck. Geri Seminole., M.D. [facility]

## 2014-06-08 ENCOUNTER — Encounter (HOSPITAL_COMMUNITY): Payer: Self-pay | Admitting: Internal Medicine

## 2014-06-10 ENCOUNTER — Telehealth: Payer: Self-pay | Admitting: Cardiovascular Disease

## 2014-06-10 NOTE — Telephone Encounter (Signed)
Closed encounter °

## 2014-06-16 ENCOUNTER — Other Ambulatory Visit: Payer: Self-pay | Admitting: Internal Medicine

## 2014-06-17 ENCOUNTER — Other Ambulatory Visit: Payer: Self-pay | Admitting: Cardiology

## 2014-06-17 NOTE — Telephone Encounter (Signed)
Rx refill sent to patient pharmacy  Patient has appointment with Mickel Baas in  April

## 2014-07-01 ENCOUNTER — Encounter: Payer: Self-pay | Admitting: Cardiology

## 2014-07-01 ENCOUNTER — Ambulatory Visit (INDEPENDENT_AMBULATORY_CARE_PROVIDER_SITE_OTHER): Payer: Medicare PPO | Admitting: Cardiology

## 2014-07-01 VITALS — BP 154/80 | HR 71 | Ht 74.5 in | Wt 236.8 lb

## 2014-07-01 DIAGNOSIS — Z79899 Other long term (current) drug therapy: Secondary | ICD-10-CM | POA: Diagnosis not present

## 2014-07-01 DIAGNOSIS — I2510 Atherosclerotic heart disease of native coronary artery without angina pectoris: Secondary | ICD-10-CM | POA: Diagnosis not present

## 2014-07-01 DIAGNOSIS — K2901 Acute gastritis with bleeding: Secondary | ICD-10-CM

## 2014-07-01 LAB — BASIC METABOLIC PANEL
BUN: 17 mg/dL (ref 6–23)
CHLORIDE: 107 meq/L (ref 96–112)
CO2: 22 meq/L (ref 19–32)
CREATININE: 0.94 mg/dL (ref 0.50–1.35)
Calcium: 9 mg/dL (ref 8.4–10.5)
Glucose, Bld: 79 mg/dL (ref 70–99)
Potassium: 4.2 mEq/L (ref 3.5–5.3)
Sodium: 138 mEq/L (ref 135–145)

## 2014-07-01 LAB — CBC
HCT: 33.1 % — ABNORMAL LOW (ref 39.0–52.0)
HEMOGLOBIN: 10.6 g/dL — AB (ref 13.0–17.0)
MCH: 30.8 pg (ref 26.0–34.0)
MCHC: 32 g/dL (ref 30.0–36.0)
MCV: 96.2 fL (ref 78.0–100.0)
MPV: 10.9 fL (ref 8.6–12.4)
Platelets: 215 10*3/uL (ref 150–400)
RBC: 3.44 MIL/uL — AB (ref 4.22–5.81)
RDW: 14.5 % (ref 11.5–15.5)
WBC: 3.9 10*3/uL — ABNORMAL LOW (ref 4.0–10.5)

## 2014-07-01 NOTE — Patient Instructions (Signed)
Your physician recommends that you return for lab work in: today at [facility].   Your physician recommends that you schedule a follow-up appointment in: 3-4 months with Dr. Gwenlyn Found.

## 2014-07-01 NOTE — Progress Notes (Signed)
Cardiology Office Note   Date:  07/01/2014   ID:  [name], DOB [birth year], MRN [id number]  PCP:  Horatio Pel, MD  Cardiologist:  Dr. Gwenlyn Found    Chief Complaint  Patient presents with  . Follow-up    2 week follow-up.  No complaints of chest pain, SOB, edema or dizziness.      History of Present Illness: Lucas Morales is a 81 y.o. male who presents for post hospital and  He has a history of hypertension and hyperlipidemia. He was initially referred to me because of dyspnea. A CT angiogram of his heart performed three years prior showed moderate plaquing in all three coronary arteries with a calcium score of 800, and a 2D echo revealed an EF of 45 to 50%. Because of increasing symptoms of dyspnea on exertion, as well as chest pain radiating to his left upper extremity, a Myoview stress test was performed on January 04, 2012, which showed inferolateral ischemia. Ultimately he was catheterized by Dr. Glenetta Hew on October 24, revealing two vessel disease, and he underwent coronary artery bypass grafting x4 by Dr. Tharon Aquas Trigt on October 31 with a LIMA to his LAD, vein to a distal LAD, obtuse marginal brach 3 and 4. His postoperative course was complicated by PAF, for which he was placed on Coumadin anticoagulation, Digoxin, and amiodarone. When he was seen in follow up in January, 2015 he had a prolonged episode of chest pain. A Myoview stress test performed on January 21 was nonischemic. A MCOT showed no evidence of PAF, and as a result, his amiodarone was stopped, and his Coumadin, and Digoxin. He participated in cardiac rehab.    ECHO : 04/08/13 Left ventricle: The cavity size was normal. Wall thickness was increased in a pattern of severe LVH. Systolic function was normal. The estimated ejection fraction was in the range of 55% to 60%. - Mitral valve: Mild regurgitation. - Left atrium: The atrium was moderately dilated. - Atrial septum: No defect or  patent foramen ovale was identified.  nuc study 04/01/13 Overall Impression: Intermediate risk stress nuclear study with  a small area of anteroseptal reversibility. There is a fixed  inferoapical defect. SDS 4. Extent 6%. LV Wall Motion: Anteroseptal hypokinesis. EF 51%.-- not significant enough to warrant cath.  2 recent hospitalizations in Jan. 2016 for fall with Lt hip fracture, with closed repair.  Again in March for acute GI bleed.  Secondary to large hiatal hernia with Lysbeth Galas lesions as well as retching gastropathy, both of which may have been the cause of transient bleeding. He remains on Iron, last H/H 7.7/22.4.  He denies chest pain and his SOB is stable.  He has been taking his 325 mg ASA daily, though this was stopped at discharge.  We will check with Dr. Adora Fridge and decrease to 81 mg daily.  BP up some today.  Will monitor.  He is to take cruise transatlantic to [place] and [place] in May.   No chest pain, stable SOB.  Unable to use CPAP for his mild sleep apnea.  No palpitations.    Past Medical History  Diagnosis Date  . Adenomatous colon polyp   . Hemorrhoids   . Diverticulosis   . Gastritis   . IC (interstitial cystitis)   . Esophageal stricture   . Diverticulitis   . Hyperlipidemia     doesn't require meds  . Anginal pain 01/11/2012  . Bronchitis     "I've had it a few  times" (01/11/2012)  . Pre-diabetes     "check my sugars q 3 days" (01/11/2012)  . Gout   . Dysrhythmia     bradycardia in the 30's (01/11/2012)  . Coronary artery disease   . Hypertension     takes Losartan daily  . Diabetes mellitus without complication     "borderline"  . History of bronchitis     last time about 3-9yrs ago  . Joint pain   . Joint swelling   . Gout     takes ALlopurinol daily  . GERD (gastroesophageal reflux disease)     takes Pantoprazole daily  . History of colon polyps   . Urinary frequency   . Interstitial cystitis   . Depression     does't require any meds   . Nocturia   . Insomnia     takes Nortrypyline nightly  . Shortness of breath 01/11/2012    pt states all of the time drSOOD DID PFT RECENTLY  . Hiatal hernia   . Esophageal dysmotility   . Arthritis     "knees" (01/11/2012)  . Paroxysmal atrial fibrillation   . Hyperlipidemia   . Dyspnea on exertion   . OSA (obstructive sleep apnea) 10/21/2012    Past Surgical History  Procedure Laterality Date  . Reconstruction medial collateral ligament elbow w/ tendon graft  1988 X 2    right  . Total knee arthroplasty  ~ 2001; 2011    right; left  . Bladder lesion removed    . Exploratory laparotomy  2009    with right colectomy  . Tonsillectomy and adenoidectomy  ~1943  . Appendectomy  2009  . Cholecystectomy  2009  . Lumbar disc surgery  ?2010    "cleaned out the stenosis" (01/11/2012)  . Cystoscopy with ureteroscopy  2008; 2009    "painted inside of bladder wall"  . Cataract extraction w/ intraocular lens  implant, bilateral  2011  . Tumor excision  12/2011    left arm  . Elbow surgery  1988    right  . Colectomy      d/t diverticulosis  . Heel spurs removed  25+yrs ago    bil   . Colonoscopy    . Esophagogastroduodenoscopy    . Coronary artery bypass graft  01/18/2012    Procedure: CORONARY ARTERY BYPASS GRAFTING (CABG);  Surgeon: Ivin Poot, MD;  Location: [place];  Service: Open Heart Surgery;  Laterality: N/A;  times four, using left internal mammary artery  . Endovein harvest of greater saphenous vein  01/18/2012    Procedure: ENDOVEIN HARVEST OF GREATER SAPHENOUS VEIN;  Surgeon: Ivin Poot, MD;  Location: [place];  Service: Open Heart Surgery;  Laterality: Right;  . Cardiac catheterization  01/11/2012  . Joint replacement  90's and 2000's    bil knee  . Pars plana vitrectomy w/ repair of macular hole Right 10/08/2012    Dr Zigmund Daniel  . 25 gauge pars plana vitrectomy with 20 gauge mvr port for macular hole Right 10/08/2012    Procedure: 25 GAUGE PARS PLANA  VITRECTOMY WITH 20 GAUGE MVR PORT FOR MACULAR HOLE; Membrame peel, serum patch; Laser treatment ; Gas exchange;  Surgeon: Hayden Pedro, MD;  Location: [place];  Service: Ophthalmology;  Laterality: Right;  . Left heart catheterization with coronary angiogram N/A 01/11/2012    Procedure: LEFT HEART CATHETERIZATION WITH CORONARY ANGIOGRAM;  Surgeon: Leonie Man, MD;  Location: [facility];  Service: Cardiovascular;  Laterality: N/A;  .  Compression hip screw Left 04/04/2014    Procedure: COMPRESSION HIP LEFT;  Surgeon: Mcarthur Rossetti, MD;  Location: [place];  Service: Orthopedics;  Laterality: Left;  . Esophagogastroduodenoscopy N/A 06/05/2014    Procedure: ESOPHAGOGASTRODUODENOSCOPY (EGD);  Surgeon: Irene Shipper, MD;  Location: [facility];  Service: Endoscopy;  Laterality: N/A;     Current Outpatient Prescriptions  Medication Sig Dispense Refill  . aspirin 325 MG tablet Take 1 tablet by mouth daily.    [name] atorvastatin (LIPITOR) 40 MG tablet TAKE 1 TABLET BY MOUTH EVERY DAY AT 6 PM 30 tablet 0  . calcium carbonate (TUMS EX) 750 MG chewable tablet Chew 3-4 tablets by mouth daily.     . carvedilol (COREG) 12.5 MG tablet Take 0.5 tablets by mouth 2 (two) times daily.    . cholestyramine (QUESTRAN) 4 G packet Take 4 g by mouth daily as needed (with 4 ounces of juice.).     [name] ferrous sulfate 325 (65 FE) MG tablet Take 1 tablet by mouth daily.    [name] FIBER SELECT GUMMIES PO Take 2 tablets by mouth daily.    . Multiple Vitamins-Minerals (MULTIVITAMIN WITH MINERALS) tablet Take 1 tablet by mouth daily.      . naproxen (NAPROSYN) 500 MG tablet Take 1 tablet by mouth 3 (three) times daily.    . pantoprazole (PROTONIX) 40 MG tablet Take 1 tablet by mouth 2 (two) times daily.    . pantoprazole (PROTONIX) 40 MG tablet Take 1 tablet (40 mg total) by mouth 2 (two) times daily. 60 tablet 3  . Saxagliptin-Metformin 2.07-998 MG TB24 Take 1 tablet by mouth daily.     . tadalafil (CIALIS) 5 MG tablet Take 5  mg by mouth daily. For interstitial cystitis.    . TRANSDERM-SCOP 1 MG/3DAYS Place 1 patch onto the skin as needed.  3  . vitamin C (ASCORBIC ACID) 500 MG tablet Take 1 tablet by mouth daily.     No current facility-administered medications for this visit.    Allergies:   Morphine    Social History:  The patient  reports that he has never smoked. He has never used smokeless tobacco. He reports that he drinks alcohol. He reports that he does not use illicit drugs.   Family History:  The patient's family history includes Emphysema in his maternal grandfather and mother; Kidney disease in his father. There is no history of Colon cancer.    ROS:  General:no colds or fevers, no weight changes Skin:no rashes or ulcers HEENT:no blurred vision, no congestion CV:see HPI PUL:see HPI GI:no diarrhea constipation.  No further melena, no indigestion GU:no hematuria, no dysuria MS:no joint pain, no claudication Neuro:no syncope, no lightheadedness Endo:+ diabetes, no thyroid disease  Wt Readings from Last 3 Encounters:  07/01/14 236 lb 12.8 oz (107.412 kg)  06/04/14 222 lb 10.6 oz (101 kg)  04/10/14 237 lb (107.502 kg)     PHYSICAL EXAM: VS:  BP 154/80 mmHg  Pulse 71  Ht 6' 2.5" (1.892 m)  Wt 236 lb 12.8 oz (107.412 kg)  BMI 30.01 kg/m2 , BMI Body mass index is 30.01 kg/(m^2). General:Pleasant affect, NAD Skin:Warm and dry, brisk capillary refill HEENT:normocephalic, sclera clear, mucus membranes moist Neck:supple, no JVD, no bruits  Heart:S1S2 RRR without murmur, gallup, rub or click Lungs:clear without rales, rhonchi, or wheezes NWG:NFAO, non tender, + BS, do not palpate liver spleen or masses Ext:no lower ext edema, 2+ pedal pulses, 2+ radial pulses Neuro:alert and oriented, MAE, follows commands, +  facial symmetry    EKG:  EKG is NOT ordered today.  Recent Labs: 06/04/2014: ALT 18; BUN 54*; Creatinine 0.92; Potassium 3.6; Sodium 142 06/06/2014: Hemoglobin 7.7*; Platelets 101*     Lipid Panel    Component Value Date/Time   CHOL 89 04/04/2014 0502   TRIG 214* 04/04/2014 0502   HDL 23* 04/04/2014 0502   CHOLHDL 3.9 04/04/2014 0502   VLDL 43* 04/04/2014 0502   LDLCALC 23 04/04/2014 0502       Other studies Reviewed: Additional studies/ records that were reviewed today include: labs, hospitalizations.   ASSESSMENT AND PLAN:  S/P CABG x 4, elective, 01/18/12 One episode of pain previously evaluated: 2-D echocardiogram which revealed normal LV systolic function with severe concentric left ventricular hypertrophy and a Myoview stress test showed subtle inner apical ischemia though to my eye this appeared to be a low-risk does admit to being deconditioned. I'm not convinced that he has a structural abnormality contributing to his symptoms. No further chest pain and no palpitations.  Hypertension Elevated somewhat today on current medications- he will monitor.  Hyperlipidemia On statin therapy followed by his PCP. We'll obtain a copy of this for his records  Recent GI Bleed:  Will decrease ASA to 81 mg daily if ok with Dr. Adora Fridge. No further black stools.  Acute blood loss anemia, hgb 7.7 on discharge. Will recheck labs today.   Follow up wihe Dr. Adora Fridge in 3-4 months.  Will send record to Dr. Adora Fridge concerning pt's transatlantic crossing by cruise ship to see if he agrees to trip.  Pt aware.     Current medicines are reviewed with the patient today.  The patient Has no concerns regarding medicines.  The following changes have been made:  See above Labs/ tests ordered today include:see above  Disposition:   FU:  see above  Signed, Isaiah Serge, NP  07/01/2014 1:45 PM    [facility] [employer], [place], [place], [place] Phone: [phone]; Fax: [phone]

## 2014-07-02 ENCOUNTER — Telehealth: Payer: Self-pay | Admitting: *Deleted

## 2014-07-02 NOTE — Telephone Encounter (Signed)
-----   Message from Isaiah Serge, NP sent at 07/02/2014  8:53 AM EDT ----- Labs improved, hgb up to 10.6 continue iron.  Decrease ASA to 81 mg daily.

## 2014-07-02 NOTE — Telephone Encounter (Signed)
Patient notified of results.

## 2014-07-06 ENCOUNTER — Ambulatory Visit: Payer: Medicare PPO | Admitting: Physical Therapy

## 2014-07-16 ENCOUNTER — Encounter: Payer: Self-pay | Admitting: Internal Medicine

## 2014-08-21 ENCOUNTER — Telehealth: Payer: Self-pay | Admitting: *Deleted

## 2014-08-21 NOTE — Telephone Encounter (Signed)
Requesting surgical clearance:   1. Type of surgery: Ophthalmic surgery at [facility] under General anesthesia  2. Surgeon: Dr Tempie Hoist  3. Surgical date: TBA  4. Medications that need to be help: ASA  5. CAD: Yes coronary artery bypass grafting x4 by Dr. Tharon Aquas Trigt on October 31 with a LIMA to his LAD, vein to a distal LAD, obtuse marginal brach 3 and 4. His postoperative course was complicated by PAF, for which he was placed on Coumadin anticoagulation, Digoxin, and amiodarone. When he was seen in follow up in January, 2015 he had a prolonged episode of chest pain. A Myoview stress test performed on April 09, 2013 was nonischemic. A MCOT showed no evidence of PAF, and as a result, his amiodarone was stopped, and his Coumadin, and Digoxin.           EF: 55-60% as seen on echo from 03/2013  6. I will defer to: Dr Gwenlyn Found

## 2014-08-21 NOTE — Telephone Encounter (Signed)
Cleared for his ophthalmologic surgery at low risk

## 2014-08-24 NOTE — Telephone Encounter (Signed)
Dr Gwenlyn Found, okay to hold ASA?  Please advise

## 2014-08-26 NOTE — Telephone Encounter (Signed)
Okay to hold aspirin. 

## 2014-08-27 NOTE — Telephone Encounter (Signed)
Encounter routed to [facility] [place]. [phone]

## 2014-08-28 DIAGNOSIS — H35342 Macular cyst, hole, or pseudohole, left eye: Secondary | ICD-10-CM

## 2014-08-28 NOTE — H&P (Signed)
Lucas Morales is an 81 y.o. male.   Chief Complaint:loss of vision left eye 5 months ago HPI: loss of vision from macular hole left eye  Past Medical History  Diagnosis Date  . Adenomatous colon polyp   . Hemorrhoids   . Diverticulosis   . Gastritis   . IC (interstitial cystitis)   . Esophageal stricture   . Diverticulitis   . Hyperlipidemia     doesn't require meds  . Anginal pain 01/11/2012  . Bronchitis     "I've had it a few times" (01/11/2012)  . Pre-diabetes     "check my sugars q 3 days" (01/11/2012)  . Gout   . Dysrhythmia     bradycardia in the 30's (01/11/2012)  . Coronary artery disease   . Hypertension     takes Losartan daily  . Diabetes mellitus without complication     "borderline"  . History of bronchitis     last time about 3-22yrs ago  . Joint pain   . Joint swelling   . Gout     takes ALlopurinol daily  . GERD (gastroesophageal reflux disease)     takes Pantoprazole daily  . History of colon polyps   . Urinary frequency   . Interstitial cystitis   . Depression     does't require any meds  . Nocturia   . Insomnia     takes Nortrypyline nightly  . Shortness of breath 01/11/2012    pt states all of the time drSOOD DID PFT RECENTLY  . Hiatal hernia   . Esophageal dysmotility   . Arthritis     "knees" (01/11/2012)  . Paroxysmal atrial fibrillation   . Hyperlipidemia   . Dyspnea on exertion   . OSA (obstructive sleep apnea) 10/21/2012    Past Surgical History  Procedure Laterality Date  . Reconstruction medial collateral ligament elbow w/ tendon graft  1988 X 2    right  . Total knee arthroplasty  ~ 2001; 2011    right; left  . Bladder lesion removed    . Exploratory laparotomy  2009    with right colectomy  . Tonsillectomy and adenoidectomy  ~1943  . Appendectomy  2009  . Cholecystectomy  2009  . Lumbar disc surgery  ?2010    "cleaned out the stenosis" (01/11/2012)  . Cystoscopy with ureteroscopy  2008; 2009    "painted inside of  bladder wall"  . Cataract extraction w/ intraocular lens  implant, bilateral  2011  . Tumor excision  12/2011    left arm  . Elbow surgery  1988    right  . Colectomy      d/t diverticulosis  . Heel spurs removed  25+yrs ago    bil   . Colonoscopy    . Esophagogastroduodenoscopy    . Coronary artery bypass graft  01/18/2012    Procedure: CORONARY ARTERY BYPASS GRAFTING (CABG);  Surgeon: Ivin Poot, MD;  Location: [place];  Service: Open Heart Surgery;  Laterality: N/A;  times four, using left internal mammary artery  . Endovein harvest of greater saphenous vein  01/18/2012    Procedure: ENDOVEIN HARVEST OF GREATER SAPHENOUS VEIN;  Surgeon: Ivin Poot, MD;  Location: [place];  Service: Open Heart Surgery;  Laterality: Right;  . Cardiac catheterization  01/11/2012  . Joint replacement  90's and 2000's    bil knee  . Pars plana vitrectomy w/ repair of macular hole Right 10/08/2012    Dr Zigmund Daniel  . 25  gauge pars plana vitrectomy with 20 gauge mvr port for macular hole Right 10/08/2012    Procedure: 25 GAUGE PARS PLANA VITRECTOMY WITH 20 GAUGE MVR PORT FOR MACULAR HOLE; Membrame peel, serum patch; Laser treatment ; Gas exchange;  Surgeon: Hayden Pedro, MD;  Location: [place];  Service: Ophthalmology;  Laterality: Right;  . Left heart catheterization with coronary angiogram N/A 01/11/2012    Procedure: LEFT HEART CATHETERIZATION WITH CORONARY ANGIOGRAM;  Surgeon: Leonie Man, MD;  [facility] LAB;  Service: Cardiovascular;  Laterality: N/A;  . Compression hip screw Left 04/04/2014    Procedure: COMPRESSION HIP LEFT;  Surgeon: Mcarthur Rossetti, MD;  Location: [place];  Service: Orthopedics;  Laterality: Left;  . Esophagogastroduodenoscopy N/A 06/05/2014    Procedure: ESOPHAGOGASTRODUODENOSCOPY (EGD);  Surgeon: Irene Shipper, MD;  [facility];  Service: Endoscopy;  Laterality: N/A;    Family History  Problem Relation Age of Onset  . Colon cancer Neg Hx   .  Kidney disease Father   . Emphysema Mother   . Emphysema Maternal Grandfather    Social History:  reports that he has never smoked. He has never used smokeless tobacco. He reports that he drinks alcohol. He reports that he does not use illicit drugs.  Allergies:  Allergies  Allergen Reactions  . Morphine Itching    No prescriptions prior to admission    Review of systems otherwise negative  There were no vitals taken for this visit.  Physical exam: Mental status: oriented x3. Eyes: See eye exam associated with this date of surgery in media tab.  Scanned in by scanning center Ears, Nose, Throat: within normal limits Neck: Within Normal limits General: within normal limits Chest: Within normal limits Breast: deferred Heart: Within normal limits Abdomen: Within normal limits GU: deferred Extremities: within normal limits Skin: within normal limits  Assessment/Plan Macular hole left eye Plan: To [facility] for Pars plana vitrectomy, laser, membrane peel, serum patch, gas injection left eye  Phillipa Morden, Jonathyn D 08/28/2014, 1:33 PM

## 2014-08-31 ENCOUNTER — Encounter (HOSPITAL_COMMUNITY): Payer: Self-pay | Admitting: *Deleted

## 2014-09-01 ENCOUNTER — Encounter (INDEPENDENT_AMBULATORY_CARE_PROVIDER_SITE_OTHER): Payer: Medicare PPO | Admitting: Ophthalmology

## 2014-09-01 ENCOUNTER — Encounter (HOSPITAL_COMMUNITY): Payer: Self-pay | Admitting: *Deleted

## 2014-09-01 ENCOUNTER — Ambulatory Visit (HOSPITAL_COMMUNITY): Payer: Medicare PPO | Admitting: Anesthesiology

## 2014-09-01 ENCOUNTER — Encounter (HOSPITAL_COMMUNITY)
Admission: RE | Disposition: A | Discharge status: Home / Self Care | Payer: Self-pay | Source: Ambulatory Visit | Attending: Ophthalmology

## 2014-09-01 ENCOUNTER — Ambulatory Visit (HOSPITAL_COMMUNITY)
Admission: RE | Admit: 2014-09-01 | Discharge: 2014-09-02 | Disposition: A | Discharge status: Home / Self Care | Payer: Medicare PPO | Source: Ambulatory Visit | Attending: Ophthalmology | Admitting: Ophthalmology

## 2014-09-01 DIAGNOSIS — M1990 Unspecified osteoarthritis, unspecified site: Secondary | ICD-10-CM | POA: Insufficient documentation

## 2014-09-01 DIAGNOSIS — Z885 Allergy status to narcotic agent status: Secondary | ICD-10-CM | POA: Insufficient documentation

## 2014-09-01 DIAGNOSIS — H35342 Macular cyst, hole, or pseudohole, left eye: Secondary | ICD-10-CM | POA: Diagnosis present

## 2014-09-01 DIAGNOSIS — I480 Paroxysmal atrial fibrillation: Secondary | ICD-10-CM | POA: Insufficient documentation

## 2014-09-01 DIAGNOSIS — D649 Anemia, unspecified: Secondary | ICD-10-CM | POA: Diagnosis not present

## 2014-09-01 DIAGNOSIS — F329 Major depressive disorder, single episode, unspecified: Secondary | ICD-10-CM | POA: Insufficient documentation

## 2014-09-01 DIAGNOSIS — Z961 Presence of intraocular lens: Secondary | ICD-10-CM | POA: Diagnosis not present

## 2014-09-01 DIAGNOSIS — K449 Diaphragmatic hernia without obstruction or gangrene: Secondary | ICD-10-CM | POA: Insufficient documentation

## 2014-09-01 DIAGNOSIS — G4700 Insomnia, unspecified: Secondary | ICD-10-CM | POA: Insufficient documentation

## 2014-09-01 DIAGNOSIS — E119 Type 2 diabetes mellitus without complications: Secondary | ICD-10-CM | POA: Insufficient documentation

## 2014-09-01 DIAGNOSIS — E785 Hyperlipidemia, unspecified: Secondary | ICD-10-CM | POA: Insufficient documentation

## 2014-09-01 DIAGNOSIS — G4733 Obstructive sleep apnea (adult) (pediatric): Secondary | ICD-10-CM | POA: Insufficient documentation

## 2014-09-01 DIAGNOSIS — Z9841 Cataract extraction status, right eye: Secondary | ICD-10-CM | POA: Insufficient documentation

## 2014-09-01 DIAGNOSIS — Z791 Long term (current) use of non-steroidal anti-inflammatories (NSAID): Secondary | ICD-10-CM | POA: Diagnosis not present

## 2014-09-01 DIAGNOSIS — M109 Gout, unspecified: Secondary | ICD-10-CM | POA: Diagnosis not present

## 2014-09-01 DIAGNOSIS — I10 Essential (primary) hypertension: Secondary | ICD-10-CM

## 2014-09-01 DIAGNOSIS — K224 Dyskinesia of esophagus: Secondary | ICD-10-CM | POA: Diagnosis not present

## 2014-09-01 DIAGNOSIS — H5462 Unqualified visual loss, left eye, normal vision right eye: Secondary | ICD-10-CM | POA: Insufficient documentation

## 2014-09-01 DIAGNOSIS — Z9842 Cataract extraction status, left eye: Secondary | ICD-10-CM | POA: Insufficient documentation

## 2014-09-01 DIAGNOSIS — Z9889 Other specified postprocedural states: Secondary | ICD-10-CM | POA: Insufficient documentation

## 2014-09-01 DIAGNOSIS — Z951 Presence of aortocoronary bypass graft: Secondary | ICD-10-CM | POA: Insufficient documentation

## 2014-09-01 DIAGNOSIS — K219 Gastro-esophageal reflux disease without esophagitis: Secondary | ICD-10-CM | POA: Diagnosis not present

## 2014-09-01 DIAGNOSIS — Z7982 Long term (current) use of aspirin: Secondary | ICD-10-CM | POA: Insufficient documentation

## 2014-09-01 DIAGNOSIS — Z79899 Other long term (current) drug therapy: Secondary | ICD-10-CM | POA: Insufficient documentation

## 2014-09-01 DIAGNOSIS — H35033 Hypertensive retinopathy, bilateral: Secondary | ICD-10-CM | POA: Diagnosis not present

## 2014-09-01 DIAGNOSIS — H43813 Vitreous degeneration, bilateral: Secondary | ICD-10-CM

## 2014-09-01 DIAGNOSIS — H43312 Vitreous membranes and strands, left eye: Secondary | ICD-10-CM | POA: Insufficient documentation

## 2014-09-01 DIAGNOSIS — Z9049 Acquired absence of other specified parts of digestive tract: Secondary | ICD-10-CM | POA: Insufficient documentation

## 2014-09-01 DIAGNOSIS — Z96653 Presence of artificial knee joint, bilateral: Secondary | ICD-10-CM | POA: Diagnosis not present

## 2014-09-01 DIAGNOSIS — I2510 Atherosclerotic heart disease of native coronary artery without angina pectoris: Secondary | ICD-10-CM | POA: Insufficient documentation

## 2014-09-01 DIAGNOSIS — Z86010 Personal history of colonic polyps: Secondary | ICD-10-CM | POA: Diagnosis not present

## 2014-09-01 DIAGNOSIS — H35343 Macular cyst, hole, or pseudohole, bilateral: Secondary | ICD-10-CM | POA: Diagnosis not present

## 2014-09-01 HISTORY — DX: Low back pain: M54.5

## 2014-09-01 HISTORY — DX: Other chronic pain: G89.29

## 2014-09-01 HISTORY — PX: SERUM PATCH: SHX6091

## 2014-09-01 HISTORY — DX: Spontaneous ecchymoses: R23.3

## 2014-09-01 HISTORY — PX: 25 GAUGE PARS PLANA VITRECTOMY WITH 20 GAUGE MVR PORT FOR MACULAR HOLE: SHX6096

## 2014-09-01 HISTORY — PX: GAS INSERTION: SHX5336

## 2014-09-01 HISTORY — PX: PARS PLANA VITRECTOMY W/ REPAIR OF MACULAR HOLE: SHX2170

## 2014-09-01 HISTORY — DX: Sciatica, unspecified side: M54.30

## 2014-09-01 HISTORY — PX: PHOTOCOAGULATION WITH LASER: SHX6027

## 2014-09-01 HISTORY — DX: Unspecified chronic bronchitis: J42

## 2014-09-01 HISTORY — DX: Low back pain, unspecified: M54.50

## 2014-09-01 HISTORY — PX: MEMBRANE PEEL: SHX5967

## 2014-09-01 HISTORY — DX: Other skin changes: R23.8

## 2014-09-01 HISTORY — DX: Type 2 diabetes mellitus without complications: E11.9

## 2014-09-01 HISTORY — PX: GAS/FLUID EXCHANGE: SHX5334

## 2014-09-01 LAB — CBC
HCT: 35.2 % — ABNORMAL LOW (ref 39.0–52.0)
Hemoglobin: 12.1 g/dL — ABNORMAL LOW (ref 13.0–17.0)
MCH: 31.2 pg (ref 26.0–34.0)
MCHC: 34.4 g/dL (ref 30.0–36.0)
MCV: 90.7 fL (ref 78.0–100.0)
Platelets: 122 10*3/uL — ABNORMAL LOW (ref 150–400)
RBC: 3.88 MIL/uL — ABNORMAL LOW (ref 4.22–5.81)
RDW: 15.8 % — ABNORMAL HIGH (ref 11.5–15.5)
WBC: 4.6 10*3/uL (ref 4.0–10.5)

## 2014-09-01 LAB — GLUCOSE, CAPILLARY
GLUCOSE-CAPILLARY: 104 mg/dL — AB (ref 65–99)
GLUCOSE-CAPILLARY: 191 mg/dL — AB (ref 65–99)
Glucose-Capillary: 122 mg/dL — ABNORMAL HIGH (ref 65–99)

## 2014-09-01 LAB — BASIC METABOLIC PANEL
Anion gap: 8 (ref 5–15)
BUN: 23 mg/dL — AB (ref 6–20)
CALCIUM: 9 mg/dL (ref 8.9–10.3)
CO2: 19 mmol/L — AB (ref 22–32)
CREATININE: 0.9 mg/dL (ref 0.61–1.24)
Chloride: 110 mmol/L (ref 101–111)
GFR calc Af Amer: >60 mL/min (ref >60)
GFR calc non Af Amer: >60 mL/min (ref >60)
GLUCOSE: 122 mg/dL — AB (ref 65–99)
Potassium: 3.9 mmol/L (ref 3.5–5.1)
Sodium: 137 mmol/L (ref 135–145)

## 2014-09-01 LAB — AUTOLOGOUS SERUM PATCH PREP

## 2014-09-01 SURGERY — 25 GAUGE PARS PLANA VITRECTOMY WITH 20 GAUGE MVR PORT FOR MACULAR HOLE
Anesthesia: General | Site: Eye | Laterality: Left

## 2014-09-01 MED ORDER — PHENYLEPHRINE HCL 2.5 % OP SOLN
1.0000 [drp] | OPHTHALMIC | Status: DC | PRN
  Filled 2014-09-01: qty 2

## 2014-09-01 MED ORDER — ACETAMINOPHEN 325 MG PO TABS: 325.0000 mg | ORAL_TABLET | ORAL | Status: DC | PRN

## 2014-09-01 MED ORDER — TETRACAINE HCL 0.5 % OP SOLN
2.0000 [drp] | Freq: Once | OPHTHALMIC | Status: DC
  Filled 2014-09-01: qty 2

## 2014-09-01 MED ORDER — SODIUM CHLORIDE 0.45 % IV SOLN
INTRAVENOUS | Status: DC
  Administered 2014-09-01: 18:00:00 via INTRAVENOUS

## 2014-09-01 MED ORDER — TEMAZEPAM 15 MG PO CAPS: 15.0000 mg | ORAL_CAPSULE | Freq: Every evening | ORAL | Status: DC | PRN

## 2014-09-01 MED ORDER — MAGNESIUM HYDROXIDE 400 MG/5ML PO SUSP: 15.0000 mL | Freq: Four times a day (QID) | ORAL | Status: DC | PRN

## 2014-09-01 MED ORDER — CARVEDILOL 6.25 MG PO TABS
6.2500 mg | ORAL_TABLET | Freq: Two times a day (BID) | ORAL | Status: DC
  Administered 2014-09-01: 6.25 mg via ORAL
  Filled 2014-09-01: qty 1

## 2014-09-01 MED ORDER — GATIFLOXACIN 0.5 % OP SOLN
1.0000 [drp] | OPHTHALMIC | Status: AC | PRN
  Administered 2014-09-01 (×3): 1 [drp] via OPHTHALMIC

## 2014-09-01 MED ORDER — SODIUM CHLORIDE 0.9 % IJ SOLN
INTRAMUSCULAR | Status: DC | PRN
  Administered 2014-09-01: 14:00:00

## 2014-09-01 MED ORDER — INSULIN ASPART 100 UNIT/ML ~~LOC~~ SOLN: 0.0000 [IU] | SUBCUTANEOUS | Status: DC

## 2014-09-01 MED ORDER — BRIMONIDINE TARTRATE 0.2 % OP SOLN
1.0000 [drp] | Freq: Two times a day (BID) | OPHTHALMIC | Status: DC
  Filled 2014-09-01: qty 5

## 2014-09-01 MED ORDER — PROMETHAZINE HCL 25 MG/ML IJ SOLN: 6.2500 mg | INTRAMUSCULAR | Status: DC | PRN

## 2014-09-01 MED ORDER — FERROUS SULFATE 325 (65 FE) MG PO TABS
325.0000 mg | ORAL_TABLET | Freq: Every day | ORAL | Status: DC
Start: 2014-09-01 — End: 2014-09-02
  Administered 2014-09-01: 325 mg via ORAL
  Filled 2014-09-01: qty 1

## 2014-09-01 MED ORDER — CYCLOPENTOLATE HCL 1 % OP SOLN
1.0000 [drp] | OPHTHALMIC | Status: DC | PRN
  Filled 2014-09-01: qty 2

## 2014-09-01 MED ORDER — DEXAMETHASONE SODIUM PHOSPHATE 4 MG/ML IJ SOLN
INTRAMUSCULAR | Status: DC | PRN
  Administered 2014-09-01: 4 mg via INTRAVENOUS

## 2014-09-01 MED ORDER — OXYCODONE HCL 5 MG PO TABS: 5.0000 mg | ORAL_TABLET | ORAL | Status: DC | PRN

## 2014-09-01 MED ORDER — BSS IO SOLN
INTRAOCULAR | Status: AC
  Filled 2014-09-01: qty 15

## 2014-09-01 MED ORDER — SODIUM CHLORIDE 0.9 % IV SOLN
INTRAVENOUS | Status: DC
  Administered 2014-09-01 (×2): via INTRAVENOUS

## 2014-09-01 MED ORDER — ATROPINE SULFATE 1 % OP SOLN
OPHTHALMIC | Status: AC
  Filled 2014-09-01: qty 5

## 2014-09-01 MED ORDER — FENTANYL CITRATE (PF) 250 MCG/5ML IJ SOLN
INTRAMUSCULAR | Status: AC
  Filled 2014-09-01: qty 5

## 2014-09-01 MED ORDER — EPINEPHRINE HCL 1 MG/ML IJ SOLN
INTRAMUSCULAR | Status: AC
  Filled 2014-09-01: qty 1

## 2014-09-01 MED ORDER — NEOSTIGMINE METHYLSULFATE 10 MG/10ML IV SOLN
INTRAVENOUS | Status: DC | PRN
  Administered 2014-09-01: 4 mg via INTRAVENOUS

## 2014-09-01 MED ORDER — PHENYLEPHRINE HCL 2.5 % OP SOLN
1.0000 [drp] | OPHTHALMIC | Status: AC | PRN
  Administered 2014-09-01 (×3): 1 [drp] via OPHTHALMIC

## 2014-09-01 MED ORDER — DEXAMETHASONE SODIUM PHOSPHATE 4 MG/ML IJ SOLN
INTRAMUSCULAR | Status: AC
  Filled 2014-09-01: qty 1

## 2014-09-01 MED ORDER — DEXTROSE 5 % IV SOLN
10.0000 mg | INTRAVENOUS | Status: DC | PRN
  Administered 2014-09-01: 20 ug/min via INTRAVENOUS

## 2014-09-01 MED ORDER — FENTANYL CITRATE (PF) 100 MCG/2ML IJ SOLN
INTRAMUSCULAR | Status: AC
  Administered 2014-09-01: 50 ug via INTRAVENOUS
  Filled 2014-09-01: qty 2

## 2014-09-01 MED ORDER — GATIFLOXACIN 0.5 % OP SOLN
1.0000 [drp] | OPHTHALMIC | Status: DC | PRN
  Filled 2014-09-01: qty 2.5

## 2014-09-01 MED ORDER — FENTANYL CITRATE (PF) 100 MCG/2ML IJ SOLN
25.0000 ug | INTRAMUSCULAR | Status: DC | PRN
  Administered 2014-09-01 (×2): 50 ug via INTRAVENOUS

## 2014-09-01 MED ORDER — SODIUM CHLORIDE 0.9 % IJ SOLN
INTRAMUSCULAR | Status: AC
  Filled 2014-09-01: qty 10

## 2014-09-01 MED ORDER — PREDNISOLONE ACETATE 1 % OP SUSP
1.0000 [drp] | Freq: Four times a day (QID) | OPHTHALMIC | Status: DC
  Filled 2014-09-01: qty 5

## 2014-09-01 MED ORDER — SODIUM HYALURONATE 10 MG/ML IO SOLN
INTRAOCULAR | Status: DC | PRN
  Administered 2014-09-01: 0.85 mL via INTRAOCULAR

## 2014-09-01 MED ORDER — BUPIVACAINE HCL (PF) 0.75 % IJ SOLN
INTRAMUSCULAR | Status: DC | PRN
  Administered 2014-09-01: 10 mL

## 2014-09-01 MED ORDER — POLYMYXIN B SULFATE 500000 UNITS IJ SOLR
INTRAMUSCULAR | Status: AC
  Filled 2014-09-01: qty 1

## 2014-09-01 MED ORDER — VITAMIN C 500 MG PO TABS
500.0000 mg | ORAL_TABLET | Freq: Two times a day (BID) | ORAL | Status: DC
  Administered 2014-09-01: 500 mg via ORAL
  Filled 2014-09-01: qty 1

## 2014-09-01 MED ORDER — 0.9 % SODIUM CHLORIDE (POUR BTL) OPTIME
TOPICAL | Status: DC | PRN
  Administered 2014-09-01: 1000 mL

## 2014-09-01 MED ORDER — ONDANSETRON HCL 4 MG/2ML IJ SOLN
INTRAMUSCULAR | Status: AC
  Filled 2014-09-01: qty 2

## 2014-09-01 MED ORDER — LATANOPROST 0.005 % OP SOLN
1.0000 [drp] | Freq: Every day | OPHTHALMIC | Status: DC
  Filled 2014-09-01: qty 2.5

## 2014-09-01 MED ORDER — GLYCOPYRROLATE 0.2 MG/ML IJ SOLN
INTRAMUSCULAR | Status: DC | PRN
  Administered 2014-09-01: 0.6 mg via INTRAVENOUS

## 2014-09-01 MED ORDER — PROPOFOL 10 MG/ML IV BOLUS
INTRAVENOUS | Status: DC | PRN
  Administered 2014-09-01: 150 mg via INTRAVENOUS

## 2014-09-01 MED ORDER — MEPERIDINE HCL 25 MG/ML IJ SOLN: 6.2500 mg | INTRAMUSCULAR | Status: DC | PRN

## 2014-09-01 MED ORDER — MIDAZOLAM HCL 5 MG/5ML IJ SOLN
INTRAMUSCULAR | Status: DC | PRN
  Administered 2014-09-01: 2 mg via INTRAVENOUS

## 2014-09-01 MED ORDER — LIDOCAINE HCL (CARDIAC) 20 MG/ML IV SOLN
INTRAVENOUS | Status: DC | PRN
  Administered 2014-09-01: 100 mg via INTRAVENOUS

## 2014-09-01 MED ORDER — HYDROCODONE-ACETAMINOPHEN 5-325 MG PO TABS
1.0000 | ORAL_TABLET | ORAL | Status: DC | PRN
  Administered 2014-09-01 – 2014-09-02 (×2): 2 via ORAL
  Filled 2014-09-01 (×2): qty 2

## 2014-09-01 MED ORDER — BACITRACIN-POLYMYXIN B 500-10000 UNIT/GM OP OINT
1.0000 "application " | TOPICAL_OINTMENT | Freq: Three times a day (TID) | OPHTHALMIC | Status: DC
  Filled 2014-09-01 (×2): qty 3.5

## 2014-09-01 MED ORDER — BACITRACIN-POLYMYXIN B 500-10000 UNIT/GM OP OINT
TOPICAL_OINTMENT | OPHTHALMIC | Status: AC
  Filled 2014-09-01: qty 3.5

## 2014-09-01 MED ORDER — LISINOPRIL 20 MG PO TABS
20.0000 mg | ORAL_TABLET | Freq: Two times a day (BID) | ORAL | Status: DC
  Administered 2014-09-01: 20 mg via ORAL
  Filled 2014-09-01: qty 1

## 2014-09-01 MED ORDER — ONDANSETRON HCL 4 MG/2ML IJ SOLN
4.0000 mg | Freq: Four times a day (QID) | INTRAMUSCULAR | Status: DC
  Administered 2014-09-01: 4 mg via INTRAVENOUS
  Filled 2014-09-01: qty 2

## 2014-09-01 MED ORDER — DEXAMETHASONE SODIUM PHOSPHATE 10 MG/ML IJ SOLN
INTRAMUSCULAR | Status: AC
  Filled 2014-09-01: qty 1

## 2014-09-01 MED ORDER — ATROPINE SULFATE 1 % OP SOLN
OPHTHALMIC | Status: DC | PRN
  Administered 2014-09-01: 1 [drp] via OPHTHALMIC

## 2014-09-01 MED ORDER — BACITRACIN-POLYMYXIN B 500-10000 UNIT/GM OP OINT
TOPICAL_OINTMENT | OPHTHALMIC | Status: DC | PRN
  Administered 2014-09-01: 1 via OPHTHALMIC

## 2014-09-01 MED ORDER — PHENYLEPHRINE 40 MCG/ML (10ML) SYRINGE FOR IV PUSH (FOR BLOOD PRESSURE SUPPORT)
PREFILLED_SYRINGE | INTRAVENOUS | Status: AC
  Filled 2014-09-01: qty 10

## 2014-09-01 MED ORDER — ONDANSETRON HCL 4 MG/2ML IJ SOLN
INTRAMUSCULAR | Status: DC | PRN
  Administered 2014-09-01: 4 mg via INTRAVENOUS

## 2014-09-01 MED ORDER — EPINEPHRINE HCL 1 MG/ML IJ SOLN
INTRAOCULAR | Status: DC | PRN
  Administered 2014-09-01: 14:00:00

## 2014-09-01 MED ORDER — GATIFLOXACIN 0.5 % OP SOLN
1.0000 [drp] | Freq: Four times a day (QID) | OPHTHALMIC | Status: DC
  Filled 2014-09-01: qty 2.5

## 2014-09-01 MED ORDER — NAPROXEN 250 MG PO TABS: 500.0000 mg | ORAL_TABLET | Freq: Three times a day (TID) | ORAL | Status: DC | PRN

## 2014-09-01 MED ORDER — ATORVASTATIN CALCIUM 40 MG PO TABS
40.0000 mg | ORAL_TABLET | Freq: Every day | ORAL | Status: DC
  Administered 2014-09-01: 40 mg via ORAL
  Filled 2014-09-01: qty 1

## 2014-09-01 MED ORDER — SAXAGLIPTIN-METFORMIN ER 2.5-1000 MG PO TB24: 1.0000 | ORAL_TABLET | Freq: Every day | ORAL | Status: DC

## 2014-09-01 MED ORDER — BSS IO SOLN
INTRAOCULAR | Status: DC | PRN
  Administered 2014-09-01: 15 mL via INTRAOCULAR

## 2014-09-01 MED ORDER — METFORMIN HCL 500 MG PO TABS
1000.0000 mg | ORAL_TABLET | Freq: Every day | ORAL | Status: DC
  Administered 2014-09-01: 1000 mg via ORAL
  Filled 2014-09-01: qty 2

## 2014-09-01 MED ORDER — CYCLOPENTOLATE HCL 1 % OP SOLN
1.0000 [drp] | OPHTHALMIC | Status: AC | PRN
  Administered 2014-09-01 (×3): 1 [drp] via OPHTHALMIC

## 2014-09-01 MED ORDER — TROPICAMIDE 1 % OP SOLN
1.0000 [drp] | OPHTHALMIC | Status: DC | PRN
  Filled 2014-09-01: qty 3

## 2014-09-01 MED ORDER — ROCURONIUM BROMIDE 100 MG/10ML IV SOLN
INTRAVENOUS | Status: DC | PRN
Start: 2014-09-01 — End: 2014-09-01
  Administered 2014-09-01: 10 mg via INTRAVENOUS
  Administered 2014-09-01: 30 mg via INTRAVENOUS

## 2014-09-01 MED ORDER — PANTOPRAZOLE SODIUM 40 MG PO TBEC
40.0000 mg | DELAYED_RELEASE_TABLET | Freq: Two times a day (BID) | ORAL | Status: DC
  Administered 2014-09-01: 40 mg via ORAL
  Filled 2014-09-01: qty 1

## 2014-09-01 MED ORDER — GENTAMICIN SULFATE 40 MG/ML IJ SOLN
INTRAMUSCULAR | Status: AC
  Filled 2014-09-01: qty 2

## 2014-09-01 MED ORDER — BSS PLUS IO SOLN
INTRAOCULAR | Status: AC
  Filled 2014-09-01: qty 500

## 2014-09-01 MED ORDER — MIDAZOLAM HCL 2 MG/2ML IJ SOLN
INTRAMUSCULAR | Status: AC
  Filled 2014-09-01: qty 2

## 2014-09-01 MED ORDER — TROPICAMIDE 1 % OP SOLN
1.0000 [drp] | OPHTHALMIC | Status: AC | PRN
  Administered 2014-09-01 (×3): 1 [drp] via OPHTHALMIC

## 2014-09-01 MED ORDER — BUPIVACAINE HCL (PF) 0.75 % IJ SOLN
INTRAMUSCULAR | Status: AC
  Filled 2014-09-01: qty 10

## 2014-09-01 MED ORDER — SODIUM HYALURONATE 10 MG/ML IO SOLN
INTRAOCULAR | Status: AC
  Filled 2014-09-01: qty 0.85

## 2014-09-01 MED ORDER — CEFAZOLIN SODIUM-DEXTROSE 2-3 GM-% IV SOLR
2.0000 g | INTRAVENOUS | Status: AC
  Administered 2014-09-01: 2 g via INTRAVENOUS
  Filled 2014-09-01: qty 50

## 2014-09-01 MED ORDER — LINAGLIPTIN 5 MG PO TABS
5.0000 mg | ORAL_TABLET | Freq: Every day | ORAL | Status: DC
  Administered 2014-09-01: 5 mg via ORAL
  Filled 2014-09-01: qty 1

## 2014-09-01 MED ORDER — LOPERAMIDE HCL 2 MG PO CAPS: 2.0000 mg | ORAL_CAPSULE | ORAL | Status: DC | PRN

## 2014-09-01 MED ORDER — MORPHINE SULFATE 2 MG/ML IJ SOLN
1.0000 mg | INTRAMUSCULAR | Status: DC | PRN
Start: 2014-09-01 — End: 2014-09-02

## 2014-09-01 MED ORDER — FENTANYL CITRATE (PF) 100 MCG/2ML IJ SOLN
INTRAMUSCULAR | Status: DC | PRN
  Administered 2014-09-01 (×2): 50 ug via INTRAVENOUS

## 2014-09-01 MED ORDER — ACETAZOLAMIDE SODIUM 500 MG IJ SOLR
500.0000 mg | Freq: Once | INTRAMUSCULAR | Status: AC
  Administered 2014-09-02: 500 mg via INTRAVENOUS
  Filled 2014-09-01: qty 500

## 2014-09-01 MED ORDER — DEXAMETHASONE SODIUM PHOSPHATE 10 MG/ML IJ SOLN
INTRAMUSCULAR | Status: DC | PRN
  Administered 2014-09-01: 10 mg

## 2014-09-01 SURGICAL SUPPLY — 61 items
BLADE EYE CATARACT 19 1.4 BEAV (BLADE) IMPLANT
BLADE MVR KNIFE 19G (BLADE) IMPLANT
BLADE MVR KNIFE 20G (BLADE) ×3 IMPLANT
CANNULA VLV SOFT TIP 25GA (OPHTHALMIC) ×3 IMPLANT
CORDS BIPOLAR (ELECTRODE) ×3 IMPLANT
COTTONBALL LRG STERILE PKG (GAUZE/BANDAGES/DRESSINGS) ×9 IMPLANT
COVER MAYO STAND STRL (DRAPES) IMPLANT
DRAPE INCISE 51X51 W/FILM STRL (DRAPES) IMPLANT
DRAPE OPHTHALMIC 77X100 STRL (CUSTOM PROCEDURE TRAY) ×3 IMPLANT
ERASER HMR WETFIELD 23G BP (MISCELLANEOUS) IMPLANT
FILTER BLUE MILLIPORE (MISCELLANEOUS) IMPLANT
FILTER STRAW FLUID ASPIR (MISCELLANEOUS) ×3 IMPLANT
FORCEPS GRIESHABER ILM 25G A (INSTRUMENTS) ×3 IMPLANT
GAS AUTO FILL CONSTEL (OPHTHALMIC) ×3
GAS AUTO FILL CONSTELLATION (OPHTHALMIC) ×1 IMPLANT
GLOVE SS BIOGEL STRL SZ 6.5 (GLOVE) ×1 IMPLANT
GLOVE SS BIOGEL STRL SZ 7 (GLOVE) ×1 IMPLANT
GLOVE SUPERSENSE BIOGEL SZ 6.5 (GLOVE) ×2
GLOVE SUPERSENSE BIOGEL SZ 7 (GLOVE) ×2
GLOVE SURG 8.5 LATEX PF (GLOVE) ×3 IMPLANT
GOWN STRL REUS W/ TWL LRG LVL3 (GOWN DISPOSABLE) ×3 IMPLANT
GOWN STRL REUS W/TWL LRG LVL3 (GOWN DISPOSABLE) ×6
HANDLE PNEUMATIC FOR CONSTEL (OPHTHALMIC) ×3 IMPLANT
KIT BASIN OR (CUSTOM PROCEDURE TRAY) ×3 IMPLANT
KNIFE GRIESHABER SHARP 2.5MM (MISCELLANEOUS) IMPLANT
MICROPICK 25G (MISCELLANEOUS)
NEEDLE 18GX1X1/2 (RX/OR ONLY) (NEEDLE) ×6 IMPLANT
NEEDLE 25GX 5/8IN NON SAFETY (NEEDLE) IMPLANT
NEEDLE 27GAX1X1/2 (NEEDLE) IMPLANT
NEEDLE FILTER BLUNT 18X 1/2SAF (NEEDLE) ×2
NEEDLE FILTER BLUNT 18X1 1/2 (NEEDLE) ×1 IMPLANT
NEEDLE HYPO 30X.5 LL (NEEDLE) IMPLANT
NS IRRIG 1000ML POUR BTL (IV SOLUTION) ×3 IMPLANT
PACK FRAGMATOME (OPHTHALMIC) IMPLANT
PACK VITRECTOMY CUSTOM (CUSTOM PROCEDURE TRAY) ×3 IMPLANT
PAD ARMBOARD 7.5X6 YLW CONV (MISCELLANEOUS) ×6 IMPLANT
PAK PIK VITRECTOMY CVS 25GA (OPHTHALMIC) ×3 IMPLANT
PIC ILLUMINATED 25G (OPHTHALMIC) ×3
PICK MICROPICK 25G (MISCELLANEOUS) IMPLANT
PIK ILLUMINATED 25G (OPHTHALMIC) ×1 IMPLANT
PROBE LASER ILLUM FLEX CVD 25G (OPHTHALMIC) IMPLANT
REPL STRA BRUSH NEEDLE (NEEDLE) ×3 IMPLANT
RESERVOIR BACK FLUSH (MISCELLANEOUS) ×3 IMPLANT
ROLLS DENTAL (MISCELLANEOUS) ×6 IMPLANT
SCRAPER DIAMOND 25GA (OPHTHALMIC RELATED) IMPLANT
SCRAPER DIAMOND DUST MEMBRANE (MISCELLANEOUS) ×3 IMPLANT
SPONGE SURGIFOAM ABS GEL 12-7 (HEMOSTASIS) ×3 IMPLANT
STOPCOCK 4 WAY LG BORE MALE ST (IV SETS) IMPLANT
SUT CHROMIC 7 0 TG140 8 (SUTURE) IMPLANT
SUT ETHILON 9 0 TG140 8 (SUTURE) ×3 IMPLANT
SUT POLY NON ABSORB 10-0 8 STR (SUTURE) IMPLANT
SUT SILK 4 0 RB 1 (SUTURE) IMPLANT
SUT VICRYL 7 0 TG140 8 (SUTURE) ×3 IMPLANT
SYR 20CC LL (SYRINGE) ×3 IMPLANT
SYR 5ML LL (SYRINGE) IMPLANT
SYR BULB 3OZ (MISCELLANEOUS) ×3 IMPLANT
SYR TB 1ML LUER SLIP (SYRINGE) ×3 IMPLANT
SYRINGE 10CC LL (SYRINGE) IMPLANT
TUBING HIGH PRESS EXTEN 6IN (TUBING) IMPLANT
WATER STERILE IRR 1000ML POUR (IV SOLUTION) ×3 IMPLANT
WIPE INSTRUMENT VISIWIPE 73X73 (MISCELLANEOUS) IMPLANT

## 2014-09-01 NOTE — Brief Op Note (Signed)
Brief Operative note   Preoperative diagnosis:  macular hole left eye Postoperative diagnosis  * No Diagnosis Codes entered *  Procedures: Repair of macular hole with pars plana vitrectomy, membrane peel, serum patch, photocoagulation, gas injection left eye  Surgeon:  Hayden Pedro, MD...  Assistant:  Deatra Ina SA   Anesthesia: General  Specimen: none  Estimated blood loss:  1cc  Complications: none  Patient sent to PACU in good condition  Composed by Hayden Pedro MD  Dictation number: [id number]

## 2014-09-01 NOTE — H&P (Signed)
I examined the patient today and there is no change in the medical status 

## 2014-09-01 NOTE — Anesthesia Procedure Notes (Signed)
Procedure Name: Intubation Date/Time: 09/01/2014 1:33 PM Performed by: Merrilyn Puma B Pre-anesthesia Checklist: Patient identified, Timeout performed, Emergency Drugs available, Suction available and Patient being monitored Patient Re-evaluated:Patient Re-evaluated prior to inductionOxygen Delivery Method: Circle system utilized Preoxygenation: Pre-oxygenation with 100% oxygen Intubation Type: IV induction and Cricoid Pressure applied Ventilation: Mask ventilation without difficulty Laryngoscope Size: Mac and 4 Grade View: Grade III Tube type: Oral Tube size: 7.5 mm Number of attempts: 1 Airway Equipment and Method: Stylet Placement Confirmation: CO2 detector,  positive ETCO2,  ETT inserted through vocal cords under direct vision and breath sounds checked- equal and bilateral Secured at: 23 cm Tube secured with: Tape Dental Injury: Teeth and Oropharynx as per pre-operative assessment

## 2014-09-01 NOTE — Anesthesia Postprocedure Evaluation (Signed)
Anesthesia Post Note  Patient: Lucas Morales  Procedure(s) Performed: Procedure(s) (LRB): 25 GAUGE PARS PLANA VITRECTOMY WITH 20 GAUGE MVR PORT FOR MACULAR HOLE (Left) PHOTOCOAGULATION WITH LASER (Left) SERUM PATCH (Left) MEMBRANE PEEL (Left) GAS/FLUID EXCHANGE (Left) INSERTION OF GAS (Left)  Anesthesia type: General  Patient location: PACU  Post pain: Pain level controlled  Post assessment: Post-op Vital signs reviewed  Last Vitals: BP 133/69 mmHg  Pulse 68  Temp(Src) 36.8 C  Resp 13  SpO2 97%  Post vital signs: Reviewed  Level of consciousness: sedated  Complications: No apparent anesthesia complications

## 2014-09-01 NOTE — Anesthesia Preprocedure Evaluation (Addendum)
Anesthesia Evaluation  Patient identified by MRN, date of birth, ID band Patient awake    Reviewed: Allergy & Precautions, NPO status , Patient's Chart, lab work & pertinent test results, reviewed documented beta blocker date and time   Airway Mallampati: III  TM Distance: >3 FB Neck ROM: Full    Dental  (+) Teeth Intact, Dental Advisory Given   Pulmonary shortness of breath and with exertion, sleep apnea ,  breath sounds clear to auscultation        Cardiovascular hypertension, Pt. on medications and Pt. on home beta blockers + angina + CAD and + CABG + dysrhythmias Rhythm:Regular Rate:Normal     Neuro/Psych PSYCHIATRIC DISORDERS Depression  Neuromuscular disease    GI/Hepatic Neg liver ROS, hiatal hernia, PUD, GERD-  Medicated and Controlled,  Endo/Other  diabetes, Well Controlled  Renal/GU negative Renal ROS     Musculoskeletal  (+) Arthritis -,   Abdominal   Peds  Hematology  (+) anemia ,   Anesthesia Other Findings   Reproductive/Obstetrics                            Anesthesia Physical  Anesthesia Plan  ASA: III  Anesthesia Plan: General   Post-op Pain Management:    Induction: Intravenous  Airway Management Planned: Oral ETT  Additional Equipment:   Intra-op Plan:   Post-operative Plan: Extubation in OR  Informed Consent: I have reviewed the patients History and Physical, chart, labs and discussed the procedure including the risks, benefits and alternatives for the proposed anesthesia with the patient or authorized representative who has indicated his/her understanding and acceptance.   Dental advisory given  Plan Discussed with: CRNA  Anesthesia Plan Comments:        Anesthesia Quick Evaluation

## 2014-09-01 NOTE — Transfer of Care (Signed)
Immediate Anesthesia Transfer of Care Note  Patient: Lucas Morales  Procedure(s) Performed: Procedure(s) with comments: 25 GAUGE PARS PLANA VITRECTOMY WITH 20 GAUGE MVR PORT FOR MACULAR HOLE (Left) PHOTOCOAGULATION WITH LASER (Left) - HEADSCOPE LASER SERUM PATCH (Left) MEMBRANE PEEL (Left) GAS/FLUID EXCHANGE (Left) INSERTION OF GAS (Left) - C3F8  Patient Location: PACU  Anesthesia Type:General  Level of Consciousness: awake and alert   Airway & Oxygen Therapy: Patient Spontanous Breathing and Patient connected to nasal cannula oxygen  Post-op Assessment: Report given to RN, Post -op Vital signs reviewed and stable and Patient moving all extremities X 4  Post vital signs: Reviewed and stable  Last Vitals:  Filed Vitals:   09/01/14 1509  BP: 128/63  Pulse: 102  Temp: 36.8 C  Resp: 16    Complications: No apparent anesthesia complications

## 2014-09-01 NOTE — Progress Notes (Signed)
Pt refused insulin at 2000; CBG=191; Pt educated about hyperglycemic control and still refuses. Will check CBG again at 0000.

## 2014-09-02 ENCOUNTER — Encounter (HOSPITAL_COMMUNITY): Payer: Self-pay | Admitting: Ophthalmology

## 2014-09-02 DIAGNOSIS — H35342 Macular cyst, hole, or pseudohole, left eye: Secondary | ICD-10-CM | POA: Diagnosis not present

## 2014-09-02 LAB — GLUCOSE, CAPILLARY
GLUCOSE-CAPILLARY: 136 mg/dL — AB (ref 65–99)
GLUCOSE-CAPILLARY: 163 mg/dL — AB (ref 65–99)
Glucose-Capillary: 166 mg/dL — ABNORMAL HIGH (ref 65–99)

## 2014-09-02 MED ORDER — PREDNISOLONE ACETATE 1 % OP SUSP: 1.0000 [drp] | Freq: Four times a day (QID) | OPHTHALMIC | Status: DC

## 2014-09-02 MED ORDER — BACITRACIN-POLYMYXIN B 500-10000 UNIT/GM OP OINT: 1.0000 "application " | TOPICAL_OINTMENT | Freq: Three times a day (TID) | OPHTHALMIC | Status: DC

## 2014-09-02 MED ORDER — GATIFLOXACIN 0.5 % OP SOLN: 1.0000 [drp] | Freq: Four times a day (QID) | OPHTHALMIC | Status: DC

## 2014-09-02 NOTE — Progress Notes (Signed)
09/02/2014, 6:46 AM  Mental Status:  Awake, Alert, Oriented  Anterior segment: Cornea  Clear    Anterior Chamber Clear    Lens:    IOL  Intra Ocular Pressure 20 mmHg with Tonopen  Vitreous: Clear 95%gas bubble   Retina:  Attached Good laser reaction   Impression: Excellent result Retina attached   Final Diagnosis: Active Problems:   Macular hole of left eye   Macular hole, left eye   Plan: start post operative eye drops.  Discharge to home.  Give post operative instructions  SARATH, PRIVOTT 09/02/2014, 6:46 AM

## 2014-09-02 NOTE — Op Note (Signed)
NAME:  Lucas Morales, Lucas Morales NO.:  [id number]  MEDICAL RECORD NO.:  [id number]  LOCATION:  6N12C                        FACILITY:  [facility]  PHYSICIAN:  Marquiz Sotelo. Zigmund Daniel, M.D. DATE OF BIRTH:  07-31-1933  DATE OF PROCEDURE:  09/01/2014 DATE OF DISCHARGE:  09/02/2014                              OPERATIVE REPORT   ADMISSION DIAGNOSIS:  Macular hole, left eye.  PROCEDURES:  Pars plana vitrectomy, left eye; membrane peel, left eye; retinal photocoagulation, left eye; serum patch, left eye; gas fluid exchange, left eye; C3F8 injection, left eye.  SURGEON:  Negan Grudzien. Zigmund Daniel, MD  ASSISTANT:  Deatra Ina, SA  ANESTHESIA:  General.  DETAILS:  Usual prep and drape, the indirect ophthalmoscope laser was moved into place.  The peripheral retina was inspected and weak areas were treated with laser, 1320 burns were placed, power was 400 mW, 1000 microns each and 0.1 seconds each.  Attention was carried to the pars plana area where 25-gauge trocars were placed at 10, 2, and 4 o'clock at 1:30 o'clock and 20-gauge MVR incision was made with a 3 layered scleral entry and conjunctival peritomy.  The contact lens was anchored into place at 6 and 12 o'clock.  Provisc placed on the corneal surface.  Flat contact lens was placed.  Pars plana vitrectomy begun just behind the crystalline lens.  Vitreous membranes were encountered and carefully removed under low suction and rapid cutting, the core vitrectomy was carried down to the macular surface where the hole became apparent. There was glistening surface of the retina around the hole.  Once the core vitrectomy was completed, a silicone tip suction line was brought into the vitreous cavity and drawn down to the macular surface with a fish-strike sign occurred.  The posterior hyaloid was engaged and peeled from its attachments to the macula.  The vitrectomy was carried to the mid periphery with a 30 degree prismatic lens, then into the  far periphery down to the vitreous base.  All vitreous was removed in this manner.  The magnifying contact lens was then placed on the corneal surface and view of the macular hole was apparent.  The 20-gauge diamond dusted membrane scraper was used to engage the internal limiting membrane and peel it from its attachments to the edge of the macular hole and 4:1 disc diameter around the macular hole.  The 25-gauge forceps and diamond-dusted membrane scraper were used to peel this and grasped it until all the membrane was removed.  A total gas fluid exchange was carried out at that point.  Sufficient time was allowed for additional fluid to track down the wall of the eye collected in the posterior segment.  Serum patch was prepared during this time, and C3F8 in the 14% concentration.  The additional fluid was removed with a [place] [employer] brush.  The serum patch was delivered.  Additional fluid was removed and C3F8 14% was exchanged for intravitreal gas.  The instruments were removed from the eye and 9-0 nylon was used to close the sclerotomy site 1:30 o'clock.  The conjunctiva was closed with wet- field cautery.  The 25-gauge trocar was removed from the eye.  The wounds were tested and found to  be secured.  Polymyxin and gentamicin were irrigated into tenon space.  Atropine solution was applied. Closing pressure was slightly less than 10 mmHg with a Barraquer tonometer.  Decadron 10 mg was injected into the lower subconjunctival space.  Polymyxin and gentamicin were irrigated into tenon space.  Atropine solution was applied.  Polysporin ophthalmic ointment and patch and shield were placed.  The patient was awakened, and taken to recovery in satisfactory condition.     Chrystie Nose. Zigmund Daniel, M.D.     JDM/MEDQ  D:  09/01/2014  T:  09/02/2014  Job:  [id number]

## 2014-09-02 NOTE — Progress Notes (Signed)
DC home wit wife, pt states he understands DC instructions that Dr. Rodman Key went over eye care with him

## 2014-09-03 ENCOUNTER — Encounter: Payer: Self-pay | Admitting: *Deleted

## 2014-09-08 ENCOUNTER — Encounter (INDEPENDENT_AMBULATORY_CARE_PROVIDER_SITE_OTHER): Payer: Medicare PPO | Admitting: Ophthalmology

## 2014-09-08 DIAGNOSIS — H35342 Macular cyst, hole, or pseudohole, left eye: Secondary | ICD-10-CM

## 2014-09-14 ENCOUNTER — Other Ambulatory Visit: Payer: Self-pay

## 2014-09-14 ENCOUNTER — Telehealth: Payer: Self-pay | Admitting: Cardiovascular Disease

## 2014-09-14 MED ORDER — NITROGLYCERIN 0.4 MG SL SUBL: 0.4000 mg | SUBLINGUAL_TABLET | SUBLINGUAL | Status: DC | PRN

## 2014-09-14 NOTE — Telephone Encounter (Signed)
Danae Chen called in stating that the pt needs a new prescription for his Nitrostat called or faxed to the pharmacy.  The fax number is [phone]   Thanks

## 2014-09-14 NOTE — Telephone Encounter (Signed)
E-SENT MEDICATION  LEFT MESSAGE TO CALL BACK FOR PATIENT-- REMIND PATIENT TO NOT TAKE CIALIS- IF USE NTG

## 2014-09-15 ENCOUNTER — Other Ambulatory Visit: Payer: Self-pay | Admitting: *Deleted

## 2014-09-15 NOTE — Telephone Encounter (Signed)
PATIENT CALLED BACK RN INFORMED PATIENT - HE WILL NOT BE ABLE TO USE NTG SL tablet if ,he uses CIALIS. FOR AT LEAST 24 HOUR PERIOD.  PATIENT STATES HE USE  CIALIS ON A DAILY BASIS.RN INFORMED HIM TO NOT TO USE NTG -WILL DISCUSS WITH DR Gwenlyn Found.   PATIENT APPOINTMENT HAS BEEN MOVED UP TO 09/16/14  FROM 10/29/14  PATIENT WILL DISCUSS WITH DR BERRY AT THAT TIME.

## 2014-09-16 ENCOUNTER — Ambulatory Visit (INDEPENDENT_AMBULATORY_CARE_PROVIDER_SITE_OTHER): Payer: Medicare PPO | Admitting: Cardiovascular Disease

## 2014-09-16 ENCOUNTER — Encounter: Payer: Self-pay | Admitting: Cardiovascular Disease

## 2014-09-16 VITALS — BP 132/78 | HR 68 | Ht 74.0 in | Wt 231.6 lb

## 2014-09-16 DIAGNOSIS — I10 Essential (primary) hypertension: Secondary | ICD-10-CM

## 2014-09-16 DIAGNOSIS — Z951 Presence of aortocoronary bypass graft: Secondary | ICD-10-CM | POA: Diagnosis not present

## 2014-09-16 DIAGNOSIS — E785 Hyperlipidemia, unspecified: Secondary | ICD-10-CM | POA: Diagnosis not present

## 2014-09-16 NOTE — Assessment & Plan Note (Signed)
History of hypertension with blood pressure measured at 132/78. He is on carvedilol and lisinopril. Continue current meds at current dosing

## 2014-09-16 NOTE — Assessment & Plan Note (Signed)
History of hyperlipidemia on atorvastatin 40 mg a day with recent lipid profile performed 04/04/14 revealing total cholesterol 89, LDL 23 and HDL 23

## 2014-09-16 NOTE — Assessment & Plan Note (Signed)
Story of coronary artery disease status post coronary artery bypass grafting by Dr. Prescott Gum  01/18/12  With a LIMA to his LAD, vein graft to the distal LAD, obtuse marginal branches 3 and 4. His postop course was accompanied by PAF for which she was placed on Coumadin, digoxin and amiodarone. These were subsequently stopped after an event monitor showed no recurrence. He subsequent Myoview stress test that was normal as well as a 2-D echo. He currently denies chest pain or shortness of breath.

## 2014-09-16 NOTE — Progress Notes (Signed)
[birth year] Lucas Morales   [birth year]  [id number]  Primary Physician Horatio Pel, MD Primary Cardiologist: Lorretta Harp MD [name]   HPI:  The patient is a 81 year old, moderately overweight, married, Caucasian male, father of two, who I last saw in the office 04/22/13.[name] He has a history of hypertension and hyperlipidemia. He was initially referred to me because of dyspnea. A CT angiogram of his heart performed three years prior showed moderate plaquing in all three coronary arteries with a calcium score of 800, and a 2D echo revealed an EF of 45 to 50%. Because of increasing symptoms of dyspnea on exertion, as well as chest pain radiating to his left upper extremity, a Myoview stress test was performed on January 04, 2012, which showed inferolateral ischemia. Ultimately he was catheterized by Dr. Glenetta Hew on October 24, revealing two vessel disease, and he underwent coronary artery bypass grafting x4 by Dr. Tharon Aquas Trigt on October 31 with a LIMA to his LAD, vein to a distal LAD, obtuse marginal brach 3 and 4. His postoperative course was complicated by PAF, for which he was placed on Coumadin anticoagulation, Digoxin, and amiodarone. When I saw him back in January, he had a prolonged episode of chest pain. A Myoview stress test performed on January 21 was nonischemic. A MCOT showed no evidence of PAF, and as a result, I stopped his amiodarone, Coumadin, and Digoxin. He participated in cardiac rehab.  He saw Tenny Craw [facility] back in the office in March and July. He's been complaining of progressive dyspnea and chest pain. I did order a Myoview stress test and 2-D echocardiogram which revealed normal LV systolic function with severe concentric left hypertrophy. The Myoview showed subtle interval septal ischemia I do not think were significantly abnormal to warrant further evaluation at this time. Since I saw him in February of last year he's remained clinically  stable. His major issues involve his eye and a fractured left hip which was surgically corrected by Dr. [name]. He specifically denies chest pain or shortness of breath.   Current Outpatient Prescriptions  Medication Sig Dispense Refill  . allopurinol (ZYLOPRIM) 300 MG tablet Take 1 tablet by mouth as needed.  4  . aspirin 81 MG tablet Take 81 mg by mouth daily.    [name] atorvastatin (LIPITOR) 40 MG tablet TAKE 1 TABLET BY MOUTH EVERY DAY AT 6 PM 30 tablet 0  . bacitracin-polymyxin b (POLYSPORIN) ophthalmic ointment Place 1 application into the left eye 3 (three) times daily. apply to eye every 12 hours while awake 3.5 g 0  . calcium carbonate (TUMS EX) 750 MG chewable tablet Chew 3-4 tablets by mouth daily.     . carvedilol (COREG) 12.5 MG tablet Take 0.5 tablets by mouth 2 (two) times daily.    . ferrous sulfate 325 (65 FE) MG tablet Take 1 tablet by mouth daily.    [name] FIBER SELECT GUMMIES PO Take 2 tablets by mouth daily.    [name] lisinopril (PRINIVIL,ZESTRIL) 20 MG tablet Take 20 mg by mouth 2 (two) times daily.  3  . loperamide (IMODIUM) 2 MG capsule Take 2 mg by mouth as needed for diarrhea or loose stools.    . Multiple Vitamins-Minerals (MULTIVITAMIN WITH MINERALS) tablet Take 1 tablet by mouth daily.      . naproxen (NAPROSYN) 500 MG tablet Take 500 mg by mouth 3 (three) times daily as needed (pain).     . nitroGLYCERIN (NITROSTAT) 0.4 MG SL tablet  Place 1 tablet (0.4 mg total) under the tongue every 5 (five) minutes as needed for chest pain. 25 tablet 5  . pantoprazole (PROTONIX) 40 MG tablet Take 1 tablet (40 mg total) by mouth 2 (two) times daily. 60 tablet 3  . prednisoLONE acetate (PRED FORTE) 1 % ophthalmic suspension Place 1 drop into the left eye 4 (four) times daily. 5 mL 0  . Saxagliptin-Metformin 2.07-998 MG TB24 Take 1 tablet by mouth daily. Kombiglyze    . tadalafil (CIALIS) 5 MG tablet Take 5 mg by mouth daily. For interstitial cystitis.    [name] vitamin C (ASCORBIC ACID) 500 MG  tablet Take 1 tablet by mouth 2 (two) times daily.      No current facility-administered medications for this visit.    Allergies  Allergen Reactions  . Morphine Itching    History   Social History  . Marital Status: Married    Spouse Name: N/A  . Number of Children: 2  . Years of Education: N/A   Occupational History  . Retired    Social History Main Topics  . Smoking status: Never Smoker   . Smokeless tobacco: Never Used  . Alcohol Use: Yes     Comment: 09/01/2014 "glass of wine a few times/yr; if that"  . Drug Use: No  . Sexual Activity: Yes   Other Topics Concern  . Not on file   Social History Narrative     Review of Systems: General: negative for chills, fever, night sweats or weight changes.  Cardiovascular: negative for chest pain, dyspnea on exertion, edema, orthopnea, palpitations, paroxysmal nocturnal dyspnea or shortness of breath Dermatological: negative for rash Respiratory: negative for cough or wheezing Urologic: negative for hematuria Abdominal: negative for nausea, vomiting, diarrhea, bright red blood per rectum, melena, or hematemesis Neurologic: negative for visual changes, syncope, or dizziness All other systems reviewed and are otherwise negative except as noted above.    Blood pressure 132/78, pulse 68, height 6\' 2"  (1.88 m), weight 231 lb 9.6 oz (105.053 kg).  General appearance: alert and no distress Neck: no adenopathy, no carotid bruit, no JVD, supple, symmetrical, trachea midline and thyroid not enlarged, symmetric, no tenderness/mass/nodules Lungs: clear to auscultation bilaterally Heart: regular rate and rhythm, S1, S2 normal, no murmur, click, rub or gallop Extremities: extremities normal, atraumatic, no cyanosis or edema  EKG not performed today  ASSESSMENT AND PLAN:   S/P CABG x 4, elective, 01/18/12 Story of coronary artery disease status post coronary artery bypass grafting by Dr. Prescott Gum  01/18/12  With a LIMA to his LAD,  vein graft to the distal LAD, obtuse marginal branches 3 and 4. His postop course was accompanied by PAF for which she was placed on Coumadin, digoxin and amiodarone. These were subsequently stopped after an event monitor showed no recurrence. He subsequent Myoview stress test that was normal as well as a 2-D echo. He currently denies chest pain or shortness of breath.  Hypertension History of hypertension with blood pressure measured at 132/78. He is on carvedilol and lisinopril. Continue current meds at current dosing  Hyperlipidemia History of hyperlipidemia on atorvastatin 40 mg a day with recent lipid profile performed 04/04/14 revealing total cholesterol 89, LDL 23 and HDL 23      Lorretta Harp MD [facility], [facility] 09/16/2014 11:40 AM

## 2014-09-16 NOTE — Patient Instructions (Signed)
Your physician wants you to follow-up in: 1 Year. You will receive a reminder letter in the mail two months in advance. If you don't receive a letter, please call our office to schedule the follow-up appointment.  

## 2014-09-18 ENCOUNTER — Telehealth: Payer: Self-pay

## 2014-09-18 NOTE — Telephone Encounter (Signed)
Prior auth for Nitrostat 0.4mg  sl sent to [facility] via Cover My Meds.

## 2014-09-18 NOTE — Telephone Encounter (Signed)
Opened in error

## 2014-09-22 ENCOUNTER — Telehealth: Payer: Self-pay

## 2014-09-22 NOTE — Telephone Encounter (Signed)
Nitrostat 0.4mg  sl approved by [facility].

## 2014-09-24 ENCOUNTER — Other Ambulatory Visit: Payer: Self-pay | Admitting: *Deleted

## 2014-09-24 MED ORDER — ATORVASTATIN CALCIUM 40 MG PO TABS: 40.0000 mg | ORAL_TABLET | Freq: Every day | ORAL | Status: DC

## 2014-09-29 ENCOUNTER — Encounter (INDEPENDENT_AMBULATORY_CARE_PROVIDER_SITE_OTHER): Payer: Medicare PPO | Admitting: Ophthalmology

## 2014-09-29 DIAGNOSIS — H35342 Macular cyst, hole, or pseudohole, left eye: Secondary | ICD-10-CM

## 2014-10-19 ENCOUNTER — Encounter: Payer: Self-pay | Admitting: Cardiovascular Disease

## 2014-10-19 ENCOUNTER — Telehealth: Payer: Self-pay | Admitting: Cardiovascular Disease

## 2014-10-19 ENCOUNTER — Ambulatory Visit (INDEPENDENT_AMBULATORY_CARE_PROVIDER_SITE_OTHER): Payer: Medicare PPO | Admitting: Cardiovascular Disease

## 2014-10-19 VITALS — BP 124/72 | HR 77 | Ht 75.0 in | Wt 233.8 lb

## 2014-10-19 DIAGNOSIS — R079 Chest pain, unspecified: Secondary | ICD-10-CM | POA: Insufficient documentation

## 2014-10-19 DIAGNOSIS — R0789 Other chest pain: Secondary | ICD-10-CM | POA: Diagnosis not present

## 2014-10-19 NOTE — Telephone Encounter (Signed)
Feels his heart is sensitive, without sharp pain but describes a pinprick sensation that goes from his heart to his left scapula.  Discomfort has been there for 6 weeks worse in the morning when he first wakes up.  His home blood pressure is running 150/80-90 which he says his device runs higher than at doctors office  Not any more short of breath than usual.   Scheduled appointment with Dr. Gwenlyn Found at 245pm today.  Patient understands and will be here

## 2014-10-19 NOTE — Progress Notes (Signed)
09[birth year] Lucas Morales   [birth year]  [id number]  Primary Physician Horatio Pel, MD Primary Cardiologist: Lorretta Harp MD [name]   HPI:  The patient is a 81 year old, moderately overweight, married, Caucasian male, father of two, who I last saw in the office 04/22/13.Lucas Morales He has a history of hypertension and hyperlipidemia. He was initially referred to me because of dyspnea. A CT angiogram of his heart performed three years prior showed moderate plaquing in all three coronary arteries with a calcium score of 800, and a 2D echo revealed an EF of 45 to 50%. Because of increasing symptoms of dyspnea on exertion, as well as chest pain radiating to his left upper extremity, a Myoview stress test was performed on January 04, 2012, which showed inferolateral ischemia. Ultimately he was catheterized by Dr. Glenetta Hew on October 24, revealing two vessel disease, and he underwent coronary artery bypass grafting x4 by Dr. Tharon Aquas Trigt on October 31 with a LIMA to his LAD, vein to a distal LAD, obtuse marginal brach 3 and 4. His postoperative course was complicated by PAF, for which he was placed on Coumadin anticoagulation, Digoxin, and amiodarone. When I saw him back in January, he had a prolonged episode of chest pain. A Myoview stress test performed on January 21 was nonischemic. A MCOT showed no evidence of PAF, and as a result, I stopped his amiodarone, Coumadin, and Digoxin. He participated in cardiac rehab.  He saw [name] back in the office in March and July. He's been complaining of progressive dyspnea and chest pain. I did order a Myoview stress test and 2-D echocardiogram which revealed normal LV systolic function with severe concentric left hypertrophy. The Myoview showed subtle interval septal ischemia I do not think were significantly abnormal to warrant further evaluation at this time. Since I saw him in February of last year he's remained clinically  stable. His major issues involve his eye and a fractured left hip which was surgically corrected by Dr. [name].  I saw him in the office one month ago. He developed some atypical chest pain with a pressure component as well as a sharp inframammary constant discomfort rating to his clavicle. His EKG shows no changes. His exam is benign. I've reassured him that this is not cardiac.   Current Outpatient Prescriptions  Medication Sig Dispense Refill  . aspirin 81 MG tablet Take 81 mg by mouth daily.    Lucas Morales atorvastatin (LIPITOR) 40 MG tablet Take 1 tablet (40 mg total) by mouth daily at 6 PM. 30 tablet 10  . calcium carbonate (TUMS EX) 750 MG chewable tablet Chew 3-4 tablets by mouth daily.     . carvedilol (COREG) 12.5 MG tablet Take 0.5 tablets by mouth 2 (two) times daily.    . ferrous sulfate 325 (65 FE) MG tablet Take 1 tablet by mouth daily.    Lucas Morales FIBER SELECT GUMMIES PO Take 2 tablets by mouth daily.    Lucas Morales lisinopril (PRINIVIL,ZESTRIL) 20 MG tablet Take 20 mg by mouth 2 (two) times daily.  3  . loperamide (IMODIUM) 2 MG capsule Take 2 mg by mouth as needed for diarrhea or loose stools.    . Multiple Vitamins-Minerals (MULTIVITAMIN WITH MINERALS) tablet Take 1 tablet by mouth daily.      . naproxen (NAPROSYN) 500 MG tablet Take 500 mg by mouth 3 (three) times daily as needed (pain).     . pantoprazole (PROTONIX) 40 MG tablet Take 1 tablet (40  mg total) by mouth 2 (two) times daily. 60 tablet 3  . Saxagliptin-Metformin 2.07-998 MG TB24 Take 1 tablet by mouth daily. Kombiglyze    . tadalafil (CIALIS) 5 MG tablet Take 5 mg by mouth daily. For interstitial cystitis.    Lucas Morales vitamin C (ASCORBIC ACID) 500 MG tablet Take 1 tablet by mouth 2 (two) times daily.      No current facility-administered medications for this visit.    Allergies  Allergen Reactions  . Morphine Itching    History   Social History  . Marital Status: Married    Spouse Name: N/A  . Number of Children: 2  . Years of  Education: N/A   Occupational History  . Retired    Social History Main Topics  . Smoking status: Never Smoker   . Smokeless tobacco: Never Used  . Alcohol Use: Yes     Comment: 09/01/2014 "glass of wine a few times/yr; if that"  . Drug Use: No  . Sexual Activity: Yes   Other Topics Concern  . Not on file   Social History Narrative     Review of Systems: General: negative for chills, fever, night sweats or weight changes.  Cardiovascular: negative for chest pain, dyspnea on exertion, edema, orthopnea, palpitations, paroxysmal nocturnal dyspnea or shortness of breath Dermatological: negative for rash Respiratory: negative for cough or wheezing Urologic: negative for hematuria Abdominal: negative for nausea, vomiting, diarrhea, bright red blood per rectum, melena, or hematemesis Neurologic: negative for visual changes, syncope, or dizziness All other systems reviewed and are otherwise negative except as noted above.    Blood pressure 124/72, pulse 77, height 6\' 3"  (1.905 m), weight 233 lb 12.8 oz (106.051 kg).  General appearance: alert and no distress Neck: no adenopathy, no carotid bruit, no JVD, supple, symmetrical, trachea midline and thyroid not enlarged, symmetric, no tenderness/mass/nodules Lungs: clear to auscultation bilaterally Heart: regular rate and rhythm, S1, S2 normal, no murmur, click, rub or gallop Extremities: extremities normal, atraumatic, no cyanosis or edema  EKG normal sinus rhythm at 77 without ST or T-wave changes. I personally reviewed this EKG  ASSESSMENT AND PLAN:   Atypical chest pain Mr. [name]  presents today after last being seen a month ago for atypical chest pain. He has a remote history of coronary artery disease as outlined in previous notes. When I saw him a month ago he really was asymptomatic. He complains of a constant discomfort in his precordial area with a constant sharp pain that is inframammary waiting to his clavicle. His exam  is benign. His EKG shows no acute change. I've reassured him that his pain is not cardiac and most likely musculoskeletal. I will see him back in 6 months.      Lorretta Harp MD FACP,FACC,FAHA, [facility] 10/19/2014 3:25 PM

## 2014-10-19 NOTE — Patient Instructions (Signed)
Your physician wants you to follow-up in: 6 Months You will receive a reminder letter in the mail two months in advance. If you don't receive a letter, please call our office to schedule the follow-up appointment.  

## 2014-10-19 NOTE — Assessment & Plan Note (Signed)
Lucas Morales  presents today after last being seen a month ago for atypical chest pain. He has a remote history of coronary artery disease as outlined in previous notes. When I saw him a month ago he really was asymptomatic. He complains of a constant discomfort in his precordial area with a constant sharp pain that is inframammary waiting to his clavicle. His exam is benign. His EKG shows no acute change. I've reassured him that his pain is not cardiac and most likely musculoskeletal. I will see him back in 6 months.

## 2014-10-19 NOTE — Telephone Encounter (Signed)
Calling because he is having some burning stinging feeling around his heart which is going out towards the shoulder blade . Please call   Thanks

## 2014-10-29 ENCOUNTER — Ambulatory Visit: Payer: Medicare PPO | Admitting: Cardiovascular Disease

## 2014-11-10 ENCOUNTER — Encounter (INDEPENDENT_AMBULATORY_CARE_PROVIDER_SITE_OTHER): Payer: Medicare PPO | Admitting: Ophthalmology

## 2014-11-10 DIAGNOSIS — H35342 Macular cyst, hole, or pseudohole, left eye: Secondary | ICD-10-CM

## 2014-12-09 ENCOUNTER — Encounter (INDEPENDENT_AMBULATORY_CARE_PROVIDER_SITE_OTHER): Payer: Medicare PPO | Admitting: Ophthalmology

## 2014-12-09 DIAGNOSIS — H35343 Macular cyst, hole, or pseudohole, bilateral: Secondary | ICD-10-CM | POA: Diagnosis not present

## 2014-12-09 DIAGNOSIS — I10 Essential (primary) hypertension: Secondary | ICD-10-CM

## 2014-12-09 DIAGNOSIS — H43813 Vitreous degeneration, bilateral: Secondary | ICD-10-CM

## 2014-12-09 DIAGNOSIS — H35033 Hypertensive retinopathy, bilateral: Secondary | ICD-10-CM | POA: Diagnosis not present

## 2015-04-12 ENCOUNTER — Ambulatory Visit (INDEPENDENT_AMBULATORY_CARE_PROVIDER_SITE_OTHER): Payer: Medicare Other | Admitting: Ophthalmology

## 2015-04-12 DIAGNOSIS — H35033 Hypertensive retinopathy, bilateral: Secondary | ICD-10-CM

## 2015-04-12 DIAGNOSIS — H35343 Macular cyst, hole, or pseudohole, bilateral: Secondary | ICD-10-CM

## 2015-04-12 DIAGNOSIS — I10 Essential (primary) hypertension: Secondary | ICD-10-CM | POA: Diagnosis not present

## 2015-06-23 ENCOUNTER — Encounter: Payer: Self-pay | Admitting: Oncology

## 2015-06-23 ENCOUNTER — Telehealth: Payer: Self-pay | Admitting: Oncology

## 2015-06-23 ENCOUNTER — Encounter: Payer: Self-pay | Admitting: Cardiovascular Disease

## 2015-06-23 ENCOUNTER — Encounter: Payer: Self-pay | Admitting: *Deleted

## 2015-06-23 ENCOUNTER — Ambulatory Visit (INDEPENDENT_AMBULATORY_CARE_PROVIDER_SITE_OTHER): Payer: Medicare Other | Admitting: Cardiovascular Disease

## 2015-06-23 VITALS — BP 122/66 | HR 73 | Ht 75.0 in | Wt 242.2 lb

## 2015-06-23 DIAGNOSIS — I2510 Atherosclerotic heart disease of native coronary artery without angina pectoris: Secondary | ICD-10-CM

## 2015-06-23 DIAGNOSIS — I10 Essential (primary) hypertension: Secondary | ICD-10-CM

## 2015-06-23 DIAGNOSIS — I4891 Unspecified atrial fibrillation: Secondary | ICD-10-CM

## 2015-06-23 DIAGNOSIS — I159 Secondary hypertension, unspecified: Secondary | ICD-10-CM

## 2015-06-23 DIAGNOSIS — I2583 Coronary atherosclerosis due to lipid rich plaque: Secondary | ICD-10-CM

## 2015-06-23 NOTE — Progress Notes (Signed)
0000000 Lucas Morales   [id number]  [id number]  Primary Physician Horatio Pel, MD Primary Cardiologist: Lorretta Harp MD [name]   HPI:  The patient is a 82 year old, moderately overweight, married, Caucasian male, father of two, who I last saw in the office 10/19/14... He has a history of hypertension and hyperlipidemia. He was initially referred to me because of dyspnea. A CT angiogram of his heart performed three years prior showed moderate plaquing in all three coronary arteries with a calcium score of 800, and a 2D echo revealed an EF of 45 to 50%. Because of increasing symptoms of dyspnea on exertion, as well as chest pain radiating to his left upper extremity, a Myoview stress test was performed on January 04, 2012, which showed inferolateral ischemia. Ultimately he was catheterized by Dr. Glenetta Hew on October 24, revealing two vessel disease, and he underwent coronary artery bypass grafting x4 by Dr. Tharon Aquas Trigt on October 31 with a LIMA to his LAD, vein to a distal LAD, obtuse marginal brach 3 and 4. His postoperative course was complicated by PAF, for which he was placed on Coumadin anticoagulation, Digoxin, and amiodarone. When I saw him back in January, he had a prolonged episode of chest pain. A Myoview stress test performed on January 21 was nonischemic. A MCOT showed no evidence of PAF, and as a result, I stopped his amiodarone, Coumadin, and Digoxin. He participated in cardiac rehab.  He saw Tenny Craw [facility] back in the office in March and July. He's been complaining of progressive dyspnea and chest pain. I did order a Myoview stress test and 2-D echocardiogram which revealed normal LV systolic function with severe concentric left hypertrophy. The Myoview showed subtle interval septal ischemia I do not think were significantly abnormal to warrant further evaluation at this time. Since I saw him in the office back in August 2016 he remained  clinically stable. He denies chest pain appears chronically short of breath. He does complain of myalgias which may be statin related. We will institute a two-month statin holiday. He clearly needs revision of his left hip which I think he can have a low perioperative cardiovascular risk.    Current Outpatient Prescriptions  Medication Sig Dispense Refill  . aspirin 81 MG tablet Take 81 mg by mouth daily.    [name] atorvastatin (LIPITOR) 40 MG tablet Take 1 tablet (40 mg total) by mouth daily at 6 PM. 30 tablet 10  . calcium carbonate (TUMS EX) 750 MG chewable tablet Chew 3-4 tablets by mouth daily.     . carvedilol (COREG) 12.5 MG tablet Take 0.5 tablets by mouth 2 (two) times daily.    [name] FIBER SELECT GUMMIES PO Take 2 tablets by mouth daily.    [name] loperamide (IMODIUM) 2 MG capsule Take 2 mg by mouth as needed for diarrhea or loose stools.    . Multiple Vitamins-Minerals (MULTIVITAMIN WITH MINERALS) tablet Take 1 tablet by mouth daily.      . naproxen (NAPROSYN) 500 MG tablet Take 500 mg by mouth 3 (three) times daily as needed (pain).     [name] OVER THE COUNTER MEDICATION CICLOPINOX 8% EXTERNALLY    . OXYCODONE HCL PO Take 5 mg by mouth daily as needed.    . pantoprazole (PROTONIX) 40 MG tablet Take 1 tablet (40 mg total) by mouth 2 (two) times daily. 60 tablet 3  . ranitidine (ZANTAC) 150 MG tablet Take 150 mg by mouth daily.    [name]  Saxagliptin-Metformin 2.07-998 MG TB24 Take 1 tablet by mouth daily. Kombiglyze    . tadalafil (CIALIS) 5 MG tablet Take 5 mg by mouth daily. For interstitial cystitis.    [name] vitamin C (ASCORBIC ACID) 500 MG tablet Take 1 tablet by mouth 2 (two) times daily.      No current facility-administered medications for this visit.    Allergies  Allergen Reactions  . Morphine Itching    Social History   Social History  . Marital Status: Married    Spouse Name: N/A  . Number of Children: 2  . Years of Education: N/A   Occupational History  . Retired    Social History  Main Topics  . Smoking status: Never Smoker   . Smokeless tobacco: Never Used  . Alcohol Use: Yes     Comment: 09/01/2014 "glass of wine a few times/yr; if that"  . Drug Use: No  . Sexual Activity: Yes   Other Topics Concern  . Not on file   Social History Narrative     Review of Systems: General: negative for chills, fever, night sweats or weight changes.  Cardiovascular: negative for chest pain, dyspnea on exertion, edema, orthopnea, palpitations, paroxysmal nocturnal dyspnea or shortness of breath Dermatological: negative for rash Respiratory: negative for cough or wheezing Urologic: negative for hematuria Abdominal: negative for nausea, vomiting, diarrhea, bright red blood per rectum, melena, or hematemesis Neurologic: negative for visual changes, syncope, or dizziness All other systems reviewed and are otherwise negative except as noted above.    Blood pressure 122/66, pulse 73, height 6\' 3"  (1.905 m), weight 242 lb 3.2 oz (109.861 kg).  General appearance: alert and no distress Neck: no adenopathy, no carotid bruit, no JVD, supple, symmetrical, trachea midline and thyroid not enlarged, symmetric, no tenderness/mass/nodules Lungs: clear to auscultation bilaterally Heart: regular rate and rhythm, S1, S2 normal, no murmur, click, rub or gallop Extremities: extremities normal, atraumatic, no cyanosis or edema  EKG normal sinus rhythm at 73 with inferior Q waves. Approximately this EKG  ASSESSMENT AND PLAN:   Hypertension History of hypertension blood pressure measurements at 122/66. He is on carvedilol. Continue current meds at current dosing  Hyperlipidemia History of hyperlipidemia on statin therapy followed by his PCP. He does relate a history of myalgias. We talked about giving him a several months statin holiday  and reevaluate at that time.  CAD -- mLAD 80&-60%, OM2 90%, OM3 70-80%, RPDA 90%, cath 01/11/12 History of CAD status post bypass grafting X 4 01/11/12 by  Dr. Tharon Aquas Trigt with a LIMA to his LAD, vein to the distal LAD, obtuse marginal branch is 3 and 4. His last Myoview performed 04/01/13 was low risk with minimal ischemia. The patient denies chest pain and is chronically short of breath. He is going to require an elective right hip reoperation which I think he can have a low perioperative cardiovascular risk without the need for functional study.  Atrial fibrillation with rapid ventricular response; New onset, post-op CABG History of perioperative PAF maintaining sinus rhythm not on an antiarrhythmic medication or an anticoagulant      Lorretta Harp MD [facility], [facility] 06/23/2015 2:42 PM

## 2015-06-23 NOTE — Assessment & Plan Note (Signed)
History of hypertension blood pressure measurements at 122/66. He is on carvedilol. Continue current meds at current dosing

## 2015-06-23 NOTE — Assessment & Plan Note (Signed)
History of perioperative PAF maintaining sinus rhythm not on an antiarrhythmic medication or an anticoagulant

## 2015-06-23 NOTE — Telephone Encounter (Signed)
Verified insurance and address, faxed referring provider letter, mailed new pt packet, scheduled intake

## 2015-06-23 NOTE — Patient Instructions (Addendum)
Medication Instructions:  Your physician has recommended you make the following change in your medication:  1- HOLD LIPITOR FOR 2 MONTHS TO EVALUATE LEG CRAMPING.   Labwork: NONE    Testing/Procedures: NONE  Follow-Up: Your physician wants you to follow-up in: 12 MONTHS. You will receive a reminder letter in the mail two months in advance. If you don't receive a letter, please call our office to schedule the follow-up appointment.   Any Other Special Instructions Will Be Listed Below (If Applicable).     If you need a refill on your cardiac medications before your next appointment, please call your pharmacy.

## 2015-06-23 NOTE — Assessment & Plan Note (Addendum)
History of hyperlipidemia on statin therapy followed by his PCP. He does relate a history of myalgias. We talked about giving him a several months statin holiday  and reevaluate at that time.

## 2015-06-23 NOTE — Assessment & Plan Note (Addendum)
History of CAD status post bypass grafting X 4 01/11/12 by Dr. Tharon Aquas Trigt with a LIMA to his LAD, vein to the distal LAD, obtuse marginal branch is 3 and 4. His last Myoview performed 04/01/13 was low risk with minimal ischemia. The patient denies chest pain and is chronically short of breath. He is going to require an elective right hip reoperation which I think he can have a low perioperative cardiovascular risk without the need for functional study.

## 2015-06-30 ENCOUNTER — Ambulatory Visit (HOSPITAL_BASED_OUTPATIENT_CLINIC_OR_DEPARTMENT_OTHER): Payer: Medicare Other | Admitting: Oncology

## 2015-06-30 ENCOUNTER — Telehealth: Payer: Self-pay | Admitting: Oncology

## 2015-06-30 VITALS — BP 145/63 | HR 78 | Temp 97.9°F | Resp 18 | Wt 246.2 lb

## 2015-06-30 DIAGNOSIS — D539 Nutritional anemia, unspecified: Secondary | ICD-10-CM | POA: Diagnosis not present

## 2015-06-30 DIAGNOSIS — D649 Anemia, unspecified: Secondary | ICD-10-CM

## 2015-06-30 NOTE — Progress Notes (Signed)
Please see consult note.  

## 2015-06-30 NOTE — Telephone Encounter (Signed)
per pofto sch pt appt-cld pt and left a message of time & date of appt

## 2015-06-30 NOTE — Consult Note (Signed)
Reason for Referral: Anemia   HPI: 82 year old gentleman currently of [place] where he lived for over 69 years. He gentleman with comorbid conditions include hypertension, coronary disease and degenerative arthritis. He is currently dealing with that neck pain and hip pain and under consideration for possible surgery regarding these issues. He does have a prosthesis which could potentially be causing some of his problems. He was noted due to be anemic her periodically since 2011. His hemoglobin have ranged between 11 and 12 and has drifted up for because of acute GI bleed in 2016. His most recent CBC done on 05/20/2015 showed a hemoglobin of 10.6 white cell count of 5.0 and a platelet count of 174. His MCV was 103 and his RDW is normal at 14. His white cell count differential was normal. His vitamin B12 level was normal at 765. His creatinine is 1.3 with a creatinine clearance of less than 60 mL/m around 56. He had normal liver function test, bilirubin, total protein and albumin. He is relatively asymptomatic from his anemia he does report dyspnea which is chronic in nature and is unchanged. He does have some fatigue related to gastritis but does not interfere with his function. He is able to drive and attends to activities of daily living. He exercises regularly predominantly water aerobics. He denied any hematochezia or melena. He denied any hemoptysis or hematemesis. Has not reported any hematuria.  He does not report any headaches, blurry vision, syncope or seizures. He does not report any fevers, chills, sweats or weight loss. His appetite remain excellent and have gained weight. He does not report any chest pain, palpitation, orthopnea or leg edema. He does not report any cough, wheezing or hemoptysis. Does not report any nausea, vomiting, abdominal pain. Does not report any hematochezia or melena. He does not report any frequency, urgency or hesitancy. He does report chronic neck and hip pain which  is unchanged. Remaining review of systems unremarkable.   Past Medical History  Diagnosis Date  . Adenomatous colon polyp   . Hemorrhoids   . Diverticulosis   . Gastritis   . IC (interstitial cystitis)   . Esophageal stricture   . Diverticulitis   . Hyperlipidemia     doesn't require meds  . Coronary artery disease   . Hypertension     takes Losartan daily  . Joint pain   . Joint swelling   . GERD (gastroesophageal reflux disease)     takes Pantoprazole daily  . History of colon polyps   . Urinary frequency   . Nocturia   . Insomnia     takes Nortrypyline nightly  . Hiatal hernia   . Esophageal dysmotility   . Paroxysmal atrial fibrillation (HCC)   . Dysrhythmia     bradycardia in the 30's (01/11/2012).. Afib briefly after CABG  . Gout   . Dyspnea on exertion   . OSA (obstructive sleep apnea)     "mild; tried CPAP; couldn't tolerate" (09/01/2014)  . Type II diabetes mellitus ([place])     "borderline"  . Chronic bronchitis ([place])     "get it pretty much q yr" (09/01/2014)  . Bruises easily   . Arthritis     "knees; bad in my back" (09/01/2014)  . Chronic lower back pain   . Sciatic nerve pain   . Depression     "related to health" (09/01/2014)  :  Past Surgical History  Procedure Laterality Date  . Reconstruction medial collateral ligament elbow w/ tendon graft Right 1988  X 2  . Total knee arthroplasty Bilateral ~ 2001; 2011    right; left  . Bladder lesion removed    . Exploratory laparotomy  2009    with right colectomy  . Tonsillectomy and adenoidectomy  ~1943  . Appendectomy  2009  . Cholecystectomy  2009  . Lumbar disc surgery  ?2010    "cleaned out the stenosis" (01/11/2012)  . Cystoscopy with ureteroscopy  2008; 2009    "painted inside of bladder wall"  . Cataract extraction w/ intraocular lens  implant, bilateral Bilateral 2011  . Tumor excision Left 12/2011    arm  . Elbow surgery Right 1988    "reconstructive OR"  . Partial colectomy      d/t  diverticulosis  . Heel spur excision Bilateral 1970's  . Colonoscopy    . Esophagogastroduodenoscopy    . Coronary artery bypass graft  01/18/2012    Procedure: CORONARY ARTERY BYPASS GRAFTING (CABG);  Surgeon: Ivin Poot, MD;  Location: [facility];  Service: Open Heart Surgery;  Laterality: N/A;  times four, using left internal mammary artery  . Endovein harvest of greater saphenous vein  01/18/2012    Procedure: ENDOVEIN HARVEST OF GREATER SAPHENOUS VEIN;  Surgeon: Ivin Poot, MD;  Location: [facility];  Service: Open Heart Surgery;  Laterality: Right;  . Cardiac catheterization  01/11/2012  . Joint replacement    . 25 gauge pars plana vitrectomy with 20 gauge mvr port for macular hole Right 10/08/2012    Procedure: 25 GAUGE PARS PLANA VITRECTOMY WITH 20 GAUGE MVR PORT FOR MACULAR HOLE; Membrame peel, serum patch; Laser treatment ; Gas exchange;  Surgeon: Hayden Pedro, MD;  Location: [facility];  Service: Ophthalmology;  Laterality: Right;  . Left heart catheterization with coronary angiogram N/A 01/11/2012    Procedure: LEFT HEART CATHETERIZATION WITH CORONARY ANGIOGRAM;  Surgeon: Leonie Man, MD;  Location: [facility] CATH LAB;  Service: Cardiovascular;  Laterality: N/A;  . Compression hip screw Left 04/04/2014    Procedure: COMPRESSION HIP LEFT;  Surgeon: Mcarthur Rossetti, MD;  Location: [facility];  Service: Orthopedics;  Laterality: Left;  . Esophagogastroduodenoscopy N/A 06/05/2014    Procedure: ESOPHAGOGASTRODUODENOSCOPY (EGD);  Surgeon: Irene Shipper, MD;  Location: [facility];  Service: Endoscopy;  Laterality: N/A;  . Pars plana vitrectomy w/ repair of macular hole Left 09/01/2014  . Fracture surgery    . Intertrochanteric hip fracture surgery Left 04/04/2014  . Coronary angioplasty    . 25 gauge pars plana vitrectomy with 20 gauge mvr port for macular hole Left 09/01/2014    Procedure: 69 GAUGE PARS PLANA VITRECTOMY WITH 20 GAUGE MVR PORT FOR MACULAR HOLE;  Surgeon: Hayden Pedro, MD;   Location: [place];  Service: Ophthalmology;  Laterality: Left;  . Photocoagulation with laser Left 09/01/2014    Procedure: PHOTOCOAGULATION WITH LASER;  Surgeon: Hayden Pedro, MD;  Location: [place];  Service: Ophthalmology;  Laterality: Left;  HEADSCOPE LASER  . Serum patch Left 09/01/2014    Procedure: SERUM PATCH;  Surgeon: Hayden Pedro, MD;  Location: [place];  Service: Ophthalmology;  Laterality: Left;  [name] Membrane peel Left 09/01/2014    Procedure: MEMBRANE PEEL;  Surgeon: Hayden Pedro, MD;  Location: [place];  Service: Ophthalmology;  Laterality: Left;  . Gas/fluid exchange Left 09/01/2014    Procedure: GAS/FLUID EXCHANGE;  Surgeon: Hayden Pedro, MD;  Location: [place];  Service: Ophthalmology;  Laterality: Left;  . Gas insertion Left 09/01/2014  Procedure: INSERTION OF GAS;  Surgeon: Hayden Pedro, MD;  Location: [place];  Service: Ophthalmology;  Laterality: Left;  [id number]  . Transthoracic echocardiogram  01/04/2012    MODERATE SEVERE-SEVERE LVH. EF=>55%. MILDLY IMPAIRED LV RELAXATION. LA IS MODERATE TO SEVERE DILATED. MODERATE MR. MV LEAFLETS APPEAR THICHENED.  [name] Nm myocar multiple w/spect  04/09/2012    NORMAL STRESS NUCLEAR STUDY. EF 51%.  :   Current outpatient prescriptions:  .  aspirin 81 MG tablet, Take 81 mg by mouth daily., Disp: , Rfl:  .  atorvastatin (LIPITOR) 40 MG tablet, Take 1 tablet (40 mg total) by mouth daily at 6 PM., Disp: 30 tablet, Rfl: 10 .  calcium carbonate (TUMS EX) 750 MG chewable tablet, Chew 3-4 tablets by mouth daily. , Disp: , Rfl:  .  carvedilol (COREG) 12.5 MG tablet, Take 0.5 tablets by mouth 2 (two) times daily., Disp: , Rfl:  .  FIBER SELECT GUMMIES PO, Take 2 tablets by mouth daily., Disp: , Rfl:  .  loperamide (IMODIUM) 2 MG capsule, Take 2 mg by mouth as needed for diarrhea or loose stools., Disp: , Rfl:  .  Multiple Vitamins-Minerals (MULTIVITAMIN WITH MINERALS) tablet, Take 1 tablet by mouth daily.  , Disp: , Rfl:  .  naproxen (NAPROSYN) 500 MG  tablet, Take 500 mg by mouth 3 (three) times daily as needed (pain). , Disp: , Rfl:  .  OVER THE COUNTER MEDICATION, CICLOPINOX 8% EXTERNALLY, Disp: , Rfl:  .  OXYCODONE HCL PO, Take 5 mg by mouth daily as needed., Disp: , Rfl:  .  pantoprazole (PROTONIX) 40 MG tablet, Take 1 tablet (40 mg total) by mouth 2 (two) times daily., Disp: 60 tablet, Rfl: 3 .  ranitidine (ZANTAC) 150 MG tablet, Take 150 mg by mouth daily., Disp: , Rfl:  .  Saxagliptin-Metformin 2.07-998 MG TB24, Take 1 tablet by mouth daily. Kombiglyze, Disp: , Rfl:  .  tadalafil (CIALIS) 5 MG tablet, Take 5 mg by mouth daily. For interstitial cystitis., Disp: , Rfl:  .  vitamin C (ASCORBIC ACID) 500 MG tablet, Take 1 tablet by mouth 2 (two) times daily. , Disp: , Rfl: :  Allergies  Allergen Reactions  . Morphine Itching  :  Family History  Problem Relation Age of Onset  . Colon cancer Neg Hx   . Kidney disease Father   . Emphysema Mother   . Emphysema Maternal Grandfather   :  Social History   Social History  . Marital Status: Married    Spouse Name: N/A  . Number of Children: 2  . Years of Education: N/A   Occupational History  . Retired    Social History Main Topics  . Smoking status: Never Smoker   . Smokeless tobacco: Never Used  . Alcohol Use: Yes     Comment: 09/01/2014 "glass of wine a few times/yr; if that"  . Drug Use: No  . Sexual Activity: Yes   Other Topics Concern  . Not on file   Social History Narrative  :  Pertinent items are noted in HPI.  Exam: Blood pressure 145/63, pulse 78, temperature 97.9 F (36.6 C), temperature source Oral, resp. rate 18, weight 246 lb 3.2 oz (111.676 kg), SpO2 99 %. General appearance: alert and cooperative Head: Normocephalic, without obvious abnormality Throat: lips, mucosa, and tongue normal; teeth and gums normal Neck: no adenopathy Back: negative Resp: clear to auscultation bilaterally Chest wall: no tenderness Cardio: regular rate and rhythm, S1, S2  normal, no  murmur, click, rub or gallop GI: soft, non-tender; bowel sounds normal; no masses,  no organomegaly Extremities: extremities normal, atraumatic, no cyanosis or edema Pulses: 2+ and symmetric Skin: Skin color, texture, turgor normal. No rashes or lesions Lymph nodes: Cervical, supraclavicular, and axillary nodes normal.  CBC    Component Value Date/Time   WBC 4.6 09/01/2014 1016   RBC 3.88* 09/01/2014 1016   HGB 12.1* 09/01/2014 1016   HCT 35.2* 09/01/2014 1016   PLT 122* 09/01/2014 1016   MCV 90.7 09/01/2014 1016   MCH 31.2 09/01/2014 1016   MCHC 34.4 09/01/2014 1016   RDW 15.8* 09/01/2014 1016   LYMPHSABS 1.6 06/04/2014 1559   MONOABS 0.6 06/04/2014 1559   EOSABS 0.0 06/04/2014 1559   BASOSABS 0.0 06/04/2014 1559      Assessment and Plan:   82 year old gentleman with the following issues  1. Macrocytic anemia that have been now fluctuating periodically. He did have an element of GI bleed in the past and have been treated with oral iron periodically. His most recent hemoglobin showed a hemoglobin of 10.6 with an MCV 103.  The differential diagnosis was discussed with the patient today. Conditions such as MDS, plasma cell disorder, and B12 deficiency are the most common causes for macrocytosis. His B12 levels are normal and there is no clear-cut sign of plasma cell disorder. Low-grade MDS is always a possibility and somebody in his age group.  From a management standpoint, his hemoglobin is very mild and asymptomatic. I recommended a period of observation and complete his workup with obtaining serum protein electrophoresis, iron studies, and erythropoietin. I also discussed with him the role of a bone marrow biopsy and potentially growth factor support with Procrit if he becomes symptomatic or his hemoglobin drifts down further.  The plan is to proceed with a period of observation and repeat laboratory testing in 2 months.  2. Follow-up: Will be in June 2017 after  repeat laboratory testing.

## 2015-07-08 ENCOUNTER — Other Ambulatory Visit: Payer: Self-pay | Admitting: Orthopaedic Surgery

## 2015-07-08 DIAGNOSIS — M542 Cervicalgia: Secondary | ICD-10-CM

## 2015-07-15 ENCOUNTER — Ambulatory Visit
Admission: RE | Admit: 2015-07-15 | Discharge: 2015-07-15 | Disposition: A | Discharge status: Home / Self Care | Payer: Medicare Other | Source: Ambulatory Visit | Attending: Orthopaedic Surgery | Admitting: Orthopaedic Surgery

## 2015-07-15 DIAGNOSIS — M542 Cervicalgia: Secondary | ICD-10-CM

## 2015-07-26 ENCOUNTER — Other Ambulatory Visit: Payer: Self-pay | Admitting: Orthopaedic Surgery

## 2015-07-26 ENCOUNTER — Other Ambulatory Visit: Payer: Self-pay | Admitting: Physician Assistant

## 2015-07-27 ENCOUNTER — Encounter (HOSPITAL_COMMUNITY): Payer: Self-pay

## 2015-07-27 ENCOUNTER — Encounter (HOSPITAL_COMMUNITY)
Admission: RE | Admit: 2015-07-27 | Discharge: 2015-07-27 | Disposition: A | Discharge status: Home / Self Care | Payer: Medicare Other | Source: Ambulatory Visit | Attending: Orthopaedic Surgery | Admitting: Orthopaedic Surgery

## 2015-07-27 DIAGNOSIS — E119 Type 2 diabetes mellitus without complications: Secondary | ICD-10-CM | POA: Diagnosis not present

## 2015-07-27 DIAGNOSIS — Z951 Presence of aortocoronary bypass graft: Secondary | ICD-10-CM | POA: Diagnosis not present

## 2015-07-27 DIAGNOSIS — Z96653 Presence of artificial knee joint, bilateral: Secondary | ICD-10-CM | POA: Diagnosis not present

## 2015-07-27 DIAGNOSIS — I480 Paroxysmal atrial fibrillation: Secondary | ICD-10-CM | POA: Diagnosis not present

## 2015-07-27 DIAGNOSIS — M25552 Pain in left hip: Secondary | ICD-10-CM | POA: Diagnosis present

## 2015-07-27 DIAGNOSIS — T8484XA Pain due to internal orthopedic prosthetic devices, implants and grafts, initial encounter: Secondary | ICD-10-CM | POA: Diagnosis not present

## 2015-07-27 DIAGNOSIS — M7062 Trochanteric bursitis, left hip: Secondary | ICD-10-CM | POA: Diagnosis not present

## 2015-07-27 DIAGNOSIS — Y838 Other surgical procedures as the cause of abnormal reaction of the patient, or of later complication, without mention of misadventure at the time of the procedure: Secondary | ICD-10-CM | POA: Diagnosis not present

## 2015-07-27 DIAGNOSIS — Z9049 Acquired absence of other specified parts of digestive tract: Secondary | ICD-10-CM | POA: Diagnosis not present

## 2015-07-27 DIAGNOSIS — Z9989 Dependence on other enabling machines and devices: Secondary | ICD-10-CM | POA: Diagnosis not present

## 2015-07-27 DIAGNOSIS — K449 Diaphragmatic hernia without obstruction or gangrene: Secondary | ICD-10-CM | POA: Diagnosis not present

## 2015-07-27 DIAGNOSIS — I2510 Atherosclerotic heart disease of native coronary artery without angina pectoris: Secondary | ICD-10-CM | POA: Diagnosis not present

## 2015-07-27 DIAGNOSIS — M1990 Unspecified osteoarthritis, unspecified site: Secondary | ICD-10-CM | POA: Diagnosis not present

## 2015-07-27 DIAGNOSIS — I10 Essential (primary) hypertension: Secondary | ICD-10-CM | POA: Diagnosis not present

## 2015-07-27 DIAGNOSIS — G4733 Obstructive sleep apnea (adult) (pediatric): Secondary | ICD-10-CM | POA: Diagnosis not present

## 2015-07-27 DIAGNOSIS — K219 Gastro-esophageal reflux disease without esophagitis: Secondary | ICD-10-CM | POA: Diagnosis not present

## 2015-07-27 HISTORY — DX: Anxiety disorder, unspecified: F41.9

## 2015-07-27 HISTORY — DX: Alpha-1-antitrypsin deficiency: E88.01

## 2015-07-27 LAB — BASIC METABOLIC PANEL
Anion gap: 11 (ref 5–15)
BUN: 23 mg/dL — ABNORMAL HIGH (ref 6–20)
CALCIUM: 9.4 mg/dL (ref 8.9–10.3)
CO2: 21 mmol/L — ABNORMAL LOW (ref 22–32)
CREATININE: 1.08 mg/dL (ref 0.61–1.24)
Chloride: 108 mmol/L (ref 101–111)
Glucose, Bld: 130 mg/dL — ABNORMAL HIGH (ref 65–99)
Potassium: 4.3 mmol/L (ref 3.5–5.1)
SODIUM: 140 mmol/L (ref 135–145)

## 2015-07-27 LAB — CBC
HCT: 35.7 % — ABNORMAL LOW (ref 39.0–52.0)
Hemoglobin: 12.5 g/dL — ABNORMAL LOW (ref 13.0–17.0)
MCH: 32.8 pg (ref 26.0–34.0)
MCHC: 35 g/dL (ref 30.0–36.0)
MCV: 93.7 fL (ref 78.0–100.0)
PLATELETS: 151 10*3/uL (ref 150–400)
RBC: 3.81 MIL/uL — AB (ref 4.22–5.81)
RDW: 13.4 % (ref 11.5–15.5)
WBC: 3.8 10*3/uL — AB (ref 4.0–10.5)

## 2015-07-27 LAB — SURGICAL PCR SCREEN
MRSA, PCR: NEGATIVE
Staphylococcus aureus: NEGATIVE

## 2015-07-27 NOTE — Progress Notes (Signed)
Spoke with anesthesia/Dr Tamala Julian in regards to pts history of alpha 1 antitrypsin deficiency. No orders given. Anesthesia to see pt day of surgery.

## 2015-07-27 NOTE — Patient Instructions (Signed)
Ayomikun Kinkel Delone  [id number]   Your procedure is scheduled on: Friday Jul 30, 2015  Report to [facility] Main  Entrance take [place]  elevators to 3rd floor to  [place] at 12:15 PM.  Call this number if you have problems the morning of surgery [phone]   Remember: ONLY 1 PERSON MAY GO WITH YOU TO SHORT STAY TO GET  READY MORNING OF [place].  Do not eat food After Midnight but may take clear liquids till 8:00am day of surgery then nothing by mouth.      Take these medicines the morning of surgery with A SIP OF WATER:  Carvedilol (Coreg); Oxycodone-Acetaminophen if needed; Pantoprazole (Protonix) Rantinidine (Zantac); May use Ventolin Inhaler if needed (bring with you day of surgery) DO NOT TAKE ANY DIABETIC MEDICATIONS DAY OF YOUR SURGERY                               You may not have any metal on your body including hair pins and              piercings  Do not wear jewelry, lotions, powders or colognes, deodorant             Men may shave face and neck.   Do not bring valuables to the hospital. [place].  Contacts, dentures or bridgework may not be worn into surgery.  Leave suitcase in the car. After surgery it may be brought to your room.             _____________________________________________________________________             [facility] - Preparing for Surgery Before surgery, you can play an important role.  Because skin is not sterile, your skin needs to be as free of germs as possible.  You can reduce the number of germs on your skin by washing with CHG (chlorahexidine gluconate) soap before surgery.  CHG is an antiseptic cleaner which kills germs and bonds with the skin to continue killing germs even after washing. Please DO NOT use if you have an allergy to CHG or antibacterial soaps.  If your skin becomes reddened/irritated stop using the CHG and inform your nurse when you arrive at  Short Stay. Do not shave (including legs and underarms) for at least 48 hours prior to the first CHG shower.  You may shave your face/neck. Please follow these instructions carefully:  1.  Shower with CHG Soap the night before surgery and the  morning of Surgery.  2.  If you choose to wash your hair, wash your hair first as usual with your  normal  shampoo.  3.  After you shampoo, rinse your hair and body thoroughly to remove the  shampoo.                           4.  Use CHG as you would any other liquid soap.  You can apply chg directly  to the skin and wash                       Gently with a scrungie or clean washcloth.  5.  Apply the CHG  Soap to your body ONLY FROM THE NECK DOWN.   Do not use on face/ open                           Wound or open sores. Avoid contact with eyes, ears mouth and genitals (private parts).                       Wash face,  Genitals (private parts) with your normal soap.             6.  Wash thoroughly, paying special attention to the area where your surgery  will be performed.  7.  Thoroughly rinse your body with warm water from the neck down.  8.  DO NOT shower/wash with your normal soap after using and rinsing off  the CHG Soap.                9.  Pat yourself dry with a clean towel.            10.  Wear clean pajamas.            11.  Place clean sheets on your bed the night of your first shower and do not  sleep with pets. Day of Surgery : Do not apply any lotions/deodorants the morning of surgery.  Please wear clean clothes to the hospital/surgery center.  FAILURE TO FOLLOW THESE INSTRUCTIONS MAY RESULT IN THE CANCELLATION OF YOUR SURGERY PATIENT SIGNATURE_________________________________  NURSE SIGNATURE__________________________________  ________________________________________________________________________    CLEAR LIQUID DIET   Foods Allowed                                                                     Foods Excluded  Coffee and tea,  regular and decaf                             liquids that you cannot  Plain Jell-O in any flavor                                             see through such as: Fruit ices (not with fruit pulp)                                     milk, soups, orange juice  Iced Popsicles                                    All solid food Carbonated beverages, regular and diet                                    Cranberry, grape and apple juices Sports drinks like Gatorade Lightly seasoned clear broth or consume(fat free) Sugar, honey syrup  Sample Menu Breakfast  Lunch                                     Supper Cranberry juice                    Beef broth                            Chicken broth Jell-O                                     Grape juice                           Apple juice Coffee or tea                        Jell-O                                      Popsicle                                                Coffee or tea                        Coffee or tea  _____________________________________________________________________

## 2015-07-28 NOTE — Progress Notes (Signed)
Notified pt of surgical time change. Aware to arrive at [place] Stay at 12 noon on 07/30/2015.

## 2015-07-29 LAB — HEMOGLOBIN A1C
Hgb A1c MFr Bld: 6.2 % — ABNORMAL HIGH (ref 4.8–5.6)
Mean Plasma Glucose: 131 mg/dL

## 2015-07-30 ENCOUNTER — Observation Stay (HOSPITAL_COMMUNITY): Payer: Medicare Other

## 2015-07-30 ENCOUNTER — Observation Stay (HOSPITAL_COMMUNITY): Payer: Medicare Other | Admitting: Anesthesiology

## 2015-07-30 ENCOUNTER — Encounter (HOSPITAL_COMMUNITY)
Admission: RE | Disposition: A | Discharge status: Home / Self Care | Payer: Self-pay | Source: Ambulatory Visit | Attending: Orthopaedic Surgery

## 2015-07-30 ENCOUNTER — Observation Stay (HOSPITAL_COMMUNITY)
Admission: RE | Admit: 2015-07-30 | Discharge: 2015-07-31 | Disposition: A | Discharge status: Home / Self Care | Payer: Medicare Other | Source: Ambulatory Visit | Attending: Orthopaedic Surgery | Admitting: Orthopaedic Surgery

## 2015-07-30 ENCOUNTER — Encounter (HOSPITAL_COMMUNITY): Payer: Self-pay | Admitting: *Deleted

## 2015-07-30 DIAGNOSIS — I2510 Atherosclerotic heart disease of native coronary artery without angina pectoris: Secondary | ICD-10-CM | POA: Diagnosis not present

## 2015-07-30 DIAGNOSIS — K449 Diaphragmatic hernia without obstruction or gangrene: Secondary | ICD-10-CM | POA: Insufficient documentation

## 2015-07-30 DIAGNOSIS — M7062 Trochanteric bursitis, left hip: Secondary | ICD-10-CM | POA: Diagnosis not present

## 2015-07-30 DIAGNOSIS — I10 Essential (primary) hypertension: Secondary | ICD-10-CM | POA: Insufficient documentation

## 2015-07-30 DIAGNOSIS — Z9049 Acquired absence of other specified parts of digestive tract: Secondary | ICD-10-CM | POA: Insufficient documentation

## 2015-07-30 DIAGNOSIS — G4733 Obstructive sleep apnea (adult) (pediatric): Secondary | ICD-10-CM | POA: Insufficient documentation

## 2015-07-30 DIAGNOSIS — T8484XA Pain due to internal orthopedic prosthetic devices, implants and grafts, initial encounter: Principal | ICD-10-CM | POA: Insufficient documentation

## 2015-07-30 DIAGNOSIS — M7070 Other bursitis of hip, unspecified hip: Secondary | ICD-10-CM | POA: Diagnosis present

## 2015-07-30 DIAGNOSIS — M25552 Pain in left hip: Secondary | ICD-10-CM | POA: Insufficient documentation

## 2015-07-30 DIAGNOSIS — M1990 Unspecified osteoarthritis, unspecified site: Secondary | ICD-10-CM | POA: Insufficient documentation

## 2015-07-30 DIAGNOSIS — Y838 Other surgical procedures as the cause of abnormal reaction of the patient, or of later complication, without mention of misadventure at the time of the procedure: Secondary | ICD-10-CM | POA: Insufficient documentation

## 2015-07-30 DIAGNOSIS — E119 Type 2 diabetes mellitus without complications: Secondary | ICD-10-CM | POA: Insufficient documentation

## 2015-07-30 DIAGNOSIS — Z951 Presence of aortocoronary bypass graft: Secondary | ICD-10-CM | POA: Insufficient documentation

## 2015-07-30 DIAGNOSIS — K219 Gastro-esophageal reflux disease without esophagitis: Secondary | ICD-10-CM | POA: Insufficient documentation

## 2015-07-30 DIAGNOSIS — Z96653 Presence of artificial knee joint, bilateral: Secondary | ICD-10-CM | POA: Insufficient documentation

## 2015-07-30 DIAGNOSIS — Z9989 Dependence on other enabling machines and devices: Secondary | ICD-10-CM | POA: Insufficient documentation

## 2015-07-30 DIAGNOSIS — I480 Paroxysmal atrial fibrillation: Secondary | ICD-10-CM | POA: Insufficient documentation

## 2015-07-30 HISTORY — PX: EXCISION/RELEASE BURSA HIP: SHX5014

## 2015-07-30 HISTORY — PX: HARDWARE REMOVAL: SHX979

## 2015-07-30 LAB — TYPE AND SCREEN
ABO/RH(D): A POS
Antibody Screen: NEGATIVE

## 2015-07-30 LAB — GLUCOSE, CAPILLARY: Glucose-Capillary: 130 mg/dL — ABNORMAL HIGH (ref 65–99)

## 2015-07-30 SURGERY — REMOVAL, HARDWARE
Anesthesia: General | Site: Hip | Laterality: Left

## 2015-07-30 MED ORDER — EPHEDRINE SULFATE 50 MG/ML IJ SOLN
INTRAMUSCULAR | Status: DC | PRN
  Administered 2015-07-30: 10 mg via INTRAVENOUS

## 2015-07-30 MED ORDER — BUPIVACAINE HCL (PF) 0.5 % IJ SOLN
INTRAMUSCULAR | Status: AC
  Filled 2015-07-30: qty 30

## 2015-07-30 MED ORDER — ACETAMINOPHEN 325 MG PO TABS: 650.0000 mg | ORAL_TABLET | Freq: Four times a day (QID) | ORAL | Status: DC | PRN

## 2015-07-30 MED ORDER — ACETAMINOPHEN 650 MG RE SUPP
650.0000 mg | Freq: Four times a day (QID) | RECTAL | Status: DC | PRN
  Filled 2015-07-30: qty 1

## 2015-07-30 MED ORDER — HYDROMORPHONE HCL 1 MG/ML IJ SOLN
INTRAMUSCULAR | Status: AC
  Filled 2015-07-30: qty 1

## 2015-07-30 MED ORDER — CEFAZOLIN SODIUM-DEXTROSE 2-4 GM/100ML-% IV SOLN
INTRAVENOUS | Status: AC
  Filled 2015-07-30: qty 100

## 2015-07-30 MED ORDER — ALLOPURINOL 300 MG PO TABS
300.0000 mg | ORAL_TABLET | Freq: Every day | ORAL | Status: DC
  Administered 2015-07-30 – 2015-07-31 (×2): 300 mg via ORAL
  Filled 2015-07-30 (×2): qty 1

## 2015-07-30 MED ORDER — LINAGLIPTIN 5 MG PO TABS
5.0000 mg | ORAL_TABLET | Freq: Every day | ORAL | Status: DC
  Administered 2015-07-31: 5 mg via ORAL
  Filled 2015-07-30: qty 1

## 2015-07-30 MED ORDER — SUCCINYLCHOLINE CHLORIDE 20 MG/ML IJ SOLN
INTRAMUSCULAR | Status: DC | PRN
  Administered 2015-07-30: 100 mg via INTRAVENOUS

## 2015-07-30 MED ORDER — PHENYLEPHRINE 40 MCG/ML (10ML) SYRINGE FOR IV PUSH (FOR BLOOD PRESSURE SUPPORT)
PREFILLED_SYRINGE | INTRAVENOUS | Status: AC
  Filled 2015-07-30: qty 10

## 2015-07-30 MED ORDER — LIDOCAINE HCL (CARDIAC) 20 MG/ML IV SOLN
INTRAVENOUS | Status: DC | PRN
  Administered 2015-07-30: 60 mg via INTRAVENOUS

## 2015-07-30 MED ORDER — ONDANSETRON HCL 4 MG/2ML IJ SOLN
INTRAMUSCULAR | Status: AC
  Filled 2015-07-30: qty 2

## 2015-07-30 MED ORDER — CALCIUM CARBONATE ANTACID 500 MG PO CHEW
3.0000 | CHEWABLE_TABLET | Freq: Every day | ORAL | Status: DC
  Filled 2015-07-30: qty 4
  Filled 2015-07-30: qty 3

## 2015-07-30 MED ORDER — TADALAFIL 5 MG PO TABS: 5.0000 mg | ORAL_TABLET | Freq: Every day | ORAL | Status: DC

## 2015-07-30 MED ORDER — DIPHENHYDRAMINE HCL 12.5 MG/5ML PO ELIX: 12.5000 mg | ORAL_SOLUTION | ORAL | Status: DC | PRN

## 2015-07-30 MED ORDER — LIDOCAINE HCL (CARDIAC) 20 MG/ML IV SOLN
INTRAVENOUS | Status: AC
  Filled 2015-07-30: qty 5

## 2015-07-30 MED ORDER — ONDANSETRON HCL 4 MG PO TABS: 4.0000 mg | ORAL_TABLET | Freq: Four times a day (QID) | ORAL | Status: DC | PRN

## 2015-07-30 MED ORDER — METHOCARBAMOL 500 MG PO TABS
500.0000 mg | ORAL_TABLET | Freq: Four times a day (QID) | ORAL | Status: DC | PRN
  Filled 2015-07-30: qty 1

## 2015-07-30 MED ORDER — PHENYLEPHRINE HCL 10 MG/ML IJ SOLN
INTRAMUSCULAR | Status: DC | PRN
  Administered 2015-07-30 (×2): 40 ug via INTRAVENOUS
  Administered 2015-07-30 (×3): 80 ug via INTRAVENOUS
  Administered 2015-07-30 (×4): 40 ug via INTRAVENOUS
  Administered 2015-07-30: 80 ug via INTRAVENOUS
  Administered 2015-07-30: 40 ug via INTRAVENOUS

## 2015-07-30 MED ORDER — CARVEDILOL 6.25 MG PO TABS
6.2500 mg | ORAL_TABLET | Freq: Two times a day (BID) | ORAL | Status: DC
  Administered 2015-07-30 – 2015-07-31 (×2): 6.25 mg via ORAL
  Filled 2015-07-30 (×2): qty 1

## 2015-07-30 MED ORDER — HYDROMORPHONE HCL 1 MG/ML IJ SOLN
1.0000 mg | INTRAMUSCULAR | Status: DC | PRN
  Administered 2015-07-30 – 2015-07-31 (×2): 1 mg via INTRAVENOUS
  Filled 2015-07-30 (×2): qty 1

## 2015-07-30 MED ORDER — ADULT MULTIVITAMIN W/MINERALS CH
1.0000 | ORAL_TABLET | Freq: Every day | ORAL | Status: DC
  Administered 2015-07-30: 1 via ORAL
  Filled 2015-07-30 (×2): qty 1

## 2015-07-30 MED ORDER — HYDROMORPHONE HCL 1 MG/ML IJ SOLN
0.5000 mg | INTRAMUSCULAR | Status: DC | PRN
  Administered 2015-07-30 (×2): 0.5 mg via INTRAVENOUS

## 2015-07-30 MED ORDER — METFORMIN HCL ER 500 MG PO TB24
1000.0000 mg | ORAL_TABLET | Freq: Every day | ORAL | Status: DC
  Administered 2015-07-31: 1000 mg via ORAL
  Filled 2015-07-30: qty 2

## 2015-07-30 MED ORDER — ASPIRIN EC 81 MG PO TBEC
81.0000 mg | DELAYED_RELEASE_TABLET | Freq: Every day | ORAL | Status: DC
  Administered 2015-07-30 – 2015-07-31 (×2): 81 mg via ORAL
  Filled 2015-07-30 (×2): qty 1

## 2015-07-30 MED ORDER — CEFAZOLIN SODIUM 1-5 GM-% IV SOLN
1.0000 g | Freq: Four times a day (QID) | INTRAVENOUS | Status: AC
  Administered 2015-07-30 – 2015-07-31 (×3): 1 g via INTRAVENOUS
  Filled 2015-07-30 (×4): qty 50

## 2015-07-30 MED ORDER — LACTATED RINGERS IV SOLN
INTRAVENOUS | Status: DC
  Administered 2015-07-30 (×2): via INTRAVENOUS

## 2015-07-30 MED ORDER — MEPERIDINE HCL 50 MG/ML IJ SOLN: 6.2500 mg | INTRAMUSCULAR | Status: DC | PRN

## 2015-07-30 MED ORDER — EPHEDRINE SULFATE 50 MG/ML IJ SOLN
INTRAMUSCULAR | Status: AC
  Filled 2015-07-30: qty 1

## 2015-07-30 MED ORDER — SODIUM CHLORIDE 0.9 % IV SOLN
INTRAVENOUS | Status: DC
  Administered 2015-07-30: 19:00:00 via INTRAVENOUS

## 2015-07-30 MED ORDER — 0.9 % SODIUM CHLORIDE (POUR BTL) OPTIME
TOPICAL | Status: DC | PRN
  Administered 2015-07-30: 1000 mL

## 2015-07-30 MED ORDER — FENTANYL CITRATE (PF) 100 MCG/2ML IJ SOLN
INTRAMUSCULAR | Status: AC
  Filled 2015-07-30: qty 2

## 2015-07-30 MED ORDER — OXYCODONE HCL 5 MG PO TABS
5.0000 mg | ORAL_TABLET | ORAL | Status: DC | PRN
  Administered 2015-07-30: 10 mg via ORAL
  Filled 2015-07-30: qty 2

## 2015-07-30 MED ORDER — ZOLPIDEM TARTRATE 5 MG PO TABS: 5.0000 mg | ORAL_TABLET | Freq: Every evening | ORAL | Status: DC | PRN

## 2015-07-30 MED ORDER — ATORVASTATIN CALCIUM 20 MG PO TABS
40.0000 mg | ORAL_TABLET | Freq: Every day | ORAL | Status: DC
  Administered 2015-07-30 – 2015-07-31 (×2): 40 mg via ORAL
  Filled 2015-07-30: qty 2
  Filled 2015-07-30: qty 4

## 2015-07-30 MED ORDER — FENTANYL CITRATE (PF) 250 MCG/5ML IJ SOLN
INTRAMUSCULAR | Status: AC
  Filled 2015-07-30: qty 5

## 2015-07-30 MED ORDER — FAMOTIDINE 20 MG PO TABS
20.0000 mg | ORAL_TABLET | Freq: Two times a day (BID) | ORAL | Status: DC
  Administered 2015-07-30 – 2015-07-31 (×2): 20 mg via ORAL
  Filled 2015-07-30 (×2): qty 1

## 2015-07-30 MED ORDER — PROPOFOL 10 MG/ML IV BOLUS
INTRAVENOUS | Status: DC | PRN
  Administered 2015-07-30: 100 mg via INTRAVENOUS
  Administered 2015-07-30: 50 mg via INTRAVENOUS

## 2015-07-30 MED ORDER — OXYCODONE HCL ER 10 MG PO T12A: 10.0000 mg | EXTENDED_RELEASE_TABLET | Freq: Two times a day (BID) | ORAL | Status: DC

## 2015-07-30 MED ORDER — ONDANSETRON HCL 4 MG/2ML IJ SOLN: 4.0000 mg | Freq: Four times a day (QID) | INTRAMUSCULAR | Status: DC | PRN

## 2015-07-30 MED ORDER — METHOCARBAMOL 1000 MG/10ML IJ SOLN
500.0000 mg | Freq: Four times a day (QID) | INTRAVENOUS | Status: DC | PRN
  Administered 2015-07-30: 500 mg via INTRAVENOUS
  Filled 2015-07-30: qty 550
  Filled 2015-07-30: qty 5

## 2015-07-30 MED ORDER — METOCLOPRAMIDE HCL 5 MG PO TABS: 5.0000 mg | ORAL_TABLET | Freq: Three times a day (TID) | ORAL | Status: DC | PRN

## 2015-07-30 MED ORDER — ALBUTEROL SULFATE (2.5 MG/3ML) 0.083% IN NEBU: 3.0000 mL | INHALATION_SOLUTION | Freq: Four times a day (QID) | RESPIRATORY_TRACT | Status: DC | PRN

## 2015-07-30 MED ORDER — BUPIVACAINE HCL (PF) 0.5 % IJ SOLN
INTRAMUSCULAR | Status: DC | PRN
  Administered 2015-07-30: 20 mL

## 2015-07-30 MED ORDER — OXYCODONE HCL ER 10 MG PO T12A
10.0000 mg | EXTENDED_RELEASE_TABLET | Freq: Two times a day (BID) | ORAL | Status: DC
  Administered 2015-07-31 (×2): 10 mg via ORAL
  Filled 2015-07-30 (×2): qty 1

## 2015-07-30 MED ORDER — METOCLOPRAMIDE HCL 5 MG/ML IJ SOLN: 5.0000 mg | Freq: Three times a day (TID) | INTRAMUSCULAR | Status: DC | PRN

## 2015-07-30 MED ORDER — FENTANYL CITRATE (PF) 100 MCG/2ML IJ SOLN
25.0000 ug | INTRAMUSCULAR | Status: DC | PRN
  Administered 2015-07-30 (×2): 50 ug via INTRAVENOUS

## 2015-07-30 MED ORDER — NAPROXEN 500 MG PO TABS
500.0000 mg | ORAL_TABLET | Freq: Three times a day (TID) | ORAL | Status: DC | PRN
  Filled 2015-07-30: qty 1

## 2015-07-30 MED ORDER — SAXAGLIPTIN-METFORMIN ER 2.5-1000 MG PO TB24: 1.0000 | ORAL_TABLET | Freq: Every day | ORAL | Status: DC

## 2015-07-30 MED ORDER — DOCUSATE SODIUM 100 MG PO CAPS
100.0000 mg | ORAL_CAPSULE | Freq: Two times a day (BID) | ORAL | Status: DC
  Administered 2015-07-30 – 2015-07-31 (×2): 100 mg via ORAL
  Filled 2015-07-30 (×2): qty 1

## 2015-07-30 MED ORDER — LACTATED RINGERS IV SOLN: INTRAVENOUS | Status: DC

## 2015-07-30 MED ORDER — CEFAZOLIN SODIUM-DEXTROSE 2-4 GM/100ML-% IV SOLN
2.0000 g | Freq: Once | INTRAVENOUS | Status: AC
  Administered 2015-07-30: 2 g via INTRAVENOUS
  Filled 2015-07-30: qty 100

## 2015-07-30 MED ORDER — ACETAMINOPHEN 650 MG RE SUPP: 650.0000 mg | Freq: Four times a day (QID) | RECTAL | Status: DC | PRN

## 2015-07-30 MED ORDER — PANTOPRAZOLE SODIUM 40 MG PO TBEC
40.0000 mg | DELAYED_RELEASE_TABLET | Freq: Every day | ORAL | Status: DC
  Administered 2015-07-31: 40 mg via ORAL
  Filled 2015-07-30: qty 1

## 2015-07-30 MED ORDER — KETOROLAC TROMETHAMINE 15 MG/ML IJ SOLN
7.5000 mg | Freq: Four times a day (QID) | INTRAMUSCULAR | Status: DC
  Administered 2015-07-30 – 2015-07-31 (×3): 7.5 mg via INTRAVENOUS
  Filled 2015-07-30 (×3): qty 1

## 2015-07-30 MED ORDER — FENTANYL CITRATE (PF) 100 MCG/2ML IJ SOLN
INTRAMUSCULAR | Status: DC | PRN
  Administered 2015-07-30 (×3): 50 ug via INTRAVENOUS
  Administered 2015-07-30: 25 ug via INTRAVENOUS
  Administered 2015-07-30 (×4): 50 ug via INTRAVENOUS

## 2015-07-30 MED ORDER — PROPOFOL 10 MG/ML IV BOLUS
INTRAVENOUS | Status: AC
  Filled 2015-07-30: qty 20

## 2015-07-30 SURGICAL SUPPLY — 36 items
ANCHOR SUPER QUICK (Anchor) ×3 IMPLANT
BAG ZIPLOCK 12X15 (MISCELLANEOUS) ×3 IMPLANT
BLADE EXTENDED COATED 6.5IN (ELECTRODE) IMPLANT
CLOSURE WOUND 1/2 X4 (GAUZE/BANDAGES/DRESSINGS)
DRAPE INCISE IOBAN 66X45 STRL (DRAPES) IMPLANT
DRAPE POUCH INSTRU U-SHP 10X18 (DRAPES) ×3 IMPLANT
DRAPE U-SHAPE 47X51 STRL (DRAPES) IMPLANT
DRSG ADAPTIC 3X8 NADH LF (GAUZE/BANDAGES/DRESSINGS) ×3 IMPLANT
DRSG AQUACEL AG ADV 3.5X 4 (GAUZE/BANDAGES/DRESSINGS) ×3 IMPLANT
DRSG MEPILEX BORDER 4X12 (GAUZE/BANDAGES/DRESSINGS) ×3 IMPLANT
DRSG MEPILEX BORDER 4X8 (GAUZE/BANDAGES/DRESSINGS) ×6 IMPLANT
ELECT REM PT RETURN 9FT ADLT (ELECTROSURGICAL) ×3
ELECTRODE REM PT RTRN 9FT ADLT (ELECTROSURGICAL) ×1 IMPLANT
GAUZE XEROFORM 1X8 LF (GAUZE/BANDAGES/DRESSINGS) ×3 IMPLANT
GLOVE BIO SURGEON STRL SZ7.5 (GLOVE) ×3 IMPLANT
GLOVE BIO SURGEON STRL SZ8 (GLOVE) ×6 IMPLANT
GLOVE BIOGEL PI IND STRL 8 (GLOVE) ×2 IMPLANT
GLOVE BIOGEL PI INDICATOR 8 (GLOVE) ×4
GLOVE ECLIPSE 8.0 STRL XLNG CF (GLOVE) ×3 IMPLANT
GOWN STRL REUS W/TWL XL LVL3 (GOWN DISPOSABLE) ×9 IMPLANT
GUIDE PIN 3.2MM (MISCELLANEOUS) ×2
GUIDE PIN ORTH 343X3.2XBRAD (MISCELLANEOUS) ×1 IMPLANT
MANIFOLD NEPTUNE II (INSTRUMENTS) ×3 IMPLANT
NS IRRIG 1000ML POUR BTL (IV SOLUTION) ×3 IMPLANT
PACK ANTERIOR HIP CUSTOM (KITS) ×3 IMPLANT
POSITIONER SURGICAL ARM (MISCELLANEOUS) ×3 IMPLANT
STAPLER VISISTAT 35W (STAPLE) ×3 IMPLANT
STRIP CLOSURE SKIN 1/2X4 (GAUZE/BANDAGES/DRESSINGS) IMPLANT
SUT ETHIBOND NAB CT1 #1 30IN (SUTURE) ×6 IMPLANT
SUT VIC AB 1 CT1 27 (SUTURE) ×6
SUT VIC AB 1 CT1 27XBRD ANTBC (SUTURE) ×3 IMPLANT
SUT VIC AB 2-0 CT1 27 (SUTURE) ×6
SUT VIC AB 2-0 CT1 TAPERPNT 27 (SUTURE) ×3 IMPLANT
SYR 30ML LL (SYRINGE) ×3 IMPLANT
TOWEL OR 17X26 10 PK STRL BLUE (TOWEL DISPOSABLE) ×6 IMPLANT
TRAY CATH 16FR W/PLASTIC CATH (SET/KITS/TRAYS/PACK) ×3 IMPLANT

## 2015-07-30 NOTE — H&P (Signed)
Lucas Morales is an 82 y.o. male.   Chief Complaint:   Left hip pain HPI:   82 yo male who over a year ago underwent open reduction/internal fixation of a left hip intertrochanteric fracture.  He has since healed hi fracture, but continues to have chronic trochanteric bursitis of the left hip as a result of his hardware.  He has failed all forms of conservative treatment including ice, heat, NSAIDs, time, activity modification and multiple steroid injections.  At this point we are recommending hardware removal and a trochanteric bursectomy of his left hip.  Risks and benefits have been discussed in detail.  Past Medical History  Diagnosis Date  . Adenomatous colon polyp   . Hemorrhoids   . Diverticulosis   . Gastritis   . IC (interstitial cystitis)   . Esophageal stricture   . Diverticulitis   . Hyperlipidemia     doesn't require meds  . Coronary artery disease   . Joint pain   . Joint swelling   . GERD (gastroesophageal reflux disease)     takes Pantoprazole daily  . History of colon polyps   . Urinary frequency   . Nocturia   . Insomnia     takes Nortrypyline nightly  . Hiatal hernia   . Esophageal dysmotility   . Paroxysmal atrial fibrillation (HCC)   . Dysrhythmia     bradycardia in the 30's (01/11/2012).. Afib briefly after CABG  . Gout   . Dyspnea on exertion   . OSA (obstructive sleep apnea)     "mild; tried CPAP; couldn't tolerate" (09/01/2014)  . Type II diabetes mellitus ([place])     "borderline"  . Chronic bronchitis ([place])     "get it pretty much q yr" (09/01/2014)  . Bruises easily   . Arthritis     "knees; bad in my back" (09/01/2014)  . Chronic lower back pain   . Sciatic nerve pain   . Depression     "related to health" (09/01/2014)  . Hypertension     takes Losartan daily; current not taking   . Alpha-1-antitrypsin deficiency ([place])   . Anxiety     Past Surgical History  Procedure Laterality Date  . Reconstruction medial collateral ligament elbow w/  tendon graft Right 1988 X 2  . Total knee arthroplasty Bilateral ~ 2001; 2011    right; left  . Bladder lesion removed    . Exploratory laparotomy  2009    with right colectomy  . Tonsillectomy and adenoidectomy  ~1943  . Appendectomy  2009  . Cholecystectomy  2009  . Lumbar disc surgery  ?2010    "cleaned out the stenosis" (01/11/2012)  . Cystoscopy with ureteroscopy  2008; 2009    "painted inside of bladder wall"  . Cataract extraction w/ intraocular lens  implant, bilateral Bilateral 2011  . Tumor excision Left 12/2011    arm  . Elbow surgery Right 1988    "reconstructive OR"  . Partial colectomy      d/t diverticulosis  . Heel spur excision Bilateral 1970's  . Colonoscopy    . Esophagogastroduodenoscopy    . Coronary artery bypass graft  01/18/2012    Procedure: CORONARY ARTERY BYPASS GRAFTING (CABG);  Surgeon: Ivin Poot, MD;  Location: [place];  Service: Open Heart Surgery;  Laterality: N/A;  times four, using left internal mammary artery  . Endovein harvest of greater saphenous vein  01/18/2012    Procedure: ENDOVEIN HARVEST OF GREATER SAPHENOUS VEIN;  Surgeon: Tharon Aquas  Kerby Less, MD;  Location: [place];  Service: Open Heart Surgery;  Laterality: Right;  . Cardiac catheterization  01/11/2012  . Joint replacement    . 25 gauge pars plana vitrectomy with 20 gauge mvr port for macular hole Right 10/08/2012    Procedure: 25 GAUGE PARS PLANA VITRECTOMY WITH 20 GAUGE MVR PORT FOR MACULAR HOLE; Membrame peel, serum patch; Laser treatment ; Gas exchange;  Surgeon: Hayden Pedro, MD;  Location: [place];  Service: Ophthalmology;  Laterality: Right;  . Left heart catheterization with coronary angiogram N/A 01/11/2012    Procedure: LEFT HEART CATHETERIZATION WITH CORONARY ANGIOGRAM;  Surgeon: Leonie Man, MD;  Location: [facility] CATH LAB;  Service: Cardiovascular;  Laterality: N/A;  . Compression hip screw Left 04/04/2014    Procedure: COMPRESSION HIP LEFT;  Surgeon: Mcarthur Rossetti,  MD;  Location: [place];  Service: Orthopedics;  Laterality: Left;  . Esophagogastroduodenoscopy N/A 06/05/2014    Procedure: ESOPHAGOGASTRODUODENOSCOPY (EGD);  Surgeon: Irene Shipper, MD;  Location: [facility];  Service: Endoscopy;  Laterality: N/A;  . Pars plana vitrectomy w/ repair of macular hole Left 09/01/2014  . Fracture surgery    . Intertrochanteric hip fracture surgery Left 04/04/2014  . Coronary angioplasty    . 25 gauge pars plana vitrectomy with 20 gauge mvr port for macular hole Left 09/01/2014    Procedure: 27 GAUGE PARS PLANA VITRECTOMY WITH 20 GAUGE MVR PORT FOR MACULAR HOLE;  Surgeon: Hayden Pedro, MD;  Location: [place];  Service: Ophthalmology;  Laterality: Left;  . Photocoagulation with laser Left 09/01/2014    Procedure: PHOTOCOAGULATION WITH LASER;  Surgeon: Hayden Pedro, MD;  Location: [place];  Service: Ophthalmology;  Laterality: Left;  HEADSCOPE LASER  . Serum patch Left 09/01/2014    Procedure: SERUM PATCH;  Surgeon: Hayden Pedro, MD;  Location: [place];  Service: Ophthalmology;  Laterality: Left;  [name] Membrane peel Left 09/01/2014    Procedure: MEMBRANE PEEL;  Surgeon: Hayden Pedro, MD;  Location: [place];  Service: Ophthalmology;  Laterality: Left;  . Gas/fluid exchange Left 09/01/2014    Procedure: GAS/FLUID EXCHANGE;  Surgeon: Hayden Pedro, MD;  Location: [place];  Service: Ophthalmology;  Laterality: Left;  . Gas insertion Left 09/01/2014    Procedure: INSERTION OF GAS;  Surgeon: Hayden Pedro, MD;  Location: [place];  Service: Ophthalmology;  Laterality: Left;  C3F8  . Transthoracic echocardiogram  01/04/2012    MODERATE SEVERE-SEVERE LVH. EF=>55%. MILDLY IMPAIRED LV RELAXATION. LA IS MODERATE TO SEVERE DILATED. MODERATE MR. MV LEAFLETS APPEAR THICHENED.  [name] Nm myocar multiple w/spect  04/09/2012    NORMAL STRESS NUCLEAR STUDY. EF 51%.    Family History  Problem Relation Age of Onset  . Colon cancer Neg Hx   . Kidney disease Father   . Emphysema Mother   .  Emphysema Maternal Grandfather    Social History:  reports that he has never smoked. He has never used smokeless tobacco. He reports that he does not drink alcohol or use illicit drugs.  Allergies:  Allergies  Allergen Reactions  . Morphine Itching    No prescriptions prior to admission    No results found for this or any previous visit (from the past 48 hour(s)). No results found.  Review of Systems  All other systems reviewed and are negative.   There were no vitals taken for this visit. Physical Exam  Constitutional: He is oriented to person, place, and time. He appears well-developed and well-nourished.  HENT:  Head: Normocephalic and atraumatic.  Eyes: EOM are normal. Pupils are equal, round, and reactive to light.  Neck: Normal range of motion. Neck supple.  Cardiovascular: Normal rate and regular rhythm.   Respiratory: Effort normal and breath sounds normal.  GI: Soft. Bowel sounds are normal.  Musculoskeletal:       Left hip: He exhibits tenderness and bony tenderness.  Neurological: He is alert and oriented to person, place, and time.  Skin: Skin is warm and dry.  Psychiatric: He has a normal mood and affect.     Assessment/Plan Left hip with painful, prominent hardware and chronic trochanteric bursitis 1)  To the OR today for removal of hardware from his left hip and a trochanteric bursectomy.  Mcarthur Rossetti, MD 07/30/2015, 7:20 AM

## 2015-07-30 NOTE — Anesthesia Postprocedure Evaluation (Deleted)
Anesthesia Post Note  Patient: Codi L Poteat  Procedure(s) Performed: Procedure(s) (LRB): attempted LEFT HIP HARDWARE REMOVAL, LEFT HIP BURSECTOMY (Left) EXCISION/RELEASE BURSA HIP (Left)  Patient location during evaluation: PACU Anesthesia Type: General Level of consciousness: awake and alert Pain management: pain level controlled Vital Signs Assessment: post-procedure vital signs reviewed and stable Respiratory status: spontaneous breathing, nonlabored ventilation, respiratory function stable and patient connected to nasal cannula oxygen Cardiovascular status: blood pressure returned to baseline and stable Postop Assessment: no signs of nausea or vomiting Anesthetic complications: no    Last Vitals:  Filed Vitals:   07/30/15 1205  BP: 139/82  Pulse: 78  Temp: 36.7 C  Resp: 18    Last Pain: There were no vitals filed for this visit.               Effie Berkshire

## 2015-07-30 NOTE — Brief Op Note (Signed)
07/30/2015  4:56 PM  PATIENT:  Lucas Morales  82 y.o. male  PRE-OPERATIVE DIAGNOSIS:  Painful hardware left hip, left hip bursitis  POST-OPERATIVE DIAGNOSIS:  Painful hardware left hip, left hip bursitis  PROCEDURE:  Procedure(s): attempted LEFT HIP HARDWARE REMOVAL, LEFT HIP BURSECTOMY (Left) EXCISION/RELEASE BURSA HIP (Left)  SURGEON:  Surgeon(s) and Role:    * Mcarthur Rossetti, MD - Primary  PHYSICIAN ASSISTANT: Benita Stabile, PA-C  ANESTHESIA:   local and general  EBL:  Total I/O In: 1000 [I.V.:1000] Out: 350 [Blood:350]  COUNTS:  YES  TOURNIQUET:  * No tourniquets in log *  DICTATION: .Other Dictation: Dictation Number [id number]  PLAN OF CARE: Admit to inpatient   PATIENT DISPOSITION:  PACU - hemodynamically stable.   Delay start of Pharmacological VTE agent (>24hrs) due to surgical blood loss or risk of bleeding: no

## 2015-07-30 NOTE — Addendum Note (Signed)
Addendum  created 07/30/15 1851 by Effie Berkshire, MD   Modules edited: Clinical Notes   Clinical Notes:  File: [id number]

## 2015-07-30 NOTE — Anesthesia Preprocedure Evaluation (Addendum)
Anesthesia Evaluation  Patient identified by MRN, date of birth, ID band Patient awake    Reviewed: Allergy & Precautions, NPO status , Patient's Chart, lab work & pertinent test results, reviewed documented beta blocker date and time   Airway Mallampati: III  TM Distance: >3 FB Neck ROM: Full    Dental  (+) Teeth Intact, Dental Advisory Given   Pulmonary sleep apnea ,    breath sounds clear to auscultation       Cardiovascular hypertension, Pt. on medications and Pt. on home beta blockers + CAD  + dysrhythmias  Rhythm:Regular Rate:Normal     Neuro/Psych PSYCHIATRIC DISORDERS Anxiety Depression  Neuromuscular disease    GI/Hepatic Neg liver ROS, hiatal hernia, PUD, GERD  Medicated and Poorly Controlled,  Endo/Other  diabetes, Type 2  Renal/GU negative Renal ROS  negative genitourinary   Musculoskeletal  (+) Arthritis , Osteoarthritis,    Abdominal   Peds negative pediatric ROS (+)  Hematology negative hematology ROS (+)   Anesthesia Other Findings   Reproductive/Obstetrics negative OB ROS                          Anesthesia Physical Anesthesia Plan  ASA: III  Anesthesia Plan: General   Post-op Pain Management:    Induction: Intravenous  Airway Management Planned: Oral ETT and Video Laryngoscope Planned  Additional Equipment:   Intra-op Plan:   Post-operative Plan: Extubation in OR  Informed Consent: I have reviewed the patients History and Physical, chart, labs and discussed the procedure including the risks, benefits and alternatives for the proposed anesthesia with the patient or authorized representative who has indicated his/her understanding and acceptance.   Dental advisory given  Plan Discussed with: CRNA  Anesthesia Plan Comments:       Anesthesia Quick Evaluation

## 2015-07-30 NOTE — Anesthesia Postprocedure Evaluation (Deleted)
Anesthesia Post Note  Patient: Lucas Morales  Procedure(s) Performed: Procedure(s) (LRB): attempted LEFT HIP HARDWARE REMOVAL, LEFT HIP BURSECTOMY (Left) EXCISION/RELEASE BURSA HIP (Left)  Anesthesia Post Evaluation  Last Vitals:  Filed Vitals:   07/30/15 1724 07/30/15 1745  BP: 120/79 125/76  Pulse: 70 72  Temp:    Resp: 12 15    Last Pain:  Filed Vitals:   07/30/15 1752  PainSc: Lucas Morales

## 2015-07-30 NOTE — Transfer of Care (Signed)
Immediate Anesthesia Transfer of Care Note  Patient: Lucas Morales  Procedure(s) Performed: Procedure(s): attempted LEFT HIP HARDWARE REMOVAL, LEFT HIP BURSECTOMY (Left) EXCISION/RELEASE BURSA HIP (Left)  Patient Location: PACU  Anesthesia Type:General  Level of Consciousness:  sedated, patient cooperative and responds to stimulation  Airway & Oxygen Therapy:Patient Spontanous Breathing and Patient connected to face mask oxgen  Post-op Assessment:  Report given to PACU RN and Post -op Vital signs reviewed and stable  Post vital signs:  Reviewed and stable  Last Vitals:  Filed Vitals:   07/30/15 1205  BP: 139/82  Pulse: 78  Temp: 36.7 C  Resp: 18    Complications: No apparent anesthesia complications

## 2015-07-30 NOTE — Anesthesia Procedure Notes (Signed)
Procedure Name: Intubation Date/Time: 07/30/2015 2:12 PM Performed by: Gaston Islam EVETTE Pre-anesthesia Checklist: Patient identified, Suction available and Patient being monitored Patient Re-evaluated:Patient Re-evaluated prior to inductionOxygen Delivery Method: Circle system utilized Preoxygenation: Pre-oxygenation with 100% oxygen Intubation Type: IV induction Ventilation: Mask ventilation without difficulty Laryngoscope Size: Glidescope and 4 Grade View: Grade I Tube size: 7.5 mm Number of attempts: 1 Airway Equipment and Method: Patient positioned with wedge pillow,  Stylet and Video-laryngoscopy Placement Confirmation: ETT inserted through vocal cords under direct vision,  positive ETCO2,  CO2 detector and breath sounds checked- equal and bilateral Secured at: 22 cm Tube secured with: Tape Dental Injury: Teeth and Oropharynx as per pre-operative assessment

## 2015-07-30 NOTE — Progress Notes (Signed)
Patient complaining that current pain med not helping with pain. On call MD paged and new order received

## 2015-07-30 NOTE — Anesthesia Postprocedure Evaluation (Signed)
Anesthesia Post Note  Patient: Dustyn L Akard  Procedure(s) Performed: Procedure(s) (LRB): attempted LEFT HIP HARDWARE REMOVAL, LEFT HIP BURSECTOMY (Left) EXCISION/RELEASE BURSA HIP (Left)  Patient location during evaluation: PACU Anesthesia Type: General Level of consciousness: awake and alert Pain management: pain level controlled Vital Signs Assessment: post-procedure vital signs reviewed and stable Respiratory status: spontaneous breathing, nonlabored ventilation, respiratory function stable and patient connected to nasal cannula oxygen Cardiovascular status: blood pressure returned to baseline and stable Postop Assessment: no signs of nausea or vomiting Anesthetic complications: no    Last Vitals:  Filed Vitals:   07/30/15 1825 07/30/15 1840  BP: 118/70 128/75  Pulse:  66  Temp: 36.6 C 36.8 C  Resp: 12 12    Last Pain:  Filed Vitals:   07/30/15 1840  PainSc: [place] Zani Kyllonen

## 2015-07-30 NOTE — Addendum Note (Signed)
Addendum  created 07/30/15 1837 by Effie Berkshire, MD   Modules edited: Clinical Notes, Notes Section   Clinical Notes:  File: [id number]   Notes Section:  Delete: [id number]; Delete: [id number]

## 2015-07-31 DIAGNOSIS — T8484XA Pain due to internal orthopedic prosthetic devices, implants and grafts, initial encounter: Secondary | ICD-10-CM | POA: Diagnosis not present

## 2015-07-31 LAB — GLUCOSE, CAPILLARY: Glucose-Capillary: 117 mg/dL — ABNORMAL HIGH (ref 65–99)

## 2015-07-31 MED ORDER — OXYCODONE-ACETAMINOPHEN 5-325 MG PO TABS: 1.0000 | ORAL_TABLET | ORAL | Status: DC | PRN

## 2015-07-31 MED ORDER — OXYCODONE HCL ER 10 MG PO T12A: 10.0000 mg | EXTENDED_RELEASE_TABLET | Freq: Two times a day (BID) | ORAL | Status: DC

## 2015-07-31 MED ORDER — ADULT MULTIVITAMIN W/MINERALS CH
1.0000 | ORAL_TABLET | Freq: Every day | ORAL | Status: DC
  Administered 2015-07-31: 1 via ORAL
  Filled 2015-07-31: qty 1

## 2015-07-31 MED ORDER — METHOCARBAMOL 500 MG PO TABS: 500.0000 mg | ORAL_TABLET | Freq: Four times a day (QID) | ORAL | Status: DC | PRN

## 2015-07-31 NOTE — Progress Notes (Signed)
Discharged from floor via w/c for transport to home by car. Belongings & wife with pt. No changes in assessment. [name], [employer]

## 2015-07-31 NOTE — Progress Notes (Signed)
Subjective: 1 Day Post-Op Procedure(s) (LRB): attempted LEFT HIP HARDWARE REMOVAL, LEFT HIP BURSECTOMY (Left) EXCISION/RELEASE BURSA HIP (Left) Patient reports pain as moderate.    Objective: Vital signs in last 24 hours: Temp:  [97.5 F (36.4 C)-98.3 F (36.8 C)] 97.5 F (36.4 C) (05/13 0525) Pulse Rate:  [64-78] 65 (05/13 0525) Resp:  [12-18] 16 (05/13 0525) BP: (109-139)/(62-82) 109/62 mmHg (05/13 0525) SpO2:  [93 %-100 %] 97 % (05/13 0525) FiO2 (%):  [2 %] 2 % (05/12 2248) Weight:  [110.678 kg (244 lb)] 110.678 kg (244 lb) (05/12 1239)  Intake/Output from previous day: 05/12 0701 - 05/13 0700 In: 1800 [I.V.:1800] Out: 1050 [Urine:700; Blood:350] Intake/Output this shift:    No results for input(s): HGB in the last 72 hours. No results for input(s): WBC, RBC, HCT, PLT in the last 72 hours. No results for input(s): NA, K, CL, CO2, BUN, CREATININE, GLUCOSE, CALCIUM in the last 72 hours. No results for input(s): LABPT, INR in the last 72 hours.  Sensation intact distally Intact pulses distally Dorsiflexion/Plantar flexion intact Incision: scant drainage No cellulitis present Compartment soft  Assessment/Plan: 1 Day Post-Op Procedure(s) (LRB): attempted LEFT HIP HARDWARE REMOVAL, LEFT HIP BURSECTOMY (Left) EXCISION/RELEASE BURSA HIP (Left) New dressing applied to left hip incisions. Can increase activities as tolerated. Discharge to home today.  Mcarthur Rossetti 07/31/2015, 9:37 AM

## 2015-07-31 NOTE — Discharge Instructions (Signed)
Increase your activities as comfort allows. Do not sleep on your left hip. You can ice your hip as needed for swelling. Your current hip dressing can get wet in the shower and stay on for 2 weeks, but you can change it as needed. No water aerobics for 4 weeks.

## 2015-07-31 NOTE — Op Note (Signed)
NAME:  Lucas Morales, Lucas Morales NO.:  [id number]  MEDICAL RECORD NO.:  [id number]  LOCATION:  19                         FACILITY:  [facility]  PHYSICIAN:  Lind Guest. Ninfa Linden, M.D.DATE OF BIRTH:  1933/11/17  DATE OF PROCEDURE:  07/30/2015 DATE OF DISCHARGE:                              OPERATIVE REPORT   PREOPERATIVE DIAGNOSIS:  Painful retained hardware, left hip with chronic trochanteric bursitis.  POSTOPERATIVE DIAGNOSIS:  Painful retained hardware, left hip with chronic trochanteric bursitis.  PROCEDURES: 1. Attempted, but failed removal of hardware from left hip. 2. Trochanteric bursectomy, left hip.  SURGEON:  Lind Guest. Ninfa Linden, M.D.  ASSISTANT:  Erskine Emery, PA-C.  ANESTHESIA: 1. General. 2. Local with 0.5% plain Marcaine.  BLOOD LOSS:  350 mL.  COMPLICATIONS:  None other than failure to remove hardware.  INDICATIONS:  Mr. Norum is a 82 year old gentleman who in January of 2016, had a mechanical fall and sustained a significant displaced, intertrochanteric hip fracture of the left hip.  He had an intramedullary rod and hip screw construct placed and since it healed the fracture.  He developed trochanteric bursitis over his left hip and we treated this with anti-inflammatories, ice, rest, heat, physical therapy, and even multiple injections.  It was gotten to the point where it was frustrating for him that it was sterile hurting, so we recommended at least the trochanteric bursectomy and likely removal of hardware, although we did have a discussion that the hardware remove too difficult to come out and cause too much damage to come out and he said he would understand that completely if the hardware was staying in, we just performed a bursectomy.  PROCEDURE DESCRIPTION:  After informed consent was obtained, appropriate left hip was marked.  He was brought to the operating room and placed supine on the operating table, general anesthesia  was then obtained. His left operative hip was prepped and draped with DuraPrep and sterile drapes.  Time-out was called, he was identified as correct patient and correct left hip.  We then made two incisions of the proximal femur, one to the tip of the greater trochanter and second one further down where the previous incisions were made to place the hip screw.  I dissected down to the tip of the greater trochanteric area.  We could not feel an exposed hardware, so we had to remove some bone, but we did perform a bursectomy in this area.  We then were able to remove the set screw that releases the hip screw.  We then made a separate incision on the thigh laterally and we were able to remove the compression component of the screw, but we could not back out the large compression.  We were unable to remove the small compression screw component of the larger hip screw, but we could not remove the hip screw on multiple attempts, we tried to remove this, but it was stripped and there was nothing we could do to get the screw out.  We then removed inflamed tissue in this part of the hip as well.  We then irrigated the both incisions with normal saline solution.  We closed the deep tissue with 0 Vicryl followed by 2-0 Vicryl  in the subcutaneous tissue and interrupted staples on the skin. Xeroform and well-padded sterile dressing were applied.  He was awakened, extubated and taken to the recovery room in stable condition. All final counts were correct.  There were no complications noted. Postoperatively, we will keep him overnight for observation with weightbearing as tolerated and let him go home tomorrow.     Lind Guest. Ninfa Linden, M.D.     CYB/MEDQ  D:  07/30/2015  T:  07/31/2015  Job:  [id number]

## 2015-07-31 NOTE — Discharge Summary (Signed)
Patient ID: NIKESH BUSBY MRN: [id number] DOB/AGE: June 01, 1933 82 y.o.  Admit date: 07/30/2015 Discharge date: 07/31/2015  Admission Diagnoses:  Principal Problem:   Painful orthopaedic hardware left hip with chronic trochanteric bursitis Active Problems:   Hip bursitis   Trochanteric bursitis of left hip   Discharge Diagnoses:  Same  Past Medical History  Diagnosis Date  . Adenomatous colon polyp   . Hemorrhoids   . Diverticulosis   . Gastritis   . IC (interstitial cystitis)   . Esophageal stricture   . Diverticulitis   . Hyperlipidemia     doesn't require meds  . Coronary artery disease   . Joint pain   . Joint swelling   . GERD (gastroesophageal reflux disease)     takes Pantoprazole daily  . History of colon polyps   . Urinary frequency   . Nocturia   . Insomnia     takes Nortrypyline nightly  . Hiatal hernia   . Esophageal dysmotility   . Paroxysmal atrial fibrillation (HCC)   . Dysrhythmia     bradycardia in the 30's (01/11/2012).. Afib briefly after CABG  . Gout   . Dyspnea on exertion   . OSA (obstructive sleep apnea)     "mild; tried CPAP; couldn't tolerate" (09/01/2014)  . Type II diabetes mellitus ([place])     "borderline"  . Chronic bronchitis ([place])     "get it pretty much q yr" (09/01/2014)  . Bruises easily   . Arthritis     "knees; bad in my back" (09/01/2014)  . Chronic lower back pain   . Sciatic nerve pain   . Depression     "related to health" (09/01/2014)  . Hypertension     takes Losartan daily; current not taking   . Alpha-1-antitrypsin deficiency ([place])   . Anxiety     Surgeries: Procedure(s): attempted LEFT HIP HARDWARE REMOVAL, LEFT HIP BURSECTOMY EXCISION/RELEASE BURSA HIP on 07/30/2015   Consultants:    Discharged Condition: Improved  Hospital Course: DEMAREON RUANE is an 82 y.o. male who was admitted 07/30/2015 for operative treatment ofPainful orthopaedic hardware. Patient has severe unremitting pain that affects sleep,  daily activities, and work/hobbies. After pre-op clearance the patient was taken to the operating room on 07/30/2015 and underwent  Procedure(s): attempted LEFT HIP HARDWARE REMOVAL, LEFT HIP BURSECTOMY EXCISION/RELEASE BURSA HIP.    Patient was given perioperative antibiotics: Anti-infectives    Start     Dose/Rate Route Frequency Ordered Stop   07/30/15 2000  ceFAZolin (ANCEF) IVPB 1 g/50 mL premix     1 g 100 mL/hr over 30 Minutes Intravenous Every 6 hours 07/30/15 1832 07/31/15 0919   07/30/15 1315  ceFAZolin (ANCEF) IVPB 2g/100 mL premix     2 g 200 mL/hr over 30 Minutes Intravenous  Once 07/30/15 1307 07/30/15 1414       Patient was given sequential compression devices, early ambulation, and chemoprophylaxis to prevent DVT.  Patient benefited maximally from hospital stay and there were no complications.    Recent vital signs: Patient Vitals for the past 24 hrs:  BP Temp Temp src Pulse Resp SpO2 Height Weight  07/31/15 0525 109/62 mmHg 97.5 F (36.4 C) Oral 65 16 97 % - -  07/31/15 0409 - - - 69 - 95 % - -  07/31/15 0245 - - - 68 - 95 % - -  07/30/15 2248 112/63 mmHg 97.8 F (36.6 C) Oral 64 16 98 % - -  07/30/15 2056 - 97.6 F (36.4  C) Oral 70 15 93 % - -  07/30/15 1950 131/73 mmHg 98.1 F (36.7 C) Oral 65 13 98 % - -  07/30/15 1840 128/75 mmHg 98.3 F (36.8 C) - 66 12 - - -  07/30/15 1825 118/70 mmHg 97.9 F (36.6 C) - - 12 - - -  07/30/15 1815 118/70 mmHg - - 67 12 97 % - -  07/30/15 1800 126/79 mmHg - - 69 17 99 % - -  07/30/15 1745 125/76 mmHg - - 72 15 100 % - -  07/30/15 1724 120/79 mmHg 97.7 F (36.5 C) - 70 12 100 % - -  07/30/15 1239 - - - - - - 6\' 3"  (1.905 m) 110.678 kg (244 lb)  07/30/15 1205 139/82 mmHg 98 F (36.7 C) Oral 78 18 97 % - -     Recent laboratory studies: No results for input(s): WBC, HGB, HCT, PLT, NA, K, CL, CO2, BUN, CREATININE, GLUCOSE, INR, CALCIUM in the last 72 hours.  Invalid input(s): PT, 2   Discharge Medications:      Medication List    STOP taking these medications        diclofenac sodium 1 % Gel  Commonly known as:  VOLTAREN      TAKE these medications        allopurinol 300 MG tablet  Commonly known as:  ZYLOPRIM  Take 300 mg by mouth daily.     aspirin EC 81 MG tablet  Take 81 mg by mouth daily.     atorvastatin 40 MG tablet  Commonly known as:  LIPITOR  Take 40 mg by mouth daily.     calcium carbonate 750 MG chewable tablet  Commonly known as:  TUMS EX  Chew 3-4 tablets by mouth daily.     carvedilol 12.5 MG tablet  Commonly known as:  COREG  Take 0.5 tablets by mouth 2 (two) times daily.     FIBER SELECT GUMMIES PO  Take 3-4 tablets by mouth daily.     FLOVENT HFA 110 MCG/ACT inhaler  Generic drug:  fluticasone  Inhale 1-2 puffs into the lungs 2 (two) times daily as needed (For shortness of breath.).     KOMBIGLYZE XR 2.07-998 MG Tb24  Generic drug:  Saxagliptin-Metformin  Take 1 tablet by mouth daily.     loperamide 2 MG capsule  Commonly known as:  IMODIUM  Take 2 mg by mouth as needed for diarrhea or loose stools.     methocarbamol 500 MG tablet  Commonly known as:  ROBAXIN  Take 1 tablet (500 mg total) by mouth every 6 (six) hours as needed for muscle spasms.     multivitamin with minerals tablet  Take 1 tablet by mouth daily.     naproxen 500 MG tablet  Commonly known as:  NAPROSYN  Take 500 mg by mouth 3 (three) times daily as needed (pain).     OVER THE COUNTER MEDICATION  Apply 1 application topically daily as needed (For fungal infection.). Ciclopirox 8% Topical Solution     oxyCODONE 10 mg 12 hr tablet  Commonly known as:  OXYCONTIN  Take 1 tablet (10 mg total) by mouth every 12 (twelve) hours.     oxyCODONE-acetaminophen 5-325 MG tablet  Commonly known as:  PERCOCET/ROXICET  Take 1-2 tablets by mouth every 4 (four) hours as needed (For pain.).     pantoprazole 40 MG tablet  Commonly known as:  PROTONIX  Take 1 tablet (40 mg total) by mouth  2  (two) times daily.     ranitidine 150 MG tablet  Commonly known as:  ZANTAC  Take 150 mg by mouth 2 (two) times daily.     tadalafil 5 MG tablet  Commonly known as:  CIALIS  Take 5 mg by mouth daily. For interstitial cystitis.     VENTOLIN HFA 108 (90 Base) MCG/ACT inhaler  Generic drug:  albuterol  Inhale 2 puffs into the lungs every 6 (six) hours as needed for wheezing.        Diagnostic Studies: Mr Cervical Spine Wo Contrast  07/15/2015  CLINICAL DATA:  Left-sided neck pain for 4-5 months. Cervical spondylosis. EXAM: MRI CERVICAL SPINE WITHOUT CONTRAST TECHNIQUE: Multiplanar, multisequence MR imaging of the cervical spine was performed. No intravenous contrast was administered. COMPARISON:  None. FINDINGS: Mild motion artifact throughout. There is trace anterolisthesis of C7 on T1 and T1 on T2, likely degenerative. Vertebral body heights are preserved. There is ankylosis across the posterior aspect of the C3-4 disc space as well as across both C3-4 facet joints. Disc space narrowing is mild at C4-5 and C7-T1, moderate at C5-6, and severe at T1-2. No significant vertebral marrow edema is identified. Prominent degenerative changes are noted at C1-2 on the left. The cervical spinal cord is normal in caliber. Assessment for spinal cord signal abnormality is mildly limited due to motion artifact. No sizable cord signal abnormality is identified, although 2 punctate foci of T2 hyperintensity are questioned centrally in the cord at C4-5 and C6-7, possibly minimal focal prominence of the central canal. Paraspinal soft tissues are unremarkable. C2-3:  Mild facet arthrosis without significant stenosis. C3-4: Diffuse posterior endplate spurring and facet arthrosis result in mild right neural foraminal stenosis without spinal stenosis. C4-5: Central disc protrusion, uncovertebral spurring, and advanced facet arthrosis result in mild-to-moderate bilateral neural foraminal stenosis without significant spinal  stenosis. C5-6: Broad-based posterior disc osteophyte complex, uncovertebral spurring, and advanced facet arthrosis result in moderate right and mild left neural foraminal stenosis and borderline spinal stenosis. C6-7: Tiny central disc protrusion and left greater than right facet arthrosis without significant stenosis. C7-T1: Listhesis with bulging, uncovered disc, and advanced facet arthrosis result in moderate right neural foraminal stenosis without significant spinal stenosis. T2-3: Only imaged sagittally. Disc bulging, endplate spurring, and mild facet arthrosis result in mild bilateral neural foraminal narrowing. T2-3: Disc bulging, endplate spurring, and mild facet arthrosis result in minimal bilateral neural foraminal narrowing. IMPRESSION: 1. Moderate multilevel disc and advanced facet degeneration throughout the cervical spine. No significant spinal stenosis or cord impingement. 2. Mild-to-moderate multilevel neural foraminal stenosis as above. Electronically Signed   By: Logan Bores M.D.   On: 07/15/2015 17:30   Dg C-arm Gt 120 Min-no Report  07/30/2015  CLINICAL DATA: surg C-ARM GT 120 MINUTE Fluoroscopy was utilized by the requesting physician.  No radiographic interpretation.    Disposition: 01-Home or Self Care      Discharge Instructions    Discharge patient    Complete by:  As directed            Follow-up Information    Follow up with Mcarthur Rossetti, MD.   Specialty:  Orthopedic Surgery   Contact information:   [place] [address] [phone]        Signed: Mcarthur Rossetti 07/31/2015, 9:41 AM

## 2015-08-02 ENCOUNTER — Encounter (HOSPITAL_COMMUNITY): Payer: Self-pay | Admitting: Orthopaedic Surgery

## 2015-09-07 ENCOUNTER — Ambulatory Visit: Payer: Medicare Other | Admitting: Oncology

## 2015-09-24 ENCOUNTER — Telehealth: Payer: Self-pay | Admitting: Oncology

## 2015-09-24 NOTE — Telephone Encounter (Signed)
sw. pt pcp gv new appt....pt ok and aware

## 2015-10-13 ENCOUNTER — Other Ambulatory Visit: Payer: Self-pay | Admitting: Cardiovascular Disease

## 2015-10-13 ENCOUNTER — Other Ambulatory Visit: Payer: Self-pay

## 2015-10-22 ENCOUNTER — Telehealth: Payer: Self-pay | Admitting: Oncology

## 2015-10-22 NOTE — Telephone Encounter (Signed)
pt cld to CX appt-tinks this was sch in error-he ill call back to sch if needed

## 2015-10-26 ENCOUNTER — Ambulatory Visit: Payer: Medicare Other | Admitting: Oncology

## 2015-11-26 ENCOUNTER — Other Ambulatory Visit: Payer: Self-pay

## 2015-11-26 ENCOUNTER — Other Ambulatory Visit: Payer: Self-pay | Admitting: Cardiovascular Disease

## 2015-11-26 MED ORDER — ATORVASTATIN CALCIUM 40 MG PO TABS: ORAL_TABLET | ORAL | 9 refills | Status: DC

## 2015-12-16 ENCOUNTER — Encounter (HOSPITAL_COMMUNITY)
Admission: RE | Admit: 2015-12-16 | Discharge: 2015-12-16 | Disposition: A | Discharge status: Home / Self Care | Payer: Medicare Other | Source: Ambulatory Visit | Attending: Orthopaedic Surgery | Admitting: Orthopaedic Surgery

## 2015-12-16 ENCOUNTER — Encounter (HOSPITAL_COMMUNITY)
Admission: RE | Admit: 2015-12-16 | Discharge: 2015-12-16 | Disposition: A | Discharge status: Home / Self Care | Payer: Medicare Other | Source: Ambulatory Visit | Attending: Surgery | Admitting: Surgery

## 2015-12-16 ENCOUNTER — Other Ambulatory Visit (HOSPITAL_COMMUNITY): Payer: Self-pay | Admitting: *Deleted

## 2015-12-16 ENCOUNTER — Encounter (HOSPITAL_COMMUNITY): Payer: Self-pay

## 2015-12-16 DIAGNOSIS — Z01818 Encounter for other preprocedural examination: Secondary | ICD-10-CM | POA: Diagnosis not present

## 2015-12-16 DIAGNOSIS — Z01810 Encounter for preprocedural cardiovascular examination: Secondary | ICD-10-CM | POA: Diagnosis present

## 2015-12-16 DIAGNOSIS — Z01812 Encounter for preprocedural laboratory examination: Secondary | ICD-10-CM | POA: Diagnosis not present

## 2015-12-16 DIAGNOSIS — M47812 Spondylosis without myelopathy or radiculopathy, cervical region: Secondary | ICD-10-CM | POA: Insufficient documentation

## 2015-12-16 DIAGNOSIS — Z951 Presence of aortocoronary bypass graft: Secondary | ICD-10-CM | POA: Diagnosis not present

## 2015-12-16 HISTORY — DX: Macular cyst, hole, or pseudohole, bilateral: H35.343

## 2015-12-16 HISTORY — DX: Restless legs syndrome: G25.81

## 2015-12-16 HISTORY — DX: Diarrhea, unspecified: R19.7

## 2015-12-16 HISTORY — DX: Genetic carrier of other disease: Z14.8

## 2015-12-16 HISTORY — DX: Anemia, unspecified: D64.9

## 2015-12-16 LAB — URINALYSIS, ROUTINE W REFLEX MICROSCOPIC
GLUCOSE, UA: NEGATIVE mg/dL
Hgb urine dipstick: NEGATIVE
KETONES UR: 15 mg/dL — AB
LEUKOCYTES UA: NEGATIVE
NITRITE: NEGATIVE
Protein, ur: NEGATIVE mg/dL
SPECIFIC GRAVITY, URINE: 1.038 — AB (ref 1.005–1.030)
pH: 5 (ref 5.0–8.0)

## 2015-12-16 LAB — CBC
HEMATOCRIT: 34.7 % — AB (ref 39.0–52.0)
HEMOGLOBIN: 11.6 g/dL — AB (ref 13.0–17.0)
MCH: 31.1 pg (ref 26.0–34.0)
MCHC: 33.4 g/dL (ref 30.0–36.0)
MCV: 93 fL (ref 78.0–100.0)
Platelets: 153 10*3/uL (ref 150–400)
RBC: 3.73 MIL/uL — AB (ref 4.22–5.81)
RDW: 16.9 % — ABNORMAL HIGH (ref 11.5–15.5)
WBC: 5.1 10*3/uL (ref 4.0–10.5)

## 2015-12-16 LAB — COMPREHENSIVE METABOLIC PANEL
ALBUMIN: 4.1 g/dL (ref 3.5–5.0)
ALT: 21 U/L (ref 17–63)
AST: 22 U/L (ref 15–41)
Alkaline Phosphatase: 55 U/L (ref 38–126)
Anion gap: 8 (ref 5–15)
BILIRUBIN TOTAL: 1.2 mg/dL (ref 0.3–1.2)
BUN: 23 mg/dL — AB (ref 6–20)
CO2: 21 mmol/L — ABNORMAL LOW (ref 22–32)
CREATININE: 1.25 mg/dL — AB (ref 0.61–1.24)
Calcium: 9.2 mg/dL (ref 8.9–10.3)
Chloride: 110 mmol/L (ref 101–111)
GFR calc Af Amer: >60 mL/min (ref >60)
GFR, EST NON AFRICAN AMERICAN: 53 mL/min — AB (ref >60)
GLUCOSE: 107 mg/dL — AB (ref 65–99)
Potassium: 4.5 mmol/L (ref 3.5–5.1)
Sodium: 139 mmol/L (ref 135–145)
TOTAL PROTEIN: 6.3 g/dL — AB (ref 6.5–8.1)

## 2015-12-16 LAB — SURGICAL PCR SCREEN
MRSA, PCR: NEGATIVE
Staphylococcus aureus: NEGATIVE

## 2015-12-16 LAB — PROTIME-INR
INR: 1.18
PROTHROMBIN TIME: 15.1 s (ref 11.4–15.2)

## 2015-12-16 LAB — GLUCOSE, CAPILLARY: Glucose-Capillary: 114 mg/dL — ABNORMAL HIGH (ref 65–99)

## 2015-12-16 LAB — APTT: APTT: 30 s (ref 24–36)

## 2015-12-16 NOTE — Progress Notes (Signed)
Anesthesia Chart Review:  Pt is a 82 year old male scheduled for C5-6 ACDF on 12/20/2015 with Rodell Perna, MD.   - Cardiologist is Quay Burow, MD, last office visit 06/23/15 - Pulmonologist is Loreta Ave, MD, last office visit 12/07/15 (notes in care everywhere) - PCP is Deland Pretty, MD.   PMH includes:  CAD (s/p CABG x4 2013), PAF (after CABG, now in SR), HTN, DM, hyperlipidemia, alpha-1-antitrypsin deficiency, OSA, GERD.  Never smoker. BMI 32. S/p L hip bursectomy, attempted hardware removal 07/30/15.    Medications include: ASA, lipitor, carvedilol, protonix, zantac, saxaglipitin-metformin, tadalafil  Preoperative labs reviewed.  HgbA1c pending.   CXR 12/16/15: No active cardiopulmonary disease.  EKG 12/16/15: NSR  Echo 04/08/13:  - Left ventricle: The cavity size was normal. Wall thickness was increased in a pattern of severe LVH. Systolic function was normal. The estimated ejection fraction wasin the range of 55% to 60%. - Mitral valve: Mild regurgitation. - Left atrium: The atrium was moderately dilated. - Atrial septum: No defect or patent foramen ovale wasidentified.  Nuclear stress test 04/01/13: Intermediate risk stress nuclear study with a small area of anteroseptal reversibility. There is a fixed inferoapical defect. SDS 4. Extent 6%. LV Wall Motion:  Anteroseptal hypokinesis. EF 51%.  Called to see pt in PAT for chest discomfort on and off for last 4 weeks. None today. Occurs around 2x per week, up to 3x per day on days when it happens.  Usually happens at rest.  Describes chest sx as "stuffiness" or "fullness".  Pt reports he has had similar pain in the past and it has been attributable to his hiatal hernia, although recent sx have been much milder than previous sx.  No other sx associated with chest sx.  Pt will sometimes take Tums when sx occur and he feels this helps to relieve sx.  Sometimes he doesn't use Tums and sx spontaneously resolve.  Sx never last more than 30  minutes at at time.  Pt did 6 minute walk test at pulmonologist's office 12/07/15 (see care everywhere) and did not experience chest discomfort.  Pt walked from parking deck to PAT today without experiencing chest discomfort.    Reviewed case with Dr. Gifford Shave.   If no changes, I anticipate pt can proceed with surgery as scheduled.   Willeen Cass, FNP-BC [facility]/Anesthesiology Phone: [phone] 12/16/2015 4:39 PM

## 2015-12-16 NOTE — Progress Notes (Addendum)
When pt came in for PAT visit he asked if he was having an EKG done today. I told him that we didn't need to do one today since he had one done in April, but I asked if there was a reason we needed to do one. He stated that he's been having some chest discomfort. He describes it as a "stuffiness" in his chest that he's been getting intermittently for the past 6 weeks. States he's usually at rest and he'll take up to 5 Tums and it goes away. The longest time it bothers him is 30 minutes. He denies diaphoresis, sob or radiation into arm or neck. He states he thinks it's his hiatal hernia. Has not talked with Dr. Gwenlyn Found about this. Last office visit with Dr. Gwenlyn Found was in April. Pt is diabetic, he states "borderline" but is on Kombiglyze daily. States fasting blood sugar is usually between 109-120. Last A1C was 6.2 in May, 2017.  Willeen Cass, NP will see pt prior to him leaving.

## 2015-12-16 NOTE — Progress Notes (Signed)
   12/16/15 1420  OBSTRUCTIVE SLEEP APNEA  Have you ever been diagnosed with sleep apnea through a sleep study? No  Do you snore loudly (loud enough to be heard through closed doors)?  1  Do you often feel tired, fatigued, or sleepy during the daytime (such as falling asleep during driving or talking to someone)? 1  Has anyone observed you stop breathing during your sleep? 0  Do you have, or are you being treated for high blood pressure? 1  BMI more than 35 kg/m2? 0  Age > 35 (1-yes) 1  Male Gender (Yes=1) 1  Obstructive Sleep Apnea Score 5  Score 5 or greater  Results sent to PCP

## 2015-12-16 NOTE — Pre-Procedure Instructions (Addendum)
Lucas Morales  [id number]     Your procedure is scheduled on Monday, December 20, 2015 at 12:30 PM.   Report to [facility] Entrance "A" Admitting Office at 10:30 AM.   Call this number if you have problems the morning of surgery: [phone]   Questions prior to day of surgery, please call [phone] between 8 & 4 PM.   Remember:  Do not eat food or drink liquids after midnight Sunday, 12/19/15.  Take these medicines the morning of surgery with A SIP OF WATER: Carvedilol (Coreg), Pantoprazole (Protonix), Ranitidine (Zantac)  Stop Aspirin, NSAIDS (Naprosyn, Aleve, Ibuprofen, etc.) and Multivitamins as of today.   How to Manage Your Diabetes Before Surgery   Why is it important to control my blood sugar before and after surgery?   Improving blood sugar levels before and after surgery helps healing and can limit problems.  A way of improving blood sugar control is eating a healthy diet by:  - Eating less sugar and carbohydrates  - Increasing activity/exercise  - Talk with your doctor about reaching your blood sugar goals  High blood sugars (greater than 180 mg/dL) can raise your risk of infections and slow down your recovery so you will need to focus on controlling your diabetes during the weeks before surgery.  Make sure that the doctor who takes care of your diabetes knows about your planned surgery including the date and location.  How do I manage my blood sugars before surgery?   Check your blood sugar at least 4 times a day, 2 days before surgery to make sure that they are not too high or low.  Check your blood sugar the morning of your surgery when you wake up and every 2 hours until you get to the Short-Stay unit.  Treat a low blood sugar (less than 70 mg/dL) with 1/2 cup of clear juice (cranberry or apple), 4 glucose tablets, OR glucose gel.  Recheck blood sugar in 15 minutes after treatment (to make sure it is greater than 70 mg/dL).  If blood sugar  is not greater than 70 mg/dL on re-check, call [phone] for further instructions.   Report your blood sugar to the Short-Stay nurse when you get to Short-Stay.  References:  [facility], 2007 "How to Manage your Diabetes Before and After Surgery".  What do I do about my diabetes medications?   Do not take oral diabetes medicines (pills) the morning of surgery.   Do not wear jewelry.  Do not wear lotions, powders, or cologne.  Men may shave face and neck.  Do not bring valuables to the hospital.  [facility] is not responsible for any belongings or valuables.  Contacts, dentures or bridgework may not be worn into surgery.  Leave your suitcase in the car.  After surgery it may be brought to your room.  For patients admitted to the hospital, discharge time will be determined by your treatment team.   Special instructions:  [place] - Preparing for Surgery  Before surgery, you can play an important role.  Because skin is not sterile, your skin needs to be as free of germs as possible.  You can reduce the number of germs on you skin by washing with CHG (chlorahexidine gluconate) soap before surgery.  CHG is an antiseptic cleaner which kills germs and bonds with the skin to continue killing germs even after washing.  Please DO NOT use if you have an allergy to CHG or  antibacterial soaps.  If your skin becomes reddened/irritated stop using the CHG and inform your nurse when you arrive at Short Stay.  Do not shave (including legs and underarms) for at least 48 hours prior to the first CHG shower.  You may shave your face.  Please follow these instructions carefully:   1.  Shower with CHG Soap the night before surgery and the                    morning of Surgery.  2.  If you choose to wash your hair, wash your hair first as usual with your       normal shampoo.  3.  After you shampoo, rinse your hair and body thoroughly to remove the shampoo.  4.  Use CHG  as you would any other liquid soap.  You can apply chg directly       to the skin and wash gently with scrungie or a clean washcloth.  5.  Apply the CHG Soap to your body ONLY FROM THE NECK DOWN.        Do not use on open wounds or open sores.  Avoid contact with your eyes, ears, mouth and genitals (private parts).  Wash genitals (private parts) with your normal soap.  6.  Wash thoroughly, paying special attention to the area where your surgery        will be performed.  7.  Thoroughly rinse your body with warm water from the neck down.  8.  DO NOT shower/wash with your normal soap after using and rinsing off       the CHG Soap.  9.  Pat yourself dry with a clean towel.            10.  Wear clean pajamas.            11.  Place clean sheets on your bed the night of your first shower and do not        sleep with pets.  Day of Surgery  Do not apply any lotions the morning of surgery.  Please wear clean clothes to the hospital.   Please read over the following fact sheets that you were given. Pain Booklet, Coughing and Deep Breathing and Surgical Site Infection Prevention

## 2015-12-17 LAB — HEMOGLOBIN A1C
HEMOGLOBIN A1C: 5.8 % — AB (ref 4.8–5.6)
Mean Plasma Glucose: 120 mg/dL

## 2015-12-20 ENCOUNTER — Inpatient Hospital Stay (HOSPITAL_COMMUNITY)
Admission: RE | Admit: 2015-12-20 | Discharge: 2015-12-21 | DRG: 473 | Disposition: A | Discharge status: Home / Self Care | Payer: Medicare Other | Source: Ambulatory Visit | Attending: Orthopaedic Surgery | Admitting: Orthopaedic Surgery

## 2015-12-20 ENCOUNTER — Encounter (HOSPITAL_COMMUNITY)
Admission: RE | Disposition: A | Discharge status: Home / Self Care | Payer: Self-pay | Source: Ambulatory Visit | Attending: Orthopaedic Surgery

## 2015-12-20 ENCOUNTER — Inpatient Hospital Stay (HOSPITAL_COMMUNITY): Payer: Medicare Other

## 2015-12-20 ENCOUNTER — Encounter (HOSPITAL_COMMUNITY): Payer: Self-pay | Admitting: *Deleted

## 2015-12-20 ENCOUNTER — Inpatient Hospital Stay (HOSPITAL_COMMUNITY): Payer: Medicare Other | Admitting: Certified Registered Nurse Anesthetist

## 2015-12-20 ENCOUNTER — Inpatient Hospital Stay (HOSPITAL_COMMUNITY): Payer: Medicare Other | Admitting: Emergency Medicine

## 2015-12-20 DIAGNOSIS — K219 Gastro-esophageal reflux disease without esophagitis: Secondary | ICD-10-CM | POA: Diagnosis not present

## 2015-12-20 DIAGNOSIS — E119 Type 2 diabetes mellitus without complications: Secondary | ICD-10-CM | POA: Diagnosis not present

## 2015-12-20 DIAGNOSIS — Z951 Presence of aortocoronary bypass graft: Secondary | ICD-10-CM

## 2015-12-20 DIAGNOSIS — I2510 Atherosclerotic heart disease of native coronary artery without angina pectoris: Secondary | ICD-10-CM | POA: Diagnosis present

## 2015-12-20 DIAGNOSIS — Z9842 Cataract extraction status, left eye: Secondary | ICD-10-CM | POA: Diagnosis not present

## 2015-12-20 DIAGNOSIS — F329 Major depressive disorder, single episode, unspecified: Secondary | ICD-10-CM | POA: Diagnosis present

## 2015-12-20 DIAGNOSIS — G4733 Obstructive sleep apnea (adult) (pediatric): Secondary | ICD-10-CM | POA: Diagnosis not present

## 2015-12-20 DIAGNOSIS — I480 Paroxysmal atrial fibrillation: Secondary | ICD-10-CM | POA: Diagnosis not present

## 2015-12-20 DIAGNOSIS — M47812 Spondylosis without myelopathy or radiculopathy, cervical region: Secondary | ICD-10-CM | POA: Diagnosis present

## 2015-12-20 DIAGNOSIS — F419 Anxiety disorder, unspecified: Secondary | ICD-10-CM | POA: Diagnosis present

## 2015-12-20 DIAGNOSIS — M4722 Other spondylosis with radiculopathy, cervical region: Principal | ICD-10-CM | POA: Diagnosis present

## 2015-12-20 DIAGNOSIS — E785 Hyperlipidemia, unspecified: Secondary | ICD-10-CM | POA: Diagnosis not present

## 2015-12-20 DIAGNOSIS — I10 Essential (primary) hypertension: Secondary | ICD-10-CM | POA: Diagnosis present

## 2015-12-20 DIAGNOSIS — Z96653 Presence of artificial knee joint, bilateral: Secondary | ICD-10-CM | POA: Diagnosis not present

## 2015-12-20 DIAGNOSIS — Z961 Presence of intraocular lens: Secondary | ICD-10-CM | POA: Diagnosis present

## 2015-12-20 DIAGNOSIS — G4700 Insomnia, unspecified: Secondary | ICD-10-CM | POA: Diagnosis present

## 2015-12-20 DIAGNOSIS — E8801 Alpha-1-antitrypsin deficiency: Secondary | ICD-10-CM | POA: Diagnosis not present

## 2015-12-20 DIAGNOSIS — G2581 Restless legs syndrome: Secondary | ICD-10-CM | POA: Diagnosis not present

## 2015-12-20 DIAGNOSIS — Z9841 Cataract extraction status, right eye: Secondary | ICD-10-CM

## 2015-12-20 DIAGNOSIS — M4802 Spinal stenosis, cervical region: Secondary | ICD-10-CM | POA: Diagnosis present

## 2015-12-20 DIAGNOSIS — Z419 Encounter for procedure for purposes other than remedying health state, unspecified: Secondary | ICD-10-CM

## 2015-12-20 DIAGNOSIS — M109 Gout, unspecified: Secondary | ICD-10-CM | POA: Diagnosis present

## 2015-12-20 DIAGNOSIS — M542 Cervicalgia: Secondary | ICD-10-CM | POA: Diagnosis present

## 2015-12-20 HISTORY — PX: ANTERIOR CERVICAL DECOMP/DISCECTOMY FUSION: SHX1161

## 2015-12-20 LAB — GLUCOSE, CAPILLARY: GLUCOSE-CAPILLARY: 128 mg/dL — AB (ref 65–99)

## 2015-12-20 SURGERY — ANTERIOR CERVICAL DECOMPRESSION/DISCECTOMY FUSION 1 LEVEL
Anesthesia: General | Site: Spine Cervical

## 2015-12-20 MED ORDER — PHENOL 1.4 % MT LIQD: 1.0000 | OROMUCOSAL | Status: DC | PRN

## 2015-12-20 MED ORDER — PROMETHAZINE HCL 25 MG/ML IJ SOLN: 6.2500 mg | INTRAMUSCULAR | Status: DC | PRN

## 2015-12-20 MED ORDER — LIDOCAINE 2% (20 MG/ML) 5 ML SYRINGE
INTRAMUSCULAR | Status: AC
  Filled 2015-12-20: qty 10

## 2015-12-20 MED ORDER — MIDAZOLAM HCL 2 MG/2ML IJ SOLN
INTRAMUSCULAR | Status: AC
  Filled 2015-12-20: qty 2

## 2015-12-20 MED ORDER — PROPOFOL 10 MG/ML IV BOLUS
INTRAVENOUS | Status: AC
  Filled 2015-12-20: qty 20

## 2015-12-20 MED ORDER — SODIUM CHLORIDE 0.9% FLUSH
3.0000 mL | Freq: Two times a day (BID) | INTRAVENOUS | Status: DC
  Administered 2015-12-20: 3 mL via INTRAVENOUS

## 2015-12-20 MED ORDER — DEXAMETHASONE SODIUM PHOSPHATE 10 MG/ML IJ SOLN
INTRAMUSCULAR | Status: DC | PRN
  Administered 2015-12-20: 10 mg via INTRAVENOUS

## 2015-12-20 MED ORDER — HYDROMORPHONE HCL 1 MG/ML IJ SOLN: 0.5000 mg | INTRAMUSCULAR | Status: DC | PRN

## 2015-12-20 MED ORDER — SUCCINYLCHOLINE CHLORIDE 200 MG/10ML IV SOSY
PREFILLED_SYRINGE | INTRAVENOUS | Status: AC
  Filled 2015-12-20: qty 10

## 2015-12-20 MED ORDER — PHENYLEPHRINE 40 MCG/ML (10ML) SYRINGE FOR IV PUSH (FOR BLOOD PRESSURE SUPPORT)
PREFILLED_SYRINGE | INTRAVENOUS | Status: AC
  Filled 2015-12-20: qty 20

## 2015-12-20 MED ORDER — CHLORHEXIDINE GLUCONATE 4 % EX LIQD: 60.0000 mL | Freq: Once | CUTANEOUS | Status: DC

## 2015-12-20 MED ORDER — ROCURONIUM BROMIDE 10 MG/ML (PF) SYRINGE
PREFILLED_SYRINGE | INTRAVENOUS | Status: AC
  Filled 2015-12-20: qty 20

## 2015-12-20 MED ORDER — MENTHOL 3 MG MT LOZG
1.0000 | LOZENGE | OROMUCOSAL | Status: DC | PRN
  Administered 2015-12-20: 3 mg via ORAL
  Filled 2015-12-20: qty 9

## 2015-12-20 MED ORDER — FENTANYL CITRATE (PF) 100 MCG/2ML IJ SOLN
INTRAMUSCULAR | Status: AC
  Filled 2015-12-20: qty 4

## 2015-12-20 MED ORDER — 0.9 % SODIUM CHLORIDE (POUR BTL) OPTIME
TOPICAL | Status: DC | PRN
  Administered 2015-12-20: 1000 mL

## 2015-12-20 MED ORDER — CEFAZOLIN SODIUM-DEXTROSE 2-4 GM/100ML-% IV SOLN
2.0000 g | INTRAVENOUS | Status: AC
  Administered 2015-12-20: 2 g via INTRAVENOUS
  Filled 2015-12-20: qty 100

## 2015-12-20 MED ORDER — POLYETHYLENE GLYCOL 3350 17 G PO PACK: 17.0000 g | PACK | Freq: Every day | ORAL | Status: DC | PRN

## 2015-12-20 MED ORDER — ONDANSETRON HCL 4 MG/2ML IJ SOLN
INTRAMUSCULAR | Status: DC | PRN
  Administered 2015-12-20: 4 mg via INTRAVENOUS

## 2015-12-20 MED ORDER — MIDAZOLAM HCL 2 MG/2ML IJ SOLN: 0.5000 mg | Freq: Once | INTRAMUSCULAR | Status: DC | PRN

## 2015-12-20 MED ORDER — DOCUSATE SODIUM 100 MG PO CAPS
100.0000 mg | ORAL_CAPSULE | Freq: Two times a day (BID) | ORAL | Status: DC
  Filled 2015-12-20 (×2): qty 1

## 2015-12-20 MED ORDER — ACETAMINOPHEN 650 MG RE SUPP: 650.0000 mg | RECTAL | Status: DC | PRN

## 2015-12-20 MED ORDER — PHENYLEPHRINE HCL 10 MG/ML IJ SOLN
INTRAVENOUS | Status: DC | PRN
  Administered 2015-12-20: 20 ug/min via INTRAVENOUS

## 2015-12-20 MED ORDER — HEMOSTATIC AGENTS (NO CHARGE) OPTIME
TOPICAL | Status: DC | PRN
  Administered 2015-12-20: 1 via TOPICAL

## 2015-12-20 MED ORDER — DEXAMETHASONE SODIUM PHOSPHATE 10 MG/ML IJ SOLN
INTRAMUSCULAR | Status: AC
  Filled 2015-12-20: qty 2

## 2015-12-20 MED ORDER — LIDOCAINE 2% (20 MG/ML) 5 ML SYRINGE
INTRAMUSCULAR | Status: DC | PRN
  Administered 2015-12-20: 40 mg via INTRAVENOUS

## 2015-12-20 MED ORDER — LACTATED RINGERS IV SOLN
INTRAVENOUS | Status: DC
  Administered 2015-12-20 (×2): via INTRAVENOUS

## 2015-12-20 MED ORDER — ROCURONIUM BROMIDE 10 MG/ML (PF) SYRINGE
PREFILLED_SYRINGE | INTRAVENOUS | Status: DC | PRN
  Administered 2015-12-20: 30 mg via INTRAVENOUS
  Administered 2015-12-20: 5 mg via INTRAVENOUS
  Administered 2015-12-20: 35 mg via INTRAVENOUS

## 2015-12-20 MED ORDER — ACETAMINOPHEN 325 MG PO TABS: 650.0000 mg | ORAL_TABLET | ORAL | Status: DC | PRN

## 2015-12-20 MED ORDER — PROPOFOL 10 MG/ML IV BOLUS
INTRAVENOUS | Status: DC | PRN
  Administered 2015-12-20: 100 mg via INTRAVENOUS

## 2015-12-20 MED ORDER — SODIUM CHLORIDE 0.9% FLUSH: 3.0000 mL | INTRAVENOUS | Status: DC | PRN

## 2015-12-20 MED ORDER — BUPIVACAINE HCL (PF) 0.5 % IJ SOLN
INTRAMUSCULAR | Status: AC
  Filled 2015-12-20: qty 30

## 2015-12-20 MED ORDER — BUPIVACAINE-EPINEPHRINE 0.5% -1:200000 IJ SOLN
INTRAMUSCULAR | Status: DC | PRN
  Administered 2015-12-20: 7 mL

## 2015-12-20 MED ORDER — MIDAZOLAM HCL 5 MG/5ML IJ SOLN
INTRAMUSCULAR | Status: DC | PRN
  Administered 2015-12-20 (×2): 1 mg via INTRAVENOUS

## 2015-12-20 MED ORDER — ONDANSETRON HCL 4 MG/2ML IJ SOLN: 4.0000 mg | INTRAMUSCULAR | Status: DC | PRN

## 2015-12-20 MED ORDER — EPHEDRINE 5 MG/ML INJ
INTRAVENOUS | Status: AC
  Filled 2015-12-20: qty 20

## 2015-12-20 MED ORDER — KETAMINE HCL 10 MG/ML IJ SOLN
INTRAMUSCULAR | Status: DC | PRN
  Administered 2015-12-20: 30 mg via INTRAVENOUS
  Administered 2015-12-20: 50 mg via INTRAVENOUS
  Administered 2015-12-20: 20 mg via INTRAVENOUS

## 2015-12-20 MED ORDER — CEFAZOLIN IN D5W 1 GM/50ML IV SOLN
1.0000 g | Freq: Three times a day (TID) | INTRAVENOUS | Status: AC
  Administered 2015-12-20 – 2015-12-21 (×2): 1 g via INTRAVENOUS
  Filled 2015-12-20 (×2): qty 50

## 2015-12-20 MED ORDER — METHOCARBAMOL 1000 MG/10ML IJ SOLN
500.0000 mg | Freq: Four times a day (QID) | INTRAVENOUS | Status: DC | PRN
  Filled 2015-12-20: qty 5

## 2015-12-20 MED ORDER — FENTANYL CITRATE (PF) 100 MCG/2ML IJ SOLN
INTRAMUSCULAR | Status: AC
  Filled 2015-12-20: qty 2

## 2015-12-20 MED ORDER — MEPERIDINE HCL 25 MG/ML IJ SOLN: 6.2500 mg | INTRAMUSCULAR | Status: DC | PRN

## 2015-12-20 MED ORDER — SODIUM CHLORIDE 0.9 % IV SOLN: INTRAVENOUS | Status: DC

## 2015-12-20 MED ORDER — OXYCODONE-ACETAMINOPHEN 5-325 MG PO TABS
1.0000 | ORAL_TABLET | Freq: Four times a day (QID) | ORAL | Status: DC | PRN
  Administered 2015-12-20 – 2015-12-21 (×2): 2 via ORAL
  Filled 2015-12-20 (×2): qty 2

## 2015-12-20 MED ORDER — FENTANYL CITRATE (PF) 100 MCG/2ML IJ SOLN
INTRAMUSCULAR | Status: DC | PRN
  Administered 2015-12-20 (×2): 50 ug via INTRAVENOUS
  Administered 2015-12-20: 200 ug via INTRAVENOUS

## 2015-12-20 MED ORDER — METHOCARBAMOL 500 MG PO TABS
500.0000 mg | ORAL_TABLET | Freq: Four times a day (QID) | ORAL | Status: DC | PRN
  Administered 2015-12-20 – 2015-12-21 (×2): 500 mg via ORAL
  Filled 2015-12-20 (×2): qty 1

## 2015-12-20 MED ORDER — HYDROMORPHONE HCL 1 MG/ML IJ SOLN: 0.2500 mg | INTRAMUSCULAR | Status: DC | PRN

## 2015-12-20 MED ORDER — ONDANSETRON HCL 4 MG/2ML IJ SOLN
INTRAMUSCULAR | Status: AC
  Filled 2015-12-20: qty 4

## 2015-12-20 MED ORDER — SUGAMMADEX SODIUM 200 MG/2ML IV SOLN
INTRAVENOUS | Status: DC | PRN
  Administered 2015-12-20: 200 mg via INTRAVENOUS

## 2015-12-20 MED ORDER — EPHEDRINE SULFATE 50 MG/ML IJ SOLN
INTRAMUSCULAR | Status: DC | PRN
  Administered 2015-12-20: 15 mg via INTRAVENOUS

## 2015-12-20 MED ORDER — SODIUM CHLORIDE 0.9 % IV SOLN: 250.0000 mL | INTRAVENOUS | Status: DC

## 2015-12-20 SURGICAL SUPPLY — 57 items
BENZOIN TINCTURE PRP APPL 2/3 (GAUZE/BANDAGES/DRESSINGS) ×3 IMPLANT
BIT DRILL SRG 14X2.2XFLT CHK (BIT) ×1 IMPLANT
BIT DRL SRG 14X2.2XFLT CHK (BIT) ×1
BLADE SURG ROTATE 9660 (MISCELLANEOUS) IMPLANT
BONE CERV LORDOTIC 14.5X12X7 (Bone Implant) ×3 IMPLANT
BUR ROUND FLUTED 4 SOFT TCH (BURR) ×2 IMPLANT
BUR ROUND FLUTED 4MM SOFT TCH (BURR) ×1
CANISTER SUCT 3000ML (MISCELLANEOUS) ×3 IMPLANT
CLOSURE STERI-STRIP 1/2X4 (GAUZE/BANDAGES/DRESSINGS) ×1
CLOSURE WOUND 1/2 X4 (GAUZE/BANDAGES/DRESSINGS)
CLSR STERI-STRIP ANTIMIC 1/2X4 (GAUZE/BANDAGES/DRESSINGS) ×2 IMPLANT
COLLAR CERV LO CONTOUR FIRM DE (SOFTGOODS) ×3 IMPLANT
COVER MAYO STAND STRL (DRAPES) ×6 IMPLANT
COVER SURGICAL LIGHT HANDLE (MISCELLANEOUS) ×3 IMPLANT
CRADLE DONUT ADULT HEAD (MISCELLANEOUS) ×3 IMPLANT
DRAPE C-ARM 42X72 X-RAY (DRAPES) ×3 IMPLANT
DRAPE MICROSCOPE LEICA (MISCELLANEOUS) ×3 IMPLANT
DRAPE PROXIMA HALF (DRAPES) ×3 IMPLANT
DRILL BIT SKYLINE 14MM (BIT) ×2
DURAPREP 6ML APPLICATOR 50/CS (WOUND CARE) ×3 IMPLANT
ELECT COATED BLADE 2.86 ST (ELECTRODE) ×3 IMPLANT
ELECT REM PT RETURN 9FT ADLT (ELECTROSURGICAL) ×3
ELECTRODE REM PT RTRN 9FT ADLT (ELECTROSURGICAL) ×1 IMPLANT
EVACUATOR 1/8 PVC DRAIN (DRAIN) ×3 IMPLANT
GAUZE SPONGE 4X4 12PLY STRL (GAUZE/BANDAGES/DRESSINGS) ×3 IMPLANT
GLOVE BIOGEL PI IND STRL 8 (GLOVE) ×2 IMPLANT
GLOVE BIOGEL PI INDICATOR 8 (GLOVE) ×4
GLOVE ORTHO TXT STRL SZ7.5 (GLOVE) ×6 IMPLANT
GOWN STRL REUS W/ TWL LRG LVL3 (GOWN DISPOSABLE) ×1 IMPLANT
GOWN STRL REUS W/ TWL XL LVL3 (GOWN DISPOSABLE) ×1 IMPLANT
GOWN STRL REUS W/TWL 2XL LVL3 (GOWN DISPOSABLE) ×3 IMPLANT
GOWN STRL REUS W/TWL LRG LVL3 (GOWN DISPOSABLE) ×2
GOWN STRL REUS W/TWL XL LVL3 (GOWN DISPOSABLE) ×2
HEAD HALTER (SOFTGOODS) ×3 IMPLANT
KIT BASIN OR (CUSTOM PROCEDURE TRAY) ×3 IMPLANT
KIT ROOM TURNOVER OR (KITS) ×3 IMPLANT
MANIFOLD NEPTUNE II (INSTRUMENTS) IMPLANT
NEEDLE 25GX 5/8IN NON SAFETY (NEEDLE) ×3 IMPLANT
NS IRRIG 1000ML POUR BTL (IV SOLUTION) ×3 IMPLANT
PACK ORTHO CERVICAL (CUSTOM PROCEDURE TRAY) ×3 IMPLANT
PAD ARMBOARD 7.5X6 YLW CONV (MISCELLANEOUS) ×6 IMPLANT
PATTIES SURGICAL .5 X.5 (GAUZE/BANDAGES/DRESSINGS) IMPLANT
PIN TEMP SKYLINE THREADED (PIN) ×3 IMPLANT
PLATE ONE LEVEL SKYLINE 14MM (Plate) ×3 IMPLANT
SCREW VAR SELF TAP SKYLINE 14M (Screw) ×12 IMPLANT
STRIP CLOSURE SKIN 1/2X4 (GAUZE/BANDAGES/DRESSINGS) IMPLANT
SURGIFLO W/THROMBIN 8M KIT (HEMOSTASIS) ×3 IMPLANT
SUT BONE WAX W31G (SUTURE) ×3 IMPLANT
SUT VIC AB 2-0 CTB1 (SUTURE) IMPLANT
SUT VIC AB 3-0 PS2 18 (SUTURE) ×2
SUT VIC AB 3-0 PS2 18XBRD (SUTURE) ×1 IMPLANT
SUT VIC AB 3-0 X1 27 (SUTURE) IMPLANT
SUT VIC AB 4-0 PS2 27 (SUTURE) ×3 IMPLANT
SYR BULB 3OZ (MISCELLANEOUS) IMPLANT
TOWEL OR 17X24 6PK STRL BLUE (TOWEL DISPOSABLE) ×3 IMPLANT
TOWEL OR 17X26 10 PK STRL BLUE (TOWEL DISPOSABLE) ×3 IMPLANT
WATER STERILE IRR 1000ML POUR (IV SOLUTION) IMPLANT

## 2015-12-20 NOTE — Transfer of Care (Signed)
Immediate Anesthesia Transfer of Care Note  Patient: Lucas Morales  Procedure(s) Performed: Procedure(s): C5-6 Anterior Cervical Discectomy and Fusion, Allograft, and Plate (N/A)  Patient Location: PACU  Anesthesia Type:General  Level of Consciousness: awake, patient cooperative and lethargic  Airway & Oxygen Therapy: Patient Spontanous Breathing and Patient connected to nasal cannula oxygen  Post-op Assessment: Report given to RN and Post -op Vital signs reviewed and stable  Post vital signs: Reviewed and stable  Last Vitals:  Vitals:   12/20/15 1056  BP: 139/74  Pulse: 69  Resp: 20  Temp: 36.9 C    Last Pain:  Vitals:   12/20/15 1056  TempSrc: Oral         Complications: No apparent anesthesia complications

## 2015-12-20 NOTE — Progress Notes (Signed)
Orthopedic Tech Progress Note Patient Details:  Lucas Morales Lucas Morales Lucas Morales Dropped of soft collar for shower. Patient ID: Lucas Morales, male   DOB: Mar 30, 1933, 82 y.o.   MRN: [id number]   Lucas Morales 12/20/2015, 8:33 PM

## 2015-12-20 NOTE — Anesthesia Procedure Notes (Signed)
Procedure Name: Intubation Date/Time: 12/20/2015 12:43 PM Performed by: Mervyn Gay Pre-anesthesia Checklist: Patient identified, Patient being monitored, Timeout performed, Emergency Drugs available and Suction available Patient Re-evaluated:Patient Re-evaluated prior to inductionOxygen Delivery Method: Circle System Utilized Preoxygenation: Pre-oxygenation with 100% oxygen Intubation Type: IV induction Ventilation: Mask ventilation without difficulty Laryngoscope Size: Glidescope and 4 Grade View: Grade I Tube type: Oral Tube size: 7.5 mm Number of attempts: 1 Airway Equipment and Method: Stylet Placement Confirmation: ETT inserted through vocal cords under direct vision,  positive ETCO2 and breath sounds checked- equal and bilateral Secured at: 23 cm Tube secured with: Tape Dental Injury: Teeth and Oropharynx as per pre-operative assessment  Difficulty Due To: Difficult Airway- due to large tongue and Difficult Airway- due to reduced neck mobility

## 2015-12-20 NOTE — Anesthesia Postprocedure Evaluation (Signed)
Anesthesia Post Note  Patient: Lucas Morales  Procedure(s) Performed: Procedure(s) (LRB): C5-6 Anterior Cervical Discectomy and Fusion, Allograft, and Plate (N/A)  Patient location during evaluation: PACU Anesthesia Type: General Level of consciousness: awake and alert Pain management: pain level controlled Vital Signs Assessment: post-procedure vital signs reviewed and stable Respiratory status: spontaneous breathing, nonlabored ventilation, respiratory function stable and patient connected to nasal cannula oxygen Cardiovascular status: blood pressure returned to baseline and stable Postop Assessment: no signs of nausea or vomiting Anesthetic complications: no    Last Vitals:  Vitals:   12/20/15 1500 12/20/15 1505  BP:  (!) 148/73  Pulse: 71 69  Resp: 16 17  Temp:      Last Pain:  Vitals:   12/20/15 1056  TempSrc: Oral                 Tiajuana Amass

## 2015-12-20 NOTE — Anesthesia Preprocedure Evaluation (Signed)
Anesthesia Evaluation  Patient identified by MRN, date of birth, ID band Patient awake    Reviewed: Allergy & Precautions, NPO status , Patient's Chart, lab work & pertinent test results, reviewed documented beta blocker date and time   History of Anesthesia Complications Negative for: history of anesthetic complications  Airway Mallampati: IV  TM Distance: >3 FB Neck ROM: Full    Dental  (+) Dental Advisory Given   Pulmonary shortness of breath, sleep apnea ,    breath sounds clear to auscultation       Cardiovascular hypertension, Pt. on medications and Pt. on home beta blockers + CAD and + CABG   Rhythm:Regular Rate:Normal  04/01/13: Intermediate risk stress nuclear study with a small area of anteroseptal reversibility. There is a fixed inferoapical defect. SDS 4. Extent 6%. LV Wall Motion: Anteroseptal hypokinesis. EF 51%. '15 ECHO: EF 55-60%, valves OK    Neuro/Psych Anxiety Depression Neck pain    GI/Hepatic Neg liver ROS, GERD  Medicated and Controlled,  Endo/Other  diabetes (no meds)Morbid obesity  Renal/GU negative Renal ROS     Musculoskeletal  (+) Arthritis , Osteoarthritis,    Abdominal (+) + obese,   Peds  Hematology negative hematology ROS (+)   Anesthesia Other Findings   Reproductive/Obstetrics                             Anesthesia Physical Anesthesia Plan  ASA: III  Anesthesia Plan: General   Post-op Pain Management:    Induction: Intravenous  Airway Management Planned: Oral ETT and Video Laryngoscope Planned  Additional Equipment:   Intra-op Plan:   Post-operative Plan: Extubation in OR  Informed Consent: I have reviewed the patients History and Physical, chart, labs and discussed the procedure including the risks, benefits and alternatives for the proposed anesthesia with the patient or authorized representative who has indicated his/her understanding and  acceptance.   Dental advisory given  Plan Discussed with: CRNA and Surgeon  Anesthesia Plan Comments: (Plan routine monitors, GETA with VideoGlide intubation   Jenita Seashore, MD)        Anesthesia Quick Evaluation

## 2015-12-20 NOTE — H&P (Signed)
Lucas Morales is an 82 y.o. male.   An 82 year old male returns, he is still holding his head in his hand, states he is having significant neck pain, left arm pain, basically going on for a year.  He has been coming in for a couple of months requesting surgery.  MRI showed some evidence of narrowing consistent with his symptoms and due to ongoing problems, failure to respond to epidural steroid injections, plus therapy, Lyrica, traction, anti-inflammatories, prednisone dose pack, we obtained EMGs, nerve conduction velocities.  This shows chronic C6 radiculopathy consistent with his findings.  He also has moderate left median nerve entrapment involving both sensory and motor findings.  Patient notes a history of gout, he still takes allopurinol, Losartan, Cialis occasionally, ranitidine, multi-vitamins, carvedilol, iron, Flovent, atorvastatin, oral steroid inhaler.  Patient has reaction to MORPHINE.  Previous surgeries include back surgery which did well, partial colon resection, bilateral knee arthroplasties.  Hip fracture, quadruple bypass surgery.  Family history positive for heart disease, lung disease, diabetes.  Patient is married and retired, has been to the point where he wants to do a lot of traveling and has a trip coming up for [place].  Patient states the pain bothers him during the day, it is difficult for him to go to sleep at night.  Radiates into his left neck, left arm, down past his elbow into the forearm.     ADDENDUM:  Patient will need to get cardiology clearance through Dr. Quay Burow.    Past Medical History:  Diagnosis Date  . Adenomatous colon polyp   . Alpha-1-antitrypsin deficiency ([place])   . Alpha-1-antitrypsin deficiency carrier ([place])   . Anemia   . Anxiety   . Arthritis    "knees; bad in my back" (09/01/2014)  . Bruises easily   . Chronic bronchitis ([place])    "get it pretty much q yr" (09/01/2014)  . Chronic lower back pain   . Coronary artery disease   .  Depression    "related to health" (09/01/2014)  . Diarrhea    has had a part of colon removed  . Diverticulitis   . Diverticulosis   . Dyspnea on exertion   . Dysrhythmia    bradycardia in the 30's (01/11/2012).. Afib briefly after CABG  . Esophageal dysmotility   . Esophageal stricture   . Gastritis   . GERD (gastroesophageal reflux disease)    takes Pantoprazole daily  . Gout   . Hemorrhoids   . Hiatal hernia   . History of colon polyps   . Hyperlipidemia    doesn't require meds  . Hypertension    takes Losartan daily; current not taking   . IC (interstitial cystitis)   . Insomnia    takes Nortrypyline nightly  . Joint pain   . Joint swelling   . Macular hole of both eyes   . Nocturia   . OSA (obstructive sleep apnea)    "mild; tried CPAP; couldn't tolerate" (09/01/2014)  . Paroxysmal atrial fibrillation (H[facility])   . Restless leg syndrome   . Sciatic nerve pain   . Type II diabetes mellitus ([place])    "borderline"  . Urinary frequency     Past Surgical History:  Procedure Laterality Date  . [place] VITRECTOMY WITH 20 GAUGE MVR PORT FOR MACULAR HOLE Right 10/08/2012   Procedure: 25 GAUGE PARS PLANA VITRECTOMY WITH 20 GAUGE MVR PORT FOR MACULAR HOLE; Membrame peel, serum patch; Laser treatment ; Gas exchange;  Surgeon: Chrystie Nose  Zigmund Daniel, MD;  Location: [facility];  Service: Ophthalmology;  Laterality: Right;  . 25 GAUGE PARS PLANA VITRECTOMY WITH 20 GAUGE MVR PORT FOR MACULAR HOLE Left 09/01/2014   Procedure: 25 GAUGE PARS PLANA VITRECTOMY WITH 20 GAUGE MVR PORT FOR MACULAR HOLE;  Surgeon: Hayden Pedro, MD;  Location: [facility];  Service: Ophthalmology;  Laterality: Left;  . APPENDECTOMY  2009  . bladder lesion removed    . CARDIAC CATHETERIZATION  01/11/2012  . CATARACT EXTRACTION W/ INTRAOCULAR LENS  IMPLANT, BILATERAL Bilateral 2011  . CHOLECYSTECTOMY  2009  . COLONOSCOPY    . COMPRESSION HIP SCREW Left 04/04/2014   Procedure: COMPRESSION HIP LEFT;  Surgeon:  Mcarthur Rossetti, MD;  Location: [facility];  Service: Orthopedics;  Laterality: Left;  . CORONARY ANGIOPLASTY    . CORONARY ARTERY BYPASS GRAFT  01/18/2012   Procedure: CORONARY ARTERY BYPASS GRAFTING (CABG);  Surgeon: Ivin Poot, MD;  Location: [facility];  Service: Open Heart Surgery;  Laterality: N/A;  times four, using left internal mammary artery  . CYSTOSCOPY WITH URETEROSCOPY  2008; 2009   "painted inside of bladder wall"  . ELBOW SURGERY Right 1988   "reconstructive OR"  . ENDOVEIN HARVEST OF GREATER SAPHENOUS VEIN  01/18/2012   Procedure: ENDOVEIN HARVEST OF GREATER SAPHENOUS VEIN;  Surgeon: Ivin Poot, MD;  Location: [facility];  Service: Open Heart Surgery;  Laterality: Right;  . ESOPHAGOGASTRODUODENOSCOPY    . ESOPHAGOGASTRODUODENOSCOPY N/A 06/05/2014   Procedure: ESOPHAGOGASTRODUODENOSCOPY (EGD);  Surgeon: Irene Shipper, MD;  Location: [facility];  Service: Endoscopy;  Laterality: N/A;  . EXCISION/RELEASE BURSA HIP Left 07/30/2015   Procedure: EXCISION/RELEASE BURSA HIP;  Surgeon: Mcarthur Rossetti, MD;  Location: [facility];  Service: Orthopedics;  Laterality: Left;  . EXPLORATORY LAPAROTOMY  2009   with right colectomy  . FRACTURE SURGERY    . GAS INSERTION Left 09/01/2014   Procedure: INSERTION OF GAS;  Surgeon: Hayden Pedro, MD;  Location: [facility];  Service: Ophthalmology;  Laterality: Left;  C3F8  . GAS/FLUID EXCHANGE Left 09/01/2014   Procedure: GAS/FLUID EXCHANGE;  Surgeon: Hayden Pedro, MD;  Location: [facility];  Service: Ophthalmology;  Laterality: Left;  . HARDWARE REMOVAL Left 07/30/2015   Procedure: attempted LEFT HIP HARDWARE REMOVAL, LEFT HIP BURSECTOMY;  Surgeon: Mcarthur Rossetti, MD;  Location: [facility];  Service: Orthopedics;  Laterality: Left;  . HEEL SPUR EXCISION Bilateral 1970's  . INTERTROCHANTERIC HIP FRACTURE SURGERY Left 04/04/2014  . JOINT REPLACEMENT    . LEFT HEART CATHETERIZATION WITH CORONARY ANGIOGRAM N/A 01/11/2012   Procedure: LEFT HEART  CATHETERIZATION WITH CORONARY ANGIOGRAM;  Surgeon: Leonie Man, MD;  Location: [facility];  Service: Cardiovascular;  Laterality: N/A;  . LUMBAR [place] SURGERY  ?2010   "cleaned out the stenosis" (01/11/2012)  . MEMBRANE PEEL Left 09/01/2014   Procedure: MEMBRANE PEEL;  Surgeon: Hayden Pedro, MD;  Location: [facility];  Service: Ophthalmology;  Laterality: Left;  . NM MYOCAR MULTIPLE W/SPECT  04/09/2012   NORMAL STRESS NUCLEAR STUDY. EF 51%.  [name] PARS PLANA VITRECTOMY W/ REPAIR OF MACULAR HOLE Left 09/01/2014  . PARTIAL COLECTOMY     d/t diverticulosis  . PHOTOCOAGULATION WITH LASER Left 09/01/2014   Procedure: PHOTOCOAGULATION WITH LASER;  Surgeon: Hayden Pedro, MD;  Location: [facility];  Service: Ophthalmology;  Laterality: Left;  HEADSCOPE LASER  . RECONSTRUCTION MEDIAL COLLATERAL LIGAMENT ELBOW W/ TENDON GRAFT Right 1988 X 2  . SERUM PATCH Left 09/01/2014   Procedure:  SERUM PATCH;  Surgeon: Hayden Pedro, MD;  Location: [facility];  Service: Ophthalmology;  Laterality: Left;  . TONSILLECTOMY AND ADENOIDECTOMY  ~1943  . TOTAL KNEE ARTHROPLASTY Bilateral ~ 2001; 2011   right; left  . TRANSTHORACIC ECHOCARDIOGRAM  01/04/2012   MODERATE SEVERE-SEVERE LVH. EF=>55%. MILDLY IMPAIRED LV RELAXATION. LA IS MODERATE TO SEVERE DILATED. MODERATE MR. MV LEAFLETS APPEAR THICHENED.  [name] TUMOR EXCISION Left 12/2011   arm    Family History  Problem Relation Age of Onset  . Emphysema Mother   . Kidney disease Father   . Emphysema Maternal Grandfather   . Colon cancer Neg Hx    Social History:  reports that he has never smoked. He has never used smokeless tobacco. He reports that he does not drink alcohol or use drugs.  Allergies:  Allergies  Allergen Reactions  . Morphine Itching  . Shellfish-Derived Products Rash    No prescriptions prior to admission.    No results found for this or any previous visit (from the past 48 hour(s)). No results found.  Review of Systems  Constitutional: Negative.    HENT: Negative.   Eyes: Negative.   Respiratory: Negative.   Cardiovascular: Negative.   Gastrointestinal: Negative.   Genitourinary: Negative.   Musculoskeletal: Positive for neck pain.  Skin: Negative.   Psychiatric/Behavioral: Negative.     There were no vitals taken for this visit. Physical Exam  Constitutional: He is oriented to person, place, and time. No distress.  HENT:  Head: Normocephalic and atraumatic.  Eyes: EOM are normal. Pupils are equal, round, and reactive to light.  Neck: Normal range of motion.  Cardiovascular: Normal rate.   Respiratory: No respiratory distress.  GI: He exhibits no distension.  Musculoskeletal: He exhibits tenderness.  Neurological: He is alert and oriented to person, place, and time.  Skin: Skin is warm and dry.  Psychiatric: He has a normal mood and affect.   PHYSICAL EXAMINATION:  Patient is alert and oriented, good visual acuity with glasses.  Significant brachial plexus tenderness on the left, none on the right.  Upper extremity reflexes are 2+.  He has some discomfort with carpal compression.  Lungs are clear.  Heart regular rate and rhythm, no abdominal tenderness.   ASSESSMENT:  C5-6 spondylosis with some evidence of chronic C6 radiculopathy.  By MRI scan there is a ______ deformity at C3-4 and more prominent narrowing of the foramina both right and left.  Patient states he would like to proceed with this before he goes on vacation since he has difficulty with turning, twisting, bumping, taking rides and traveling.  He has loss of the anterior epidural space with broad-based disk protrusion and foraminal narrowing right and left at the C5-6 level.    PLAN:  Questions are listed and answered.  He understands that he has some spondylosis at other levels and may still have some symptoms persist after the surgical intervention.  All questions answered, he understands and requests we proceed.  Lanae Crumbly, PA-C 12/20/2015, 7:47 AM

## 2015-12-20 NOTE — Progress Notes (Signed)
Pt arrived to floor A&O with no complains, clear liquid dinner order, resting comforability

## 2015-12-20 NOTE — Interval H&P Note (Signed)
History and Physical Interval Note:  12/20/2015 99991111 PM  Lucas Morales  has presented today for surgery, with the diagnosis of C5-6 Spondylosis  The various methods of treatment have been discussed with the patient and family. After consideration of risks, benefits and other options for treatment, the patient has consented to  Procedure(s): C5-6 Anterior Cervical Discectomy and Fusion, Allograft, and Plate (N/A) as a surgical intervention .  The patient's history has been reviewed, patient examined, no change in status, stable for surgery.  I have reviewed the patient's chart and labs.  Questions were answered to the patient's satisfaction.     [name]

## 2015-12-21 ENCOUNTER — Encounter (HOSPITAL_COMMUNITY): Payer: Self-pay | Admitting: Orthopaedic Surgery

## 2015-12-21 MED ORDER — HYDROCODONE-ACETAMINOPHEN 5-325 MG PO TABS: 1.0000 | ORAL_TABLET | Freq: Four times a day (QID) | ORAL | 0 refills | Status: DC | PRN

## 2015-12-21 NOTE — Progress Notes (Signed)
Subjective: 1 Day Post-Op Procedure(s) (LRB): C5-6 Anterior Cervical Discectomy and Fusion, Allograft, and Plate (N/A) Patient reports pain as mild.  Arm pain gone.  Objective: Vital signs in last 24 hours: Temp:  [97.5 F (36.4 C)-98.5 F (36.9 C)] 98 F (36.7 C) (10/03 0616) Pulse Rate:  [67-87] 87 (10/03 0616) Resp:  [10-21] 15 (10/03 0616) BP: (135-152)/(47-86) 135/47 (10/03 0616) SpO2:  [93 %-98 %] 98 % (10/03 0616) Weight:  [111.6 kg (246 lb)] 111.6 kg (246 lb) (10/02 1056)  Intake/Output from previous day: 10/02 0701 - 10/03 0700 In: 1050 [I.V.:1000; IV Piggyback:50] Out: 50 [Blood:50] Intake/Output this shift: No intake/output data recorded.  No results for input(s): HGB in the last 72 hours. No results for input(s): WBC, RBC, HCT, PLT in the last 72 hours. No results for input(s): NA, K, CL, CO2, BUN, CREATININE, GLUCOSE, CALCIUM in the last 72 hours. No results for input(s): LABPT, INR in the last 72 hours.  Neurologically intact  Assessment/Plan: 1 Day Post-Op Procedure(s) (LRB): C5-6 Anterior Cervical Discectomy and Fusion, Allograft, and Plate (N/A) Plan:  Home today. Drain pulled.   Lucas Morales 12/21/2015, 7:34 AM

## 2015-12-21 NOTE — Op Note (Signed)
NAME:  OAKS, CRONISTER NO.:  [id number]  MEDICAL RECORD NO.:  [id number]  LOCATION:  [id number]                        FACILITY:  [facility]  PHYSICIAN:  Seleen Walter C. Ophelia Charter, M.D.    DATE OF BIRTH:  05-06-1933  DATE OF PROCEDURE:  12/20/2015 DATE OF DISCHARGE:                              OPERATIVE REPORT   PREOPERATIVE DIAGNOSIS:  C5-6 cervical spondylosis.  POSTOPERATIVE DIAGNOSIS:  C5-6 cervical spondylosis.  PROCEDURE:  C5-6 anterior cervical diskectomy, fusion, allograft, and plate.  SURGEON:  Verania Salberg C. Ophelia Charter, M.D.  ASSISTANT:  Genene Churn. Barry Dienes, PA-C, medically necessary and present for the entire procedure.  ESTIMATED BLOOD LOSS:  Less than 100 mL.  IMPLANTS:  DePuy Skyline 14 mm plate, 14 mm screws x4, VG2 7 mm graft corticocancellous.  DESCRIPTION OF PROCEDURE:  After standard prepping and draping, informed consent, Ancef prophylaxis with head-halter traction without weight, arms tucked to the side with pads over the ulnar nerve, wrist restraints for traction if needed, which was not used during the case.  Area was squared with towels.  After DuraPrep, sterile skin marker started midline extending to the left in line with the skin fold based on palpable landmarks for the C5-6 level.  Betadine, Steri-Drape, sterile Mayo stand at the head, thyroid sheets, and drapes.  Incision was made starting at the midline extending to the left platysma, was divided in line with the fibers.  Blunt dissection down between the carotid sheath and contents lateral and trachea and esophagus medial.  We went underneath the omohyoid muscle and longus colli muscle was visualized. They were elevated.  Bovie electrocautery was used at the midline and a prominent spur at C5-6 was isolated.  Short 25 needle was placed, C-arm was draped, brought in confirming this was the appropriate planned operative level.  Teeth retractors, Cloward plates were placed right and left.  Smooth blades cephalad  and caudad.  Diskectomy was performed. Operative microscope was brought in to take down the posterior aspect of the disk.  Bur was used, curettes, microdissection techniques to remove the posterior spurs, 1 and 2 mm Kerrisons.  There was a combination of disk osteophyte complex causing compression.  Once decompression was performed, all spurs were removed.  Dura was visualized.  Some Surgiflo was placed for a few minutes.  Operative field was dry.  A 7 mm graft gave tight fit, 6 was a little loose, 8 was not tried since it would have been too tight.  Anterior spurs were removed from C5-6, 14 mm plate was selected and held with a single prong checked under C-arm and then holes were drilled.  Screws were placed.  Operative site was clean. There was room for fluid to egress around either side of the bone graft. Hemovac was placed with in-and-out technique in line with the skin incision on the left side.  Platysma reapproximated with 3-0 Vicryl, 4-0 Vicryl, subcuticular closure.  Tincture of benzoin, Steri-Strips, 4x4s, tape, and soft cervical collar.  The patient tolerated the procedure well, transferred to the recovery room in a stable condition.     Huston Stonehocker C. Ophelia Charter, M.D.     MCY/MEDQ  D:  12/20/2015  T:  12/21/2015  Job:  [id number]

## 2015-12-21 NOTE — Progress Notes (Signed)
Pt ready to be discharged to home. All discharge instructions and rx reviewed with pt. Pt demonstrates no s/sx of distress or c/o. Soft collar in use.  All personal belongings with pt. No difficulty swallowing noted.

## 2015-12-21 NOTE — Care Management Obs Status (Signed)
[facility] NOTIFICATION   Patient Details  Name: Lucas Morales MRN: [id number] Date of Birth: September 11, 1933   Medicare Observation Status Notification Given:  Yes    Ninfa Meeker, RN 12/21/2015, 11:18 AM

## 2015-12-28 ENCOUNTER — Inpatient Hospital Stay (INDEPENDENT_AMBULATORY_CARE_PROVIDER_SITE_OTHER): Payer: Medicare Other | Admitting: Orthopaedic Surgery

## 2015-12-28 DIAGNOSIS — M4802 Spinal stenosis, cervical region: Secondary | ICD-10-CM | POA: Diagnosis not present

## 2016-01-06 NOTE — Discharge Summary (Signed)
Patient ID: Lucas Morales MRN: [id number] DOB/AGE: 06/19/1933 82 y.o.  Admit date: 12/20/2015 Discharge date: 01/06/2016  Admission Diagnoses:  Active Problems:   Cervical spinal stenosis   Cervical spondylosis   Discharge Diagnoses:  Active Problems:   Cervical spinal stenosis   Cervical spondylosis  status post Procedure(s): C5-6 Anterior Cervical Discectomy and Fusion, Allograft, and Plate  Past Medical History:  Diagnosis Date  . Adenomatous colon polyp   . Alpha-1-antitrypsin deficiency ([place])   . Alpha-1-antitrypsin deficiency carrier ([place])   . Anemia   . Anxiety   . Arthritis    "knees; bad in my back" (09/01/2014)  . Bruises easily   . Chronic bronchitis ([place])    "get it pretty much q yr" (09/01/2014)  . Chronic lower back pain   . Coronary artery disease   . Depression    "related to health" (09/01/2014)  . Diarrhea    has had a part of colon removed  . Diverticulitis   . Diverticulosis   . Dyspnea on exertion   . Dysrhythmia    bradycardia in the 30's (01/11/2012).. Afib briefly after CABG  . Esophageal dysmotility   . Esophageal stricture   . Gastritis   . GERD (gastroesophageal reflux disease)    takes Pantoprazole daily  . Gout   . Hemorrhoids   . Hiatal hernia   . History of colon polyps   . Hyperlipidemia    doesn't require meds  . Hypertension    takes Losartan daily; current not taking   . IC (interstitial cystitis)   . Insomnia    takes Nortrypyline nightly  . Joint pain   . Joint swelling   . Macular hole of both eyes   . Nocturia   . OSA (obstructive sleep apnea)    "mild; tried CPAP; couldn't tolerate" (09/01/2014)  . Paroxysmal atrial fibrillation (HCC)   . Restless leg syndrome   . Sciatic nerve pain   . Type II diabetes mellitus ([place])    "borderline"  . Urinary frequency     Surgeries: Procedure(s): C5-6 Anterior Cervical Discectomy and Fusion, Allograft, and Plate on 075-GRM   Consultants:   Discharged  Condition: Improved  Hospital Course: Lucas Morales is an 82 y.o. male who was admitted 12/20/2015 for operative treatment of cervical stenosis. Patient failed conservative treatments (please see the history and physical for the specifics) and had severe unremitting pain that affects sleep, daily activities and work/hobbies. After pre-op clearance, the patient was taken to the operating room on 12/20/2015 and underwent  Procedure(s): C5-6 Anterior Cervical Discectomy and Fusion, Allograft, and Plate.    Patient was given perioperative antibiotics:  Anti-infectives    Start     Dose/Rate Route Frequency Ordered Stop   12/20/15 1830  ceFAZolin (ANCEF) IVPB 1 g/50 mL premix     1 g 100 mL/hr over 30 Minutes Intravenous Every 8 hours 12/20/15 1725 12/21/15 0700   12/20/15 1215  ceFAZolin (ANCEF) IVPB 2g/100 mL premix     2 g 200 mL/hr over 30 Minutes Intravenous On call to O.R. 12/20/15 1043 12/20/15 1258       Patient was given sequential compression devices and early ambulation to prevent DVT.   Patient benefited maximally from hospital stay and there were no complications. At the time of discharge, the patient was urinating/moving their bowels without difficulty, tolerating a regular diet, pain is controlled with oral pain medications and they have been cleared by PT/OT.   Recent vital signs: No  data found.    Recent laboratory studies: No results for input(s): WBC, HGB, HCT, PLT, NA, K, CL, CO2, BUN, CREATININE, GLUCOSE, INR, CALCIUM in the last 72 hours.  Invalid input(s): PT, 2   Discharge Medications:     Medication List    STOP taking these medications   methocarbamol 500 MG tablet Commonly known as:  ROBAXIN   oxyCODONE 10 mg 12 hr tablet Commonly known as:  OXYCONTIN   oxyCODONE-acetaminophen 5-325 MG tablet Commonly known as:  PERCOCET/ROXICET     TAKE these medications   allopurinol 300 MG tablet Commonly known as:  ZYLOPRIM Take 300 mg by mouth daily.    Apple Cider Vinegar Tabs Take 1 tablet by mouth 2 (two) times daily.   aspirin EC 81 MG tablet Take 81 mg by mouth daily.   atorvastatin 40 MG tablet Commonly known as:  LIPITOR TAKE 1 TABLET(40 MG) BY MOUTH DAILY 6 PM   calcium carbonate 750 MG chewable tablet Commonly known as:  TUMS EX Chew 6-7 tablets by mouth daily.   carvedilol 12.5 MG tablet Commonly known as:  COREG Take 6.25 mg by mouth 2 (two) times daily.   FIBER SELECT GUMMIES PO Take 3-4 tablets by mouth daily.   HYDROcodone-acetaminophen 5-325 MG tablet Commonly known as:  NORCO Take 1-2 tablets by mouth every 6 (six) hours as needed for moderate pain.   KOMBIGLYZE XR 2.07-998 MG Tb24 Generic drug:  Saxagliptin-Metformin Take 1 tablet by mouth daily.   loperamide 2 MG capsule Commonly known as:  IMODIUM Take 6 mg by mouth daily.   multivitamin with minerals tablet Take 1 tablet by mouth daily.   naproxen 500 MG tablet Commonly known as:  NAPROSYN Take 500 mg by mouth 3 (three) times daily.   OVER THE COUNTER MEDICATION Apply 1 application topically daily as needed (For fungal infection.). Ciclopirox 8% Topical Solution   pantoprazole 40 MG tablet Commonly known as:  PROTONIX Take 1 tablet (40 mg total) by mouth 2 (two) times daily. What changed:  when to take this   ranitidine 150 MG tablet Commonly known as:  ZANTAC Take 150 mg by mouth daily.   tadalafil 5 MG tablet Commonly known as:  CIALIS Take 5 mg by mouth daily. For interstitial cystitis.       Diagnostic Studies: Dg Chest 2 View  Result Date: 12/16/2015 CLINICAL DATA:  Preoperative examination. EXAM: CHEST  2 VIEW COMPARISON:  Chest radiograph 02/14/2012 FINDINGS: Postsurgical changes from CABG. Cardiomediastinal silhouette is normal. Mediastinal contours appear intact. Probable small hiatal hernia. There is no evidence of focal airspace consolidation, pleural effusion or pneumothorax. Stable high attenuation 8 mm right upper  thorax nodule, favor granuloma. Osseous structures are without acute abnormality. Soft tissues are grossly normal. IMPRESSION: No active cardiopulmonary disease. Electronically Signed   By: Fidela Salisbury M.D.   On: 12/16/2015 15:44   Dg Cervical Spine 2-3 Views  Result Date: 12/20/2015 CLINICAL DATA:  Anterior cervical disc fusion at C5-6 EXAM: DG C-ARM 61-120 MIN; CERVICAL SPINE - 2-3 VIEW COMPARISON:  Cervical spine radiographs from 12/16/2015 FINDINGS: Three spot fluoroscopic views during anterior cervical disc fusion across the C5-6 interspace have been provided. Numbering based on prior cervical spine radiographs from 12/16/2015. These images demonstrate anterior plate and screw fixation across the C5-6 interspace with interbody block noted at C5-6. Fine bony detail is limited by the fluoroscopic technique. No hardware failure nor definite postoperative fracture. IMPRESSION: C5-6 ACDF in place Electronically Signed   By: Shanon Brow  Randel Pigg M.D.   On: 12/20/2015 18:02   Dg Cervical Spine 2 Or 3 Views  Result Date: 12/16/2015 CLINICAL DATA:  Pre-op chest exam for ACDF 5-6 on 10/2 No current chest complaints, extensive cardiac history, CABG 2013, DM2, dyspnea w/ exertion, sleep apnea EXAM: CERVICAL SPINE - 2-3 VIEW COMPARISON:  MRI 07/15/2015 FINDINGS: Significant degenerative changes are identified throughout the cervical spine with disc height loss particularly notable at C3-4, C4-5, C5-6. Exaggerated lordosis noted. Status post median sternotomy and CABG. IMPRESSION: Degenerative changes throughout the cervical spine. Electronically Signed   By: Nolon Nations M.D.   On: 12/16/2015 15:18   Dg C-arm 1-60 Min  Result Date: 12/20/2015 CLINICAL DATA:  Anterior cervical disc fusion at C5-6 EXAM: DG C-ARM 61-120 MIN; CERVICAL SPINE - 2-3 VIEW COMPARISON:  Cervical spine radiographs from 12/16/2015 FINDINGS: Three spot fluoroscopic views during anterior cervical disc fusion across the C5-6 interspace have  been provided. Numbering based on prior cervical spine radiographs from 12/16/2015. These images demonstrate anterior plate and screw fixation across the C5-6 interspace with interbody block noted at C5-6. Fine bony detail is limited by the fluoroscopic technique. No hardware failure nor definite postoperative fracture. IMPRESSION: C5-6 ACDF in place Electronically Signed   By: Ashley Royalty M.D.   On: 12/20/2015 18:02      Follow-up Information    [name], MD. Schedule an appointment as soon as possible for a visit today.   Specialty:  Orthopedic Surgery Why:  need to schedule return office visit one week.  Contact information: [place] [address] [phone]        [name], MD .   Specialty:  Orthopedic Surgery Contact information: [place] [address] [phone]           Discharge Plan:  discharge to home  Disposition:     Signed: Lanae Crumbly for Rodell Perna MD 01/06/2016, 10:40 AM

## 2016-01-25 ENCOUNTER — Ambulatory Visit (INDEPENDENT_AMBULATORY_CARE_PROVIDER_SITE_OTHER): Payer: Medicare Other

## 2016-01-25 ENCOUNTER — Encounter (INDEPENDENT_AMBULATORY_CARE_PROVIDER_SITE_OTHER): Payer: Self-pay | Admitting: Orthopaedic Surgery

## 2016-01-25 ENCOUNTER — Ambulatory Visit (INDEPENDENT_AMBULATORY_CARE_PROVIDER_SITE_OTHER): Payer: Medicare Other | Admitting: Orthopaedic Surgery

## 2016-01-25 VITALS — BP 143/75 | HR 75

## 2016-01-25 DIAGNOSIS — Z981 Arthrodesis status: Secondary | ICD-10-CM

## 2016-01-25 MED ORDER — OXYCODONE-ACETAMINOPHEN 5-325 MG PO TABS: 1.0000 | ORAL_TABLET | ORAL | 0 refills | Status: DC | PRN

## 2016-01-25 NOTE — Progress Notes (Signed)
Post-Op Visit Note   Patient: Lucas Morales           Date of Birth: [birth year]           MRN: [id number] Visit Date: 01/25/2016 PCP: Horatio Pel, MD  82 yo male, patient is here s/p C5-6 ACDF on 12/20/15.  He is sore, has pain if he moves to the left.  Continues to wear the soft collar.  Occasioally has left shoulder pain.  Norco did not help his pain, he has tried also to take naproxen and celebrex without relief.  He would like some oxycodone for pain.   Assessment & Plan:  Chief Complaint:  Chief Complaint  Patient presents with  . Neck - Routine Post Op   Visit Diagnoses:  1. S/P cervical spinal fusion     Plan: Return office visit in 3 months and we will shoot lateral C-spine flexion-extension x-ray two-view only  Follow-Up Instructions: Return in about 3 months (around 04/26/2016).   Orders:  Orders Placed This Encounter  Procedures  . XR Cervical Spine 2 or 3 views   Meds ordered this encounter  Medications  . oxyCODONE-acetaminophen (PERCOCET/ROXICET) 5-325 MG tablet    Sig: Take 1-2 tablets by mouth every 4 (four) hours as needed for severe pain.    Dispense:  25 tablet    Refill:  0     PMFS History: Patient Active Problem List   Diagnosis Date Noted  . S/P cervical spinal fusion 01/25/2016  . Cervical spinal stenosis 12/20/2015  . Cervical spondylosis 12/20/2015  . Painful orthopaedic hardware left hip with chronic trochanteric bursitis 07/30/2015  . Hip bursitis 07/30/2015  . Trochanteric bursitis of left hip 07/30/2015  . Atypical chest pain 10/19/2014  . Macular hole, left eye 09/01/2014  . Macular hole of left eye 08/28/2014  . Other pancytopenia ([place])   . Sliding hiatal hernia   . Gastric erosions   . GI bleeding 06/04/2014  . Acute GI bleeding 06/04/2014  . Orthostatic hypotension 06/04/2014  . Acute upper gastrointestinal bleeding 06/04/2014  . Other specified diabetes mellitus without complications   . Acute blood loss anemia    . Thrombocytopenia ([place])   . Closed left hip fracture ([place]) 04/03/2014  . Fracture, femur ([place]) 04/03/2014  . Femur fracture ([place]) 04/03/2014  . Upper airway cough syndrome 05/16/2013  . OSA (obstructive sleep apnea) 10/21/2012  . Macular hole 10/08/2012  . Preoperative clearance 09/30/2012  . Atrial fibrillation with rapid ventricular response; New onset, post-op CABG 01/21/2012  . S/P CABG x 4, elective, 01/18/12 01/19/2012  . Abnormal nuclear cardiac imaging test -- Inferolateral ischemia; Coronary CTA in 2010, mild-moderate 3 V plaque; Echo - EF >55%, moderate to severe LVH 01/11/2012  . Chest pain on exertion 01/11/2012  . Shortness of breath on exertion 01/11/2012  . Bradycardia, sinus 01/11/2012  . CAD -- mLAD 80&-60%, OM2 90%, OM3 70-80%, RPDA 90%, cath 01/11/12 01/11/2012  . Hypertension   . Hyperlipidemia   . ABDOMINAL PAIN-LLQ 02/04/2010  . HEMORRHOIDS 08/31/2008  . ESOPHAGEAL STRICTURE 08/31/2008  . GERD 08/31/2008  . Gastritis and gastroduodenitis 08/31/2008  . HIATAL HERNIA 08/31/2008  . DIVERTICULOSIS, COLON 08/31/2008  . INTERSTITIAL CYSTITIS 08/31/2008  . COLONIC POLYPS, ADENOMATOUS, HX OF 08/31/2008   Past Medical History:  Diagnosis Date  . Adenomatous colon polyp   . Alpha-1-antitrypsin deficiency ([place])   . Alpha-1-antitrypsin deficiency carrier ([place])   . Anemia   . Anxiety   . Arthritis    "knees;  bad in my back" (09/01/2014)  . Bruises easily   . Chronic bronchitis ([place])    "get it pretty much q yr" (09/01/2014)  . Chronic lower back pain   . Coronary artery disease   . Depression    "related to health" (09/01/2014)  . Diarrhea    has had a part of colon removed  . Diverticulitis   . Diverticulosis   . Dyspnea on exertion   . Dysrhythmia    bradycardia in the 30's (01/11/2012).. Afib briefly after CABG  . Esophageal dysmotility   . Esophageal stricture   . Gastritis   . GERD (gastroesophageal reflux disease)    takes Pantoprazole daily  .  Gout   . Hemorrhoids   . Hiatal hernia   . History of colon polyps   . Hyperlipidemia    doesn't require meds  . Hypertension    takes Losartan daily; current not taking   . IC (interstitial cystitis)   . Insomnia    takes Nortrypyline nightly  . Joint pain   . Joint swelling   . Macular hole of both eyes   . Nocturia   . OSA (obstructive sleep apnea)    "mild; tried CPAP; couldn't tolerate" (09/01/2014)  . Paroxysmal atrial fibrillation (HCC)   . Restless leg syndrome   . Sciatic nerve pain   . Type II diabetes mellitus ([place])    "borderline"  . Urinary frequency     Family History  Problem Relation Age of Onset  . Emphysema Mother   . Kidney disease Father   . Emphysema Maternal Grandfather   . Colon cancer Neg Hx     Past Surgical History:  Procedure Laterality Date  . [place] VITRECTOMY WITH 20 GAUGE MVR PORT FOR MACULAR HOLE Right 10/08/2012   Procedure: 25 GAUGE PARS PLANA VITRECTOMY WITH 20 GAUGE MVR PORT FOR MACULAR HOLE; Membrame peel, serum patch; Laser treatment ; Gas exchange;  Surgeon: Hayden Pedro, MD;  Location: [facility];  Service: Ophthalmology;  Laterality: Right;  . 25 GAUGE PARS PLANA VITRECTOMY WITH 20 GAUGE MVR PORT FOR MACULAR HOLE Left 09/01/2014   Procedure: 25 GAUGE PARS PLANA VITRECTOMY WITH 20 GAUGE MVR PORT FOR MACULAR HOLE;  Surgeon: Hayden Pedro, MD;  Location: [facility];  Service: Ophthalmology;  Laterality: Left;  . ANTERIOR CERVICAL DECOMP/DISCECTOMY FUSION N/A 12/20/2015   Procedure: C5-6 Anterior Cervical Discectomy and Fusion, Allograft, and Plate;  Surgeon: [name], MD;  Location: [facility];  Service: Orthopedics;  Laterality: N/A;  . APPENDECTOMY  2009  . bladder lesion removed    . CARDIAC CATHETERIZATION  01/11/2012  . CATARACT EXTRACTION W/ INTRAOCULAR LENS  IMPLANT, BILATERAL Bilateral 2011  . CHOLECYSTECTOMY  2009  . COLONOSCOPY    . COMPRESSION HIP SCREW Left 04/04/2014   Procedure: COMPRESSION HIP LEFT;  Surgeon:  Mcarthur Rossetti, MD;  Location: [facility];  Service: Orthopedics;  Laterality: Left;  . CORONARY ANGIOPLASTY    . CORONARY ARTERY BYPASS GRAFT  01/18/2012   Procedure: CORONARY ARTERY BYPASS GRAFTING (CABG);  Surgeon: Ivin Poot, MD;  Location: [facility];  Service: Open Heart Surgery;  Laterality: N/A;  times four, using left internal mammary artery  . CYSTOSCOPY WITH URETEROSCOPY  2008; 2009   "painted inside of bladder wall"  . ELBOW SURGERY Right 1988   "reconstructive OR"  . ENDOVEIN HARVEST OF GREATER SAPHENOUS VEIN  01/18/2012   Procedure: ENDOVEIN HARVEST OF GREATER SAPHENOUS VEIN;  Surgeon: Tharon Aquas  Kerby Less, MD;  Location: [facility];  Service: Open Heart Surgery;  Laterality: Right;  . ESOPHAGOGASTRODUODENOSCOPY    . ESOPHAGOGASTRODUODENOSCOPY N/A 06/05/2014   Procedure: ESOPHAGOGASTRODUODENOSCOPY (EGD);  Surgeon: Irene Shipper, MD;  Location: [facility];  Service: Endoscopy;  Laterality: N/A;  . EXCISION/RELEASE BURSA HIP Left 07/30/2015   Procedure: EXCISION/RELEASE BURSA HIP;  Surgeon: Mcarthur Rossetti, MD;  Location: [facility];  Service: Orthopedics;  Laterality: Left;  . EXPLORATORY LAPAROTOMY  2009   with right colectomy  . FRACTURE SURGERY    . GAS INSERTION Left 09/01/2014   Procedure: INSERTION OF GAS;  Surgeon: Hayden Pedro, MD;  Location: [facility];  Service: Ophthalmology;  Laterality: Left;  C3F8  . GAS/FLUID EXCHANGE Left 09/01/2014   Procedure: GAS/FLUID EXCHANGE;  Surgeon: Hayden Pedro, MD;  Location: [facility];  Service: Ophthalmology;  Laterality: Left;  . HARDWARE REMOVAL Left 07/30/2015   Procedure: attempted LEFT HIP HARDWARE REMOVAL, LEFT HIP BURSECTOMY;  Surgeon: Mcarthur Rossetti, MD;  Location: [facility];  Service: Orthopedics;  Laterality: Left;  . HEEL SPUR EXCISION Bilateral 1970's  . INTERTROCHANTERIC HIP FRACTURE SURGERY Left 04/04/2014  . JOINT REPLACEMENT    . LEFT HEART CATHETERIZATION WITH CORONARY ANGIOGRAM N/A 01/11/2012   Procedure: LEFT HEART  CATHETERIZATION WITH CORONARY ANGIOGRAM;  Surgeon: Leonie Man, MD;  Location: [facility];  Service: Cardiovascular;  Laterality: N/A;  . LUMBAR [place] SURGERY  ?2010   "cleaned out the stenosis" (01/11/2012)  . MEMBRANE PEEL Left 09/01/2014   Procedure: MEMBRANE PEEL;  Surgeon: Hayden Pedro, MD;  Location: [facility];  Service: Ophthalmology;  Laterality: Left;  . NM MYOCAR MULTIPLE W/SPECT  04/09/2012   NORMAL STRESS NUCLEAR STUDY. EF 51%.  [name] PARS PLANA VITRECTOMY W/ REPAIR OF MACULAR HOLE Left 09/01/2014  . PARTIAL COLECTOMY     d/t diverticulosis  . PHOTOCOAGULATION WITH LASER Left 09/01/2014   Procedure: PHOTOCOAGULATION WITH LASER;  Surgeon: Hayden Pedro, MD;  Location: [facility];  Service: Ophthalmology;  Laterality: Left;  HEADSCOPE LASER  . RECONSTRUCTION MEDIAL COLLATERAL LIGAMENT ELBOW W/ TENDON GRAFT Right 1988 X 2  . SERUM PATCH Left 09/01/2014   Procedure: SERUM PATCH;  Surgeon: Hayden Pedro, MD;  Location: [facility];  Service: Ophthalmology;  Laterality: Left;  . TONSILLECTOMY AND ADENOIDECTOMY  ~1943  . TOTAL KNEE ARTHROPLASTY Bilateral ~ 2001; 2011   right; left  . TRANSTHORACIC ECHOCARDIOGRAM  01/04/2012   MODERATE SEVERE-SEVERE LVH. EF=>55%. MILDLY IMPAIRED LV RELAXATION. LA IS MODERATE TO SEVERE DILATED. MODERATE MR. MV LEAFLETS APPEAR THICHENED.  [name] TUMOR EXCISION Left 12/2011   arm   Social History   Occupational History  . Retired Retired   Social History Main Topics  . Smoking status: Never Smoker  . Smokeless tobacco: Never Used  . Alcohol use No     Comment: 09/01/2014 "glass of wine a few times/yr; if that"  . Drug use: No  . Sexual activity: Yes

## 2016-01-29 ENCOUNTER — Other Ambulatory Visit (INDEPENDENT_AMBULATORY_CARE_PROVIDER_SITE_OTHER): Payer: Self-pay | Admitting: Orthopaedic Surgery

## 2016-01-31 NOTE — Telephone Encounter (Signed)
Please advise 

## 2016-02-07 ENCOUNTER — Telehealth (INDEPENDENT_AMBULATORY_CARE_PROVIDER_SITE_OTHER): Payer: Self-pay | Admitting: Orthopaedic Surgery

## 2016-02-07 NOTE — Telephone Encounter (Signed)
Please advise 

## 2016-02-07 NOTE — Telephone Encounter (Signed)
Patient requesting oxycodone. Can pick up when called.

## 2016-02-08 NOTE — Telephone Encounter (Signed)
Time to stop narcotics. Surgery last month. Sorry Dr. Lorin Mercy said he needs to stop now.  I called and left message. u can call later and make sure he heard message. Thanks

## 2016-02-09 NOTE — Telephone Encounter (Signed)
noted 

## 2016-04-25 ENCOUNTER — Encounter: Payer: Self-pay | Admitting: Cardiology

## 2016-04-25 ENCOUNTER — Ambulatory Visit (INDEPENDENT_AMBULATORY_CARE_PROVIDER_SITE_OTHER): Payer: Medicare Other | Admitting: Cardiology

## 2016-04-25 VITALS — BP 98/58 | HR 76 | Ht 74.0 in | Wt 242.2 lb

## 2016-04-25 DIAGNOSIS — I480 Paroxysmal atrial fibrillation: Secondary | ICD-10-CM

## 2016-04-25 DIAGNOSIS — R0602 Shortness of breath: Secondary | ICD-10-CM | POA: Diagnosis not present

## 2016-04-25 DIAGNOSIS — R0609 Other forms of dyspnea: Secondary | ICD-10-CM | POA: Diagnosis not present

## 2016-04-25 DIAGNOSIS — K2100 Gastro-esophageal reflux disease with esophagitis, without bleeding: Secondary | ICD-10-CM

## 2016-04-25 DIAGNOSIS — I10 Essential (primary) hypertension: Secondary | ICD-10-CM

## 2016-04-25 DIAGNOSIS — E785 Hyperlipidemia, unspecified: Secondary | ICD-10-CM | POA: Diagnosis not present

## 2016-04-25 DIAGNOSIS — Z951 Presence of aortocoronary bypass graft: Secondary | ICD-10-CM

## 2016-04-25 DIAGNOSIS — N3010 Interstitial cystitis (chronic) without hematuria: Secondary | ICD-10-CM

## 2016-04-25 DIAGNOSIS — Z981 Arthrodesis status: Secondary | ICD-10-CM

## 2016-04-25 MED ORDER — METOPROLOL TARTRATE 25 MG PO TABS: 25.0000 mg | ORAL_TABLET | Freq: Two times a day (BID) | ORAL | 3 refills | Status: DC

## 2016-04-25 NOTE — Assessment & Plan Note (Addendum)
CABG x 22 Dec 2011. Pt was asymptomatic pre-CABG. He had CAD on a CT scan, subsequent Myoview was abnormal leading to cath and then CABG.  Myoview low risk Jan 2015 

## 2016-04-25 NOTE — Assessment & Plan Note (Signed)
Recurrent- Cipro added by PCP

## 2016-04-25 NOTE — Assessment & Plan Note (Signed)
The pt complains of new exertional dyspnea the last few weeks

## 2016-04-25 NOTE — Progress Notes (Signed)
[id number] Lucas Morales   [id number]  [id number]  Primary Physician Horatio Pel, MD Primary Cardiologist: Dr Gwenlyn Found  HPI:  83 y/o male with a history of CAD-s/p CABG x 22 Dec 2011. He was asymptomatic then, CAD noted on a chest CT. This led to a Myoview which was abnormal. Cath showed severe CAD and he underwent CABG Oct 2013. He had a Myoview and an echo in 2015 which were unremarkable. He has had problems with chronic dyspnea and has been worked up here and in [place] [place] without a specific diagnosis.   He is in the office today with complaints of new dyspnea on exertion for the past 3 weeks or so. The pt says he does water aerobics for an hour a day but now notes intense fatigue afterwards. He says he notes that a flight of stairs he has to take on a daily basis now causes unusual SOB. He has not had typical angina symptoms, but notes he did not have chest pain pre CABG either.    Current Outpatient Prescriptions  Medication Sig Dispense Refill  . allopurinol (ZYLOPRIM) 300 MG tablet Take 300 mg by mouth daily.  2  . aspirin EC 81 MG tablet Take 81 mg by mouth daily.    [name] atorvastatin (LIPITOR) 40 MG tablet TAKE 1 TABLET(40 MG) BY MOUTH DAILY 6 PM 30 tablet 9  . calcium carbonate (TUMS EX) 750 MG chewable tablet Chew 6-7 tablets by mouth daily.     [name] FIBER SELECT GUMMIES PO Take 3-4 tablets by mouth daily.     [name] loperamide (IMODIUM) 2 MG capsule Take 6 mg by mouth daily.     . Misc Natural Products (APPLE CIDER VINEGAR) TABS Take 1 tablet by mouth 2 (two) times daily.    . Multiple Vitamins-Minerals (MULTIVITAMIN WITH MINERALS) tablet Take 1 tablet by mouth daily.      . naproxen (NAPROSYN) 500 MG tablet TAKE 1 TABLET BY MOUTH UP TO TWICE DAILY WITH FOOD AS NEEDED FOR INFLAMMATION OR PAIN 60 tablet 0  . OVER THE COUNTER MEDICATION Apply 1 application topically daily as needed (For fungal infection.). Ciclopirox 8% Topical Solution    . pantoprazole (PROTONIX) 40 MG tablet  Take 1 tablet (40 mg total) by mouth 2 (two) times daily. 60 tablet 3  . Saxagliptin-Metformin (KOMBIGLYZE XR) 2.07-998 MG TB24 Take 1 tablet by mouth daily.    . tadalafil (CIALIS) 5 MG tablet Take 5 mg by mouth daily. For interstitial cystitis.    . metoprolol tartrate (LOPRESSOR) 25 MG tablet Take 1 tablet (25 mg total) by mouth 2 (two) times daily. 60 tablet 3   No current facility-administered medications for this visit.     Allergies  Allergen Reactions  . Morphine Itching    Social History   Social History  . Marital status: Married    Spouse name: N/A  . Number of children: 2  . Years of education: N/A   Occupational History  . Retired Retired   Social History Main Topics  . Smoking status: Never Smoker  . Smokeless tobacco: Never Used  . Alcohol use No     Comment: 09/01/2014 "glass of wine a few times/yr; if that"  . Drug use: No  . Sexual activity: Yes   Other Topics Concern  . Not on file   Social History Narrative  . No narrative on file     Review of Systems: General: negative for chills, fever, night sweats or weight changes.  Cardiovascular: negative for chest pain, dyspnea on exertion, edema, orthopnea, palpitations, paroxysmal nocturnal dyspnea or shortness of breath Dermatological: negative for rash Respiratory: negative for cough or wheezing-DOE Urologic: negative for hematuria-current flair of chronic interstitial cystitis Abdominal: negative for nausea, vomiting, diarrhea, bright red blood per rectum, melena, or hematemesis Neurologic: negative for visual changes, syncope, or dizziness All other systems reviewed and are otherwise negative except as noted above.    Blood pressure (!) 98/58, pulse 76, height 6\' 2"  (1.88 m), weight 242 lb 3.2 oz (109.9 kg).  General appearance: alert, cooperative and no distress Neck: no carotid bruit and no JVD Lungs: clear to auscultation bilaterally Heart: regular rate and rhythm Abdomen: soft, non-tender;  bowel sounds normal; no masses,  no organomegaly Extremities: extremities normal, atraumatic, no cyanosis or edema Skin: Skin color, texture, turgor normal. No rashes or lesions Neurologic: Grossly normal  EKG NSR  ASSESSMENT AND PLAN:   Shortness of breath on exertion The pt complains of new exertional dyspnea the last few weeks  Hx of CABG CABG x 22 Dec 2011. Pt was asymptomatic pre-CABG. He had CAD on a CT scan, subsequent Myoview was abnormal leading to cath and then CABG.  Myoview low risk Jan 2015  Dyslipidemia LDL goal 70 or less-followed by Dr Shelia Media.  Hypertension His B/P is now running low and he has had orthostatic dizziness   PAF (paroxysmal atrial fibrillation) ([place]) PAF post CABG 2013. Amiodarone and Coumadin stopped Jan 2015. NSR today  S/P cervical spinal fusion Oct 2017. Unfortunately he says he continues to have neck pain.  INTERSTITIAL CYSTITIS Recurrent- Cipro added by PCP  GERD With gastritis and esophageal stricture,  Endoscopy done March 2016  Pt says Hgb done by PCP recently was "2 points low"   PLAN  I suggested we change Lucas Morales's Coreg to Lopressor- this may have less of a anti hypertensive affect. I also suggested we proceed with a Lexiscan Myoview. I have requested copies of his recent lab work.   Kerin Ransom PA-C 04/25/2016 10:10 AM

## 2016-04-25 NOTE — Assessment & Plan Note (Signed)
With gastritis and esophageal stricture,  Endoscopy done March 2016  Pt says Hgb done by PCP recently was "2 points low"

## 2016-04-25 NOTE — Assessment & Plan Note (Signed)
LDL goal 70 or less-followed by Dr Shelia Media.

## 2016-04-25 NOTE — Patient Instructions (Signed)
Medication Instructions:  STOP Coreg  START metoprolol (Lopressor) 25mg  (1 tablet) two times daily.    Labwork: NONE  Testing/Procedures: Your physician has requested that you have a lexiscan myoview. For further information please visit [email or web address]. Please follow instruction sheet, as given.   Follow-Up: Your physician recommends that you schedule a follow-up appointment in: [place]   Any Other Special Instructions Will Be Listed Below (If Applicable).     If you need a refill on your cardiac medications before your next appointment, please call your pharmacy.

## 2016-04-25 NOTE — Assessment & Plan Note (Signed)
His B/P is now running low and he has had orthostatic dizziness

## 2016-04-25 NOTE — Assessment & Plan Note (Signed)
PAF post CABG 2013. Amiodarone and Coumadin stopped Jan 2015. NSR today

## 2016-04-25 NOTE — Assessment & Plan Note (Signed)
Oct 2017. Unfortunately he says he continues to have neck pain.

## 2016-04-28 ENCOUNTER — Telehealth (HOSPITAL_COMMUNITY): Payer: Self-pay

## 2016-04-28 NOTE — Telephone Encounter (Signed)
Encounter complete. 

## 2016-05-03 ENCOUNTER — Ambulatory Visit (HOSPITAL_COMMUNITY)
Admission: RE | Admit: 2016-05-03 | Discharge: 2016-05-03 | Disposition: A | Discharge status: Home / Self Care | Payer: Medicare Other | Source: Ambulatory Visit | Attending: Cardiology | Admitting: Cardiology

## 2016-05-03 ENCOUNTER — Other Ambulatory Visit: Payer: Self-pay | Admitting: Cardiovascular Disease

## 2016-05-03 DIAGNOSIS — I2510 Atherosclerotic heart disease of native coronary artery without angina pectoris: Secondary | ICD-10-CM | POA: Insufficient documentation

## 2016-05-03 DIAGNOSIS — I480 Paroxysmal atrial fibrillation: Secondary | ICD-10-CM | POA: Insufficient documentation

## 2016-05-03 DIAGNOSIS — E785 Hyperlipidemia, unspecified: Secondary | ICD-10-CM | POA: Insufficient documentation

## 2016-05-03 DIAGNOSIS — Z951 Presence of aortocoronary bypass graft: Secondary | ICD-10-CM | POA: Insufficient documentation

## 2016-05-03 DIAGNOSIS — R0609 Other forms of dyspnea: Secondary | ICD-10-CM | POA: Insufficient documentation

## 2016-05-03 DIAGNOSIS — I10 Essential (primary) hypertension: Secondary | ICD-10-CM | POA: Insufficient documentation

## 2016-05-03 DIAGNOSIS — K219 Gastro-esophageal reflux disease without esophagitis: Secondary | ICD-10-CM | POA: Diagnosis not present

## 2016-05-03 LAB — MYOCARDIAL PERFUSION IMAGING
LV dias vol: 154 mL (ref 62–150)
LV sys vol: 75 mL
Peak HR: 88 {beats}/min
Rest HR: 75 {beats}/min
SDS: 4
SRS: 3
SSS: 5
TID: 1.04

## 2016-05-03 MED ORDER — TECHNETIUM TC 99M TETROFOSMIN IV KIT
10.9000 | PACK | Freq: Once | INTRAVENOUS | Status: AC | PRN
  Administered 2016-05-03: 10.9 via INTRAVENOUS
  Filled 2016-05-03: qty 11

## 2016-05-03 MED ORDER — TECHNETIUM TC 99M TETROFOSMIN IV KIT
30.9000 | PACK | Freq: Once | INTRAVENOUS | Status: AC | PRN
  Administered 2016-05-03: 30.9 via INTRAVENOUS
  Filled 2016-05-03: qty 31

## 2016-05-03 MED ORDER — REGADENOSON 0.4 MG/5ML IV SOLN
0.4000 mg | Freq: Once | INTRAVENOUS | Status: AC
  Administered 2016-05-03: 0.4 mg via INTRAVENOUS

## 2016-06-06 ENCOUNTER — Ambulatory Visit (INDEPENDENT_AMBULATORY_CARE_PROVIDER_SITE_OTHER): Payer: Medicare Other | Admitting: Orthopaedic Surgery

## 2016-06-06 ENCOUNTER — Ambulatory Visit (INDEPENDENT_AMBULATORY_CARE_PROVIDER_SITE_OTHER): Payer: Medicare Other

## 2016-06-06 ENCOUNTER — Encounter (INDEPENDENT_AMBULATORY_CARE_PROVIDER_SITE_OTHER): Payer: Self-pay | Admitting: Orthopaedic Surgery

## 2016-06-06 VITALS — BP 154/75 | HR 79 | Ht 74.0 in | Wt 235.0 lb

## 2016-06-06 DIAGNOSIS — M542 Cervicalgia: Secondary | ICD-10-CM

## 2016-06-06 DIAGNOSIS — M79672 Pain in left foot: Secondary | ICD-10-CM

## 2016-06-06 DIAGNOSIS — Z981 Arthrodesis status: Secondary | ICD-10-CM | POA: Diagnosis not present

## 2016-06-06 NOTE — Progress Notes (Signed)
Office Visit Note   Patient: Lucas Morales           Date of Birth: September 15, 1933           MRN: [id number] Visit Date: 06/06/2016              Requested by: Deland Pretty, MD [address] [place], [place] [address] PCP: Horatio Pel, MD   Assessment & Plan: Visit Diagnoses:  1. Neck pain   2. Pain in left foot   3. S/P cervical spinal fusion     Plan: He has a prescription for physical therapy for his neck Avastin uses collar intermittently for 3 hours a day whenever he is having some increased symptoms see if this gives him some symptomatic relief. He has home cervical traction but it really didn't work previously. Return in 8 weeks for recheck.  Follow-Up Instructions: Return in about 8 weeks (around 08/01/2016).   Orders:  Orders Placed This Encounter  Procedures  . XR Cervical Spine 2 or 3 views  . XR Foot Complete Left   No orders of the defined types were placed in this encounter.     Procedures: No procedures performed   Clinical Data: No additional findings.   Subjective: Chief Complaint  Patient presents with  . Neck - Pain  . Left Foot - Pain    Patient presents today with two complaints. The first is left sided neck pain. This pain radiates down into the left shoulder. He is status post C5-6 ACD&F , Allograft, Plate on 03/26/107. He is 6 months out. He states this pain is the same as it was prior to the surgery. He would like to see if we could get insurance to approve another scan to find out what is going on.   The second problem is with his left third toe. He woke up in pain 2 nights ago. The toe is red and bruised. He does not recall injuring it, but states he had recently clipped his nails. He is prediabetic, which is controlled with a last A1C of 6.1. He just wants to be sure this is not a diabetic issue.     Review of Systems  Constitutional: Negative for chills and diaphoresis.  HENT: Negative for ear discharge, ear  pain and nosebleeds.   Eyes: Negative for discharge and visual disturbance.  Respiratory: Negative for cough, choking and shortness of breath.   Cardiovascular: Negative for chest pain and palpitations.  Gastrointestinal: Negative for abdominal distention and abdominal pain.  Endocrine: Negative for cold intolerance and heat intolerance.  Genitourinary: Negative for flank pain and hematuria.  Musculoskeletal:       Well-healed the cervical incision from previous C5-6 fusion previous lumbar decompression.  Skin: Negative for rash and wound.  Neurological: Negative for seizures and speech difficulty.  Hematological: Negative for adenopathy. Does not bruise/bleed easily.  Psychiatric/Behavioral: Negative for agitation and suicidal ideas.     Objective: Vital Signs: BP (!) 154/75   Pulse 79   Ht 6\' 2"  (1.88 m)   Wt 235 lb (106.6 kg)   BMI 30.17 kg/m   Physical Exam  Constitutional: He is oriented to person, place, and time. He appears well-developed and well-nourished.  HENT:  Head: Normocephalic and atraumatic.  Eyes: EOM are normal. Pupils are equal, round, and reactive to light.  Neck: No tracheal deviation present. No thyromegaly present.  Cardiovascular: Normal rate.   Pulmonary/Chest: Effort normal. He has no wheezes.  Abdominal: Soft. Bowel sounds  are normal.  Musculoskeletal:  Well-healed anterior cervical incision. Mild discomfort shoulder range of motion. Normal heel-to-toe gait. Mild degenerative changes in the digits of the upper extremity DIP joints. Lower extremity shows the left fourth toe ecchymosis at the DIP region and also at the MCP joint. Collateral ligaments are stable he has some decreased sensation consistent with peripheral neuropathy. No tenderness over the fifth metatarsal.  Neurological: He is alert and oriented to person, place, and time.  Skin: Skin is warm and dry. Capillary refill takes less than 2 seconds.  Psychiatric: He has a normal mood and  affect. His behavior is normal. Judgment and thought content normal.    Ortho Exam  Specialty Comments:  No specialty comments available.  Imaging: Xr Foot Complete Left  Result Date: 06/06/2016 Three-view x-rays left foot obtained. This shows an old distal shaft fracture fifth metatarsal bone which is healed. No fracture seen at the fourth toe which is present with increased soft tissue swelling. Impression: No acute fracture seen fourth toe. Old healed fifth metatarsal shaft fracture  Xr Cervical Spine 2 Or 3 Views  Result Date: 06/06/2016 AP and lateral cervical spine x-rays are obtained and reviewed. This shows the incorporation of the C5-6 fusion good position plate and screws. He has some anterolisthesis at C7-T1 and also autofusion at C3-4 primarily in the posterior aspect of the disc space. Some C1-C2 degeneration. Impression: Post C5-6 fusion with good incorporation. No motion on flexion-extension.    PMFS History: Patient Active Problem List   Diagnosis Date Noted  . S/P cervical spinal fusion 01/25/2016  . Cervical spinal stenosis 12/20/2015  . Cervical spondylosis 12/20/2015  . Painful orthopaedic hardware left hip with chronic trochanteric bursitis 07/30/2015  . Hip bursitis 07/30/2015  . Trochanteric bursitis of left hip 07/30/2015  . Atypical chest pain 10/19/2014  . Macular hole, left eye 09/01/2014  . Macular hole of left eye 08/28/2014  . Other pancytopenia ([place])   . Sliding hiatal hernia   . Gastric erosions   . GI bleeding 06/04/2014  . Acute GI bleeding 06/04/2014  . Orthostatic hypotension 06/04/2014  . Acute upper gastrointestinal bleeding 06/04/2014  . Other specified diabetes mellitus without complications   . Acute blood loss anemia   . Thrombocytopenia ([place])   . Closed left hip fracture ([place]) 04/03/2014  . Fracture, femur ([place]) 04/03/2014  . Femur fracture ([place]) 04/03/2014  . Upper airway cough syndrome 05/16/2013  . OSA (obstructive sleep  apnea) 10/21/2012  . Macular hole 10/08/2012  . Preoperative clearance 09/30/2012  . PAF (paroxysmal atrial fibrillation) ([place]) 01/21/2012  . Shortness of breath on exertion 01/11/2012  . Bradycardia, sinus 01/11/2012  . Hx of CABG 01/11/2012  . Hypertension   . Dyslipidemia   . ABDOMINAL PAIN-LLQ 02/04/2010  . HEMORRHOIDS 08/31/2008  . ESOPHAGEAL STRICTURE 08/31/2008  . GERD 08/31/2008  . Gastritis and gastroduodenitis 08/31/2008  . HIATAL HERNIA 08/31/2008  . DIVERTICULOSIS, COLON 08/31/2008  . INTERSTITIAL CYSTITIS 08/31/2008  . COLONIC POLYPS, ADENOMATOUS, HX OF 08/31/2008   Past Medical History:  Diagnosis Date  . Adenomatous colon polyp   . Alpha-1-antitrypsin deficiency ([place])   . Alpha-1-antitrypsin deficiency carrier ([place])   . Anemia   . Anxiety   . Arthritis    "knees; bad in my back" (09/01/2014)  . Bruises easily   . Chronic bronchitis ([place])    "get it pretty much q yr" (09/01/2014)  . Chronic lower back pain   . Coronary artery disease   . Depression    "  related to health" (09/01/2014)  . Diarrhea    has had a part of colon removed  . Diverticulitis   . Diverticulosis   . Dyspnea on exertion   . Dysrhythmia    bradycardia in the 30's (01/11/2012).. Afib briefly after CABG  . Esophageal dysmotility   . Esophageal stricture   . Gastritis   . GERD (gastroesophageal reflux disease)    takes Pantoprazole daily  . Gout   . Hemorrhoids   . Hiatal hernia   . History of colon polyps   . Hyperlipidemia    doesn't require meds  . Hypertension    takes Losartan daily; current not taking   . IC (interstitial cystitis)   . Insomnia    takes Nortrypyline nightly  . Joint pain   . Joint swelling   . Macular hole of both eyes   . Nocturia   . OSA (obstructive sleep apnea)    "mild; tried CPAP; couldn't tolerate" (09/01/2014)  . Paroxysmal atrial fibrillation ([facility])   . Restless leg syndrome   . Sciatic nerve pain   . Type II diabetes mellitus ([place])     "borderline"  . Urinary frequency     Family History  Problem Relation Age of Onset  . Emphysema Mother   . Kidney disease Father   . Emphysema Maternal Grandfather   . Colon cancer Neg Hx     Past Surgical History:  Procedure Laterality Date  . [place] VITRECTOMY WITH 20 GAUGE MVR PORT FOR MACULAR HOLE Right 10/08/2012   Procedure: 25 GAUGE PARS PLANA VITRECTOMY WITH 20 GAUGE MVR PORT FOR MACULAR HOLE; Membrame peel, serum patch; Laser treatment ; Gas exchange;  Surgeon: Hayden Pedro, MD;  Location: [facility];  Service: Ophthalmology;  Laterality: Right;  . 25 GAUGE PARS PLANA VITRECTOMY WITH 20 GAUGE MVR PORT FOR MACULAR HOLE Left 09/01/2014   Procedure: 25 GAUGE PARS PLANA VITRECTOMY WITH 20 GAUGE MVR PORT FOR MACULAR HOLE;  Surgeon: Hayden Pedro, MD;  Location: [facility];  Service: Ophthalmology;  Laterality: Left;  . ANTERIOR CERVICAL DECOMP/DISCECTOMY FUSION N/A 12/20/2015   Procedure: C5-6 Anterior Cervical Discectomy and Fusion, Allograft, and Plate;  Surgeon: [name], MD;  Location: [facility];  Service: Orthopedics;  Laterality: N/A;  . APPENDECTOMY  2009  . bladder lesion removed    . CARDIAC CATHETERIZATION  01/11/2012  . CATARACT EXTRACTION W/ INTRAOCULAR LENS  IMPLANT, BILATERAL Bilateral 2011  . CHOLECYSTECTOMY  2009  . COLONOSCOPY    . COMPRESSION HIP SCREW Left 04/04/2014   Procedure: COMPRESSION HIP LEFT;  Surgeon: Mcarthur Rossetti, MD;  Location: [facility];  Service: Orthopedics;  Laterality: Left;  . CORONARY ANGIOPLASTY    . CORONARY ARTERY BYPASS GRAFT  01/18/2012   Procedure: CORONARY ARTERY BYPASS GRAFTING (CABG);  Surgeon: Ivin Poot, MD;  Location: [facility];  Service: Open Heart Surgery;  Laterality: N/A;  times four, using left internal mammary artery  . CYSTOSCOPY WITH URETEROSCOPY  2008; 2009   "painted inside of bladder wall"  . ELBOW SURGERY Right 1988   "reconstructive OR"  . ENDOVEIN HARVEST OF GREATER SAPHENOUS VEIN  01/18/2012    Procedure: ENDOVEIN HARVEST OF GREATER SAPHENOUS VEIN;  Surgeon: Ivin Poot, MD;  Location: [facility];  Service: Open Heart Surgery;  Laterality: Right;  . ESOPHAGOGASTRODUODENOSCOPY    . ESOPHAGOGASTRODUODENOSCOPY N/A 06/05/2014   Procedure: ESOPHAGOGASTRODUODENOSCOPY (EGD);  Surgeon: Irene Shipper, MD;  Location: [facility];  Service: Endoscopy;  Laterality:  N/A;  [place] HIP Left 07/30/2015   Procedure: EXCISION/RELEASE BURSA HIP;  Surgeon: Mcarthur Rossetti, MD;  Location: [facility];  Service: Orthopedics;  Laterality: Left;  . EXPLORATORY LAPAROTOMY  2009   with right colectomy  . FRACTURE SURGERY    . GAS INSERTION Left 09/01/2014   Procedure: INSERTION OF GAS;  Surgeon: Hayden Pedro, MD;  Location: [facility];  Service: Ophthalmology;  Laterality: Left;  C3F8  . GAS/FLUID EXCHANGE Left 09/01/2014   Procedure: GAS/FLUID EXCHANGE;  Surgeon: Hayden Pedro, MD;  Location: [facility];  Service: Ophthalmology;  Laterality: Left;  . HARDWARE REMOVAL Left 07/30/2015   Procedure: attempted LEFT HIP HARDWARE REMOVAL, LEFT HIP BURSECTOMY;  Surgeon: Mcarthur Rossetti, MD;  Location: [facility];  Service: Orthopedics;  Laterality: Left;  . HEEL SPUR EXCISION Bilateral 1970's  . INTERTROCHANTERIC HIP FRACTURE SURGERY Left 04/04/2014  . JOINT REPLACEMENT    . LEFT HEART CATHETERIZATION WITH CORONARY ANGIOGRAM N/A 01/11/2012   Procedure: LEFT HEART CATHETERIZATION WITH CORONARY ANGIOGRAM;  Surgeon: Leonie Man, MD;  [facility];  Service: Cardiovascular;  Laterality: N/A;  . LUMBAR [place] SURGERY  ?2010   "cleaned out the stenosis" (01/11/2012)  . MEMBRANE PEEL Left 09/01/2014   Procedure: MEMBRANE PEEL;  Surgeon: Hayden Pedro, MD;  Location: [facility];  Service: Ophthalmology;  Laterality: Left;  . NM MYOCAR MULTIPLE W/SPECT  04/09/2012   NORMAL STRESS NUCLEAR STUDY. EF 51%.  [name] PARS PLANA VITRECTOMY W/ REPAIR OF MACULAR HOLE Left 09/01/2014  . PARTIAL COLECTOMY     d/t  diverticulosis  . PHOTOCOAGULATION WITH LASER Left 09/01/2014   Procedure: PHOTOCOAGULATION WITH LASER;  Surgeon: Hayden Pedro, MD;  Location: [facility];  Service: Ophthalmology;  Laterality: Left;  HEADSCOPE LASER  . RECONSTRUCTION MEDIAL COLLATERAL LIGAMENT ELBOW W/ TENDON GRAFT Right 1988 X 2  . SERUM PATCH Left 09/01/2014   Procedure: SERUM PATCH;  Surgeon: Hayden Pedro, MD;  Location: [facility];  Service: Ophthalmology;  Laterality: Left;  . TONSILLECTOMY AND ADENOIDECTOMY  ~1943  . TOTAL KNEE ARTHROPLASTY Bilateral ~ 2001; 2011   right; left  . TRANSTHORACIC ECHOCARDIOGRAM  01/04/2012   MODERATE SEVERE-SEVERE LVH. EF=>55%. MILDLY IMPAIRED LV RELAXATION. LA IS MODERATE TO SEVERE DILATED. MODERATE MR. MV LEAFLETS APPEAR THICHENED.  [name] TUMOR EXCISION Left 12/2011   arm   Social History   Occupational History  . Retired Retired   Social History Main Topics  . Smoking status: Never Smoker  . Smokeless tobacco: Never Used  . Alcohol use No     Comment: 09/01/2014 "glass of wine a few times/yr; if that"  . Drug use: No  . Sexual activity: Yes

## 2016-06-07 ENCOUNTER — Ambulatory Visit: Payer: Medicare Other | Admitting: Cardiovascular Disease

## 2016-06-15 ENCOUNTER — Other Ambulatory Visit (INDEPENDENT_AMBULATORY_CARE_PROVIDER_SITE_OTHER): Payer: Self-pay | Admitting: Orthopaedic Surgery

## 2016-07-06 ENCOUNTER — Ambulatory Visit (INDEPENDENT_AMBULATORY_CARE_PROVIDER_SITE_OTHER): Payer: Medicare Other | Admitting: Cardiovascular Disease

## 2016-07-06 ENCOUNTER — Encounter: Payer: Self-pay | Admitting: Cardiovascular Disease

## 2016-07-06 DIAGNOSIS — E785 Hyperlipidemia, unspecified: Secondary | ICD-10-CM | POA: Diagnosis not present

## 2016-07-06 DIAGNOSIS — Z951 Presence of aortocoronary bypass graft: Secondary | ICD-10-CM

## 2016-07-06 DIAGNOSIS — I480 Paroxysmal atrial fibrillation: Secondary | ICD-10-CM | POA: Diagnosis not present

## 2016-07-06 DIAGNOSIS — I10 Essential (primary) hypertension: Secondary | ICD-10-CM

## 2016-07-06 NOTE — Patient Instructions (Signed)
Medication Instructions:  Your physician recommends that you continue on your current medications as directed. Please refer to the Current Medication list given to you today.  Labwork: We will obtain lab work from Dr. Shelia Media  Follow-Up: Your physician wants you to follow-up in: 1 YEAR with Dr. Gwenlyn Found. You will receive a reminder letter in the mail two months in advance. If you don't receive a letter, please call our office to schedule the follow-up appointment.   Any Other Special Instructions Will Be Listed Below (If Applicable).     If you need a refill on your cardiac medications before your next appointment, please call your pharmacy.

## 2016-07-06 NOTE — Assessment & Plan Note (Signed)
History of hypertension blood pressure measured today at 120/64. He is on metoprolol. Continue current meds at current dosing.

## 2016-07-06 NOTE — Assessment & Plan Note (Signed)
History of dyslipidemia on statin therapy followed by his PCP 

## 2016-07-06 NOTE — Progress Notes (Signed)
[birth year] Lucas Morales   [birth year]  [id number]  Primary Physician Horatio Pel, MD Primary Cardiologist: Lorretta Harp MD [name]  HPI:    The patient is a 83 year old, moderately overweight, married, Caucasian male, father of two, who I last saw in the office 06/23/15. He has a history of hypertension and hyperlipidemia. He was initially referred to me because of dyspnea. A CT angiogram of his heart performed three years prior showed moderate plaquing in all three coronary arteries with a calcium score of 800, and a 2D echo revealed an EF of 45 to 50%. Because of increasing symptoms of dyspnea on exertion, as well as chest pain radiating to his left upper extremity, a Myoview stress test was performed on January 04, 2012, which showed inferolateral ischemia. Ultimately he was catheterized by Dr. Glenetta Hew on October 24, revealing two vessel disease, and he underwent coronary artery bypass grafting x4 by Dr. Tharon Aquas Trigt on October 31 with a LIMA to his LAD, vein to a distal LAD, obtuse marginal brach 3 and 4. His postoperative course was complicated by PAF, for which he was placed on Coumadin anticoagulation, Digoxin, and amiodarone. When I saw him back in January, he had a prolonged episode of chest pain. A Myoview stress test performed on January 21 was nonischemic. A MCOT showed no evidence of PAF, and as a result, I stopped his amiodarone, Coumadin, and Digoxin. He participated in cardiac rehab.  He saw Tenny Craw [facility] back in the office in March and July. He's been complaining of progressive dyspnea and chest pain. I did order a Myoview stress test and 2-D echocardiogram which revealed normal LV systolic function with severe concentric left hypertrophy. The Myoview showed subtle interval septal ischemia I do not think were significantly abnormal to warrant further evaluation at this time. Since I saw him in the office back in April 2017 he's done well. He  has had a failed left hip revision by Dr. [name] and a 5/6 discectomy by Dr. Lorin Mercy in November of last year. He saw Kerin Ransom in the office 04/25/16 complaining of dyspnea. A Myoview stress test was ordered 05/03/16 which was entirely normal. He does admit to being under a lot of stress at home because of family issues which I suspect is contributory..  Current Outpatient Prescriptions  Medication Sig Dispense Refill  . allopurinol (ZYLOPRIM) 300 MG tablet Take 300 mg by mouth daily.  2  . aspirin EC 81 MG tablet Take 81 mg by mouth daily.    [name] atorvastatin (LIPITOR) 40 MG tablet TAKE 1 TABLET(40 MG) BY MOUTH DAILY 6 PM 30 tablet 9  . calcium carbonate (TUMS EX) 750 MG chewable tablet Chew 6-7 tablets by mouth daily.     [name] erythromycin ophthalmic ointment Place 1 application into both eyes 2 (two) times daily as needed.  1  . FIBER SELECT GUMMIES PO Take 3-4 tablets by mouth daily.     [name] loperamide (IMODIUM) 2 MG capsule Take 6 mg by mouth daily.     . metoprolol tartrate (LOPRESSOR) 25 MG tablet Take 1 tablet (25 mg total) by mouth 2 (two) times daily. 60 tablet 3  . Misc Natural Products (APPLE CIDER VINEGAR) TABS Take 1 tablet by mouth 2 (two) times daily.    . Multiple Vitamins-Minerals (MULTIVITAMIN WITH MINERALS) tablet Take 1 tablet by mouth daily.      . naproxen (NAPROSYN) 500 MG tablet TAKE 1 TABLET BY MOUTH UP  TO TWICE DAILY WITH FOOD AS NEEDED FOR INFLAMMATION OR PAIN 60 tablet 0  . OVER THE COUNTER MEDICATION Apply 1 application topically daily as needed (For fungal infection.). Ciclopirox 8% Topical Solution    . pantoprazole (PROTONIX) 40 MG tablet Take 1 tablet (40 mg total) by mouth 2 (two) times daily. 60 tablet 3  . Saxagliptin-Metformin (KOMBIGLYZE XR) 2.07-998 MG TB24 Take 1 tablet by mouth daily.    . tadalafil (CIALIS) 5 MG tablet Take 5 mg by mouth daily. For interstitial cystitis.     No current facility-administered medications for this visit.     Allergies    Allergen Reactions  . Morphine Itching    Social History   Social History  . Marital status: Married    Spouse name: N/A  . Number of children: 2  . Years of education: N/A   Occupational History  . Retired Retired   Social History Main Topics  . Smoking status: Never Smoker  . Smokeless tobacco: Never Used  . Alcohol use No     Comment: 09/01/2014 "glass of wine a few times/yr; if that"  . Drug use: No  . Sexual activity: Yes   Other Topics Concern  . Not on file   Social History Narrative  . No narrative on file     Review of Systems: General: negative for chills, fever, night sweats or weight changes.  Cardiovascular: negative for chest pain, dyspnea on exertion, edema, orthopnea, palpitations, paroxysmal nocturnal dyspnea or shortness of breath Dermatological: negative for rash Respiratory: negative for cough or wheezing Urologic: negative for hematuria Abdominal: negative for nausea, vomiting, diarrhea, bright red blood per rectum, melena, or hematemesis Neurologic: negative for visual changes, syncope, or dizziness All other systems reviewed and are otherwise negative except as noted above.    Blood pressure 120/64, pulse 80, height 6\' 2"  (1.88 m), weight 237 lb 6.4 oz (107.7 kg).  General appearance: alert and no distress Neck: no adenopathy, no carotid bruit, no JVD, supple, symmetrical, trachea midline and thyroid not enlarged, symmetric, no tenderness/mass/nodules Lungs: clear to auscultation bilaterally Heart: regular rate and rhythm, S1, S2 normal, no murmur, click, rub or gallop Extremities: extremities normal, atraumatic, no cyanosis or edema  EKG not performed today  ASSESSMENT AND PLAN:   Hypertension History of hypertension blood pressure measured today at 120/64. He is on metoprolol. Continue current meds at current dosing.  Hx of CABG History of coronary artery bypass grafting X 22 December 2011. He said several Myoview stress test since that  time the most recent one was 05/03/16 which was entirely normal. This was done in evaluation of dyspnea on exertion.  PAF (paroxysmal atrial fibrillation) (HCC) History of paroxysmal fibrillation in the peri-CABG time window shown to have resolved currently off all A. fib medications including anticoagulation.  Dyslipidemia History of dyslipidemia on statin therapy followed by his PCP      Lorretta Harp MD [facility], [facility] 07/06/2016 2:17 PM

## 2016-07-06 NOTE — Assessment & Plan Note (Signed)
History of paroxysmal fibrillation in the peri-CABG time window shown to have resolved currently off all A. fib medications including anticoagulation.

## 2016-07-06 NOTE — Assessment & Plan Note (Signed)
History of coronary artery bypass grafting X 22 December 2011. He said several Myoview stress test since that time the most recent one was 05/03/16 which was entirely normal. This was done in evaluation of dyspnea on exertion.

## 2016-07-07 ENCOUNTER — Ambulatory Visit: Payer: Medicare Other | Admitting: Cardiovascular Disease

## 2016-07-14 ENCOUNTER — Other Ambulatory Visit (INDEPENDENT_AMBULATORY_CARE_PROVIDER_SITE_OTHER): Payer: Self-pay | Admitting: Orthopaedic Surgery

## 2016-07-25 ENCOUNTER — Encounter (INDEPENDENT_AMBULATORY_CARE_PROVIDER_SITE_OTHER): Payer: Self-pay

## 2016-07-25 ENCOUNTER — Ambulatory Visit (INDEPENDENT_AMBULATORY_CARE_PROVIDER_SITE_OTHER): Payer: Medicare Other | Admitting: Orthopaedic Surgery

## 2016-08-21 ENCOUNTER — Other Ambulatory Visit: Payer: Self-pay | Admitting: Cardiology

## 2016-08-21 NOTE — Telephone Encounter (Signed)
Rx request sent to pharmacy.  

## 2016-08-30 ENCOUNTER — Other Ambulatory Visit: Payer: Self-pay | Admitting: Oncology

## 2016-08-30 DIAGNOSIS — D649 Anemia, unspecified: Secondary | ICD-10-CM

## 2016-08-31 ENCOUNTER — Other Ambulatory Visit (HOSPITAL_BASED_OUTPATIENT_CLINIC_OR_DEPARTMENT_OTHER): Payer: Medicare Other

## 2016-08-31 DIAGNOSIS — D649 Anemia, unspecified: Secondary | ICD-10-CM

## 2016-08-31 DIAGNOSIS — D539 Nutritional anemia, unspecified: Secondary | ICD-10-CM | POA: Diagnosis not present

## 2016-08-31 LAB — COMPREHENSIVE METABOLIC PANEL
ALBUMIN: 3.9 g/dL (ref 3.5–5.0)
ALT: 19 U/L (ref 0–55)
AST: 22 U/L (ref 5–34)
Alkaline Phosphatase: 60 U/L (ref 40–150)
Anion Gap: 8 mEq/L (ref 3–11)
BUN: 21.6 mg/dL (ref 7.0–26.0)
CALCIUM: 9.5 mg/dL (ref 8.4–10.4)
CHLORIDE: 111 meq/L — AB (ref 98–109)
CO2: 22 mEq/L (ref 22–29)
CREATININE: 1.1 mg/dL (ref 0.7–1.3)
EGFR: 63 mL/min/{1.73_m2} — ABNORMAL LOW (ref >90)
GLUCOSE: 120 mg/dL (ref 70–140)
Potassium: 3.9 mEq/L (ref 3.5–5.1)
Sodium: 141 mEq/L (ref 136–145)
Total Bilirubin: 1.16 mg/dL (ref 0.20–1.20)
Total Protein: 6.3 g/dL — ABNORMAL LOW (ref 6.4–8.3)

## 2016-08-31 LAB — IRON AND TIBC
%SAT: 31 % (ref 20–55)
Iron: 109 ug/dL (ref 42–163)
TIBC: 355 ug/dL (ref 202–409)
UIBC: 246 ug/dL (ref 117–376)

## 2016-08-31 LAB — CBC WITH DIFFERENTIAL/PLATELET
BASO%: 0.4 % (ref 0.0–2.0)
Basophils Absolute: 0 10*3/uL (ref 0.0–0.1)
EOS%: 1.8 % (ref 0.0–7.0)
Eosinophils Absolute: 0.1 10*3/uL (ref 0.0–0.5)
HCT: 35.8 % — ABNORMAL LOW (ref 38.4–49.9)
HEMOGLOBIN: 12.3 g/dL — AB (ref 13.0–17.1)
LYMPH#: 1 10*3/uL (ref 0.9–3.3)
LYMPH%: 22.4 % (ref 14.0–49.0)
MCH: 33.3 pg (ref 27.2–33.4)
MCHC: 34.4 g/dL (ref 32.0–36.0)
MCV: 97 fL (ref 79.3–98.0)
MONO#: 0.3 10*3/uL (ref 0.1–0.9)
MONO%: 7.4 % (ref 0.0–14.0)
NEUT#: 3 10*3/uL (ref 1.5–6.5)
NEUT%: 68 % (ref 39.0–75.0)
Platelets: 136 10*3/uL — ABNORMAL LOW (ref 140–400)
RBC: 3.69 10*6/uL — ABNORMAL LOW (ref 4.20–5.82)
RDW: 14.7 % — AB (ref 11.0–14.6)
WBC: 4.5 10*3/uL (ref 4.0–10.3)

## 2016-08-31 LAB — CHCC SMEAR

## 2016-08-31 LAB — FERRITIN: FERRITIN: 28 ng/mL (ref 22–316)

## 2016-09-01 LAB — VITAMIN B12: Vitamin B12: 549 pg/mL (ref 232–1245)

## 2016-09-01 LAB — ERYTHROPOIETIN: ERYTHROPOIETIN: 22.8 m[IU]/mL — AB (ref 2.6–18.5)

## 2016-09-04 LAB — MULTIPLE MYELOMA PANEL, SERUM
Albumin SerPl Elph-Mcnc: 3.7 g/dL (ref 2.9–4.4)
Albumin/Glob SerPl: 1.5 (ref 0.7–1.7)
Alpha 1: 0.2 g/dL (ref 0.0–0.4)
Alpha2 Glob SerPl Elph-Mcnc: 0.7 g/dL (ref 0.4–1.0)
B-GLOBULIN SERPL ELPH-MCNC: 0.9 g/dL (ref 0.7–1.3)
Gamma Glob SerPl Elph-Mcnc: 0.7 g/dL (ref 0.4–1.8)
Globulin, Total: 2.5 g/dL (ref 2.2–3.9)
IGA/IMMUNOGLOBULIN A, SERUM: 86 mg/dL (ref 61–437)
IgG, Qn, Serum: 588 mg/dL — ABNORMAL LOW (ref 700–1600)
IgM, Qn, Serum: 74 mg/dL (ref 15–143)
TOTAL PROTEIN: 6.2 g/dL (ref 6.0–8.5)

## 2016-09-06 ENCOUNTER — Other Ambulatory Visit: Payer: Medicare Other

## 2016-09-06 ENCOUNTER — Ambulatory Visit (HOSPITAL_BASED_OUTPATIENT_CLINIC_OR_DEPARTMENT_OTHER): Payer: Medicare Other | Admitting: Oncology

## 2016-09-06 VITALS — BP 177/76 | HR 84 | Temp 98.7°F | Resp 20 | Ht 74.0 in | Wt 238.1 lb

## 2016-09-06 DIAGNOSIS — D539 Nutritional anemia, unspecified: Secondary | ICD-10-CM | POA: Diagnosis not present

## 2016-09-06 DIAGNOSIS — D649 Anemia, unspecified: Secondary | ICD-10-CM

## 2016-09-06 NOTE — Progress Notes (Signed)
Hematology and Oncology Follow Up Visit  Lucas Morales [id number] [birth year] 83 y.o. 09/06/2016 3:25 PM Lucas Morales, MDPharr, Thayer Jew, MD   Principle Diagnosis: 83 year old with microcytic anemia diagnosed in 2017. At that time his hemoglobin was 11.6 and mild thrombocytopenia was noted.  Current therapy: Observation and surveillance.  Interim History: Lucas Morales presents today for a follow-up visit. Since the last visit, he reports no major changes in his health. He does report faint adenopathy which has not changed at this time. He denied any excessive fatigue or tiredness. He denied any hematochezia or melena. He remains reasonably active and attends to activities of daily living.  He does not report any headaches, blurry vision, syncope or seizures. He does not report any fevers, chills, sweats or weight loss. His appetite remain excellent and have gained weight. He does not report any chest pain, palpitation, orthopnea or leg edema. He does not report any cough, wheezing or hemoptysis. Does not report any nausea, vomiting, abdominal pain. Does not report any hematochezia or melena. He does not report any frequency, urgency or hesitancy. He does report chronic neck and hip pain which is unchanged. Remaining review of systems unremarkable.    Medications: I have reviewed the patient's current medications.  Current Outpatient Prescriptions  Medication Sig Dispense Refill  . allopurinol (ZYLOPRIM) 300 MG tablet Take 300 mg by mouth daily.  2  . aspirin EC 81 MG tablet Take 81 mg by mouth daily.    [name] atorvastatin (LIPITOR) 40 MG tablet TAKE 1 TABLET(40 MG) BY MOUTH DAILY 6 PM 30 tablet 9  . calcium carbonate (TUMS EX) 750 MG chewable tablet Chew 6-7 tablets by mouth daily.     [name] erythromycin ophthalmic ointment Place 1 application into both eyes 2 (two) times daily as needed.  1  . FIBER SELECT GUMMIES PO Take 3-4 tablets by mouth daily.     [name] loperamide (IMODIUM) 2 MG capsule Take 6  mg by mouth daily.     . metoprolol tartrate (LOPRESSOR) 25 MG tablet TAKE 1 TABLET(25 MG) BY MOUTH TWICE DAILY 60 tablet 6  . Misc Natural Products (APPLE CIDER VINEGAR) TABS Take 1 tablet by mouth 2 (two) times daily.    . Multiple Vitamins-Minerals (MULTIVITAMIN WITH MINERALS) tablet Take 1 tablet by mouth daily.      . naproxen (NAPROSYN) 500 MG tablet TAKE 1 TABLET BY MOUTH UP TO TWICE DAILY WITH FOOD AS NEEDED FOR INFLAMMATION OR PAIN 60 tablet 0  . OVER THE COUNTER MEDICATION Apply 1 application topically daily as needed (For fungal infection.). Ciclopirox 8% Topical Solution    . pantoprazole (PROTONIX) 40 MG tablet Take 1 tablet (40 mg total) by mouth 2 (two) times daily. 60 tablet 3  . Saxagliptin-Metformin (KOMBIGLYZE XR) 2.07-998 MG TB24 Take 1 tablet by mouth daily.    . tadalafil (CIALIS) 5 MG tablet Take 5 mg by mouth daily. For interstitial cystitis.     No current facility-administered medications for this visit.      Allergies:  Allergies  Allergen Reactions  . Morphine Itching    Past Medical History, Surgical history, Social history, and Family History were reviewed and updated.  Physical Exam: Blood pressure (!) 177/76, pulse 84, temperature 98.7 F (37.1 C), temperature source Oral, resp. rate 20, height 6\' 2"  (1.88 m), weight 238 lb 1.6 oz (108 kg), SpO2 95 %. ECOG: 1 General appearance: alert and cooperative appeared without distress. Head: Normocephalic, without obvious abnormality Neck: no adenopathy Lymph nodes: Cervical,  supraclavicular, and axillary nodes normal. Heart:regular rate and rhythm, S1, S2 normal, no murmur, click, rub or gallop Lung:chest clear, no wheezing, rales, normal symmetric air entry. Abdomin: soft, non-tender, without masses or organomegaly no oral thrush or ulcers. EXT:no erythema, induration, or nodules   Lab Results: Lab Results  Component Value Date   WBC 4.5 08/31/2016   HGB 12.3 (L) 08/31/2016   HCT 35.8 (L) 08/31/2016    MCV 97.0 08/31/2016   PLT 136 (L) 08/31/2016     Chemistry      Component Value Date/Time   NA 141 08/31/2016 1414   K 3.9 08/31/2016 1414   CL 110 12/16/2015 1452   CO2 22 08/31/2016 1414   BUN 21.6 08/31/2016 1414   CREATININE 1.1 08/31/2016 1414      Component Value Date/Time   CALCIUM 9.5 08/31/2016 1414   ALKPHOS 60 08/31/2016 1414   AST 22 08/31/2016 1414   ALT 19 08/31/2016 1414   BILITOT 1.16 08/31/2016 1414         Impression and Plan:  83 year old gentleman with the following issues  1. Macrocytic anemia that have been now fluctuating periodically. He did have an element of GI bleed in the past and have been treated with oral iron periodically.   There is a repeat workup in June 2018 was reviewed today. His hemoglobin is close to normal range of 12.3 with no other evidence of hematological abnormalities. His protein studies, iron studies, B12 all within normal range.  At this time, I see no evidence to suggest a hematological disorder and his anemia is rather mild and asymptomatic. I do not recommend any further workup at this time given his near normal counts.  2. Thrombocytopenia: His platelet count continues to be near normal range and fluctuating above 100,000 since 2011. I recommended no intervention at this time other than periodic monitoring.  2. Follow-up: In happy to see him in the future as needed.    Zola Button, MD 6/20/20183:25 PM

## 2016-09-27 ENCOUNTER — Other Ambulatory Visit: Payer: Self-pay | Admitting: Cardiovascular Disease

## 2016-09-28 NOTE — Telephone Encounter (Signed)
Rx(s) sent to pharmacy electronically.  

## 2017-01-10 ENCOUNTER — Telehealth: Payer: Self-pay | Admitting: Cardiovascular Disease

## 2017-01-10 NOTE — Telephone Encounter (Signed)
Spoke with pt, for the last week patient has had SOB with exertion and rest. He also report a tightness in his chest that is constant and has been there for a week as well. He reports always having orthopnea that has not changed. He denies edema. Follow up scheduled with dr berry on Friday this week. Pt agreed with this plan, he will call prior to appt if symptoms change.

## 2017-01-10 NOTE — Telephone Encounter (Signed)
New message  Started feeling different about 1 week ago. Feels more symptoms when walking.  [place] Patient is requesting echo.   Pt c/o Shortness Of Breath: STAT if SOB developed within the last 24 hours or pt is noticeably SOB on the phone  1. Are you currently SOB (can you hear that pt is SOB on the phone)? "some"  2. How long have you been experiencing SOB? 1 week  3. Are you SOB when sitting or when up moving around? Both, mainly on exertion   4. Are you currently experiencing any other symptoms?  " a little chest discomfort"

## 2017-01-12 ENCOUNTER — Ambulatory Visit (INDEPENDENT_AMBULATORY_CARE_PROVIDER_SITE_OTHER): Payer: Medicare Other | Admitting: Cardiovascular Disease

## 2017-01-12 ENCOUNTER — Encounter: Payer: Self-pay | Admitting: Cardiovascular Disease

## 2017-01-12 VITALS — BP 132/77 | HR 71 | Ht 74.0 in | Wt 242.0 lb

## 2017-01-12 DIAGNOSIS — R0789 Other chest pain: Secondary | ICD-10-CM | POA: Diagnosis not present

## 2017-01-12 DIAGNOSIS — I10 Essential (primary) hypertension: Secondary | ICD-10-CM

## 2017-01-12 DIAGNOSIS — E785 Hyperlipidemia, unspecified: Secondary | ICD-10-CM | POA: Diagnosis not present

## 2017-01-12 DIAGNOSIS — R0602 Shortness of breath: Secondary | ICD-10-CM | POA: Diagnosis not present

## 2017-01-12 DIAGNOSIS — Z951 Presence of aortocoronary bypass graft: Secondary | ICD-10-CM

## 2017-01-12 NOTE — Patient Instructions (Signed)
Medication Instructions: Your physician recommends that you continue on your current medications as directed. Please refer to the Current Medication list given to you today.   Testing/Procedures: Your physician has requested that you have an exercise stress myoview. For further information please visit [email or web address]. Please follow instruction sheet, as given.  Your physician has requested that you have an echocardiogram. Echocardiography is a painless test that uses sound waves to create images of your heart. It provides your doctor with information about the size and shape of your heart and how well your heart's chambers and valves are working. This procedure takes approximately one hour. There are no restrictions for this procedure.  Follow-Up: Your physician wants you to follow-up in: 6 months with Dr. Gwenlyn Found. You will receive a reminder letter in the mail two months in advance. If you don't receive a letter, please call our office to schedule the follow-up appointment.  If you need a refill on your cardiac medications before your next appointment, please call your pharmacy.

## 2017-01-12 NOTE — Assessment & Plan Note (Signed)
History of dyslipidemia on statin therapy followed by his PCP 

## 2017-01-12 NOTE — Assessment & Plan Note (Signed)
History of essential hypertension blood pressure measured today at 132/77. He is on metoprolol. Continue current meds at current dosing

## 2017-01-12 NOTE — Progress Notes (Signed)
[birth year] Lucas Morales   [birth year]  [id number]  Primary Physician Lucas Pretty, MD Primary Cardiologist: Lucas Harp MD Lucas Morales, [place]  HPI:  Lucas Morales is a 83 y.o. male moderately overweight, married, Caucasian male, father of two, who I last saw in the office 07/06/16. He has a history of hypertension and hyperlipidemia. He was initially referred to me because of dyspnea. A CT angiogram of his heart performed three years prior showed moderate plaquing in all three coronary arteries with a calcium score of 800, and a 2D echo revealed an EF of 45 to 50%. Because of increasing symptoms of dyspnea on exertion, as well as chest pain radiating to his left upper extremity, a Myoview stress test was performed on January 04, 2012, which showed inferolateral ischemia. Ultimately he was catheterized by Dr. Glenetta Morales on October 24, revealing two vessel disease, and he underwent coronary artery bypass grafting x4 by Dr. Tharon Aquas Morales on October 31 with a LIMA to his LAD, vein to a distal LAD, obtuse marginal brach 3 and 4. His postoperative course was complicated by PAF, for which he was placed on Coumadin anticoagulation, Digoxin, and amiodarone. When I saw him back in January, he had a prolonged episode of chest pain. A Myoview stress test performed on January 21 was nonischemic. A MCOT showed no evidence of PAF, and as a result, I stopped his amiodarone, Coumadin, and Digoxin. He participated in cardiac rehab.  He saw Lucas Morales [facility] back in the office in March and July. He's been complaining of progressive dyspnea and chest pain. I did order a Myoview stress test and 2-D echocardiogram which revealed normal LV systolic function with severe concentric left hypertrophy. The Myoview showed subtle interval septal ischemia I do not think were significantly abnormal to warrant further evaluation at this time. Since I saw him in the office back in April 2017 he's done well. He  has had a failed left hip revision by Dr. Rush Morales and a 5/6 discectomy by Dr. Lorin Morales in November of last year. He saw Lucas Morales in the office 04/25/16 complaining of dyspnea. A Myoview stress test was ordered 05/03/16 which was entirely normal. He does admit to being under a lot of stress at home because of family issues which I suspect is contributory. Since I saw him back 07/06/16 he is terminal to recently when he's noticed increasing dyspnea on exertion and chest fullness. He scheduled to go to [place] and [place] for 5 weeks in the near future.    Current Meds  Medication Sig  . allopurinol (ZYLOPRIM) 300 MG tablet Take 300 mg by mouth daily.  [name] aspirin EC 81 MG tablet Take 81 mg by mouth daily.  [name] atorvastatin (LIPITOR) 40 MG tablet TAKE 1 TABLET BY MOUTH DAILY AT 6 PM  . calcium carbonate (TUMS EX) 750 MG chewable tablet Chew 6-7 tablets by mouth daily.   [name] FIBER SELECT GUMMIES PO Take 3-4 tablets by mouth daily.   [name] loperamide (IMODIUM) 2 MG capsule Take 6 mg by mouth daily.   . metoprolol succinate (TOPROL-XL) 25 MG 24 hr tablet Take 25 mg by mouth daily.  . metoprolol tartrate (LOPRESSOR) 25 MG tablet TAKE 1 TABLET(25 MG) BY MOUTH TWICE DAILY  . Misc Natural Products (APPLE CIDER VINEGAR) TABS Take 1 tablet by mouth 2 (two) times daily.  . Multiple Vitamins-Minerals (MULTIVITAMIN WITH MINERALS) tablet Take 1 tablet by mouth daily.    . naproxen (NAPROSYN)  500 MG tablet TAKE 1 TABLET BY MOUTH UP TO TWICE DAILY WITH FOOD AS NEEDED FOR INFLAMMATION OR PAIN  . OVER THE COUNTER MEDICATION Apply 1 application topically daily as needed (For fungal infection.). Ciclopirox 8% Topical Solution  . pantoprazole (PROTONIX) 40 MG tablet Take 1 tablet (40 mg total) by mouth 2 (two) times daily.  . tadalafil (CIALIS) 5 MG tablet Take 5 mg by mouth daily. For interstitial cystitis.     Allergies  Allergen Reactions  . Morphine Itching    Social History   Social History  . Marital status:  Married    Spouse name: N/A  . Number of children: 2  . Years of education: N/A   Occupational History  . Retired Retired   Social History Main Topics  . Smoking status: Never Smoker  . Smokeless tobacco: Never Used  . Alcohol use No     Comment: 09/01/2014 "glass of wine a few times/yr; if that"  . Drug use: No  . Sexual activity: Yes   Other Topics Concern  . Not on file   Social History Narrative  . No narrative on file     Review of Systems: General: negative for chills, fever, night sweats or weight changes.  Cardiovascular: negative for chest pain, dyspnea on exertion, edema, orthopnea, palpitations, paroxysmal nocturnal dyspnea or shortness of breath Dermatological: negative for rash Respiratory: negative for cough or wheezing Urologic: negative for hematuria Abdominal: negative for nausea, vomiting, diarrhea, bright red blood per rectum, melena, or hematemesis Neurologic: negative for visual changes, syncope, or dizziness All other systems reviewed and are otherwise negative except as noted above.    Blood pressure 132/77, pulse 71, height 6\' 2"  (1.88 m), weight 242 lb (109.8 kg).  General appearance: alert and no distress Neck: no adenopathy, no carotid bruit, no JVD, supple, symmetrical, trachea midline and thyroid not enlarged, symmetric, no tenderness/mass/nodules Lungs: clear to auscultation bilaterally Heart: regular rate and rhythm, S1, S2 normal, no murmur, click, rub or gallop Extremities: extremities normal, atraumatic, no cyanosis or edema Pulses: 2+ and symmetric Skin: Skin color, texture, turgor normal. No rashes or lesions Neurologic: Alert and oriented X 3, normal strength and tone. Normal symmetric reflexes. Normal coordination and gait  EKG normal sinus rhythm at 71 with borderline LVH voltage. I personally reviewed this EKG.  ASSESSMENT AND PLAN:   Hypertension History of essential hypertension blood pressure measured today at 132/77. He is  on metoprolol. Continue current meds at current dosing  Dyslipidemia History of dyslipidemia on statin therapy followed by his PCP  Hx of CABG History of coronary artery bypass grafting times 12/22/2011 by Dr. Prescott Gum t with a LIMA to his LAD, vein to the distal LAD, obtuse marginal branches 2 and 3. His last Myoview stress test performed 05/03/16 was low risk. In the last several weeks has noticed increasing dyspnea on exertion and chest fullness. Scheduled to go to [place] for 5 weeks in the upper coming weeks. Based on this, I'm going to repeat an exercise Myoview stress test and 2-D echocardiogram.      Lucas Harp MD [facility], [facility] 01/12/2017 11:26 AM

## 2017-01-12 NOTE — Assessment & Plan Note (Signed)
History of coronary artery bypass grafting times 12/22/2011 by Dr. Prescott Gum t with a LIMA to his LAD, vein to the distal LAD, obtuse marginal branches 2 and 3. His last Myoview stress test performed 05/03/16 was low risk. In the last several weeks has noticed increasing dyspnea on exertion and chest fullness. Scheduled to go to [place] for 5 weeks in the upper coming weeks. Based on this, I'm going to repeat an exercise Myoview stress test and 2-D echocardiogram.

## 2017-01-16 ENCOUNTER — Other Ambulatory Visit: Payer: Self-pay

## 2017-01-16 ENCOUNTER — Telehealth (HOSPITAL_COMMUNITY): Payer: Self-pay

## 2017-01-16 ENCOUNTER — Ambulatory Visit (HOSPITAL_COMMUNITY): Payer: Medicare Other | Attending: Cardiology

## 2017-01-16 DIAGNOSIS — I051 Rheumatic mitral insufficiency: Secondary | ICD-10-CM | POA: Insufficient documentation

## 2017-01-16 DIAGNOSIS — I420 Dilated cardiomyopathy: Secondary | ICD-10-CM | POA: Diagnosis not present

## 2017-01-16 DIAGNOSIS — I5030 Unspecified diastolic (congestive) heart failure: Secondary | ICD-10-CM | POA: Insufficient documentation

## 2017-01-16 DIAGNOSIS — R0789 Other chest pain: Secondary | ICD-10-CM | POA: Diagnosis not present

## 2017-01-16 DIAGNOSIS — R0602 Shortness of breath: Secondary | ICD-10-CM | POA: Diagnosis present

## 2017-01-16 NOTE — Telephone Encounter (Signed)
Encounter complete. 

## 2017-01-17 ENCOUNTER — Ambulatory Visit (HOSPITAL_COMMUNITY)
Admission: RE | Admit: 2017-01-17 | Discharge: 2017-01-17 | Disposition: A | Discharge status: Home / Self Care | Payer: Medicare Other | Source: Ambulatory Visit | Attending: Cardiology | Admitting: Cardiology

## 2017-01-17 DIAGNOSIS — E663 Overweight: Secondary | ICD-10-CM | POA: Diagnosis not present

## 2017-01-17 DIAGNOSIS — R001 Bradycardia, unspecified: Secondary | ICD-10-CM | POA: Diagnosis not present

## 2017-01-17 DIAGNOSIS — I10 Essential (primary) hypertension: Secondary | ICD-10-CM | POA: Insufficient documentation

## 2017-01-17 DIAGNOSIS — J45909 Unspecified asthma, uncomplicated: Secondary | ICD-10-CM | POA: Diagnosis not present

## 2017-01-17 DIAGNOSIS — R5383 Other fatigue: Secondary | ICD-10-CM | POA: Diagnosis not present

## 2017-01-17 DIAGNOSIS — Z951 Presence of aortocoronary bypass graft: Secondary | ICD-10-CM | POA: Insufficient documentation

## 2017-01-17 DIAGNOSIS — R0609 Other forms of dyspnea: Secondary | ICD-10-CM | POA: Insufficient documentation

## 2017-01-17 DIAGNOSIS — R0789 Other chest pain: Secondary | ICD-10-CM | POA: Diagnosis not present

## 2017-01-17 DIAGNOSIS — Z6831 Body mass index (BMI) 31.0-31.9, adult: Secondary | ICD-10-CM | POA: Insufficient documentation

## 2017-01-17 DIAGNOSIS — I2510 Atherosclerotic heart disease of native coronary artery without angina pectoris: Secondary | ICD-10-CM | POA: Insufficient documentation

## 2017-01-17 DIAGNOSIS — I4891 Unspecified atrial fibrillation: Secondary | ICD-10-CM | POA: Diagnosis not present

## 2017-01-17 DIAGNOSIS — R42 Dizziness and giddiness: Secondary | ICD-10-CM | POA: Diagnosis not present

## 2017-01-17 DIAGNOSIS — I779 Disorder of arteries and arterioles, unspecified: Secondary | ICD-10-CM | POA: Diagnosis not present

## 2017-01-17 LAB — MYOCARDIAL PERFUSION IMAGING
CHL CUP NUCLEAR SRS: 4
CHL CUP RESTING HR STRESS: 72 {beats}/min
CHL RATE OF PERCEIVED EXERTION: 19
CSEPEDS: 45 s
CSEPHR: 102 %
CSEPPHR: 144 {beats}/min
Estimated workload: 5.3 METS
Exercise duration (min): 4 min
LV sys vol: 76 mL
LVDIAVOL: 143 mL (ref 62–150)
MPHR: 140 {beats}/min
SDS: 1
SSS: 5
TID: 1.16

## 2017-01-17 MED ORDER — TECHNETIUM TC 99M TETROFOSMIN IV KIT
9.8000 | PACK | Freq: Once | INTRAVENOUS | Status: AC | PRN
  Administered 2017-01-17: 9.8 via INTRAVENOUS
  Filled 2017-01-17: qty 10

## 2017-01-17 MED ORDER — TECHNETIUM TC 99M TETROFOSMIN IV KIT
31.5000 | PACK | Freq: Once | INTRAVENOUS | Status: AC | PRN
  Administered 2017-01-17: 31.5 via INTRAVENOUS
  Filled 2017-01-17: qty 32

## 2017-01-19 ENCOUNTER — Telehealth: Payer: Self-pay | Admitting: Cardiovascular Disease

## 2017-01-19 NOTE — Telephone Encounter (Signed)
Notes recorded by Lorretta Harp, MD on 01/18/2017 at 9:08 AM EDT Low risk Myoview with no evidence of reversible ischemia  Still waiting for Dr Gwenlyn Found to read ECHO  Pt notified he expresses thanks and would like results today he is leaving the country Sunday

## 2017-01-19 NOTE — Telephone Encounter (Signed)
Dr. Gwenlyn Found,   This pt echo is in your basket. Would you mind reading his result today? He is leaving the country on Sunday and is asking for his results before then.  Thanks!

## 2017-01-19 NOTE — Telephone Encounter (Signed)
Lucas Morales is calling to get his results of his Echo and Stress Test he had on Last Week Tuesday and Wednesday . Leaving for [place] on Sunday and will  Be gone for 5wks . Please call

## 2017-01-25 ENCOUNTER — Other Ambulatory Visit: Payer: Self-pay | Admitting: Cardiovascular Disease

## 2017-01-25 DIAGNOSIS — I480 Paroxysmal atrial fibrillation: Secondary | ICD-10-CM

## 2017-01-25 NOTE — Telephone Encounter (Signed)
LVEF was nl. MV was low risk to my eye. OK to travel

## 2017-05-22 ENCOUNTER — Telehealth: Payer: Self-pay | Admitting: Cardiovascular Disease

## 2017-05-22 NOTE — Telephone Encounter (Signed)
New message     Pt c/o of Chest Pain: STAT if CP now or developed within 24 hours  1. Are you having CP right now? YES  2. Are you experiencing any other symptoms (ex. SOB, nausea, vomiting, sweating)? SOB  3. How long have you been experiencing CP? 1 week  4. Is your CP continuous or coming and going? Contiuous  5. Have you taken Nitroglycerin? No ?

## 2017-05-22 NOTE — Telephone Encounter (Signed)
Left message to call back-advised of the on call option if needed.

## 2017-05-24 NOTE — Telephone Encounter (Signed)
Left message for pt to call.

## 2017-05-25 ENCOUNTER — Ambulatory Visit: Payer: Medicare Other | Admitting: Cardiology

## 2017-05-25 ENCOUNTER — Encounter: Payer: Self-pay | Admitting: Cardiology

## 2017-05-25 VITALS — BP 110/62 | HR 81 | Ht 74.0 in | Wt 235.2 lb

## 2017-05-25 DIAGNOSIS — R079 Chest pain, unspecified: Secondary | ICD-10-CM

## 2017-05-25 DIAGNOSIS — N3010 Interstitial cystitis (chronic) without hematuria: Secondary | ICD-10-CM

## 2017-05-25 DIAGNOSIS — I10 Essential (primary) hypertension: Secondary | ICD-10-CM | POA: Diagnosis not present

## 2017-05-25 DIAGNOSIS — I480 Paroxysmal atrial fibrillation: Secondary | ICD-10-CM

## 2017-05-25 DIAGNOSIS — Z951 Presence of aortocoronary bypass graft: Secondary | ICD-10-CM

## 2017-05-25 DIAGNOSIS — R9431 Abnormal electrocardiogram [ECG] [EKG]: Secondary | ICD-10-CM | POA: Diagnosis not present

## 2017-05-25 DIAGNOSIS — E785 Hyperlipidemia, unspecified: Secondary | ICD-10-CM | POA: Diagnosis not present

## 2017-05-25 LAB — CBC WITH DIFFERENTIAL/PLATELET
Basophils Absolute: 0 x10E3/uL (ref 0.0–0.2)
Basos: 1 %
EOS (ABSOLUTE): 0.1 x10E3/uL (ref 0.0–0.4)
Eos: 2 %
Hematocrit: 37.2 % — ABNORMAL LOW (ref 37.5–51.0)
Hemoglobin: 13 g/dL (ref 13.0–17.7)
Immature Grans (Abs): 0 x10E3/uL (ref 0.0–0.1)
Immature Granulocytes: 0 %
Lymphocytes Absolute: 1.2 x10E3/uL (ref 0.7–3.1)
Lymphs: 26 %
MCH: 32.9 pg (ref 26.6–33.0)
MCHC: 34.9 g/dL (ref 31.5–35.7)
MCV: 94 fL (ref 79–97)
Monocytes Absolute: 0.4 x10E3/uL (ref 0.1–0.9)
Monocytes: 9 %
Neutrophils Absolute: 2.8 x10E3/uL (ref 1.4–7.0)
Neutrophils: 62 %
Platelets: 163 x10E3/uL (ref 150–379)
RBC: 3.95 x10E6/uL — ABNORMAL LOW (ref 4.14–5.80)
RDW: 15.9 % — ABNORMAL HIGH (ref 12.3–15.4)
WBC: 4.5 x10E3/uL (ref 3.4–10.8)

## 2017-05-25 LAB — BASIC METABOLIC PANEL
BUN/Creatinine Ratio: 20 (ref 10–24)
BUN: 23 mg/dL (ref 8–27)
CO2: 20 mmol/L (ref 20–29)
Calcium: 9.3 mg/dL (ref 8.6–10.2)
Chloride: 107 mmol/L — ABNORMAL HIGH (ref 96–106)
Creatinine, Ser: 1.13 mg/dL (ref 0.76–1.27)
GFR calc Af Amer: 70 mL/min/{1.73_m2} (ref >59)
GFR calc non Af Amer: 61 mL/min/{1.73_m2} (ref >59)
Glucose: 125 mg/dL — ABNORMAL HIGH (ref 65–99)
Potassium: 4.2 mmol/L (ref 3.5–5.2)
Sodium: 142 mmol/L (ref 134–144)

## 2017-05-25 LAB — PT AND PTT
INR: 1.1 (ref 0.8–1.2)
Prothrombin Time: 11.4 s (ref 9.1–12.0)
aPTT: 28 s (ref 24–33)

## 2017-05-25 MED ORDER — AMLODIPINE BESYLATE 2.5 MG PO TABS: 2.5000 mg | ORAL_TABLET | Freq: Every day | ORAL | 3 refills | Status: DC

## 2017-05-25 NOTE — Assessment & Plan Note (Signed)
Controlled.  

## 2017-05-25 NOTE — Progress Notes (Signed)
07/25/3218 Lucas Morales   25/06[birth year]04  [id number]  Primary Physician Deland Pretty, MD Primary Cardiologist: Dr Gwenlyn Found  HPI:  84 y/o male with a history of CAD, s/p CABG x 4 with an LIMA to his LAD, vein to a distal LAD, obtuse marginal brach 3 and 4. He has had four low risk Myoview since for chest pain- 1/14, 2/18, and 10/18. Echo in Oct 2018 showed normal LVF with mild LVH. He is seen in the office today with complaints of SSCP-"tightness" for the past 10-14 days. He was previously doing well. His symptoms are vague and not necessarily exertional. No associated SOB, diaphoresis, or radiation. He can't take nitrates as he is on Cialis for chronic cystitis.     Current Outpatient Medications  Medication Sig Dispense Refill  . allopurinol (ZYLOPRIM) 300 MG tablet Take 300 mg by mouth daily.  2  . aspirin EC 81 MG tablet Take 81 mg by mouth daily.    [name] atorvastatin (LIPITOR) 40 MG tablet TAKE 1 TABLET BY MOUTH DAILY AT 6 PM 30 tablet 7  . calcium carbonate (TUMS EX) 750 MG chewable tablet Chew 6-7 tablets by mouth as directed.     [name] FIBER SELECT GUMMIES PO Take 3-4 tablets by mouth as directed.     . loperamide (IMODIUM) 2 MG capsule Take 6 mg by mouth daily.     . metoprolol succinate (TOPROL-XL) 25 MG 24 hr tablet Take 25 mg by mouth daily.  12  . Misc Natural Products (APPLE CIDER VINEGAR) TABS Take 1 tablet by mouth 2 (two) times daily.    . Multiple Vitamins-Minerals (MULTIVITAMIN WITH MINERALS) tablet Take 1 tablet by mouth daily.      . naproxen (NAPROSYN) 500 MG tablet TAKE 1 TABLET BY MOUTH UP TO TWICE DAILY WITH FOOD AS NEEDED FOR INFLAMMATION OR PAIN 60 tablet 0  . OVER THE COUNTER MEDICATION Apply 1 application topically daily as needed (For fungal infection.). Ciclopirox 8% Topical Solution    . pantoprazole (PROTONIX) 40 MG tablet Take 1 tablet (40 mg total) by mouth 2 (two) times daily. 60 tablet 3  . tadalafil (CIALIS) 5 MG tablet Take 5 mg by mouth daily. For  interstitial cystitis.    [name] amLODipine (NORVASC) 2.5 MG tablet Take 1 tablet (2.5 mg total) by mouth daily. 180 tablet 3   No current facility-administered medications for this visit.     Allergies  Allergen Reactions  . Morphine Itching    Past Medical History:  Diagnosis Date  . Adenomatous colon polyp   . Alpha-1-antitrypsin deficiency ([place])   . Alpha-1-antitrypsin deficiency carrier ([place])   . Anemia   . Anxiety   . Arthritis    "knees; bad in my back" (09/01/2014)  . Bruises easily   . Chronic bronchitis ([place])    "get it pretty much q yr" (09/01/2014)  . Chronic lower back pain   . Coronary artery disease   . Depression    "related to health" (09/01/2014)  . Diarrhea    has had a part of colon removed  . Diverticulitis   . Diverticulosis   . Dyspnea on exertion   . Dysrhythmia    bradycardia in the 30's (01/11/2012).. Afib briefly after CABG  . Esophageal dysmotility   . Esophageal stricture   . Gastritis   . GERD (gastroesophageal reflux disease)    takes Pantoprazole daily  . Gout   . Hemorrhoids   . Hiatal hernia   . History of  colon polyps   . Hyperlipidemia    doesn't require meds  . Hypertension    takes Losartan daily; current not taking   . IC (interstitial cystitis)   . Insomnia    takes Nortrypyline nightly  . Joint pain   . Joint swelling   . Macular hole of both eyes   . Nocturia   . OSA (obstructive sleep apnea)    "mild; tried CPAP; couldn't tolerate" (09/01/2014)  . Paroxysmal atrial fibrillation ([facility])   . Restless leg syndrome   . Sciatic nerve pain   . Type II diabetes mellitus ([place])    "borderline"  . Urinary frequency     Social History   Socioeconomic History  . Marital status: Married    Spouse name: Not on file  . Number of children: 2  . Years of education: Not on file  . Highest education level: Not on file  Social Needs  . Financial resource strain: Not on file  . Food insecurity - worry: Not on file  . Food  insecurity - inability: Not on file  . Transportation needs - medical: Not on file  . Transportation needs - non-medical: Not on file  Occupational History  . Occupation: Retired    Fish farm manager: RETIRED  Tobacco Use  . Smoking status: Never Smoker  . Smokeless tobacco: Never Used  Substance and Sexual Activity  . Alcohol use: No    Comment: 09/01/2014 "glass of wine a few times/yr; if that"  . Drug use: No  . Sexual activity: Yes  Other Topics Concern  . Not on file  Social History Narrative  . Not on file     Family History  Problem Relation Age of Onset  . Emphysema Mother   . Kidney disease Father   . Emphysema Maternal Grandfather   . Colon cancer Neg Hx      Review of Systems: General: negative for chills, fever, night sweats or weight changes.  Cardiovascular: negative for chest pain, dyspnea on exertion, edema, orthopnea, palpitations, paroxysmal nocturnal dyspnea or shortness of breath Dermatological: negative for rash Respiratory: negative for cough or wheezing Urologic: negative for hematuria Abdominal: negative for nausea, vomiting, diarrhea, bright red blood per rectum, melena, or hematemesis Neurologic: negative for visual changes, syncope, or dizziness All other systems reviewed and are otherwise negative except as noted above.    Blood pressure 110/62, pulse 81, height 6\' 2"  (1.88 m), weight 235 lb 3.2 oz (106.7 kg).  General appearance: alert, cooperative and no distress Neck: no carotid bruit and no JVD Lungs: clear to auscultation bilaterally Heart: regular rate and rhythm Abdomen: soft, non-tender; bowel sounds normal; no masses,  no organomegaly and mid line surgical scar Extremities: extremities normal, atraumatic, no cyanosis or edema Pulses: 2+ and symmetric Skin: Skin color, texture, turgor normal. No rashes or lesions Neurologic: Grossly normal  EKG NSR, LVH, Q-3 and AVF  ASSESSMENT AND PLAN:   Chest pain with moderate risk of acute  coronary syndrome Recurrent chest pain s/p CABG 2013- multiple low risk functional studies  Abnormal EKG He does have Q waves not seen on prior EKG but this may be secondary to LVH, lead placement  Dyslipidemia He admits he stopped his statin secondary to myalgia with only minimal relief  Hx of CABG CABG x 22 Dec 2011. Pt was asymptomatic pre-CABG. He had CAD on a CT scan, subsequent Myoview was abnormal leading to cath and then CABG.  Myoview low risk Jan 2015  PAF (paroxysmal atrial fibrillation) (  [place]) PAF post CABG 2013. Amiodarone and Coumadin stopped Jan 2015  Essential hypertension Controlled  Chronic interstitial cystitis On Cialis- he can't take nitrates   PLAN  Discussed with Dr Gwenlyn Found- will plan diagnostic cath-The patient understands that risks included but are not limited to stroke (1 in 1000), death (1 in 35), kidney failure [usually temporary] (1 in 500), bleeding (1 in 200), allergic reaction [possibly serious] (1 in 200).  The patient understands and agrees to proceed.  Add low dose Norvasc for suspected angina.   Kerin Ransom PA-C 05/25/2017 10:05 AM

## 2017-05-25 NOTE — Assessment & Plan Note (Signed)
He admits he stopped his statin secondary to myalgia with only minimal relief

## 2017-05-25 NOTE — Assessment & Plan Note (Signed)
PAF post CABG 2013. Amiodarone and Coumadin stopped Jan 2015

## 2017-05-25 NOTE — Patient Instructions (Addendum)
[facility] [address] [place] [address] Dept: [phone] Loc: [phone]  NAZARIO RUSSOM  [birth year]  You are scheduled for a Cardiac Catheterization on Monday, March 11 with Dr. Quay Burow.  1. Please arrive at the [facility] (Main Entrance A) at [facility]: [place], [place] [address] at 7:00 AM (two hours before your procedure to ensure your preparation). Free valet parking service is available.   Special note: Every effort is made to have your procedure done on time. Please understand that emergencies sometimes delay scheduled procedures.  2. Diet: Do not eat or drink anything after midnight prior to your procedure except sips of water to take medications.  3. Labs: Complete labs today in office  4. Medication instructions in preparation for your procedure:    Current Outpatient Medications (Cardiovascular):  .  atorvastatin (LIPITOR) 40 MG tablet, TAKE 1 TABLET BY MOUTH DAILY AT 6 PM .  metoprolol succinate (TOPROL-XL) 25 MG 24 hr tablet, Take 25 mg by mouth daily. .  tadalafil (CIALIS) 5 MG tablet, Take 5 mg by mouth daily. For interstitial cystitis.   Current Outpatient Medications (Analgesics):  .  allopurinol (ZYLOPRIM) 300 MG tablet, Take 300 mg by mouth daily. [name]  aspirin EC 81 MG tablet, Take 81 mg by mouth daily. .  naproxen (NAPROSYN) 500 MG tablet, TAKE 1 TABLET BY MOUTH UP TO TWICE DAILY WITH FOOD AS NEEDED FOR INFLAMMATION OR PAIN   Current Outpatient Medications (Other):  .  calcium carbonate (TUMS EX) 750 MG chewable tablet, Chew 6-7 tablets by mouth as directed.  [name]  FIBER SELECT GUMMIES PO, Take 3-4 tablets by mouth as directed.  .  loperamide (IMODIUM) 2 MG capsule, Take 6 mg by mouth daily.  .  Misc Natural Products (APPLE CIDER VINEGAR) TABS, Take 1 tablet by mouth 2 (two) times daily. .  Multiple Vitamins-Minerals  (MULTIVITAMIN WITH MINERALS) tablet, Take 1 tablet by mouth daily.   [name]  OVER THE COUNTER MEDICATION, Apply 1 application topically daily as needed (For fungal infection.). Ciclopirox 8% Topical Solution .  pantoprazole (PROTONIX) 40 MG tablet, Take 1 tablet (40 mg total) by mouth 2 (two) times daily. *For reference purposes while preparing patient instructions.   Delete this med list prior to printing instructions for patient  5. Plan for one night stay--bring personal belongings. 6. Bring a current list of your medications and current insurance cards. 7. You MUST have a responsible person to drive you home. 8. Someone MUST be with you the first 24 hours after you arrive home or your discharge will be delayed. 9. Please wear clothes that are easy to get on and off and wear slip-on shoes.  Thank you for allowing [place] to care for you!   -- [facility]   Medication Instructions:  START Norvasc 2.5mg  take 1 tablet once a day  Labwork: Your physician recommends that you return for lab work in: [facility], BMP, PTT/INF  Testing/Procedures: Your physician has requested that you have a cardiac catheterization. Cardiac catheterization is used to diagnose and/or treat various heart conditions. Doctors may recommend this procedure for a number of different reasons. The most common reason is to evaluate chest pain. Chest pain can be a symptom of coronary artery disease (CAD), and cardiac catheterization can show whether plaque is narrowing or blocking your heart's arteries. This procedure is also used to evaluate the valves, as well  as measure the blood flow and oxygen levels in different parts of your heart. For further information please visit [email or web address]. Please follow instruction sheet, as given.   Follow-Up: INSTRUCTIONS ON FOLLOW UP WILL BE GIVEN AT [place]   Any Other Special Instructions Will Be Listed Below (If Applicable). If you need a refill on  your cardiac medications before your next appointment, please call your pharmacy.

## 2017-05-25 NOTE — Assessment & Plan Note (Signed)
CABG x 22 Dec 2011. Pt was asymptomatic pre-CABG. He had CAD on a CT scan, subsequent Myoview was abnormal leading to cath and then CABG.  Myoview low risk Jan 2015

## 2017-05-25 NOTE — Addendum Note (Signed)
Addended by: Zebedee Iba on: 05/25/2017 11:24 AM   Modules accepted: Orders

## 2017-05-25 NOTE — Assessment & Plan Note (Signed)
On Cialis- he can't take nitrates

## 2017-05-25 NOTE — Assessment & Plan Note (Signed)
Recurrent chest pain s/p CABG 2013- multiple low risk functional studies

## 2017-05-25 NOTE — Assessment & Plan Note (Signed)
He does have Q waves not seen on prior EKG but this may be secondary to LVH, lead placement

## 2017-05-28 ENCOUNTER — Encounter (HOSPITAL_COMMUNITY)
Admission: RE | Disposition: A | Discharge status: Home / Self Care | Payer: Self-pay | Source: Ambulatory Visit | Attending: Interventional Cardiology

## 2017-05-28 ENCOUNTER — Ambulatory Visit (HOSPITAL_COMMUNITY)
Admission: RE | Admit: 2017-05-28 | Discharge: 2017-05-28 | Disposition: A | Discharge status: Home / Self Care | Payer: Medicare Other | Source: Ambulatory Visit | Attending: Interventional Cardiology | Admitting: Interventional Cardiology

## 2017-05-28 DIAGNOSIS — I25810 Atherosclerosis of coronary artery bypass graft(s) without angina pectoris: Secondary | ICD-10-CM

## 2017-05-28 DIAGNOSIS — D696 Thrombocytopenia, unspecified: Secondary | ICD-10-CM | POA: Diagnosis present

## 2017-05-28 DIAGNOSIS — E785 Hyperlipidemia, unspecified: Secondary | ICD-10-CM | POA: Diagnosis present

## 2017-05-28 DIAGNOSIS — R0602 Shortness of breath: Secondary | ICD-10-CM | POA: Diagnosis present

## 2017-05-28 DIAGNOSIS — I25719 Atherosclerosis of autologous vein coronary artery bypass graft(s) with unspecified angina pectoris: Secondary | ICD-10-CM | POA: Diagnosis present

## 2017-05-28 DIAGNOSIS — I2582 Chronic total occlusion of coronary artery: Secondary | ICD-10-CM | POA: Insufficient documentation

## 2017-05-28 DIAGNOSIS — Z951 Presence of aortocoronary bypass graft: Secondary | ICD-10-CM

## 2017-05-28 DIAGNOSIS — I480 Paroxysmal atrial fibrillation: Secondary | ICD-10-CM | POA: Diagnosis present

## 2017-05-28 HISTORY — PX: LEFT HEART CATH AND CORS/GRAFTS ANGIOGRAPHY: CATH118250

## 2017-05-28 LAB — GLUCOSE, CAPILLARY: Glucose-Capillary: 125 mg/dL — ABNORMAL HIGH (ref 65–99)

## 2017-05-28 SURGERY — LEFT HEART CATH AND CORS/GRAFTS ANGIOGRAPHY
Anesthesia: LOCAL

## 2017-05-28 MED ORDER — SODIUM CHLORIDE 0.9% FLUSH: 3.0000 mL | INTRAVENOUS | Status: DC | PRN

## 2017-05-28 MED ORDER — MIDAZOLAM HCL 2 MG/2ML IJ SOLN
INTRAMUSCULAR | Status: DC | PRN
  Administered 2017-05-28 (×2): 1 mg via INTRAVENOUS

## 2017-05-28 MED ORDER — SODIUM CHLORIDE 0.9 % IV SOLN: INTRAVENOUS | Status: DC

## 2017-05-28 MED ORDER — SODIUM CHLORIDE 0.9 % WEIGHT BASED INFUSION
3.0000 mL/kg/h | INTRAVENOUS | Status: DC
  Administered 2017-05-28: 3 mL/kg/h via INTRAVENOUS

## 2017-05-28 MED ORDER — IOPAMIDOL (ISOVUE-370) INJECTION 76%
INTRAVENOUS | Status: AC
  Filled 2017-05-28: qty 125

## 2017-05-28 MED ORDER — HEPARIN (PORCINE) IN NACL 2-0.9 UNIT/ML-% IJ SOLN
INTRAMUSCULAR | Status: AC | PRN
  Administered 2017-05-28 (×2): 500 mL

## 2017-05-28 MED ORDER — ASPIRIN 81 MG PO CHEW
CHEWABLE_TABLET | ORAL | Status: AC
  Administered 2017-05-28: 81 mg via ORAL
  Filled 2017-05-28: qty 1

## 2017-05-28 MED ORDER — SODIUM CHLORIDE 0.9% FLUSH: 3.0000 mL | Freq: Two times a day (BID) | INTRAVENOUS | Status: DC

## 2017-05-28 MED ORDER — SODIUM CHLORIDE 0.9 % IV SOLN: 250.0000 mL | INTRAVENOUS | Status: DC | PRN

## 2017-05-28 MED ORDER — ONDANSETRON HCL 4 MG/2ML IJ SOLN: 4.0000 mg | Freq: Four times a day (QID) | INTRAMUSCULAR | Status: DC | PRN

## 2017-05-28 MED ORDER — VERAPAMIL HCL 2.5 MG/ML IV SOLN
INTRAVENOUS | Status: DC | PRN
  Administered 2017-05-28: 10 mL via INTRA_ARTERIAL

## 2017-05-28 MED ORDER — SODIUM CHLORIDE 0.9% FLUSH
3.0000 mL | INTRAVENOUS | Status: DC | PRN
Start: 2017-05-28 — End: 2017-05-28

## 2017-05-28 MED ORDER — ASPIRIN 81 MG PO CHEW: 81.0000 mg | CHEWABLE_TABLET | ORAL | Status: DC

## 2017-05-28 MED ORDER — FENTANYL CITRATE (PF) 100 MCG/2ML IJ SOLN
INTRAMUSCULAR | Status: AC
  Filled 2017-05-28: qty 2

## 2017-05-28 MED ORDER — HEPARIN (PORCINE) IN NACL 2-0.9 UNIT/ML-% IJ SOLN
INTRAMUSCULAR | Status: AC
  Filled 2017-05-28: qty 1000

## 2017-05-28 MED ORDER — IOPAMIDOL (ISOVUE-370) INJECTION 76%
INTRAVENOUS | Status: DC | PRN
  Administered 2017-05-28: 165 mL via INTRAVENOUS

## 2017-05-28 MED ORDER — LIDOCAINE HCL (PF) 1 % IJ SOLN
INTRAMUSCULAR | Status: DC | PRN
  Administered 2017-05-28: 2 mL

## 2017-05-28 MED ORDER — LIDOCAINE HCL (PF) 1 % IJ SOLN
INTRAMUSCULAR | Status: AC
  Filled 2017-05-28: qty 30

## 2017-05-28 MED ORDER — FENTANYL CITRATE (PF) 100 MCG/2ML IJ SOLN
INTRAMUSCULAR | Status: DC | PRN
  Administered 2017-05-28: 50 ug via INTRAVENOUS

## 2017-05-28 MED ORDER — HEPARIN SODIUM (PORCINE) 1000 UNIT/ML IJ SOLN
INTRAMUSCULAR | Status: DC | PRN
  Administered 2017-05-28: 5000 [IU] via INTRAVENOUS

## 2017-05-28 MED ORDER — SODIUM CHLORIDE 0.9 % WEIGHT BASED INFUSION: 1.0000 mL/kg/h | INTRAVENOUS | Status: DC

## 2017-05-28 MED ORDER — VERAPAMIL HCL 2.5 MG/ML IV SOLN
INTRAVENOUS | Status: AC
  Filled 2017-05-28: qty 2

## 2017-05-28 MED ORDER — HEPARIN SODIUM (PORCINE) 1000 UNIT/ML IJ SOLN
INTRAMUSCULAR | Status: AC
  Filled 2017-05-28: qty 1

## 2017-05-28 MED ORDER — ACETAMINOPHEN 325 MG PO TABS: 650.0000 mg | ORAL_TABLET | ORAL | Status: DC | PRN

## 2017-05-28 MED ORDER — IOPAMIDOL (ISOVUE-370) INJECTION 76%
INTRAVENOUS | Status: AC
  Filled 2017-05-28: qty 100

## 2017-05-28 MED ORDER — ASPIRIN 81 MG PO CHEW
81.0000 mg | CHEWABLE_TABLET | ORAL | Status: AC
  Administered 2017-05-28: 81 mg via ORAL

## 2017-05-28 MED ORDER — MIDAZOLAM HCL 2 MG/2ML IJ SOLN
INTRAMUSCULAR | Status: AC
  Filled 2017-05-28: qty 2

## 2017-05-28 SURGICAL SUPPLY — 16 items
BAND ZEPHYR COMPRESS 30 LONG (HEMOSTASIS) ×2 IMPLANT
CATH INFINITI 5FR AL1 (CATHETERS) ×2 IMPLANT
CATH INFINITI 5FR MULTPACK ANG (CATHETERS) ×2 IMPLANT
CATH LAUNCHER 5F RADR (CATHETERS) ×1 IMPLANT
CATH PRO-FLO 6F MPA-2 (CATHETERS) ×2 IMPLANT
CATHETER LAUNCHER 5F RADR (CATHETERS) ×2
COVER PRB 48X5XTLSCP FOLD TPE (BAG) ×1 IMPLANT
COVER PROBE 5X48 (BAG) ×1
GLIDESHEATH SLEND A-KIT 6F 22G (SHEATH) ×2 IMPLANT
GUIDEWIRE INQWIRE 1.5J.035X260 (WIRE) ×1 IMPLANT
INQWIRE 1.5J .035X260CM (WIRE) ×2
KIT HEART LEFT (KITS) ×2 IMPLANT
PACK CARDIAC CATHETERIZATION (CUSTOM PROCEDURE TRAY) ×2 IMPLANT
TRANSDUCER W/STOPCOCK (MISCELLANEOUS) ×2 IMPLANT
TUBING CIL FLEX 10 FLL-RA (TUBING) ×2 IMPLANT
WIRE HI TORQ VERSACORE-J 145CM (WIRE) ×2 IMPLANT

## 2017-05-28 NOTE — Addendum Note (Signed)
Addended by: Zebedee Iba on: 05/28/2017 08:44 AM   Modules accepted: Orders

## 2017-05-28 NOTE — Discharge Instructions (Signed)

## 2017-05-28 NOTE — H&P (Signed)
Cath Lab Visit (complete for each Cath Lab visit)  Clinical Evaluation Leading to the Procedure:   ACS: No.  Non-ACS:    Anginal Classification: CCS III  Anti-ischemic medical therapy: Minimal Therapy (1 class of medications)  Non-Invasive Test Results: Low-risk stress test findings: cardiac mortality <1%/year  Prior CABG: No previous CABG       

## 2017-05-29 ENCOUNTER — Encounter (HOSPITAL_COMMUNITY): Payer: Self-pay | Admitting: Interventional Cardiology

## 2017-05-29 MED FILL — Heparin Sodium (Porcine) 2 Unit/ML in Sodium Chloride 0.9%: INTRAMUSCULAR | Qty: 1000 | Status: AC

## 2017-05-29 NOTE — Telephone Encounter (Signed)
Patient seen in office on 05/25/17.

## 2017-06-22 ENCOUNTER — Institutional Professional Consult (permissible substitution): Payer: Medicare Other | Admitting: Emergency Medicine

## 2017-06-27 ENCOUNTER — Encounter: Payer: Self-pay | Admitting: Cardiovascular Disease

## 2017-06-27 ENCOUNTER — Ambulatory Visit: Payer: Medicare Other | Admitting: Cardiovascular Disease

## 2017-06-27 ENCOUNTER — Encounter: Payer: Self-pay | Admitting: Internal Medicine

## 2017-06-27 DIAGNOSIS — I10 Essential (primary) hypertension: Secondary | ICD-10-CM

## 2017-06-27 DIAGNOSIS — E785 Hyperlipidemia, unspecified: Secondary | ICD-10-CM | POA: Diagnosis not present

## 2017-06-27 DIAGNOSIS — Z951 Presence of aortocoronary bypass graft: Secondary | ICD-10-CM

## 2017-06-27 NOTE — Assessment & Plan Note (Signed)
History of essential hypertension blood pressure measured 120/74.He is on metoprolol. Current meds at current dosing.

## 2017-06-27 NOTE — Patient Instructions (Signed)
Medication Instructions:  Your physician recommends that you continue on your current medications as directed. Please refer to the Current Medication list given to you today.   Labwork: none  Testing/Procedures: none  Follow-Up: We request that you follow-up in: 6 months with Kerin Ransom, PA  and in 12 months with Dr Andria Rhein will receive a reminder letter in the mail two months in advance. If you don't receive a letter, please call our office to schedule the follow-up appointment.    Any Other Special Instructions Will Be Listed Below (If Applicable).     If you need a refill on your cardiac medications before your next appointment, please call your pharmacy.

## 2017-06-27 NOTE — Assessment & Plan Note (Signed)
History of dyslipidemia on statin therapy followed by PCP

## 2017-06-27 NOTE — Progress Notes (Signed)
7/34/1935 Lucas Morales   [birth year]  [id number]  Primary Physician Deland Pretty, MD Primary Cardiologist: Lorretta Harp MD FACP, [place], [place], [place]  HPI:  Lucas Morales is a 84 y.o.  moderately overweight, married, Caucasian male, father of two, who I last saw in the office 01/12/17. He has a history of hypertension and hyperlipidemia. He was initially referred to me because of dyspnea. A CT angiogram of his heart performed three years prior showed moderate plaquing in all three coronary arteries with a calcium score of 800, and a 2D echo revealed an EF of 45 to 50%. Because of increasing symptoms of dyspnea on exertion, as well as chest pain radiating to his left upper extremity, a Myoview stress test was performed on January 04, 2012, which showed inferolateral ischemia. Ultimately he was catheterized by Dr. Glenetta Hew on October 24, revealing two vessel disease, and he underwent coronary artery bypass grafting x4 by Dr. Tharon Aquas Trigt on October 31 with a LIMA to his LAD, vein to a distal LAD, obtuse marginal brach 3 and 4. His postoperative course was complicated by PAF, for which he was placed on Coumadin anticoagulation, Digoxin, and amiodarone. When I saw him back in January, he had a prolonged episode of chest pain. A Myoview stress test performed on January 21 was nonischemic. A MCOT showed no evidence of PAF, and as a result, I stopped his amiodarone, Coumadin, and Digoxin. He participated in cardiac rehab.  He saw Tenny Craw [facility] back in the office in March and July. He's been complaining of progressive dyspnea and chest pain. I did order a Myoview stress test and 2-D echocardiogram which revealed normal LV systolic function with severe concentric left hypertrophy. The Myoview showed subtle interval septal ischemia I do not think were significantly abnormal to warrant further evaluation at this time. Since I saw him in the office back in April 2017 he's done well. He has  had a failed left hip revision by Dr. [name] and a 5/6 discectomy by Dr. Lorin Mercy in November of last year. He saw Kerin Ransom in the office 04/25/16 complaining of dyspnea. A Myoview stresstest was ordered 05/03/16 which was entirely normal. He does admit to being under a lot of stress at home because of family issues which I suspect is contributory. Since I saw him back 07/06/16 he recently  noticed increasing dyspnea on exertion and chest fullness. He scheduled to go to [place] and [place] for 5 weeks in the near future. I perform I.e. Stress testing on him 01/17/17 which was normal. He did see Kerin Ransom in the office 05/25/17 complaining of chest pain ultimately underwent cardiac catheterization several days later by Dr. Tamala Julian revealing a patent LIMA to the LAD, patent vein to distal right coronary artery and an occluded vein graft to an obtuse marginal branch branch with a patent continuation EF was normal. Subsequent abdominal CT showed a enlarging hiatal hernia which has been treated medically and his symptoms have improved.      Current Meds  Medication Sig  . allopurinol (ZYLOPRIM) 300 MG tablet Take 300 mg by mouth daily.  [name] aspirin EC 81 MG tablet Take 81 mg by mouth daily.  [name] atorvastatin (LIPITOR) 40 MG tablet TAKE 1 TABLET BY MOUTH DAILY AT 6 PM  . calcium carbonate (TUMS EX) 750 MG chewable tablet Chew 6-7 tablets by mouth daily as needed for heartburn.   [name] FIBER SELECT GUMMIES PO Take 3-4 tablets by mouth daily  as needed (fiber).   [name] loperamide (IMODIUM) 2 MG capsule Take 6 mg by mouth daily as needed for diarrhea or loose stools.   . metoprolol succinate (TOPROL-XL) 25 MG 24 hr tablet Take 25 mg by mouth daily.  . Misc Natural Products (APPLE CIDER VINEGAR) TABS Take 1 tablet by mouth 2 (two) times daily.  . Multiple Vitamins-Minerals (MULTIVITAMIN WITH MINERALS) tablet Take 1 tablet by mouth daily.    . naproxen (NAPROSYN) 500 MG tablet TAKE 1 TABLET BY MOUTH UP TO TWICE DAILY WITH  FOOD AS NEEDED FOR INFLAMMATION OR PAIN  . OVER THE COUNTER MEDICATION Apply 1 application topically daily as needed (For fungal infection.). Ciclopirox 8% Topical Solution  . pantoprazole (PROTONIX) 40 MG tablet Take 1 tablet (40 mg total) by mouth 2 (two) times daily.  . tadalafil (CIALIS) 5 MG tablet Take 5 mg by mouth daily. For interstitial cystitis.     Allergies  Allergen Reactions  . Morphine Itching    Social History   Socioeconomic History  . Marital status: Married    Spouse name: Not on file  . Number of children: 2  . Years of education: Not on file  . Highest education level: Not on file  Occupational History  . Occupation: Retired    Fish farm manager: RETIRED  Social Needs  . Financial resource strain: Not on file  . Food insecurity:    Worry: Not on file    Inability: Not on file  . Transportation needs:    Medical: Not on file    Non-medical: Not on file  Tobacco Use  . Smoking status: Never Smoker  . Smokeless tobacco: Never Used  Substance and Sexual Activity  . Alcohol use: No    Comment: 09/01/2014 "glass of wine a few times/yr; if that"  . Drug use: No  . Sexual activity: Yes  Lifestyle  . Physical activity:    Days per week: Not on file    Minutes per session: Not on file  . Stress: Not on file  Relationships  . Social connections:    Talks on phone: Not on file    Gets together: Not on file    Attends religious service: Not on file    Active member of club or organization: Not on file    Attends meetings of clubs or organizations: Not on file    Relationship status: Not on file  . Intimate partner violence:    Fear of current or ex partner: Not on file    Emotionally abused: Not on file    Physically abused: Not on file    Forced sexual activity: Not on file  Other Topics Concern  . Not on file  Social History Narrative  . Not on file     Review of Systems: General: negative for chills, fever, night sweats or weight changes.    Cardiovascular: negative for chest pain, dyspnea on exertion, edema, orthopnea, palpitations, paroxysmal nocturnal dyspnea or shortness of breath Dermatological: negative for rash Respiratory: negative for cough or wheezing Urologic: negative for hematuria Abdominal: negative for nausea, vomiting, diarrhea, bright red blood per rectum, melena, or hematemesis Neurologic: negative for visual changes, syncope, or dizziness All other systems reviewed and are otherwise negative except as noted above.    Blood pressure 120/74, pulse 72, height 6\' 3"  (1.905 m), weight 238 lb (108 kg).  General appearance: alert and no distress Neck: no adenopathy, no carotid bruit, no JVD, supple, symmetrical, trachea midline and thyroid not enlarged, symmetric,  no tenderness/mass/nodules Lungs: clear to auscultation bilaterally Heart: regular rate and rhythm, S1, S2 normal, no murmur, click, rub or gallop Extremities: extremities normal, atraumatic, no cyanosis or edema Pulses: 2+ and symmetric Skin: Skin color, texture, turgor normal. No rashes or lesions Neurologic: Alert and oriented X 3, normal strength and tone. Normal symmetric reflexes. Normal coordination and gait  EKG not performed today  ASSESSMENT AND PLAN:   Essential hypertension History of essential hypertension blood pressure measured 120/74.He is on metoprolol. Current meds at current dosing.  Dyslipidemia History of dyslipidemia on statin therapy followed by PCP  Hx of CABG History of bypass grafting times 12/22/2011. He had a negative Myoview stress test on 01/17/17 and a recent cardiac catheterization performed by Dr. Tamala Julian because of chest pain 05/28/17 revealing an occluded vein to obtuse marginal branch with a patent continuation (sequential graft between 2 marginals). The vein to the right was patent as was the LIMA to the LAD and LV function was normal. Medical therapy was recommended. There was a question of aneurysm formation in  the saphenous vein. He did have an abdominal CT that showed enlarging hiatal hernia and this probably was the cause of his pain which has improved with pharmacologic therapy.      Lorretta Harp MD FACP,FACC,FAHA, [facility] 06/27/2017 5:02 PM

## 2017-06-27 NOTE — Assessment & Plan Note (Signed)
History of bypass grafting times 12/22/2011. He had a negative Myoview stress test on 01/17/17 and a recent cardiac catheterization performed by Dr. Tamala Julian because of chest pain 05/28/17 revealing an occluded vein to obtuse marginal branch with a patent continuation (sequential graft between 2 marginals). The vein to the right was patent as was the LIMA to the LAD and LV function was normal. Medical therapy was recommended. There was a question of aneurysm formation in the saphenous vein. He did have an abdominal CT that showed enlarging hiatal hernia and this probably was the cause of his pain which has improved with pharmacologic therapy.

## 2017-07-23 ENCOUNTER — Other Ambulatory Visit: Payer: Self-pay | Admitting: *Deleted

## 2017-07-23 MED ORDER — ATORVASTATIN CALCIUM 40 MG PO TABS: 40.0000 mg | ORAL_TABLET | Freq: Every day | ORAL | 6 refills | Status: DC

## 2018-01-29 ENCOUNTER — Ambulatory Visit (INDEPENDENT_AMBULATORY_CARE_PROVIDER_SITE_OTHER): Payer: Self-pay

## 2018-01-29 ENCOUNTER — Ambulatory Visit (INDEPENDENT_AMBULATORY_CARE_PROVIDER_SITE_OTHER): Payer: Medicare Other | Admitting: Orthopaedic Surgery

## 2018-01-29 ENCOUNTER — Encounter (INDEPENDENT_AMBULATORY_CARE_PROVIDER_SITE_OTHER): Payer: Self-pay | Admitting: Orthopaedic Surgery

## 2018-01-29 VITALS — BP 125/73 | HR 69 | Ht 73.0 in | Wt 240.0 lb

## 2018-01-29 DIAGNOSIS — M545 Low back pain, unspecified: Secondary | ICD-10-CM

## 2018-01-29 NOTE — Progress Notes (Signed)
Office Visit Note   Patient: Lucas Morales           Date of Birth: Mar 08, 1934           MRN: [id number] Visit Date: 01/29/2018              Requested by: Deland Pretty, MD [address] [place], [place] [address] PCP: Deland Pretty, MD   Assessment & Plan: Visit Diagnoses:  1. Acute bilateral low back pain, unspecified whether sciatica present     Plan: We discussed x-ray changes from previous decompression.  Negative for acute fracture.  He does have some anterolisthesis at L4-5 and may have some further narrowing at that level.  He will return if he has increased symptoms.  Follow-Up Instructions: Return if symptoms worsen or fail to improve.   Orders:  Orders Placed This Encounter  Procedures  . XR Lumbar Spine 2-3 Views   No orders of the defined types were placed in this encounter.     Procedures: No procedures performed   Clinical Data: No additional findings.   Subjective: Chief Complaint  Patient presents with  . Lower Back - Pain    HPI 84 year old male states he has had trouble with recurrent back symptoms after he had a time of significant increase activities.  Some pain radiates down into both hips he is been using some Naprosyn taking 3 every other day with some relief.  He denies associated bowel or bladder symptoms.  Previous cervical fusion doing well he denies myelopathic symptoms.  Previous hip fracture with hardware removal later and left bursectomy.  Previous lumbar decompression L2-3, L3-4 for severe spinal stenosis in 2011 by Dr. Lorin Mercy.  Review of Systems 14 point review of systems updated unchanged from 06/06/2016 office visit other than as mentioned in HPI.  Patient has moderate coronary artery disease, history of GERD, hypertension, hip fracture.   Objective: Vital Signs: BP 125/73   Pulse 69   Ht 6\' 1"  (1.854 m)   Wt 240 lb (108.9 kg)   BMI 31.66 kg/m   Physical Exam  Constitutional: He is oriented to person,  place, and time. He appears well-developed and well-nourished.  HENT:  Head: Normocephalic and atraumatic.  Eyes: Pupils are equal, round, and reactive to light. EOM are normal.  Neck: No tracheal deviation present. No thyromegaly present.  Cardiovascular: Normal rate.  Pulmonary/Chest: Effort normal. He has no wheezes.  Abdominal: Soft. Bowel sounds are normal.  Neurological: He is alert and oriented to person, place, and time.  Skin: Skin is warm and dry. Capillary refill takes less than 2 seconds.  Psychiatric: He has a normal mood and affect. His behavior is normal. Judgment and thought content normal.    Ortho Exam patient has some mild trochanteric bursal tenderness he is amatory without limp.  Distal pulses palpable negative Homan test. Specialty Comments:  No specialty comments available.  Imaging: No results found.   PMFS History: Patient Active Problem List   Diagnosis Date Noted  . Abnormal EKG 05/25/2017  . S/P cervical spinal fusion 01/25/2016  . Cervical spinal stenosis 12/20/2015  . Cervical spondylosis 12/20/2015  . Painful orthopaedic hardware left hip with chronic trochanteric bursitis 07/30/2015  . Hip bursitis 07/30/2015  . Trochanteric bursitis of left hip 07/30/2015  . Chest pain with moderate risk of acute coronary syndrome 10/19/2014  . Macular hole, left eye 09/01/2014  . Macular hole of left eye 08/28/2014  . Other pancytopenia ([place])   . Sliding  hiatal hernia   . Gastric erosions   . GI bleeding 06/04/2014  . Acute GI bleeding 06/04/2014  . Orthostatic hypotension 06/04/2014  . Acute upper gastrointestinal bleeding 06/04/2014  . Other specified diabetes mellitus without complications ([place])   . Acute blood loss anemia   . Thrombocytopenia ([place])   . Closed left hip fracture ([place]) 04/03/2014  . Fracture, femur ([place]) 04/03/2014  . Femur fracture ([place]) 04/03/2014  . Upper airway cough syndrome 05/16/2013  . OSA (obstructive sleep apnea) 10/21/2012    . Macular hole 10/08/2012  . Preoperative clearance 09/30/2012  . PAF (paroxysmal atrial fibrillation) ([place]) 01/21/2012  . Shortness of breath on exertion 01/11/2012  . Bradycardia, sinus 01/11/2012  . Hx of CABG 01/11/2012  . Essential hypertension   . Dyslipidemia   . ABDOMINAL PAIN-LLQ 02/04/2010  . HEMORRHOIDS 08/31/2008  . ESOPHAGEAL STRICTURE 08/31/2008  . GERD 08/31/2008  . Gastritis and gastroduodenitis 08/31/2008  . HIATAL HERNIA 08/31/2008  . DIVERTICULOSIS, COLON 08/31/2008  . Chronic interstitial cystitis 08/31/2008  . COLONIC POLYPS, ADENOMATOUS, HX OF 08/31/2008   Past Medical History:  Diagnosis Date  . Adenomatous colon polyp   . Alpha-1-antitrypsin deficiency ([place])   . Alpha-1-antitrypsin deficiency carrier   . Anemia   . Anxiety   . Arthritis    "knees; bad in my back" (09/01/2014)  . Bruises easily   . Chronic bronchitis ([place])    "get it pretty much q yr" (09/01/2014)  . Chronic lower back pain   . Coronary artery disease   . Depression    "related to health" (09/01/2014)  . Diarrhea    has had a part of colon removed  . Diverticulitis   . Diverticulosis   . Dyspnea on exertion   . Dysrhythmia    bradycardia in the 30's (01/11/2012).. Afib briefly after CABG  . Esophageal dysmotility   . Esophageal stricture   . Gastritis   . GERD (gastroesophageal reflux disease)    takes Pantoprazole daily  . Gout   . Hemorrhoids   . Hiatal hernia   . History of colon polyps   . Hyperlipidemia    doesn't require meds  . Hypertension    takes Losartan daily; current not taking   . IC (interstitial cystitis)   . Insomnia    takes Nortrypyline nightly  . Joint pain   . Joint swelling   . Macular hole of both eyes   . Nocturia   . OSA (obstructive sleep apnea)    "mild; tried CPAP; couldn't tolerate" (09/01/2014)  . Paroxysmal atrial fibrillation (HCC)   . Restless leg syndrome   . Sciatic nerve pain   . Type II diabetes mellitus ([place])    "borderline"   . Urinary frequency     Family History  Problem Relation Age of Onset  . Emphysema Mother   . Kidney disease Father   . Emphysema Maternal Grandfather   . Colon cancer Neg Hx     Past Surgical History:  Procedure Laterality Date  . [place] VITRECTOMY WITH 20 GAUGE MVR PORT FOR MACULAR HOLE Right 10/08/2012   Procedure: 25 GAUGE PARS PLANA VITRECTOMY WITH 20 GAUGE MVR PORT FOR MACULAR HOLE; Membrame peel, serum patch; Laser treatment ; Gas exchange;  Surgeon: Hayden Pedro, MD;  Location: [facility];  Service: Ophthalmology;  Laterality: Right;  . 25 GAUGE PARS PLANA VITRECTOMY WITH 20 GAUGE MVR PORT FOR MACULAR HOLE Left 09/01/2014   Procedure: 25 GAUGE PARS PLANA VITRECTOMY WITH 20  GAUGE MVR PORT FOR MACULAR HOLE;  Surgeon: Hayden Pedro, MD;  Location: [facility];  Service: Ophthalmology;  Laterality: Left;  . ANTERIOR CERVICAL DECOMP/DISCECTOMY FUSION N/A 12/20/2015   Procedure: C5-6 Anterior Cervical Discectomy and Fusion, Allograft, and Plate;  Surgeon: [name], MD;  Location: [facility];  Service: Orthopedics;  Laterality: N/A;  . APPENDECTOMY  2009  . bladder lesion removed    . CARDIAC CATHETERIZATION  01/11/2012  . CATARACT EXTRACTION W/ INTRAOCULAR LENS  IMPLANT, BILATERAL Bilateral 2011  . CHOLECYSTECTOMY  2009  . COLONOSCOPY    . COMPRESSION HIP SCREW Left 04/04/2014   Procedure: COMPRESSION HIP LEFT;  Surgeon: Mcarthur Rossetti, MD;  Location: [facility];  Service: Orthopedics;  Laterality: Left;  . CORONARY ANGIOPLASTY    . CORONARY ARTERY BYPASS GRAFT  01/18/2012   Procedure: CORONARY ARTERY BYPASS GRAFTING (CABG);  Surgeon: Ivin Poot, MD;  Location: [facility];  Service: Open Heart Surgery;  Laterality: N/A;  times four, using left internal mammary artery  . CYSTOSCOPY WITH URETEROSCOPY  2008; 2009   "painted inside of bladder wall"  . ELBOW SURGERY Right 1988   "reconstructive OR"  . ENDOVEIN HARVEST OF GREATER SAPHENOUS VEIN  01/18/2012   Procedure: ENDOVEIN  HARVEST OF GREATER SAPHENOUS VEIN;  Surgeon: Ivin Poot, MD;  Location: [facility];  Service: Open Heart Surgery;  Laterality: Right;  . ESOPHAGOGASTRODUODENOSCOPY    . ESOPHAGOGASTRODUODENOSCOPY N/A 06/05/2014   Procedure: ESOPHAGOGASTRODUODENOSCOPY (EGD);  Surgeon: Irene Shipper, MD;  [facility];  Service: Endoscopy;  Laterality: N/A;  . EXCISION/RELEASE BURSA HIP Left 07/30/2015   Procedure: EXCISION/RELEASE BURSA HIP;  Surgeon: Mcarthur Rossetti, MD;  [facility];  Service: Orthopedics;  Laterality: Left;  . EXPLORATORY LAPAROTOMY  2009   with right colectomy  . FRACTURE SURGERY    . GAS INSERTION Left 09/01/2014   Procedure: INSERTION OF GAS;  Surgeon: Hayden Pedro, MD;  Location: [facility];  Service: Ophthalmology;  Laterality: Left;  C3F8  . GAS/FLUID EXCHANGE Left 09/01/2014   Procedure: GAS/FLUID EXCHANGE;  Surgeon: Hayden Pedro, MD;  Location: [facility];  Service: Ophthalmology;  Laterality: Left;  . HARDWARE REMOVAL Left 07/30/2015   Procedure: attempted LEFT HIP HARDWARE REMOVAL, LEFT HIP BURSECTOMY;  Surgeon: Mcarthur Rossetti, MD;  [facility];  Service: Orthopedics;  Laterality: Left;  . HEEL SPUR EXCISION Bilateral 1970's  . INTERTROCHANTERIC HIP FRACTURE SURGERY Left 04/04/2014  . JOINT REPLACEMENT    . LEFT HEART CATH AND CORS/GRAFTS ANGIOGRAPHY N/A 05/28/2017   Procedure: LEFT HEART CATH AND CORS/GRAFTS ANGIOGRAPHY;  Surgeon: Belva Crome, MD;  Location: [facility];  Service: Cardiovascular;  Laterality: N/A;  . LEFT HEART CATHETERIZATION WITH CORONARY ANGIOGRAM N/A 01/11/2012   Procedure: LEFT HEART CATHETERIZATION WITH CORONARY ANGIOGRAM;  Surgeon: Leonie Man, MD;  [facility];  Service: Cardiovascular;  Laterality: N/A;  . LUMBAR [place] SURGERY  ?2010   "cleaned out the stenosis" (01/11/2012)  . MEMBRANE PEEL Left 09/01/2014   Procedure: MEMBRANE PEEL;  Surgeon: Hayden Pedro, MD;  Location: [facility];  Service: Ophthalmology;   Laterality: Left;  . NM MYOCAR MULTIPLE W/SPECT  04/09/2012   NORMAL STRESS NUCLEAR STUDY. EF 51%.  [name] PARS PLANA VITRECTOMY W/ REPAIR OF MACULAR HOLE Left 09/01/2014  . PARTIAL COLECTOMY     d/t diverticulosis  . PHOTOCOAGULATION WITH LASER Left 09/01/2014   Procedure: PHOTOCOAGULATION WITH LASER;  Surgeon: Hayden Pedro, MD;  Location:  [facility];  Service: Ophthalmology;  Laterality: Left;  HEADSCOPE LASER  . RECONSTRUCTION MEDIAL COLLATERAL LIGAMENT ELBOW W/ TENDON GRAFT Right 1988 X 2  . SERUM PATCH Left 09/01/2014   Procedure: SERUM PATCH;  Surgeon: Hayden Pedro, MD;  Location: [facility];  Service: Ophthalmology;  Laterality: Left;  . TONSILLECTOMY AND ADENOIDECTOMY  ~1943  . TOTAL KNEE ARTHROPLASTY Bilateral ~ 2001; 2011   right; left  . TRANSTHORACIC ECHOCARDIOGRAM  01/04/2012   MODERATE SEVERE-SEVERE LVH. EF=>55%. MILDLY IMPAIRED LV RELAXATION. LA IS MODERATE TO SEVERE DILATED. MODERATE MR. MV LEAFLETS APPEAR THICHENED.  [name] TUMOR EXCISION Left 12/2011   arm   Social History   Occupational History  . Occupation: Retired    Fish farm manager: RETIRED  Tobacco Use  . Smoking status: Never Smoker  . Smokeless tobacco: Never Used  Substance and Sexual Activity  . Alcohol use: No    Comment: 09/01/2014 "glass of wine a few times/yr; if that"  . Drug use: No  . Sexual activity: Yes

## 2018-01-30 ENCOUNTER — Encounter (INDEPENDENT_AMBULATORY_CARE_PROVIDER_SITE_OTHER): Payer: Self-pay | Admitting: Orthopaedic Surgery

## 2018-02-18 ENCOUNTER — Other Ambulatory Visit: Payer: Self-pay | Admitting: Cardiovascular Disease

## 2018-03-26 ENCOUNTER — Other Ambulatory Visit (INDEPENDENT_AMBULATORY_CARE_PROVIDER_SITE_OTHER): Payer: Self-pay

## 2018-03-26 ENCOUNTER — Ambulatory Visit (INDEPENDENT_AMBULATORY_CARE_PROVIDER_SITE_OTHER): Payer: Self-pay

## 2018-03-26 ENCOUNTER — Encounter (INDEPENDENT_AMBULATORY_CARE_PROVIDER_SITE_OTHER): Payer: Self-pay | Admitting: Orthopaedic Surgery

## 2018-03-26 ENCOUNTER — Ambulatory Visit (INDEPENDENT_AMBULATORY_CARE_PROVIDER_SITE_OTHER): Payer: Medicare Other | Admitting: Orthopaedic Surgery

## 2018-03-26 DIAGNOSIS — M4807 Spinal stenosis, lumbosacral region: Secondary | ICD-10-CM

## 2018-03-26 DIAGNOSIS — M5416 Radiculopathy, lumbar region: Secondary | ICD-10-CM

## 2018-03-26 DIAGNOSIS — M25551 Pain in right hip: Secondary | ICD-10-CM

## 2018-03-26 NOTE — Progress Notes (Signed)
Office Visit Note   Patient: Lucas Morales           Date of Birth: 02/20/1934           MRN: [id number] Visit Date: 03/26/2018              Requested by: Deland Pretty, MD [address] [place], [place] [address] PCP: Deland Pretty, MD   Assessment & Plan: Visit Diagnoses:  1. Pain in right hip   2. Lumbar radiculopathy     Plan: We will have him undergo an MRI of his lumbar spine without HNP as a source of his right leg radiculopathy.  He will follow-up with [place] after the MRI to go over results and discuss further treatment.  Questions encouraged and answered by Dr. Ninfa Linden and myself.  Continue his naproxen.  Follow-Up Instructions: Return in about 2 weeks (around 04/09/2018) for After MRI Lumbar spine.   Orders:  Orders Placed This Encounter  Procedures  . XR HIP UNILAT W OR W/O PELVIS 1V RIGHT   No orders of the defined types were placed in this encounter.     Procedures: No procedures performed   Clinical Data: No additional findings.   Subjective: Chief Complaint  Patient presents with  . Right Hip - Pain    HPI Lucas Morales is a 85 year old male comes in today with right hip pain and buttocks pain.  He seen by Dr. Inda Merlin for his back and was last seen by him in November at that time patient reports he was given Medrol and this helped with his back pain.  He is currently taking some naproxen which helps with his right hip and buttocks pain.  He describes a soreness and occasional like numbness down the right leg mostly to the knee but occasionally down to the ankle.  Pain does awaken him at night.  Denies any change in bowel bladder function.  Does suffer from interstitial cystitis and has frequent urination.  Denies any groin pain.  Had a previous lumbar decompression at L2-3 L3-4 for severe spinal stenosis by Dr. Lorin Mercy in 2011.  Radiographs of his lumbar spine done in November 2019 showed.  L4-5 anterior spondylolisthesis grade 1.  Decreased  to space at the L4-5 level.  No acute fractures of the lumbar spine. Review of Systems Please see HPI otherwise negative  Objective: Vital Signs: There were no vitals taken for this visit.  Physical Exam General: Well-developed well-nourished male no acute distress Psych alert and oriented x3. Ortho Exam Bilateral hips good range of motion he does have some discomfort with extremes of internal and external rotation of the right hip no pain with left hip internal and external rotation.  Tight hamstrings bilaterally.  Very limited flexion of the lumbar spine comes within a foot of the touching his toes.  Lumbar extension also limited.  Positive straight leg raise on the right negative on the left.  5 out of 5 strength throughout lower extremities.  Deep tendon reflexes are equal and symmetric at the knees and patellas.  Dorsal pedal pulses are present bilaterally.  Sensation grossly intact bilateral feet to light touch.  Tenderness over the right initial tuberosity region.  Minimal tenderness over the right greater trochanteric region.  Right knee good range of motion without pain.  No instability with valgus varus stress in the right knee.  Well-healed surgical incision from previous right total knee arthroplasty.  No effusion abnormal warmth or erythema of the right knee. Specialty  Comments:  No specialty comments available.  Imaging: Xr Hip Unilat W Or W/o Pelvis 1v Right  Result Date: 03/26/2018 AP pelvis and lateral view right hip: Status post IM nailing left hip fracture with no hardware failure.  AP view.  Hip joints are well preserved bilaterally.  No acute fractures bony abnormalities.    PMFS History: Patient Active Problem List   Diagnosis Date Noted  . Abnormal EKG 05/25/2017  . S/P cervical spinal fusion 01/25/2016  . Cervical spinal stenosis 12/20/2015  . Cervical spondylosis 12/20/2015  . Painful orthopaedic hardware left hip with chronic trochanteric bursitis 07/30/2015  .  Hip bursitis 07/30/2015  . Trochanteric bursitis of left hip 07/30/2015  . Chest pain with moderate risk of acute coronary syndrome 10/19/2014  . Macular hole, left eye 09/01/2014  . Macular hole of left eye 08/28/2014  . Other pancytopenia ([place])   . Sliding hiatal hernia   . Gastric erosions   . GI bleeding 06/04/2014  . Acute GI bleeding 06/04/2014  . Orthostatic hypotension 06/04/2014  . Acute upper gastrointestinal bleeding 06/04/2014  . Other specified diabetes mellitus without complications ([place])   . Acute blood loss anemia   . Thrombocytopenia ([place])   . Closed left hip fracture ([place]) 04/03/2014  . Fracture, femur ([place]) 04/03/2014  . Femur fracture ([place]) 04/03/2014  . Upper airway cough syndrome 05/16/2013  . OSA (obstructive sleep apnea) 10/21/2012  . Macular hole 10/08/2012  . Preoperative clearance 09/30/2012  . PAF (paroxysmal atrial fibrillation) ([place]) 01/21/2012  . Shortness of breath on exertion 01/11/2012  . Bradycardia, sinus 01/11/2012  . Hx of CABG 01/11/2012  . Essential hypertension   . Dyslipidemia   . ABDOMINAL PAIN-LLQ 02/04/2010  . HEMORRHOIDS 08/31/2008  . ESOPHAGEAL STRICTURE 08/31/2008  . GERD 08/31/2008  . Gastritis and gastroduodenitis 08/31/2008  . HIATAL HERNIA 08/31/2008  . DIVERTICULOSIS, COLON 08/31/2008  . Chronic interstitial cystitis 08/31/2008  . COLONIC POLYPS, ADENOMATOUS, HX OF 08/31/2008   Past Medical History:  Diagnosis Date  . Adenomatous colon polyp   . Alpha-1-antitrypsin deficiency ([place])   . Alpha-1-antitrypsin deficiency carrier   . Anemia   . Anxiety   . Arthritis    "knees; bad in my back" (09/01/2014)  . Bruises easily   . Chronic bronchitis ([place])    "get it pretty much q yr" (09/01/2014)  . Chronic lower back pain   . Coronary artery disease   . Depression    "related to health" (09/01/2014)  . Diarrhea    has had a part of colon removed  . Diverticulitis   . Diverticulosis   . Dyspnea on exertion   .  Dysrhythmia    bradycardia in the 30's (01/11/2012).. Afib briefly after CABG  . Esophageal dysmotility   . Esophageal stricture   . Gastritis   . GERD (gastroesophageal reflux disease)    takes Pantoprazole daily  . Gout   . Hemorrhoids   . Hiatal hernia   . History of colon polyps   . Hyperlipidemia    doesn't require meds  . Hypertension    takes Losartan daily; current not taking   . IC (interstitial cystitis)   . Insomnia    takes Nortrypyline nightly  . Joint pain   . Joint swelling   . Macular hole of both eyes   . Nocturia   . OSA (obstructive sleep apnea)    "mild; tried CPAP; couldn't tolerate" (09/01/2014)  . Paroxysmal atrial fibrillation (HCC)   . Restless leg syndrome   .  Sciatic nerve pain   . Type II diabetes mellitus ([place])    "borderline"  . Urinary frequency     Family History  Problem Relation Age of Onset  . Emphysema Mother   . Kidney disease Father   . Emphysema Maternal Grandfather   . Colon cancer Neg Hx     Past Surgical History:  Procedure Laterality Date  . [place] VITRECTOMY WITH 20 GAUGE MVR PORT FOR MACULAR HOLE Right 10/08/2012   Procedure: 25 GAUGE PARS PLANA VITRECTOMY WITH 20 GAUGE MVR PORT FOR MACULAR HOLE; Membrame peel, serum patch; Laser treatment ; Gas exchange;  Surgeon: Hayden Pedro, MD;  Location: [facility];  Service: Ophthalmology;  Laterality: Right;  . 25 GAUGE PARS PLANA VITRECTOMY WITH 20 GAUGE MVR PORT FOR MACULAR HOLE Left 09/01/2014   Procedure: 25 GAUGE PARS PLANA VITRECTOMY WITH 20 GAUGE MVR PORT FOR MACULAR HOLE;  Surgeon: Hayden Pedro, MD;  Location: [facility];  Service: Ophthalmology;  Laterality: Left;  . ANTERIOR CERVICAL DECOMP/DISCECTOMY FUSION N/A 12/20/2015   Procedure: C5-6 Anterior Cervical Discectomy and Fusion, Allograft, and Plate;  Surgeon: [name], MD;  Location: [facility];  Service: Orthopedics;  Laterality: N/A;  . APPENDECTOMY  2009  . bladder lesion removed    . CARDIAC CATHETERIZATION   01/11/2012  . CATARACT EXTRACTION W/ INTRAOCULAR LENS  IMPLANT, BILATERAL Bilateral 2011  . CHOLECYSTECTOMY  2009  . COLONOSCOPY    . COMPRESSION HIP SCREW Left 04/04/2014   Procedure: COMPRESSION HIP LEFT;  Surgeon: Mcarthur Rossetti, MD;  Location: [facility];  Service: Orthopedics;  Laterality: Left;  . CORONARY ANGIOPLASTY    . CORONARY ARTERY BYPASS GRAFT  01/18/2012   Procedure: CORONARY ARTERY BYPASS GRAFTING (CABG);  Surgeon: Ivin Poot, MD;  Location: [facility];  Service: Open Heart Surgery;  Laterality: N/A;  times four, using left internal mammary artery  . CYSTOSCOPY WITH URETEROSCOPY  2008; 2009   "painted inside of bladder wall"  . ELBOW SURGERY Right 1988   "reconstructive OR"  . ENDOVEIN HARVEST OF GREATER SAPHENOUS VEIN  01/18/2012   Procedure: ENDOVEIN HARVEST OF GREATER SAPHENOUS VEIN;  Surgeon: Ivin Poot, MD;  Location: [facility];  Service: Open Heart Surgery;  Laterality: Right;  . ESOPHAGOGASTRODUODENOSCOPY    . ESOPHAGOGASTRODUODENOSCOPY N/A 06/05/2014   Procedure: ESOPHAGOGASTRODUODENOSCOPY (EGD);  Surgeon: Irene Shipper, MD;  [facility];  Service: Endoscopy;  Laterality: N/A;  . EXCISION/RELEASE BURSA HIP Left 07/30/2015   Procedure: EXCISION/RELEASE BURSA HIP;  Surgeon: Mcarthur Rossetti, MD;  [facility];  Service: Orthopedics;  Laterality: Left;  . EXPLORATORY LAPAROTOMY  2009   with right colectomy  . FRACTURE SURGERY    . GAS INSERTION Left 09/01/2014   Procedure: INSERTION OF GAS;  Surgeon: Hayden Pedro, MD;  Location: [facility];  Service: Ophthalmology;  Laterality: Left;  C3F8  . GAS/FLUID EXCHANGE Left 09/01/2014   Procedure: GAS/FLUID EXCHANGE;  Surgeon: Hayden Pedro, MD;  Location: [facility];  Service: Ophthalmology;  Laterality: Left;  . HARDWARE REMOVAL Left 07/30/2015   Procedure: attempted LEFT HIP HARDWARE REMOVAL, LEFT HIP BURSECTOMY;  Surgeon: Mcarthur Rossetti, MD;  [facility];  Service: Orthopedics;  Laterality: Left;   . HEEL SPUR EXCISION Bilateral 1970's  . INTERTROCHANTERIC HIP FRACTURE SURGERY Left 04/04/2014  . JOINT REPLACEMENT    . LEFT HEART CATH AND CORS/GRAFTS ANGIOGRAPHY N/A 05/28/2017   Procedure: LEFT HEART CATH AND CORS/GRAFTS ANGIOGRAPHY;  Surgeon: Daneen Schick  W, MD;  Location: [facility];  Service: Cardiovascular;  Laterality: N/A;  . LEFT HEART CATHETERIZATION WITH CORONARY ANGIOGRAM N/A 01/11/2012   Procedure: LEFT HEART CATHETERIZATION WITH CORONARY ANGIOGRAM;  Surgeon: Leonie Man, MD;  Location: [facility];  Service: Cardiovascular;  Laterality: N/A;  . LUMBAR [place] SURGERY  ?2010   "cleaned out the stenosis" (01/11/2012)  . MEMBRANE PEEL Left 09/01/2014   Procedure: MEMBRANE PEEL;  Surgeon: Hayden Pedro, MD;  Location: [facility];  Service: Ophthalmology;  Laterality: Left;  . NM MYOCAR MULTIPLE W/SPECT  04/09/2012   NORMAL STRESS NUCLEAR STUDY. EF 51%.  [name] PARS PLANA VITRECTOMY W/ REPAIR OF MACULAR HOLE Left 09/01/2014  . PARTIAL COLECTOMY     d/t diverticulosis  . PHOTOCOAGULATION WITH LASER Left 09/01/2014   Procedure: PHOTOCOAGULATION WITH LASER;  Surgeon: Hayden Pedro, MD;  Location: [facility];  Service: Ophthalmology;  Laterality: Left;  HEADSCOPE LASER  . RECONSTRUCTION MEDIAL COLLATERAL LIGAMENT ELBOW W/ TENDON GRAFT Right 1988 X 2  . SERUM PATCH Left 09/01/2014   Procedure: SERUM PATCH;  Surgeon: Hayden Pedro, MD;  Location: [facility];  Service: Ophthalmology;  Laterality: Left;  . TONSILLECTOMY AND ADENOIDECTOMY  ~1943  . TOTAL KNEE ARTHROPLASTY Bilateral ~ 2001; 2011   right; left  . TRANSTHORACIC ECHOCARDIOGRAM  01/04/2012   MODERATE SEVERE-SEVERE LVH. EF=>55%. MILDLY IMPAIRED LV RELAXATION. LA IS MODERATE TO SEVERE DILATED. MODERATE MR. MV LEAFLETS APPEAR THICHENED.  [name] TUMOR EXCISION Left 12/2011   arm   Social History   Occupational History  . Occupation: Retired    Fish farm manager: RETIRED  Tobacco Use  . Smoking status: Never Smoker  . Smokeless tobacco: Never  Used  Substance and Sexual Activity  . Alcohol use: No    Comment: 09/01/2014 "glass of wine a few times/yr; if that"  . Drug use: No  . Sexual activity: Yes

## 2018-03-31 ENCOUNTER — Ambulatory Visit
Admission: RE | Admit: 2018-03-31 | Discharge: 2018-03-31 | Disposition: A | Discharge status: Home / Self Care | Payer: Medicare Other | Source: Ambulatory Visit | Attending: Orthopaedic Surgery | Admitting: Orthopaedic Surgery

## 2018-03-31 DIAGNOSIS — M4807 Spinal stenosis, lumbosacral region: Secondary | ICD-10-CM

## 2018-04-01 ENCOUNTER — Telehealth (INDEPENDENT_AMBULATORY_CARE_PROVIDER_SITE_OTHER): Payer: Self-pay | Admitting: Orthopaedic Surgery

## 2018-04-01 NOTE — Telephone Encounter (Signed)
ROV work in thanks

## 2018-04-01 NOTE — Telephone Encounter (Signed)
Patient called stating he is in a lot of pain. Patient said he had MRI yesterday and wanted to know if he can get in sooner to see Dr Lorin Mercy or another provider. The number to contact patient is [phone]

## 2018-04-01 NOTE — Telephone Encounter (Signed)
Is there anything you can advise? Patient's MRI report is in the chart.  Would you like for me to work him in sooner?

## 2018-04-02 ENCOUNTER — Telehealth (INDEPENDENT_AMBULATORY_CARE_PROVIDER_SITE_OTHER): Payer: Self-pay

## 2018-04-02 NOTE — Telephone Encounter (Signed)
I had April call and work patient in with Dr. Lorin Mercy tomorrow morning. Patient's appointment with Artis Delay was scheduled for tomorrow afternoon and Dr. Lorin Mercy will be in surgery. Dr. Lorin Mercy will need to review MRI.

## 2018-04-02 NOTE — Telephone Encounter (Signed)
Per Dr. Lorin Mercy, work patient in to schedule. Please offer work in time for Friday afternoon. He will be for MRI review. Thanks.

## 2018-04-02 NOTE — Telephone Encounter (Signed)
Talked with patient and advised him that he would need to see Dr. Lorin Mercy, per [facility]  Appointment scheduled for Wednesday, 04/03/2018.

## 2018-04-02 NOTE — Telephone Encounter (Signed)
Talked with patient and he stated that he is not able to walk and that he has an appointment with Artis Delay on Wednesday, 04/03/2018.  Stated that Artis Delay can consult with Dr. Lorin Mercy after his appointment.

## 2018-04-03 ENCOUNTER — Ambulatory Visit (INDEPENDENT_AMBULATORY_CARE_PROVIDER_SITE_OTHER): Payer: Medicare Other | Admitting: Physician Assistant

## 2018-04-03 ENCOUNTER — Encounter (INDEPENDENT_AMBULATORY_CARE_PROVIDER_SITE_OTHER): Payer: Self-pay | Admitting: Orthopaedic Surgery

## 2018-04-03 ENCOUNTER — Ambulatory Visit (INDEPENDENT_AMBULATORY_CARE_PROVIDER_SITE_OTHER): Payer: Medicare Other | Admitting: Orthopaedic Surgery

## 2018-04-03 ENCOUNTER — Telehealth: Payer: Self-pay

## 2018-04-03 VITALS — BP 118/67 | HR 78 | Ht 73.0 in | Wt 240.0 lb

## 2018-04-03 DIAGNOSIS — M48062 Spinal stenosis, lumbar region with neurogenic claudication: Secondary | ICD-10-CM

## 2018-04-03 NOTE — Progress Notes (Signed)
Office Visit Note   Patient: Lucas Morales           Date of Birth: 04/17/1933           MRN: [id number] Visit Date: 04/03/2018              Requested by: Deland Pretty, MD [address] [place], [place] [address] PCP: Deland Pretty, MD   Assessment & Plan: Visit Diagnoses:  1. Spinal stenosis of lumbar region with neurogenic claudication           Recurrent at L3-4 level.  Plan: Patient has severe stenosis at L3-4 with neurogenic claudication symptoms which have progressed.  We discussed options with him and he would like to proceed with repeat decompression at the L3-4 level for recurrent stenosis.  Risk of dural tear discussed, potential for development of stenosis at other levels at some time in the future.  Risk of anesthesia discussed.  He will obtain preoperative clearance with Dr. Alvester Chou.  Risks of surgery discussed questions were elicited and answered he understands and requests to proceed.  Follow-Up Instructions: No follow-ups on file.   Orders:  No orders of the defined types were placed in this encounter.  No orders of the defined types were placed in this encounter.     Procedures: No procedures performed   Clinical Data: No additional findings.   Subjective: Chief Complaint  Patient presents with  . Lower Back - Pain, Follow-up    MRI Review    HPI 85 year old male returns with recurrent problems with back pain right hip pain buttocks pain that occurs with standing and with walking.  He has had to use a walker due to pain and weakness in his legs.  He took Naprosyn but he states is starting to upset his stomach.  He gets relief with sitting.  Previous L2-3, L3-4 decompression 2011.  Due to his recurrent back leg pain with leg weakness MRI scan has been obtained and is available for review.  MRI scan shows severe multifactorial spinal stenosis with advanced facet arthropathy and hypertrophic ligamentum.  2 mm anterolisthesis is noted with  effacement of the sub-arachnoid space with compression that could occur on both sides at L3-4.  Mild stenosis is noted at L4-5.  Decompression L2-3 shows no recurrent stenosis.  Review of Systems positive for cervical fusion C5-6 for myelopathy.  Compression hip screw 2016 Dr. Ninfa Linden with later left hip bursectomy and hardware removal.  CABG procedure 2013.  Positive for hypertension, dyslipidemia, atrial fib in the past.  Sleep apnea, GERD, hemorrhoids, esophageal stricture.  Positive for hiatal hernia with GI bleed in the past.  Thrombocytopenia.  Previous L2-3, L3-4 lumbar decompression 2011 with recurrent stenosis at L3-4.  Patient wears glasses.   Objective: Vital Signs: BP 118/67 (BP Location: Left Arm, Patient Position: Sitting, Cuff Size: Normal)   Pulse 78   Ht 6\' 1"  (1.854 m)   Wt 240 lb (108.9 kg)   BMI 31.66 kg/m   Physical Exam Constitutional:      Appearance: He is well-developed.  HENT:     Head: Normocephalic and atraumatic.  Eyes:     Pupils: Pupils are equal, round, and reactive to light.  Neck:     Thyroid: No thyromegaly.     Trachea: No tracheal deviation.  Cardiovascular:     Rate and Rhythm: Normal rate.  Pulmonary:     Effort: Pulmonary effort is normal.     Breath sounds: No wheezing.  Abdominal:  General: Bowel sounds are normal.     Palpations: Abdomen is soft.  Skin:    General: Skin is warm and dry.     Capillary Refill: Capillary refill takes less than 2 seconds.  Neurological:     Mental Status: He is alert and oriented to person, place, and time.  Psychiatric:        Behavior: Behavior normal.        Thought Content: Thought content normal.        Judgment: Judgment normal.     Ortho Exam patient ambulates with a walker due to lower extremity weakness.  Has claudication after one half block.  Leg lengths are equal distal pulses are palpable quads anterior tib gastrocsoleus is intact.  Minimal trochanteric bursal tenderness.  Ambulates  without a limp with a walker.  No pitting edema calves are soft.  Specialty Comments:  No specialty comments available.  Imaging: CLINICAL DATA:  Low back pain over the last 2 weeks radiating to the right leg.  EXAM: MRI LUMBAR SPINE WITHOUT CONTRAST  TECHNIQUE: Multiplanar, multisequence MR imaging of the lumbar spine was performed. No intravenous contrast was administered.  COMPARISON:  12/12/2009  FINDINGS: Segmentation: 5 lumbar type vertebral bodies as numbered previously. L5 has some transitional features.  Alignment:  2 mm anterolisthesis L3-4.  4 mm anterolisthesis L4-5.  Vertebrae:  No fracture or primary bone lesion.  Conus medullaris and cauda equina: Conus extends to the T12-L1 level. Conus and cauda equina appear normal.  Paraspinal and other soft tissues: Negative  Disc levels:  T12-L1: Mild disc bulge.  No compressive stenosis.  L1-2: Shallow disc protrusion with slight caudal migration in the midline behind L2. Facet and ligamentous hypertrophy. Mild multifactorial stenosis with narrowing of the lateral recesses and foramina but no definite neural compression. The caudally migrated disc material is quite minimal and does not appear to cause neural compression.  L2-3: Endplate osteophytes and shallow protrusion of the disc. Previous posterior decompression. Bilateral facet hypertrophy. Good decompression of the central canal. Bilateral lateral recess and foraminal stenosis that could cause neural compression.  L3-4: Severe multifactorial spinal stenosis. Advanced facet arthropathy with facet and ligamentous hypertrophy. 2 mm of anterolisthesis. Bulging of the disc. Effacement of the subarachnoid space. Neural compression could occur on either or both sides.  L4-5: Chronic facet fusion. 4 mm of anterolisthesis. Mild bulging of the disc. Mild stenosis of the lateral recesses and neural foramina but no likely ongoing neural  compression.  L5-S1: Transitional level.  No canal or foraminal stenosis.  Since the previous study, the findings at L1-2 are slightly worsened. Posterior decompression at L2-3 is new. Stenosis at L3-4 has worsened. Facet joint effusion at L4-5 has occurred.  IMPRESSION: The dominant finding in this case is that of multifactorial spinal stenosis at the L3-4 level which could cause neural compression on either or both sides. This has worsened considerably since 2011.  L2-3 interval posterior decompression. Some persistent narrowing of the lateral recesses and neural foramina that could possibly be symptomatic.  L1-2 shallow disc protrusion with slight caudal migration. Facet and ligamentous hypertrophy. Mild stenosis of the lateral recesses and foramina but without definite neural compression.  L4-5: Facet joints have fused. 4 mm chronic anterolisthesis. Mild narrowing of the lateral recesses and foramina without definite neural compression.   Electronically Signed   By: Nelson Chimes M.D.   On: 04/01/2018 08:29   PMFS History: Patient Active Problem List   Diagnosis Date Noted  . Abnormal EKG  05/25/2017  . S/P cervical spinal fusion 01/25/2016  . Cervical spinal stenosis 12/20/2015  . Cervical spondylosis 12/20/2015  . Painful orthopaedic hardware left hip with chronic trochanteric bursitis 07/30/2015  . Hip bursitis 07/30/2015  . Trochanteric bursitis of left hip 07/30/2015  . Chest pain with moderate risk of acute coronary syndrome 10/19/2014  . Macular hole, left eye 09/01/2014  . Macular hole of left eye 08/28/2014  . Other pancytopenia ([facility])   . Sliding hiatal hernia   . Gastric erosions   . GI bleeding 06/04/2014  . Acute GI bleeding 06/04/2014  . Orthostatic hypotension 06/04/2014  . Acute upper gastrointestinal bleeding 06/04/2014  . Other specified diabetes mellitus without complications ([facility])   . Acute blood loss anemia   . Thrombocytopenia ([facility])    . Closed left hip fracture () 04/03/2014  . Fracture, femur ([facility]) 04/03/2014  . Femur fracture ([facility]) 04/03/2014  . Upper airway cough syndrome 05/16/2013  . OSA (obstructive sleep apnea) 10/21/2012  . Macular hole 10/08/2012  . Preoperative clearance 09/30/2012  . PAF (paroxysmal atrial fibrillation) ([facility]) 01/21/2012  . Shortness of breath on exertion 01/11/2012  . Bradycardia, sinus 01/11/2012  . Hx of CABG 01/11/2012  . Essential hypertension   . Dyslipidemia   . ABDOMINAL PAIN-LLQ 02/04/2010  . HEMORRHOIDS 08/31/2008  . ESOPHAGEAL STRICTURE 08/31/2008  . GERD 08/31/2008  . Gastritis and gastroduodenitis 08/31/2008  . HIATAL HERNIA 08/31/2008  . DIVERTICULOSIS, COLON 08/31/2008  . Chronic interstitial cystitis 08/31/2008  . COLONIC POLYPS, ADENOMATOUS, HX OF 08/31/2008   Past Medical History:  Diagnosis Date  . Adenomatous colon polyp   . Alpha-1-antitrypsin deficiency ([facility])   . Alpha-1-antitrypsin deficiency carrier   . Anemia   . Anxiety   . Arthritis    "knees; bad in my back" (09/01/2014)  . Bruises easily   . Chronic bronchitis ([facility])    "get it pretty much q yr" (09/01/2014)  . Chronic lower back pain   . Coronary artery disease   . Depression    "related to health" (09/01/2014)  . Diarrhea    has had a part of colon removed  . Diverticulitis   . Diverticulosis   . Dyspnea on exertion   . Dysrhythmia    bradycardia in the 30's (01/11/2012).. Afib briefly after CABG  . Esophageal dysmotility   . Esophageal stricture   . Gastritis   . GERD (gastroesophageal reflux disease)    takes Pantoprazole daily  . Gout   . Hemorrhoids   . Hiatal hernia   . History of colon polyps   . Hyperlipidemia    doesn't require meds  . Hypertension    takes Losartan daily; current not taking   . IC (interstitial cystitis)   . Insomnia    takes Nortrypyline nightly  . Joint pain   . Joint swelling   . Macular hole of both eyes   . Nocturia   . OSA (obstructive sleep  apnea)    "mild; tried CPAP; couldn't tolerate" (09/01/2014)  . Paroxysmal atrial fibrillation ([facility])   . Restless leg syndrome   . Sciatic nerve pain   . Type II diabetes mellitus ([facility])    "borderline"  . Urinary frequency     Family History  Problem Relation Age of Onset  . Emphysema Mother   . Kidney disease Father   . Emphysema Maternal Grandfather   . Colon cancer Neg Hx     Past Surgical History:  Procedure Laterality Date  . [place]  VITRECTOMY WITH 20 GAUGE MVR PORT FOR MACULAR HOLE Right 10/08/2012   Procedure: 25 GAUGE PARS PLANA VITRECTOMY WITH 20 GAUGE MVR PORT FOR MACULAR HOLE; Membrame peel, serum patch; Laser treatment ; Gas exchange;  Surgeon: Hayden Pedro, MD;  Location: [facility];  Service: Ophthalmology;  Laterality: Right;  . 25 GAUGE PARS PLANA VITRECTOMY WITH 20 GAUGE MVR PORT FOR MACULAR HOLE Left 09/01/2014   Procedure: 25 GAUGE PARS PLANA VITRECTOMY WITH 20 GAUGE MVR PORT FOR MACULAR HOLE;  Surgeon: Hayden Pedro, MD;  Location: [facility];  Service: Ophthalmology;  Laterality: Left;  . ANTERIOR CERVICAL DECOMP/DISCECTOMY FUSION N/A 12/20/2015   Procedure: C5-6 Anterior Cervical Discectomy and Fusion, Allograft, and Plate;  Surgeon: [name], MD;  Location: [facility];  Service: Orthopedics;  Laterality: N/A;  . APPENDECTOMY  2009  . bladder lesion removed    . CARDIAC CATHETERIZATION  01/11/2012  . CATARACT EXTRACTION W/ INTRAOCULAR LENS  IMPLANT, BILATERAL Bilateral 2011  . CHOLECYSTECTOMY  2009  . COLONOSCOPY    . COMPRESSION HIP SCREW Left 04/04/2014   Procedure: COMPRESSION HIP LEFT;  Surgeon: Mcarthur Rossetti, MD;  Location: [facility];  Service: Orthopedics;  Laterality: Left;  . CORONARY ANGIOPLASTY    . CORONARY ARTERY BYPASS GRAFT  01/18/2012   Procedure: CORONARY ARTERY BYPASS GRAFTING (CABG);  Surgeon: Ivin Poot, MD;  Location: [facility];  Service: Open Heart Surgery;  Laterality: N/A;  times four, using left internal mammary artery  .  CYSTOSCOPY WITH URETEROSCOPY  2008; 2009   "painted inside of bladder wall"  . ELBOW SURGERY Right 1988   "reconstructive OR"  . ENDOVEIN HARVEST OF GREATER SAPHENOUS VEIN  01/18/2012   Procedure: ENDOVEIN HARVEST OF GREATER SAPHENOUS VEIN;  Surgeon: Ivin Poot, MD;  Location: [facility];  Service: Open Heart Surgery;  Laterality: Right;  . ESOPHAGOGASTRODUODENOSCOPY    . ESOPHAGOGASTRODUODENOSCOPY N/A 06/05/2014   Procedure: ESOPHAGOGASTRODUODENOSCOPY (EGD);  Surgeon: Irene Shipper, MD;  Location: [facility];  Service: Endoscopy;  Laterality: N/A;  . EXCISION/RELEASE BURSA HIP Left 07/30/2015   Procedure: EXCISION/RELEASE BURSA HIP;  Surgeon: Mcarthur Rossetti, MD;  Location: [facility];  Service: Orthopedics;  Laterality: Left;  . EXPLORATORY LAPAROTOMY  2009   with right colectomy  . FRACTURE SURGERY    . GAS INSERTION Left 09/01/2014   Procedure: INSERTION OF GAS;  Surgeon: Hayden Pedro, MD;  Location: [facility];  Service: Ophthalmology;  Laterality: Left;  C3F8  . GAS/FLUID EXCHANGE Left 09/01/2014   Procedure: GAS/FLUID EXCHANGE;  Surgeon: Hayden Pedro, MD;  Location: [facility];  Service: Ophthalmology;  Laterality: Left;  . HARDWARE REMOVAL Left 07/30/2015   Procedure: attempted LEFT HIP HARDWARE REMOVAL, LEFT HIP BURSECTOMY;  Surgeon: Mcarthur Rossetti, MD;  Location: [facility];  Service: Orthopedics;  Laterality: Left;  . HEEL SPUR EXCISION Bilateral 1970's  . INTERTROCHANTERIC HIP FRACTURE SURGERY Left 04/04/2014  . JOINT REPLACEMENT    . LEFT HEART CATH AND CORS/GRAFTS ANGIOGRAPHY N/A 05/28/2017   Procedure: LEFT HEART CATH AND CORS/GRAFTS ANGIOGRAPHY;  Surgeon: Belva Crome, MD;  Location: [facility];  Service: Cardiovascular;  Laterality: N/A;  . LEFT HEART CATHETERIZATION WITH CORONARY ANGIOGRAM N/A 01/11/2012   Procedure: LEFT HEART CATHETERIZATION WITH CORONARY ANGIOGRAM;  Surgeon: Leonie Man, MD;  Location: [facility];  Service: Cardiovascular;  Laterality: N/A;   . LUMBAR [place] SURGERY  ?2010   "cleaned out the stenosis" (01/11/2012)  . MEMBRANE PEEL Left 09/01/2014  Procedure: MEMBRANE PEEL;  Surgeon: Hayden Pedro, MD;  Location: [facility];  Service: Ophthalmology;  Laterality: Left;  . NM MYOCAR MULTIPLE W/SPECT  04/09/2012   NORMAL STRESS NUCLEAR STUDY. EF 51%.  [name] PARS PLANA VITRECTOMY W/ REPAIR OF MACULAR HOLE Left 09/01/2014  . PARTIAL COLECTOMY     d/t diverticulosis  . PHOTOCOAGULATION WITH LASER Left 09/01/2014   Procedure: PHOTOCOAGULATION WITH LASER;  Surgeon: Hayden Pedro, MD;  Location: [facility];  Service: Ophthalmology;  Laterality: Left;  HEADSCOPE LASER  . RECONSTRUCTION MEDIAL COLLATERAL LIGAMENT ELBOW W/ TENDON GRAFT Right 1988 X 2  . SERUM PATCH Left 09/01/2014   Procedure: SERUM PATCH;  Surgeon: Hayden Pedro, MD;  Location: [facility];  Service: Ophthalmology;  Laterality: Left;  . TONSILLECTOMY AND ADENOIDECTOMY  ~1943  . TOTAL KNEE ARTHROPLASTY Bilateral ~ 2001; 2011   right; left  . TRANSTHORACIC ECHOCARDIOGRAM  01/04/2012   MODERATE SEVERE-SEVERE LVH. EF=>55%. MILDLY IMPAIRED LV RELAXATION. LA IS MODERATE TO SEVERE DILATED. MODERATE MR. MV LEAFLETS APPEAR THICHENED.  [name] TUMOR EXCISION Left 12/2011   arm   Social History   Occupational History  . Occupation: Retired    Fish farm manager: RETIRED  Tobacco Use  . Smoking status: Never Smoker  . Smokeless tobacco: Never Used  Substance and Sexual Activity  . Alcohol use: No    Comment: 09/01/2014 "glass of wine a few times/yr; if that"  . Drug use: No  . Sexual activity: Yes

## 2018-04-03 NOTE — Telephone Encounter (Signed)
   [facility] Pre-operative Risk Assessment    Request for surgical clearance:  1. What type of surgery is being performed? Lumbar Decompression   2. When is this surgery scheduled? TBD   3. What type of clearance is required (medical clearance vs. Pharmacy clearance to hold med vs. Both)? Both  4. Are there any medications that need to be held prior to surgery and how long? Aspirin   5. Practice name and name of physician performing surgery? [facility]    6. What is your office phone number [phone]    7.   What is your office fax number [phone] Attn: Jackelyn Poling   8.   Anesthesia type (None, local, MAC, general) ? Unknown    Lucas Morales 04/03/2018, 4:36 PM  _________________________________________________________________   (provider comments below)

## 2018-04-05 ENCOUNTER — Other Ambulatory Visit (INDEPENDENT_AMBULATORY_CARE_PROVIDER_SITE_OTHER): Payer: Self-pay | Admitting: Orthopaedic Surgery

## 2018-04-05 ENCOUNTER — Telehealth (INDEPENDENT_AMBULATORY_CARE_PROVIDER_SITE_OTHER): Payer: Self-pay | Admitting: Orthopaedic Surgery

## 2018-04-05 MED ORDER — HYDROCODONE-ACETAMINOPHEN 5-325 MG PO TABS: 1.0000 | ORAL_TABLET | Freq: Four times a day (QID) | ORAL | 0 refills | Status: DC | PRN

## 2018-04-05 NOTE — Telephone Encounter (Signed)
Still awaiting medical/cardiac clearance from Dr. Gwenlyn Found for this patient. The request was sent 04-03-18. According to Epic, this is in the process of TBD.  Patient has requested that his surgery be in the morning, so February 21st @ 7:30 is being held  Patient is asking if he can be prescribed Hydrocodone (NOW) since he considers the wait for surgery to be long, and considering he is in "excruciating" pain, especially when having to climb the stairs in his home.   He can pick up Rx if prescribed.  He would like for someone to call him if there is a problem with providing him with a Rx.   cb [phone]

## 2018-04-05 NOTE — Telephone Encounter (Signed)
Patient called to check on status of clearance.  

## 2018-04-05 NOTE — Telephone Encounter (Signed)
I called patient's home phone. No answer and no machine picked up. I called patient's mobile and left a voicemail advising

## 2018-04-05 NOTE — Telephone Encounter (Signed)
Please advise on medication

## 2018-04-05 NOTE — Telephone Encounter (Signed)
Patient called wanting to know the status of scheduling his surgery for lumbar decompression with Dr. Lorin Mercy. A request for medica/cardiac clearance has been sent to Dr. Gwenlyn Found is TBD.  I will call patient and schedule upon clearance.

## 2018-04-05 NOTE — Telephone Encounter (Signed)
Sent it in to his pharmacy. [facility] he just sits down when he starts hurting.

## 2018-04-10 NOTE — Telephone Encounter (Signed)
Spoke with patient offered an appt with Dr Gwenlyn Found in February or sooner with APP. Patient agreeable with appt on 04/19/2018 at 2pm with [facility].

## 2018-04-10 NOTE — Telephone Encounter (Signed)
   Primary Cardiologist: Dr Gwenlyn Found  Chart reviewed as part of pre-operative protocol coverage. Because of Dameer L Haworth's past medical history and time since last visit, he/she will require a follow-up visit in order to better assess preoperative cardiovascular risk.   Pre-op covering staff: - Please schedule appointment with Kerin Ransom this week and call patient to inform them. - Please contact requesting surgeon's office via preferred method (i.e, phone, fax) to inform them of need for appointment prior to surgery.  If applicable, this message will also be routed to pharmacy pool and/or primary cardiologist for input on holding anticoagulant/antiplatelet agent as requested below so that this information is available at time of patient's appointment.   Kerin Ransom, PA-C  04/10/2018, 8:33 AM

## 2018-04-15 ENCOUNTER — Ambulatory Visit (INDEPENDENT_AMBULATORY_CARE_PROVIDER_SITE_OTHER): Payer: Medicare Other | Admitting: Orthopaedic Surgery

## 2018-04-19 ENCOUNTER — Ambulatory Visit: Payer: Medicare Other | Admitting: Physician Assistant

## 2018-04-19 ENCOUNTER — Encounter: Payer: Self-pay | Admitting: Physician Assistant

## 2018-04-19 ENCOUNTER — Telehealth (INDEPENDENT_AMBULATORY_CARE_PROVIDER_SITE_OTHER): Payer: Self-pay | Admitting: Radiology

## 2018-04-19 VITALS — BP 116/62 | HR 78 | Ht 74.0 in | Wt 234.4 lb

## 2018-04-19 DIAGNOSIS — I10 Essential (primary) hypertension: Secondary | ICD-10-CM

## 2018-04-19 DIAGNOSIS — I480 Paroxysmal atrial fibrillation: Secondary | ICD-10-CM | POA: Diagnosis not present

## 2018-04-19 DIAGNOSIS — Z01810 Encounter for preprocedural cardiovascular examination: Secondary | ICD-10-CM | POA: Diagnosis not present

## 2018-04-19 DIAGNOSIS — I2510 Atherosclerotic heart disease of native coronary artery without angina pectoris: Secondary | ICD-10-CM | POA: Diagnosis not present

## 2018-04-19 DIAGNOSIS — E785 Hyperlipidemia, unspecified: Secondary | ICD-10-CM

## 2018-04-19 NOTE — Progress Notes (Signed)
Cardiology Office Note    Date:  04/22/2018   ID:  Lucas Morales, DOB [id number], MRN [id number]  PCP:  Deland Pretty, MD  Cardiologist: Dr. Gwenlyn Found  Chief Complaint  Patient presents with  . Pre-op Exam    requested by Dr. Lorin Mercy prior to lumbar spine surgery    History of Present Illness:  Lucas Morales is a 85 y.o. male with PMH of hypertension, hyperlipidemia, CAD s/p CABG, alpha 1 antitrypsin deficiency, obstructive sleep apnea intolerant to CPAP, DM 2, and restless leg syndrome.  He had a Myoview in October 2013 which showed inferolateral ischemia.  This ultimately led to cardiac catheterization revealing two-vessel disease and he underwent CABG x4 by Dr. Darcey Nora in October 2013 with LIMA to LAD, SVG to distal LAD, SVG to OM 3 and OM 4.  Postoperative course was complicated by postop A. fib, he was placed on Coumadin therapy, digoxin and amiodarone.  Last echocardiogram obtained on 01/16/2017 showed EF 55 to 60%, grade 1 DD, moderate MR.  Myoview obtained on 01/17/2017 showed EF 47%, inferior wall hypokinesis in the septal dyskinesis, overall intermediate risk study.  His last cardiac catheterization was performed on 05/28/2017, this revealed patent SVG to PDA, patent LIMA to LAD, occluded sequential SVG to second and third obtuse marginal, total occlusion of proximal to mid LAD.  It was recommended to continue medical therapy.  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  It was recommended to monitor this in the future using a coronary CT angiogram.  Patient presents today for preop clearance requested by Dr. Lorin Mercy of [facility] prior to his lumbar spine surgery.  He denies any recent chest discomfort or shortness of breath.  His functional ability is severely limited not of because of cardiac issue but more because of severe back pain.  His hydrocodone has not been working very well for him, he is requesting to use  oxycodone instead.  I will defer this decision to his primary care provider.  He is having a lot of back pain even during the interview as well.  This is significantly impacting his quality of life at this point.  Given lack of cardiac symptoms, he is cleared to proceed with surgery.  He may hold the aspirin for 5 to 7 days prior to surgery but will need to restart the aspirin after the surgery as soon as possible.  Otherwise his blood pressure is very well controlled on the metoprolol.  I will defer to Dr. Gwenlyn Found to decide when to obtain the coronary CT to evaluate SVG to PDA aneurysm.   Past Medical History:  Diagnosis Date  . Adenomatous colon polyp   . Alpha-1-antitrypsin deficiency ([place])   . Alpha-1-antitrypsin deficiency carrier   . Anemia   . Anxiety   . Arthritis    "knees; bad in my back" (09/01/2014)  . Bruises easily   . Chronic bronchitis ([place])    "get it pretty much q yr" (09/01/2014)  . Chronic lower back pain   . Coronary artery disease   . Depression    "related to health" (09/01/2014)  . Diarrhea    has had a part of colon removed  . Diverticulitis   . Diverticulosis   . Dyspnea on exertion   . Dysrhythmia    bradycardia in the 30's (01/11/2012).. Afib briefly after CABG  . Esophageal dysmotility   . Esophageal stricture   . Gastritis   .  GERD (gastroesophageal reflux disease)    takes Pantoprazole daily  . Gout   . Hemorrhoids   . Hiatal hernia   . History of colon polyps   . Hyperlipidemia    doesn't require meds  . Hypertension    takes Losartan daily; current not taking   . IC (interstitial cystitis)   . Insomnia    takes Nortrypyline nightly  . Joint pain   . Joint swelling   . Macular hole of both eyes   . Nocturia   . OSA (obstructive sleep apnea)    "mild; tried CPAP; couldn't tolerate" (09/01/2014)  . Paroxysmal atrial fibrillation ([facility])   . Restless leg syndrome   . Sciatic nerve pain   . Type II diabetes mellitus ([place])    "borderline"  .  Urinary frequency     Past Surgical History:  Procedure Laterality Date  . [place] VITRECTOMY WITH 20 GAUGE MVR PORT FOR MACULAR HOLE Right 10/08/2012   Procedure: 25 GAUGE PARS PLANA VITRECTOMY WITH 20 GAUGE MVR PORT FOR MACULAR HOLE; Membrame peel, serum patch; Laser treatment ; Gas exchange;  Surgeon: Hayden Pedro, MD;  Location: [place];  Service: Ophthalmology;  Laterality: Right;  . 25 GAUGE PARS PLANA VITRECTOMY WITH 20 GAUGE MVR PORT FOR MACULAR HOLE Left 09/01/2014   Procedure: 25 GAUGE PARS PLANA VITRECTOMY WITH 20 GAUGE MVR PORT FOR MACULAR HOLE;  Surgeon: Hayden Pedro, MD;  Location: [facility];  Service: Ophthalmology;  Laterality: Left;  . ANTERIOR CERVICAL DECOMP/DISCECTOMY FUSION N/A 12/20/2015   Procedure: C5-6 Anterior Cervical Discectomy and Fusion, Allograft, and Plate;  Surgeon: [name], MD;  Location: [facility];  Service: Orthopedics;  Laterality: N/A;  . APPENDECTOMY  2009  . bladder lesion removed    . CARDIAC CATHETERIZATION  01/11/2012  . CATARACT EXTRACTION W/ INTRAOCULAR LENS  IMPLANT, BILATERAL Bilateral 2011  . CHOLECYSTECTOMY  2009  . COLONOSCOPY    . COMPRESSION HIP SCREW Left 04/04/2014   Procedure: COMPRESSION HIP LEFT;  Surgeon: Mcarthur Rossetti, MD;  Location: [facility];  Service: Orthopedics;  Laterality: Left;  . CORONARY ANGIOPLASTY    . CORONARY ARTERY BYPASS GRAFT  01/18/2012   Procedure: CORONARY ARTERY BYPASS GRAFTING (CABG);  Surgeon: Ivin Poot, MD;  Location: [facility];  Service: Open Heart Surgery;  Laterality: N/A;  times four, using left internal mammary artery  . CYSTOSCOPY WITH URETEROSCOPY  2008; 2009   "painted inside of bladder wall"  . ELBOW SURGERY Right 1988   "reconstructive OR"  . ENDOVEIN HARVEST OF GREATER SAPHENOUS VEIN  01/18/2012   Procedure: ENDOVEIN HARVEST OF GREATER SAPHENOUS VEIN;  Surgeon: Ivin Poot, MD;  Location: [facility];  Service: Open Heart Surgery;  Laterality: Right;  . ESOPHAGOGASTRODUODENOSCOPY      . ESOPHAGOGASTRODUODENOSCOPY N/A 06/05/2014   Procedure: ESOPHAGOGASTRODUODENOSCOPY (EGD);  Surgeon: Irene Shipper, MD;  Location: [facility];  Service: Endoscopy;  Laterality: N/A;  . EXCISION/RELEASE BURSA HIP Left 07/30/2015   Procedure: EXCISION/RELEASE BURSA HIP;  Surgeon: Mcarthur Rossetti, MD;  Location: [facility];  Service: Orthopedics;  Laterality: Left;  . EXPLORATORY LAPAROTOMY  2009   with right colectomy  . FRACTURE SURGERY    . GAS INSERTION Left 09/01/2014   Procedure: INSERTION OF GAS;  Surgeon: Hayden Pedro, MD;  Location: [facility];  Service: Ophthalmology;  Laterality: Left;  C3F8  . GAS/FLUID EXCHANGE Left 09/01/2014   Procedure: GAS/FLUID EXCHANGE;  Surgeon: Hayden Pedro, MD;  Location:  [facility];  Service: Ophthalmology;  Laterality: Left;  . HARDWARE REMOVAL Left 07/30/2015   Procedure: attempted LEFT HIP HARDWARE REMOVAL, LEFT HIP BURSECTOMY;  Surgeon: Mcarthur Rossetti, MD;  Location: [facility];  Service: Orthopedics;  Laterality: Left;  . HEEL SPUR EXCISION Bilateral 1970's  . INTERTROCHANTERIC HIP FRACTURE SURGERY Left 04/04/2014  . JOINT REPLACEMENT    . LEFT HEART CATH AND CORS/GRAFTS ANGIOGRAPHY N/A 05/28/2017   Procedure: LEFT HEART CATH AND CORS/GRAFTS ANGIOGRAPHY;  Surgeon: Belva Crome, MD;  Location: [facility];  Service: Cardiovascular;  Laterality: N/A;  . LEFT HEART CATHETERIZATION WITH CORONARY ANGIOGRAM N/A 01/11/2012   Procedure: LEFT HEART CATHETERIZATION WITH CORONARY ANGIOGRAM;  Surgeon: Leonie Man, MD;  Location: [facility];  Service: Cardiovascular;  Laterality: N/A;  . LUMBAR [place] SURGERY  ?2010   "cleaned out the stenosis" (01/11/2012)  . MEMBRANE PEEL Left 09/01/2014   Procedure: MEMBRANE PEEL;  Surgeon: Hayden Pedro, MD;  Location: [facility];  Service: Ophthalmology;  Laterality: Left;  . NM MYOCAR MULTIPLE W/SPECT  04/09/2012   NORMAL STRESS NUCLEAR STUDY. EF 51%.  [name] PARS PLANA VITRECTOMY W/ REPAIR OF MACULAR HOLE Left 09/01/2014   . PARTIAL COLECTOMY     d/t diverticulosis  . PHOTOCOAGULATION WITH LASER Left 09/01/2014   Procedure: PHOTOCOAGULATION WITH LASER;  Surgeon: Hayden Pedro, MD;  Location: [facility];  Service: Ophthalmology;  Laterality: Left;  HEADSCOPE LASER  . RECONSTRUCTION MEDIAL COLLATERAL LIGAMENT ELBOW W/ TENDON GRAFT Right 1988 X 2  . SERUM PATCH Left 09/01/2014   Procedure: SERUM PATCH;  Surgeon: Hayden Pedro, MD;  Location: [facility];  Service: Ophthalmology;  Laterality: Left;  . TONSILLECTOMY AND ADENOIDECTOMY  ~1943  . TOTAL KNEE ARTHROPLASTY Bilateral ~ 2001; 2011   right; left  . TRANSTHORACIC ECHOCARDIOGRAM  01/04/2012   MODERATE SEVERE-SEVERE LVH. EF=>55%. MILDLY IMPAIRED LV RELAXATION. LA IS MODERATE TO SEVERE DILATED. MODERATE MR. MV LEAFLETS APPEAR THICHENED.  [name] TUMOR EXCISION Left 12/2011   arm    Current Medications: Outpatient Medications Prior to Visit  Medication Sig Dispense Refill  . allopurinol (ZYLOPRIM) 300 MG tablet Take 300 mg by mouth daily.  2  . aspirin EC 81 MG tablet Take 81 mg by mouth daily.    [name] atorvastatin (LIPITOR) 40 MG tablet TAKE 1 TABLET(40 MG) BY MOUTH DAILY AT 6 PM 30 tablet 7  . calcium carbonate (TUMS EX) 750 MG chewable tablet Chew 6-7 tablets by mouth daily as needed for heartburn.     [name] FIBER SELECT GUMMIES PO Take 3-4 tablets by mouth daily as needed (fiber).     [name] HYDROcodone-acetaminophen (NORCO/VICODIN) 5-325 MG tablet Take 1 tablet by mouth every 6 (six) hours as needed for moderate pain. 30 tablet 0  . loperamide (IMODIUM) 2 MG capsule Take 6 mg by mouth daily as needed for diarrhea or loose stools.     . metoprolol succinate (TOPROL-XL) 25 MG 24 hr tablet Take 25 mg by mouth daily.  12  . Misc Natural Products (APPLE CIDER VINEGAR) TABS Take 1 tablet by mouth 2 (two) times daily.    . Multiple Vitamins-Minerals (MULTIVITAMIN WITH MINERALS) tablet Take 1 tablet by mouth daily.      [name] OVER THE COUNTER MEDICATION Apply 1 application topically daily  as needed (For fungal infection.). Ciclopirox 8% Topical Solution    . pantoprazole (PROTONIX) 40 MG tablet Take 1 tablet (40 mg total) by mouth 2 (two) times daily. 60 tablet 3  .  tadalafil (CIALIS) 5 MG tablet Take 5 mg by mouth daily. For interstitial cystitis.    . naproxen (NAPROSYN) 500 MG tablet TAKE 1 TABLET BY MOUTH UP TO TWICE DAILY WITH FOOD AS NEEDED FOR INFLAMMATION OR PAIN 60 tablet 0   No facility-administered medications prior to visit.      Allergies:   Morphine   Social History   Socioeconomic History  . Marital status: Married    Spouse name: Not on file  . Number of children: 2  . Years of education: Not on file  . Highest education level: Not on file  Occupational History  . Occupation: Retired    Fish farm manager: RETIRED  Social Needs  . Financial resource strain: Not on file  . Food insecurity:    Worry: Not on file    Inability: Not on file  . Transportation needs:    Medical: Not on file    Non-medical: Not on file  Tobacco Use  . Smoking status: Never Smoker  . Smokeless tobacco: Never Used  Substance and Sexual Activity  . Alcohol use: No    Comment: 09/01/2014 "glass of wine a few times/yr; if that"  . Drug use: No  . Sexual activity: Yes  Lifestyle  . Physical activity:    Days per week: Not on file    Minutes per session: Not on file  . Stress: Not on file  Relationships  . Social connections:    Talks on phone: Not on file    Gets together: Not on file    Attends religious service: Not on file    Active member of club or organization: Not on file    Attends meetings of clubs or organizations: Not on file    Relationship status: Not on file  Other Topics Concern  . Not on file  Social History Narrative  . Not on file     Family History:  The patient's family history includes Emphysema in his maternal grandfather and mother; Kidney disease in his father.   ROS:   Please see the history of present illness.    ROS All other systems  reviewed and are negative.   PHYSICAL EXAM:   VS:  BP 116/62   Pulse 78   Ht 6\' 2"  (1.88 m)   Wt 234 lb 6.4 oz (106.3 kg)   BMI 30.10 kg/m    GEN: Well nourished, well developed, in no acute distress  HEENT: normal  Neck: no JVD, carotid bruits, or masses Cardiac: RRR; no murmurs, rubs, or gallops,no edema  Respiratory:  clear to auscultation bilaterally, normal work of breathing GI: soft, nontender, nondistended, + BS MS: no deformity or atrophy  Skin: warm and dry, no rash Neuro:  Alert and Oriented x 3, Strength and sensation are intact Psych: euthymic mood, full affect  Wt Readings from Last 3 Encounters:  04/19/18 234 lb 6.4 oz (106.3 kg)  04/03/18 240 lb (108.9 kg)  01/29/18 240 lb (108.9 kg)      Studies/Labs Reviewed:   EKG:  EKG is ordered today.  The ekg ordered today demonstrates normal sinus rhythm with occasional PVC  Recent Labs: 05/25/2017: BUN 23; Creatinine, Ser 1.13; Hemoglobin 13.0; Platelets 163; Potassium 4.2; Sodium 142   Lipid Panel    Component Value Date/Time   CHOL 89 04/04/2014 0502   TRIG 214 (H) 04/04/2014 0502   HDL 23 (L) 04/04/2014 0502   CHOLHDL 3.9 04/04/2014 0502   VLDL 43 (H) 04/04/2014 0502   LDLCALC 23  04/04/2014 0502    Additional studies/ records that were reviewed today include:   Echo 01/16/2017 LV EF: 55% -   60% Study Conclusions  - Left ventricle: The cavity size was normal. Wall thickness was   increased in a pattern of moderate LVH. Systolic function was   normal. The estimated ejection fraction was in the range of 55%   to 60%. Wall motion was normal; there were no regional wall   motion abnormalities. Doppler parameters are consistent with   abnormal left ventricular relaxation (grade 1 diastolic   dysfunction). - Aortic valve: Trileaflet; mildly thickened, mildly calcified   leaflets. - Mitral valve: Calcified annulus. There was moderate   regurgitation. - Left atrium: The atrium was moderately dilated.  Volume/bsa, S:   43.8 ml/m^2.   Myoview 01/17/2017 Study Highlights     The left ventricular ejection fraction is mildly decreased (45-54%).  Nuclear stress EF: 47%.  Blood pressure demonstrated a hypertensive response to exercise.  No T wave inversion was noted during stress.  There was no ST segment deviation noted during stress.  This is an intermediate risk study.   No reversible ischemia. LVEF 47% with inferior wall hypokinesis and septal dyskinesis. This is an intermediate risks study. Prominent RV uptake is noted, suggestive of elevated pulmonary pressure. Echocardiographic correlation of LVEF is recommended if not already performed.    Cath 05/28/2017  Occlusion of sequential saphenous vein graft to the distal obtuse marginal branches.  The vein graft connection between the 2 marginals remains patent.  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  TIMI grade III flow was noted.  Distal anastomosis is widely patent.  The PDA is occluded proximal to the graft insertion site.  Patent LIMA to the LAD.  Occluded sequential saphenous vein graft to the second and third obtuse marginal.  The saphenous vein graft communication between the obtuse marginals remains patent.  Normal left main  Total occlusion of the proximal to mid LAD.  40-50% mid circumflex stenosis, 80% stenosis in the second obtuse marginal proximal to the graft insertion site, and widely patent third obtuse marginal.  As mentioned above, the graft segment between the marginals is still patent.  Total occlusion of the PDA with diffuse 40% stenosis in the proximal to mid vessel.  Normal LV function.  RECOMMENDATIONS:   Continue medical therapy.  Monitor fusiform aneurysm formation and saphenous vein graft for progressive enlargement (perhaps by coronary CT angio).  Stenting of the second obtuse marginal would be possible from the  femoral or right radial approach.  Would avoid left radial approach in the future.  Not sure the marginal needs to be treated since the bypass graft connecting the 2 marginals remains widely patent.  ASSESSMENT:    1. Preoperative cardiovascular examination   2. Essential hypertension   3. PAF (paroxysmal atrial fibrillation) ([place])   4. Coronary artery disease involving native coronary artery of native heart without angina pectoris   5. Hyperlipidemia, unspecified hyperlipidemia type      PLAN:  In order of problems listed above:  1. Preoperative clearance: Upcoming surgery of the spine by Dr. Lorin Mercy.  Last cardiac catheterization was in March 2019, medical therapy was recommended.  Patient denies any recent significant chest discomfort.  He is cleared to proceed with surgery without further work-up.  He may hold aspirin for 5 to 7 days prior to the surgery and restart as soon as possible after  the surgery.    2. CAD: Stable on last cardiac catheterization in March 2019.  Patient noted to have a large fusiform aneurysm in the proximal and the distal segment of SVG to PDA.  Will defer to Dr. Gwenlyn Found to consider coronary CT at some point to monitor this fusiform aneurysm  3. Postop atrial fibrillation: Occurred in the postop setting after bypass surgery.  He was on Coumadin at one point, he has been off of Coumadin since.  No recurrence of atrial fibrillation.  4. Hyperlipidemia: On Lipitor 40 mg daily  5. Hypertension: Blood pressure stable on current therapy    Medication Adjustments/Labs and Tests Ordered: Current medicines are reviewed at length with the patient today.  Concerns regarding medicines are outlined above.  Medication changes, Labs and Tests ordered today are listed in the Patient Instructions below. Patient Instructions  Medication Instructions:  Your physician recommends that you continue on your current medications as directed. Please refer to the Current Medication list  given to you today.  If you need a refill on your cardiac medications before your next appointment, please call your pharmacy.    Follow-Up: At [facility], you and your health needs are our priority.  As part of our continuing mission to provide you with exceptional heart care, we have created designated Provider Care Teams.  These Care Teams include your primary Cardiologist (physician) and Advanced Practice Providers (APPs -  Physician Assistants and Nurse Practitioners) who all work together to provide you with the care you need, when you need it. You will need a follow up appointment in 6 months with Dr. Gwenlyn Found or one of the following Advanced Practice Providers on your designated Care Team:   Kerin Ransom, PA-C Roby Lofts, [place] . Sande Rives, PA-C  Please call 2 months ahead of time to schedule your appointment.  Any Other Special Instructions Will Be Listed Below (If Applicable). None      Hilbert Corrigan, [place]  04/22/2018 12:38 AM    [facility] [address], [place], [place]  [address] Phone: [phone]; Fax: [phone]

## 2018-04-19 NOTE — Telephone Encounter (Signed)
noted 

## 2018-04-19 NOTE — Patient Instructions (Signed)
Medication Instructions:  Your physician recommends that you continue on your current medications as directed. Please refer to the Current Medication list given to you today.  If you need a refill on your cardiac medications before your next appointment, please call your pharmacy.    Follow-Up: At [facility], you and your health needs are our priority.  As part of our continuing mission to provide you with exceptional heart care, we have created designated Provider Care Teams.  These Care Teams include your primary Cardiologist (physician) and Advanced Practice Providers (APPs -  Physician Assistants and Nurse Practitioners) who all work together to provide you with the care you need, when you need it. You will need a follow up appointment in 6 months with Dr. Gwenlyn Found or one of the following Advanced Practice Providers on your designated Care Team:   Kerin Ransom, PA-C Roby Lofts, [place] . Sande Rives, PA-C  Please call 2 months ahead of time to schedule your appointment.  Any Other Special Instructions Will Be Listed Below (If Applicable). None

## 2018-04-19 NOTE — Telephone Encounter (Signed)
Received letter from Lucas Morales requesting pain medication until he can have his surgery.  Dr. Lorin Mercy called patient's cell and home numbers with no answer.

## 2018-04-19 NOTE — Telephone Encounter (Signed)
I called both phone number and cell phone.  Left message on his cell phone.  I gave him Norco 30 tablets before after long discussion telling him that when he has the pain he can sit or lay down and that will give him relief since he has neurogenic claudication.  I explained to him previously that there is no pain medication strong enough that he can continue to stand and walk distance when he has  spinal stenosis.  If cancellation we can move up his surgery date.  Patient had written me a note stating that he was in severe pain and suffering and states that it was as bad as his knee replacement after surgery.  His letter states that 5 weeks from diagnosis to operation is in agonizing attorney D under this condition.  I discussed with him taking strong pain medication for several weeks before surgery will make the pain medication less effective after surgery and that for the condition he has if he stops and sits down or leans forward at the waist in flexion onto his elbows or if he lays down he will get relief.  Repeat because this was not successful. FYI

## 2018-04-22 ENCOUNTER — Encounter: Payer: Self-pay | Admitting: Physician Assistant

## 2018-04-23 ENCOUNTER — Other Ambulatory Visit (INDEPENDENT_AMBULATORY_CARE_PROVIDER_SITE_OTHER): Payer: Self-pay | Admitting: Orthopaedic Surgery

## 2018-04-23 MED ORDER — HYDROCODONE-ACETAMINOPHEN 5-325 MG PO TABS: 1.0000 | ORAL_TABLET | ORAL | 0 refills | Status: DC | PRN

## 2018-04-23 NOTE — Progress Notes (Signed)
Patient requested refill of Norco.  He states Percocet actually work better for him but I discussed with him he is better not to take as much pain medicine before his procedure which is several weeks away since he is going on a trip to [place].  Norco refilled #30.

## 2018-04-24 ENCOUNTER — Telehealth (INDEPENDENT_AMBULATORY_CARE_PROVIDER_SITE_OTHER): Payer: Self-pay | Admitting: Orthopaedic Surgery

## 2018-04-24 NOTE — Telephone Encounter (Signed)
He can call us back if he wants to get doppler. thanks

## 2018-04-24 NOTE — Telephone Encounter (Signed)
Patient is scheduled for lumbar decompression with Dr Lorin Mercy  Feb 21 @7 :30am. Clearance was received from Cardiologist-Dr. Gwenlyn Found.  Patient has a H&P with Jeneen Rinks Feb 13th @ 1:30. Patient is concerned because his locus of pain has shifted from his BACK to the left side of his RIGHT KNEE. He is concerned that it could be a blood clot.   cb [phone] [phone]

## 2018-04-24 NOTE — Telephone Encounter (Signed)
Please advise 

## 2018-04-24 NOTE — Telephone Encounter (Signed)
I called patient to advise we were ordering doppler. He states that he does not feel that it is a blood clot and that the pain he had at his knee is gone. He does not feel that we need a doppler. He thinks that it is just the pain from his back moving all around.  No orders entered.  Please advise if you would still like to order.

## 2018-04-24 NOTE — Telephone Encounter (Signed)
Ok to get a doppler rule out DVT

## 2018-04-25 NOTE — Telephone Encounter (Signed)
noted 

## 2018-05-02 ENCOUNTER — Ambulatory Visit (INDEPENDENT_AMBULATORY_CARE_PROVIDER_SITE_OTHER): Payer: Medicare Other | Admitting: Surgery

## 2018-05-02 ENCOUNTER — Encounter (INDEPENDENT_AMBULATORY_CARE_PROVIDER_SITE_OTHER): Payer: Self-pay | Admitting: Surgery

## 2018-05-02 VITALS — BP 139/67 | HR 50 | Temp 97.4°F | Ht 74.0 in | Wt 230.0 lb

## 2018-05-02 DIAGNOSIS — M4807 Spinal stenosis, lumbosacral region: Secondary | ICD-10-CM

## 2018-05-02 DIAGNOSIS — R0602 Shortness of breath: Secondary | ICD-10-CM

## 2018-05-02 MED ORDER — HYDROCODONE-ACETAMINOPHEN 5-325 MG PO TABS: 1.0000 | ORAL_TABLET | Freq: Three times a day (TID) | ORAL | 0 refills | Status: DC | PRN

## 2018-05-02 NOTE — Progress Notes (Signed)
Patient with history of L3-4 stenosis comes in for preop evaluation.  Scheduled for L3-4 decompression May 10, 2018.  States that lumbar symptoms are unchanged from last office visit with Dr. Lorin Mercy.  He is wanting to proceed with surgery as scheduled.  Patient was cleared by cardiology April 19, 2018.  He comes in today with increased dyspnea.  After being in the room for several minutes before I entered patient was still having shortness of breath at rest.  He stated that it was related to his pain.  After being cleared by cardiology patient called our office April 24, 2018 stating that he was having pain behind the right knee and he was concerned that he might have a blood clot.  Dr. Lorin Mercy had plan to order a venous Doppler to rule out DVT but then it is documented in record that patient did not want the Doppler because he did not think it was a blood clot.  My attending Dr. Lorin Mercy is not in the clinic this afternoon.  I did contact him and advised him of patient's visit.  He stated that this did not appear to be patient's baseline here in our office and that he should be seen by his physician before schedule surgery next week.  We contacted cardiology office I did speak with PA who agreed to see patient 4 PM next Monday.  Patient advised.

## 2018-05-02 NOTE — Pre-Procedure Instructions (Addendum)
Zekiel L Blow  0/09[birth year]1217      [id number] - [place], [address] AT [facility] OF [address] & [address] [place] [address] Phone: [phone] Fax: [phone]    Your procedure is scheduled on Fri., Feb. 21, 2020 from 7:30AM-9:33AM  Report to [facility] Entrance "A" at 5:30AM  Call this number if you have problems the morning of surgery:  [phone]   Remember:  Do not eat or drink after midnight on Feb. 20th    Take these medicines the morning of surgery with A SIP OF WATER: Allopurinol (ZYLOPRIM), Metoprolol succinate (TOPROL-XL), and Pantoprazole (PROTONIX)  If needed: HYDROcodone-acetaminophen (NORCO/VICODIN) and Loperamide (IMODIUM)  Take last dose of Aspirin on 2/13, per the cardiologist  As of today, stop taking all Other Aspirin Products, Vitamins, Fish oils, and Herbal medications. Also stop all NSAIDS i.e. Advil, Ibuprofen, Motrin, Aleve, Anaprox, Naproxen, BC, Goody Powders, and all Supplements.   How to Manage Your Diabetes Before and After Surgery  Why is it important to control my blood sugar before and after surgery? . Improving blood sugar levels before and after surgery helps healing and can limit problems. . A way of improving blood sugar control is eating a healthy diet by: o  Eating less sugar and carbohydrates o  Increasing activity/exercise o  Talking with your doctor about reaching your blood sugar goals . High blood sugars (greater than 180 mg/dL) can raise your risk of infections and slow your recovery, so you will need to focus on controlling your diabetes during the weeks before surgery. . Make sure that the doctor who takes care of your diabetes knows about your planned surgery including the date and location.  How do I manage my blood sugar before surgery? . Check your blood sugar at least 4 times a day, starting 2 days before surgery, to make sure that the level is not too high or  low. o Check your blood sugar the morning of your surgery when you wake up and every 2 hours until you get to the Short Stay unit. . If your blood sugar is less than 70 mg/dL, you will need to treat for low blood sugar: o Do not take insulin. o Treat a low blood sugar (less than 70 mg/dL) with  cup of clear juice (cranberry or apple), 4 glucose tablets, OR glucose gel. Recheck blood sugar in 15 minutes after treatment (to make sure it is greater than 70 mg/dL). If your blood sugar is not greater than 70 mg/dL on recheck, call [phone] o  for further instructions. .  If your CBG is greater than 220 mg/dL, inform the staff upon arrival to Short Stay.  . If you are admitted to the hospital after surgery: o Your blood sugar will be checked by the staff and you will probably be given insulin after surgery (instead of oral diabetes medicines) to make sure you have good blood sugar levels. o The goal for blood sugar control after surgery is 80-180 mg/dL.  Reviewed and Endorsed by [facility] Patient Education Committee, August 2015   Do not wear jewelry.  Do not wear lotions, powders, colognes, or deodorant.  Do not shave 48 hours prior to surgery.  Men may shave face.  Do not bring valuables to the hospital.  [facility] is not responsible for any belongings or valuables.  Contacts, dentures or bridgework may not be worn into surgery.  Leave your suitcase  in the car.  After surgery it may be brought to your room.  For patients admitted to the hospital, discharge time will be determined by your treatment team.  Patients discharged the day of surgery will not be allowed to drive home.   Special instructions:   [place]- Preparing For Surgery  Before surgery, you can play an important role. Because skin is not sterile, your skin needs to be as free of germs as possible. You can reduce the number of germs on your skin by washing with CHG (chlorahexidine gluconate) Soap before surgery.   CHG is an antiseptic cleaner which kills germs and bonds with the skin to continue killing germs even after washing.    Oral Hygiene is also important to reduce your risk of infection.  Remember - BRUSH YOUR TEETH THE MORNING OF SURGERY WITH YOUR REGULAR TOOTHPASTE  Please do not use if you have an allergy to CHG or antibacterial soaps. If your skin becomes reddened/irritated stop using the CHG.  Do not shave (including legs and underarms) for at least 48 hours prior to first CHG shower. It is OK to shave your face.  Please follow these instructions carefully.   1. Shower the NIGHT BEFORE SURGERY and the MORNING OF SURGERY with CHG.   2. If you chose to wash your hair, wash your hair first as usual with your normal shampoo.  3. After you shampoo, rinse your hair and body thoroughly to remove the shampoo.  4. Use CHG as you would any other liquid soap. You can apply CHG directly to the skin and wash gently with a scrungie or a clean washcloth.   5. Apply the CHG Soap to your body ONLY FROM THE NECK DOWN.  Do not use on open wounds or open sores. Avoid contact with your eyes, ears, mouth and genitals (private parts). Wash Face and genitals (private parts)  with your normal soap.  6. Wash thoroughly, paying special attention to the area where your surgery will be performed.  7. Thoroughly rinse your body with warm water from the neck down.  8. DO NOT shower/wash with your normal soap after using and rinsing off the CHG Soap.  9. Pat yourself dry with a CLEAN TOWEL.  10. Wear CLEAN PAJAMAS to bed the night before surgery, wear comfortable clothes the morning of surgery  11. Place CLEAN SHEETS on your bed the night of your first shower and DO NOT SLEEP WITH PETS.  Day of Surgery:  Do not apply any deodorants/lotions.  Please wear clean clothes to the hospital/surgery center.   Remember to brush your teeth WITH YOUR REGULAR TOOTHPASTE.  Please read over the following fact sheets that  you were given. Pain Booklet, Coughing and Deep Breathing, MRSA Information and Surgical Site Infection Prevention

## 2018-05-03 ENCOUNTER — Encounter (HOSPITAL_COMMUNITY)
Admission: RE | Admit: 2018-05-03 | Discharge: 2018-05-03 | Disposition: A | Discharge status: Home / Self Care | Payer: Medicare Other | Source: Ambulatory Visit | Attending: Orthopaedic Surgery | Admitting: Orthopaedic Surgery

## 2018-05-03 ENCOUNTER — Encounter (HOSPITAL_COMMUNITY): Payer: Self-pay

## 2018-05-03 ENCOUNTER — Encounter (HOSPITAL_COMMUNITY)
Admission: RE | Admit: 2018-05-03 | Discharge: 2018-05-03 | Disposition: A | Discharge status: Home / Self Care | Payer: Medicare Other | Source: Ambulatory Visit | Attending: Surgery | Admitting: Surgery

## 2018-05-03 ENCOUNTER — Other Ambulatory Visit: Payer: Self-pay

## 2018-05-03 DIAGNOSIS — I10 Essential (primary) hypertension: Secondary | ICD-10-CM | POA: Insufficient documentation

## 2018-05-03 DIAGNOSIS — E119 Type 2 diabetes mellitus without complications: Secondary | ICD-10-CM | POA: Diagnosis not present

## 2018-05-03 DIAGNOSIS — I700 Atherosclerosis of aorta: Secondary | ICD-10-CM | POA: Diagnosis not present

## 2018-05-03 DIAGNOSIS — K449 Diaphragmatic hernia without obstruction or gangrene: Secondary | ICD-10-CM | POA: Insufficient documentation

## 2018-05-03 DIAGNOSIS — Z01818 Encounter for other preprocedural examination: Secondary | ICD-10-CM | POA: Diagnosis present

## 2018-05-03 LAB — URINALYSIS, ROUTINE W REFLEX MICROSCOPIC
BILIRUBIN URINE: NEGATIVE
Bacteria, UA: NONE SEEN
Glucose, UA: NEGATIVE mg/dL
Hgb urine dipstick: NEGATIVE
KETONES UR: NEGATIVE mg/dL
LEUKOCYTE UA: NEGATIVE
NITRITE: NEGATIVE
Protein, ur: NEGATIVE mg/dL
Specific Gravity, Urine: 1.027 (ref 1.005–1.030)
pH: 5 (ref 5.0–8.0)

## 2018-05-03 LAB — CBC
HCT: 41 % (ref 39.0–52.0)
Hemoglobin: 13.5 g/dL (ref 13.0–17.0)
MCH: 31.6 pg (ref 26.0–34.0)
MCHC: 32.9 g/dL (ref 30.0–36.0)
MCV: 96 fL (ref 80.0–100.0)
Platelets: 240 10*3/uL (ref 150–400)
RBC: 4.27 MIL/uL (ref 4.22–5.81)
RDW: 14.1 % (ref 11.5–15.5)
WBC: 7.4 10*3/uL (ref 4.0–10.5)
nRBC: 0 % (ref 0.0–0.2)

## 2018-05-03 LAB — COMPREHENSIVE METABOLIC PANEL
ALT: 34 U/L (ref 0–44)
AST: 39 U/L (ref 15–41)
Albumin: 3.9 g/dL (ref 3.5–5.0)
Alkaline Phosphatase: 221 U/L — ABNORMAL HIGH (ref 38–126)
Anion gap: 12 (ref 5–15)
BUN: 23 mg/dL (ref 8–23)
CO2: 20 mmol/L — ABNORMAL LOW (ref 22–32)
Calcium: 9.8 mg/dL (ref 8.9–10.3)
Chloride: 106 mmol/L (ref 98–111)
Creatinine, Ser: 1.15 mg/dL (ref 0.61–1.24)
GFR calc non Af Amer: 59 mL/min — ABNORMAL LOW (ref >60)
Glucose, Bld: 108 mg/dL — ABNORMAL HIGH (ref 70–99)
POTASSIUM: 4.1 mmol/L (ref 3.5–5.1)
SODIUM: 138 mmol/L (ref 135–145)
Total Bilirubin: 0.9 mg/dL (ref 0.3–1.2)
Total Protein: 7.1 g/dL (ref 6.5–8.1)

## 2018-05-03 LAB — HEMOGLOBIN A1C
Hgb A1c MFr Bld: 6.8 % — ABNORMAL HIGH (ref 4.8–5.6)
Mean Plasma Glucose: 148.46 mg/dL

## 2018-05-03 LAB — SURGICAL PCR SCREEN
MRSA, PCR: NEGATIVE
STAPHYLOCOCCUS AUREUS: NEGATIVE

## 2018-05-03 LAB — GLUCOSE, CAPILLARY: Glucose-Capillary: 136 mg/dL — ABNORMAL HIGH (ref 70–99)

## 2018-05-03 NOTE — Progress Notes (Signed)
PCP - Dr. Audie Pinto  Cardiologist - Dr. Gwenlyn Found PA Janan Ridge- C.C. 1/31  Chest x-ray - 05/03/2018  EKG - 04/19/2018 (E)  Stress Test - 01/17/17 (E)  ECHO - 01/16/17 (E)  Cardiac Cath - 05/28/17 (E)  AICD-na PM-na LOOP-na  Sleep Study - Yes- Positive CPAP - None  LABS- 05/03/2018: CBC, CMP, UA, PCR  ASA-LD- 2/14  HA1C- 05/03/2018 Fasting Blood Sugar - 107-162, Today 136  Checks Blood Sugar _1____ times a day  Pt sts he as a "jock-itch" rash to both groins, due to imobility. He sts he has been using an otc antibiotic ointment, but it is not going away. Pt advised to inform the surgeon, as well as his pcp for evaluation and treatment prior to surgery.  Anesthesia- Yes-cardiac history  Pt denies having chest pain, sob, or fever at this time. All instructions explained to the pt, with a verbal understanding of the material. Pt agrees to go over the instructions while at home for a better understanding. The opportunity to ask questions was provided.

## 2018-05-06 ENCOUNTER — Telehealth (INDEPENDENT_AMBULATORY_CARE_PROVIDER_SITE_OTHER): Payer: Self-pay | Admitting: Orthopaedic Surgery

## 2018-05-06 ENCOUNTER — Encounter: Payer: Self-pay | Admitting: Physician Assistant

## 2018-05-06 ENCOUNTER — Ambulatory Visit: Payer: Medicare Other | Admitting: Physician Assistant

## 2018-05-06 VITALS — BP 140/58 | HR 103 | Ht 74.0 in | Wt 224.0 lb

## 2018-05-06 DIAGNOSIS — I498 Other specified cardiac arrhythmias: Secondary | ICD-10-CM

## 2018-05-06 DIAGNOSIS — I2510 Atherosclerotic heart disease of native coronary artery without angina pectoris: Secondary | ICD-10-CM | POA: Diagnosis not present

## 2018-05-06 DIAGNOSIS — I480 Paroxysmal atrial fibrillation: Secondary | ICD-10-CM

## 2018-05-06 DIAGNOSIS — Z01818 Encounter for other preprocedural examination: Secondary | ICD-10-CM

## 2018-05-06 DIAGNOSIS — I10 Essential (primary) hypertension: Secondary | ICD-10-CM

## 2018-05-06 DIAGNOSIS — R0600 Dyspnea, unspecified: Secondary | ICD-10-CM

## 2018-05-06 DIAGNOSIS — I499 Cardiac arrhythmia, unspecified: Secondary | ICD-10-CM

## 2018-05-06 DIAGNOSIS — E785 Hyperlipidemia, unspecified: Secondary | ICD-10-CM

## 2018-05-06 MED ORDER — METOPROLOL SUCCINATE ER 50 MG PO TB24: 50.0000 mg | ORAL_TABLET | Freq: Every day | ORAL | 3 refills | Status: AC

## 2018-05-06 NOTE — Telephone Encounter (Signed)
Please advise 

## 2018-05-06 NOTE — Progress Notes (Signed)
Cardiology Office Note    Date:  05/06/2018   ID:  Lucas Morales, DOB [birth year], MRN [id number]  PCP:  Deland Pretty, MD  Cardiologist:  Dr. Gwenlyn Found  Chief Complaint  Patient presents with  . Pre-op Exam    upcoming back surgery by Dr. Lorin Mercy    History of Present Illness:  Lucas Morales is a 85 y.o. male with PMH of HTN, HLD, CAD s/p CABG, alpha 1 antitrypsin deficiency, OSA intolerant to CPAP, DM 2, and restless leg syndrome.  He had a Myoview in October 2013 which showed inferolateral ischemia.  This ultimately led to cardiac catheterization revealing two-vessel disease and he underwent CABG x4 by Dr. Darcey Nora in October 2013 with LIMA to LAD, SVG to distal LAD, SVG to OM 3 and OM 4.  Postoperative course was complicated by postop A. fib, he was placed on Coumadin therapy, digoxin and amiodarone.  Last echocardiogram obtained on 01/16/2017 showed EF 55 to 60%, grade 1 DD, moderate MR.  Myoview obtained on 01/17/2017 showed EF 47%, inferior wall hypokinesis in the septal dyskinesis, overall intermediate risk study.  His last cardiac catheterization was performed on 05/28/2017, this revealed patent SVG to PDA, patent LIMA to LAD, occluded sequential SVG to second and third obtuse marginal, total occlusion of proximal to mid LAD.  It was recommended to continue medical therapy.  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  It was recommended to monitor this in the future using a coronary CT angiogram.  Patient presents today for cardiology office visit.  He went for preadmission testing last week and had episode of shortness of breath after walking a long distance.  He is having severe lower back pain.  He did complain of a single painful spot in the right knee, this is focal and located anterior-medial to the knee instead of near the back like most DVT.  There was some potential suspicion for blood clot, however patient refused  venous Doppler.  The preadmission PA has discussed the case with me over the phone and the recommended reevaluation.  Patient was added on for today's visit for reevaluation of preop clearance.  On arrival, we did ambulatory O2.  His O2 saturation is 95% at the start of the 2 laps, during walk, O2 saturation dipped down to 89%.  He never needed oxygen supplement during the walk.  He did have some shortness of breath immediately after the walk, however quickly recovered.  His blood pressure and heart rate are stable.  Interestingly, with walking, his heart rate is actually lower than resting heart rate.  We got a EKG immediately after he sat down, this shows that the patient has went into ventricular bigeminy which likely account for the drop in the heart rate with walking.  In order to prevent him from having further ventricular episode, I will increase his metoprolol to 50 mg daily.  His knee pain was very atypical for blood clot.  He says his symptom of shortness of breath only occurred for that 1 day however has not recurred since.  Given stable O2 saturation with walking, our suspicion for DVT and PE is relatively low at this time.  Patient appears to be very uncomfortable during today's interview and constantly moving about due to significant back pain.  I discussed issue with DOD Dr. Peter Martinique, we recommend the patient to proceed with back surgery.  At this time, I do not  recommend additional work-up and suspicion for blood clot is low.  However if orthopedic group still think PE is a possibility, a CT angiogram of the chest can be ordered.   Resting O2 saturation 95%, heart rate 74 bpm Walking O2 saturation 89%, heart rate 60 bpm  Past Medical History:  Diagnosis Date  . Adenomatous colon polyp   . Alpha-1-antitrypsin deficiency ([place])   . Alpha-1-antitrypsin deficiency carrier   . Anemia   . Anxiety   . Arthritis    "knees; bad in my back" (09/01/2014)  . Bruises easily   . Chronic  bronchitis ([place])    "get it pretty much q yr" (09/01/2014)  . Chronic lower back pain   . Coronary artery disease   . Depression    "related to health" (09/01/2014)  . Diarrhea    has had a part of colon removed  . Diverticulitis   . Diverticulosis   . Dyspnea on exertion   . Dysrhythmia    bradycardia in the 30's (01/11/2012).. Afib briefly after CABG  . Esophageal dysmotility   . Esophageal stricture   . Gastritis   . GERD (gastroesophageal reflux disease)    takes Pantoprazole daily  . Gout   . Hemorrhoids   . Hiatal hernia   . History of colon polyps   . Hyperlipidemia    doesn't require meds  . Hypertension    takes Losartan daily; current not taking   . IC (interstitial cystitis)   . Insomnia    takes Nortrypyline nightly  . Joint pain   . Joint swelling   . Macular hole of both eyes   . Nocturia   . OSA (obstructive sleep apnea)    "mild; tried CPAP; couldn't tolerate" (09/01/2014)  . Paroxysmal atrial fibrillation ([facility])   . Restless leg syndrome   . Sciatic nerve pain   . Type II diabetes mellitus ([place])    "borderline"  . Urinary frequency     Past Surgical History:  Procedure Laterality Date  . [place] VITRECTOMY WITH 20 GAUGE MVR PORT FOR MACULAR HOLE Right 10/08/2012   Procedure: 25 GAUGE PARS PLANA VITRECTOMY WITH 20 GAUGE MVR PORT FOR MACULAR HOLE; Membrame peel, serum patch; Laser treatment ; Gas exchange;  Surgeon: Hayden Pedro, MD;  Location: [facility];  Service: Ophthalmology;  Laterality: Right;  . 25 GAUGE PARS PLANA VITRECTOMY WITH 20 GAUGE MVR PORT FOR MACULAR HOLE Left 09/01/2014   Procedure: 25 GAUGE PARS PLANA VITRECTOMY WITH 20 GAUGE MVR PORT FOR MACULAR HOLE;  Surgeon: Hayden Pedro, MD;  Location: [facility];  Service: Ophthalmology;  Laterality: Left;  . ANTERIOR CERVICAL DECOMP/DISCECTOMY FUSION N/A 12/20/2015   Procedure: C5-6 Anterior Cervical Discectomy and Fusion, Allograft, and Plate;  Surgeon: [name], MD;  Location: [facility];   Service: Orthopedics;  Laterality: N/A;  . APPENDECTOMY  2009  . bladder lesion removed    . CARDIAC CATHETERIZATION  01/11/2012  . CATARACT EXTRACTION W/ INTRAOCULAR LENS  IMPLANT, BILATERAL Bilateral 2011  . CHOLECYSTECTOMY  2009  . COLONOSCOPY    . COMPRESSION HIP SCREW Left 04/04/2014   Procedure: COMPRESSION HIP LEFT;  Surgeon: Mcarthur Rossetti, MD;  Location: [facility];  Service: Orthopedics;  Laterality: Left;  . CORONARY ANGIOPLASTY    . CORONARY ARTERY BYPASS GRAFT  01/18/2012   Procedure: CORONARY ARTERY BYPASS GRAFTING (CABG);  Surgeon: Ivin Poot, MD;  Location: [facility];  Service: Open Heart Surgery;  Laterality: N/A;  times four,  using left internal mammary artery  . CYSTOSCOPY WITH URETEROSCOPY  2008; 2009   "painted inside of bladder wall"  . ELBOW SURGERY Right 1988   "reconstructive OR"  . ENDOVEIN HARVEST OF GREATER SAPHENOUS VEIN  01/18/2012   Procedure: ENDOVEIN HARVEST OF GREATER SAPHENOUS VEIN;  Surgeon: Ivin Poot, MD;  Location: [facility];  Service: Open Heart Surgery;  Laterality: Right;  . ESOPHAGOGASTRODUODENOSCOPY    . ESOPHAGOGASTRODUODENOSCOPY N/A 06/05/2014   Procedure: ESOPHAGOGASTRODUODENOSCOPY (EGD);  Surgeon: Irene Shipper, MD;  Location: [facility];  Service: Endoscopy;  Laterality: N/A;  . EXCISION/RELEASE BURSA HIP Left 07/30/2015   Procedure: EXCISION/RELEASE BURSA HIP;  Surgeon: Mcarthur Rossetti, MD;  Location: [facility];  Service: Orthopedics;  Laterality: Left;  . EXPLORATORY LAPAROTOMY  2009   with right colectomy  . FRACTURE SURGERY    . GAS INSERTION Left 09/01/2014   Procedure: INSERTION OF GAS;  Surgeon: Hayden Pedro, MD;  Location: [facility];  Service: Ophthalmology;  Laterality: Left;  C3F8  . GAS/FLUID EXCHANGE Left 09/01/2014   Procedure: GAS/FLUID EXCHANGE;  Surgeon: Hayden Pedro, MD;  Location: [facility];  Service: Ophthalmology;  Laterality: Left;  . HARDWARE REMOVAL Left 07/30/2015   Procedure: attempted LEFT HIP HARDWARE  REMOVAL, LEFT HIP BURSECTOMY;  Surgeon: Mcarthur Rossetti, MD;  Location: [facility];  Service: Orthopedics;  Laterality: Left;  . HEEL SPUR EXCISION Bilateral 1970's  . INTERTROCHANTERIC HIP FRACTURE SURGERY Left 04/04/2014  . JOINT REPLACEMENT    . LEFT HEART CATH AND CORS/GRAFTS ANGIOGRAPHY N/A 05/28/2017   Procedure: LEFT HEART CATH AND CORS/GRAFTS ANGIOGRAPHY;  Surgeon: Belva Crome, MD;  Location: [facility];  Service: Cardiovascular;  Laterality: N/A;  . LEFT HEART CATHETERIZATION WITH CORONARY ANGIOGRAM N/A 01/11/2012   Procedure: LEFT HEART CATHETERIZATION WITH CORONARY ANGIOGRAM;  Surgeon: Leonie Man, MD;  Location: [facility];  Service: Cardiovascular;  Laterality: N/A;  . LUMBAR [place] SURGERY  ?2010   "cleaned out the stenosis" (01/11/2012)  . MEMBRANE PEEL Left 09/01/2014   Procedure: MEMBRANE PEEL;  Surgeon: Hayden Pedro, MD;  Location: [place];  Service: Ophthalmology;  Laterality: Left;  . NM MYOCAR MULTIPLE W/SPECT  04/09/2012   NORMAL STRESS NUCLEAR STUDY. EF 51%.  [name] PARS PLANA VITRECTOMY W/ REPAIR OF MACULAR HOLE Left 09/01/2014  . PARTIAL COLECTOMY     d/t diverticulosis  . PHOTOCOAGULATION WITH LASER Left 09/01/2014   Procedure: PHOTOCOAGULATION WITH LASER;  Surgeon: Hayden Pedro, MD;  Location: [place];  Service: Ophthalmology;  Laterality: Left;  HEADSCOPE LASER  . RECONSTRUCTION MEDIAL COLLATERAL LIGAMENT ELBOW W/ TENDON GRAFT Right 1988 X 2  . SERUM PATCH Left 09/01/2014   Procedure: SERUM PATCH;  Surgeon: Hayden Pedro, MD;  Location: [facility];  Service: Ophthalmology;  Laterality: Left;  . TONSILLECTOMY AND ADENOIDECTOMY  ~1943  . TOTAL KNEE ARTHROPLASTY Bilateral ~ 2001; 2011   right; left  . TRANSTHORACIC ECHOCARDIOGRAM  01/04/2012   MODERATE SEVERE-SEVERE LVH. EF=>55%. MILDLY IMPAIRED LV RELAXATION. LA IS MODERATE TO SEVERE DILATED. MODERATE MR. MV LEAFLETS APPEAR THICHENED.  [name] TUMOR EXCISION Left 12/2011   arm    Current Medications: Outpatient  Medications Prior to Visit  Medication Sig Dispense Refill  . allopurinol (ZYLOPRIM) 300 MG tablet Take 300 mg by mouth daily.  2  . HYDROcodone-acetaminophen (NORCO/VICODIN) 5-325 MG tablet Take 1 tablet by mouth every 8 (eight) hours as needed for moderate pain. 30 tablet 0  . pantoprazole (PROTONIX) 40 MG tablet  Take 1 tablet (40 mg total) by mouth 2 (two) times daily. 60 tablet 3  . metoprolol succinate (TOPROL-XL) 25 MG 24 hr tablet Take 25 mg by mouth daily.  12  . APPLE CIDER VINEGAR PO Take 480 mg by mouth daily.    [name] aspirin EC 81 MG tablet Take 81 mg by mouth daily.    [name] atorvastatin (LIPITOR) 40 MG tablet TAKE 1 TABLET(40 MG) BY MOUTH DAILY AT 6 PM (Patient taking differently: Take 40 mg by mouth daily at 6 PM. ) 30 tablet 7  . calcium carbonate (TUMS EX) 750 MG chewable tablet Chew 6-7 tablets by mouth daily as needed for heartburn.     [name] FIBER SELECT GUMMIES PO Take 2-3 tablets by mouth daily.     [name] loperamide (IMODIUM) 2 MG capsule Take 6 mg by mouth daily as needed for diarrhea or loose stools.     . Multiple Vitamins-Minerals (MULTIVITAMIN WITH MINERALS) tablet Take 1 tablet by mouth daily.      [name] oxyCODONE-acetaminophen (PERCOCET/ROXICET) 5-325 MG tablet Take 2 tablets by mouth every 6 (six) hours as needed for severe pain.    . tadalafil (CIALIS) 5 MG tablet Take 5 mg by mouth daily. For interstitial cystitis.     No facility-administered medications prior to visit.      Allergies:   Morphine   Social History   Socioeconomic History  . Marital status: Married    Spouse name: Not on file  . Number of children: 2  . Years of education: Not on file  . Highest education level: Not on file  Occupational History  . Occupation: Retired    Fish farm manager: RETIRED  Social Needs  . Financial resource strain: Not on file  . Food insecurity:    Worry: Not on file    Inability: Not on file  . Transportation needs:    Medical: Not on file    Non-medical: Not on file  Tobacco Use   . Smoking status: Never Smoker  . Smokeless tobacco: Never Used  Substance and Sexual Activity  . Alcohol use: No    Comment: 09/01/2014 "glass of wine a few times/yr; if that"  . Drug use: No  . Sexual activity: Yes  Lifestyle  . Physical activity:    Days per week: Not on file    Minutes per session: Not on file  . Stress: Not on file  Relationships  . Social connections:    Talks on phone: Not on file    Gets together: Not on file    Attends religious service: Not on file    Active member of club or organization: Not on file    Attends meetings of clubs or organizations: Not on file    Relationship status: Not on file  Other Topics Concern  . Not on file  Social History Narrative  . Not on file     Family History:  The patient's family history includes Emphysema in his maternal grandfather and mother; Kidney disease in his father.   ROS:   Please see the history of present illness.    ROS All other systems reviewed and are negative.   PHYSICAL EXAM:   VS:  BP (!) 140/58 (BP Location: Left Arm, Patient Position: Sitting, Cuff Size: Normal)   Pulse (!) 103   Ht 6\' 2"  (1.88 m)   Wt 224 lb (101.6 kg)   SpO2 97% Comment: Started off at 95% and after walking it was at 89%.  BMI  28.76 kg/m    GEN: Well nourished, well developed. Uncomfortable sitting in chair HEENT: normal  Neck: no JVD, carotid bruits, or masses Cardiac: RRR; no murmurs, rubs, or gallops,no edema  Respiratory:  clear to auscultation bilaterally, normal work of breathing GI: soft, nontender, nondistended, + BS MS: no deformity or atrophy  Skin: warm and dry, no rash Neuro:  Alert and Oriented x 3, Strength and sensation are intact Psych: euthymic mood, full affect  Wt Readings from Last 3 Encounters:  05/06/18 224 lb (101.6 kg)  05/03/18 227 lb 6.4 oz (103.1 kg)  05/02/18 230 lb (104.3 kg)      Studies/Labs Reviewed:   EKG:  EKG is ordered today.  The ekg ordered today demonstrates  ventricular bigeminy  Recent Labs: 05/03/2018: ALT 34; BUN 23; Creatinine, Ser 1.15; Hemoglobin 13.5; Platelets 240; Potassium 4.1; Sodium 138   Lipid Panel    Component Value Date/Time   CHOL 89 04/04/2014 0502   TRIG 214 (H) 04/04/2014 0502   HDL 23 (L) 04/04/2014 0502   CHOLHDL 3.9 04/04/2014 0502   VLDL 43 (H) 04/04/2014 0502   LDLCALC 23 04/04/2014 0502    Additional studies/ records that were reviewed today include:   Echo 01/16/2017 LV EF: 55% - 60% Study Conclusions  - Left ventricle: The cavity size was normal. Wall thickness was increased in a pattern of moderate LVH. Systolic function was normal. The estimated ejection fraction was in the range of 55% to 60%. Wall motion was normal; there were no regional wall motion abnormalities. Doppler parameters are consistent with abnormal left ventricular relaxation (grade 1 diastolic dysfunction). - Aortic valve: Trileaflet; mildly thickened, mildly calcified leaflets. - Mitral valve: Calcified annulus. There was moderate regurgitation. - Left atrium: The atrium was moderately dilated. Volume/bsa, S: 43.8 ml/m^2.   Myoview 01/17/2017 Study Highlights     The left ventricular ejection fraction is mildly decreased (45-54%).  Nuclear stress EF: 47%.  Blood pressure demonstrated a hypertensive response to exercise.  No T wave inversion was noted during stress.  There was no ST segment deviation noted during stress.  This is an intermediate risk study.  No reversible ischemia. LVEF 47% with inferior wall hypokinesis and septal dyskinesis. This is an intermediate risks study. Prominent RV uptake is noted, suggestive of elevated pulmonary pressure. Echocardiographic correlation of LVEF is recommended if not already performed.    Cath 05/28/2017  Occlusion of sequential saphenous vein graft to the distal obtuse marginal branches. The vein graft connection between the 2 marginals remains  patent.  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 TIMI grade III flow was noted. Distal anastomosis is widely patent. The PDA is occluded proximal to the graft insertion site.  Patent LIMA to the LAD.  Occluded sequential saphenous vein graft to the second and third obtuse marginal. The saphenous vein graft communication between the obtuse marginals remains patent.  Normal left main  Total occlusion of the proximal to mid LAD.  40-50% mid circumflex stenosis, 80% stenosis in the second obtuse marginal proximal to the graft insertion site, and widely patent third obtuse marginal. As mentioned above, the graft segment between the marginals is still patent.  Total occlusion of the PDA with diffuse 40% stenosis in the proximal to mid vessel.  Normal LV function.  RECOMMENDATIONS:   Continue medical therapy. Monitor fusiform aneurysm formation and saphenous vein graft for progressive enlargement (perhaps by coronary CT angio).  Stenting  of the second obtuse marginal would be possible from the femoral or right radial approach. Would avoid left radial approach in the future. Not sure the marginal needs to be treated since the bypass graft connecting the 2 marginals remains widely patent.    ASSESSMENT:    1. Dyspnea, unspecified type   2. Ventricular bigeminy   3. Preoperative clearance   4. Coronary artery disease involving native coronary artery of native heart without angina pectoris   5. PAF (paroxysmal atrial fibrillation) ([place])   6. Essential hypertension   7. Hyperlipidemia LDL goal <70      PLAN:  In order of problems listed above:  1. Dyspnea: Patient was added on today's office visit for evaluation of dyspnea that occurred during preadmission testing last Thursday.  This occurred once and that has not recurred since.  We did a ambulatory O2 in the office today, he started to overlap with O2  saturation of 95%, even with walking, O2 saturation remained at 89% or above.  Suspicion for DVT and PE relatively low at this time.  If orthopedic group still think PE is likely, I would recommend a CT angiogram of the chest, but at this time, I do not recommend additional work-up.  2. Ventricular bigeminy: His heart rate actually dropped after walking, I suspected he went into ventricular bigeminy.  This may cause some increasing shortness of breath with walking, we will increase his Toprol-XL to 50 mg daily for suppression.  His heart rate was 50 bpm last Thursday during preadmission testing, I suspect he may have been in ventricular bigeminy at that time as well which may have contributed to some of his symptoms.  Increasing the Toprol-XL will suppress ventricular beats.  3. Preoperative clearance: He denies any recent chest pain, last cardiac catheterization was in March 2019.  I reviewed his last cardiac catheterization report with DOD Dr. Peter Martinique, who agrees, at this time no further work-up is needed prior to surgery.  He is holding aspirin for the intended surgery, however he will need to restart aspirin after the surgery  4. CAD: Stable on last cardiac catheterization in March 2019.  He does have a large fusiform aneurysm in the proximal and the distal segment of SVG to PDA bypass graft.  I will defer to Dr. Gwenlyn Found to consider coronary CT at some point to monitor this fusiform aneurysm.  5. Postop atrial fibrillation: Occurred in the postop setting after bypass surgery.  He was on Coumadin this was later discontinued.  No recurrence of A. Fib.  6. Hyperlipidemia: On Lipitor 40 mg daily  7. Hypertension: His blood pressure is mildly elevated, however he is having significant back pain.    Medication Adjustments/Labs and Tests Ordered: Current medicines are reviewed at length with the patient today.  Concerns regarding medicines are outlined above.  Medication changes, Labs and Tests  ordered today are listed in the Patient Instructions below. Patient Instructions  Medication Instructions:   INCREASE METOPROL SUCCINATE (TOPROL) TO 50 MG DAILY  If you need a refill on your cardiac medications before your next appointment, please call your pharmacy.   Lab work:  NONE  If you have labs (blood work) drawn today and your tests are completely normal, you will receive your results only by: [name] MyChart Message (if you have MyChart) OR . A paper copy in the mail If you have any lab test that is abnormal or we need to change your treatment, we will call you to review the  results.  Testing/Procedures:  NONE  Follow-Up: At [facility], you and your health needs are our priority.  As part of our continuing mission to provide you with exceptional heart care, we have created designated Provider Care Teams.  These Care Teams include your primary Cardiologist (physician) and Advanced Practice Providers (APPs -  Physician Assistants and Nurse Practitioners) who all work together to provide you with the care you need, when you need it. You will need a follow up appointment in 6 months (AUGUST 2020).  Please call our office 2 months June 2020) in advance to schedule this appointment.  You may see Quay Burow, MD or one of the following Advanced Practice Providers on your designated Care Team:   Kerin Ransom, PA-C Roby Lofts, [place] . Sande Rives, PA-C         Signed, [place], [place]  05/06/2018 4:57 PM    [facility] [place], [place], [place]  [address] Phone: [phone]; Fax: [phone]

## 2018-05-06 NOTE — Telephone Encounter (Signed)
Patient called stating the admitting nurse told him to ask Dr Lorin Mercy to prescribe some medication for him because he has some irritation in his groin area. Patient said he is having surgery Friday. The number to contact patient is [phone]

## 2018-05-06 NOTE — Telephone Encounter (Signed)
I called no answer. Can discuss rash problem and order some post op if needed.

## 2018-05-06 NOTE — Patient Instructions (Addendum)
Medication Instructions:   INCREASE METOPROL SUCCINATE (TOPROL) TO 50 MG DAILY  If you need a refill on your cardiac medications before your next appointment, please call your pharmacy.   Lab work:  NONE  If you have labs (blood work) drawn today and your tests are completely normal, you will receive your results only by: [name] MyChart Message (if you have MyChart) OR . A paper copy in the mail If you have any lab test that is abnormal or we need to change your treatment, we will call you to review the results.  Testing/Procedures:  NONE  Follow-Up: At [facility], you and your health needs are our priority.  As part of our continuing mission to provide you with exceptional heart care, we have created designated Provider Care Teams.  These Care Teams include your primary Cardiologist (physician) and Advanced Practice Providers (APPs -  Physician Assistants and Nurse Practitioners) who all work together to provide you with the care you need, when you need it. You will need a follow up appointment in 6 months (AUGUST 2020).  Please call our office 2 months June 2020) in advance to schedule this appointment.  You may see Quay Burow, MD or one of the following Advanced Practice Providers on your designated Care Team:   Kerin Ransom, PA-C Roby Lofts, [place] . Sande Rives, PA-C

## 2018-05-07 ENCOUNTER — Telehealth (INDEPENDENT_AMBULATORY_CARE_PROVIDER_SITE_OTHER): Payer: Self-pay | Admitting: Orthopaedic Surgery

## 2018-05-07 NOTE — Telephone Encounter (Signed)
Patient called back advised the rash is dark red and itchy. Patient said Dr Lorin Mercy asked him to call back with this information.  The number to contact patient is [phone] or [phone]

## 2018-05-07 NOTE — Telephone Encounter (Signed)
Patient called asked for a call back concerning the rash on his groin area. Patient also gave another phone number to contact him. The numbers are [phone] or [phone]

## 2018-05-07 NOTE — Anesthesia Preprocedure Evaluation (Addendum)
Anesthesia Evaluation  Patient identified by MRN, date of birth, ID band Patient awake    Reviewed: Allergy & Precautions, NPO status , Patient's Chart, lab work & pertinent test results  History of Anesthesia Complications Negative for: history of anesthetic complications  Airway Mallampati: III  TM Distance: >3 FB Neck ROM: Full    Dental  (+) Teeth Intact, Dental Advisory Given   Pulmonary neg pulmonary ROS,    Pulmonary exam normal breath sounds clear to auscultation       Cardiovascular hypertension, Pt. on medications and Pt. on home beta blockers + CAD and + CABG (2013)  Normal cardiovascular exam Rhythm:Regular Rate:Normal  TTE 2018: moderate LVH, EF 97-58%, grade 1 diastolic dysfunction, moderate MR, moderate LAE  Negative stress TTE 2018   Neuro/Psych negative neurological ROS     GI/Hepatic Neg liver ROS, hiatal hernia, PUD, GERD  Medicated,  Endo/Other  diabetes, Well Controlled, Type 2  Renal/GU negative Renal ROS     Musculoskeletal  (+) Arthritis ,   Abdominal   Peds  Hematology negative hematology ROS (+)   Anesthesia Other Findings Day of surgery medications reviewed with the patient.  Reproductive/Obstetrics                           Anesthesia Physical Anesthesia Plan  ASA: III  Anesthesia Plan: General   Post-op Pain Management:    Induction: Intravenous, Rapid sequence and Cricoid pressure planned  PONV Risk Score and Plan: 2 and Treatment may vary due to age or medical condition and Ondansetron  Airway Management Planned: Oral ETT and Video Laryngoscope Planned  Additional Equipment:   Intra-op Plan:   Post-operative Plan: Extubation in OR  Informed Consent: I have reviewed the patients History and Physical, chart, labs and discussed the procedure including the risks, benefits and alternatives for the proposed anesthesia with the patient or authorized  representative who has indicated his/her understanding and acceptance.     Dental advisory given  Plan Discussed with: CRNA  Anesthesia Plan Comments: (See PAT note by Karoline Caldwell, PA-C  No steroid or benzodiazepine unless h/o PONV. )     Anesthesia Quick Evaluation

## 2018-05-07 NOTE — Progress Notes (Signed)
Anesthesia Chart Review:  Case:  [id number] Date/Time:  05/10/18 0715   Procedure:  lumbar decompression L3-4 recurrent stenosis (N/A )   Anesthesia type:  General   Pre-op diagnosis:  recurrent L3-4 severe stenosis   [facility]:  [facility] ROOM 06 / [facility]   Surgeon:  [name], MD      DISCUSSION: 85 yo male never smoker. Pertinent hx includes hypertension, hyperlipidemia, CAD s/p CABG, paroxysmal afib (postop, no recurrence), alpha 1 antitrypsin deficiency, obstructive sleep apnea intolerant to CPAP, DM 2, and restless leg syndrome, esophageal dysmotility.   History of CABG x 4 12/22/2011. He had a negative Myoview stress test on 01/17/17 and a recent cardiac catheterization performed by Dr. Tamala Julian because of chest pain 05/28/17 revealing an occluded vein to obtuse marginal branch with a patent continuation (sequential graft between 2 marginals). The vein to the right was patent as was the LIMA to the LAD and LV function was normal. Medical therapy was recommended. There was a question of aneurysm formation in the saphenous vein. It was recommended to monitor this in the future using a coronary CT angiogram. He did have an abdominal CT that showed enlarging hiatal hernia and this probably was the cause of his pain which has improved with pharmacologic therapy.  Pt was initially cleared by cardiology 04/19/2018. However, he referred back to to cardiology by ortho on 2/13 as he was noted to be dyspneic at his H&P appointment. He was seen by Almyra Deforest, PA-C 05/06/18. I have copied portions of his note below.  1. Dyspnea: Patient was added on today's office visit for evaluation of dyspnea that occurred during preadmission testing last Thursday.  This occurred once and that has not recurred since.  We did a ambulatory O2 in the office today, he started to overlap with O2 saturation of 95%, even with walking, O2 saturation remained at 89% or above.  Suspicion for DVT and PE relatively low at this time.  If  orthopedic group still think PE is likely, I would recommend a CT angiogram of the chest, but at this time, I do not recommend additional work-up.  2. Ventricular bigeminy: His heart rate actually dropped after walking, I suspected he went into ventricular bigeminy.  This may cause some increasing shortness of breath with walking, we will increase his Toprol-XL to 50 mg daily for suppression.  His heart rate was 50 bpm last Thursday during preadmission testing, I suspect he may have been in ventricular bigeminy at that time as well which may have contributed to some of his symptoms.  Increasing the Toprol-XL will suppress ventricular beats.  3. Preoperative clearance: He denies any recent chest pain, last cardiac catheterization was in March 2019.  I reviewed his last cardiac catheterization report with DOD Dr. Peter Martinique, who agrees, at this time no further work-up is needed prior to surgery.  He is holding aspirin for the intended surgery, however he will need to restart aspirin after the surgery  Pt reported bilateral inguinal rash at PAT appt. Dr. Lorin Mercy aware and will address per telephone encounter in epic.  Elevated alk phos noted on preop labs. I called pt's PCP Dr. Shelia Media to make him aware, he said he would have the pt come in to draw repeat labs prior to surgery and would plan for additional workup after surgery if needed.   Anticipate he can proceed as planned barring acute status change.   VS: BP (!) 154/60   Pulse 62   Temp  36.5 C   Resp 20   Ht _0  (1.88 m)   Wt 103.1 kg   SpO2 97%   BMI 29.20 kg/m   PROVIDERS: Deland Pretty, MD is PCP  [name], MD is Cardiologist  LABS: Elevated alk phos (all labs ordered are listed, but only abnormal results are displayed)  Labs Reviewed  GLUCOSE, CAPILLARY - Abnormal; Notable for the following components:      Result Value   Glucose-Capillary 136 (*)    All other components within normal limits  COMPREHENSIVE METABOLIC  PANEL - Abnormal; Notable for the following components:   CO2 20 (*)    Glucose, Bld 108 (*)    Alkaline Phosphatase 221 (*)    GFR calc non Af Amer 59 (*)    All other components within normal limits  HEMOGLOBIN A1C - Abnormal; Notable for the following components:   Hgb A1c MFr Bld 6.8 (*)    All other components within normal limits  SURGICAL PCR SCREEN  CBC  URINALYSIS, ROUTINE W REFLEX MICROSCOPIC     IMAGES: CHEST - 2 VIEW 05/03/2018:  COMPARISON:  December 16, 2015.  FINDINGS: There is no appreciable edema or consolidation. There is a calcified granuloma in the right upper lobe. Heart size and pulmonary vascularity are normal. There is a moderate hiatal hernia. Patient is status post coronary artery bypass grafting. There is aortic atherosclerosis. There is postoperative change in the lower cervical spine. There is degenerative change in the lower thoracic spine.  IMPRESSION: No appreciable edema or consolidation. Calcified granuloma right upper lobe. Stable cardiac silhouette. Moderate hiatal hernia.  Aortic Atherosclerosis (ICD10-I70.0).  EKG: 04/19/2018: NSR with PVC. Rate 78.   CV: Cath 05/28/2017:  Occlusion of sequential saphenous vein graft to the distal obtuse marginal branches.  The vein graft connection between the 2 marginals remains patent.  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  TIMI grade III flow was noted.  Distal anastomosis is widely patent.  The PDA is occluded proximal to the graft insertion site.  Patent LIMA to the LAD.  Occluded sequential saphenous vein graft to the second and third obtuse marginal.  The saphenous vein graft communication between the obtuse marginals remains patent.  Normal left main  Total occlusion of the proximal to mid LAD.  40-50% mid circumflex stenosis, 80% stenosis in the second obtuse marginal proximal to the graft insertion site, and  widely patent third obtuse marginal.  As mentioned above, the graft segment between the marginals is still patent.  Total occlusion of the PDA with diffuse 40% stenosis in the proximal to mid vessel.  Normal LV function.  RECOMMENDATIONS:   Continue medical therapy.  Monitor fusiform aneurysm formation and saphenous vein graft for progressive enlargement (perhaps by coronary CT angio).  Stenting of the second obtuse marginal would be possible from the femoral or right radial approach.  Would avoid left radial approach in the future.  Not sure the marginal needs to be treated since the bypass graft connecting the 2 marginals remains widely patent.  Nuclear stress 01/17/2017:  The left ventricular ejection fraction is mildly decreased (45-54%).  Nuclear stress EF: 47%.  Blood pressure demonstrated a hypertensive response to exercise.  No T wave inversion was noted during stress.  There was no ST segment deviation noted during stress.  This is an intermediate risk study.   No reversible ischemia. LVEF 47% with inferior wall hypokinesis and septal  dyskinesis. This is an intermediate risks study. Prominent RV uptake is noted, suggestive of elevated pulmonary pressure. Echocardiographic correlation of LVEF is recommended if not already performed.  TTE 01/16/2017: Study Conclusions  - Left ventricle: The cavity size was normal. Wall thickness was   increased in a pattern of moderate LVH. Systolic function was   normal. The estimated ejection fraction was in the range of 55%   to 60%. Wall motion was normal; there were no regional wall   motion abnormalities. Doppler parameters are consistent with   abnormal left ventricular relaxation (grade 1 diastolic   dysfunction). - Aortic valve: Trileaflet; mildly thickened, mildly calcified   leaflets. - Mitral valve: Calcified annulus. There was moderate   regurgitation. - Left atrium: The atrium was moderately dilated. Volume/bsa, S:    43.8 ml/m^2.  Past Medical History:  Diagnosis Date  . Adenomatous colon polyp   . Alpha-1-antitrypsin deficiency ([place])   . Alpha-1-antitrypsin deficiency carrier   . Anemia   . Anxiety   . Arthritis    "knees; bad in my back" (09/01/2014)  . Bruises easily   . Chronic bronchitis ([place])    "get it pretty much q yr" (09/01/2014)  . Chronic lower back pain   . Coronary artery disease   . Depression    "related to health" (09/01/2014)  . Diarrhea    has had a part of colon removed  . Diverticulitis   . Diverticulosis   . Dyspnea on exertion   . Dysrhythmia    bradycardia in the 30's (01/11/2012).. Afib briefly after CABG  . Esophageal dysmotility   . Esophageal stricture   . Gastritis   . GERD (gastroesophageal reflux disease)    takes Pantoprazole daily  . Gout   . Hemorrhoids   . Hiatal hernia   . History of colon polyps   . Hyperlipidemia    doesn't require meds  . Hypertension    takes Losartan daily; current not taking   . IC (interstitial cystitis)   . Insomnia    takes Nortrypyline nightly  . Joint pain   . Joint swelling   . Macular hole of both eyes   . Nocturia   . OSA (obstructive sleep apnea)    "mild; tried CPAP; couldn't tolerate" (09/01/2014)  . Paroxysmal atrial fibrillation ([facility])   . Restless leg syndrome   . Sciatic nerve pain   . Type II diabetes mellitus ([place])    "borderline"  . Urinary frequency     Past Surgical History:  Procedure Laterality Date  . [place] VITRECTOMY WITH 20 GAUGE MVR PORT FOR MACULAR HOLE Right 10/08/2012   Procedure: 25 GAUGE PARS PLANA VITRECTOMY WITH 20 GAUGE MVR PORT FOR MACULAR HOLE; Membrame peel, serum patch; Laser treatment ; Gas exchange;  Surgeon: Hayden Pedro, MD;  Location: [facility];  Service: Ophthalmology;  Laterality: Right;  . 25 GAUGE PARS PLANA VITRECTOMY WITH 20 GAUGE MVR PORT FOR MACULAR HOLE Left 09/01/2014   Procedure: 25 GAUGE PARS PLANA VITRECTOMY WITH 20 GAUGE MVR PORT FOR MACULAR HOLE;   Surgeon: Hayden Pedro, MD;  Location: [facility];  Service: Ophthalmology;  Laterality: Left;  . ANTERIOR CERVICAL DECOMP/DISCECTOMY FUSION N/A 12/20/2015   Procedure: C5-6 Anterior Cervical Discectomy and Fusion, Allograft, and Plate;  Surgeon: [name], MD;  Location: [facility];  Service: Orthopedics;  Laterality: N/A;  . APPENDECTOMY  2009  . bladder lesion removed    . CARDIAC CATHETERIZATION  01/11/2012  . CATARACT  EXTRACTION W/ INTRAOCULAR LENS  IMPLANT, BILATERAL Bilateral 2011  . CHOLECYSTECTOMY  2009  . COLONOSCOPY    . COMPRESSION HIP SCREW Left 04/04/2014   Procedure: COMPRESSION HIP LEFT;  Surgeon: Mcarthur Rossetti, MD;  Location: [facility];  Service: Orthopedics;  Laterality: Left;  . CORONARY ANGIOPLASTY    . CORONARY ARTERY BYPASS GRAFT  01/18/2012   Procedure: CORONARY ARTERY BYPASS GRAFTING (CABG);  Surgeon: Ivin Poot, MD;  Location: [facility];  Service: Open Heart Surgery;  Laterality: N/A;  times four, using left internal mammary artery  . CYSTOSCOPY WITH URETEROSCOPY  2008; 2009   "painted inside of bladder wall"  . ELBOW SURGERY Right 1988   "reconstructive OR"  . ENDOVEIN HARVEST OF GREATER SAPHENOUS VEIN  01/18/2012   Procedure: ENDOVEIN HARVEST OF GREATER SAPHENOUS VEIN;  Surgeon: Ivin Poot, MD;  Location: [facility];  Service: Open Heart Surgery;  Laterality: Right;  . ESOPHAGOGASTRODUODENOSCOPY    . ESOPHAGOGASTRODUODENOSCOPY N/A 06/05/2014   Procedure: ESOPHAGOGASTRODUODENOSCOPY (EGD);  Surgeon: Irene Shipper, MD;  Location: [facility];  Service: Endoscopy;  Laterality: N/A;  . EXCISION/RELEASE BURSA HIP Left 07/30/2015   Procedure: EXCISION/RELEASE BURSA HIP;  Surgeon: Mcarthur Rossetti, MD;  Location: [facility];  Service: Orthopedics;  Laterality: Left;  . EXPLORATORY LAPAROTOMY  2009   with right colectomy  . FRACTURE SURGERY    . GAS INSERTION Left 09/01/2014   Procedure: INSERTION OF GAS;  Surgeon: Hayden Pedro, MD;  Location: [facility];  Service:  Ophthalmology;  Laterality: Left;  C3F8  . GAS/FLUID EXCHANGE Left 09/01/2014   Procedure: GAS/FLUID EXCHANGE;  Surgeon: Hayden Pedro, MD;  Location: [facility];  Service: Ophthalmology;  Laterality: Left;  . HARDWARE REMOVAL Left 07/30/2015   Procedure: attempted LEFT HIP HARDWARE REMOVAL, LEFT HIP BURSECTOMY;  Surgeon: Mcarthur Rossetti, MD;  Location: [facility];  Service: Orthopedics;  Laterality: Left;  . HEEL SPUR EXCISION Bilateral 1970's  . INTERTROCHANTERIC HIP FRACTURE SURGERY Left 04/04/2014  . JOINT REPLACEMENT    . LEFT HEART CATH AND CORS/GRAFTS ANGIOGRAPHY N/A 05/28/2017   Procedure: LEFT HEART CATH AND CORS/GRAFTS ANGIOGRAPHY;  Surgeon: Belva Crome, MD;  Location: [facility];  Service: Cardiovascular;  Laterality: N/A;  . LEFT HEART CATHETERIZATION WITH CORONARY ANGIOGRAM N/A 01/11/2012   Procedure: LEFT HEART CATHETERIZATION WITH CORONARY ANGIOGRAM;  Surgeon: Leonie Man, MD;  Location: [facility];  Service: Cardiovascular;  Laterality: N/A;  . LUMBAR [place] SURGERY  ?2010   "cleaned out the stenosis" (01/11/2012)  . MEMBRANE PEEL Left 09/01/2014   Procedure: MEMBRANE PEEL;  Surgeon: Hayden Pedro, MD;  Location: [facility];  Service: Ophthalmology;  Laterality: Left;  . NM MYOCAR MULTIPLE W/SPECT  04/09/2012   NORMAL STRESS NUCLEAR STUDY. EF 51%.  [name] PARS PLANA VITRECTOMY W/ REPAIR OF MACULAR HOLE Left 09/01/2014  . PARTIAL COLECTOMY     d/t diverticulosis  . PHOTOCOAGULATION WITH LASER Left 09/01/2014   Procedure: PHOTOCOAGULATION WITH LASER;  Surgeon: Hayden Pedro, MD;  Location: [place];  Service: Ophthalmology;  Laterality: Left;  HEADSCOPE LASER  . RECONSTRUCTION MEDIAL COLLATERAL LIGAMENT ELBOW W/ TENDON GRAFT Right 1988 X 2  . SERUM PATCH Left 09/01/2014   Procedure: SERUM PATCH;  Surgeon: Hayden Pedro, MD;  Location: [facility];  Service: Ophthalmology;  Laterality: Left;  . TONSILLECTOMY AND ADENOIDECTOMY  ~1943  . TOTAL KNEE ARTHROPLASTY Bilateral ~ 2001; 2011    right; left  . TRANSTHORACIC ECHOCARDIOGRAM  01/04/2012  MODERATE SEVERE-SEVERE LVH. EF=>55%. MILDLY IMPAIRED LV RELAXATION. LA IS MODERATE TO SEVERE DILATED. MODERATE MR. MV LEAFLETS APPEAR THICHENED.  [name] TUMOR EXCISION Left 12/2011   arm    MEDICATIONS: . allopurinol (ZYLOPRIM) 300 MG tablet  . HYDROcodone-acetaminophen (NORCO/VICODIN) 5-325 MG tablet  . metoprolol succinate (TOPROL-XL) 50 MG 24 hr tablet  . pantoprazole (PROTONIX) 40 MG tablet   No current facility-administered medications for this encounter.     Wynonia Musty [facility] Short Stay Center/Anesthesiology Phone [phone] 05/07/2018 1:49 PM

## 2018-05-07 NOTE — Telephone Encounter (Signed)
Please see below.

## 2018-05-07 NOTE — Telephone Encounter (Signed)
Please advise. Patient has called back and left additional numbers to reach him on. Thanks.

## 2018-05-07 NOTE — Telephone Encounter (Signed)
I called discussed.  

## 2018-05-07 NOTE — Telephone Encounter (Signed)
Noted. Patient called back today requesting call from Dr. Lorin Mercy to discuss. Patient left additional phone numbers that he could be reached at.

## 2018-05-08 NOTE — Telephone Encounter (Signed)
I called patient and advised. 

## 2018-05-08 NOTE — Telephone Encounter (Signed)
Tinactin as we discussed.

## 2018-05-09 NOTE — H&P (Addendum)
Lucas Morales is an 85 y.o. male.   Chief Complaint: Back pain and lower extremity radiculopathy   HPI: Patient with history of L3-4 stenosis comes in for preop evaluation.  Scheduled for L3-4 decompression May 10, 2018.  States that lumbar symptoms are unchanged from last office visit with Dr. Lorin Mercy.  He is wanting to proceed with surgery as scheduled.   Past Medical History:  Diagnosis Date  . Adenomatous colon polyp   . Alpha-1-antitrypsin deficiency ([place])   . Alpha-1-antitrypsin deficiency carrier   . Anemia   . Anxiety   . Arthritis    "knees; bad in my back" (09/01/2014)  . Bruises easily   . Chronic bronchitis ([place])    "get it pretty much q yr" (09/01/2014)  . Chronic lower back pain   . Coronary artery disease   . Depression    "related to health" (09/01/2014)  . Diarrhea    has had a part of colon removed  . Diverticulitis   . Diverticulosis   . Dyspnea on exertion   . Dysrhythmia    bradycardia in the 30's (01/11/2012).. Afib briefly after CABG  . Esophageal dysmotility   . Esophageal stricture   . Gastritis   . GERD (gastroesophageal reflux disease)    takes Pantoprazole daily  . Gout   . Hemorrhoids   . Hiatal hernia   . History of colon polyps   . Hyperlipidemia    doesn't require meds  . Hypertension    takes Losartan daily; current not taking   . IC (interstitial cystitis)   . Insomnia    takes Nortrypyline nightly  . Joint pain   . Joint swelling   . Macular hole of both eyes   . Nocturia   . OSA (obstructive sleep apnea)    "mild; tried CPAP; couldn't tolerate" (09/01/2014)  . Paroxysmal atrial fibrillation ([facility])   . Restless leg syndrome   . Sciatic nerve pain   . Type II diabetes mellitus ([place])    "borderline"  . Urinary frequency     Past Surgical History:  Procedure Laterality Date  . [place] VITRECTOMY WITH 20 GAUGE MVR PORT FOR MACULAR HOLE Right 10/08/2012   Procedure: 25 GAUGE PARS PLANA VITRECTOMY WITH 20 GAUGE  MVR PORT FOR MACULAR HOLE; Membrame peel, serum patch; Laser treatment ; Gas exchange;  Surgeon: Hayden Pedro, MD;  Location: [facility];  Service: Ophthalmology;  Laterality: Right;  . 25 GAUGE PARS PLANA VITRECTOMY WITH 20 GAUGE MVR PORT FOR MACULAR HOLE Left 09/01/2014   Procedure: 25 GAUGE PARS PLANA VITRECTOMY WITH 20 GAUGE MVR PORT FOR MACULAR HOLE;  Surgeon: Hayden Pedro, MD;  Location: [facility];  Service: Ophthalmology;  Laterality: Left;  . ANTERIOR CERVICAL DECOMP/DISCECTOMY FUSION N/A 12/20/2015   Procedure: C5-6 Anterior Cervical Discectomy and Fusion, Allograft, and Plate;  Surgeon: [name], MD;  Location: [facility];  Service: Orthopedics;  Laterality: N/A;  . APPENDECTOMY  2009  . bladder lesion removed    . CARDIAC CATHETERIZATION  01/11/2012  . CATARACT EXTRACTION W/ INTRAOCULAR LENS  IMPLANT, BILATERAL Bilateral 2011  . CHOLECYSTECTOMY  2009  . COLONOSCOPY    . COMPRESSION HIP SCREW Left 04/04/2014   Procedure: COMPRESSION HIP LEFT;  Surgeon: Mcarthur Rossetti, MD;  Location: [facility];  Service: Orthopedics;  Laterality: Left;  . CORONARY ANGIOPLASTY    . CORONARY ARTERY BYPASS GRAFT  01/18/2012   Procedure: CORONARY ARTERY BYPASS GRAFTING (CABG);  Surgeon: Tharon Aquas Trigt,  MD;  Location: [facility];  Service: Open Heart Surgery;  Laterality: N/A;  times four, using left internal mammary artery  . CYSTOSCOPY WITH URETEROSCOPY  2008; 2009   "painted inside of bladder wall"  . ELBOW SURGERY Right 1988   "reconstructive OR"  . ENDOVEIN HARVEST OF GREATER SAPHENOUS VEIN  01/18/2012   Procedure: ENDOVEIN HARVEST OF GREATER SAPHENOUS VEIN;  Surgeon: Ivin Poot, MD;  Location: [facility];  Service: Open Heart Surgery;  Laterality: Right;  . ESOPHAGOGASTRODUODENOSCOPY    . ESOPHAGOGASTRODUODENOSCOPY N/A 06/05/2014   Procedure: ESOPHAGOGASTRODUODENOSCOPY (EGD);  Surgeon: Irene Shipper, MD;  Location: [facility];  Service: Endoscopy;  Laterality: N/A;  . EXCISION/RELEASE BURSA HIP Left  07/30/2015   Procedure: EXCISION/RELEASE BURSA HIP;  Surgeon: Mcarthur Rossetti, MD;  Location: [facility];  Service: Orthopedics;  Laterality: Left;  . EXPLORATORY LAPAROTOMY  2009   with right colectomy  . FRACTURE SURGERY    . GAS INSERTION Left 09/01/2014   Procedure: INSERTION OF GAS;  Surgeon: Hayden Pedro, MD;  Location: [place];  Service: Ophthalmology;  Laterality: Left;  C3F8  . GAS/FLUID EXCHANGE Left 09/01/2014   Procedure: GAS/FLUID EXCHANGE;  Surgeon: Hayden Pedro, MD;  Location: [facility];  Service: Ophthalmology;  Laterality: Left;  . HARDWARE REMOVAL Left 07/30/2015   Procedure: attempted LEFT HIP HARDWARE REMOVAL, LEFT HIP BURSECTOMY;  Surgeon: Mcarthur Rossetti, MD;  Location: [facility];  Service: Orthopedics;  Laterality: Left;  . HEEL SPUR EXCISION Bilateral 1970's  . INTERTROCHANTERIC HIP FRACTURE SURGERY Left 04/04/2014  . JOINT REPLACEMENT    . LEFT HEART CATH AND CORS/GRAFTS ANGIOGRAPHY N/A 05/28/2017   Procedure: LEFT HEART CATH AND CORS/GRAFTS ANGIOGRAPHY;  Surgeon: Belva Crome, MD;  Location: [facility];  Service: Cardiovascular;  Laterality: N/A;  . LEFT HEART CATHETERIZATION WITH CORONARY ANGIOGRAM N/A 01/11/2012   Procedure: LEFT HEART CATHETERIZATION WITH CORONARY ANGIOGRAM;  Surgeon: Leonie Man, MD;  Location: [facility];  Service: Cardiovascular;  Laterality: N/A;  . LUMBAR [place] SURGERY  ?2010   "cleaned out the stenosis" (01/11/2012)  . MEMBRANE PEEL Left 09/01/2014   Procedure: MEMBRANE PEEL;  Surgeon: Hayden Pedro, MD;  Location: [facility];  Service: Ophthalmology;  Laterality: Left;  . NM MYOCAR MULTIPLE W/SPECT  04/09/2012   NORMAL STRESS NUCLEAR STUDY. EF 51%.  [name] PARS PLANA VITRECTOMY W/ REPAIR OF MACULAR HOLE Left 09/01/2014  . PARTIAL COLECTOMY     d/t diverticulosis  . PHOTOCOAGULATION WITH LASER Left 09/01/2014   Procedure: PHOTOCOAGULATION WITH LASER;  Surgeon: Hayden Pedro, MD;  Location: [facility];  Service: Ophthalmology;   Laterality: Left;  HEADSCOPE LASER  . RECONSTRUCTION MEDIAL COLLATERAL LIGAMENT ELBOW W/ TENDON GRAFT Right 1988 X 2  . SERUM PATCH Left 09/01/2014   Procedure: SERUM PATCH;  Surgeon: Hayden Pedro, MD;  Location: [facility];  Service: Ophthalmology;  Laterality: Left;  . TONSILLECTOMY AND ADENOIDECTOMY  ~1943  . TOTAL KNEE ARTHROPLASTY Bilateral ~ 2001; 2011   right; left  . TRANSTHORACIC ECHOCARDIOGRAM  01/04/2012   MODERATE SEVERE-SEVERE LVH. EF=>55%. MILDLY IMPAIRED LV RELAXATION. LA IS MODERATE TO SEVERE DILATED. MODERATE MR. MV LEAFLETS APPEAR THICHENED.  [name] TUMOR EXCISION Left 12/2011   arm    Family History  Problem Relation Age of Onset  . Emphysema Mother   . Kidney disease Father   . Emphysema Maternal Grandfather   . Colon cancer Neg Hx    Social History:  reports that he has never  smoked. He has never used smokeless tobacco. He reports that he does not drink alcohol or use drugs.  Allergies:  Allergies  Allergen Reactions  . Morphine Itching    No medications prior to admission.    No results found for this or any previous visit (from the past 48 hour(s)). No results found.  Review of Systems  Constitutional: Negative.   HENT: Negative.   Respiratory: Positive for shortness of breath.   Cardiovascular: Negative.   Gastrointestinal: Negative.   Genitourinary: Negative.   Musculoskeletal: Positive for back pain.  Skin: Positive for itching and rash.  Neurological: Positive for tingling.  Psychiatric/Behavioral: Negative.     There were no vitals taken for this visit. Physical Exam  Constitutional: He is oriented to person, place, and time. He appears well-developed and well-nourished.  Patient did have shortness of breath during my visit with him preop.  HENT:  Head: Normocephalic and atraumatic.  Eyes: Pupils are equal, round, and reactive to light. EOM are normal.  Cardiovascular: Normal heart sounds.  Respiratory: Breath sounds normal. He has no  wheezes.  GI: He exhibits no distension.  Musculoskeletal:        General: Tenderness present.  Neurological: He is alert and oriented to person, place, and time.  Skin: Skin is warm and dry.     Assessment/Plan L3-4 stenosis, low back pain and lower extremity radiculopathy.   We will proceed with L3-4 decompression as scheduled.  Patient did go back for cardiac clearance.  Benjiman Core, PA-C 05/09/2018, 2:06 PM

## 2018-05-10 ENCOUNTER — Encounter (HOSPITAL_COMMUNITY): Payer: Self-pay | Admitting: General Practice

## 2018-05-10 ENCOUNTER — Ambulatory Visit (HOSPITAL_COMMUNITY): Payer: Medicare Other | Admitting: Anesthesiology

## 2018-05-10 ENCOUNTER — Other Ambulatory Visit: Payer: Self-pay

## 2018-05-10 ENCOUNTER — Ambulatory Visit (HOSPITAL_COMMUNITY): Payer: Medicare Other | Admitting: Vascular Surgery

## 2018-05-10 ENCOUNTER — Ambulatory Visit (HOSPITAL_COMMUNITY): Payer: Medicare Other

## 2018-05-10 ENCOUNTER — Encounter (HOSPITAL_COMMUNITY)
Admission: RE | Disposition: A | Discharge status: Home / Self Care | Payer: Self-pay | Source: Ambulatory Visit | Attending: Orthopaedic Surgery

## 2018-05-10 ENCOUNTER — Observation Stay (HOSPITAL_COMMUNITY)
Admission: RE | Admit: 2018-05-10 | Discharge: 2018-05-11 | Disposition: A | Discharge status: Home / Self Care | Payer: Medicare Other | Source: Ambulatory Visit | Attending: Orthopaedic Surgery | Admitting: Orthopaedic Surgery

## 2018-05-10 DIAGNOSIS — M5416 Radiculopathy, lumbar region: Secondary | ICD-10-CM | POA: Diagnosis not present

## 2018-05-10 DIAGNOSIS — I480 Paroxysmal atrial fibrillation: Secondary | ICD-10-CM | POA: Diagnosis not present

## 2018-05-10 DIAGNOSIS — I2510 Atherosclerotic heart disease of native coronary artery without angina pectoris: Secondary | ICD-10-CM | POA: Diagnosis not present

## 2018-05-10 DIAGNOSIS — M48062 Spinal stenosis, lumbar region with neurogenic claudication: Principal | ICD-10-CM | POA: Insufficient documentation

## 2018-05-10 DIAGNOSIS — G4733 Obstructive sleep apnea (adult) (pediatric): Secondary | ICD-10-CM | POA: Diagnosis not present

## 2018-05-10 DIAGNOSIS — K219 Gastro-esophageal reflux disease without esophagitis: Secondary | ICD-10-CM | POA: Diagnosis not present

## 2018-05-10 DIAGNOSIS — Z79899 Other long term (current) drug therapy: Secondary | ICD-10-CM | POA: Diagnosis not present

## 2018-05-10 DIAGNOSIS — Z419 Encounter for procedure for purposes other than remedying health state, unspecified: Secondary | ICD-10-CM

## 2018-05-10 DIAGNOSIS — M109 Gout, unspecified: Secondary | ICD-10-CM | POA: Insufficient documentation

## 2018-05-10 DIAGNOSIS — G4700 Insomnia, unspecified: Secondary | ICD-10-CM | POA: Diagnosis not present

## 2018-05-10 DIAGNOSIS — Z951 Presence of aortocoronary bypass graft: Secondary | ICD-10-CM | POA: Insufficient documentation

## 2018-05-10 DIAGNOSIS — Z9049 Acquired absence of other specified parts of digestive tract: Secondary | ICD-10-CM | POA: Diagnosis not present

## 2018-05-10 DIAGNOSIS — I10 Essential (primary) hypertension: Secondary | ICD-10-CM | POA: Insufficient documentation

## 2018-05-10 DIAGNOSIS — Z96651 Presence of right artificial knee joint: Secondary | ICD-10-CM | POA: Diagnosis not present

## 2018-05-10 DIAGNOSIS — M48061 Spinal stenosis, lumbar region without neurogenic claudication: Secondary | ICD-10-CM | POA: Diagnosis present

## 2018-05-10 DIAGNOSIS — Z7982 Long term (current) use of aspirin: Secondary | ICD-10-CM | POA: Insufficient documentation

## 2018-05-10 HISTORY — PX: LUMBAR LAMINECTOMY/DECOMPRESSION MICRODISCECTOMY: SHX5026

## 2018-05-10 LAB — GLUCOSE, CAPILLARY
Glucose-Capillary: 125 mg/dL — ABNORMAL HIGH (ref 70–99)
Glucose-Capillary: 143 mg/dL — ABNORMAL HIGH (ref 70–99)

## 2018-05-10 SURGERY — LUMBAR LAMINECTOMY/DECOMPRESSION MICRODISCECTOMY
Anesthesia: General | Site: Back

## 2018-05-10 MED ORDER — SUGAMMADEX SODIUM 200 MG/2ML IV SOLN
INTRAVENOUS | Status: DC | PRN
  Administered 2018-05-10: 203.2 mg via INTRAVENOUS

## 2018-05-10 MED ORDER — ACETAMINOPHEN 325 MG PO TABS: 650.0000 mg | ORAL_TABLET | ORAL | Status: DC | PRN

## 2018-05-10 MED ORDER — OXYCODONE-ACETAMINOPHEN 5-325 MG PO TABS: 1.0000 | ORAL_TABLET | Freq: Four times a day (QID) | ORAL | 0 refills | Status: DC | PRN

## 2018-05-10 MED ORDER — BUPIVACAINE HCL (PF) 0.25 % IJ SOLN
INTRAMUSCULAR | Status: DC | PRN
  Administered 2018-05-10: 10 mL

## 2018-05-10 MED ORDER — SUCCINYLCHOLINE CHLORIDE 200 MG/10ML IV SOSY
PREFILLED_SYRINGE | INTRAVENOUS | Status: AC
  Filled 2018-05-10: qty 10

## 2018-05-10 MED ORDER — FENTANYL CITRATE (PF) 250 MCG/5ML IJ SOLN
INTRAMUSCULAR | Status: AC
  Filled 2018-05-10: qty 5

## 2018-05-10 MED ORDER — PROMETHAZINE HCL 25 MG/ML IJ SOLN: 6.2500 mg | INTRAMUSCULAR | Status: DC | PRN

## 2018-05-10 MED ORDER — ACETAMINOPHEN 650 MG RE SUPP: 650.0000 mg | RECTAL | Status: DC | PRN

## 2018-05-10 MED ORDER — PANTOPRAZOLE SODIUM 40 MG PO TBEC
40.0000 mg | DELAYED_RELEASE_TABLET | Freq: Two times a day (BID) | ORAL | Status: DC
  Administered 2018-05-10 – 2018-05-11 (×2): 40 mg via ORAL
  Filled 2018-05-10 (×2): qty 1

## 2018-05-10 MED ORDER — PROPOFOL 10 MG/ML IV BOLUS
INTRAVENOUS | Status: DC | PRN
  Administered 2018-05-10: 180 mg via INTRAVENOUS

## 2018-05-10 MED ORDER — HEMOSTATIC AGENTS (NO CHARGE) OPTIME
TOPICAL | Status: DC | PRN
  Administered 2018-05-10: 1

## 2018-05-10 MED ORDER — PHENOL 1.4 % MT LIQD: 1.0000 | OROMUCOSAL | Status: DC | PRN

## 2018-05-10 MED ORDER — SODIUM CHLORIDE 0.9% FLUSH: 3.0000 mL | INTRAVENOUS | Status: DC | PRN

## 2018-05-10 MED ORDER — DOCUSATE SODIUM 100 MG PO CAPS
100.0000 mg | ORAL_CAPSULE | Freq: Two times a day (BID) | ORAL | Status: DC
  Administered 2018-05-10 – 2018-05-11 (×2): 100 mg via ORAL
  Filled 2018-05-10 (×2): qty 1

## 2018-05-10 MED ORDER — ONDANSETRON HCL 4 MG/2ML IJ SOLN: 4.0000 mg | Freq: Four times a day (QID) | INTRAMUSCULAR | Status: DC | PRN

## 2018-05-10 MED ORDER — ROCURONIUM BROMIDE 50 MG/5ML IV SOSY
PREFILLED_SYRINGE | INTRAVENOUS | Status: DC | PRN
  Administered 2018-05-10: 10 mg via INTRAVENOUS
  Administered 2018-05-10: 30 mg via INTRAVENOUS
  Administered 2018-05-10: 10 mg via INTRAVENOUS

## 2018-05-10 MED ORDER — PHENYLEPHRINE 40 MCG/ML (10ML) SYRINGE FOR IV PUSH (FOR BLOOD PRESSURE SUPPORT)
PREFILLED_SYRINGE | INTRAVENOUS | Status: AC
  Filled 2018-05-10: qty 10

## 2018-05-10 MED ORDER — CEFAZOLIN SODIUM-DEXTROSE 2-4 GM/100ML-% IV SOLN
2.0000 g | INTRAVENOUS | Status: AC
  Administered 2018-05-10: 2 g via INTRAVENOUS
  Filled 2018-05-10: qty 100

## 2018-05-10 MED ORDER — METOPROLOL SUCCINATE ER 50 MG PO TB24
50.0000 mg | ORAL_TABLET | Freq: Every day | ORAL | Status: DC
  Administered 2018-05-11: 50 mg via ORAL
  Filled 2018-05-10: qty 1

## 2018-05-10 MED ORDER — FENTANYL CITRATE (PF) 100 MCG/2ML IJ SOLN
INTRAMUSCULAR | Status: DC | PRN
  Administered 2018-05-10 (×2): 50 ug via INTRAVENOUS
  Administered 2018-05-10 (×2): 25 ug via INTRAVENOUS
  Administered 2018-05-10: 100 ug via INTRAVENOUS
  Administered 2018-05-10 (×2): 25 ug via INTRAVENOUS

## 2018-05-10 MED ORDER — POLYETHYLENE GLYCOL 3350 17 G PO PACK: 17.0000 g | PACK | Freq: Every day | ORAL | Status: DC | PRN

## 2018-05-10 MED ORDER — HYDROMORPHONE HCL 1 MG/ML IJ SOLN: 0.2500 mg | INTRAMUSCULAR | Status: DC | PRN

## 2018-05-10 MED ORDER — MENTHOL 3 MG MT LOZG
1.0000 | LOZENGE | OROMUCOSAL | Status: DC | PRN
  Administered 2018-05-10: 3 mg via ORAL
  Filled 2018-05-10: qty 9

## 2018-05-10 MED ORDER — ONDANSETRON HCL 4 MG/2ML IJ SOLN
INTRAMUSCULAR | Status: AC
  Filled 2018-05-10: qty 2

## 2018-05-10 MED ORDER — CHLORHEXIDINE GLUCONATE 4 % EX LIQD: 60.0000 mL | Freq: Once | CUTANEOUS | Status: DC

## 2018-05-10 MED ORDER — LIDOCAINE 2% (20 MG/ML) 5 ML SYRINGE
INTRAMUSCULAR | Status: DC | PRN
  Administered 2018-05-10: 100 mg via INTRAVENOUS

## 2018-05-10 MED ORDER — ONDANSETRON HCL 4 MG/2ML IJ SOLN
INTRAMUSCULAR | Status: DC | PRN
  Administered 2018-05-10: 4 mg via INTRAVENOUS

## 2018-05-10 MED ORDER — CEFAZOLIN SODIUM-DEXTROSE 1-4 GM/50ML-% IV SOLN
1.0000 g | Freq: Three times a day (TID) | INTRAVENOUS | Status: AC
  Administered 2018-05-10 (×2): 1 g via INTRAVENOUS
  Filled 2018-05-10 (×2): qty 50

## 2018-05-10 MED ORDER — LACTATED RINGERS IV SOLN
INTRAVENOUS | Status: DC | PRN
  Administered 2018-05-10 (×2): via INTRAVENOUS

## 2018-05-10 MED ORDER — ESMOLOL HCL 100 MG/10ML IV SOLN
INTRAVENOUS | Status: AC
  Filled 2018-05-10: qty 10

## 2018-05-10 MED ORDER — OXYCODONE HCL 5 MG PO TABS
5.0000 mg | ORAL_TABLET | ORAL | Status: DC | PRN
  Administered 2018-05-10 – 2018-05-11 (×4): 5 mg via ORAL
  Filled 2018-05-10 (×4): qty 1

## 2018-05-10 MED ORDER — SODIUM CHLORIDE 0.9 % IV SOLN
INTRAVENOUS | Status: DC
  Administered 2018-05-10: 11:00:00 via INTRAVENOUS

## 2018-05-10 MED ORDER — METHOCARBAMOL 500 MG PO TABS
500.0000 mg | ORAL_TABLET | Freq: Four times a day (QID) | ORAL | Status: DC | PRN
  Administered 2018-05-10 – 2018-05-11 (×2): 500 mg via ORAL
  Filled 2018-05-10 (×2): qty 1

## 2018-05-10 MED ORDER — LIDOCAINE 2% (20 MG/ML) 5 ML SYRINGE
INTRAMUSCULAR | Status: AC
  Filled 2018-05-10: qty 5

## 2018-05-10 MED ORDER — SODIUM CHLORIDE 0.9% FLUSH: 3.0000 mL | Freq: Two times a day (BID) | INTRAVENOUS | Status: DC

## 2018-05-10 MED ORDER — SUCCINYLCHOLINE CHLORIDE 200 MG/10ML IV SOSY
PREFILLED_SYRINGE | INTRAVENOUS | Status: DC | PRN
  Administered 2018-05-10: 140 mg via INTRAVENOUS

## 2018-05-10 MED ORDER — OXYCODONE HCL 5 MG PO TABS: 5.0000 mg | ORAL_TABLET | Freq: Once | ORAL | Status: DC | PRN

## 2018-05-10 MED ORDER — ALLOPURINOL 300 MG PO TABS
300.0000 mg | ORAL_TABLET | Freq: Every day | ORAL | Status: DC
  Administered 2018-05-11: 300 mg via ORAL
  Filled 2018-05-10 (×2): qty 1

## 2018-05-10 MED ORDER — ONDANSETRON HCL 4 MG PO TABS: 4.0000 mg | ORAL_TABLET | Freq: Four times a day (QID) | ORAL | Status: DC | PRN

## 2018-05-10 MED ORDER — METHOCARBAMOL 1000 MG/10ML IJ SOLN
500.0000 mg | Freq: Four times a day (QID) | INTRAVENOUS | Status: DC | PRN
  Filled 2018-05-10: qty 5

## 2018-05-10 MED ORDER — 0.9 % SODIUM CHLORIDE (POUR BTL) OPTIME
TOPICAL | Status: DC | PRN
  Administered 2018-05-10: 1000 mL

## 2018-05-10 MED ORDER — HYDROMORPHONE HCL 1 MG/ML IJ SOLN
0.5000 mg | INTRAMUSCULAR | Status: DC | PRN
  Administered 2018-05-10: 0.5 mg via INTRAVENOUS
  Filled 2018-05-10: qty 0.5

## 2018-05-10 MED ORDER — BUPIVACAINE HCL (PF) 0.25 % IJ SOLN
INTRAMUSCULAR | Status: AC
  Filled 2018-05-10: qty 30

## 2018-05-10 MED ORDER — PROPOFOL 10 MG/ML IV BOLUS
INTRAVENOUS | Status: AC
  Filled 2018-05-10: qty 40

## 2018-05-10 MED ORDER — OXYCODONE HCL 5 MG/5ML PO SOLN: 5.0000 mg | Freq: Once | ORAL | Status: DC | PRN

## 2018-05-10 MED ORDER — PHENYLEPHRINE 40 MCG/ML (10ML) SYRINGE FOR IV PUSH (FOR BLOOD PRESSURE SUPPORT)
PREFILLED_SYRINGE | INTRAVENOUS | Status: DC | PRN
  Administered 2018-05-10: 80 ug via INTRAVENOUS

## 2018-05-10 MED ORDER — ROCURONIUM BROMIDE 50 MG/5ML IV SOSY
PREFILLED_SYRINGE | INTRAVENOUS | Status: AC
  Filled 2018-05-10: qty 5

## 2018-05-10 MED ORDER — DEXAMETHASONE SODIUM PHOSPHATE 10 MG/ML IJ SOLN
INTRAMUSCULAR | Status: AC
  Filled 2018-05-10: qty 1

## 2018-05-10 MED ORDER — SODIUM CHLORIDE 0.9 % IV SOLN: 250.0000 mL | INTRAVENOUS | Status: DC

## 2018-05-10 MED ORDER — ACETAMINOPHEN 10 MG/ML IV SOLN: 1000.0000 mg | Freq: Once | INTRAVENOUS | Status: DC | PRN

## 2018-05-10 SURGICAL SUPPLY — 44 items
BUR ROUND FLUTED 4 SOFT TCH (BURR) ×2 IMPLANT
BUR ROUND FLUTED 4MM SOFT TCH (BURR) ×1
CANISTER SUCT 3000ML PPV (MISCELLANEOUS) ×3 IMPLANT
COVER SURGICAL LIGHT HANDLE (MISCELLANEOUS) ×3 IMPLANT
COVER WAND RF STERILE (DRAPES) ×3 IMPLANT
DECANTER SPIKE VIAL GLASS SM (MISCELLANEOUS) ×3 IMPLANT
DRAPE HALF SHEET 40X57 (DRAPES) ×6 IMPLANT
DRAPE MICROSCOPE LEICA (MISCELLANEOUS) ×3 IMPLANT
DRAPE SURG 17X23 STRL (DRAPES) ×3 IMPLANT
DRSG EMULSION OIL 3X3 NADH (GAUZE/BANDAGES/DRESSINGS) ×3 IMPLANT
DRSG PAD ABDOMINAL 8X10 ST (GAUZE/BANDAGES/DRESSINGS) ×3 IMPLANT
DURAPREP 26ML APPLICATOR (WOUND CARE) ×3 IMPLANT
ELECT REM PT RETURN 9FT ADLT (ELECTROSURGICAL) ×3
ELECTRODE REM PT RTRN 9FT ADLT (ELECTROSURGICAL) ×1 IMPLANT
GAUZE SPONGE 4X4 12PLY STRL (GAUZE/BANDAGES/DRESSINGS) ×3 IMPLANT
GLOVE BIOGEL PI IND STRL 8 (GLOVE) ×2 IMPLANT
GLOVE BIOGEL PI INDICATOR 8 (GLOVE) ×4
GLOVE ORTHO TXT STRL SZ7.5 (GLOVE) ×9 IMPLANT
GOWN STRL REUS W/ TWL LRG LVL3 (GOWN DISPOSABLE) ×2 IMPLANT
GOWN STRL REUS W/ TWL XL LVL3 (GOWN DISPOSABLE) ×1 IMPLANT
GOWN STRL REUS W/TWL 2XL LVL3 (GOWN DISPOSABLE) ×3 IMPLANT
GOWN STRL REUS W/TWL LRG LVL3 (GOWN DISPOSABLE) ×4
GOWN STRL REUS W/TWL XL LVL3 (GOWN DISPOSABLE) ×2
KIT BASIN OR (CUSTOM PROCEDURE TRAY) ×3 IMPLANT
KIT TURNOVER KIT B (KITS) ×3 IMPLANT
MANIFOLD NEPTUNE II (INSTRUMENTS) ×3 IMPLANT
NEEDLE HYPO 25GX1X1/2 BEV (NEEDLE) ×3 IMPLANT
NEEDLE SPNL 18GX3.5 QUINCKE PK (NEEDLE) ×3 IMPLANT
NS IRRIG 1000ML POUR BTL (IV SOLUTION) ×3 IMPLANT
PACK LAMINECTOMY ORTHO (CUSTOM PROCEDURE TRAY) ×3 IMPLANT
PAD ARMBOARD 7.5X6 YLW CONV (MISCELLANEOUS) ×9 IMPLANT
PATTIES SURGICAL .5 X.5 (GAUZE/BANDAGES/DRESSINGS) IMPLANT
PATTIES SURGICAL .75X.75 (GAUZE/BANDAGES/DRESSINGS) ×3 IMPLANT
STAPLER VISISTAT 35W (STAPLE) ×3 IMPLANT
SURGIFLO W/THROMBIN 8M KIT (HEMOSTASIS) ×3 IMPLANT
SUT VIC AB 0 CT1 27 (SUTURE) ×2
SUT VIC AB 0 CT1 27XBRD ANBCTR (SUTURE) ×1 IMPLANT
SUT VIC AB 1 CTX 36 (SUTURE) ×2
SUT VIC AB 1 CTX36XBRD ANBCTR (SUTURE) ×1 IMPLANT
SUT VIC AB 2-0 CT1 27 (SUTURE) ×2
SUT VIC AB 2-0 CT1 TAPERPNT 27 (SUTURE) ×1 IMPLANT
SUT VIC AB 3-0 X1 27 (SUTURE) ×3 IMPLANT
TAPE CLOTH SURG 4X10 WHT LF (GAUZE/BANDAGES/DRESSINGS) ×3 IMPLANT
TOWEL OR 17X26 10 PK STRL BLUE (TOWEL DISPOSABLE) ×3 IMPLANT

## 2018-05-10 NOTE — Transfer of Care (Signed)
Immediate Anesthesia Transfer of Care Note  Patient: Lucas Morales  Procedure(s) Performed: redo lumbar decompression L3-4 (N/A Back)  Patient Location: PACU  Anesthesia Type:General  Level of Consciousness: awake, alert  and oriented  Airway & Oxygen Therapy: Patient Spontanous Breathing and Patient connected to face mask oxygen  Post-op Assessment: Report given to RN and Post -op Vital signs reviewed and stable  Post vital signs: Reviewed and stable  Last Vitals:  Vitals Value Taken Time  BP 138/70 05/10/2018  9:53 AM  Temp    Pulse 84 05/10/2018  9:53 AM  Resp 15 05/10/2018  9:53 AM  SpO2 99 % 05/10/2018  9:53 AM  Vitals shown include unvalidated device data.  Last Pain:  Vitals:   05/10/18 0614  TempSrc: Oral  PainSc: 7       Patients Stated Pain Goal: 4 (56/81/27 5170)  Complications: No apparent anesthesia complications

## 2018-05-10 NOTE — Interval H&P Note (Signed)
History and Physical Interval Note:  05/10/2018 4:09 AM  Lucas Morales  has presented today for surgery, with the diagnosis of recurrent L3-4 severe stenosis  The various methods of treatment have been discussed with the patient and family. After consideration of risks, benefits and other options for treatment, the patient has consented to  Procedure(s): lumbar decompression L3-4 recurrent stenosis (N/A) as a surgical intervention .  The patient's history has been reviewed, patient examined, no change in status, stable for surgery.  I have reviewed the patient's chart and labs.  Questions were answered to the patient's satisfaction.     [name]

## 2018-05-10 NOTE — Anesthesia Procedure Notes (Signed)
Procedure Name: Intubation Date/Time: 05/10/2018 7:35 AM Performed by: Genelle Bal, CRNA Pre-anesthesia Checklist: Patient identified, Emergency Drugs available, Suction available and Patient being monitored Patient Re-evaluated:Patient Re-evaluated prior to induction Oxygen Delivery Method: Circle system utilized Preoxygenation: Pre-oxygenation with 100% oxygen Induction Type: IV induction and Rapid sequence Ventilation: Mask ventilation without difficulty Laryngoscope Size: Glidescope and 4 Grade View: Grade I Tube type: Oral Number of attempts: 1 Airway Equipment and Method: Stylet and Video-laryngoscopy Placement Confirmation: ETT inserted through vocal cords under direct vision,  positive ETCO2 and breath sounds checked- equal and bilateral Secured at: 24 cm Tube secured with: Tape Dental Injury: Teeth and Oropharynx as per pre-operative assessment  Comments: Elective glidescope intubation as utilized per prior anesthetic records

## 2018-05-10 NOTE — Op Note (Addendum)
Preop diagnosis: Recurrent severe spinal stenosis L3-4.  Postop diagnosis: Same  Procedure:reexploration   Bilateral  Decompression for recurrent stenosis L3-4 level. Operative microscope.  Partial facetectomy bilateral (reexploration )  Surgeon: Rodell Perna, MD  Assistant: Benjiman Core, PA-C medically necessary and present for the entire procedure  Anesthesia General plus Marcaine local  EBL: 299 cc  Complications: None.  Brief history 85 year old male previous decompression L2-3 L3-4 for severe stenosis in 2011.  He had progressive symptoms of neurogenic claudication and MRI scan showed recurrent severe stenosis at the L3-4 level.  He did fail conservative treatment including therapy epidurals activity modification and was having to take narcotic pain medication.  Procedure: After induction of general esthesia preoperative Ancef prophylaxis patient was placed prone on chest rolls careful padding positioning arms at 9090 yellow pads.  Timeout procedure was completed.  DuraPrep usual area squared with towels Betadine Steri-Drape application and laminectomy sheet drapes.  Spinal needle was initially placed 18gauge the expected level looking at his old incision.  Initial placement was at the 2 3 level and top needle was moved down repeat x-ray was taken skin was marked and incision was made at the midline.  Subperiosteal dissection onto the spinous process and lamina above and below the level decompression was performed.  Repeat x-ray with 2 Kocher clamps applied was obtained and then planned nerve decompression was adjusted appropriately.  Lamina was thinned at L4 portion of the spinous process was harvested thinning the lamina.  Dura was noted carefully protected with patties microscope was draped brought in and extensive scar tissue was released off the dura removing chunks of thick ligament progressing cephalad.  There was changes consistent with previous epidural steroid injections and granulation  tissue as well as thickened ligament suggesting possibly facet cyst in addition to the hypertrophic ligamentum that it recurred.  Thick chunks of ligament were removed decompressing the dura.  Portion of the facet was removed with an osteotome and continue decompression until the dural tube was round.  Painful force placed above and below the level of decompression confirming that we had progressed above the level of the L3-4 disc space and then down almost to the inferior endplate of L4.  Remaining overhanging spurs removed some Surgi-Flo was placed in the gutters as well as small patties operative field epidural space was dry disc was palpated was firm.  Repeat the day irrigation was performed in standard closure with #1 Vicryl deep fascia 2-0 Vicryl subcutaneous tissue, skin staple closure Marcaine infiltration postop dressing and transferred recovery room.

## 2018-05-10 NOTE — Evaluation (Signed)
Physical Therapy Evaluation Patient Details Name: Lucas Morales MRN: [id number] DOB: 01-14-1934 Today's Date: 05/10/2018   History of Present Illness  Pt is an 85 yo male s/p L3/4 lumbar decompression surgery. PMH: anxiety, CAD, bilateral TKA, GERD, gout, DM, HTN, A-fib.   Clinical Impression   Pt received in bed, willing to participate in therapy. Overall a very pleasant man, but lacks safety awareness. Education provided for precautions- pt repeats them back but has difficulty following them. Education provided for log roll and car transfer to pt and son. Pt requires supervision to minA for bed mobility, min guard for sit to stand from elevated surfaces with grab bars. Ambulation with RW with min guard for safety. Pt very unsafe on stairs (see details below). Concerned about pt being able to get into home safely due to lack of rails on entrance stairs, and concerned about him being able to get off of toilet, as he uses a cabinet at baseline to pull himself to standing. Recommending SNF for DC due to repeated safety concerns including maintaining back precautions and being able to safely get in and out of home as well as move around in the home safely. Pt declining SNF at this time; if he continues to decline, will require HHPT and supervision for mobility/OOB.     Follow Up Recommendations SNF(Recommending SNF- if pt declines, will need HHPT and supervision for mobility/OOB)    Equipment Recommendations  Other (comment)(defer to next venue (pt also has RW, 3in1))    Recommendations for Other Services       Precautions / Restrictions Precautions Precautions: Fall;Back Precaution Booklet Issued: No Precaution Comments: Precautions reviewed verbally- pt able to repeat them back but difficulty following them. Restrictions Weight Bearing Restrictions: No      Mobility  Bed Mobility Overal bed mobility: Needs Assistance Bed Mobility: Rolling;Sidelying to Sit;Sit to  Sidelying Rolling: Supervision Sidelying to sit: Supervision     Sit to sidelying: Min assist General bed mobility comments: Education provided on log roll- pt able to roll without physical assist, v/c to keep knees in line with nose to prevent twisting. Able to elevate trunk without assistance, v/c for maintaining precautions. Required minA for LE management for sit to sidelying, repeated v/c and tactile cues to not twist. Difficulty following cues.  Transfers Overall transfer level: Needs assistance Equipment used: Rolling walker (2 wheeled) Transfers: Sit to/from Stand Sit to Stand: Min guard;From elevated surface         General transfer comment: Pt required bed elevated to stand from bed, min guard. Able to stand from low toilet with heavy use of grab bars in bathroom to lift self- maintained straight back.   Ambulation/Gait Ambulation/Gait assistance: Min guard Gait Distance (Feet): 200 Feet Assistive device: Rolling walker (2 wheeled) Gait Pattern/deviations: Step-through pattern;Antalgic Gait velocity: decreased   General Gait Details: Pt able to ambulate 400 ft in hallway using RW. He wanted to walk without RW, balance was fair during standing march, but walk was more coordinated and smooth when using RW.   Stairs Stairs: Yes Stairs assistance: Min guard Stair Management: Two rails;Step to pattern Number of Stairs: 3 General stair comments: Pt demonstrates method of getting up stairs to enter without railing- by holding onto a trashcan and the door frame. Education provided that this is an unsafe method. Pt able to come down stairs backwards with close guard. Heavy use of rails throughout.  Wheelchair Mobility    Modified Rankin (Stroke Patients Only)  Balance Overall balance assessment: Mild deficits observed, not formally tested                                           Pertinent Vitals/Pain Pain Assessment: Faces Faces Pain Scale:  Hurts a little bit Pain Location: throat Pain Descriptors / Indicators: Sore Pain Intervention(s): Monitored during session    Home Living Family/patient expects to be discharged to:: Private residence Living Arrangements: Alone Available Help at Discharge: Family;Available 24 hours/day;Other (Comment)(son available for only a few days, lives out of town) Type of Home: House Home Access: Stairs to enter Entrance Stairs-Rails: None Technical brewer of Steps: 3 Home Layout: Two level;Able to live on main level with bedroom/bathroom Home Equipment: Gilford Rile - 2 wheels;Cane - single point;Bedside commode;Crutches      Prior Function Level of Independence: Independent with assistive device(s)         Comments: Pt reports he has been using RW for about 4 weeks prior to surgery due to increased pain level     Hand Dominance   Dominant Hand: Right    Extremity/Trunk Assessment   Upper Extremity Assessment Upper Extremity Assessment: Overall WFL for tasks assessed    Lower Extremity Assessment Lower Extremity Assessment: Overall WFL for tasks assessed    Cervical / Trunk Assessment Cervical / Trunk Assessment: Kyphotic  Communication   Communication: No difficulties  Cognition Arousal/Alertness: Awake/alert Behavior During Therapy: Impulsive Overall Cognitive Status: Impaired/Different from baseline Area of Impairment: Safety/judgement                         Safety/Judgement: Decreased awareness of safety     General Comments: Pt generally unaware of safety and deficits. Had difficulty following precautions despite being able to repeat them back to PT. Follows commands well.      General Comments      Exercises     Assessment/Plan    PT Assessment Patient needs continued PT services  PT Problem List Decreased strength;Decreased balance;Decreased knowledge of precautions;Decreased mobility;Decreased knowledge of use of DME;Decreased safety  awareness       PT Treatment Interventions DME instruction;Functional mobility training;Balance training;Patient/family education;Gait training;Therapeutic activities;Neuromuscular re-education;Stair training;Therapeutic exercise    PT Goals (Current goals can be found in the Care Plan section)  Acute Rehab PT Goals Patient Stated Goal: go home safely PT Goal Formulation: With patient Time For Goal Achievement: 05/24/18 Potential to Achieve Goals: Fair    Frequency Min 5X/week   Barriers to discharge Inaccessible home environment;Decreased caregiver support      Co-evaluation               AM-PAC PT "6 Clicks" Mobility  Outcome Measure Help needed turning from your back to your side while in a flat bed without using bedrails?: A Little Help needed moving from lying on your back to sitting on the side of a flat bed without using bedrails?: A Little Help needed moving to and from a bed to a chair (including a wheelchair)?: A Little Help needed standing up from a chair using your arms (e.g., wheelchair or bedside chair)?: A Lot Help needed to walk in hospital room?: A Little Help needed climbing 3-5 steps with a railing? : A Lot 6 Click Score: 16    End of Session Equipment Utilized During Treatment: Gait belt Activity Tolerance: Patient tolerated treatment well Patient left:  in bed;with call bell/phone within reach;with family/visitor present   PT Visit Diagnosis: Unsteadiness on feet (R26.81);Pain;Other abnormalities of gait and mobility (R26.89) Pain - part of body: (back)    Time:  -      Charges:              Ronnell Guadalajara, SPT   Ronnell Guadalajara 05/10/2018, 4:12 PM

## 2018-05-10 NOTE — Progress Notes (Signed)
PT Cancellation Note  Patient Details Name: Lucas Morales MRN: [id number] DOB: 1934/02/28   Cancelled Treatment:    Reason Eval/Treat Not Completed: Patient declined, no reason specified attempted to work with patient, family requests PT return later as he just got to sleep. Will attempt to return if time/schedule allow.    Deniece Ree PT, DPT, CBIS  Supplemental Physical Therapist [facility]    Pager [phone] Acute Rehab Office [phone]

## 2018-05-10 NOTE — Anesthesia Postprocedure Evaluation (Signed)
Anesthesia Post Note  Patient: Alaster L [place]sbrook  Procedure(s) Performed: redo lumbar decompression L3-4 (N/A Back)     Patient location during evaluation: PACU Anesthesia Type: General Level of consciousness: awake and alert Pain management: pain level controlled Vital Signs Assessment: post-procedure vital signs reviewed and stable Respiratory status: spontaneous breathing, nonlabored ventilation and respiratory function stable Cardiovascular status: blood pressure returned to baseline and stable Postop Assessment: no apparent nausea or vomiting Anesthetic complications: no    Last Vitals:  Vitals:   05/10/18 1040 05/10/18 1106  BP:  (!) 146/75  Pulse:  78  Resp:  18  Temp: 37.6 C 36.5 C  SpO2:      Last Pain:  Vitals:   05/10/18 1106  TempSrc: Oral  PainSc:                  Brennan Bailey

## 2018-05-11 ENCOUNTER — Encounter (HOSPITAL_COMMUNITY): Payer: Self-pay | Admitting: Orthopaedic Surgery

## 2018-05-11 DIAGNOSIS — M48062 Spinal stenosis, lumbar region with neurogenic claudication: Secondary | ICD-10-CM | POA: Diagnosis not present

## 2018-05-11 NOTE — Evaluation (Signed)
Occupational Therapy Evaluation Patient Details Name: Lucas Morales MRN: [id number] DOB: Jun 05, 1933 Today's Date: 05/11/2018    History of Present Illness Pt is an 85 yo male s/p L3/4 lumbar decompression surgery. PMH: anxiety, CAD, bilateral TKA, GERD, gout, DM, HTN, A-fib.   Clinical Impression   Patient is s/p L3/4 lumbar decompression surgery resulting in the deficits listed below (see OT Problem List). The patient prior to surgery was limited by pain. The patient is planing to go home with the son coming into town and then daughter near by to assist as needed. The patient at this time requires supervision with AE ( reacher, sock aid) to complete LE tasks with cues to follow precautions. Patient reported they have 2 walkers, shower chair, 3 in 1 commode at home. It was recommended to have grab bar in the walk in shower area to decrease fall risk with showers.      Follow Up Recommendations  No OT follow up 24/7 supervision inially when home    Equipment Recommendations  None recommended by OT    Recommendations for Other Services       Precautions / Restrictions Precautions Precautions: Fall;Back Precaution Booklet Issued: Yes (comment) Precaution Comments: Precautions reviewed verbally- pt able to repeat them back but difficulty following them. Restrictions Weight Bearing Restrictions: No      Mobility Bed Mobility Overal bed mobility: Needs Assistance Bed Mobility: Rolling;Sidelying to Sit;Sit to Sidelying Rolling: Supervision Sidelying to sit: Supervision     Sit to sidelying: Supervision General bed mobility comments: cues fo precautions  Transfers Overall transfer level: Needs assistance Equipment used: Rolling walker (2 wheeled) Transfers: Sit to/from Stand Sit to Stand: Supervision         General transfer comment: No physical assist required, supervision for safety, VCs for hand placement    Balance Overall balance assessment: Mild deficits  observed, not formally tested                                         ADL either performed or assessed with clinical judgement   ADL Overall ADL's : Needs assistance/impaired Eating/Feeding: Independent   Grooming: Wash/dry hands;Supervision/safety   Upper Body Bathing: Supervision/ safety;Cueing for safety   Lower Body Bathing: Supervison/ safety;Cueing for safety;Cueing for sequencing   Upper Body Dressing : Supervision/safety;Cueing for safety   Lower Body Dressing: Supervision/safety;Cueing for safety;Cueing for sequencing   Toilet Transfer: Supervision/safety   Toileting- Clothing Manipulation and Hygiene: Supervision/safety   Tub/ Shower Transfer: Min guard;Cueing for safety;Cueing for sequencing   Functional mobility during ADLs: Min guard;Rolling walker       Vision Baseline Vision/History: Wears glasses Wears Glasses: Reading only Patient Visual Report: No change from baseline Vision Assessment?: No apparent visual deficits     Perception Perception Perception Tested?: No   Praxis Praxis Praxis tested?: Not tested    Pertinent Vitals/Pain Pain Assessment: 0-10 Pain Score: 6  Faces Pain Scale: Hurts little more Pain Location: back and buttocks Pain Descriptors / Indicators: Sore Pain Intervention(s): Limited activity within patient's tolerance;Monitored during session;Repositioned     Hand Dominance Right   Extremity/Trunk Assessment Upper Extremity Assessment Upper Extremity Assessment: Overall WFL for tasks assessed   Lower Extremity Assessment Lower Extremity Assessment: Defer to PT evaluation   Cervical / Trunk Assessment Cervical / Trunk Assessment: Kyphotic   Communication Communication Communication: No difficulties   Cognition Arousal/Alertness: Awake/alert Behavior During Therapy:  WFL for tasks assessed/performed Overall Cognitive Status: Within Functional Limits for tasks assessed Area of Impairment:  Safety/judgement                         Safety/Judgement: Decreased awareness of safety         General Comments  educated pm car transfer and mobility expectations    Exercises     Shoulder Instructions      Home Living Family/patient expects to be discharged to:: Private residence Living Arrangements: Alone Available Help at Discharge: Family;Available 24 hours/day;Other (Comment) Type of Home: House Home Access: Stairs to enter;Other (comment) Entrance Stairs-Number of Steps: 3 Entrance Stairs-Rails: None Home Layout: Two level Alternate Level Stairs-Number of Steps: flight down- entrance is on upper level Alternate Level Stairs-Rails: Right Bathroom Shower/Tub: Occupational psychologist: Standard Bathroom Accessibility: Yes How Accessible: Accessible via walker Home Equipment: [place] - 2 wheels;Cane - single point;Bedside commode;Crutches;Adaptive equipment;Shower Theme park manager: Reacher        Prior Functioning/Environment Level of Independence: Independent with assistive device(s)        Comments: Pt reports he has been using RW for about 4 weeks prior to surgery due to increased pain level        OT Problem List: Decreased strength;Decreased activity tolerance;Decreased safety awareness;Decreased knowledge of precautions      OT Treatment/Interventions: Self-care/ADL training;DME and/or AE instruction;Therapeutic activities;Patient/family education    OT Goals(Current goals can be found in the care plan section) Acute Rehab OT Goals Patient Stated Goal: go home safely OT Goal Formulation: With patient Time For Goal Achievement: 05/18/18 Potential to Achieve Goals: Good  OT Frequency:   Barriers to D/C:            Co-evaluation              AM-PAC OT "6 Clicks" Daily Activity     Outcome Measure Help from another person eating meals?: None Help from another person taking care of personal grooming?: None Help  from another person toileting, which includes using toliet, bedpan, or urinal?: None Help from another person bathing (including washing, rinsing, drying)?: A Little Help from another person to put on and taking off regular upper body clothing?: None Help from another person to put on and taking off regular lower body clothing?: A Little 6 Click Score: 22   End of Session Equipment Utilized During Treatment: Rolling walker  Activity Tolerance: Patient tolerated treatment well;Patient limited by pain Patient left: in bed;with call bell/phone within reach  OT Visit Diagnosis: Unsteadiness on feet (R26.81)                Time: 1856-3149 OT Time Calculation (min): 37 min Charges:  OT General Charges $OT Visit: 1 Visit OT Evaluation $OT Eval Low Complexity: 1 Low OT Treatments $Self Care/Home Management : 8-22 mins  Joeseph Amor OTR/L  Acute Rehab Services  [phone] office number [phone] pager number   Joeseph Amor 05/11/2018, 8:26 AM

## 2018-05-11 NOTE — Progress Notes (Signed)
Physical Therapy Treatment Patient Details Name: Lucas Morales MRN: [id number] DOB: Aug 06, 1933 Today's Date: 05/11/2018    History of Present Illness Pt is an 85 yo male s/p L3/4 lumbar decompression surgery. PMH: anxiety, CAD, bilateral TKA, GERD, gout, DM, HTN, A-fib.    PT Comments    Patient seen for mobility progression s/p spinal surgery. Mobilizing well this am. Educated patient on precautions, mobility expectations, safety and car transfers. Anticipate patient will be safe for d/c home with family assist. Patient states family available to stay with patient and assist for first 5 days.     Follow Up Recommendations  No PT follow up;Supervision/Assistance - 24 hour(will have family for first 5 days)     Equipment Recommendations  (pt has RW, 3in1))    Recommendations for Other Services       Precautions / Restrictions Precautions Precautions: Fall;Back Precaution Booklet Issued: No Precaution Comments: Precautions reviewed verbally- pt able to repeat them back but difficulty following them. Restrictions Weight Bearing Restrictions: No    Mobility  Bed Mobility Overal bed mobility: Needs Assistance Bed Mobility: Rolling;Sidelying to Sit;Sit to Sidelying Rolling: Supervision Sidelying to sit: Supervision     Sit to sidelying: Supervision General bed mobility comments: Supervision for safety with one cue for positioning when elevating LE up to bed  Transfers Overall transfer level: Needs assistance Equipment used: Rolling walker (2 wheeled) Transfers: Sit to/from Stand Sit to Stand: Supervision         General transfer comment: No physical assist required, supervision for safety, VCs for hand placement  Ambulation/Gait Ambulation/Gait assistance: Supervision Gait Distance (Feet): 410 Feet Assistive device: Rolling walker (2 wheeled) Gait Pattern/deviations: Step-through pattern;Antalgic Gait velocity: decreased   General Gait Details: VCs for  increased cadence   Stairs Stairs: Yes Stairs assistance: Supervision Stair Management: One rail Left Number of Stairs: 12 General stair comments: could sequecing and technique, no physical assist required. Supervision for safety   Wheelchair Mobility    Modified Rankin (Stroke Patients Only)       Balance Overall balance assessment: Mild deficits observed, not formally tested                                          Cognition Arousal/Alertness: Awake/alert Behavior During Therapy: WFL for tasks assessed/performed Overall Cognitive Status: Within Functional Limits for tasks assessed                                        Exercises      General Comments General comments (skin integrity, edema, etc.): educated pm car transfer and mobility expectations      Pertinent Vitals/Pain Pain Assessment: Faces Faces Pain Scale: Hurts little more Pain Location: back and buttocks Pain Descriptors / Indicators: Sore Pain Intervention(s): Monitored during session    Home Living                      Prior Function            PT Goals (current goals can now be found in the care plan section) Acute Rehab PT Goals Patient Stated Goal: go home safely PT Goal Formulation: With patient Time For Goal Achievement: 05/24/18 Potential to Achieve Goals: Good Progress towards PT goals: Progressing toward goals    Frequency  Min 5X/week      PT Plan Discharge plan needs to be updated    Co-evaluation              AM-PAC PT "6 Clicks" Mobility   Outcome Measure  Help needed turning from your back to your side while in a flat bed without using bedrails?: None Help needed moving from lying on your back to sitting on the side of a flat bed without using bedrails?: None Help needed moving to and from a bed to a chair (including a wheelchair)?: A Little Help needed standing up from a chair using your arms (e.g., wheelchair or  bedside chair)?: A Little Help needed to walk in hospital room?: A Little Help needed climbing 3-5 steps with a railing? : A Little 6 Click Score: 20    End of Session Equipment Utilized During Treatment: Gait belt Activity Tolerance: Patient tolerated treatment well Patient left: in bed;with call bell/phone within reach;with family/visitor present   PT Visit Diagnosis: Unsteadiness on feet (R26.81);Pain;Other abnormalities of gait and mobility (R26.89) Pain - part of body: (back)     Time: 2330-0762 PT Time Calculation (min) (ACUTE ONLY): 17 min  Charges:  $Gait Training: 8-22 mins                     Alben Deeds, PT DPT  Board Certified Neurologic Specialist [place] Pager [phone] Office [place] 05/11/2018, 7:22 AM

## 2018-05-11 NOTE — Progress Notes (Signed)
   Subjective:  Patient reports pain as mild.   Has walked with PT.   Objective:   VITALS:   Vitals:   05/10/18 1556 05/10/18 1923 05/10/18 2205 05/11/18 0415  BP: 133/69 137/69 (!) 144/66 (!) 151/73  Pulse: 70 81 86 76  Resp: 19 20  20   Temp: 97.9 F (36.6 C) 98.6 F (37 C) 98.1 F (36.7 C) 97.7 F (36.5 C)  TempSrc: Oral Oral Oral Oral  SpO2: 96% 95% 97% 97%  Weight:      Height:        Neurologically intact Neurovascular intact Sensation intact distally Intact pulses distally Dorsiflexion/Plantar flexion intact Incision: dressing C/D/I and no drainage No cellulitis present   Lab Results  Component Value Date   WBC 7.4 05/03/2018   HGB 13.5 05/03/2018   HCT 41.0 05/03/2018   MCV 96.0 05/03/2018   PLT 240 05/03/2018     Assessment/Plan:  1 Day Post-Op   He is doing well overall.  Pain is well controlled. Will work with PT once more on stair training.  He is comfortable with leaving today after that.   Eduard Roux 05/11/2018, 7:01 AM [phone]

## 2018-05-11 NOTE — Discharge Summary (Signed)
Physician Discharge Summary      Patient ID: Lucas Morales MRN: [id number] DOB/AGE: 1933/07/12 85 y.o.  Admit date: 05/10/2018 Discharge date: 05/11/2018  Admission Diagnoses:  <principal problem not specified>  Discharge Diagnoses:  Active Problems:   Lumbar stenosis   Past Medical History:  Diagnosis Date  . Adenomatous colon polyp   . Alpha-1-antitrypsin deficiency ([place])   . Alpha-1-antitrypsin deficiency carrier   . Anemia   . Anxiety   . Arthritis    "knees; bad in my back" (09/01/2014)  . Bruises easily   . Chronic bronchitis ([place])    "get it pretty much q yr" (09/01/2014)  . Chronic lower back pain   . Coronary artery disease   . Depression    "related to health" (09/01/2014)  . Diarrhea    has had a part of colon removed  . Diverticulitis   . Diverticulosis   . Dyspnea on exertion   . Dysrhythmia    bradycardia in the 30's (01/11/2012).. Afib briefly after CABG  . Esophageal dysmotility   . Esophageal stricture   . Gastritis   . GERD (gastroesophageal reflux disease)    takes Pantoprazole daily  . Gout   . Hemorrhoids   . Hiatal hernia   . History of colon polyps   . Hyperlipidemia    doesn't require meds  . Hypertension    takes Losartan daily; current not taking   . IC (interstitial cystitis)   . Insomnia    takes Nortrypyline nightly  . Joint pain   . Joint swelling   . Macular hole of both eyes   . Nocturia   . OSA (obstructive sleep apnea)    "mild; tried CPAP; couldn't tolerate" (09/01/2014)  . Paroxysmal atrial fibrillation ([facility])   . Restless leg syndrome   . Sciatic nerve pain   . Type II diabetes mellitus ([place])    "borderline"  . Urinary frequency     Surgeries: Procedure(s): redo lumbar decompression L3-4 on 05/10/2018   Consultants (if any):   Discharged Condition: Improved  Hospital Course: Lucas Morales is an 85 y.o. male who was admitted 05/10/2018 with a diagnosis of <principal problem not specified> and went to the  operating room on 05/10/2018 and underwent the above named procedures.    He was given perioperative antibiotics:  Anti-infectives (From admission, onward)   Start     Dose/Rate Route Frequency Ordered Stop   05/10/18 1115  ceFAZolin (ANCEF) IVPB 1 g/50 mL premix     1 g 100 mL/hr over 30 Minutes Intravenous Every 8 hours 05/10/18 1100 05/10/18 2132   05/10/18 0600  ceFAZolin (ANCEF) IVPB 2g/100 mL premix     2 g 200 mL/hr over 30 Minutes Intravenous On call to O.R. 05/10/18 0543 05/10/18 0809    .  He was given sequential compression devices, early ambulation, and appropriate chemoprophylaxis for DVT prophylaxis.  He benefited maximally from the hospital stay and there were no complications.    Recent vital signs:  Vitals:   05/10/18 2205 05/11/18 0415  BP: (!) 144/66 (!) 151/73  Pulse: 86 76  Resp:  20  Temp: 98.1 F (36.7 C) 97.7 F (36.5 C)  SpO2: 97% 97%    Recent laboratory studies:  Lab Results  Component Value Date   HGB 13.5 05/03/2018   HGB 13.0 05/25/2017   HGB 12.3 (L) 08/31/2016   Lab Results  Component Value Date   WBC 7.4 05/03/2018   PLT 240 05/03/2018   Lab  Results  Component Value Date   INR 1.1 05/25/2017   Lab Results  Component Value Date   NA 138 05/03/2018   K 4.1 05/03/2018   CL 106 05/03/2018   CO2 20 (L) 05/03/2018   BUN 23 05/03/2018   CREATININE 1.15 05/03/2018   GLUCOSE 108 (H) 05/03/2018    Discharge Medications:   Allergies as of 05/11/2018      Reactions   Morphine Itching      Medication List    STOP taking these medications   HYDROcodone-acetaminophen 5-325 MG tablet Commonly known as:  NORCO/VICODIN     TAKE these medications   allopurinol 300 MG tablet Commonly known as:  ZYLOPRIM Take 300 mg by mouth daily.   aspirin EC 81 MG tablet Take 81 mg by mouth daily.   metoprolol succinate 50 MG 24 hr tablet Commonly known as:  TOPROL-XL Take 1 tablet (50 mg total) by mouth daily.   oxyCODONE-acetaminophen  5-325 MG tablet Commonly known as:  PERCOCET/ROXICET Take 1-2 tablets by mouth every 6 (six) hours as needed for severe pain.   pantoprazole 40 MG tablet Commonly known as:  PROTONIX Take 1 tablet (40 mg total) by mouth 2 (two) times daily.   tolnaftate 1 % spray Commonly known as:  TINACTIN Apply topically 3 (three) times daily.   tolnaftate 1 % cream Commonly known as:  TINACTIN Apply 1 application topically 3 (three) times daily as needed.       Diagnostic Studies: Dg Chest 2 View  Result Date: 05/04/2018 CLINICAL DATA:  Shortness of breath EXAM: CHEST - 2 VIEW COMPARISON:  December 16, 2015. FINDINGS: There is no appreciable edema or consolidation. There is a calcified granuloma in the right upper lobe. Heart size and pulmonary vascularity are normal. There is a moderate hiatal hernia. Patient is status post coronary artery bypass grafting. There is aortic atherosclerosis. There is postoperative change in the lower cervical spine. There is degenerative change in the lower thoracic spine. IMPRESSION: No appreciable edema or consolidation. Calcified granuloma right upper lobe. Stable cardiac silhouette. Moderate hiatal hernia. Aortic Atherosclerosis (ICD10-I70.0). Electronically Signed   By: Lowella Grip III M.D.   On: 05/04/2018 09:28   Dg Lumbar Spine Complete  Result Date: 05/10/2018 CLINICAL DATA:  Intraoperative imaging for lumbar surgery. EXAM: LUMBAR SPINE - COMPLETE 4+ VIEW COMPARISON:  MRI lumbar spine 03/31/2018. FINDINGS: 5 images of the lumbar spine and lateral projection are provided. Images demonstrate localization of the L3-4 and L4-5 levels on the final 2 images. No acute abnormality. IMPRESSION: Localization as above. Electronically Signed   By: Inge Rise M.D.   On: 05/10/2018 13:30    Disposition: Discharge disposition: 01-Home or Self Care       Discharge Instructions    Call MD / Call 911   Complete by:  As directed    If you experience chest  pain or shortness of breath, CALL 911 and be transported to the hospital emergency room.  If you develope a fever above 101.5 F, pus (white drainage) or increased drainage or redness at the wound, or calf pain, call your surgeon's office.   Constipation Prevention   Complete by:  As directed    Drink plenty of fluids.  Prune juice may be helpful.  You may use a stool softener, such as Colace (over the counter) 100 mg twice a day.  Use MiraLax (over the counter) for constipation as needed.   Driving restrictions   Complete by:  As directed  No driving while taking narcotic pain meds.   Increase activity slowly as tolerated   Complete by:  As directed       Follow-up Information    [name], MD.   Specialty:  Orthopedic Surgery Why:  make appointment to see Dr Lorin Mercy one week.  Contact information: [address] [place] [address] [phone]            Signed: Eduard Roux 05/11/2018, 7:02 AM

## 2018-05-11 NOTE — Progress Notes (Signed)
Patient alert and oriented, Lucas Morales's well, voiding adequate amount of urine, swallowing without difficulty, c/o pain and meds given prior to discharged for ride and discomfort. Patient discharged home with family. Script and discharged instructions given to patient. Patient and family stated understanding of instructions given. Patient has an appointment with Dr. Lorin Mercy

## 2018-05-14 ENCOUNTER — Inpatient Hospital Stay (INDEPENDENT_AMBULATORY_CARE_PROVIDER_SITE_OTHER): Payer: Medicare Other | Admitting: Orthopaedic Surgery

## 2018-05-16 ENCOUNTER — Telehealth (INDEPENDENT_AMBULATORY_CARE_PROVIDER_SITE_OTHER): Payer: Self-pay | Admitting: Orthopaedic Surgery

## 2018-05-16 ENCOUNTER — Other Ambulatory Visit (INDEPENDENT_AMBULATORY_CARE_PROVIDER_SITE_OTHER): Payer: Self-pay | Admitting: Orthopaedic Surgery

## 2018-05-16 MED ORDER — OXYCODONE-ACETAMINOPHEN 5-325 MG PO TABS: 1.0000 | ORAL_TABLET | Freq: Four times a day (QID) | ORAL | 0 refills | Status: DC | PRN

## 2018-05-16 NOTE — Progress Notes (Signed)
Cutting back to one po q 6 hrs prn # 30 percocet sent in by me.

## 2018-05-16 NOTE — Telephone Encounter (Signed)
Please advise 

## 2018-05-16 NOTE — Telephone Encounter (Signed)
Pt called asking for a refill on his pain medication Oxycodone  PT # is [phone]

## 2018-05-16 NOTE — Telephone Encounter (Signed)
Sent in and I called pt

## 2018-05-17 ENCOUNTER — Ambulatory Visit: Payer: Medicare Other | Admitting: Physician Assistant

## 2018-05-17 ENCOUNTER — Encounter (INDEPENDENT_AMBULATORY_CARE_PROVIDER_SITE_OTHER): Payer: Self-pay | Admitting: Orthopaedic Surgery

## 2018-05-17 ENCOUNTER — Ambulatory Visit (INDEPENDENT_AMBULATORY_CARE_PROVIDER_SITE_OTHER): Payer: Medicare Other | Admitting: Orthopaedic Surgery

## 2018-05-17 VITALS — BP 149/82 | HR 42 | Ht 74.0 in | Wt 224.0 lb

## 2018-05-17 DIAGNOSIS — Z9889 Other specified postprocedural states: Secondary | ICD-10-CM

## 2018-05-17 NOTE — Progress Notes (Signed)
Office Visit Note   Patient: Lucas Morales           Date of Birth: Aug 21, 1933           MRN: [id number] Visit Date: 05/17/2018              Requested by: Deland Pretty, MD [id number] [place], [place] [address] PCP: Deland Pretty, MD   Assessment & Plan: Visit Diagnoses:  1. Status post lumbar spine surgery for decompression of spinal cord     Plan: Patient's had problems with pain he was taking some pain medication before surgery and after surgery is not as effective.  I recommend he switch and take 1 Percocet every 3 hours.  He will run out quicker but I plan to check him in a week for staple removal Steri-Strip application.  Incision looks good.  He is Dealer with a cane.  He has pain from his back that radiates into his buttocks but states his legs are better than preop.  Follow-Up Instructions: No follow-ups on file.   Orders:  No orders of the defined types were placed in this encounter.  No orders of the defined types were placed in this encounter.     Procedures: No procedures performed   Clinical Data: No additional findings.   Subjective: Chief Complaint  Patient presents with  . Lower Back - Routine Post Op    05/10/2018 Redo lumbar decompression L3-4    HPI patient returns post lumbar decompression he is having problems with pain at the incision and we discussed as we did before the surgery that taking pain medication before the surgery makes the pain medication after surgery not work as well.  Staples are intact incision looks good he states his legs feel better but he does have some pain that radiates into his hips.  Review of Systems unchanged other than as mentioned above.   Objective: Vital Signs: BP (!) 149/82   Pulse (!) 42   Ht 6\' 2"  (1.88 m)   Wt 224 lb (101.6 kg)   BMI 28.76 kg/m   Physical Exam no change in general physical exam.  Ortho Exam quads are intact anterior tib gastrocsoleus is intact lumbar incisions  well-healed.  Specialty Comments:  No specialty comments available.  Imaging: No results found.   PMFS History: Patient Active Problem List   Diagnosis Date Noted  . Lumbar stenosis 05/10/2018  . Abnormal EKG 05/25/2017  . S/P cervical spinal fusion 01/25/2016  . Cervical spinal stenosis 12/20/2015  . Cervical spondylosis 12/20/2015  . Painful orthopaedic hardware left hip with chronic trochanteric bursitis 07/30/2015  . Hip bursitis 07/30/2015  . Trochanteric bursitis of left hip 07/30/2015  . Chest pain with moderate risk of acute coronary syndrome 10/19/2014  . Macular hole, left eye 09/01/2014  . Macular hole of left eye 08/28/2014  . Other pancytopenia ([place])   . Sliding hiatal hernia   . Gastric erosions   . GI bleeding 06/04/2014  . Acute GI bleeding 06/04/2014  . Orthostatic hypotension 06/04/2014  . Acute upper gastrointestinal bleeding 06/04/2014  . Other specified diabetes mellitus without complications ([place])   . Acute blood loss anemia   . Thrombocytopenia ([place])   . Closed left hip fracture ([place]) 04/03/2014  . Fracture, femur ([place]) 04/03/2014  . Femur fracture ([place]) 04/03/2014  . Upper airway cough syndrome 05/16/2013  . OSA (obstructive sleep apnea) 10/21/2012  . Macular hole 10/08/2012  . Preoperative clearance 09/30/2012  . PAF (  paroxysmal atrial fibrillation) ([place]) 01/21/2012  . Shortness of breath on exertion 01/11/2012  . Bradycardia, sinus 01/11/2012  . Hx of CABG 01/11/2012  . Essential hypertension   . Dyslipidemia   . ABDOMINAL PAIN-LLQ 02/04/2010  . HEMORRHOIDS 08/31/2008  . ESOPHAGEAL STRICTURE 08/31/2008  . GERD 08/31/2008  . Gastritis and gastroduodenitis 08/31/2008  . HIATAL HERNIA 08/31/2008  . DIVERTICULOSIS, COLON 08/31/2008  . Chronic interstitial cystitis 08/31/2008  . COLONIC POLYPS, ADENOMATOUS, HX OF 08/31/2008   Past Medical History:  Diagnosis Date  . Adenomatous colon polyp   . Alpha-1-antitrypsin deficiency ([place])   .  Alpha-1-antitrypsin deficiency carrier   . Anemia   . Anxiety   . Arthritis    "knees; bad in my back" (09/01/2014)  . Bruises easily   . Chronic bronchitis ([place])    "get it pretty much q yr" (09/01/2014)  . Chronic lower back pain   . Coronary artery disease   . Depression    "related to health" (09/01/2014)  . Diarrhea    has had a part of colon removed  . Diverticulitis   . Diverticulosis   . Dyspnea on exertion   . Dysrhythmia    bradycardia in the 30's (01/11/2012).. Afib briefly after CABG  . Esophageal dysmotility   . Esophageal stricture   . Gastritis   . GERD (gastroesophageal reflux disease)    takes Pantoprazole daily  . Gout   . Hemorrhoids   . Hiatal hernia   . History of colon polyps   . Hyperlipidemia    doesn't require meds  . Hypertension    takes Losartan daily; current not taking   . IC (interstitial cystitis)   . Insomnia    takes Nortrypyline nightly  . Joint pain   . Joint swelling   . Macular hole of both eyes   . Nocturia   . OSA (obstructive sleep apnea)    "mild; tried CPAP; couldn't tolerate" (09/01/2014)  . Paroxysmal atrial fibrillation (H[facility])   . Restless leg syndrome   . Sciatic nerve pain   . Type II diabetes mellitus ([place])    "borderline"  . Urinary frequency     Family History  Problem Relation Age of Onset  . Emphysema Mother   . Kidney disease Father   . Emphysema Maternal Grandfather   . Colon cancer Neg Hx     Past Surgical History:  Procedure Laterality Date  . [place] VITRECTOMY WITH 20 GAUGE MVR PORT FOR MACULAR HOLE Right 10/08/2012   Procedure: 25 GAUGE PARS PLANA VITRECTOMY WITH 20 GAUGE MVR PORT FOR MACULAR HOLE; Membrame peel, serum patch; Laser treatment ; Gas exchange;  Surgeon: Hayden Pedro, MD;  Location: [facility];  Service: Ophthalmology;  Laterality: Right;  . 25 GAUGE PARS PLANA VITRECTOMY WITH 20 GAUGE MVR PORT FOR MACULAR HOLE Left 09/01/2014   Procedure: 25 GAUGE PARS PLANA VITRECTOMY WITH 20  GAUGE MVR PORT FOR MACULAR HOLE;  Surgeon: Hayden Pedro, MD;  Location: [facility];  Service: Ophthalmology;  Laterality: Left;  . ANTERIOR CERVICAL DECOMP/DISCECTOMY FUSION N/A 12/20/2015   Procedure: C5-6 Anterior Cervical Discectomy and Fusion, Allograft, and Plate;  Surgeon: [name], MD;  Location: [facility];  Service: Orthopedics;  Laterality: N/A;  . APPENDECTOMY  2009  . bladder lesion removed    . CARDIAC CATHETERIZATION  01/11/2012  . CATARACT EXTRACTION W/ INTRAOCULAR LENS  IMPLANT, BILATERAL Bilateral 2011  . CHOLECYSTECTOMY  2009  . COLONOSCOPY    . COMPRESSION  HIP SCREW Left 04/04/2014   Procedure: COMPRESSION HIP LEFT;  Surgeon: Mcarthur Rossetti, MD;  Location: [facility];  Service: Orthopedics;  Laterality: Left;  . CORONARY ANGIOPLASTY    . CORONARY ARTERY BYPASS GRAFT  01/18/2012   Procedure: CORONARY ARTERY BYPASS GRAFTING (CABG);  Surgeon: Ivin Poot, MD;  Location: [facility];  Service: Open Heart Surgery;  Laterality: N/A;  times four, using left internal mammary artery  . CYSTOSCOPY WITH URETEROSCOPY  2008; 2009   "painted inside of bladder wall"  . ELBOW SURGERY Right 1988   "reconstructive OR"  . ENDOVEIN HARVEST OF GREATER SAPHENOUS VEIN  01/18/2012   Procedure: ENDOVEIN HARVEST OF GREATER SAPHENOUS VEIN;  Surgeon: Ivin Poot, MD;  Location: [facility];  Service: Open Heart Surgery;  Laterality: Right;  . ESOPHAGOGASTRODUODENOSCOPY    . ESOPHAGOGASTRODUODENOSCOPY N/A 06/05/2014   Procedure: ESOPHAGOGASTRODUODENOSCOPY (EGD);  Surgeon: Irene Shipper, MD;  Location: [facility];  Service: Endoscopy;  Laterality: N/A;  . EXCISION/RELEASE BURSA HIP Left 07/30/2015   Procedure: EXCISION/RELEASE BURSA HIP;  Surgeon: Mcarthur Rossetti, MD;  [facility];  Service: Orthopedics;  Laterality: Left;  . EXPLORATORY LAPAROTOMY  2009   with right colectomy  . FRACTURE SURGERY    . GAS INSERTION Left 09/01/2014   Procedure: INSERTION OF GAS;  Surgeon: Hayden Pedro, MD;   Location: [facility];  Service: Ophthalmology;  Laterality: Left;  C3F8  . GAS/FLUID EXCHANGE Left 09/01/2014   Procedure: GAS/FLUID EXCHANGE;  Surgeon: Hayden Pedro, MD;  Location: [facility];  Service: Ophthalmology;  Laterality: Left;  . HARDWARE REMOVAL Left 07/30/2015   Procedure: attempted LEFT HIP HARDWARE REMOVAL, LEFT HIP BURSECTOMY;  Surgeon: Mcarthur Rossetti, MD;  [facility];  Service: Orthopedics;  Laterality: Left;  . HEEL SPUR EXCISION Bilateral 1970's  . INTERTROCHANTERIC HIP FRACTURE SURGERY Left 04/04/2014  . JOINT REPLACEMENT    . LEFT HEART CATH AND CORS/GRAFTS ANGIOGRAPHY N/A 05/28/2017   Procedure: LEFT HEART CATH AND CORS/GRAFTS ANGIOGRAPHY;  Surgeon: Belva Crome, MD;  Location: [facility];  Service: Cardiovascular;  Laterality: N/A;  . LEFT HEART CATHETERIZATION WITH CORONARY ANGIOGRAM N/A 01/11/2012   Procedure: LEFT HEART CATHETERIZATION WITH CORONARY ANGIOGRAM;  Surgeon: Leonie Man, MD;  Location[facility];  Service: Cardiovascular;  Laterality: N/A;  . LUMBAR [place] SURGERY  ?2010   "cleaned out the stenosis" (01/11/2012)  . LUMBAR LAMINECTOMY/DECOMPRESSION MICRODISCECTOMY N/A 05/10/2018   Procedure: redo lumbar decompression L3-4;  Surgeon: [name], MD;  Location: [facility];  Service: Orthopedics;  Laterality: N/A;  . MEMBRANE PEEL Left 09/01/2014   Procedure: MEMBRANE PEEL;  Surgeon: Hayden Pedro, MD;  Location: [facility];  Service: Ophthalmology;  Laterality: Left;  . NM MYOCAR MULTIPLE W/SPECT  04/09/2012   NORMAL STRESS NUCLEAR STUDY. EF 51%.  [name] PARS PLANA VITRECTOMY W/ REPAIR OF MACULAR HOLE Left 09/01/2014  . PARTIAL COLECTOMY     d/t diverticulosis  . PHOTOCOAGULATION WITH LASER Left 09/01/2014   Procedure: PHOTOCOAGULATION WITH LASER;  Surgeon: Hayden Pedro, MD;  Location: [facility];  Service: Ophthalmology;  Laterality: Left;  HEADSCOPE LASER  . RECONSTRUCTION MEDIAL COLLATERAL LIGAMENT ELBOW W/ TENDON GRAFT Right 1988 X 2  . SERUM PATCH  Left 09/01/2014   Procedure: SERUM PATCH;  Surgeon: Hayden Pedro, MD;  Location: [facility];  Service: Ophthalmology;  Laterality: Left;  . TONSILLECTOMY AND ADENOIDECTOMY  ~1943  . TOTAL KNEE ARTHROPLASTY Bilateral ~ 2001; 2011   right;  left  . TRANSTHORACIC ECHOCARDIOGRAM  01/04/2012   MODERATE SEVERE-SEVERE LVH. EF=>55%. MILDLY IMPAIRED LV RELAXATION. LA IS MODERATE TO SEVERE DILATED. MODERATE MR. MV LEAFLETS APPEAR THICHENED.  [name] TUMOR EXCISION Left 12/2011   arm   Social History   Occupational History  . Occupation: Retired    Fish farm manager: RETIRED  Tobacco Use  . Smoking status: Never Smoker  . Smokeless tobacco: Never Used  Substance and Sexual Activity  . Alcohol use: No    Comment: 09/01/2014 "glass of wine a few times/yr; if that"  . Drug use: No  . Sexual activity: Yes

## 2018-05-21 ENCOUNTER — Telehealth (INDEPENDENT_AMBULATORY_CARE_PROVIDER_SITE_OTHER): Payer: Self-pay | Admitting: Orthopaedic Surgery

## 2018-05-21 ENCOUNTER — Other Ambulatory Visit (INDEPENDENT_AMBULATORY_CARE_PROVIDER_SITE_OTHER): Payer: Self-pay | Admitting: Orthopaedic Surgery

## 2018-05-21 MED ORDER — OXYCODONE-ACETAMINOPHEN 5-325 MG PO TABS: ORAL_TABLET | ORAL | 0 refills | Status: DC

## 2018-05-21 NOTE — Progress Notes (Signed)
Percocet renewed. He is on one po q 6 hrs and slowly weaning.

## 2018-05-21 NOTE — Telephone Encounter (Signed)
Please advise 

## 2018-05-21 NOTE — Telephone Encounter (Signed)
Patient called to request a refill of Oxycodone   [phone]

## 2018-05-21 NOTE — Telephone Encounter (Signed)
I sent in and I called.

## 2018-05-24 ENCOUNTER — Ambulatory Visit (INDEPENDENT_AMBULATORY_CARE_PROVIDER_SITE_OTHER): Payer: Medicare Other | Admitting: Orthopaedic Surgery

## 2018-05-24 ENCOUNTER — Encounter (INDEPENDENT_AMBULATORY_CARE_PROVIDER_SITE_OTHER): Payer: Self-pay | Admitting: Orthopaedic Surgery

## 2018-05-24 DIAGNOSIS — Z9889 Other specified postprocedural states: Secondary | ICD-10-CM

## 2018-05-24 NOTE — Progress Notes (Signed)
Post-Op Visit Note   Patient: Lucas Morales           Date of Birth: [birth year]           MRN: [id number] Visit Date: 05/24/2018 PCP: Deland Pretty, MD   Assessment & Plan: Post redo decompression L3-4.  Patient states he stopped his pain medication yesterday but has constipation.  He will check for impaction again apparent close he can get an enema if needed.  He is continuing his daily walking.  Incision looks good Steri-Strips applied staples harvested.  Return 4 weeks.  Chief Complaint:  Chief Complaint  Patient presents with  . Lower Back - Follow-up    05/10/2018 redo L3-4 decompression   Visit Diagnoses:  1. Status post lumbar spine surgery for decompression of spinal cord     Plan: Patient can shower.  He will check for impaction use an enema if needed.  Now that he stopped pain medication his constipation problem should rapidly improve.  Recheck 4 weeks continue walking program.  Follow-Up Instructions: Return in about 4 weeks (around 06/21/2018).   Orders:  No orders of the defined types were placed in this encounter.  No orders of the defined types were placed in this encounter.   Imaging: No results found.  PMFS History: Patient Active Problem List   Diagnosis Date Noted  . Status post lumbar spine surgery for decompression of spinal cord 05/24/2018  . Abnormal EKG 05/25/2017  . S/P cervical spinal fusion 01/25/2016  . Cervical spinal stenosis 12/20/2015  . Cervical spondylosis 12/20/2015  . Painful orthopaedic hardware left hip with chronic trochanteric bursitis 07/30/2015  . Hip bursitis 07/30/2015  . Trochanteric bursitis of left hip 07/30/2015  . Chest pain with moderate risk of acute coronary syndrome 10/19/2014  . Macular hole, left eye 09/01/2014  . Macular hole of left eye 08/28/2014  . Other pancytopenia ([place])   . Sliding hiatal hernia   . Gastric erosions   . GI bleeding 06/04/2014  . Acute GI bleeding 06/04/2014  . Orthostatic hypotension  06/04/2014  . Acute upper gastrointestinal bleeding 06/04/2014  . Other specified diabetes mellitus without complications ([place])   . Acute blood loss anemia   . Thrombocytopenia ([place])   . Closed left hip fracture ([place]) 04/03/2014  . Fracture, femur ([place]) 04/03/2014  . Femur fracture ([place]) 04/03/2014  . Upper airway cough syndrome 05/16/2013  . OSA (obstructive sleep apnea) 10/21/2012  . Macular hole 10/08/2012  . Preoperative clearance 09/30/2012  . PAF (paroxysmal atrial fibrillation) ([place]) 01/21/2012  . Shortness of breath on exertion 01/11/2012  . Bradycardia, sinus 01/11/2012  . Hx of CABG 01/11/2012  . Essential hypertension   . Dyslipidemia   . ABDOMINAL PAIN-LLQ 02/04/2010  . HEMORRHOIDS 08/31/2008  . ESOPHAGEAL STRICTURE 08/31/2008  . GERD 08/31/2008  . Gastritis and gastroduodenitis 08/31/2008  . HIATAL HERNIA 08/31/2008  . DIVERTICULOSIS, COLON 08/31/2008  . Chronic interstitial cystitis 08/31/2008  . COLONIC POLYPS, ADENOMATOUS, HX OF 08/31/2008   Past Medical History:  Diagnosis Date  . Adenomatous colon polyp   . Alpha-1-antitrypsin deficiency ([place])   . Alpha-1-antitrypsin deficiency carrier   . Anemia   . Anxiety   . Arthritis    "knees; bad in my back" (09/01/2014)  . Bruises easily   . Chronic bronchitis ([place])    "get it pretty much q yr" (09/01/2014)  . Chronic lower back pain   . Coronary artery disease   . Depression    "related to health" (09/01/2014)  .  Diarrhea    has had a part of colon removed  . Diverticulitis   . Diverticulosis   . Dyspnea on exertion   . Dysrhythmia    bradycardia in the 30's (01/11/2012).. Afib briefly after CABG  . Esophageal dysmotility   . Esophageal stricture   . Gastritis   . GERD (gastroesophageal reflux disease)    takes Pantoprazole daily  . Gout   . Hemorrhoids   . Hiatal hernia   . History of colon polyps   . Hyperlipidemia    doesn't require meds  . Hypertension    takes Losartan daily; current not  taking   . IC (interstitial cystitis)   . Insomnia    takes Nortrypyline nightly  . Joint pain   . Joint swelling   . Macular hole of both eyes   . Nocturia   . OSA (obstructive sleep apnea)    "mild; tried CPAP; couldn't tolerate" (09/01/2014)  . Paroxysmal atrial fibrillation ([facility])   . Restless leg syndrome   . Sciatic nerve pain   . Type II diabetes mellitus ([place])    "borderline"  . Urinary frequency     Family History  Problem Relation Age of Onset  . Emphysema Mother   . Kidney disease Father   . Emphysema Maternal Grandfather   . Colon cancer Neg Hx     Past Surgical History:  Procedure Laterality Date  . [place] VITRECTOMY WITH 20 GAUGE MVR PORT FOR MACULAR HOLE Right 10/08/2012   Procedure: 25 GAUGE PARS PLANA VITRECTOMY WITH 20 GAUGE MVR PORT FOR MACULAR HOLE; Membrame peel, serum patch; Laser treatment ; Gas exchange;  Surgeon: Hayden Pedro, MD;  Location: [facility];  Service: Ophthalmology;  Laterality: Right;  . 25 GAUGE PARS PLANA VITRECTOMY WITH 20 GAUGE MVR PORT FOR MACULAR HOLE Left 09/01/2014   Procedure: 25 GAUGE PARS PLANA VITRECTOMY WITH 20 GAUGE MVR PORT FOR MACULAR HOLE;  Surgeon: Hayden Pedro, MD;  Location: [facility];  Service: Ophthalmology;  Laterality: Left;  . ANTERIOR CERVICAL DECOMP/DISCECTOMY FUSION N/A 12/20/2015   Procedure: C5-6 Anterior Cervical Discectomy and Fusion, Allograft, and Plate;  Surgeon: [name], MD;  Location: [facility];  Service: Orthopedics;  Laterality: N/A;  . APPENDECTOMY  2009  . bladder lesion removed    . CARDIAC CATHETERIZATION  01/11/2012  . CATARACT EXTRACTION W/ INTRAOCULAR LENS  IMPLANT, BILATERAL Bilateral 2011  . CHOLECYSTECTOMY  2009  . COLONOSCOPY    . COMPRESSION HIP SCREW Left 04/04/2014   Procedure: COMPRESSION HIP LEFT;  Surgeon: Mcarthur Rossetti, MD;  Location: [facility];  Service: Orthopedics;  Laterality: Left;  . CORONARY ANGIOPLASTY    . CORONARY ARTERY BYPASS GRAFT  01/18/2012   Procedure:  CORONARY ARTERY BYPASS GRAFTING (CABG);  Surgeon: Ivin Poot, MD;  Location: [facility];  Service: Open Heart Surgery;  Laterality: N/A;  times four, using left internal mammary artery  . CYSTOSCOPY WITH URETEROSCOPY  2008; 2009   "painted inside of bladder wall"  . ELBOW SURGERY Right 1988   "reconstructive OR"  . ENDOVEIN HARVEST OF GREATER SAPHENOUS VEIN  01/18/2012   Procedure: ENDOVEIN HARVEST OF GREATER SAPHENOUS VEIN;  Surgeon: Ivin Poot, MD;  Location: [facility];  Service: Open Heart Surgery;  Laterality: Right;  . ESOPHAGOGASTRODUODENOSCOPY    . ESOPHAGOGASTRODUODENOSCOPY N/A 06/05/2014   Procedure: ESOPHAGOGASTRODUODENOSCOPY (EGD);  Surgeon: Irene Shipper, MD;  Location[facility];  Service: Endoscopy;  Laterality: N/A;  . EXCISION/RELEASE BURSA HIP  Left 07/30/2015   Procedure: EXCISION/RELEASE BURSA HIP;  Surgeon: Mcarthur Rossetti, MD;  [facility];  Service: Orthopedics;  Laterality: Left;  . EXPLORATORY LAPAROTOMY  2009   with right colectomy  . FRACTURE SURGERY    . GAS INSERTION Left 09/01/2014   Procedure: INSERTION OF GAS;  Surgeon: Hayden Pedro, MD;  Location: [facility];  Service: Ophthalmology;  Laterality: Left;  C3F8  . GAS/FLUID EXCHANGE Left 09/01/2014   Procedure: GAS/FLUID EXCHANGE;  Surgeon: Hayden Pedro, MD;  Location: [facility];  Service: Ophthalmology;  Laterality: Left;  . HARDWARE REMOVAL Left 07/30/2015   Procedure: attempted LEFT HIP HARDWARE REMOVAL, LEFT HIP BURSECTOMY;  Surgeon: Mcarthur Rossetti, MD;  [facility];  Service: Orthopedics;  Laterality: Left;  . HEEL SPUR EXCISION Bilateral 1970's  . INTERTROCHANTERIC HIP FRACTURE SURGERY Left 04/04/2014  . JOINT REPLACEMENT    . LEFT HEART CATH AND CORS/GRAFTS ANGIOGRAPHY N/A 05/28/2017   Procedure: LEFT HEART CATH AND CORS/GRAFTS ANGIOGRAPHY;  Surgeon: Belva Crome, MD;  Location: [facility];  Service: Cardiovascular;  Laterality: N/A;  . LEFT HEART CATHETERIZATION WITH CORONARY  ANGIOGRAM N/A 01/11/2012   Procedure: LEFT HEART CATHETERIZATION WITH CORONARY ANGIOGRAM;  Surgeon: Leonie Man, MD;  Location: [facility];  Service: Cardiovascular;  Laterality: N/A;  . LUMBAR [place] SURGERY  ?2010   "cleaned out the stenosis" (01/11/2012)  . LUMBAR LAMINECTOMY/DECOMPRESSION MICRODISCECTOMY N/A 05/10/2018   Procedure: redo lumbar decompression L3-4;  Surgeon: [name], MD;  Location: [facility];  Service: Orthopedics;  Laterality: N/A;  . MEMBRANE PEEL Left 09/01/2014   Procedure: MEMBRANE PEEL;  Surgeon: Hayden Pedro, MD;  Location: [facility];  Service: Ophthalmology;  Laterality: Left;  . NM MYOCAR MULTIPLE W/SPECT  04/09/2012   NORMAL STRESS NUCLEAR STUDY. EF 51%.  [name] PARS PLANA VITRECTOMY W/ REPAIR OF MACULAR HOLE Left 09/01/2014  . PARTIAL COLECTOMY     d/t diverticulosis  . PHOTOCOAGULATION WITH LASER Left 09/01/2014   Procedure: PHOTOCOAGULATION WITH LASER;  Surgeon: Hayden Pedro, MD;  Location: [place];  Service: Ophthalmology;  Laterality: Left;  HEADSCOPE LASER  . RECONSTRUCTION MEDIAL COLLATERAL LIGAMENT ELBOW W/ TENDON GRAFT Right 1988 X 2  . SERUM PATCH Left 09/01/2014   Procedure: SERUM PATCH;  Surgeon: Hayden Pedro, MD;  Location: [facility];  Service: Ophthalmology;  Laterality: Left;  . TONSILLECTOMY AND ADENOIDECTOMY  ~1943  . TOTAL KNEE ARTHROPLASTY Bilateral ~ 2001; 2011   right; left  . TRANSTHORACIC ECHOCARDIOGRAM  01/04/2012   MODERATE SEVERE-SEVERE LVH. EF=>55%. MILDLY IMPAIRED LV RELAXATION. LA IS MODERATE TO SEVERE DILATED. MODERATE MR. MV LEAFLETS APPEAR THICHENED.  [name] TUMOR EXCISION Left 12/2011   arm   Social History   Occupational History  . Occupation: Retired    Fish farm manager: RETIRED  Tobacco Use  . Smoking status: Never Smoker  . Smokeless tobacco: Never Used  Substance and Sexual Activity  . Alcohol use: No    Comment: 09/01/2014 "glass of wine a few times/yr; if that"  . Drug use: No  . Sexual activity: Yes

## 2018-05-29 ENCOUNTER — Telehealth (INDEPENDENT_AMBULATORY_CARE_PROVIDER_SITE_OTHER): Payer: Self-pay | Admitting: Orthopaedic Surgery

## 2018-05-29 ENCOUNTER — Other Ambulatory Visit (INDEPENDENT_AMBULATORY_CARE_PROVIDER_SITE_OTHER): Payer: Self-pay | Admitting: Orthopaedic Surgery

## 2018-05-29 MED ORDER — OXYCODONE-ACETAMINOPHEN 5-325 MG PO TABS: 1.0000 | ORAL_TABLET | ORAL | 0 refills | Status: DC | PRN

## 2018-05-29 NOTE — Telephone Encounter (Signed)
[facility]. I sent in # 20 tabs . Stretch it out , final refill [facility] thanks

## 2018-05-29 NOTE — Telephone Encounter (Signed)
Pt called in said he needs a new prescription for oxycodone 5-325 MG and have that sent to [facility] in [place] on [address]. [phone]

## 2018-05-29 NOTE — Telephone Encounter (Signed)
Patient aware, states he is still in a lot of pain his next appointment was not until April, I scheduled him for 3/17. Also he is requesting referral to pain management.

## 2018-05-29 NOTE — Progress Notes (Unsigned)
Percocet 5/325 one po q 4 hrs prn pain # 20

## 2018-05-29 NOTE — Telephone Encounter (Signed)
Please advise 

## 2018-05-30 NOTE — Telephone Encounter (Signed)
I discussed with him before surgery that he should avoid taking pain medication prior to surgery since he will have problems getting off the pain medication after surgery.  I discussed with him I do not recommend he go to a pain management clinic.  I have tried to call him several times but no answer when I called.

## 2018-06-04 ENCOUNTER — Ambulatory Visit (INDEPENDENT_AMBULATORY_CARE_PROVIDER_SITE_OTHER): Payer: Medicare Other | Admitting: Orthopaedic Surgery

## 2018-06-04 ENCOUNTER — Other Ambulatory Visit: Payer: Self-pay

## 2018-06-04 ENCOUNTER — Encounter (INDEPENDENT_AMBULATORY_CARE_PROVIDER_SITE_OTHER): Payer: Self-pay | Admitting: Orthopaedic Surgery

## 2018-06-04 VITALS — BP 152/82 | HR 79 | Ht 74.0 in | Wt 235.0 lb

## 2018-06-04 DIAGNOSIS — Z9889 Other specified postprocedural states: Secondary | ICD-10-CM

## 2018-06-04 NOTE — Progress Notes (Addendum)
Office Visit Note   Patient: Lucas Morales           Date of Birth: 08-05-1933           MRN: [id number] Visit Date: 06/04/2018              Requested by: Deland Pretty, MD [address] [place], [place] [address] PCP: Deland Pretty, MD   Assessment & Plan: Visit Diagnoses:  1. Status post lumbar spine surgery for decompression of spinal cord     Plan: Patient returns with ongoing severe back pain that radiates down to his coccyx.  He was able to take pain medication for about a month before surgery.  He is up  230 tablets for pain most recently oxycodone.  Area of severe stenosis was decompressed.  Originally was doing well after surgery but as he is tried to wean down on his pain medication his pain is gotten worse.  Will obtain an MRI scan to make sure there is no residual compression.  I discussed with him this is likely withdrawal from the narcotic medication.  He said he had problems with it in the past and had to go to a pain clinic to gradually wean off the pain medicine in the past.  We discussed stopping the pain medication can be just as effective and then after 10 to 12 days this should take care of the necrotic suppression of endorphins and and Keflex.  Office follow-up after MRI scan.  Initially his pain was better with improvement in his leg pain right after surgery.  He is ambulating without a cane.  He gets relief with supine position.  Follow-Up Instructions: No follow-ups on file.   Orders:  No orders of the defined types were placed in this encounter.  No orders of the defined types were placed in this encounter.     Procedures: No procedures performed   Clinical Data: No additional findings.   Subjective: Chief Complaint  Patient presents with  . Right Hip - Pain  . Lower Back - Pain    05/10/2018 Redo L3-4 Decompression    HPI postop decompression L3-4 with previous L2-3 decompression.  He is long considerable pain.  He called about  going to pain clinic.  I discussed with him as we had discussed before the surgery he was taking too much pain medicine before the surgery and getting off of it postop would be a problem .  Review of Systems Updated and unchanged.  Objective: Vital Signs: BP (!) 152/82   Pulse 79   Ht 6\' 2"  (1.88 m)   Wt 235 lb (106.6 kg)   BMI 30.17 kg/m   Physical Exam lumbar incision looks good.  Ortho Exam patient is a little bit better he lost some weight.  He has difficulty with sitting does better with standing or supine position.  Specialty Comments:  No specialty comments available.  Imaging: No results found.   PMFS History: Patient Active Problem List   Diagnosis Date Noted  . Status post lumbar spine surgery for decompression of spinal cord 05/24/2018  . Abnormal EKG 05/25/2017  . S/P cervical spinal fusion 01/25/2016  . Cervical spinal stenosis 12/20/2015  . Cervical spondylosis 12/20/2015  . Painful orthopaedic hardware left hip with chronic trochanteric bursitis 07/30/2015  . Hip bursitis 07/30/2015  . Trochanteric bursitis of left hip 07/30/2015  . Chest pain with moderate risk of acute coronary syndrome 10/19/2014  . Macular hole, left eye 09/01/2014  .  Macular hole of left eye 08/28/2014  . Other pancytopenia ([facility])   . Sliding hiatal hernia   . Gastric erosions   . GI bleeding 06/04/2014  . Acute GI bleeding 06/04/2014  . Orthostatic hypotension 06/04/2014  . Acute upper gastrointestinal bleeding 06/04/2014  . Other specified diabetes mellitus without complications ([facility])   . Acute blood loss anemia   . Thrombocytopenia ([facility])   . Closed left hip fracture ([facility]) 04/03/2014  . Fracture, femur ([facility]) 04/03/2014  . Femur fracture ([place] [facility]) 04/03/2014  . Upper airway cough syndrome 05/16/2013  . OSA (obstructive sleep apnea) 10/21/2012  . Macular hole 10/08/2012  . Preoperative clearance 09/30/2012  . PAF (paroxysmal atrial fibrillation) ([facility]) 01/21/2012  . Shortness of  breath on exertion 01/11/2012  . Bradycardia, sinus 01/11/2012  . Hx of CABG 01/11/2012  . Essential hypertension   . Dyslipidemia   . ABDOMINAL PAIN-LLQ 02/04/2010  . HEMORRHOIDS 08/31/2008  . ESOPHAGEAL STRICTURE 08/31/2008  . GERD 08/31/2008  . Gastritis and gastroduodenitis 08/31/2008  . HIATAL HERNIA 08/31/2008  . DIVERTICULOSIS, COLON 08/31/2008  . Chronic interstitial cystitis 08/31/2008  . COLONIC POLYPS, ADENOMATOUS, HX OF 08/31/2008   Past Medical History:  Diagnosis Date  . Adenomatous colon polyp   . Alpha-1-antitrypsin deficiency ([facility])   . Alpha-1-antitrypsin deficiency carrier   . Anemia   . Anxiety   . Arthritis    "knees; bad in my back" (09/01/2014)  . Bruises easily   . Chronic bronchitis ([facility])    "get it pretty much q yr" (09/01/2014)  . Chronic lower back pain   . Coronary artery disease   . Depression    "related to health" (09/01/2014)  . Diarrhea    has had a part of colon removed  . Diverticulitis   . Diverticulosis   . Dyspnea on exertion   . Dysrhythmia    bradycardia in the 30's (01/11/2012).. Afib briefly after CABG  . Esophageal dysmotility   . Esophageal stricture   . Gastritis   . GERD (gastroesophageal reflux disease)    takes Pantoprazole daily  . Gout   . Hemorrhoids   . Hiatal hernia   . History of colon polyps   . Hyperlipidemia    doesn't require meds  . Hypertension    takes Losartan daily; current not taking   . IC (interstitial cystitis)   . Insomnia    takes Nortrypyline nightly  . Joint pain   . Joint swelling   . Macular hole of both eyes   . Nocturia   . OSA (obstructive sleep apnea)    "mild; tried CPAP; couldn't tolerate" (09/01/2014)  . Paroxysmal atrial fibrillation ([facility])   . Restless leg syndrome   . Sciatic nerve pain   . Type II diabetes mellitus ([facility])    "borderline"  . Urinary frequency     Family History  Problem Relation Age of Onset  . Emphysema Mother   . Kidney disease Father   . Emphysema  Maternal Grandfather   . Colon cancer Neg Hx     Past Surgical History:  Procedure Laterality Date  . [place] VITRECTOMY WITH 20 GAUGE MVR PORT FOR MACULAR HOLE Right 10/08/2012   Procedure: 25 GAUGE PARS PLANA VITRECTOMY WITH 20 GAUGE MVR PORT FOR MACULAR HOLE; Membrame peel, serum patch; Laser treatment ; Gas exchange;  Surgeon: Hayden Pedro, MD;  Location: [facility];  Service: Ophthalmology;  Laterality: Right;  . 25 GAUGE PARS PLANA VITRECTOMY WITH 20 GAUGE MVR PORT FOR  MACULAR HOLE Left 09/01/2014   Procedure: 47 GAUGE PARS PLANA VITRECTOMY WITH 20 GAUGE MVR PORT FOR MACULAR HOLE;  Surgeon: Hayden Pedro, MD;  Location: [facility];  Service: Ophthalmology;  Laterality: Left;  . ANTERIOR CERVICAL DECOMP/DISCECTOMY FUSION N/A 12/20/2015   Procedure: C5-6 Anterior Cervical Discectomy and Fusion, Allograft, and Plate;  Surgeon: [name], MD;  Location: [facility];  Service: Orthopedics;  Laterality: N/A;  . APPENDECTOMY  2009  . bladder lesion removed    . CARDIAC CATHETERIZATION  01/11/2012  . CATARACT EXTRACTION W/ INTRAOCULAR LENS  IMPLANT, BILATERAL Bilateral 2011  . CHOLECYSTECTOMY  2009  . COLONOSCOPY    . COMPRESSION HIP SCREW Left 04/04/2014   Procedure: COMPRESSION HIP LEFT;  Surgeon: Mcarthur Rossetti, MD;  Location: [facility];  Service: Orthopedics;  Laterality: Left;  . CORONARY ANGIOPLASTY    . CORONARY ARTERY BYPASS GRAFT  01/18/2012   Procedure: CORONARY ARTERY BYPASS GRAFTING (CABG);  Surgeon: Ivin Poot, MD;  Location: [facility];  Service: Open Heart Surgery;  Laterality: N/A;  times four, using left internal mammary artery  . CYSTOSCOPY WITH URETEROSCOPY  2008; 2009   "painted inside of bladder wall"  . ELBOW SURGERY Right 1988   "reconstructive OR"  . ENDOVEIN HARVEST OF GREATER SAPHENOUS VEIN  01/18/2012   Procedure: ENDOVEIN HARVEST OF GREATER SAPHENOUS VEIN;  Surgeon: Ivin Poot, MD;  Location: [facility];  Service: Open Heart Surgery;  Laterality: Right;  .  ESOPHAGOGASTRODUODENOSCOPY    . ESOPHAGOGASTRODUODENOSCOPY N/A 06/05/2014   Procedure: ESOPHAGOGASTRODUODENOSCOPY (EGD);  Surgeon: Irene Shipper, MD;  Location: [facility];  Service: Endoscopy;  Laterality: N/A;  . EXCISION/RELEASE BURSA HIP Left 07/30/2015   Procedure: EXCISION/RELEASE BURSA HIP;  Surgeon: Mcarthur Rossetti, MD;  Location: [facility];  Service: Orthopedics;  Laterality: Left;  . EXPLORATORY LAPAROTOMY  2009   with right colectomy  . FRACTURE SURGERY    . GAS INSERTION Left 09/01/2014   Procedure: INSERTION OF GAS;  Surgeon: Hayden Pedro, MD;  Location: [facility];  Service: Ophthalmology;  Laterality: Left;  C3F8  . GAS/FLUID EXCHANGE Left 09/01/2014   Procedure: GAS/FLUID EXCHANGE;  Surgeon: Hayden Pedro, MD;  Location: [facility];  Service: Ophthalmology;  Laterality: Left;  . HARDWARE REMOVAL Left 07/30/2015   Procedure: attempted LEFT HIP HARDWARE REMOVAL, LEFT HIP BURSECTOMY;  Surgeon: Mcarthur Rossetti, MD;  Location: [facility];  Service: Orthopedics;  Laterality: Left;  . HEEL SPUR EXCISION Bilateral 1970's  . INTERTROCHANTERIC HIP FRACTURE SURGERY Left 04/04/2014  . JOINT REPLACEMENT    . LEFT HEART CATH AND CORS/GRAFTS ANGIOGRAPHY N/A 05/28/2017   Procedure: LEFT HEART CATH AND CORS/GRAFTS ANGIOGRAPHY;  Surgeon: Belva Crome, MD;  Location: [facility];  Service: Cardiovascular;  Laterality: N/A;  . LEFT HEART CATHETERIZATION WITH CORONARY ANGIOGRAM N/A 01/11/2012   Procedure: LEFT HEART CATHETERIZATION WITH CORONARY ANGIOGRAM;  Surgeon: Leonie Man, MD;  Location: [facility];  Service: Cardiovascular;  Laterality: N/A;  . LUMBAR [place] SURGERY  ?2010   "cleaned out the stenosis" (01/11/2012)  . LUMBAR LAMINECTOMY/DECOMPRESSION MICRODISCECTOMY N/A 05/10/2018   Procedure: redo lumbar decompression L3-4;  Surgeon: [name], MD;  Location: [facility];  Service: Orthopedics;  Laterality: N/A;  . MEMBRANE PEEL Left 09/01/2014   Procedure: MEMBRANE PEEL;  Surgeon:  Hayden Pedro, MD;  Location: [facility];  Service: Ophthalmology;  Laterality: Left;  . NM MYOCAR MULTIPLE W/SPECT  04/09/2012   NORMAL STRESS NUCLEAR STUDY. EF  51%.  [name] PARS PLANA VITRECTOMY W/ REPAIR OF MACULAR HOLE Left 09/01/2014  . PARTIAL COLECTOMY     d/t diverticulosis  . PHOTOCOAGULATION WITH LASER Left 09/01/2014   Procedure: PHOTOCOAGULATION WITH LASER;  Surgeon: Hayden Pedro, MD;  Location: [facility];  Service: Ophthalmology;  Laterality: Left;  HEADSCOPE LASER  . RECONSTRUCTION MEDIAL COLLATERAL LIGAMENT ELBOW W/ TENDON GRAFT Right 1988 X 2  . SERUM PATCH Left 09/01/2014   Procedure: SERUM PATCH;  Surgeon: Hayden Pedro, MD;  Location: [facility];  Service: Ophthalmology;  Laterality: Left;  . TONSILLECTOMY AND ADENOIDECTOMY  ~1943  . TOTAL KNEE ARTHROPLASTY Bilateral ~ 2001; 2011   right; left  . TRANSTHORACIC ECHOCARDIOGRAM  01/04/2012   MODERATE SEVERE-SEVERE LVH. EF=>55%. MILDLY IMPAIRED LV RELAXATION. LA IS MODERATE TO SEVERE DILATED. MODERATE MR. MV LEAFLETS APPEAR THICHENED.  [name] TUMOR EXCISION Left 12/2011   arm   Social History   Occupational History  . Occupation: Retired    Fish farm manager: RETIRED  Tobacco Use  . Smoking status: Never Smoker  . Smokeless tobacco: Never Used  Substance and Sexual Activity  . Alcohol use: No    Comment: 09/01/2014 "glass of wine a few times/yr; if that"  . Drug use: No  . Sexual activity: Yes

## 2018-06-04 NOTE — Addendum Note (Signed)
Addended by: Meyer Cory on: 06/04/2018 04:50 PM   Modules accepted: Orders

## 2018-06-10 ENCOUNTER — Telehealth (INDEPENDENT_AMBULATORY_CARE_PROVIDER_SITE_OTHER): Payer: Self-pay | Admitting: Orthopaedic Surgery

## 2018-06-10 ENCOUNTER — Other Ambulatory Visit (INDEPENDENT_AMBULATORY_CARE_PROVIDER_SITE_OTHER): Payer: Self-pay | Admitting: Orthopaedic Surgery

## 2018-06-10 MED ORDER — PREDNISONE 5 MG (21) PO TBPK: ORAL_TABLET | ORAL | 0 refills | Status: DC

## 2018-06-10 NOTE — Telephone Encounter (Signed)
I called discussed. One week out of stopping. Got another week to go . I sent in prednisone pack he will pick up. FYI

## 2018-06-10 NOTE — Telephone Encounter (Signed)
New Message  Pt verbalized he has been off of opioids and the pain is severe and can barely walk.

## 2018-06-10 NOTE — Telephone Encounter (Signed)
noted 

## 2018-06-10 NOTE — Telephone Encounter (Signed)
Please advise 

## 2018-06-12 ENCOUNTER — Telehealth (INDEPENDENT_AMBULATORY_CARE_PROVIDER_SITE_OTHER): Payer: Self-pay | Admitting: Radiology

## 2018-06-12 NOTE — Telephone Encounter (Signed)
Moved patients appointment time up on 06/18/2018.  Patient asked if he could take the prednisone taper and naproxen at the same time. So far prednisone taper has not provided any relief. Rx was given on 06/10/2018  Call back # [phone]

## 2018-06-12 NOTE — Telephone Encounter (Signed)
FYI, I called , having some withdrawal symptoms, he can use tylenol with prednisone but best not to use naproxen with it . I called discussed. He is day 8 without narcotics. FYI

## 2018-06-17 ENCOUNTER — Telehealth (INDEPENDENT_AMBULATORY_CARE_PROVIDER_SITE_OTHER): Payer: Self-pay | Admitting: *Deleted

## 2018-06-17 ENCOUNTER — Telehealth (INDEPENDENT_AMBULATORY_CARE_PROVIDER_SITE_OTHER): Payer: Self-pay | Admitting: Orthopaedic Surgery

## 2018-06-17 NOTE — Telephone Encounter (Signed)
Patient called advised after 2 weeks being off opioids he can barely move.  Patient said he don't think he can make his appointment tomorrow. Patient asked if Dr Lorin Mercy can Skype or talk to him by phone? The number to contact patient is [phone] or [phone]

## 2018-06-17 NOTE — Telephone Encounter (Signed)
Please advise. Patient was scheduled for MRI review tomorrow. It appears that his MRI was not performed prior to change in schedules for COVID-19.

## 2018-06-17 NOTE — Telephone Encounter (Signed)
I called pt to get prescreened for COVID 19 for his appt tomorrow and pt states he does not think he will be able to make his appt tomorrow since he has not had the MRI done yet. I asked if he wanted me to cancel his appt and he wanted to wait until Dr. Lorin Mercy calls hiim to see what he can do before cancelling.

## 2018-06-18 ENCOUNTER — Other Ambulatory Visit (INDEPENDENT_AMBULATORY_CARE_PROVIDER_SITE_OTHER): Payer: Self-pay | Admitting: Orthopaedic Surgery

## 2018-06-18 ENCOUNTER — Ambulatory Visit (INDEPENDENT_AMBULATORY_CARE_PROVIDER_SITE_OTHER): Payer: Medicare Other | Admitting: Orthopaedic Surgery

## 2018-06-18 ENCOUNTER — Telehealth (INDEPENDENT_AMBULATORY_CARE_PROVIDER_SITE_OTHER): Payer: Self-pay | Admitting: Orthopaedic Surgery

## 2018-06-18 MED ORDER — OXYCODONE-ACETAMINOPHEN 5-325 MG PO TABS: 1.0000 | ORAL_TABLET | ORAL | 0 refills | Status: DC | PRN

## 2018-06-18 NOTE — Telephone Encounter (Signed)
sw Miriam with [facility] and she is going to contact pt to schedule appt

## 2018-06-18 NOTE — Addendum Note (Signed)
Addended by: Daylene Posey T on: 06/18/2018 02:10 PM   Modules accepted: Orders

## 2018-06-18 NOTE — Telephone Encounter (Signed)
Please advise 

## 2018-06-18 NOTE — Telephone Encounter (Signed)
done

## 2018-06-18 NOTE — Telephone Encounter (Signed)
Pt states been off narcotics times 2 wks.   Last 20 tabs 3/11 .    Sent in # 30 more , he states legs weaker problems walking  Could not make it to office.proceed with MRI post op weakness increased post decompression lumbar.

## 2018-06-18 NOTE — Telephone Encounter (Signed)
Dr. Lorin Mercy would like to change the MRI Lumbar Spine to urgent. Patient is post op and is experiencing increasing pain and leg weakness. Could you please change the status on the MRI and submit?  Patient is aware he is changing status to get MRI done sooner.  Thanks.

## 2018-06-18 NOTE — Telephone Encounter (Signed)
Patient called needing Rx refilled (Oxycodone) Patient said he can not come to his appointment today because of the pain he is in   The number to contact patient is [phone]

## 2018-06-20 ENCOUNTER — Ambulatory Visit
Admission: RE | Admit: 2018-06-20 | Discharge: 2018-06-20 | Disposition: A | Discharge status: Home / Self Care | Payer: Medicare Other | Source: Ambulatory Visit | Attending: Orthopaedic Surgery | Admitting: Orthopaedic Surgery

## 2018-06-20 ENCOUNTER — Other Ambulatory Visit: Payer: Self-pay

## 2018-06-20 DIAGNOSIS — Z9889 Other specified postprocedural states: Secondary | ICD-10-CM

## 2018-06-21 ENCOUNTER — Inpatient Hospital Stay (HOSPITAL_COMMUNITY): Payer: Medicare Other

## 2018-06-21 ENCOUNTER — Ambulatory Visit (HOSPITAL_COMMUNITY)
Admission: RE | Admit: 2018-06-21 | Discharge: 2018-06-21 | Disposition: A | Discharge status: Home / Self Care | Payer: Medicare Other | Source: Ambulatory Visit | Attending: Surgery | Admitting: Surgery

## 2018-06-21 ENCOUNTER — Other Ambulatory Visit: Payer: Self-pay | Admitting: Radiology

## 2018-06-21 ENCOUNTER — Other Ambulatory Visit (INDEPENDENT_AMBULATORY_CARE_PROVIDER_SITE_OTHER): Payer: Self-pay | Admitting: Radiology

## 2018-06-21 ENCOUNTER — Other Ambulatory Visit (INDEPENDENT_AMBULATORY_CARE_PROVIDER_SITE_OTHER): Payer: Self-pay | Admitting: Surgery

## 2018-06-21 ENCOUNTER — Encounter (HOSPITAL_COMMUNITY): Payer: Self-pay | Admitting: *Deleted

## 2018-06-21 ENCOUNTER — Inpatient Hospital Stay (HOSPITAL_COMMUNITY)
Admission: AD | Admit: 2018-06-21 | Discharge: 2018-07-19 | DRG: 478 | Disposition: A | Discharge status: Home / Self Care | Payer: Medicare Other | Source: Ambulatory Visit | Attending: Family Medicine | Admitting: Family Medicine

## 2018-06-21 ENCOUNTER — Encounter (HOSPITAL_COMMUNITY): Payer: Self-pay

## 2018-06-21 ENCOUNTER — Ambulatory Visit (INDEPENDENT_AMBULATORY_CARE_PROVIDER_SITE_OTHER): Payer: Medicare Other | Admitting: Orthopaedic Surgery

## 2018-06-21 DIAGNOSIS — R1909 Other intra-abdominal and pelvic swelling, mass and lump: Secondary | ICD-10-CM | POA: Diagnosis not present

## 2018-06-21 DIAGNOSIS — I10 Essential (primary) hypertension: Secondary | ICD-10-CM | POA: Diagnosis present

## 2018-06-21 DIAGNOSIS — Z01818 Encounter for other preprocedural examination: Secondary | ICD-10-CM

## 2018-06-21 DIAGNOSIS — I82493 Acute embolism and thrombosis of other specified deep vein of lower extremity, bilateral: Secondary | ICD-10-CM

## 2018-06-21 DIAGNOSIS — Z9861 Coronary angioplasty status: Secondary | ICD-10-CM

## 2018-06-21 DIAGNOSIS — D696 Thrombocytopenia, unspecified: Secondary | ICD-10-CM | POA: Diagnosis present

## 2018-06-21 DIAGNOSIS — Z6833 Body mass index (BMI) 33.0-33.9, adult: Secondary | ICD-10-CM | POA: Diagnosis not present

## 2018-06-21 DIAGNOSIS — N3289 Other specified disorders of bladder: Secondary | ICD-10-CM | POA: Diagnosis not present

## 2018-06-21 DIAGNOSIS — G893 Neoplasm related pain (acute) (chronic): Secondary | ICD-10-CM | POA: Diagnosis present

## 2018-06-21 DIAGNOSIS — M869 Osteomyelitis, unspecified: Secondary | ICD-10-CM | POA: Diagnosis present

## 2018-06-21 DIAGNOSIS — I480 Paroxysmal atrial fibrillation: Secondary | ICD-10-CM | POA: Diagnosis present

## 2018-06-21 DIAGNOSIS — E872 Acidosis: Secondary | ICD-10-CM | POA: Diagnosis present

## 2018-06-21 DIAGNOSIS — Z951 Presence of aortocoronary bypass graft: Secondary | ICD-10-CM

## 2018-06-21 DIAGNOSIS — N179 Acute kidney failure, unspecified: Secondary | ICD-10-CM | POA: Diagnosis present

## 2018-06-21 DIAGNOSIS — M4626 Osteomyelitis of vertebra, lumbar region: Secondary | ICD-10-CM

## 2018-06-21 DIAGNOSIS — D539 Nutritional anemia, unspecified: Secondary | ICD-10-CM | POA: Diagnosis present

## 2018-06-21 DIAGNOSIS — E8770 Fluid overload, unspecified: Secondary | ICD-10-CM | POA: Diagnosis not present

## 2018-06-21 DIAGNOSIS — M84550A Pathological fracture in neoplastic disease, pelvis, initial encounter for fracture: Secondary | ICD-10-CM | POA: Diagnosis present

## 2018-06-21 DIAGNOSIS — M48061 Spinal stenosis, lumbar region without neurogenic claudication: Secondary | ICD-10-CM | POA: Diagnosis not present

## 2018-06-21 DIAGNOSIS — I82461 Acute embolism and thrombosis of right calf muscular vein: Secondary | ICD-10-CM | POA: Diagnosis present

## 2018-06-21 DIAGNOSIS — Z885 Allergy status to narcotic agent status: Secondary | ICD-10-CM | POA: Diagnosis not present

## 2018-06-21 DIAGNOSIS — K5900 Constipation, unspecified: Secondary | ICD-10-CM | POA: Diagnosis not present

## 2018-06-21 DIAGNOSIS — R1032 Left lower quadrant pain: Secondary | ICD-10-CM

## 2018-06-21 DIAGNOSIS — Z981 Arthrodesis status: Secondary | ICD-10-CM

## 2018-06-21 DIAGNOSIS — E875 Hyperkalemia: Secondary | ICD-10-CM | POA: Diagnosis not present

## 2018-06-21 DIAGNOSIS — C414 Malignant neoplasm of pelvic bones, sacrum and coccyx: Principal | ICD-10-CM | POA: Diagnosis present

## 2018-06-21 DIAGNOSIS — N4889 Other specified disorders of penis: Secondary | ICD-10-CM | POA: Diagnosis present

## 2018-06-21 DIAGNOSIS — J42 Unspecified chronic bronchitis: Secondary | ICD-10-CM | POA: Diagnosis present

## 2018-06-21 DIAGNOSIS — E876 Hypokalemia: Secondary | ICD-10-CM | POA: Diagnosis not present

## 2018-06-21 DIAGNOSIS — C787 Secondary malignant neoplasm of liver and intrahepatic bile duct: Secondary | ICD-10-CM | POA: Diagnosis present

## 2018-06-21 DIAGNOSIS — Z7982 Long term (current) use of aspirin: Secondary | ICD-10-CM

## 2018-06-21 DIAGNOSIS — S329XXA Fracture of unspecified parts of lumbosacral spine and pelvis, initial encounter for closed fracture: Secondary | ICD-10-CM

## 2018-06-21 DIAGNOSIS — Z96653 Presence of artificial knee joint, bilateral: Secondary | ICD-10-CM | POA: Diagnosis present

## 2018-06-21 DIAGNOSIS — E871 Hypo-osmolality and hyponatremia: Secondary | ICD-10-CM | POA: Diagnosis present

## 2018-06-21 DIAGNOSIS — R339 Retention of urine, unspecified: Secondary | ICD-10-CM | POA: Diagnosis not present

## 2018-06-21 DIAGNOSIS — K2970 Gastritis, unspecified, without bleeding: Secondary | ICD-10-CM

## 2018-06-21 DIAGNOSIS — C499 Malignant neoplasm of connective and soft tissue, unspecified: Secondary | ICD-10-CM | POA: Diagnosis not present

## 2018-06-21 DIAGNOSIS — C7802 Secondary malignant neoplasm of left lung: Secondary | ICD-10-CM | POA: Diagnosis present

## 2018-06-21 DIAGNOSIS — I82409 Acute embolism and thrombosis of unspecified deep veins of unspecified lower extremity: Secondary | ICD-10-CM | POA: Diagnosis not present

## 2018-06-21 DIAGNOSIS — I82401 Acute embolism and thrombosis of unspecified deep veins of right lower extremity: Secondary | ICD-10-CM | POA: Diagnosis not present

## 2018-06-21 DIAGNOSIS — C7801 Secondary malignant neoplasm of right lung: Secondary | ICD-10-CM | POA: Diagnosis present

## 2018-06-21 DIAGNOSIS — I2510 Atherosclerotic heart disease of native coronary artery without angina pectoris: Secondary | ICD-10-CM | POA: Diagnosis present

## 2018-06-21 DIAGNOSIS — E785 Hyperlipidemia, unspecified: Secondary | ICD-10-CM | POA: Diagnosis present

## 2018-06-21 DIAGNOSIS — M8618 Other acute osteomyelitis, other site: Secondary | ICD-10-CM

## 2018-06-21 DIAGNOSIS — C801 Malignant (primary) neoplasm, unspecified: Secondary | ICD-10-CM | POA: Diagnosis not present

## 2018-06-21 DIAGNOSIS — K2990 Gastroduodenitis, unspecified, without bleeding: Secondary | ICD-10-CM | POA: Diagnosis present

## 2018-06-21 DIAGNOSIS — C799 Secondary malignant neoplasm of unspecified site: Secondary | ICD-10-CM | POA: Diagnosis present

## 2018-06-21 DIAGNOSIS — D6859 Other primary thrombophilia: Secondary | ICD-10-CM | POA: Diagnosis present

## 2018-06-21 DIAGNOSIS — R918 Other nonspecific abnormal finding of lung field: Secondary | ICD-10-CM | POA: Diagnosis not present

## 2018-06-21 DIAGNOSIS — E669 Obesity, unspecified: Secondary | ICD-10-CM | POA: Diagnosis present

## 2018-06-21 DIAGNOSIS — Z9889 Other specified postprocedural states: Secondary | ICD-10-CM | POA: Diagnosis not present

## 2018-06-21 DIAGNOSIS — Z66 Do not resuscitate: Secondary | ICD-10-CM | POA: Diagnosis present

## 2018-06-21 DIAGNOSIS — R1900 Intra-abdominal and pelvic swelling, mass and lump, unspecified site: Secondary | ICD-10-CM | POA: Diagnosis not present

## 2018-06-21 DIAGNOSIS — Z515 Encounter for palliative care: Secondary | ICD-10-CM | POA: Diagnosis not present

## 2018-06-21 DIAGNOSIS — M4620 Osteomyelitis of vertebra, site unspecified: Secondary | ICD-10-CM

## 2018-06-21 DIAGNOSIS — E119 Type 2 diabetes mellitus without complications: Secondary | ICD-10-CM

## 2018-06-21 DIAGNOSIS — G4733 Obstructive sleep apnea (adult) (pediatric): Secondary | ICD-10-CM | POA: Diagnosis present

## 2018-06-21 DIAGNOSIS — R601 Generalized edema: Secondary | ICD-10-CM | POA: Diagnosis not present

## 2018-06-21 DIAGNOSIS — Z7189 Other specified counseling: Secondary | ICD-10-CM | POA: Diagnosis not present

## 2018-06-21 DIAGNOSIS — D490 Neoplasm of unspecified behavior of digestive system: Secondary | ICD-10-CM | POA: Diagnosis not present

## 2018-06-21 HISTORY — PX: IR US GUIDE BX ASP/DRAIN: IMG2392

## 2018-06-21 LAB — CBC WITH DIFFERENTIAL/PLATELET
Abs Immature Granulocytes: 0.02 10*3/uL (ref 0.00–0.07)
Basophils Absolute: 0 10*3/uL (ref 0.0–0.1)
Basophils Relative: 0 %
Eosinophils Absolute: 0 10*3/uL (ref 0.0–0.5)
Eosinophils Relative: 1 %
HCT: 34.2 % — ABNORMAL LOW (ref 39.0–52.0)
Hemoglobin: 11.6 g/dL — ABNORMAL LOW (ref 13.0–17.0)
Immature Granulocytes: 0 %
Lymphocytes Relative: 14 %
Lymphs Abs: 1 10*3/uL (ref 0.7–4.0)
MCH: 33 pg (ref 26.0–34.0)
MCHC: 33.9 g/dL (ref 30.0–36.0)
MCV: 97.4 fL (ref 80.0–100.0)
Monocytes Absolute: 0.4 10*3/uL (ref 0.1–1.0)
Monocytes Relative: 6 %
Neutro Abs: 5.7 10*3/uL (ref 1.7–7.7)
Neutrophils Relative %: 79 %
Platelets: 226 10*3/uL (ref 150–400)
RBC: 3.51 MIL/uL — ABNORMAL LOW (ref 4.22–5.81)
RDW: 15.2 % (ref 11.5–15.5)
WBC: 7.2 10*3/uL (ref 4.0–10.5)
nRBC: 0 % (ref 0.0–0.2)

## 2018-06-21 LAB — C-REACTIVE PROTEIN: CRP: 2.8 mg/dL — ABNORMAL HIGH (ref <1.0)

## 2018-06-21 LAB — COMPREHENSIVE METABOLIC PANEL
ALT: 43 U/L (ref 0–44)
AST: 43 U/L — ABNORMAL HIGH (ref 15–41)
Albumin: 3.2 g/dL — ABNORMAL LOW (ref 3.5–5.0)
Alkaline Phosphatase: 412 U/L — ABNORMAL HIGH (ref 38–126)
Anion gap: 6 (ref 5–15)
BUN: 19 mg/dL (ref 8–23)
CO2: 23 mmol/L (ref 22–32)
Calcium: 9.2 mg/dL (ref 8.9–10.3)
Chloride: 111 mmol/L (ref 98–111)
Creatinine, Ser: 0.96 mg/dL (ref 0.61–1.24)
GFR calc Af Amer: >60 mL/min (ref >60)
GFR calc non Af Amer: >60 mL/min (ref >60)
Glucose, Bld: 119 mg/dL — ABNORMAL HIGH (ref 70–99)
Potassium: 4.3 mmol/L (ref 3.5–5.1)
Sodium: 140 mmol/L (ref 135–145)
Total Bilirubin: 0.9 mg/dL (ref 0.3–1.2)
Total Protein: 5.9 g/dL — ABNORMAL LOW (ref 6.5–8.1)

## 2018-06-21 LAB — URINALYSIS, ROUTINE W REFLEX MICROSCOPIC
Bilirubin Urine: NEGATIVE
Glucose, UA: NEGATIVE mg/dL
Ketones, ur: NEGATIVE mg/dL
Leukocytes,Ua: NEGATIVE
Nitrite: NEGATIVE
Protein, ur: NEGATIVE mg/dL
Specific Gravity, Urine: 1.018 (ref 1.005–1.030)
pH: 5 (ref 5.0–8.0)

## 2018-06-21 LAB — PROTIME-INR
INR: 1.1 (ref 0.8–1.2)
Prothrombin Time: 13.8 seconds (ref 11.4–15.2)

## 2018-06-21 LAB — CBC
HCT: 35.3 % — ABNORMAL LOW (ref 39.0–52.0)
Hemoglobin: 11.5 g/dL — ABNORMAL LOW (ref 13.0–17.0)
MCH: 31.9 pg (ref 26.0–34.0)
MCHC: 32.6 g/dL (ref 30.0–36.0)
MCV: 97.8 fL (ref 80.0–100.0)
Platelets: 241 10*3/uL (ref 150–400)
RBC: 3.61 MIL/uL — ABNORMAL LOW (ref 4.22–5.81)
RDW: 15.1 % (ref 11.5–15.5)
WBC: 6.6 10*3/uL (ref 4.0–10.5)
nRBC: 0 % (ref 0.0–0.2)

## 2018-06-21 LAB — GLUCOSE, CAPILLARY
Glucose-Capillary: 105 mg/dL — ABNORMAL HIGH (ref 70–99)
Glucose-Capillary: 168 mg/dL — ABNORMAL HIGH (ref 70–99)

## 2018-06-21 LAB — SEDIMENTATION RATE: Sed Rate: 65 mm/hr — ABNORMAL HIGH (ref 0–16)

## 2018-06-21 MED ORDER — FENTANYL CITRATE (PF) 100 MCG/2ML IJ SOLN
50.0000 ug | Freq: Once | INTRAMUSCULAR | Status: AC
  Administered 2018-06-21: 14:00:00 50 ug via INTRAVENOUS

## 2018-06-21 MED ORDER — OXYCODONE HCL 5 MG PO TABS
5.0000 mg | ORAL_TABLET | ORAL | Status: DC | PRN
  Administered 2018-06-21: 5 mg via ORAL
  Filled 2018-06-21 (×2): qty 1

## 2018-06-21 MED ORDER — FLEET ENEMA 7-19 GM/118ML RE ENEM: 1.0000 | ENEMA | Freq: Once | RECTAL | Status: DC | PRN

## 2018-06-21 MED ORDER — PIPERACILLIN-TAZOBACTAM 3.375 G IVPB
3.3750 g | Freq: Three times a day (TID) | INTRAVENOUS | Status: DC
  Administered 2018-06-22 (×2): 3.375 g via INTRAVENOUS
  Filled 2018-06-21 (×2): qty 50

## 2018-06-21 MED ORDER — HYDROMORPHONE HCL 1 MG/ML IJ SOLN
INTRAMUSCULAR | Status: DC | PRN
  Administered 2018-06-21: 1 mg via INTRAVENOUS

## 2018-06-21 MED ORDER — FENTANYL CITRATE (PF) 100 MCG/2ML IJ SOLN
INTRAMUSCULAR | Status: DC | PRN
  Administered 2018-06-21: 50 ug via INTRAVENOUS

## 2018-06-21 MED ORDER — ONDANSETRON HCL 4 MG PO TABS: 4.0000 mg | ORAL_TABLET | Freq: Four times a day (QID) | ORAL | Status: DC | PRN

## 2018-06-21 MED ORDER — OXYCODONE HCL 5 MG PO TABS
10.0000 mg | ORAL_TABLET | ORAL | Status: DC | PRN
  Administered 2018-06-21 – 2018-06-25 (×19): 10 mg via ORAL
  Filled 2018-06-21 (×19): qty 2

## 2018-06-21 MED ORDER — HYDROMORPHONE HCL 1 MG/ML IJ SOLN
INTRAMUSCULAR | Status: AC
  Filled 2018-06-21: qty 1

## 2018-06-21 MED ORDER — POLYETHYLENE GLYCOL 3350 17 G PO PACK: 17.0000 g | PACK | Freq: Every day | ORAL | Status: DC | PRN

## 2018-06-21 MED ORDER — SODIUM CHLORIDE 0.45 % IV SOLN
INTRAVENOUS | Status: DC
  Administered 2018-06-21 – 2018-06-27 (×10): via INTRAVENOUS

## 2018-06-21 MED ORDER — SODIUM CHLORIDE 0.9% FLUSH
10.0000 mL | Freq: Two times a day (BID) | INTRAVENOUS | Status: DC
  Administered 2018-06-23 – 2018-07-17 (×17): 10 mL
  Administered 2018-07-18: 21:00:00 20 mL
  Administered 2018-07-18 – 2018-07-19 (×2): 10 mL

## 2018-06-21 MED ORDER — LIDOCAINE HCL (PF) 1 % IJ SOLN
INTRAMUSCULAR | Status: DC | PRN
  Administered 2018-06-21: 5 mL

## 2018-06-21 MED ORDER — SODIUM CHLORIDE 0.9% FLUSH
10.0000 mL | INTRAVENOUS | Status: DC | PRN
  Administered 2018-06-28 – 2018-07-09 (×6): 10 mL
  Filled 2018-06-21 (×6): qty 40

## 2018-06-21 MED ORDER — FENTANYL CITRATE (PF) 100 MCG/2ML IJ SOLN
INTRAMUSCULAR | Status: AC
  Filled 2018-06-21: qty 2

## 2018-06-21 MED ORDER — PIPERACILLIN-TAZOBACTAM 3.375 G IVPB 30 MIN
3.3750 g | Freq: Once | INTRAVENOUS | Status: AC
  Administered 2018-06-21: 3.375 g via INTRAVENOUS
  Filled 2018-06-21: qty 50

## 2018-06-21 MED ORDER — ONDANSETRON HCL 4 MG/2ML IJ SOLN
4.0000 mg | Freq: Four times a day (QID) | INTRAMUSCULAR | Status: DC | PRN
  Administered 2018-07-15: 10:00:00 4 mg via INTRAVENOUS
  Filled 2018-06-21 (×2): qty 2

## 2018-06-21 MED ORDER — VANCOMYCIN HCL 10 G IV SOLR
2000.0000 mg | Freq: Once | INTRAVENOUS | Status: AC
  Administered 2018-06-21: 2000 mg via INTRAVENOUS
  Filled 2018-06-21: qty 2000

## 2018-06-21 MED ORDER — INSULIN ASPART 100 UNIT/ML ~~LOC~~ SOLN
0.0000 [IU] | Freq: Three times a day (TID) | SUBCUTANEOUS | Status: DC
  Administered 2018-06-22: 2 [IU] via SUBCUTANEOUS
  Administered 2018-06-24 – 2018-06-25 (×2): 3 [IU] via SUBCUTANEOUS
  Administered 2018-06-26 (×2): 2 [IU] via SUBCUTANEOUS
  Administered 2018-06-27 (×2): 3 [IU] via SUBCUTANEOUS
  Administered 2018-06-27: 08:00:00 2 [IU] via SUBCUTANEOUS
  Administered 2018-06-28: 5 [IU] via SUBCUTANEOUS
  Administered 2018-06-28: 09:00:00 3 [IU] via SUBCUTANEOUS
  Administered 2018-06-28: 5 [IU] via SUBCUTANEOUS
  Administered 2018-06-29: 3 [IU] via SUBCUTANEOUS
  Administered 2018-06-29: 5 [IU] via SUBCUTANEOUS
  Administered 2018-06-29 – 2018-06-30 (×3): 3 [IU] via SUBCUTANEOUS
  Administered 2018-06-30 – 2018-07-01 (×2): 2 [IU] via SUBCUTANEOUS
  Administered 2018-07-01: 3 [IU] via SUBCUTANEOUS
  Administered 2018-07-01 – 2018-07-03 (×5): 2 [IU] via SUBCUTANEOUS
  Administered 2018-07-04: 19:00:00 3 [IU] via SUBCUTANEOUS
  Administered 2018-07-04 – 2018-07-06 (×3): 2 [IU] via SUBCUTANEOUS
  Administered 2018-07-07: 3 [IU] via SUBCUTANEOUS
  Administered 2018-07-08: 16:00:00 5 [IU] via SUBCUTANEOUS
  Administered 2018-07-09: 17:00:00 3 [IU] via SUBCUTANEOUS
  Administered 2018-07-09: 2 [IU] via SUBCUTANEOUS
  Administered 2018-07-10: 17:00:00 3 [IU] via SUBCUTANEOUS
  Administered 2018-07-11: 1 [IU] via SUBCUTANEOUS
  Administered 2018-07-13 – 2018-07-14 (×2): 5 [IU] via SUBCUTANEOUS
  Administered 2018-07-15 – 2018-07-16 (×2): 3 [IU] via SUBCUTANEOUS
  Administered 2018-07-16 – 2018-07-17 (×2): 1 [IU] via SUBCUTANEOUS
  Administered 2018-07-17 – 2018-07-18 (×2): 3 [IU] via SUBCUTANEOUS
  Administered 2018-07-18: 2 [IU] via SUBCUTANEOUS

## 2018-06-21 MED ORDER — LIDOCAINE HCL 1 % IJ SOLN
INTRAMUSCULAR | Status: AC
  Filled 2018-06-21: qty 20

## 2018-06-21 MED ORDER — DOCUSATE SODIUM 100 MG PO CAPS
100.0000 mg | ORAL_CAPSULE | Freq: Two times a day (BID) | ORAL | Status: DC
  Administered 2018-06-21 – 2018-07-03 (×20): 100 mg via ORAL
  Filled 2018-06-21 (×20): qty 1

## 2018-06-21 MED ORDER — VANCOMYCIN HCL 1000 MG IV SOLR
1000.0000 mg | Freq: Two times a day (BID) | INTRAVENOUS | Status: DC
  Administered 2018-06-22 – 2018-06-23 (×4): 1000 mg via INTRAVENOUS
  Filled 2018-06-21 (×6): qty 1000

## 2018-06-21 MED ORDER — SODIUM CHLORIDE 0.9 % IV SOLN
INTRAVENOUS | Status: DC | PRN
  Administered 2018-06-21: 10 mL/h via INTRAVENOUS

## 2018-06-21 NOTE — H&P (Addendum)
Lucas Morales is an 85 y.o. male.   Chief Complaint:  HPI: 85 year old male lives alone is admitted for suspected postoperative lumbar decompression at L3-4 infection.  Surgery date was 04/2122 severe stenosis at L3-4, multifactorial by MRI on 03/31/2018 and neurogenic claudication symptoms.  Patient underwent surgery postoperatively had no problems with fever or wound drainage.  He had persistent problems with pain and had been taking pain medication for several weeks prior to his surgical decompression.  Operative findings showed severe stenosis and postoperatively had improvement in his leg symptoms.  The last 3 weeks patient's had problems with weight loss poor p.o. intake.  Postop MRI scan was obtained that showed fluid collection multiloculated and area and L3 vertebral body suggestive of osteomyelitis.  MRI scan 03/31/2018 showed no abnormality of the L3 vertebral body.  Patient had recent pain in his penis without dysuria or urinary frequency.  Additionally patient has history of coronary artery disease, diabetes type 2, depression.  History of chronic interstitial cystitis.  Previous left hip fracture dyslipidemia, post cervical fusion.  Patient seen 06/04/2018 with back pain that radiated to his coccyx.  His pain is increased as his narcotic pain medication had been decreased.  He had past history of difficulty getting off narcotic medication in the distant past.  MRI scan was ordered obtained and PICC line is been placed earlier today for IV antibiotics and interventional radiology performed aspiration with report showing serous bloody fluid.  Past Medical History:  Diagnosis Date  . Adenomatous colon polyp   . Alpha-1-antitrypsin deficiency ([place])   . Alpha-1-antitrypsin deficiency carrier   . Anemia   . Anxiety   . Arthritis    "knees; bad in my back" (09/01/2014)  . Bruises easily   . Chronic bronchitis ([place])    "get it pretty much q yr" (09/01/2014)  . Chronic lower back pain   .  Coronary artery disease   . Depression    "related to health" (09/01/2014)  . Diarrhea    has had a part of colon removed  . Diverticulitis   . Diverticulosis   . Dyspnea on exertion   . Dysrhythmia    bradycardia in the 30's (01/11/2012).. Afib briefly after CABG  . Esophageal dysmotility   . Esophageal stricture   . Gastritis   . GERD (gastroesophageal reflux disease)    takes Pantoprazole daily  . Gout   . Hemorrhoids   . Hiatal hernia   . History of colon polyps   . Hyperlipidemia    doesn't require meds  . Hypertension    takes Losartan daily; current not taking   . IC (interstitial cystitis)   . Insomnia    takes Nortrypyline nightly  . Joint pain   . Joint swelling   . Macular hole of both eyes   . Nocturia   . OSA (obstructive sleep apnea)    "mild; tried CPAP; couldn't tolerate" (09/01/2014)  . Paroxysmal atrial fibrillation ([facility])   . Restless leg syndrome   . Sciatic nerve pain   . Type II diabetes mellitus ([place])    "borderline"  . Urinary frequency     Past Surgical History:  Procedure Laterality Date  . Gay VITRECTOMY WITH 20 GAUGE MVR PORT FOR MACULAR HOLE Right 10/08/2012   Procedure: 25 GAUGE PARS PLANA VITRECTOMY WITH 20 GAUGE MVR PORT FOR MACULAR HOLE; Membrame peel, serum patch; Laser treatment ; Gas exchange;  Surgeon: Hayden Pedro, MD;  Location: [facility];  Service: Ophthalmology;  Laterality: Right;  . 25 GAUGE PARS PLANA VITRECTOMY WITH 20 GAUGE MVR PORT FOR MACULAR HOLE Left 09/01/2014   Procedure: 25 GAUGE PARS PLANA VITRECTOMY WITH 20 GAUGE MVR PORT FOR MACULAR HOLE;  Surgeon: Hayden Pedro, MD;  Location: [facility];  Service: Ophthalmology;  Laterality: Left;  . ANTERIOR CERVICAL DECOMP/DISCECTOMY FUSION N/A 12/20/2015   Procedure: C5-6 Anterior Cervical Discectomy and Fusion, Allograft, and Plate;  Surgeon: [name], MD;  Location: [facility];  Service: Orthopedics;  Laterality: N/A;  . APPENDECTOMY  2009  . bladder lesion removed     . CARDIAC CATHETERIZATION  01/11/2012  . CATARACT EXTRACTION W/ INTRAOCULAR LENS  IMPLANT, BILATERAL Bilateral 2011  . CHOLECYSTECTOMY  2009  . COLONOSCOPY    . COMPRESSION HIP SCREW Left 04/04/2014   Procedure: COMPRESSION HIP LEFT;  Surgeon: Mcarthur Rossetti, MD;  Location: [facility];  Service: Orthopedics;  Laterality: Left;  . CORONARY ANGIOPLASTY    . CORONARY ARTERY BYPASS GRAFT  01/18/2012   Procedure: CORONARY ARTERY BYPASS GRAFTING (CABG);  Surgeon: Ivin Poot, MD;  Location: [facility];  Service: Open Heart Surgery;  Laterality: N/A;  times four, using left internal mammary artery  . CYSTOSCOPY WITH URETEROSCOPY  2008; 2009   "painted inside of bladder wall"  . ELBOW SURGERY Right 1988   "reconstructive OR"  . ENDOVEIN HARVEST OF GREATER SAPHENOUS VEIN  01/18/2012   Procedure: ENDOVEIN HARVEST OF GREATER SAPHENOUS VEIN;  Surgeon: Ivin Poot, MD;  Location: [facility];  Service: Open Heart Surgery;  Laterality: Right;  . ESOPHAGOGASTRODUODENOSCOPY    . ESOPHAGOGASTRODUODENOSCOPY N/A 06/05/2014   Procedure: ESOPHAGOGASTRODUODENOSCOPY (EGD);  Surgeon: Irene Shipper, MD;  Location: [facility];  Service: Endoscopy;  Laterality: N/A;  . EXCISION/RELEASE BURSA HIP Left 07/30/2015   Procedure: EXCISION/RELEASE BURSA HIP;  Surgeon: Mcarthur Rossetti, MD;  Location: [facility];  Service: Orthopedics;  Laterality: Left;  . EXPLORATORY LAPAROTOMY  2009   with right colectomy  . FRACTURE SURGERY    . GAS INSERTION Left 09/01/2014   Procedure: INSERTION OF GAS;  Surgeon: Hayden Pedro, MD;  Location: [facility];  Service: Ophthalmology;  Laterality: Left;  C3F8  . GAS/FLUID EXCHANGE Left 09/01/2014   Procedure: GAS/FLUID EXCHANGE;  Surgeon: Hayden Pedro, MD;  Location: [facility];  Service: Ophthalmology;  Laterality: Left;  . HARDWARE REMOVAL Left 07/30/2015   Procedure: attempted LEFT HIP HARDWARE REMOVAL, LEFT HIP BURSECTOMY;  Surgeon: Mcarthur Rossetti, MD;  Location: [facility];  Service:  Orthopedics;  Laterality: Left;  . HEEL SPUR EXCISION Bilateral 1970's  . INTERTROCHANTERIC HIP FRACTURE SURGERY Left 04/04/2014  . IR US GUIDE BX ASP/DRAIN  06/21/2018  . JOINT REPLACEMENT    . LEFT HEART CATH AND CORS/GRAFTS ANGIOGRAPHY N/A 05/28/2017   Procedure: LEFT HEART CATH AND CORS/GRAFTS ANGIOGRAPHY;  Surgeon: Belva Crome, MD;  Location: [facility];  Service: Cardiovascular;  Laterality: N/A;  . LEFT HEART CATHETERIZATION WITH CORONARY ANGIOGRAM N/A 01/11/2012   Procedure: LEFT HEART CATHETERIZATION WITH CORONARY ANGIOGRAM;  Surgeon: Leonie Man, MD;  Location: [facility];  Service: Cardiovascular;  Laterality: N/A;  . LUMBAR [place] SURGERY  ?2010   "cleaned out the stenosis" (01/11/2012)  . LUMBAR LAMINECTOMY/DECOMPRESSION MICRODISCECTOMY N/A 05/10/2018   Procedure: redo lumbar decompression L3-4;  Surgeon: [name], MD;  Location: [facility];  Service: Orthopedics;  Laterality: N/A;  . MEMBRANE PEEL Left 09/01/2014   Procedure: MEMBRANE PEEL;  Surgeon: Hayden Pedro, MD;  Location: [facility];  Service: Ophthalmology;  Laterality: Left;  . NM MYOCAR MULTIPLE W/SPECT  04/09/2012   NORMAL STRESS NUCLEAR STUDY. EF 51%.  [name] PARS PLANA VITRECTOMY W/ REPAIR OF MACULAR HOLE Left 09/01/2014  . PARTIAL COLECTOMY     d/t diverticulosis  . PHOTOCOAGULATION WITH LASER Left 09/01/2014   Procedure: PHOTOCOAGULATION WITH LASER;  Surgeon: Hayden Pedro, MD;  Location: [facility];  Service: Ophthalmology;  Laterality: Left;  HEADSCOPE LASER  . RECONSTRUCTION MEDIAL COLLATERAL LIGAMENT ELBOW W/ TENDON GRAFT Right 1988 X 2  . SERUM PATCH Left 09/01/2014   Procedure: SERUM PATCH;  Surgeon: Hayden Pedro, MD;  Location: [facility];  Service: Ophthalmology;  Laterality: Left;  . TONSILLECTOMY AND ADENOIDECTOMY  ~1943  . TOTAL KNEE ARTHROPLASTY Bilateral ~ 2001; 2011   right; left  . TRANSTHORACIC ECHOCARDIOGRAM  01/04/2012   MODERATE SEVERE-SEVERE LVH. EF=>55%. MILDLY IMPAIRED LV RELAXATION. LA IS  MODERATE TO SEVERE DILATED. MODERATE MR. MV LEAFLETS APPEAR THICHENED.  [name] TUMOR EXCISION Left 12/2011   arm    Family History  Problem Relation Age of Onset  . Emphysema Mother   . Kidney disease Father   . Emphysema Maternal Grandfather   . Colon cancer Neg Hx    Social History:  reports that he has never smoked. He has never used smokeless tobacco. He reports current alcohol use. He reports that he does not use drugs.  Allergies:  Allergies  Allergen Reactions  . Morphine Itching    Medications Prior to Admission  Medication Sig Dispense Refill  . allopurinol (ZYLOPRIM) 300 MG tablet Take 300 mg by mouth daily.  2  . aspirin EC 81 MG tablet Take 81 mg by mouth daily.    [name] atorvastatin (LIPITOR) 40 MG tablet     . metoprolol succinate (TOPROL-XL) 50 MG 24 hr tablet Take 1 tablet (50 mg total) by mouth daily. 90 tablet 3  . oxyCODONE-acetaminophen (PERCOCET/ROXICET) 5-325 MG tablet Take 1 tablet by mouth every 4 (four) hours as needed for severe pain. 30 tablet 0  . pantoprazole (PROTONIX) 40 MG tablet Take 1 tablet (40 mg total) by mouth 2 (two) times daily. 60 tablet 3  . predniSONE (STERAPRED UNI-PAK 21 TAB) 5 MG (21) TBPK tablet Take as instruction , take with food, 6 tablets the first day, then 5,4,3,2,1. Then stop 21 tablet 0  . tolnaftate (TINACTIN) 1 % cream Apply 1 application topically 3 (three) times daily as needed.    . tolnaftate (TINACTIN) 1 % spray Apply topically 3 (three) times daily.      Results for orders placed or performed during the hospital encounter of 06/21/18 (from the past 48 hour(s))  Comprehensive metabolic panel     Status: Abnormal   Collection Time: 06/21/18  4:42 PM  Result Value Ref Range   Sodium 140 135 - 145 mmol/L   Potassium 4.3 3.5 - 5.1 mmol/L   Chloride 111 98 - 111 mmol/L   CO2 23 22 - 32 mmol/L   Glucose, Bld 119 (H) 70 - 99 mg/dL   BUN 19 8 - 23 mg/dL   Creatinine, Ser 0.96 0.61 - 1.24 mg/dL   Calcium 9.2 8.9 - 10.3 mg/dL    Total Protein 5.9 (L) 6.5 - 8.1 g/dL   Albumin 3.2 (L) 3.5 - 5.0 g/dL   AST 43 (H) 15 - 41 U/L   ALT 43 0 - 44 U/L   Alkaline Phosphatase 412 (H) 38 - 126 U/L   Total Bilirubin  0.9 0.3 - 1.2 mg/dL   GFR calc non Af Amer >60 >60 mL/min   GFR calc Af Amer >60 >60 mL/min   Anion gap 6 5 - 15    Comment: Performed at [facility] [address], [place], [place] [address]  CBC WITH DIFFERENTIAL     Status: Abnormal   Collection Time: 06/21/18  4:42 PM  Result Value Ref Range   WBC 7.2 4.0 - 10.5 K/uL   RBC 3.51 (L) 4.22 - 5.81 MIL/uL   Hemoglobin 11.6 (L) 13.0 - 17.0 g/dL   HCT 34.2 (L) 39.0 - 52.0 %   MCV 97.4 80.0 - 100.0 fL   MCH 33.0 26.0 - 34.0 pg   MCHC 33.9 30.0 - 36.0 g/dL   RDW 15.2 11.5 - 15.5 %   Platelets 226 150 - 400 K/uL   nRBC 0.0 0.0 - 0.2 %   Neutrophils Relative % 79 %   Neutro Abs 5.7 1.7 - 7.7 K/uL   Lymphocytes Relative 14 %   Lymphs Abs 1.0 0.7 - 4.0 K/uL   Monocytes Relative 6 %   Monocytes Absolute 0.4 0.1 - 1.0 K/uL   Eosinophils Relative 1 %   Eosinophils Absolute 0.0 0.0 - 0.5 K/uL   Basophils Relative 0 %   Basophils Absolute 0.0 0.0 - 0.1 K/uL   Immature Granulocytes 0 %   Abs Immature Granulocytes 0.02 0.00 - 0.07 K/uL    Comment: Performed at [facility] [address], [place], [place] [address]  C-reactive protein     Status: Abnormal   Collection Time: 06/21/18  4:42 PM  Result Value Ref Range   CRP 2.8 (H) <1.0 mg/dL    Comment: Performed at [facility] [address] [place], [place] [address]  Urinalysis, Routine w reflex microscopic     Status: Abnormal   Collection Time: 06/21/18  5:00 PM  Result Value Ref Range   Color, Urine YELLOW YELLOW   APPearance CLEAR CLEAR   Specific Gravity, Urine 1.018 1.005 - 1.030   pH 5.0 5.0 - 8.0   Glucose, UA NEGATIVE NEGATIVE mg/dL   Hgb urine dipstick MODERATE (A) NEGATIVE   Bilirubin Urine NEGATIVE NEGATIVE   Ketones, ur NEGATIVE NEGATIVE mg/dL   Protein, ur NEGATIVE  NEGATIVE mg/dL   Nitrite NEGATIVE NEGATIVE   Leukocytes,Ua NEGATIVE NEGATIVE   RBC / HPF 11-20 0 - 5 RBC/hpf   WBC, UA 0-5 0 - 5 WBC/hpf   Bacteria, UA RARE (A) NONE SEEN   Mucus PRESENT    Hyaline Casts, UA PRESENT     Comment: Performed at [facility] [address], [place], [place] [address]  Glucose, capillary     Status: Abnormal   Collection Time: 06/21/18  5:32 PM  Result Value Ref Range   Glucose-Capillary 105 (H) 70 - 99 mg/dL   Mr Lumbar Spine W/o Contrast  Addendum Date: 06/21/2018   ADDENDUM REPORT: 06/21/2018 13:25 ADDENDUM: The new abnormal signal in the posterior L3 vertebral body could also represent a small infarct. Correlation with clinical symptoms and inflammatory markers is still recommended to exclude osteomyelitis. Electronically Signed   By: Titus Dubin M.D.   On: 06/21/2018 13:25   Result Date: 06/21/2018 CLINICAL DATA:  Low back pain radiating to the right buttock and leg to the knee for the past 3 months. Bilateral leg numbness and weakness. Recent surgery in February. EXAM: MRI LUMBAR SPINE WITHOUT CONTRAST TECHNIQUE: Multiplanar, multisequence  MR imaging of the lumbar spine was performed. No intravenous contrast was administered. COMPARISON:  MRI lumbar spine dated March 31, 2018. FINDINGS: Segmentation:  Unchanged partial sacralization of L5 on the left. Alignment:  Unchanged 4 mm anterolisthesis at L4-L5. Vertebrae: No fracture. New focal, rounded 2.3 cm area of slightly T2 hypointense, T1 hypointense, slightly stir hyperintense marrow signal in the posterior L3 vertebral body with a curvilinear rim of T2/STIR hyperintensity. Conus medullaris and cauda equina: Conus extends to the T12-L1 level. Conus and cauda equina appear normal. Paraspinal and other soft tissues: Paraspinous postsurgical changes centered at L3-L4 with loculated 2.8 x 1.4 x 7.9 cm fluid collection. No epidural fluid collection. Disc levels: T12-L1: Unchanged mild disc bulging and mild  bilateral facet arthropathy. Unchanged mild right neuroforaminal stenosis. No spinal canal or left neuroforaminal stenosis. L1-L2: Unchanged small shallow broad-based posterior disc protrusion and mild bilateral facet arthropathy with ligamentum flavum hypertrophy resulting and moderate spinal canal and mild bilateral neuroforaminal stenosis. L2-L3: Prior posterior decompression. Unchanged small shallow broad-based posterior disc protrusion and mild bilateral facet arthropathy. Unchanged moderate bilateral lateral recess and neuroforaminal stenosis. No spinal canal stenosis. L3-L4: Prior posterior decompression with interval bilateral partial facetectomy. Unchanged small broad-based posterior disc protrusion and severe bilateral facet arthropathy. Resolved spinal canal stenosis. Residual mild bilateral lateral recess stenosis and moderate bilateral neuroforaminal stenosis. L4-L5: Unchanged mild disc uncovering and disc bulging with prior bilateral facet joint ankylosis. Unchanged mild bilateral lateral recess and neuroforaminal stenosis. No spinal canal stenosis. L5-S1: Negative disc. Unchanged moderate right and mild left facet arthropathy. No stenosis. IMPRESSION: 1. New focal, rounded 2.3 cm area of abnormal signal in the posterior L3 vertebral body, most concerning for osteomyelitis given recent surgery. The appearance could be seen with a metastatic lesion such as prostate cancer, although this is thought to be less likely given the normal marrow signal 3 months ago. 2. Interval repeat posterior decompression with new bilateral partial facetectomies at L3-L4. Resolved spinal canal stenosis. There is residual mild bilateral lateral recess stenosis and moderate bilateral neuroforaminal stenosis. 3. Unchanged moderate spinal canal stenosis at L1-L2. 4. Prior remote posterior decompression at L2-L3 with unchanged moderate bilateral lateral recess and neuroforaminal stenosis. 5. Paraspinous postsurgical changes  centered at L3-L4 with loculated 2.8 x 1.4 x 7.9 cm fluid collection, likely a seroma. These results will be called to the ordering clinician or representative by the Radiologist Assistant, and communication documented in the PACS or zVision Dashboard. Electronically Signed: By: Titus Dubin M.D. On: 06/21/2018 10:10   Ir US Guide Bx Asp/drain  Result Date: 06/21/2018 INDICATION: Indeterminate fluid collection within the midline of the low back. Please perform ultrasound-guided aspiration for diagnostic purposes. Poor venous access. Please perform PICC line placement for long-term antibiotic administration. EXAM: 1. ULTRASOUND AND FLUOROSCOPIC GUIDED PICC LINE INSERTION 2. ULTRASOUND GUIDED ASPIRATION ILL-DEFINED FLUID COLLECTION WITHIN MIDLINE OF LOW BACK COMPARISON:  Lumbar spine MRI - 06/20/2018 MEDICATIONS: None. Anesthesia/sedation: Moderate (conscious) sedation was employed during this procedure. A total of Fentanyl 50 mcg and Dilaudid 1 mg was administered intravenously. Moderate Sedation Time: 33 minutes. The patient's level of consciousness and vital signs were monitored continuously by radiology nursing throughout the procedure under my direct supervision. CONTRAST:  None FLUOROSCOPY TIME:  36 seconds (2.3 mGy) COMPLICATIONS: None immediate. TECHNIQUE: The procedure, risks, benefits, and alternatives were explained to the patient and informed written consent was obtained. A timeout was performed prior to the initiation of the procedure. The right upper extremity was prepped with chlorhexidine  in a sterile fashion, and a sterile drape was applied covering the operative field. Maximum barrier sterile technique with sterile gowns and gloves were used for the procedure. A timeout was performed prior to the initiation of the procedure. Local anesthesia was provided with 1% lidocaine. Under direct ultrasound guidance, the basilic vein was accessed with a micropuncture kit after the overlying soft tissues  were anesthetized with 1% lidocaine. After the overlying soft tissues were anesthetized, a small venotomy incision was created and a micropuncture kit was utilized to access the right basilic vein. Real-time ultrasound guidance was utilized for vascular access including the acquisition of a permanent ultrasound image documenting patency of the accessed vessel. A guidewire was advanced to the level of the superior caval-atrial junction for measurement purposes and the PICC line was cut to length. A peel-away sheath was placed and a 40 cm, 5 [place], dual lumen was inserted to level of the superior caval-atrial junction. A post procedure spot fluoroscopic was obtained. The catheter easily aspirated and flushed and was sutured in place. A dressing was placed. _________________________________________________________ Attention was now paid towards aspiration of the ill-defined fluid collection within the midline of the low back demonstrated on preceding lumbar spine MRI. Sonographic evaluation of the soft tissues subjacent to the well-healed surgical incision within the midline of the low back demonstrated an ill-defined fluid collection correlating with ill-defined fluid collection demonstrated on preceding lumbar spine MRI As such, the overlying soft tissues were prepped and draped in usual sterile fashion. After the overlying soft tissues were anesthetized with 1% lidocaine, an 18 gauge trocar needle was advanced into the dominant ill-defined component of the collection. An ultrasound image was saved for procedural documentation purposes Next, approximately 2 cc of serous, slightly blood tinged fluid was aspirated as the needle was slowly retracted. All aspirated fluid was capped and sent to the laboratory for analysis. A dressing was placed. The patient tolerated the above procedures well without immediate postprocedural complication. FINDINGS: After catheter placement, the tip lies within the superior cavoatrial  junction. The catheter aspirates and flushes normally and is ready for immediate use. Technically successful ultrasound-guided aspiration of 2 cc of serous, slightly blood tinged fluid from the ill-defined serpiginous fluid collection within the midline of the low back, subjacent to the well-healed midline surgical incision, correlating with the ill-defined fluid collection demonstrated on preceding lumbar spine MRI. IMPRESSION: 1. Successful ultrasound and fluoroscopic guided placement of a right basilic vein approach, 40 cm, 5 [place], dual lumen PICC with tip at the superior caval-atrial junction. The PICC line is ready for immediate use. 2. Successful ultrasound-guided aspiration of approximately 2 cc of serous, slightly blood tinged fluid from the ill-defined fluid collection within the midline of the low back. All aspirated fluid was capped and sent to the laboratory for analysis. Note, the fluid collection is currently too small to allow for percutaneous drainage catheter placement. Electronically Signed   By: Sandi Mariscal M.D.   On: 06/21/2018 15:30   Portable Chest 1 View  Result Date: 06/21/2018 CLINICAL DATA:  Preoperative study.  Recent PICC line placement. EXAM: PORTABLE CHEST 1 VIEW COMPARISON:  Chest x-ray dated May 03, 2018. FINDINGS: Interval placement of a right upper extremity PICC line with the tip at the cavoatrial junction. The heart size and mediastinal contours are within normal limits. Prior CABG. Atherosclerotic calcification of the aortic arch. Normal pulmonary vascularity. No focal consolidation, pleural effusion, or pneumothorax. Unchanged calcified granuloma in the right upper lobe. No acute osseous abnormality.  Unchanged moderate to large hiatal hernia. IMPRESSION: No active disease. Electronically Signed   By: Titus Dubin M.D.   On: 06/21/2018 16:58   Ir Picc Placement Right >5 Yrs Inc Img Guide  Result Date: 06/21/2018 INDICATION: Indeterminate fluid collection within  the midline of the low back. Please perform ultrasound-guided aspiration for diagnostic purposes. Poor venous access. Please perform PICC line placement for long-term antibiotic administration. EXAM: 1. ULTRASOUND AND FLUOROSCOPIC GUIDED PICC LINE INSERTION 2. ULTRASOUND GUIDED ASPIRATION ILL-DEFINED FLUID COLLECTION WITHIN MIDLINE OF LOW BACK COMPARISON:  Lumbar spine MRI - 06/20/2018 MEDICATIONS: None. Anesthesia/sedation: Moderate (conscious) sedation was employed during this procedure. A total of Fentanyl 50 mcg and Dilaudid 1 mg was administered intravenously. Moderate Sedation Time: 33 minutes. The patient's level of consciousness and vital signs were monitored continuously by radiology nursing throughout the procedure under my direct supervision. CONTRAST:  None FLUOROSCOPY TIME:  36 seconds (2.3 mGy) COMPLICATIONS: None immediate. TECHNIQUE: The procedure, risks, benefits, and alternatives were explained to the patient and informed written consent was obtained. A timeout was performed prior to the initiation of the procedure. The right upper extremity was prepped with chlorhexidine in a sterile fashion, and a sterile drape was applied covering the operative field. Maximum barrier sterile technique with sterile gowns and gloves were used for the procedure. A timeout was performed prior to the initiation of the procedure. Local anesthesia was provided with 1% lidocaine. Under direct ultrasound guidance, the basilic vein was accessed with a micropuncture kit after the overlying soft tissues were anesthetized with 1% lidocaine. After the overlying soft tissues were anesthetized, a small venotomy incision was created and a micropuncture kit was utilized to access the right basilic vein. Real-time ultrasound guidance was utilized for vascular access including the acquisition of a permanent ultrasound image documenting patency of the accessed vessel. A guidewire was advanced to the level of the superior  caval-atrial junction for measurement purposes and the PICC line was cut to length. A peel-away sheath was placed and a 40 cm, 5 [place], dual lumen was inserted to level of the superior caval-atrial junction. A post procedure spot fluoroscopic was obtained. The catheter easily aspirated and flushed and was sutured in place. A dressing was placed. _________________________________________________________ Attention was now paid towards aspiration of the ill-defined fluid collection within the midline of the low back demonstrated on preceding lumbar spine MRI. Sonographic evaluation of the soft tissues subjacent to the well-healed surgical incision within the midline of the low back demonstrated an ill-defined fluid collection correlating with ill-defined fluid collection demonstrated on preceding lumbar spine MRI As such, the overlying soft tissues were prepped and draped in usual sterile fashion. After the overlying soft tissues were anesthetized with 1% lidocaine, an 18 gauge trocar needle was advanced into the dominant ill-defined component of the collection. An ultrasound image was saved for procedural documentation purposes Next, approximately 2 cc of serous, slightly blood tinged fluid was aspirated as the needle was slowly retracted. All aspirated fluid was capped and sent to the laboratory for analysis. A dressing was placed. The patient tolerated the above procedures well without immediate postprocedural complication. FINDINGS: After catheter placement, the tip lies within the superior cavoatrial junction. The catheter aspirates and flushes normally and is ready for immediate use. Technically successful ultrasound-guided aspiration of 2 cc of serous, slightly blood tinged fluid from the ill-defined serpiginous fluid collection within the midline of the low back, subjacent to the well-healed midline surgical incision, correlating with the ill-defined fluid collection demonstrated on  preceding lumbar spine MRI.  IMPRESSION: 1. Successful ultrasound and fluoroscopic guided placement of a right basilic vein approach, 40 cm, 5 French, dual lumen PICC with tip at the superior caval-atrial junction. The PICC line is ready for immediate use. 2. Successful ultrasound-guided aspiration of approximately 2 cc of serous, slightly blood tinged fluid from the ill-defined fluid collection within the midline of the low back. All aspirated fluid was capped and sent to the laboratory for analysis. Note, the fluid collection is currently too small to allow for percutaneous drainage catheter placement. Electronically Signed   By: Sandi Mariscal M.D.   On: 06/21/2018 15:30    Review of Systems  Constitutional: Positive for malaise/fatigue and weight loss. Negative for chills and fever.  HENT: Negative for nosebleeds and sinus pain.   Eyes:       Patient wears glasses   Respiratory: Negative for cough, sputum production and shortness of breath.   Cardiovascular: Negative for chest pain.  Gastrointestinal:       Positive history of GERD.  Genitourinary: Negative for dysuria, flank pain, frequency, hematuria and urgency.  Musculoskeletal: Positive for back pain.  Skin: Negative for itching and rash.  Neurological: Negative for dizziness, seizures and headaches.  Psychiatric/Behavioral: Positive for depression. Negative for substance abuse and suicidal ideas.    Blood pressure (!) 149/98, pulse (!) 102, temperature 97.7 F (36.5 C), temperature source Oral, resp. rate 15, height _0  (1.88 m), weight 101.3 kg. Physical Exam  Constitutional: He is oriented to person, place, and time. He appears well-developed and well-nourished.  HENT:  Head: Normocephalic and atraumatic.  Eyes: Pupils are equal, round, and reactive to light. Conjunctivae and EOM are normal.  Neck: Normal range of motion. Neck supple. No tracheal deviation present. No thyromegaly present.  Cardiovascular: Normal rate and regular rhythm.  Respiratory: He is  in respiratory distress. He has no wheezes.  GI: Soft. He exhibits no distension. There is no abdominal tenderness.  Musculoskeletal:        General: No deformity or edema.     Comments: Well-healed lumbar incision.  Dressing present post interventional radiology aspiration.  No cellulitis.  Ankle dorsiflexion plantarflexion quads are intact he has been ambulatory without weakness.  Neurological: He is alert and oriented to person, place, and time.  Skin: Skin is warm and dry.  Psychiatric: He has a normal mood and affect. His behavior is normal. Judgment and thought content normal.     Assessment/Plan IV vancomycin and Zosyn per pharmacy dosing protocol.  Patient's white count is normal.  Will ask infectious disease to see him sometime this weekend.  Half-normal saline IV at 75 cc given to keep him well-hydrated with IV vancomycin.  Creatinine and BUN are normal.  Sed rate CRP ordered and pending.  Urinalysis ordered and pending.  Chest x-ray showed no evidence of pneumonia.  Discussed outlined plan with patient.  Antibiotics will be adjusted based on culture and infectious disease recommendations.  [name], MD 06/21/2018, 5:58 PM

## 2018-06-21 NOTE — Progress Notes (Signed)
Pharmacy Antibiotic Note  Lucas Morales is a 84 y.o. male admitted on 06/21/2018 with lumbar infection.  Pharmacy has been consulted for Vancomycin and Zosyn dosing.  Patient s/p US guided paraspinal fluid aspiration today  With cultures sent to lab for analysis. Patient is currently afebrile and WBC is within normal limits.   Plan: Zosyn 3.375g IV x1 then 3.375g IV every 8 hours - 4hr infusion Vancomycin 2000mg  IV x1 then 1000mg  IV every 12 hours. Goal AUC 400-550. Expected AUC: 445 SCr used: 0.96 Monitor renal function closely.  Will check vancomycin levels around 4th dose.      Temp (24hrs), Avg:97.7 F (36.5 C), Min:97.7 F (36.5 C), Max:97.7 F (36.5 C)  Recent Labs  Lab 06/21/18 1323  WBC 6.6    CrCl cannot be calculated (Patient's most recent lab result is older than the maximum 21 days allowed.).    Allergies  Allergen Reactions  . Morphine Itching    Antimicrobials this admission: Vancomycin 4/3 >> Zosyn 4/3 >>  Dose adjustments this admission:   Microbiology results: 4/3 Paraspinal fluid >> 4/3 Urine >>  Thank you for allowing pharmacy to be a part of this patient's care.  Sloan Leiter, PharmD, BCPS, BCCCP Clinical Pharmacist Please refer to [facility] for [place] numbers 06/21/2018 4:32 PM

## 2018-06-21 NOTE — Procedures (Signed)
Pre procedural Diagnosis: Poor venous access; Indeterminate paraspinal fluid collection Post Procedural Diagnosis: Same  Successful placement of right basilic vein approach dual lumen PICC line with tip at the superior caval-atrial junction.    Successful US guided aspiration of approximately 2 cc of serous, slightly blood tinged fluid from ill defined fluid collection within the midline of the lower back.   All aspirated fluid was capped and sent to the lab for analysis.  EBL: None No immediate post procedural complication.  The PICC line is ready for immediate use.  Ronny Bacon, MD Pager #: [phone]

## 2018-06-21 NOTE — Consult Note (Addendum)
Chief Complaint: Patient was seen in consultation today for paraspinous abscess aspiration/drain placement and PICC placement at the request of Lanae Crumbly  Referring Physician(s): Dr Olga Millers  Supervising Physician: Sandi Mariscal  Patient Status: [facility] - In-pt  History of Present Illness: ELZIA HOTT is a 85 y.o. male   Back surgery 05/10/18 Preop diagnosis: Recurrent severe spinal stenosis L3-4. Postop diagnosis: Same Procedure:reexploration   Bilateral  Decompression for recurrent stenosis L3-4 level. Operative microscope.  Partial facetectomy bilateral (reexploration )  Back pain did seem to abate somewhat Never fully went away Pain has become excruciating for several days  MR today:  IMPRESSION: 1. New focal, rounded 2.3 cm area of abnormal signal in the posterior L3 vertebral body, most concerning for osteomyelitis given recent surgery. The appearance could be seen with a metastatic lesion such as prostate cancer, although this is thought to be less likely given the normal marrow signal 3 months ago. 2. Interval repeat posterior decompression with new bilateral partial facetectomies at L3-L4. Resolved spinal canal stenosis. There is residual mild bilateral lateral recess stenosis and moderate bilateral neuroforaminal stenosis. 3. Unchanged moderate spinal canal stenosis at L1-L2. 4. Prior remote posterior decompression at L2-L3 with unchanged moderate bilateral lateral recess and neuroforaminal stenosis. 5. Paraspinous postsurgical changes centered at L3-L4 with loculated 2.8 x 1.4 x 7.9 cm fluid collection, likely a seroma.  ADDENDUM: The new abnormal signal in the posterior L3 vertebral body could also represent a small infarct. Correlation with clinical symptoms and inflammatory markers is still recommended to exclude Osteomyelitis.  Pt states back pain is horrible; radiates to right buttock and leg Into penis-- pain is intolerable  Request for  paraspinal abscess aspiration/drain placement per Dr Lorin Mercy Dr Pascal Lux has reviewed imaging and spoken to Dr Lorin Mercy-- Dr Pascal Lux approves procedure   Past Medical History:  Diagnosis Date  . Adenomatous colon polyp   . Alpha-1-antitrypsin deficiency ([place])   . Alpha-1-antitrypsin deficiency carrier   . Anemia   . Anxiety   . Arthritis    "knees; bad in my back" (09/01/2014)  . Bruises easily   . Chronic bronchitis ([place])    "get it pretty much q yr" (09/01/2014)  . Chronic lower back pain   . Coronary artery disease   . Depression    "related to health" (09/01/2014)  . Diarrhea    has had a part of colon removed  . Diverticulitis   . Diverticulosis   . Dyspnea on exertion   . Dysrhythmia    bradycardia in the 30's (01/11/2012).. Afib briefly after CABG  . Esophageal dysmotility   . Esophageal stricture   . Gastritis   . GERD (gastroesophageal reflux disease)    takes Pantoprazole daily  . Gout   . Hemorrhoids   . Hiatal hernia   . History of colon polyps   . Hyperlipidemia    doesn't require meds  . Hypertension    takes Losartan daily; current not taking   . IC (interstitial cystitis)   . Insomnia    takes Nortrypyline nightly  . Joint pain   . Joint swelling   . Macular hole of both eyes   . Nocturia   . OSA (obstructive sleep apnea)    "mild; tried CPAP; couldn't tolerate" (09/01/2014)  . Paroxysmal atrial fibrillation (HCC)   . Restless leg syndrome   . Sciatic nerve pain   . Type II diabetes mellitus ([place])    "borderline"  . Urinary frequency     Past  Surgical History:  Procedure Laterality Date  . [place] VITRECTOMY WITH 20 GAUGE MVR PORT FOR MACULAR HOLE Right 10/08/2012   Procedure: 25 GAUGE PARS PLANA VITRECTOMY WITH 20 GAUGE MVR PORT FOR MACULAR HOLE; Membrame peel, serum patch; Laser treatment ; Gas exchange;  Surgeon: Hayden Pedro, MD;  Location: [place];  Service: Ophthalmology;  Laterality: Right;  . 25 GAUGE PARS PLANA VITRECTOMY WITH 20  GAUGE MVR PORT FOR MACULAR HOLE Left 09/01/2014   Procedure: 25 GAUGE PARS PLANA VITRECTOMY WITH 20 GAUGE MVR PORT FOR MACULAR HOLE;  Surgeon: Hayden Pedro, MD;  Location: [place];  Service: Ophthalmology;  Laterality: Left;  . ANTERIOR CERVICAL DECOMP/DISCECTOMY FUSION N/A 12/20/2015   Procedure: C5-6 Anterior Cervical Discectomy and Fusion, Allograft, and Plate;  Surgeon: [name], MD;  Location: [place];  Service: Orthopedics;  Laterality: N/A;  . APPENDECTOMY  2009  . bladder lesion removed    . CARDIAC CATHETERIZATION  01/11/2012  . CATARACT EXTRACTION W/ INTRAOCULAR LENS  IMPLANT, BILATERAL Bilateral 2011  . CHOLECYSTECTOMY  2009  . COLONOSCOPY    . COMPRESSION HIP SCREW Left 04/04/2014   Procedure: COMPRESSION HIP LEFT;  Surgeon: Mcarthur Rossetti, MD;  Location: [place];  Service: Orthopedics;  Laterality: Left;  . CORONARY ANGIOPLASTY    . CORONARY ARTERY BYPASS GRAFT  01/18/2012   Procedure: CORONARY ARTERY BYPASS GRAFTING (CABG);  Surgeon: Ivin Poot, MD;  Location: [place];  Service: Open Heart Surgery;  Laterality: N/A;  times four, using left internal mammary artery  . CYSTOSCOPY WITH URETEROSCOPY  2008; 2009   "painted inside of bladder wall"  . ELBOW SURGERY Right 1988   "reconstructive OR"  . ENDOVEIN HARVEST OF GREATER SAPHENOUS VEIN  01/18/2012   Procedure: ENDOVEIN HARVEST OF GREATER SAPHENOUS VEIN;  Surgeon: Ivin Poot, MD;  Location: [place];  Service: Open Heart Surgery;  Laterality: Right;  . ESOPHAGOGASTRODUODENOSCOPY    . ESOPHAGOGASTRODUODENOSCOPY N/A 06/05/2014   Procedure: ESOPHAGOGASTRODUODENOSCOPY (EGD);  Surgeon: Irene Shipper, MD;  Location: [facility];  Service: Endoscopy;  Laterality: N/A;  . EXCISION/RELEASE BURSA HIP Left 07/30/2015   Procedure: EXCISION/RELEASE BURSA HIP;  Surgeon: Mcarthur Rossetti, MD;  Location: [facility];  Service: Orthopedics;  Laterality: Left;  . EXPLORATORY LAPAROTOMY  2009   with right colectomy  . FRACTURE SURGERY     . GAS INSERTION Left 09/01/2014   Procedure: INSERTION OF GAS;  Surgeon: Hayden Pedro, MD;  Location: [place];  Service: Ophthalmology;  Laterality: Left;  C3F8  . GAS/FLUID EXCHANGE Left 09/01/2014   Procedure: GAS/FLUID EXCHANGE;  Surgeon: Hayden Pedro, MD;  Location: [place];  Service: Ophthalmology;  Laterality: Left;  . HARDWARE REMOVAL Left 07/30/2015   Procedure: attempted LEFT HIP HARDWARE REMOVAL, LEFT HIP BURSECTOMY;  Surgeon: Mcarthur Rossetti, MD;  Location: [facility];  Service: Orthopedics;  Laterality: Left;  . HEEL SPUR EXCISION Bilateral 1970's  . INTERTROCHANTERIC HIP FRACTURE SURGERY Left 04/04/2014  . JOINT REPLACEMENT    . LEFT HEART CATH AND CORS/GRAFTS ANGIOGRAPHY N/A 05/28/2017   Procedure: LEFT HEART CATH AND CORS/GRAFTS ANGIOGRAPHY;  Surgeon: Belva Crome, MD;  Location: [facility];  Service: Cardiovascular;  Laterality: N/A;  . LEFT HEART CATHETERIZATION WITH CORONARY ANGIOGRAM N/A 01/11/2012   Procedure: LEFT HEART CATHETERIZATION WITH CORONARY ANGIOGRAM;  Surgeon: Leonie Man, MD;  Location: [facility];  Service: Cardiovascular;  Laterality: N/A;  . LUMBAR [place] SURGERY  ?2010   "  cleaned out the stenosis" (01/11/2012)  . LUMBAR LAMINECTOMY/DECOMPRESSION MICRODISCECTOMY N/A 05/10/2018   Procedure: redo lumbar decompression L3-4;  Surgeon: [name], MD;  Location: [place];  Service: Orthopedics;  Laterality: N/A;  . MEMBRANE PEEL Left 09/01/2014   Procedure: MEMBRANE PEEL;  Surgeon: Hayden Pedro, MD;  Location: [place] [place];  Service: Ophthalmology;  Laterality: Left;  . NM MYOCAR MULTIPLE W/SPECT  04/09/2012   NORMAL STRESS NUCLEAR STUDY. EF 51%.  [name] PARS PLANA VITRECTOMY W/ REPAIR OF MACULAR HOLE Left 09/01/2014  . PARTIAL COLECTOMY     d/t diverticulosis  . PHOTOCOAGULATION WITH LASER Left 09/01/2014   Procedure: PHOTOCOAGULATION WITH LASER;  Surgeon: Hayden Pedro, MD;  Location: [place];  Service: Ophthalmology;  Laterality: Left;  HEADSCOPE LASER  .  RECONSTRUCTION MEDIAL COLLATERAL LIGAMENT ELBOW W/ TENDON GRAFT Right 1988 X 2  . SERUM PATCH Left 09/01/2014   Procedure: SERUM PATCH;  Surgeon: Hayden Pedro, MD;  Location: [place];  Service: Ophthalmology;  Laterality: Left;  . TONSILLECTOMY AND ADENOIDECTOMY  ~1943  . TOTAL KNEE ARTHROPLASTY Bilateral ~ 2001; 2011   right; left  . TRANSTHORACIC ECHOCARDIOGRAM  01/04/2012   MODERATE SEVERE-SEVERE LVH. EF=>55%. MILDLY IMPAIRED LV RELAXATION. LA IS MODERATE TO SEVERE DILATED. MODERATE MR. MV LEAFLETS APPEAR THICHENED.  [name] TUMOR EXCISION Left 12/2011   arm    Allergies: Morphine  Medications: Prior to Admission medications   Medication Sig Start Date End Date Taking? Authorizing Provider  allopurinol (ZYLOPRIM) 300 MG tablet Take 300 mg by mouth daily. 05/05/15  Yes [provider]  aspirin EC 81 MG tablet Take 81 mg by mouth daily.   Yes [provider]  atorvastatin (LIPITOR) 40 MG tablet  05/13/18  Yes [provider]  metoprolol succinate (TOPROL-XL) 50 MG 24 hr tablet Take 1 tablet (50 mg total) by mouth daily. 05/06/18  Yes Almyra Deforest, PA  oxyCODONE-acetaminophen (PERCOCET/ROXICET) 5-325 MG tablet One po q 3 to 4 hrs prn pain 05/21/18  Yes [name], MD  oxyCODONE-acetaminophen (PERCOCET/ROXICET) 5-325 MG tablet Take 1 tablet by mouth every 4 (four) hours as needed for severe pain. 06/18/18  Yes [name], MD  pantoprazole (PROTONIX) 40 MG tablet Take 1 tablet (40 mg total) by mouth 2 (two) times daily. 06/06/14  Yes Charlynne Cousins, MD  predniSONE (STERAPRED UNI-PAK 21 TAB) 5 MG (21) TBPK tablet Take as instruction , take with food, 6 tablets the first day, then 5,4,3,2,1. Then stop 06/10/18  Yes [name], MD  tolnaftate (TINACTIN) 1 % cream Apply 1 application topically 3 (three) times daily as needed.   Yes [provider]  tolnaftate (TINACTIN) 1 % spray Apply topically 3 (three) times daily.   Yes [provider]   oxyCODONE-acetaminophen (PERCOCET/ROXICET) 5-325 MG tablet Take 1-2 tablets by mouth every 6 (six) hours as needed for severe pain. Patient not taking: Reported on 05/17/2018 05/10/18   Lanae Crumbly, PA-C  oxyCODONE-acetaminophen (PERCOCET/ROXICET) 5-325 MG tablet Take 1 tablet by mouth every 6 (six) hours as needed for severe pain. Patient not taking: Reported on 05/24/2018 05/16/18   [name], MD     Family History  Problem Relation Age of Onset  . Emphysema Mother   . Kidney disease Father   . Emphysema Maternal Grandfather   . Colon cancer Neg Hx     Social History   Socioeconomic History  . Marital status: Married    Spouse name: Not on file  .  Number of children: 2  . Years of education: Not on file  . Highest education level: Not on file  Occupational History  . Occupation: Retired    Fish farm manager: RETIRED  Social Needs  . Financial resource strain: Not on file  . Food insecurity:    Worry: Not on file    Inability: Not on file  . Transportation needs:    Medical: Not on file    Non-medical: Not on file  Tobacco Use  . Smoking status: Never Smoker  . Smokeless tobacco: Never Used  Substance and Sexual Activity  . Alcohol use: No    Comment: 09/01/2014 "glass of wine a few times/yr; if that"  . Drug use: No  . Sexual activity: Yes  Lifestyle  . Physical activity:    Days per week: Not on file    Minutes per session: Not on file  . Stress: Not on file  Relationships  . Social connections:    Talks on phone: Not on file    Gets together: Not on file    Attends religious service: Not on file    Active member of club or organization: Not on file    Attends meetings of clubs or organizations: Not on file    Relationship status: Not on file  Other Topics Concern  . Not on file  Social History Narrative  . Not on file    Review of Systems: A 12 point ROS discussed and pertinent positives are indicated in the HPI above.  All other systems are negative.   Review of Systems  Constitutional: Positive for activity change and appetite change. Negative for fever.  Respiratory: Negative for cough and shortness of breath.   Cardiovascular: Negative for chest pain.  Gastrointestinal: Negative for abdominal pain.  Musculoskeletal: Positive for back pain and gait problem.  Psychiatric/Behavioral: Negative for behavioral problems and confusion.    Vital Signs: BP (!) 142/84 (BP Location: Right Arm)   Pulse 99   Temp 97.7 F (36.5 C) (Oral)   Resp (!) 22   SpO2 99%   Physical Exam Vitals signs reviewed.  Constitutional:      Comments: Appears in pain Writhing on bed  Cardiovascular:     Rate and Rhythm: Normal rate and regular rhythm.     Heart sounds: Normal heart sounds.  Pulmonary:     Breath sounds: Normal breath sounds.  Abdominal:     General: Bowel sounds are normal.  Musculoskeletal: Normal range of motion.  Skin:    General: Skin is warm and dry.  Neurological:     Mental Status: He is alert and oriented to person, place, and time.  Psychiatric:        Mood and Affect: Mood normal.        Behavior: Behavior normal.        Thought Content: Thought content normal.        Judgment: Judgment normal.     Imaging: Mr Lumbar Spine W/o Contrast  Addendum Date: 06/21/2018   ADDENDUM REPORT: 06/21/2018 13:25 ADDENDUM: The new abnormal signal in the posterior L3 vertebral body could also represent a small infarct. Correlation with clinical symptoms and inflammatory markers is still recommended to exclude osteomyelitis. Electronically Signed   By: Titus Dubin M.D.   On: 06/21/2018 13:25   Result Date: 06/21/2018 CLINICAL DATA:  Low back pain radiating to the right buttock and leg to the knee for the past 3 months. Bilateral leg numbness and weakness. Recent surgery in February.  EXAM: MRI LUMBAR SPINE WITHOUT CONTRAST TECHNIQUE: Multiplanar, multisequence MR imaging of the lumbar spine was performed. No intravenous contrast was  administered. COMPARISON:  MRI lumbar spine dated March 31, 2018. FINDINGS: Segmentation:  Unchanged partial sacralization of L5 on the left. Alignment:  Unchanged 4 mm anterolisthesis at L4-L5. Vertebrae: No fracture. New focal, rounded 2.3 cm area of slightly T2 hypointense, T1 hypointense, slightly stir hyperintense marrow signal in the posterior L3 vertebral body with a curvilinear rim of T2/STIR hyperintensity. Conus medullaris and cauda equina: Conus extends to the T12-L1 level. Conus and cauda equina appear normal. Paraspinal and other soft tissues: Paraspinous postsurgical changes centered at L3-L4 with loculated 2.8 x 1.4 x 7.9 cm fluid collection. No epidural fluid collection. Disc levels: T12-L1: Unchanged mild disc bulging and mild bilateral facet arthropathy. Unchanged mild right neuroforaminal stenosis. No spinal canal or left neuroforaminal stenosis. L1-L2: Unchanged small shallow broad-based posterior disc protrusion and mild bilateral facet arthropathy with ligamentum flavum hypertrophy resulting and moderate spinal canal and mild bilateral neuroforaminal stenosis. L2-L3: Prior posterior decompression. Unchanged small shallow broad-based posterior disc protrusion and mild bilateral facet arthropathy. Unchanged moderate bilateral lateral recess and neuroforaminal stenosis. No spinal canal stenosis. L3-L4: Prior posterior decompression with interval bilateral partial facetectomy. Unchanged small broad-based posterior disc protrusion and severe bilateral facet arthropathy. Resolved spinal canal stenosis. Residual mild bilateral lateral recess stenosis and moderate bilateral neuroforaminal stenosis. L4-L5: Unchanged mild disc uncovering and disc bulging with prior bilateral facet joint ankylosis. Unchanged mild bilateral lateral recess and neuroforaminal stenosis. No spinal canal stenosis. L5-S1: Negative disc. Unchanged moderate right and mild left facet arthropathy. No stenosis. IMPRESSION: 1. New  focal, rounded 2.3 cm area of abnormal signal in the posterior L3 vertebral body, most concerning for osteomyelitis given recent surgery. The appearance could be seen with a metastatic lesion such as prostate cancer, although this is thought to be less likely given the normal marrow signal 3 months ago. 2. Interval repeat posterior decompression with new bilateral partial facetectomies at L3-L4. Resolved spinal canal stenosis. There is residual mild bilateral lateral recess stenosis and moderate bilateral neuroforaminal stenosis. 3. Unchanged moderate spinal canal stenosis at L1-L2. 4. Prior remote posterior decompression at L2-L3 with unchanged moderate bilateral lateral recess and neuroforaminal stenosis. 5. Paraspinous postsurgical changes centered at L3-L4 with loculated 2.8 x 1.4 x 7.9 cm fluid collection, likely a seroma. These results will be called to the ordering clinician or representative by the Radiologist Assistant, and communication documented in the PACS or zVision Dashboard. Electronically Signed: By: Titus Dubin M.D. On: 06/21/2018 10:10    Labs:  CBC: Recent Labs    05/03/18 1535 06/21/18 1323  WBC 7.4 6.6  HGB 13.5 11.5*  HCT 41.0 35.3*  PLT 240 241    COAGS: Recent Labs    06/21/18 1323  INR 1.1    BMP: Recent Labs    05/03/18 1535  NA 138  K 4.1  CL 106  CO2 20*  GLUCOSE 108*  BUN 23  CALCIUM 9.8  CREATININE 1.15  GFRNONAA 59*  GFRAA >60    LIVER FUNCTION TESTS: Recent Labs    05/03/18 1535  BILITOT 0.9  AST 39  ALT 34  ALKPHOS 221*  PROT 7.1  ALBUMIN 3.9    TUMOR MARKERS: No results for input(s): AFPTM, CEA, CA199, CHROMGRNA in the last 8760 hours.  Assessment and Plan:  Paraspinous abscess aspiration/drain placement scheduled now Also for Peripherally inserted central catheter placement Plan to be admitted per Dr Lorin Mercy  after procedure Risks and benefits discussed with the patient including bleeding, infection, damage to adjacent  structures, and sepsis.  All of the patient's questions were answered, patient is agreeable to proceed. Consent signed and in chart.  Thank you for this interesting consult.  I greatly enjoyed meeting JAYLN MADEIRA and look forward to participating in their care.  A copy of this report was sent to the requesting provider on this date.  Electronically Signed: Lavonia Drafts, PA-C 06/21/2018, 2:00 PM   I spent a total of 40 Minutes    in face to face in clinical consultation, greater than 50% of which was counseling/coordinating care for paraspinal abscess asp/drain and PICC placement

## 2018-06-21 NOTE — Progress Notes (Signed)
Dr. Lorin Mercy called patient with results from MRI Lumbar Spine. Patient to present to [place] and will go to Interventional Radiology for aspiration/drain L3 under CT guidance as well as PICC Line Placement.  Patient to then be admitted under Dr. Lorin Mercy service.

## 2018-06-22 DIAGNOSIS — Z9889 Other specified postprocedural states: Secondary | ICD-10-CM

## 2018-06-22 DIAGNOSIS — Z885 Allergy status to narcotic agent status: Secondary | ICD-10-CM

## 2018-06-22 DIAGNOSIS — M48061 Spinal stenosis, lumbar region without neurogenic claudication: Secondary | ICD-10-CM

## 2018-06-22 LAB — BASIC METABOLIC PANEL
Anion gap: 8 (ref 5–15)
BUN: 18 mg/dL (ref 8–23)
CO2: 22 mmol/L (ref 22–32)
Calcium: 9 mg/dL (ref 8.9–10.3)
Chloride: 107 mmol/L (ref 98–111)
Creatinine, Ser: 0.97 mg/dL (ref 0.61–1.24)
GFR calc Af Amer: >60 mL/min (ref >60)
GFR calc non Af Amer: >60 mL/min (ref >60)
Glucose, Bld: 124 mg/dL — ABNORMAL HIGH (ref 70–99)
Potassium: 4 mmol/L (ref 3.5–5.1)
Sodium: 137 mmol/L (ref 135–145)

## 2018-06-22 LAB — URINE CULTURE: Culture: NO GROWTH

## 2018-06-22 LAB — CBC
HCT: 33.4 % — ABNORMAL LOW (ref 39.0–52.0)
Hemoglobin: 10.7 g/dL — ABNORMAL LOW (ref 13.0–17.0)
MCH: 31.9 pg (ref 26.0–34.0)
MCHC: 32 g/dL (ref 30.0–36.0)
MCV: 99.7 fL (ref 80.0–100.0)
Platelets: 225 10*3/uL (ref 150–400)
RBC: 3.35 MIL/uL — ABNORMAL LOW (ref 4.22–5.81)
RDW: 15.5 % (ref 11.5–15.5)
WBC: 6.5 10*3/uL (ref 4.0–10.5)
nRBC: 0 % (ref 0.0–0.2)

## 2018-06-22 LAB — GLUCOSE, CAPILLARY
Glucose-Capillary: 115 mg/dL — ABNORMAL HIGH (ref 70–99)
Glucose-Capillary: 133 mg/dL — ABNORMAL HIGH (ref 70–99)
Glucose-Capillary: 103 mg/dL — ABNORMAL HIGH (ref 70–99)

## 2018-06-22 MED ORDER — SODIUM CHLORIDE 0.9 % IV SOLN
2.0000 g | INTRAVENOUS | Status: DC
  Administered 2018-06-22 – 2018-06-24 (×3): 2 g via INTRAVENOUS
  Filled 2018-06-22 (×4): qty 20

## 2018-06-22 MED ORDER — ADULT MULTIVITAMIN W/MINERALS CH
1.0000 | ORAL_TABLET | Freq: Every day | ORAL | Status: DC
  Administered 2018-06-23 – 2018-07-19 (×25): 1 via ORAL
  Filled 2018-06-22 (×25): qty 1

## 2018-06-22 MED ORDER — ENSURE ENLIVE PO LIQD
237.0000 mL | Freq: Two times a day (BID) | ORAL | Status: DC
  Administered 2018-06-22 – 2018-06-24 (×3): 237 mL via ORAL

## 2018-06-22 MED ORDER — MENTHOL 3 MG MT LOZG
1.0000 | LOZENGE | OROMUCOSAL | Status: DC | PRN
  Filled 2018-06-22: qty 9

## 2018-06-22 NOTE — Consult Note (Signed)
[facility] for Infectious Disease       Reason for Consult:  osteomyelitis   Referring Physician: Dr. 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 docusate sodium  100 mg Oral BID  . insulin aspart  0-15 Units Subcutaneous TID WC  . sodium chloride flush  10-40 mL Intracatheter Q12H    Recommendations: Vancomycin and ceftriaxone for 6-8 weeks  Will continue to monitor culture  Assessment: He has had recent bilateral decompression for recurrent stenosis at L3-4 level 05/10/18 and had noted persistent pain in the spine area, poor po intake and MRI findings yesterday with concerns of osteomyelitis vs infarct.  Aspiration done by IR yesterday and no growth yet.  Though infection is most likely, if no growth on the culture, and no clinical improvement in 1-2 weeks, consideration of other etiologies mentioned on the MRI report will need to be considered.    Antibiotics: Vancomycin and pip tazo  HPI: Lucas Morales is a 85 y.o. male with history of spinal stenosis with recent surgery and persistent pain.  MRI as above and underwent IR aspiration with culture pending.  No gram stain reported yet.  Picc line has been placed.  He complains of continued significant pain, particularly with movement.  He was started on vancomycin and Zosyn.     Review of Systems:  Constitutional: negative for fevers, chills and anorexia Gastrointestinal: negative for nausea and diarrhea All other systems reviewed and are negative    Past Medical History:  Diagnosis Date  . Adenomatous colon polyp   . Alpha-1-antitrypsin deficiency ([place])   . Alpha-1-antitrypsin deficiency carrier   . Anemia   . Anxiety   . Arthritis    "knees; bad in my back" (09/01/2014)  . Bruises easily   . Chronic bronchitis ([place])    "get it pretty much q yr" (09/01/2014)  . Chronic lower back pain   . Coronary artery disease   . Depression    "related to health" (09/01/2014)  . Diarrhea    has had  a part of colon removed  . Diverticulitis   . Diverticulosis   . Dyspnea on exertion   . Dysrhythmia    bradycardia in the 30's (01/11/2012).. Afib briefly after CABG  . Esophageal dysmotility   . Esophageal stricture   . Gastritis   . GERD (gastroesophageal reflux disease)    takes Pantoprazole daily  . Gout   . Hemorrhoids   . Hiatal hernia   . History of colon polyps   . Hyperlipidemia    doesn't require meds  . Hypertension    takes Losartan daily; current not taking   . IC (interstitial cystitis)   . Insomnia    takes Nortrypyline nightly  . Joint pain   . Joint swelling   . Macular hole of both eyes   . Nocturia   . OSA (obstructive sleep apnea)    "mild; tried CPAP; couldn't tolerate" (09/01/2014)  . Paroxysmal atrial fibrillation (H[facility])   . Restless leg syndrome   . Sciatic nerve pain   . Type II diabetes mellitus ([place])    "borderline"  . Urinary frequency     Social History   Tobacco Use  . Smoking status: Never Smoker  . Smokeless tobacco: Never Used  Substance Use Topics  . Alcohol use: Yes    Comment: 09/01/2014 "glass of wine a few times/yr; if that"  . Drug use: No    Comment: CDD cream  for pain to hip tried recently     Family History  Problem Relation Age of Onset  . Emphysema Mother   . Kidney disease Father   . Emphysema Maternal Grandfather   . Colon cancer Neg Hx     Allergies  Allergen Reactions  . Morphine Itching    Physical Exam: Constitutional: in moderate distress due to pain Vitals:   06/21/18 1652  BP: (!) 149/98  Pulse: (!) 102  Resp: 15  Temp: 97.7 F (36.5 C)   EYES: anicteric ENMT: no thrush Cardiovascular: Cor RRR Respiratory: CTA B;normal respiratory effort GI: Bowel sounds are normal, liver is not enlarged, spleen is not enlarged Musculoskeletal: no pedal edema noted Skin: negatives: no rash Hematologic: no cervical lad  Lab Results  Component Value Date   WBC 6.5 06/22/2018   HGB 10.7 (L) 06/22/2018    HCT 33.4 (L) 06/22/2018   MCV 99.7 06/22/2018   PLT 225 06/22/2018    Lab Results  Component Value Date   CREATININE 0.97 06/22/2018   BUN 18 06/22/2018   NA 137 06/22/2018   K 4.0 06/22/2018   CL 107 06/22/2018   CO2 22 06/22/2018    Lab Results  Component Value Date   ALT 43 06/21/2018   AST 43 (H) 06/21/2018   ALKPHOS 412 (H) 06/21/2018     Microbiology: No results found for this or any previous visit (from the past 240 hour(s)).  Thayer Headings, MD [facility] [email or web address] 06/22/2018, 12:22 PM

## 2018-06-22 NOTE — Plan of Care (Signed)
  Problem: Safety: Goal: Ability to remain free from injury will improve Outcome: Progressing   Problem: Skin Integrity: Goal: Risk for impaired skin integrity will decrease Outcome: Progressing   

## 2018-06-22 NOTE — Progress Notes (Signed)
Initial Nutrition Assessment  DOCUMENTATION CODES:   Not applicable  INTERVENTION:   Ensure Enlive po BID, each supplement provides 350 kcal and 20 grams of protein  MVI daily   NUTRITION DIAGNOSIS:   Increased nutrient needs related to post-op healing as evidenced by increased estimated needs.  GOAL:   Patient will meet greater than or equal to 90% of their needs  MONITOR:   PO intake, Supplement acceptance, Labs, Weight trends, I & O's, Skin  REASON FOR ASSESSMENT:   Malnutrition Screening Tool    ASSESSMENT:   85 year old male lives alone is admitted for suspected postoperative lumbar spine infection s/p surgery for severe stenosis at L3-4  RD working remotely.  Pt currently eating 40% of meals in hospital. Per chart, pt appears to have lost 16lbs(7%) in <3 months; this is not significant but worth noting. RD will add supplements and MVI to help pt meet his estimated needs and encourage healing.   Medications reviewed and include: colace, insulin, NaCl @75ml /hr, ceftriaxone, vancomycin    Labs reviewed: Hgb 10.7(L), Hct 33.4(L) cbgs- 105, 168, 115 x 24 hrs AIC 6.8(H)- 2/14  Unable to complete Nutrition-Focused physical exam at this time.   Diet Order:   Diet Order            Diet Carb Modified Fluid consistency: Thin; Room service appropriate? Yes  Diet effective now             EDUCATION NEEDS:   Not appropriate for education at this time  Skin:  Skin Assessment: Reviewed RN Assessment(incision back )  Last BM:  4/3  Height:   Ht Readings from Last 1 Encounters:  06/21/18 6\' 2"  (1.88 m)    Weight:   Wt Readings from Last 1 Encounters:  06/21/18 101.3 kg    Ideal Body Weight:  86.3 kg  BMI:  Body mass index is 28.67 kg/m.  Estimated Nutritional Needs:   Kcal:  2000-2300kcal/day   Protein:  100-120g/day   Fluid:  >2.1L/day   Lucas Distance MS, RD, LDN Pager #- [phone] Office#- [phone] After Hours Pager: [phone]

## 2018-06-22 NOTE — Progress Notes (Signed)
Patient ID: GOMER FRANCE, male   DOB: 06/15/1933, 85 y.o.   MRN: [id number] Patient has a lumbar spine infection.  This was drained by interventional radiology and patient states he will not need any further drainage per interventional radiology.  He has had a PICC line placed he is currently on Vanco and Zosyn.  Plan for antibiotic treatments per infectious disease and surgical decisions as per Dr. Lorin Mercy.

## 2018-06-23 LAB — VANCOMYCIN, PEAK: Vancomycin Pk: 18 ug/mL — ABNORMAL LOW (ref 30–40)

## 2018-06-23 LAB — VANCOMYCIN, TROUGH: Vancomycin Tr: 10 ug/mL — ABNORMAL LOW (ref 15–20)

## 2018-06-23 MED ORDER — VANCOMYCIN HCL 10 G IV SOLR
1250.0000 mg | Freq: Two times a day (BID) | INTRAVENOUS | Status: DC
  Administered 2018-06-24 – 2018-06-25 (×3): 1250 mg via INTRAVENOUS
  Filled 2018-06-23 (×5): qty 1250

## 2018-06-23 NOTE — Progress Notes (Signed)
Patient ID: Lucas Morales, male   DOB: October 03, 1933, 85 y.o.   MRN: [id number] Patient's cultures and Gram stain remained negative.  Patient had urinary retention a Foley catheter was placed.  Patient had over thousand cc urine.  Patient states this is happened to him in the past.  White cell count 6.5 hemoglobin 10.7

## 2018-06-23 NOTE — Progress Notes (Addendum)
Called to room by pt requesting for foley catheter to be placed.Pt c/o peeing small amounts only, unable to completely empty bladder.Pt stated he had this issue before and foley catheter helped him. Noted pt. bladder distended. Bladder scan showed greater than 939ml. Paged on call Dr. Sharol Given at [phone] via answering [phone]). Second paged Dr Sharol Given at [phone] awaiting for reply.

## 2018-06-23 NOTE — Progress Notes (Signed)
Pharmacy Antibiotic Note  Lucas Morales is a 85 y.o. male admitted on 06/21/2018 with Lumbar Infection.  Pharmacy has been consulted for Vancomycin dosing.   Vancomyicn peak drawn at 10:26 with a level of 60mcg/ml after dose was administered at 06:18. Trough came back today at 18:30 with a level of 53mcg/ml.   Creatinine clearance stale at 74.35ml/min. Afebrile, RR normal, HR normal.    Plan: Continue Ceftriaxone 2g every 24 hours Increase vancomycin 1250mg  IV every 12 hours Follow-up renal function, vancomycin levels as needed, LOT and de-escalation if possible.   Height: 6\' 2"  (188 cm) Weight: 223 lb 4.8 oz (101.3 kg) IBW/kg (Calculated) : 82.2  Temp (24hrs), Avg:98.4 F (36.9 C), Min:98.1 F (36.7 C), Max:98.6 F (37 C)  Recent Labs  Lab 06/21/18 1323 06/21/18 1642 06/22/18 0320 06/23/18 1026 06/23/18 1830  WBC 6.6 7.2 6.5  --   --   CREATININE  --  0.96 0.97  --   --   VANCOTROUGH  --   --   --   --  10*  VANCOPEAK  --   --   --  18*  --     Estimated Creatinine Clearance: 74.6 mL/min (by C-G formula based on SCr of 0.97 mg/dL).    Allergies  Allergen Reactions  . Morphine Itching    Antimicrobials this admission: Vancomycin 4/3>> Zosyn 4/3>>4/4 Ceftriaxone 4/4 >>  Dose adjustments this admission: Vancomycin 1000mg  IV q12h increased to 120mg  IV q12h based on AUC dosing nomogram  Microbiology results: Urine culture 4/4>> negative    Thank you for the interesting consult and for involving pharmacy in this patient's care.  Tamela Gammon, PharmD 06/23/2018 7:15 PM PGY-1 Pharmacy Resident Direct Phone: [phone] Please check [email or web address] for unit-specific pharmacist phone numbers

## 2018-06-23 NOTE — Progress Notes (Signed)
Pt refused CBG checks at bedtime.

## 2018-06-24 ENCOUNTER — Inpatient Hospital Stay (HOSPITAL_COMMUNITY): Payer: Medicare Other

## 2018-06-24 DIAGNOSIS — M4626 Osteomyelitis of vertebra, lumbar region: Secondary | ICD-10-CM

## 2018-06-24 DIAGNOSIS — R1909 Other intra-abdominal and pelvic swelling, mass and lump: Secondary | ICD-10-CM

## 2018-06-24 DIAGNOSIS — D490 Neoplasm of unspecified behavior of digestive system: Secondary | ICD-10-CM

## 2018-06-24 LAB — PSA: Prostatic Specific Antigen: 2.08 ng/mL (ref 0.00–4.00)

## 2018-06-24 LAB — GLUCOSE, CAPILLARY
Glucose-Capillary: 121 mg/dL — ABNORMAL HIGH (ref 70–99)
Glucose-Capillary: 113 mg/dL — ABNORMAL HIGH (ref 70–99)
Glucose-Capillary: 120 mg/dL — ABNORMAL HIGH (ref 70–99)
Glucose-Capillary: 126 mg/dL — ABNORMAL HIGH (ref 70–99)
Glucose-Capillary: 152 mg/dL — ABNORMAL HIGH (ref 70–99)

## 2018-06-24 MED ORDER — TAMSULOSIN HCL 0.4 MG PO CAPS
0.4000 mg | ORAL_CAPSULE | Freq: Every day | ORAL | Status: DC
  Administered 2018-06-24 – 2018-07-19 (×24): 0.4 mg via ORAL
  Filled 2018-06-24 (×24): qty 1

## 2018-06-24 MED ORDER — IOHEXOL 300 MG/ML  SOLN
100.0000 mL | Freq: Once | INTRAMUSCULAR | Status: AC | PRN
  Administered 2018-06-24: 100 mL via INTRAVENOUS

## 2018-06-24 NOTE — Plan of Care (Signed)
  Problem: Safety: Goal: Ability to remain free from injury will improve Outcome: Progressing   Problem: Skin Integrity: Goal: Risk for impaired skin integrity will decrease Outcome: Progressing   

## 2018-06-24 NOTE — Progress Notes (Signed)
Subjective: No new complaints   Antibiotics:  Anti-infectives (From admission, onward)   Start     Dose/Rate Route Frequency Ordered Stop   06/24/18 0700  vancomycin (VANCOCIN) 1,250 mg in sodium chloride 0.9 % 250 mL IVPB     1,250 mg 166.7 mL/hr over 90 Minutes Intravenous Every 12 hours 06/23/18 1916     06/22/18 1300  cefTRIAXone (ROCEPHIN) 2 g in sodium chloride 0.9 % 100 mL IVPB     2 g 200 mL/hr over 30 Minutes Intravenous Every 24 hours 06/22/18 1217     06/22/18 0700  vancomycin (VANCOCIN) 1,000 mg in sodium chloride 0.9 % 250 mL IVPB  Status:  Discontinued     1,000 mg 250 mL/hr over 60 Minutes Intravenous Every 12 hours 06/21/18 1813 06/23/18 1916   06/22/18 0300  piperacillin-tazobactam (ZOSYN) IVPB 3.375 g  Status:  Discontinued     3.375 g 12.5 mL/hr over 240 Minutes Intravenous Every 8 hours 06/21/18 1813 06/22/18 1217   06/21/18 1700  piperacillin-tazobactam (ZOSYN) IVPB 3.375 g     3.375 g 100 mL/hr over 30 Minutes Intravenous  Once 06/21/18 1650 06/21/18 1847   06/21/18 1700  vancomycin (VANCOCIN) 2,000 mg in sodium chloride 0.9 % 500 mL IVPB     2,000 mg 250 mL/hr over 120 Minutes Intravenous  Once 06/21/18 1658 06/21/18 2200      Medications: Scheduled Meds: . docusate sodium  100 mg Oral BID  . feeding supplement (ENSURE ENLIVE)  237 mL Oral BID BM  . insulin aspart  0-15 Units Subcutaneous TID WC  . multivitamin with minerals  1 tablet Oral Daily  . sodium chloride flush  10-40 mL Intracatheter Q12H  . tamsulosin  0.4 mg Oral Daily   Continuous Infusions: . sodium chloride 75 mL/hr at 06/24/18 0619  . cefTRIAXone (ROCEPHIN)  IV 2 g (06/24/18 1223)  . vancomycin 166.7 mL/hr at 06/24/18 0619   PRN Meds:.menthol-cetylpyridinium, ondansetron **OR** ondansetron (ZOFRAN) IV, oxyCODONE, polyethylene glycol, sodium chloride flush, sodium phosphate    Objective: Weight change:   Intake/Output Summary (Last 24 hours) at 06/24/2018 1249 Last  data filed at 06/24/2018 1037 Gross per 24 hour  Intake 1792.14 ml  Output 1325 ml  Net 467.14 ml   Blood pressure 136/79, pulse 92, temperature 97.9 F (36.6 C), temperature source Oral, resp. rate 17, height 6\' 2"  (1.88 m), weight 101.3 kg, SpO2 95 %. Temp:  [97.9 F (36.6 C)-98.7 F (37.1 C)] 98.3 F (36.8 C) (04/06 1248) Pulse Rate:  [92-101] 101 (04/06 1248) Resp:  [17-18] 17 (04/06 1248) BP: (136-150)/(57-84) 150/57 (04/06 1248) SpO2:  [95 %-96 %] 96 % (04/06 1248)  Physical Exam: General: Alert and awake, oriented x3, not in any acute distress. HEENT: anicteric sclera, EOMI CVS regular rate, normal  Chest: , no wheezing, no respiratory distress Abdomen: soft non-distended,  Extremities: no edema or deformity noted bilaterally Skin: no rashes Neuro: nonfocal  CBC:    BMET Recent Labs    06/21/18 1642 06/22/18 0320  NA 140 137  K 4.3 4.0  CL 111 107  CO2 23 22  GLUCOSE 119* 124*  BUN 19 18  CREATININE 0.96 0.97  CALCIUM 9.2 9.0     Liver Panel  Recent Labs    06/21/18 1642  PROT 5.9*  ALBUMIN 3.2*  AST 43*  ALT 43  ALKPHOS 412*  BILITOT 0.9       Sedimentation Rate Recent Labs    06/21/18  1642  ESRSEDRATE 65*   C-Reactive Protein Recent Labs    06/21/18 1642  CRP 2.8*    Micro Results: Recent Results (from the past 720 hour(s))  Aerobic/Anaerobic Culture (surgical/deep wound)     Status: None (Preliminary result)   Collection Time: 06/21/18  3:00 PM  Result Value Ref Range Status   Specimen Description ABSCESS  Final   Special Requests LOWER LUMBAR  Final   Gram Stain NO WBC SEEN NO ORGANISMS SEEN   Final   Culture   Final    NO GROWTH 3 DAYS NO ANAEROBES ISOLATED; CULTURE IN PROGRESS FOR 5 DAYS Performed at [facility], [place] [address], [place], [place] [address]    Report Status PENDING  Incomplete  Urine culture     Status: None   Collection Time: 06/21/18  5:05 PM  Result Value Ref Range Status   Specimen  Description URINE, RANDOM  Final   Special Requests NONE  Final   Culture   Final    NO GROWTH Performed at [facility], [address] [place], [place] [address]    Report Status 06/22/2018 FINAL  Final    Studies/Results: No results found.    Assessment/Plan:  INTERVAL HISTORY: Cultures are unrevealing.   Active Problems:   Infection of lumbar spine ([place])    Lucas Morales is a 85 y.o. male with stable vertebral osteomyelitis in the L3-L4 region status post IR guided aspirate.  By these negative cultures I would still be concerned about infection given his clinical story. If no further biopsies are being pursued to look for a different diagnosis I would err on side of treating him  #1  Vertebral osteomyelitis: Again if nothing else is proven to be an alternative diagnosis I would favor erring on the side of treating him.  Our plan was to give him IV vancomycin and ceftriaxone for 6 weeks.  The patient that was concerned about having someone have to "break quarantine to help take care of him.  He was proposing that he give the antibiotics to himself which I do not think is realistic.  If he is indeed going to be living with a relative then that should be easier for the relative to help him with administration of IV antibiotics.  I am also not opposed to trying to send him home with an oral antibiotic regimen though this would clearly be suboptimal for treating for osteomyelitis     LOS: 3 days   Lucas Morales 06/24/2018, 12:49 PM

## 2018-06-24 NOTE — Progress Notes (Signed)
   Subjective:    Patient reports pain as moderate.    Objective: Vital signs in last 24 hours: Temp:  [97.9 F (36.6 C)-98.7 F (37.1 C)] 97.9 F (36.6 C) (04/06 0526) Pulse Rate:  [92-98] 92 (04/06 0526) Resp:  [17-18] 17 (04/06 0526) BP: (136-148)/(79-84) 136/79 (04/06 0526) SpO2:  [95 %-96 %] 95 % (04/06 0526)  Intake/Output from previous day: 04/05 0701 - 04/06 0700 In: 1672.1 [I.V.:1671.7; IV Piggyback:0.5] Out: 1225 [Urine:1225] Intake/Output this shift: No intake/output data recorded.  Recent Labs    06/21/18 1323 06/21/18 1642 06/22/18 0320  HGB 11.5* 11.6* 10.7*   Recent Labs    06/21/18 1642 06/22/18 0320  WBC 7.2 6.5  RBC 3.51* 3.35*  HCT 34.2* 33.4*  PLT 226 225   Recent Labs    06/21/18 1642 06/22/18 0320  NA 140 137  K 4.3 4.0  CL 111 107  CO2 23 22  BUN 19 18  CREATININE 0.96 0.97  GLUCOSE 119* 124*  CALCIUM 9.2 9.0   Recent Labs    06/21/18 1323  INR 1.1    Neurologically intact,foley in place No results found.  Assessment/Plan:    Plan:    Flomax per Dr. Gloriann Loan [facility] and f/U in 2 wks at [facility]. He will go home with foley catheter. All Cultures neg so far.  Heme/Onc consult about further testing for L3 vertebral body lesion. Discussed with pt may or may not need HH IV ABX.  Will plan on him going home to his daughter's house after my discussion with her.   Physical therapy mobilization.   [name] 06/24/2018, 9:52 AM

## 2018-06-24 NOTE — Consult Note (Addendum)
[place]  Telephone:(336) [phone] Fax:(336) [phone]   MEDICAL ONCOLOGY - INITIAL CONSULTATION  Referral MD: Dr. Rodell Perna  Reason for Referral: L3 vertebral body lesion  HPI:  This is an 85 year old male with a past medical history including hypertension, depression and anxiety, coronary artery disease, chronic interstitial cystitis, previous left hip fracture, previous cervical fusion, hyperlipidemia, restless leg syndrome, diabetes, arthritis, alpha 1 antitrypsin deficiency carrier, anemia, chronic back pain who was admitted for suspected postoperative lumbar decompression at L3-4 infection.  Surgery date was 05/10/2018.  The patient had severe stenosis at L3-4, multifactorial by MRI on 03/31/2018 and neurogenic claudication symptoms.  Patient underwent surgery and postoperatively had no problems with fever or wound drainage.  He had persistent problems with pain and had been taking pain medication for several weeks prior to his surgical decompression. Operative findings showed severe stenosis and postoperatively had improvement in his leg symptoms.    The 3 weeks prior to admission, the patient had problems with weight loss and poor p.o. intake.  Postop MRI scan was obtained that showed fluid collection multiloculated and area and L3 vertebral body suggestive of osteomyelitis.  MRI scan 03/31/2018 showed no abnormality of the L3 vertebral body.  The patient was seen by orthopedics on 06/04/2018 with back pain that radiated to his coccyx.  His pain is increased as his narcotic pain medication had been decreased.  He had past history of difficulty getting off narcotic medication in the distant past.  MRI scan was ordered obtained and PICC line was placed for IV antibiotics. Interventional radiology performed aspiration with report showing serous bloody fluid.  Fluid has been sent for cultures with no growth to date.  When seen today, the patient reports that he has had ongoing back pain  which is not much better.  He still has some difficulty moving his lower extremities.  He is able to move his left lower extremity better than the right.  The patient states that he thought he was recovering well from his surgery initially but then developed worsening pain and lower extremity weakness about 3 weeks following the surgery.  He denied having any fevers or chills.  He has not had any headaches or visual changes.  No dizziness.  He reports that he did not have any decreased appetite or weight loss prior to his surgery.  He currently has a poor appetite and has lost weight since the surgery.  Denies chest discomfort and shortness of breath.  Denies abdominal pain, nausea, vomiting, constipation, diarrhea.  Denies bleeding.  He denies having any pain other than the back pain.  He has been having difficulty with urinary retention within the past few days.  He has a Foley catheter in place.  We were asked to see the patient for further recommendations regarding his L3 vertebral body lesion.    Past Medical History:  Diagnosis Date  . Adenomatous colon polyp   . Alpha-1-antitrypsin deficiency ([place])   . Alpha-1-antitrypsin deficiency carrier   . Anemia   . Anxiety   . Arthritis    "knees; bad in my back" (09/01/2014)  . Bruises easily   . Chronic bronchitis ([place])    "get it pretty much q yr" (09/01/2014)  . Chronic lower back pain   . Coronary artery disease   . Depression    "related to health" (09/01/2014)  . Diarrhea    has had a part of colon removed  . Diverticulitis   . Diverticulosis   . Dyspnea  on exertion   . Dysrhythmia    bradycardia in the 30's (01/11/2012).. Afib briefly after CABG  . Esophageal dysmotility   . Esophageal stricture   . Gastritis   . GERD (gastroesophageal reflux disease)    takes Pantoprazole daily  . Gout   . Hemorrhoids   . Hiatal hernia   . History of colon polyps   . Hyperlipidemia    doesn't require meds  . Hypertension    takes Losartan  daily; current not taking   . IC (interstitial cystitis)   . Insomnia    takes Nortrypyline nightly  . Joint pain   . Joint swelling   . Macular hole of both eyes   . Nocturia   . OSA (obstructive sleep apnea)    "mild; tried CPAP; couldn't tolerate" (09/01/2014)  . Paroxysmal atrial fibrillation ([facility])   . Restless leg syndrome   . Sciatic nerve pain   . Type II diabetes mellitus ([place])    "borderline"  . Urinary frequency   :  Past Surgical History:  Procedure Laterality Date  . [place] VITRECTOMY WITH 20 GAUGE MVR PORT FOR MACULAR HOLE Right 10/08/2012   Procedure: 25 GAUGE PARS PLANA VITRECTOMY WITH 20 GAUGE MVR PORT FOR MACULAR HOLE; Membrame peel, serum patch; Laser treatment ; Gas exchange;  Surgeon: Hayden Pedro, MD;  Location: [facility];  Service: Ophthalmology;  Laterality: Right;  . 25 GAUGE PARS PLANA VITRECTOMY WITH 20 GAUGE MVR PORT FOR MACULAR HOLE Left 09/01/2014   Procedure: 25 GAUGE PARS PLANA VITRECTOMY WITH 20 GAUGE MVR PORT FOR MACULAR HOLE;  Surgeon: Hayden Pedro, MD;  Location: [facility];  Service: Ophthalmology;  Laterality: Left;  . ANTERIOR CERVICAL DECOMP/DISCECTOMY FUSION N/A 12/20/2015   Procedure: C5-6 Anterior Cervical Discectomy and Fusion, Allograft, and Plate;  Surgeon: [name], MD;  Location: [facility];  Service: Orthopedics;  Laterality: N/A;  . APPENDECTOMY  2009  . bladder lesion removed    . CARDIAC CATHETERIZATION  01/11/2012  . CATARACT EXTRACTION W/ INTRAOCULAR LENS  IMPLANT, BILATERAL Bilateral 2011  . CHOLECYSTECTOMY  2009  . COLONOSCOPY    . COMPRESSION HIP SCREW Left 04/04/2014   Procedure: COMPRESSION HIP LEFT;  Surgeon: Mcarthur Rossetti, MD;  Location: [facility];  Service: Orthopedics;  Laterality: Left;  . CORONARY ANGIOPLASTY    . CORONARY ARTERY BYPASS GRAFT  01/18/2012   Procedure: CORONARY ARTERY BYPASS GRAFTING (CABG);  Surgeon: Ivin Poot, MD;  Location: [facility];  Service: Open Heart Surgery;  Laterality: N/A;  times  four, using left internal mammary artery  . CYSTOSCOPY WITH URETEROSCOPY  2008; 2009   "painted inside of bladder wall"  . ELBOW SURGERY Right 1988   "reconstructive OR"  . ENDOVEIN HARVEST OF GREATER SAPHENOUS VEIN  01/18/2012   Procedure: ENDOVEIN HARVEST OF GREATER SAPHENOUS VEIN;  Surgeon: Ivin Poot, MD;  Location: [facility];  Service: Open Heart Surgery;  Laterality: Right;  . ESOPHAGOGASTRODUODENOSCOPY    . ESOPHAGOGASTRODUODENOSCOPY N/A 06/05/2014   Procedure: ESOPHAGOGASTRODUODENOSCOPY (EGD);  Surgeon: Irene Shipper, MD;  Location: [facility];  Service: Endoscopy;  Laterality: N/A;  . EXCISION/RELEASE BURSA HIP Left 07/30/2015   Procedure: EXCISION/RELEASE BURSA HIP;  Surgeon: Mcarthur Rossetti, MD;  Location: [facility];  Service: Orthopedics;  Laterality: Left;  . EXPLORATORY LAPAROTOMY  2009   with right colectomy  . FRACTURE SURGERY    . GAS INSERTION Left 09/01/2014   Procedure: INSERTION OF GAS;  Surgeon: Chrystie Nose  Zigmund Daniel, MD;  Location: [facility];  Service: Ophthalmology;  Laterality: Left;  C3F8  . GAS/FLUID EXCHANGE Left 09/01/2014   Procedure: GAS/FLUID EXCHANGE;  Surgeon: Hayden Pedro, MD;  Location: [facility];  Service: Ophthalmology;  Laterality: Left;  . HARDWARE REMOVAL Left 07/30/2015   Procedure: attempted LEFT HIP HARDWARE REMOVAL, LEFT HIP BURSECTOMY;  Surgeon: Mcarthur Rossetti, MD;  Location: [facility];  Service: Orthopedics;  Laterality: Left;  . HEEL SPUR EXCISION Bilateral 1970's  . INTERTROCHANTERIC HIP FRACTURE SURGERY Left 04/04/2014  . IR US GUIDE BX ASP/DRAIN  06/21/2018  . JOINT REPLACEMENT    . LEFT HEART CATH AND CORS/GRAFTS ANGIOGRAPHY N/A 05/28/2017   Procedure: LEFT HEART CATH AND CORS/GRAFTS ANGIOGRAPHY;  Surgeon: Belva Crome, MD;  Location: [facility];  Service: Cardiovascular;  Laterality: N/A;  . LEFT HEART CATHETERIZATION WITH CORONARY ANGIOGRAM N/A 01/11/2012   Procedure: LEFT HEART CATHETERIZATION WITH CORONARY ANGIOGRAM;  Surgeon: Leonie Man, MD;  Location: [facility];  Service: Cardiovascular;  Laterality: N/A;  . LUMBAR [place] SURGERY  ?2010   "cleaned out the stenosis" (01/11/2012)  . LUMBAR LAMINECTOMY/DECOMPRESSION MICRODISCECTOMY N/A 05/10/2018   Procedure: redo lumbar decompression L3-4;  Surgeon: [name], MD;  Location: [facility];  Service: Orthopedics;  Laterality: N/A;  . MEMBRANE PEEL Left 09/01/2014   Procedure: MEMBRANE PEEL;  Surgeon: Hayden Pedro, MD;  Location: [facility];  Service: Ophthalmology;  Laterality: Left;  . NM MYOCAR MULTIPLE W/SPECT  04/09/2012   NORMAL STRESS NUCLEAR STUDY. EF 51%.  [name] PARS PLANA VITRECTOMY W/ REPAIR OF MACULAR HOLE Left 09/01/2014  . PARTIAL COLECTOMY     d/t diverticulosis  . PHOTOCOAGULATION WITH LASER Left 09/01/2014   Procedure: PHOTOCOAGULATION WITH LASER;  Surgeon: Hayden Pedro, MD;  Location: [facility];  Service: Ophthalmology;  Laterality: Left;  HEADSCOPE LASER  . RECONSTRUCTION MEDIAL COLLATERAL LIGAMENT ELBOW W/ TENDON GRAFT Right 1988 X 2  . SERUM PATCH Left 09/01/2014   Procedure: SERUM PATCH;  Surgeon: Hayden Pedro, MD;  Location: [facility];  Service: Ophthalmology;  Laterality: Left;  . TONSILLECTOMY AND ADENOIDECTOMY  ~1943  . TOTAL KNEE ARTHROPLASTY Bilateral ~ 2001; 2011   right; left  . TRANSTHORACIC ECHOCARDIOGRAM  01/04/2012   MODERATE SEVERE-SEVERE LVH. EF=>55%. MILDLY IMPAIRED LV RELAXATION. LA IS MODERATE TO SEVERE DILATED. MODERATE MR. MV LEAFLETS APPEAR THICHENED.  [name] TUMOR EXCISION Left 12/2011   arm  :  Current Facility-Administered Medications  Medication Dose Route Frequency Provider Last Rate Last Dose  . 0.45 % sodium chloride infusion   Intravenous Continuous [name], MD 75 mL/hr at 06/24/18 [phone]    . cefTRIAXone (ROCEPHIN) 2 g in sodium chloride 0.9 % 100 mL IVPB  2 g Intravenous Q24H Thayer Headings, MD   Stopped at 06/24/18 1253  . docusate sodium (COLACE) capsule 100 mg  100 mg Oral BID Lanae Crumbly, PA-C   100 mg at 06/24/18 1021   . feeding supplement (ENSURE ENLIVE) (ENSURE ENLIVE) liquid 237 mL  237 mL Oral BID BM [name], MD   237 mL at 06/24/18 1021  . insulin aspart (novoLOG) injection 0-15 Units  0-15 Units Subcutaneous TID WC Lanae Crumbly, PA-C   3 Units at 06/24/18 1225  . menthol-cetylpyridinium (CEPACOL) lozenge 3 mg  1 lozenge Oral PRN [name], MD      . multivitamin with minerals tablet 1 tablet  1 tablet Oral Daily [name], MD  1 tablet at 06/24/18 1021  . ondansetron (ZOFRAN) tablet 4 mg  4 mg Oral Q6H PRN Lanae Crumbly, PA-C       Or  . ondansetron [facility]) injection 4 mg  4 mg Intravenous Q6H PRN Lanae Crumbly, PA-C      . oxyCODONE (Oxy IR/ROXICODONE) immediate release tablet 10 mg  10 mg Oral Q4H PRN [name], MD   10 mg at 06/24/18 1021  . polyethylene glycol (MIRALAX / GLYCOLAX) packet 17 g  17 g Oral Daily PRN Lanae Crumbly, PA-C      . sodium chloride flush (NS) 0.9 % injection 10-40 mL  10-40 mL Intracatheter Q12H [name], MD   10 mL at 06/23/18 1212  . sodium chloride flush (NS) 0.9 % injection 10-40 mL  10-40 mL Intracatheter PRN [name], MD      . sodium phosphate (FLEET) 7-19 GM/118ML enema 1 enema  1 enema Rectal Once PRN Lanae Crumbly, PA-C      . tamsulosin [facility]) capsule 0.4 mg  0.4 mg Oral Daily [name], MD   0.4 mg at 06/24/18 1021  . vancomycin (VANCOCIN) 1,250 mg in sodium chloride 0.9 % 250 mL IVPB  1,250 mg Intravenous Q12H [name], MD 166.7 mL/hr at 06/24/18 9030       Allergies  Allergen Reactions  . Morphine Itching  :  Family History  Problem Relation Age of Onset  . Emphysema Mother   . Kidney disease Father   . Emphysema Maternal Grandfather   . Colon cancer Neg Hx   :  Social History   Socioeconomic History  . Marital status: Divorced    Spouse name: Not on file  . Number of children: 2  . Years of education: Not on file  . Highest education level: Doctorate  Occupational History  . Occupation: Retired     Fish farm manager: RETIRED  Social Needs  . Financial resource strain: Not hard at all  . Food insecurity:    Worry: Never true    Inability: Never true  . Transportation needs:    Medical: No    Non-medical: No  Tobacco Use  . Smoking status: Never Smoker  . Smokeless tobacco: Never Used  Substance and Sexual Activity  . Alcohol use: Yes    Comment: 09/01/2014 "glass of wine a few times/yr; if that"  . Drug use: No    Comment: CDD cream for pain to hip tried recently   . Sexual activity: Yes    Birth control/protection: None  Lifestyle  . Physical activity:    Days per week: Patient refused    Minutes per session: Patient refused  . Stress: Patient refused  Relationships  . Social connections:    Talks on phone: Patient refused    Gets together: Patient refused    Attends religious service: Patient refused    Active member of club or organization: Patient refused    Attends meetings of clubs or organizations: Patient refused    Relationship status: Patient refused  . Intimate partner violence:    Fear of current or ex partner: Patient refused    Emotionally abused: Patient refused    Physically abused: Patient refused    Forced sexual activity: Patient refused  Other Topics Concern  . Not on file  Social History Narrative  . Not on file  :  Constitutional: negative except for anorexia, weight loss and developed after his surgery.  Eyes: negative Ears,  nose, mouth, throat, and face: negative Respiratory: negative Cardiovascular: negative Gastrointestinal: negative Genitourinary:negative except for urinary retention Integument/breast: negative Hematologic/lymphatic: negative Musculoskeletal:negative except for back pain and lower extremity weakness Neurological: negative Behavioral/Psych: negative Endocrine: negative  Exam: Patient Vitals for the past 24 hrs:  BP Temp Temp src Pulse Resp SpO2  06/24/18 0526 136/79 97.9 F (36.6 C) Oral 92 17 95 %    General:   well-nourished in no acute distress.  Eyes:  no scleral icterus.  ENT:  There were no oropharyngeal lesions.  Neck was without thyromegaly.  Lymphatics:  Negative cervical, supraclavicular or axillary adenopathy.  Respiratory: lungs were clear bilaterally without wheezing or crackles.  Cardiovascular:  Regular rate and rhythm, S1/S2, without murmur, rub or gallop.  There was trace pedal edema.  GI:  abdomen was soft, flat, nontender, nondistended, without organomegaly.  Muscoloskeletal: Decreased motor strength to the bilateral lower extremities. Left leg stronger than right.  Skin exam was without echymosis, petichae.  Neuro exam was nonfocal.  Patient was alert and oriented.  Attention was good.   Language was appropriate.  Mood was normal without depression.  Speech was not pressured.  Thought content was not tangential.     Lab Results  Component Value Date   WBC 6.5 06/22/2018   HGB 10.7 (L) 06/22/2018   HCT 33.4 (L) 06/22/2018   PLT 225 06/22/2018   GLUCOSE 124 (H) 06/22/2018   CHOL 89 04/04/2014   TRIG 214 (H) 04/04/2014   HDL 23 (L) 04/04/2014   LDLCALC 23 04/04/2014   ALT 43 06/21/2018   AST 43 (H) 06/21/2018   NA 137 06/22/2018   K 4.0 06/22/2018   CL 107 06/22/2018   CREATININE 0.97 06/22/2018   BUN 18 06/22/2018   CO2 22 06/22/2018    Mr Lumbar Spine W/o Contrast  Addendum Date: 06/21/2018   ADDENDUM REPORT: 06/21/2018 13:25 ADDENDUM: The new abnormal signal in the posterior L3 vertebral body could also represent a small infarct. Correlation with clinical symptoms and inflammatory markers is still recommended to exclude osteomyelitis. Electronically Signed   By: Titus Dubin M.D.   On: 06/21/2018 13:25   Result Date: 06/21/2018 CLINICAL DATA:  Low back pain radiating to the right buttock and leg to the knee for the past 3 months. Bilateral leg numbness and weakness. Recent surgery in February. EXAM: MRI LUMBAR SPINE WITHOUT CONTRAST TECHNIQUE: Multiplanar, multisequence MR  imaging of the lumbar spine was performed. No intravenous contrast was administered. COMPARISON:  MRI lumbar spine dated March 31, 2018. FINDINGS: Segmentation:  Unchanged partial sacralization of L5 on the left. Alignment:  Unchanged 4 mm anterolisthesis at L4-L5. Vertebrae: No fracture. New focal, rounded 2.3 cm area of slightly T2 hypointense, T1 hypointense, slightly stir hyperintense marrow signal in the posterior L3 vertebral body with a curvilinear rim of T2/STIR hyperintensity. Conus medullaris and cauda equina: Conus extends to the T12-L1 level. Conus and cauda equina appear normal. Paraspinal and other soft tissues: Paraspinous postsurgical changes centered at L3-L4 with loculated 2.8 x 1.4 x 7.9 cm fluid collection. No epidural fluid collection. Disc levels: T12-L1: Unchanged mild disc bulging and mild bilateral facet arthropathy. Unchanged mild right neuroforaminal stenosis. No spinal canal or left neuroforaminal stenosis. L1-L2: Unchanged small shallow broad-based posterior disc protrusion and mild bilateral facet arthropathy with ligamentum flavum hypertrophy resulting and moderate spinal canal and mild bilateral neuroforaminal stenosis. L2-L3: Prior posterior decompression. Unchanged small shallow broad-based posterior disc protrusion and mild bilateral facet arthropathy. Unchanged moderate bilateral lateral recess  and neuroforaminal stenosis. No spinal canal stenosis. L3-L4: Prior posterior decompression with interval bilateral partial facetectomy. Unchanged small broad-based posterior disc protrusion and severe bilateral facet arthropathy. Resolved spinal canal stenosis. Residual mild bilateral lateral recess stenosis and moderate bilateral neuroforaminal stenosis. L4-L5: Unchanged mild disc uncovering and disc bulging with prior bilateral facet joint ankylosis. Unchanged mild bilateral lateral recess and neuroforaminal stenosis. No spinal canal stenosis. L5-S1: Negative disc. Unchanged moderate  right and mild left facet arthropathy. No stenosis. IMPRESSION: 1. New focal, rounded 2.3 cm area of abnormal signal in the posterior L3 vertebral body, most concerning for osteomyelitis given recent surgery. The appearance could be seen with a metastatic lesion such as prostate cancer, although this is thought to be less likely given the normal marrow signal 3 months ago. 2. Interval repeat posterior decompression with new bilateral partial facetectomies at L3-L4. Resolved spinal canal stenosis. There is residual mild bilateral lateral recess stenosis and moderate bilateral neuroforaminal stenosis. 3. Unchanged moderate spinal canal stenosis at L1-L2. 4. Prior remote posterior decompression at L2-L3 with unchanged moderate bilateral lateral recess and neuroforaminal stenosis. 5. Paraspinous postsurgical changes centered at L3-L4 with loculated 2.8 x 1.4 x 7.9 cm fluid collection, likely a seroma. These results will be called to the ordering clinician or representative by the Radiologist Assistant, and communication documented in the PACS or zVision Dashboard. Electronically Signed: By: Titus Dubin M.D. On: 06/21/2018 10:10   Ir US Guide Bx Asp/drain  Result Date: 06/21/2018 INDICATION: Indeterminate fluid collection within the midline of the low back. Please perform ultrasound-guided aspiration for diagnostic purposes. Poor venous access. Please perform PICC line placement for long-term antibiotic administration. EXAM: 1. ULTRASOUND AND FLUOROSCOPIC GUIDED PICC LINE INSERTION 2. ULTRASOUND GUIDED ASPIRATION ILL-DEFINED FLUID COLLECTION WITHIN MIDLINE OF LOW BACK COMPARISON:  Lumbar spine MRI - 06/20/2018 MEDICATIONS: None. Anesthesia/sedation: Moderate (conscious) sedation was employed during this procedure. A total of Fentanyl 50 mcg and Dilaudid 1 mg was administered intravenously. Moderate Sedation Time: 33 minutes. The patient's level of consciousness and vital signs were monitored continuously by  radiology nursing throughout the procedure under my direct supervision. CONTRAST:  None FLUOROSCOPY TIME:  36 seconds (2.3 mGy) COMPLICATIONS: None immediate. TECHNIQUE: The procedure, risks, benefits, and alternatives were explained to the patient and informed written consent was obtained. A timeout was performed prior to the initiation of the procedure. The right upper extremity was prepped with chlorhexidine in a sterile fashion, and a sterile drape was applied covering the operative field. Maximum barrier sterile technique with sterile gowns and gloves were used for the procedure. A timeout was performed prior to the initiation of the procedure. Local anesthesia was provided with 1% lidocaine. Under direct ultrasound guidance, the basilic vein was accessed with a micropuncture kit after the overlying soft tissues were anesthetized with 1% lidocaine. After the overlying soft tissues were anesthetized, a small venotomy incision was created and a micropuncture kit was utilized to access the right basilic vein. Real-time ultrasound guidance was utilized for vascular access including the acquisition of a permanent ultrasound image documenting patency of the accessed vessel. A guidewire was advanced to the level of the superior caval-atrial junction for measurement purposes and the PICC line was cut to length. A peel-away sheath was placed and a 40 cm, 5 [place], dual lumen was inserted to level of the superior caval-atrial junction. A post procedure spot fluoroscopic was obtained. The catheter easily aspirated and flushed and was sutured in place. A dressing was placed. _________________________________________________________ Attention was now paid  towards aspiration of the ill-defined fluid collection within the midline of the low back demonstrated on preceding lumbar spine MRI. Sonographic evaluation of the soft tissues subjacent to the well-healed surgical incision within the midline of the low back demonstrated  an ill-defined fluid collection correlating with ill-defined fluid collection demonstrated on preceding lumbar spine MRI As such, the overlying soft tissues were prepped and draped in usual sterile fashion. After the overlying soft tissues were anesthetized with 1% lidocaine, an 18 gauge trocar needle was advanced into the dominant ill-defined component of the collection. An ultrasound image was saved for procedural documentation purposes Next, approximately 2 cc of serous, slightly blood tinged fluid was aspirated as the needle was slowly retracted. All aspirated fluid was capped and sent to the laboratory for analysis. A dressing was placed. The patient tolerated the above procedures well without immediate postprocedural complication. FINDINGS: After catheter placement, the tip lies within the superior cavoatrial junction. The catheter aspirates and flushes normally and is ready for immediate use. Technically successful ultrasound-guided aspiration of 2 cc of serous, slightly blood tinged fluid from the ill-defined serpiginous fluid collection within the midline of the low back, subjacent to the well-healed midline surgical incision, correlating with the ill-defined fluid collection demonstrated on preceding lumbar spine MRI. IMPRESSION: 1. Successful ultrasound and fluoroscopic guided placement of a right basilic vein approach, 40 cm, 5 French, dual lumen PICC with tip at the superior caval-atrial junction. The PICC line is ready for immediate use. 2. Successful ultrasound-guided aspiration of approximately 2 cc of serous, slightly blood tinged fluid from the ill-defined fluid collection within the midline of the low back. All aspirated fluid was capped and sent to the laboratory for analysis. Note, the fluid collection is currently too small to allow for percutaneous drainage catheter placement. Electronically Signed   By: Sandi Mariscal M.D.   On: 06/21/2018 15:30   Portable Chest 1 View  Result Date:  06/21/2018 CLINICAL DATA:  Preoperative study.  Recent PICC line placement. EXAM: PORTABLE CHEST 1 VIEW COMPARISON:  Chest x-ray dated May 03, 2018. FINDINGS: Interval placement of a right upper extremity PICC line with the tip at the cavoatrial junction. The heart size and mediastinal contours are within normal limits. Prior CABG. Atherosclerotic calcification of the aortic arch. Normal pulmonary vascularity. No focal consolidation, pleural effusion, or pneumothorax. Unchanged calcified granuloma in the right upper lobe. No acute osseous abnormality. Unchanged moderate to large hiatal hernia. IMPRESSION: No active disease. Electronically Signed   By: Titus Dubin M.D.   On: 06/21/2018 16:58   Ir Picc Placement Right >5 Yrs Inc Img Guide  Result Date: 06/21/2018 INDICATION: Indeterminate fluid collection within the midline of the low back. Please perform ultrasound-guided aspiration for diagnostic purposes. Poor venous access. Please perform PICC line placement for long-term antibiotic administration. EXAM: 1. ULTRASOUND AND FLUOROSCOPIC GUIDED PICC LINE INSERTION 2. ULTRASOUND GUIDED ASPIRATION ILL-DEFINED FLUID COLLECTION WITHIN MIDLINE OF LOW BACK COMPARISON:  Lumbar spine MRI - 06/20/2018 MEDICATIONS: None. Anesthesia/sedation: Moderate (conscious) sedation was employed during this procedure. A total of Fentanyl 50 mcg and Dilaudid 1 mg was administered intravenously. Moderate Sedation Time: 33 minutes. The patient's level of consciousness and vital signs were monitored continuously by radiology nursing throughout the procedure under my direct supervision. CONTRAST:  None FLUOROSCOPY TIME:  36 seconds (2.3 mGy) COMPLICATIONS: None immediate. TECHNIQUE: The procedure, risks, benefits, and alternatives were explained to the patient and informed written consent was obtained. A timeout was performed prior to the initiation of the  procedure. The right upper extremity was prepped with chlorhexidine in a  sterile fashion, and a sterile drape was applied covering the operative field. Maximum barrier sterile technique with sterile gowns and gloves were used for the procedure. A timeout was performed prior to the initiation of the procedure. Local anesthesia was provided with 1% lidocaine. Under direct ultrasound guidance, the basilic vein was accessed with a micropuncture kit after the overlying soft tissues were anesthetized with 1% lidocaine. After the overlying soft tissues were anesthetized, a small venotomy incision was created and a micropuncture kit was utilized to access the right basilic vein. Real-time ultrasound guidance was utilized for vascular access including the acquisition of a permanent ultrasound image documenting patency of the accessed vessel. A guidewire was advanced to the level of the superior caval-atrial junction for measurement purposes and the PICC line was cut to length. A peel-away sheath was placed and a 40 cm, 5 [place], dual lumen was inserted to level of the superior caval-atrial junction. A post procedure spot fluoroscopic was obtained. The catheter easily aspirated and flushed and was sutured in place. A dressing was placed. _________________________________________________________ Attention was now paid towards aspiration of the ill-defined fluid collection within the midline of the low back demonstrated on preceding lumbar spine MRI. Sonographic evaluation of the soft tissues subjacent to the well-healed surgical incision within the midline of the low back demonstrated an ill-defined fluid collection correlating with ill-defined fluid collection demonstrated on preceding lumbar spine MRI As such, the overlying soft tissues were prepped and draped in usual sterile fashion. After the overlying soft tissues were anesthetized with 1% lidocaine, an 18 gauge trocar needle was advanced into the dominant ill-defined component of the collection. An ultrasound image was saved for procedural  documentation purposes Next, approximately 2 cc of serous, slightly blood tinged fluid was aspirated as the needle was slowly retracted. All aspirated fluid was capped and sent to the laboratory for analysis. A dressing was placed. The patient tolerated the above procedures well without immediate postprocedural complication. FINDINGS: After catheter placement, the tip lies within the superior cavoatrial junction. The catheter aspirates and flushes normally and is ready for immediate use. Technically successful ultrasound-guided aspiration of 2 cc of serous, slightly blood tinged fluid from the ill-defined serpiginous fluid collection within the midline of the low back, subjacent to the well-healed midline surgical incision, correlating with the ill-defined fluid collection demonstrated on preceding lumbar spine MRI. IMPRESSION: 1. Successful ultrasound and fluoroscopic guided placement of a right basilic vein approach, 40 cm, 5 [place], dual lumen PICC with tip at the superior caval-atrial junction. The PICC line is ready for immediate use. 2. Successful ultrasound-guided aspiration of approximately 2 cc of serous, slightly blood tinged fluid from the ill-defined fluid collection within the midline of the low back. All aspirated fluid was capped and sent to the laboratory for analysis. Note, the fluid collection is currently too small to allow for percutaneous drainage catheter placement. Electronically Signed   By: Sandi Mariscal M.D.   On: 06/21/2018 15:30   Mr Lumbar Spine W/o Contrast  Addendum Date: 06/21/2018   ADDENDUM REPORT: 06/21/2018 13:25 ADDENDUM: The new abnormal signal in the posterior L3 vertebral body could also represent a small infarct. Correlation with clinical symptoms and inflammatory markers is still recommended to exclude osteomyelitis. Electronically Signed   By: Titus Dubin M.D.   On: 06/21/2018 13:25   Result Date: 06/21/2018 CLINICAL DATA:  Low back pain radiating to the right buttock  and leg to  the knee for the past 3 months. Bilateral leg numbness and weakness. Recent surgery in February. EXAM: MRI LUMBAR SPINE WITHOUT CONTRAST TECHNIQUE: Multiplanar, multisequence MR imaging of the lumbar spine was performed. No intravenous contrast was administered. COMPARISON:  MRI lumbar spine dated March 31, 2018. FINDINGS: Segmentation:  Unchanged partial sacralization of L5 on the left. Alignment:  Unchanged 4 mm anterolisthesis at L4-L5. Vertebrae: No fracture. New focal, rounded 2.3 cm area of slightly T2 hypointense, T1 hypointense, slightly stir hyperintense marrow signal in the posterior L3 vertebral body with a curvilinear rim of T2/STIR hyperintensity. Conus medullaris and cauda equina: Conus extends to the T12-L1 level. Conus and cauda equina appear normal. Paraspinal and other soft tissues: Paraspinous postsurgical changes centered at L3-L4 with loculated 2.8 x 1.4 x 7.9 cm fluid collection. No epidural fluid collection. Disc levels: T12-L1: Unchanged mild disc bulging and mild bilateral facet arthropathy. Unchanged mild right neuroforaminal stenosis. No spinal canal or left neuroforaminal stenosis. L1-L2: Unchanged small shallow broad-based posterior disc protrusion and mild bilateral facet arthropathy with ligamentum flavum hypertrophy resulting and moderate spinal canal and mild bilateral neuroforaminal stenosis. L2-L3: Prior posterior decompression. Unchanged small shallow broad-based posterior disc protrusion and mild bilateral facet arthropathy. Unchanged moderate bilateral lateral recess and neuroforaminal stenosis. No spinal canal stenosis. L3-L4: Prior posterior decompression with interval bilateral partial facetectomy. Unchanged small broad-based posterior disc protrusion and severe bilateral facet arthropathy. Resolved spinal canal stenosis. Residual mild bilateral lateral recess stenosis and moderate bilateral neuroforaminal stenosis. L4-L5: Unchanged mild disc uncovering and disc  bulging with prior bilateral facet joint ankylosis. Unchanged mild bilateral lateral recess and neuroforaminal stenosis. No spinal canal stenosis. L5-S1: Negative disc. Unchanged moderate right and mild left facet arthropathy. No stenosis. IMPRESSION: 1. New focal, rounded 2.3 cm area of abnormal signal in the posterior L3 vertebral body, most concerning for osteomyelitis given recent surgery. The appearance could be seen with a metastatic lesion such as prostate cancer, although this is thought to be less likely given the normal marrow signal 3 months ago. 2. Interval repeat posterior decompression with new bilateral partial facetectomies at L3-L4. Resolved spinal canal stenosis. There is residual mild bilateral lateral recess stenosis and moderate bilateral neuroforaminal stenosis. 3. Unchanged moderate spinal canal stenosis at L1-L2. 4. Prior remote posterior decompression at L2-L3 with unchanged moderate bilateral lateral recess and neuroforaminal stenosis. 5. Paraspinous postsurgical changes centered at L3-L4 with loculated 2.8 x 1.4 x 7.9 cm fluid collection, likely a seroma. These results will be called to the ordering clinician or representative by the Radiologist Assistant, and communication documented in the PACS or zVision Dashboard. Electronically Signed: By: Titus Dubin M.D. On: 06/21/2018 10:10   Ir US Guide Bx Asp/drain  Result Date: 06/21/2018 INDICATION: Indeterminate fluid collection within the midline of the low back. Please perform ultrasound-guided aspiration for diagnostic purposes. Poor venous access. Please perform PICC line placement for long-term antibiotic administration. EXAM: 1. ULTRASOUND AND FLUOROSCOPIC GUIDED PICC LINE INSERTION 2. ULTRASOUND GUIDED ASPIRATION ILL-DEFINED FLUID COLLECTION WITHIN MIDLINE OF LOW BACK COMPARISON:  Lumbar spine MRI - 06/20/2018 MEDICATIONS: None. Anesthesia/sedation: Moderate (conscious) sedation was employed during this procedure. A total of  Fentanyl 50 mcg and Dilaudid 1 mg was administered intravenously. Moderate Sedation Time: 33 minutes. The patient's level of consciousness and vital signs were monitored continuously by radiology nursing throughout the procedure under my direct supervision. CONTRAST:  None FLUOROSCOPY TIME:  36 seconds (2.3 mGy) COMPLICATIONS: None immediate. TECHNIQUE: The procedure, risks, benefits, and alternatives were explained to the patient  and informed written consent was obtained. A timeout was performed prior to the initiation of the procedure. The right upper extremity was prepped with chlorhexidine in a sterile fashion, and a sterile drape was applied covering the operative field. Maximum barrier sterile technique with sterile gowns and gloves were used for the procedure. A timeout was performed prior to the initiation of the procedure. Local anesthesia was provided with 1% lidocaine. Under direct ultrasound guidance, the basilic vein was accessed with a micropuncture kit after the overlying soft tissues were anesthetized with 1% lidocaine. After the overlying soft tissues were anesthetized, a small venotomy incision was created and a micropuncture kit was utilized to access the right basilic vein. Real-time ultrasound guidance was utilized for vascular access including the acquisition of a permanent ultrasound image documenting patency of the accessed vessel. A guidewire was advanced to the level of the superior caval-atrial junction for measurement purposes and the PICC line was cut to length. A peel-away sheath was placed and a 40 cm, 5 [place], dual lumen was inserted to level of the superior caval-atrial junction. A post procedure spot fluoroscopic was obtained. The catheter easily aspirated and flushed and was sutured in place. A dressing was placed. _________________________________________________________ Attention was now paid towards aspiration of the ill-defined fluid collection within the midline of the low  back demonstrated on preceding lumbar spine MRI. Sonographic evaluation of the soft tissues subjacent to the well-healed surgical incision within the midline of the low back demonstrated an ill-defined fluid collection correlating with ill-defined fluid collection demonstrated on preceding lumbar spine MRI As such, the overlying soft tissues were prepped and draped in usual sterile fashion. After the overlying soft tissues were anesthetized with 1% lidocaine, an 18 gauge trocar needle was advanced into the dominant ill-defined component of the collection. An ultrasound image was saved for procedural documentation purposes Next, approximately 2 cc of serous, slightly blood tinged fluid was aspirated as the needle was slowly retracted. All aspirated fluid was capped and sent to the laboratory for analysis. A dressing was placed. The patient tolerated the above procedures well without immediate postprocedural complication. FINDINGS: After catheter placement, the tip lies within the superior cavoatrial junction. The catheter aspirates and flushes normally and is ready for immediate use. Technically successful ultrasound-guided aspiration of 2 cc of serous, slightly blood tinged fluid from the ill-defined serpiginous fluid collection within the midline of the low back, subjacent to the well-healed midline surgical incision, correlating with the ill-defined fluid collection demonstrated on preceding lumbar spine MRI. IMPRESSION: 1. Successful ultrasound and fluoroscopic guided placement of a right basilic vein approach, 40 cm, 5 French, dual lumen PICC with tip at the superior caval-atrial junction. The PICC line is ready for immediate use. 2. Successful ultrasound-guided aspiration of approximately 2 cc of serous, slightly blood tinged fluid from the ill-defined fluid collection within the midline of the low back. All aspirated fluid was capped and sent to the laboratory for analysis. Note, the fluid collection is  currently too small to allow for percutaneous drainage catheter placement. Electronically Signed   By: Sandi Mariscal M.D.   On: 06/21/2018 15:30   Portable Chest 1 View  Result Date: 06/21/2018 CLINICAL DATA:  Preoperative study.  Recent PICC line placement. EXAM: PORTABLE CHEST 1 VIEW COMPARISON:  Chest x-ray dated May 03, 2018. FINDINGS: Interval placement of a right upper extremity PICC line with the tip at the cavoatrial junction. The heart size and mediastinal contours are within normal limits. Prior CABG. Atherosclerotic calcification of  the aortic arch. Normal pulmonary vascularity. No focal consolidation, pleural effusion, or pneumothorax. Unchanged calcified granuloma in the right upper lobe. No acute osseous abnormality. Unchanged moderate to large hiatal hernia. IMPRESSION: No active disease. Electronically Signed   By: Titus Dubin M.D.   On: 06/21/2018 16:58   Ir Picc Placement Right >5 Yrs Inc Img Guide  Result Date: 06/21/2018 INDICATION: Indeterminate fluid collection within the midline of the low back. Please perform ultrasound-guided aspiration for diagnostic purposes. Poor venous access. Please perform PICC line placement for long-term antibiotic administration. EXAM: 1. ULTRASOUND AND FLUOROSCOPIC GUIDED PICC LINE INSERTION 2. ULTRASOUND GUIDED ASPIRATION ILL-DEFINED FLUID COLLECTION WITHIN MIDLINE OF LOW BACK COMPARISON:  Lumbar spine MRI - 06/20/2018 MEDICATIONS: None. Anesthesia/sedation: Moderate (conscious) sedation was employed during this procedure. A total of Fentanyl 50 mcg and Dilaudid 1 mg was administered intravenously. Moderate Sedation Time: 33 minutes. The patient's level of consciousness and vital signs were monitored continuously by radiology nursing throughout the procedure under my direct supervision. CONTRAST:  None FLUOROSCOPY TIME:  36 seconds (2.3 mGy) COMPLICATIONS: None immediate. TECHNIQUE: The procedure, risks, benefits, and alternatives were explained to  the patient and informed written consent was obtained. A timeout was performed prior to the initiation of the procedure. The right upper extremity was prepped with chlorhexidine in a sterile fashion, and a sterile drape was applied covering the operative field. Maximum barrier sterile technique with sterile gowns and gloves were used for the procedure. A timeout was performed prior to the initiation of the procedure. Local anesthesia was provided with 1% lidocaine. Under direct ultrasound guidance, the basilic vein was accessed with a micropuncture kit after the overlying soft tissues were anesthetized with 1% lidocaine. After the overlying soft tissues were anesthetized, a small venotomy incision was created and a micropuncture kit was utilized to access the right basilic vein. Real-time ultrasound guidance was utilized for vascular access including the acquisition of a permanent ultrasound image documenting patency of the accessed vessel. A guidewire was advanced to the level of the superior caval-atrial junction for measurement purposes and the PICC line was cut to length. A peel-away sheath was placed and a 40 cm, 5 [place], dual lumen was inserted to level of the superior caval-atrial junction. A post procedure spot fluoroscopic was obtained. The catheter easily aspirated and flushed and was sutured in place. A dressing was placed. _________________________________________________________ Attention was now paid towards aspiration of the ill-defined fluid collection within the midline of the low back demonstrated on preceding lumbar spine MRI. Sonographic evaluation of the soft tissues subjacent to the well-healed surgical incision within the midline of the low back demonstrated an ill-defined fluid collection correlating with ill-defined fluid collection demonstrated on preceding lumbar spine MRI As such, the overlying soft tissues were prepped and draped in usual sterile fashion. After the overlying soft  tissues were anesthetized with 1% lidocaine, an 18 gauge trocar needle was advanced into the dominant ill-defined component of the collection. An ultrasound image was saved for procedural documentation purposes Next, approximately 2 cc of serous, slightly blood tinged fluid was aspirated as the needle was slowly retracted. All aspirated fluid was capped and sent to the laboratory for analysis. A dressing was placed. The patient tolerated the above procedures well without immediate postprocedural complication. FINDINGS: After catheter placement, the tip lies within the superior cavoatrial junction. The catheter aspirates and flushes normally and is ready for immediate use. Technically successful ultrasound-guided aspiration of 2 cc of serous, slightly blood tinged fluid from the ill-defined  serpiginous fluid collection within the midline of the low back, subjacent to the well-healed midline surgical incision, correlating with the ill-defined fluid collection demonstrated on preceding lumbar spine MRI. IMPRESSION: 1. Successful ultrasound and fluoroscopic guided placement of a right basilic vein approach, 40 cm, 5 French, dual lumen PICC with tip at the superior caval-atrial junction. The PICC line is ready for immediate use. 2. Successful ultrasound-guided aspiration of approximately 2 cc of serous, slightly blood tinged fluid from the ill-defined fluid collection within the midline of the low back. All aspirated fluid was capped and sent to the laboratory for analysis. Note, the fluid collection is currently too small to allow for percutaneous drainage catheter placement. Electronically Signed   By: Sandi Mariscal M.D.   On: 06/21/2018 15:30    Assessment and Plan:   This is a pleasant 85 year old male who underwent recent bilateral decompression for recurrent stenosis at the L3-4 level.  Initially, the patient did well postoperatively but then started to develop weight loss and decreased oral intake.   Postoperative MRI showed a fluid collection that was multiloculated around L3 which was suggestive of osteomyelitis. Fluid has been drained and sent for culture. Culture negative to date. He is currently on IV antibiotics and being followed by infectious disease.  MRI also suggested this could represent a metastatic lesion such as prostate cancer but thought to be less likely given the normal marrow signal on the MRI 3 months prior.  A PSA was drawn today which was completely normal at 2.08. Additional work-up is currently pending including a CT of the abdomen and pelvis with contrast, a bone scan, and a myeloma panel. I have discussed the findings to date with the patient. Will await further results prior to making further recommendations.  Thank you for this referral.   Mikey Bussing, DNP, AGPCNP-BC, AOCNP  ADDENDUM: As always, KristIn's note is incredibly thorough.  What she did not know was the results of the CT scan.  It looks like we actually may have a problem.  He may have lesions in his liver.  Looks like the radiologist have pointed out 3 tumors in his liver.  More importantly, and what might be causing his right leg pain that there is a large mass next to his pelvis.  Clearly, this is going need to be the spot to biopsy.  However, I think if we are going to do any biopsies he must have an MRI of the abdomen and pelvis.  We really need to see what is going on in his liver.  We need to see what this large soft tissue mass is next to his right pelvis.  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  There is no history of cancer in the family.  He never smoked.  I talked him about the possibility of him having a malignancy.  Hopefully, we can get this work-up done with him in the hospital.  Hopefully, the MRI will be done tonight.  I will send off an alpha-fetoprotein and a CEA level.  I think this is going to be somewhat  complicated.  Again, the MRIs are going to be incredibly helpful.   Lattie Haw, MD [facility] 21:4-5

## 2018-06-24 NOTE — Progress Notes (Signed)
PT Cancellation Note  Patient Details Name: Lucas Morales MRN: [id number] DOB: [birth year]   Cancelled Treatment:    Reason Eval/Treat Not Completed: Pain limiting ability to participate Pt reports he is having increased pain and is having a CT later this afternoon. Requesting to wait until after CT. Will follow up as schedule allows.   Leighton Ruff, PT, DPT  Acute Rehabilitation Services  Pager: [phone] Office: [phone]    Rudean Hitt 06/24/2018, 11:35 AM

## 2018-06-25 ENCOUNTER — Inpatient Hospital Stay (HOSPITAL_COMMUNITY): Payer: Medicare Other

## 2018-06-25 LAB — MULTIPLE MYELOMA PANEL, SERUM
Albumin SerPl Elph-Mcnc: 2.9 g/dL (ref 2.9–4.4)
Albumin/Glob SerPl: 1.2 (ref 0.7–1.7)
Alpha 1: 0.2 g/dL (ref 0.0–0.4)
Alpha2 Glob SerPl Elph-Mcnc: 1.1 g/dL — ABNORMAL HIGH (ref 0.4–1.0)
B-Globulin SerPl Elph-Mcnc: 0.7 g/dL (ref 0.7–1.3)
Gamma Glob SerPl Elph-Mcnc: 0.4 g/dL (ref 0.4–1.8)
Globulin, Total: 2.5 g/dL (ref 2.2–3.9)
IgA: 101 mg/dL (ref 61–437)
IgG (Immunoglobin G), Serum: 615 mg/dL (ref 603–1613)
IgM (Immunoglobulin M), Srm: 67 mg/dL (ref 15–143)
Total Protein ELP: 5.4 g/dL — ABNORMAL LOW (ref 6.0–8.5)

## 2018-06-25 LAB — GLUCOSE, CAPILLARY
Glucose-Capillary: 127 mg/dL — ABNORMAL HIGH (ref 70–99)
Glucose-Capillary: 146 mg/dL — ABNORMAL HIGH (ref 70–99)
Glucose-Capillary: 119 mg/dL — ABNORMAL HIGH (ref 70–99)
Glucose-Capillary: 154 mg/dL — ABNORMAL HIGH (ref 70–99)
Glucose-Capillary: 159 mg/dL — ABNORMAL HIGH (ref 70–99)

## 2018-06-25 LAB — KAPPA/LAMBDA LIGHT CHAINS
Kappa free light chain: 16.3 mg/L (ref 3.3–19.4)
Kappa, lambda light chain ratio: 1.05 (ref 0.26–1.65)
Lambda free light chains: 15.5 mg/L (ref 5.7–26.3)

## 2018-06-25 LAB — AFP TUMOR MARKER: AFP, Serum, Tumor Marker: 39.7 ng/mL — ABNORMAL HIGH (ref 0.0–8.3)

## 2018-06-25 LAB — CEA: CEA: 2.2 ng/mL (ref 0.0–4.7)

## 2018-06-25 LAB — CREATININE, SERUM
Creatinine, Ser: 0.85 mg/dL (ref 0.61–1.24)
GFR calc Af Amer: >60 mL/min (ref >60)
GFR calc non Af Amer: >60 mL/min (ref >60)

## 2018-06-25 MED ORDER — HEPARIN (PORCINE) 25000 UT/250ML-% IV SOLN
1650.0000 [IU]/h | INTRAVENOUS | Status: DC
  Administered 2018-06-25 – 2018-06-26 (×3): 1650 [IU]/h via INTRAVENOUS
  Filled 2018-06-25 (×5): qty 250

## 2018-06-25 MED ORDER — HEPARIN BOLUS VIA INFUSION
5000.0000 [IU] | Freq: Once | INTRAVENOUS | Status: AC
  Administered 2018-06-25: 18:00:00 5000 [IU] via INTRAVENOUS
  Filled 2018-06-25: qty 5000

## 2018-06-25 MED ORDER — OXYCODONE HCL 5 MG PO TABS
10.0000 mg | ORAL_TABLET | ORAL | Status: DC | PRN
  Administered 2018-06-25: 15:00:00 10 mg via ORAL
  Administered 2018-06-25: 20:00:00 15 mg via ORAL
  Administered 2018-06-25: 10 mg via ORAL
  Administered 2018-06-26 – 2018-06-27 (×5): 15 mg via ORAL
  Filled 2018-06-25 (×5): qty 3
  Filled 2018-06-25: qty 2
  Filled 2018-06-25: qty 3
  Filled 2018-06-25 (×2): qty 2

## 2018-06-25 MED ORDER — TECHNETIUM TC 99M MEDRONATE IV KIT
20.0000 | PACK | Freq: Once | INTRAVENOUS | Status: AC | PRN
  Administered 2018-06-25: 20 via INTRAVENOUS

## 2018-06-25 MED ORDER — PRO-STAT SUGAR FREE PO LIQD
30.0000 mL | Freq: Two times a day (BID) | ORAL | Status: DC
  Administered 2018-06-27 – 2018-07-15 (×22): 30 mL via ORAL
  Filled 2018-06-25 (×23): qty 30

## 2018-06-25 MED ORDER — BOOST / RESOURCE BREEZE PO LIQD CUSTOM
1.0000 | Freq: Three times a day (TID) | ORAL | Status: DC
  Administered 2018-06-25 – 2018-06-28 (×4): 1 via ORAL

## 2018-06-25 MED ORDER — GADOBUTROL 1 MMOL/ML IV SOLN
10.0000 mL | Freq: Once | INTRAVENOUS | Status: AC | PRN
  Administered 2018-06-25: 10 mL via INTRAVENOUS

## 2018-06-25 NOTE — Progress Notes (Signed)
PT Cancellation Note  Patient Details Name: Lucas Morales MRN: [id number] DOB: 23-Feb-1934   Cancelled Treatment:    Reason Eval/Treat Not Completed: Pain limiting ability to participate. Pt deferred treatment this morning due to being NPO and awaiting scans. Pt unable to work with therapist in PM due to increased pain after scans. Has been able to get up to bathroom with nursing.   Leighton Roach, PT  Acute Rehab Services  Pager [phone] Office [place] 06/25/2018, 3:37 PM

## 2018-06-25 NOTE — Progress Notes (Signed)
Subjective:    Patient reports pain as moderate.    Objective: Vital signs in last 24 hours: Temp:  [98.3 F (36.8 C)-98.4 F (36.9 C)] 98.4 F (36.9 C) (04/07 0438) Pulse Rate:  [101-105] 105 (04/07 0438) Resp:  [17-19] 19 (04/07 0438) BP: (137-150)/(57-87) 139/87 (04/07 0438) SpO2:  [95 %-97 %] 97 % (04/07 0438)  Intake/Output from previous day: 04/06 0701 - 04/07 0700 In: 1046 [P.O.:120; I.V.:576.4; IV Piggyback:349.6] Out: 1560 [Urine:1560] Intake/Output this shift: No intake/output data recorded.  No results for input(s): HGB in the last 72 hours. No results for input(s): WBC, RBC, HCT, PLT in the last 72 hours. No results for input(s): NA, K, CL, CO2, BUN, CREATININE, GLUCOSE, CALCIUM in the last 72 hours. No results for input(s): LABPT, INR in the last 72 hours.  Neurologically intact Ct Abdomen Pelvis W Contrast  Result Date: 06/24/2018 CLINICAL DATA:  New L3 lesion on lumbar MRI, suspicious for metastatic disease. No known malignancy. EXAM: CT ABDOMEN AND PELVIS WITH CONTRAST TECHNIQUE: Multidetector CT imaging of the abdomen and pelvis was performed using the standard protocol following bolus administration of intravenous contrast. CONTRAST:  127mL OMNIPAQUE IOHEXOL 300 MG/ML  SOLN COMPARISON:  Abdominopelvic CT 04/30/2009.  Lumbar MRI 06/20/2018. FINDINGS: Lower chest: Trace right pleural effusion with mild bibasilar atelectasis. There is a moderate size hiatal hernia which has mildly enlarged compared with the prior CT. Aortic and coronary artery atherosclerosis noted. Hepatobiliary: There are no underlying morphologic changes of cirrhosis. There is new volume loss in the posterior segment of the right hepatic lobe with associated intrahepatic biliary dilatation. There is an ill-defined low-density mass centrally in the right hepatic lobe, measuring approximately 3.5 x 2.6 cm on image 16/3. There are possible additional lesions in the right hepatic lobe measuring 15 mm on  image 16/3 and 25/3. Low-density anteriorly near the falciform ligament on image 29/3 likely represents focal fat. No significant extrahepatic biliary dilatation post cholecystectomy. Pancreas: Unremarkable. No pancreatic ductal dilatation or surrounding inflammatory changes. Spleen: Normal in size without focal abnormality. Adrenals/Urinary Tract: Both adrenal glands appear normal. No focal renal mass or hydronephrosis identified. There is perinephric soft tissue stranding bilaterally which has mildly progressed compared with the remote CT. The bladder is decompressed by a Foley catheter. There is some air in the bladder lumen. Stomach/Bowel: No evidence of bowel wall thickening, distention or surrounding inflammatory change. Postsurgical changes in the right colon status post apparent ileocolonic anastomosis. There are sigmoid colon diverticular changes. Vascular/Lymphatic: There are no enlarged abdominal or pelvic lymph nodes. Mild aortic and branch vessel atherosclerosis. There is extrinsic mass effect on the intrahepatic portions of the IVC and right portal vein by the process in the right hepatic lobe. No large vessel occlusion identified. Reproductive: The prostate gland and seminal vesicles appear normal. Other: Soft tissue stranding throughout the perinephric and pelvic fat with a small amount of ascites. No focal fluid collection. Musculoskeletal: Status post left proximal femoral ORIF. There is a nondisplaced, mildly comminuted fracture of the right acetabulum and ischium, best seen on the reformatted images. There is a surrounding heterogeneous soft tissue mass which measures up to 11.5 x 9.7 cm on image 90/3. Some of this may reflect hematoma, although is suspicious for neoplasm. There is a component extending medially along the pelvic sidewall. The lesion involving the posterosuperior aspect of the L3 vertebral body on MRI is not well visualized. There is a probable small lytic lesion in the right  inferior pubic ramus, best seen  on coronal image 88/6. There are postsurgical changes in the lower lumbar spine. IMPRESSION: 1. Although the L3 lesion seen on MRI is not well seen by CT, there is an apparent partially calcified soft tissue mass associated with the right acetabulum and ischium with an underlying pathologic fracture. Some of the soft tissue component could be due to hematoma, although the appearance is suspicious for underlying neoplasm, possibly metastatic disease or myeloma. 2. In addition, there is suspicion of an ill-defined, mass centrally in the right hepatic lobe with associated volume loss and intrahepatic biliary dilatation. There are possible additional lesions as well. The liver findings may also reflect metastatic disease or cholangiocarcinoma. No morphologic changes of cirrhosis. 3. Tissue sampling recommended, likely easiest at the ischial soft tissue mass. Electronically Signed   By: Richardean Sale M.D.   On: 06/24/2018 15:04    Assessment/Plan:    Up with therapy has non displaced right acetabular Fx with no Hx of falls. Hematoma vs soft tissue mass adjacent.   Change PT to WBAT with walker right LE.  MRI liver pending.  Will stop ABX all cultures neg.  Continue work up for possible malignancy  Lucas Morales 06/25/2018, 7:55 AM

## 2018-06-25 NOTE — Progress Notes (Signed)
      INFECTIOUS DISEASE ATTENDING ADDENDUM:   Date: 06/25/2018  Patient name: Lucas Morales  Medical record number: [id number]  Date of birth: 1933-06-13   He now appears to have a malignancy that is responsible for his MRI findings  Antibiotics have been stopped  Please call with further questions.  Rhina Brackett Dam 06/25/2018, 11:18 AM

## 2018-06-25 NOTE — Progress Notes (Signed)
ANTICOAGULATION CONSULT NOTE - Initial Consult  Pharmacy Consult for heparin Indication: Blood clots/IVC thrombus  Allergies  Allergen Reactions  . Morphine Itching    Patient Measurements: Height: 6\' 2"  (188 cm) Weight: 223 lb 4.8 oz (101.3 kg) IBW/kg (Calculated) : 82.2 Heparin Dosing Weight: 101kg  Vital Signs:    Labs: Recent Labs    06/25/18 0851  CREATININE 0.85    Estimated Creatinine Clearance: 85.1 mL/min (by C-G formula based on SCr of 0.85 mg/dL).   Medical History: Past Medical History:  Diagnosis Date  . Adenomatous colon polyp   . Alpha-1-antitrypsin deficiency ([place])   . Alpha-1-antitrypsin deficiency carrier   . Anemia   . Anxiety   . Arthritis    "knees; bad in my back" (09/01/2014)  . Bruises easily   . Chronic bronchitis ([place])    "get it pretty much q yr" (09/01/2014)  . Chronic lower back pain   . Coronary artery disease   . Depression    "related to health" (09/01/2014)  . Diarrhea    has had a part of colon removed  . Diverticulitis   . Diverticulosis   . Dyspnea on exertion   . Dysrhythmia    bradycardia in the 30's (01/11/2012).. Afib briefly after CABG  . Esophageal dysmotility   . Esophageal stricture   . Gastritis   . GERD (gastroesophageal reflux disease)    takes Pantoprazole daily  . Gout   . Hemorrhoids   . Hiatal hernia   . History of colon polyps   . Hyperlipidemia    doesn't require meds  . Hypertension    takes Losartan daily; current not taking   . IC (interstitial cystitis)   . Insomnia    takes Nortrypyline nightly  . Joint pain   . Joint swelling   . Macular hole of both eyes   . Nocturia   . OSA (obstructive sleep apnea)    "mild; tried CPAP; couldn't tolerate" (09/01/2014)  . Paroxysmal atrial fibrillation ([facility])   . Restless leg syndrome   . Sciatic nerve pain   . Type II diabetes mellitus ([place])    "borderline"  . Urinary frequency   Assessment: 85 yo male with recent back surgery now found to have  possible malignancy. 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 Verbal order given to nursing to start heparin drip per Heme/Onc MD. No bleeding noted. Last PLT noted WNL on 4/4.  Goal of Therapy:  Heparin level 0.3-0.7 units/ml Monitor platelets by anticoagulation protocol: Yes   Plan:  Heparin 5000 units IV bolus x 1 Heparin drip at 1650 units/hr Heparin level 8 hours after start Daily Heparin level   Kayton Ripp A. Levada Dy, PharmD, [place] Please utilize Amion for appropriate phone number to reach the unit pharmacist ([place])   06/25/2018,5:39 PM

## 2018-06-25 NOTE — Progress Notes (Signed)
Nutrition Follow-up  RD working remotely.  DOCUMENTATION CODES:   Not applicable  INTERVENTION:   -D/c Ensure Enlive po BID, each supplement provides 350 kcal and 20 grams of protein -Boost Breeze po TID, each supplement provides 250 kcal and 9 grams of protein -30 ml Prostat BID, each supplement provides 100 kcals and 15 grams protein -Continue MVI with minerals daily  NUTRITION DIAGNOSIS:   Increased nutrient needs related to post-op healing as evidenced by estimated needs.  Ongoing  GOAL:   Patient will meet greater than or equal to 90% of their needs  Unmet  MONITOR:   PO intake, Supplement acceptance, Labs, Weight trends, I & O's, Skin  REASON FOR ASSESSMENT:   Malnutrition Screening Tool    ASSESSMENT:   85 year old male lives alone is admitted for suspected postoperative lumbar spine infection s/p surgery for severe stenosis at L3-4  Reviewed I/O's: -514 ml x 24 hours and +1.9 L since admission  UOP; 1.6 L x 24 hours  Reviewed oncology notes. CT scan revealed liver lesions, 3 liver tumors, and large mass next pelvis. Infectious disease has stopped all antibiotics secondary to malignancy.   Pt with poor appetite, consuming less than 50% of meals. He refused both Ensure and MVI today. RD will trial Boost Breeze and Prostat.   Labs reviewed: CBGS; 119 (inpatient orders for glycemic control are 0-15 units insulin aspart TID).   Diet Order:   Diet Order            Diet Carb Modified Fluid consistency: Thin; Room service appropriate? Yes  Diet effective now              EDUCATION NEEDS:   Not appropriate for education at this time  Skin:  Skin Assessment: Skin Integrity Issues: Skin Integrity Issues:: Incisions Incisions: closed back, closed rt eye  Last BM:  06/20/18  Height:   Ht Readings from Last 1 Encounters:  06/21/18 6\' 2"  (1.88 m)    Weight:   Wt Readings from Last 1 Encounters:  06/21/18 101.3 kg    Ideal Body Weight:  86.3  kg  BMI:  Body mass index is 28.67 kg/m.  Estimated Nutritional Needs:   Kcal:  2000-2300kcal/day   Protein:  100-120g/day   Fluid:  >2.1L/day     Lucas Morales A. Jimmye Norman, RD, LDN, [place] Registered Dietitian II Certified Diabetes Care and Education Specialist Pager: [phone] After hours Pager: [phone]

## 2018-06-26 ENCOUNTER — Inpatient Hospital Stay (HOSPITAL_COMMUNITY): Payer: Medicare Other

## 2018-06-26 DIAGNOSIS — I82409 Acute embolism and thrombosis of unspecified deep veins of unspecified lower extremity: Secondary | ICD-10-CM | POA: Diagnosis present

## 2018-06-26 LAB — HEPARIN LEVEL (UNFRACTIONATED)
Heparin Unfractionated: 0.4 IU/mL (ref 0.30–0.70)
Heparin Unfractionated: 0.51 IU/mL (ref 0.30–0.70)

## 2018-06-26 LAB — CBC
HCT: 37.6 % — ABNORMAL LOW (ref 39.0–52.0)
Hemoglobin: 12.4 g/dL — ABNORMAL LOW (ref 13.0–17.0)
MCH: 31.5 pg (ref 26.0–34.0)
MCHC: 33 g/dL (ref 30.0–36.0)
MCV: 95.4 fL (ref 80.0–100.0)
Platelets: 288 10*3/uL (ref 150–400)
RBC: 3.94 MIL/uL — ABNORMAL LOW (ref 4.22–5.81)
RDW: 15.3 % (ref 11.5–15.5)
WBC: 11.6 10*3/uL — ABNORMAL HIGH (ref 4.0–10.5)
nRBC: 0 % (ref 0.0–0.2)

## 2018-06-26 LAB — AEROBIC/ANAEROBIC CULTURE W GRAM STAIN (SURGICAL/DEEP WOUND)
Culture: NO GROWTH
Gram Stain: NONE SEEN

## 2018-06-26 LAB — GLUCOSE, CAPILLARY
Glucose-Capillary: 131 mg/dL — ABNORMAL HIGH (ref 70–99)
Glucose-Capillary: 112 mg/dL — ABNORMAL HIGH (ref 70–99)
Glucose-Capillary: 121 mg/dL — ABNORMAL HIGH (ref 70–99)
Glucose-Capillary: 143 mg/dL — ABNORMAL HIGH (ref 70–99)

## 2018-06-26 MED ORDER — IOHEXOL 300 MG/ML  SOLN
75.0000 mL | Freq: Once | INTRAMUSCULAR | Status: AC | PRN
  Administered 2018-06-26: 08:00:00 75 mL via INTRAVENOUS

## 2018-06-26 MED ORDER — ALUM & MAG HYDROXIDE-SIMETH 200-200-20 MG/5ML PO SUSP
30.0000 mL | ORAL | Status: DC | PRN
  Administered 2018-06-26: 22:00:00 30 mL via ORAL
  Filled 2018-06-26: qty 30

## 2018-06-26 NOTE — Progress Notes (Signed)
[place] for heparin Indication: Blood clots/IVC thrombus  Allergies  Allergen Reactions  . Morphine Itching    Patient Measurements: Height: 6\' 2"  (188 cm) Weight: 223 lb 4.8 oz (101.3 kg) IBW/kg (Calculated) : 82.2 Heparin Dosing Weight: 101kg  Vital Signs: Temp: 97.5 F (36.4 C) (04/08 0411) Temp Source: Oral (04/08 0411) BP: 130/97 (04/08 0411) Pulse Rate: 113 (04/08 0411)  Labs: Recent Labs    06/25/18 0851 06/26/18 0523  HGB  --  12.4*  HCT  --  37.6*  PLT  --  288  HEPARINUNFRC  --  0.40  CREATININE 0.85  --     Estimated Creatinine Clearance: 85.1 mL/min (by C-G formula based on SCr of 0.85 mg/dL).   Assessment: 85 yo male with recent back surgery now found to have possible malignancy. 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 Verbal order given to nursing to start heparin drip per Heme/Onc MD. No bleeding noted. Initial heparin level therapeutic.  Hg 12.4, PTLC 288  Goal of Therapy:  Heparin level 0.3-0.7 units/ml Monitor platelets by anticoagulation protocol: Yes   Plan:  Continue heparin drip at 1650 units/hr Heparin level in 6 hours to confirm Daily Heparin level and CBC Monitor for bleeding complications   Thanks for allowing pharmacy to be a part of this patient's care.  Excell Seltzer, PharmD Clinical Pharmacist

## 2018-06-26 NOTE — Progress Notes (Signed)
   06/26/18 1641  Vitals  Temp 97.9 F (36.6 C)  Temp Source Oral  BP (!) 154/91  MAP (mmHg) 107  BP Location Left Arm  BP Method Automatic  Patient Position (if appropriate) Lying  Pulse Rate (!) 105  Resp (!) 22  Oxygen Therapy  SpO2 94 %  O2 Device Room Air  MEWS Score  MEWS RR 1  MEWS Pulse 1  MEWS Systolic 0  MEWS LOC 0  MEWS Temp 0  MEWS Score 2  MEWS Score Color Yellow    Dr. Lorin Mercy notified above vital signs, will continue to monitor

## 2018-06-26 NOTE — Progress Notes (Signed)
Aware of IR request for possible image-guided pelvic mass biopsy.  Patient on Heparin drip. Will need 4 hour hold prior to moving forward with procedure. Plan to stop Heparin at 0500 tomorrow for procedure. Patient to be NPO at midnight.  Please call IR with questions/concerns.  Bea Graff Kayliah Tindol, PA-C 06/26/2018, 1:33 PM

## 2018-06-26 NOTE — Progress Notes (Signed)
[place] for heparin Indication: Blood clots/IVC thrombus  Allergies  Allergen Reactions  . Morphine Itching    Patient Measurements: Height: 6\' 2"  (188 cm) Weight: 223 lb 4.8 oz (101.3 kg) IBW/kg (Calculated) : 82.2 Heparin Dosing Weight: 101kg  Vital Signs: Temp: 97.5 F (36.4 C) (04/08 0411) Temp Source: Oral (04/08 0411) BP: 130/97 (04/08 0411) Pulse Rate: 113 (04/08 0411)  Labs: Recent Labs    06/25/18 0851 06/26/18 0523 06/26/18 1324  HGB  --  12.4*  --   HCT  --  37.6*  --   PLT  --  288  --   HEPARINUNFRC  --  0.40 0.51  CREATININE 0.85  --   --     Estimated Creatinine Clearance: 85.1 mL/min (by C-G formula based on SCr of 0.85 mg/dL).   Assessment: 85 yo male with recent back surgery now found to have possible malignancy. 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 Verbal order given to nursing to start heparin drip per Heme/Onc MD. No bleeding noted. Confirmatory heparin level therapeutic.  Hg 12.4, PTLC 288  Goal of Therapy:  Heparin level 0.3-0.7 units/ml Monitor platelets by anticoagulation protocol: Yes   Plan:  Continue heparin drip at 1650 units/hr Daily Heparin level and CBC Monitor for bleeding complications   Thanks for allowing pharmacy to be a part of this patient's care.  Alanda Slim, PharmD, [facility] Clinical Pharmacist Please see AMION for all Pharmacists' Contact Phone Numbers 06/26/2018, 2:14 PM

## 2018-06-26 NOTE — Progress Notes (Addendum)
Came to see pt to make rounds and he is getting CT scan of chest.  May be time to transfer him from my service. Not sure if biopsy has been ordered . Will discuss with dr. Marin Olp.  My cell [phone]

## 2018-06-26 NOTE — Progress Notes (Signed)
The MRI that Dr. [name] was incredibly helpful.  He had MRI of the liver.  He has multiple liver masses.  He has an IVC thrombus.  The MRI of his pelvis showed a large right pelvic mass.  This was present on the sciatic nerve.  He likely was present on his iliac veins and thus causing likely thrombus in the right leg.  It looks like  there might be a pathologic fracture in the right ischium.  He has metastatic lesions in the sacrum and right ilium.   His alpha-fetoprotein is slightly elevated.  This might give [place] an idea of this tumor being hepatocellular carcinoma.  However, there is no cirrhosis.  He has no obvious risk factors for hepatocellular carcinoma.  The next step clearly is a biopsy.  I did put him on a heparin infusion.  This was for his thromboembolic disease.  He clearly is hypercoagulable.  I think the right pelvic mass is best to biopsy.  The radiologist have a sense that we might be dealing with a cholangiocarcinoma.  If this is the case, this might be very difficult to treat.  Given that he has compression of the sciatic nerve, I will start him on some steroids to see if this might help.  There is no fever.  He has had no rash.  His SPEP is negative.  I would like to see what his LDH is.  The best option or best outcome here is going to be lymphoma.  This would be a very unusual presentation for lymphoma.  I had a long talk with Dr. [name] this morning.  I explained to him what I thought was going on.  I told him that we do not 100% know that he has cancer, but I highly suspect this.  I told him that we have to get a biopsy to confirm what is going on.  Once we have a biopsy that confirms malignancy, we will send the biopsy off for molecular profiling to see if we can use immunotherapy or targeted therapy.  I told him that if this was malignant, that in all likelihood he was not curable.  He is definitely treatable.  I think that he has a decent performance  status.  He understands this.  He wants me to speak to his daughter.  I will speak to her today.  I cannot think of any other test to run right now.  He probably might benefit from a CT of the chest just to make sure were not missing something in the lungs that could be causing this problem.  I do not think he is a smoker so I would not suspect bronchogenic carcinoma or small cell carcinoma.  This is a very complicated case.  He will need long-term anticoagulation.  I think once we know that all invasive procedures are completed, I probably would put him on Lovenox as an outpatient.  I will be off for the next 2 days.  I will still round on him.  If there are any issues or questions, my partners will be more than happy to help out.  Lattie Haw, MD  Psalms 119:11

## 2018-06-26 NOTE — Progress Notes (Addendum)
OT Cancellation Note  Patient Details Name: Lucas Morales MRN: [id number] DOB: December 19, 1933   Cancelled Treatment:    Reason Eval/Treat Not Completed: Patient not medically ready(awaiting 24 hour heparin mark ) OT /PT will hold evaluation at this time due to recommended guidelines.  Richelle Ito, OTR/L  Acute Rehabilitation Services Pager: [phone] Office: Ferguson Migdalia Dk PT, DPT Acute Rehabilitation Services Pager [phone] Office [phone]  .  06/26/2018, 11:09 AM

## 2018-06-27 ENCOUNTER — Inpatient Hospital Stay (HOSPITAL_COMMUNITY): Payer: Medicare Other

## 2018-06-27 ENCOUNTER — Encounter (HOSPITAL_COMMUNITY): Payer: Self-pay | Admitting: Internal Medicine

## 2018-06-27 DIAGNOSIS — I2510 Atherosclerotic heart disease of native coronary artery without angina pectoris: Secondary | ICD-10-CM | POA: Diagnosis present

## 2018-06-27 DIAGNOSIS — G893 Neoplasm related pain (acute) (chronic): Secondary | ICD-10-CM | POA: Diagnosis present

## 2018-06-27 DIAGNOSIS — E119 Type 2 diabetes mellitus without complications: Secondary | ICD-10-CM

## 2018-06-27 DIAGNOSIS — Z515 Encounter for palliative care: Secondary | ICD-10-CM | POA: Insufficient documentation

## 2018-06-27 DIAGNOSIS — N179 Acute kidney failure, unspecified: Secondary | ICD-10-CM | POA: Diagnosis present

## 2018-06-27 DIAGNOSIS — I82409 Acute embolism and thrombosis of unspecified deep veins of unspecified lower extremity: Secondary | ICD-10-CM

## 2018-06-27 DIAGNOSIS — Z66 Do not resuscitate: Secondary | ICD-10-CM | POA: Diagnosis present

## 2018-06-27 LAB — COMPREHENSIVE METABOLIC PANEL
ALT: 45 U/L — ABNORMAL HIGH (ref 0–44)
AST: 43 U/L — ABNORMAL HIGH (ref 15–41)
Albumin: 2.7 g/dL — ABNORMAL LOW (ref 3.5–5.0)
Alkaline Phosphatase: 508 U/L — ABNORMAL HIGH (ref 38–126)
Anion gap: 14 (ref 5–15)
BUN: 49 mg/dL — ABNORMAL HIGH (ref 8–23)
CO2: 18 mmol/L — ABNORMAL LOW (ref 22–32)
Calcium: 8.8 mg/dL — ABNORMAL LOW (ref 8.9–10.3)
Chloride: 95 mmol/L — ABNORMAL LOW (ref 98–111)
Creatinine, Ser: 1.94 mg/dL — ABNORMAL HIGH (ref 0.61–1.24)
GFR calc Af Amer: 36 mL/min — ABNORMAL LOW (ref >60)
GFR calc non Af Amer: 31 mL/min — ABNORMAL LOW (ref >60)
Glucose, Bld: 149 mg/dL — ABNORMAL HIGH (ref 70–99)
Potassium: 4.6 mmol/L (ref 3.5–5.1)
Sodium: 127 mmol/L — ABNORMAL LOW (ref 135–145)
Total Bilirubin: 1.2 mg/dL (ref 0.3–1.2)
Total Protein: 6.2 g/dL — ABNORMAL LOW (ref 6.5–8.1)

## 2018-06-27 LAB — URINALYSIS, ROUTINE W REFLEX MICROSCOPIC
Bilirubin Urine: NEGATIVE
Glucose, UA: NEGATIVE mg/dL
Ketones, ur: NEGATIVE mg/dL
Leukocytes,Ua: NEGATIVE
Nitrite: NEGATIVE
Protein, ur: NEGATIVE mg/dL
Specific Gravity, Urine: 1.025 (ref 1.005–1.030)
pH: 5 (ref 5.0–8.0)

## 2018-06-27 LAB — GLUCOSE, CAPILLARY
Glucose-Capillary: 148 mg/dL — ABNORMAL HIGH (ref 70–99)
Glucose-Capillary: 163 mg/dL — ABNORMAL HIGH (ref 70–99)
Glucose-Capillary: 173 mg/dL — ABNORMAL HIGH (ref 70–99)
Glucose-Capillary: 167 mg/dL — ABNORMAL HIGH (ref 70–99)

## 2018-06-27 LAB — CBC WITH DIFFERENTIAL/PLATELET
Abs Immature Granulocytes: 0.09 10*3/uL — ABNORMAL HIGH (ref 0.00–0.07)
Basophils Absolute: 0 10*3/uL (ref 0.0–0.1)
Basophils Relative: 0 %
Eosinophils Absolute: 0 10*3/uL (ref 0.0–0.5)
Eosinophils Relative: 0 %
HCT: 36.3 % — ABNORMAL LOW (ref 39.0–52.0)
Hemoglobin: 12.3 g/dL — ABNORMAL LOW (ref 13.0–17.0)
Immature Granulocytes: 1 %
Lymphocytes Relative: 9 %
Lymphs Abs: 1.2 10*3/uL (ref 0.7–4.0)
MCH: 32 pg (ref 26.0–34.0)
MCHC: 33.9 g/dL (ref 30.0–36.0)
MCV: 94.5 fL (ref 80.0–100.0)
Monocytes Absolute: 0.9 10*3/uL (ref 0.1–1.0)
Monocytes Relative: 6 %
Neutro Abs: 11.5 10*3/uL — ABNORMAL HIGH (ref 1.7–7.7)
Neutrophils Relative %: 84 %
Platelets: 300 10*3/uL (ref 150–400)
RBC: 3.84 MIL/uL — ABNORMAL LOW (ref 4.22–5.81)
RDW: 15.3 % (ref 11.5–15.5)
WBC: 13.7 10*3/uL — ABNORMAL HIGH (ref 4.0–10.5)
nRBC: 0 % (ref 0.0–0.2)

## 2018-06-27 LAB — SODIUM, URINE, RANDOM: Sodium, Ur: 18 mmol/L

## 2018-06-27 LAB — HEPARIN LEVEL (UNFRACTIONATED): Heparin Unfractionated: <0.1 IU/mL — ABNORMAL LOW (ref 0.30–0.70)

## 2018-06-27 LAB — LACTATE DEHYDROGENASE: LDH: 140 U/L (ref 98–192)

## 2018-06-27 MED ORDER — OXYCODONE HCL ER 15 MG PO T12A
15.0000 mg | EXTENDED_RELEASE_TABLET | Freq: Two times a day (BID) | ORAL | Status: DC
  Administered 2018-06-27 – 2018-07-02 (×11): 15 mg via ORAL
  Filled 2018-06-27 (×11): qty 1

## 2018-06-27 MED ORDER — MIDAZOLAM HCL 2 MG/2ML IJ SOLN
INTRAMUSCULAR | Status: AC | PRN
  Administered 2018-06-27: 1 mg via INTRAVENOUS

## 2018-06-27 MED ORDER — MIDAZOLAM HCL 2 MG/2ML IJ SOLN
INTRAMUSCULAR | Status: AC
  Filled 2018-06-27: qty 2

## 2018-06-27 MED ORDER — ENOXAPARIN SODIUM 100 MG/ML ~~LOC~~ SOLN
1.0000 mg/kg | Freq: Two times a day (BID) | SUBCUTANEOUS | Status: DC
  Administered 2018-06-27 – 2018-07-05 (×16): 100 mg via SUBCUTANEOUS
  Filled 2018-06-27 (×16): qty 1

## 2018-06-27 MED ORDER — PANTOPRAZOLE SODIUM 40 MG PO TBEC
40.0000 mg | DELAYED_RELEASE_TABLET | Freq: Two times a day (BID) | ORAL | Status: DC
  Administered 2018-06-27 – 2018-07-19 (×45): 40 mg via ORAL
  Filled 2018-06-27 (×45): qty 1

## 2018-06-27 MED ORDER — OXYCODONE HCL ER 10 MG PO T12A: 10.0000 mg | EXTENDED_RELEASE_TABLET | Freq: Two times a day (BID) | ORAL | Status: DC

## 2018-06-27 MED ORDER — FLUCONAZOLE 100 MG PO TABS
100.0000 mg | ORAL_TABLET | Freq: Every day | ORAL | Status: DC
  Administered 2018-06-27 – 2018-07-04 (×8): 100 mg via ORAL
  Filled 2018-06-27 (×9): qty 1

## 2018-06-27 MED ORDER — ZOLEDRONIC ACID 4 MG/5ML IV CONC
4.0000 mg | Freq: Once | INTRAVENOUS | Status: AC
  Administered 2018-06-27: 08:00:00 4 mg via INTRAVENOUS
  Filled 2018-06-27: qty 5

## 2018-06-27 MED ORDER — FENTANYL CITRATE (PF) 100 MCG/2ML IJ SOLN
INTRAMUSCULAR | Status: AC | PRN
  Administered 2018-06-27: 25 ug via INTRAVENOUS

## 2018-06-27 MED ORDER — OXYCODONE HCL 5 MG PO TABS
10.0000 mg | ORAL_TABLET | Freq: Four times a day (QID) | ORAL | Status: DC | PRN
  Administered 2018-06-28 (×2): 10 mg via ORAL
  Filled 2018-06-27 (×2): qty 2

## 2018-06-27 MED ORDER — POLYETHYLENE GLYCOL 3350 17 G PO PACK
17.0000 g | PACK | Freq: Every day | ORAL | Status: DC
  Administered 2018-06-27 – 2018-07-01 (×5): 17 g via ORAL
  Filled 2018-06-27 (×5): qty 1

## 2018-06-27 MED ORDER — CLONAZEPAM 0.5 MG PO TABS
0.5000 mg | ORAL_TABLET | Freq: Every day | ORAL | Status: DC
  Administered 2018-06-27: 22:00:00 0.5 mg via ORAL
  Filled 2018-06-27: qty 1

## 2018-06-27 MED ORDER — SODIUM CHLORIDE 0.9 % IV SOLN
INTRAVENOUS | Status: DC
  Administered 2018-06-27 – 2018-07-08 (×14): via INTRAVENOUS

## 2018-06-27 MED ORDER — FENTANYL CITRATE (PF) 100 MCG/2ML IJ SOLN
INTRAMUSCULAR | Status: AC
  Filled 2018-06-27: qty 2

## 2018-06-27 MED ORDER — DEXAMETHASONE SODIUM PHOSPHATE 10 MG/ML IJ SOLN
20.0000 mg | Freq: Every day | INTRAMUSCULAR | Status: DC
  Administered 2018-06-27 – 2018-06-28 (×2): 20 mg via INTRAVENOUS
  Filled 2018-06-27 (×2): qty 2

## 2018-06-27 NOTE — Procedures (Signed)
  Procedure: CT core biopsy lytic R pelvic bone lesion   EBL:   minimal Complications:  none immediate  See full dictation in [facility].  Dillard Cannon MD Main # [phone] Pager  [phone]

## 2018-06-27 NOTE — Progress Notes (Signed)
Subjective:    Patient reports pain as moderate.    Objective: Vital signs in last 24 hours: Temp:  [97.7 F (36.5 C)-98.6 F (37 C)] 98.4 F (36.9 C) (04/08 2017) Pulse Rate:  [100-114] 114 (04/08 2017) Resp:  [15-24] 15 (04/08 2017) BP: (142-159)/(86-111) 146/100 (04/08 2017) SpO2:  [94 %-97 %] 97 % (04/08 2017)  Intake/Output from previous day: 04/08 0701 - 04/09 0700 In: -  Out: 225 [Urine:225] Intake/Output this shift: No intake/output data recorded.  Recent Labs    06/26/18 0523 06/27/18 0655  HGB 12.4* 12.3*   Recent Labs    06/26/18 0523 06/27/18 0655  WBC 11.6* 13.7*  RBC 3.94* 3.84*  HCT 37.6* 36.3*  PLT 288 300   Recent Labs    06/25/18 0851  CREATININE 0.85   No results for input(s): LABPT, INR in the last 72 hours.  no LE edema.  Dg Pelvis 1-2 Views  Result Date: 06/26/2018 CLINICAL DATA:  Pelvic fracture. EXAM: PELVIS - 1-2 VIEW COMPARISON:  MRI pelvis 06/25/2018. Nuclear medicine bone scan 06/25/2018. CT abdomen and pelvis 06/24/2018. FINDINGS: There is heterogeneously increased density in the right ischium with superimposed curvilinear lucency corresponding to the pathologic fracture shown on the prior studies without significant displacement evident on these AP radiographs. The femoral heads are approximated with the acetabula on these AP radiographs. A remote, healed left femur fracture is again noted with partial visualization of an intramedullary nail and hip screw. IMPRESSION: Known pathologic fracture of the right ischium. Electronically Signed   By: Logan Bores M.D.   On: 06/26/2018 20:46   Ct Chest W Contrast  Result Date: 06/26/2018 CLINICAL DATA:  Osseous lesions involving the L3 vertebral body, right acetabulum and ischium suspicious for metastatic disease. Ill-defined right hepatic lesion. Assess for primary lung cancer. EXAM: CT CHEST WITH CONTRAST TECHNIQUE: Multidetector CT imaging of the chest was performed during intravenous  contrast administration. CONTRAST:  6mL OMNIPAQUE IOHEXOL 300 MG/ML  SOLN COMPARISON:  Abdominopelvic CT 06/24/2018, abdominal MRI 06/25/2018, cardiac CT 12/17/2008 and bone scan 06/25/2018. FINDINGS: Cardiovascular: Contrast bolus is suboptimal. There is atherosclerosis of the aorta, great vessels and coronary arteries status post median sternotomy and CABG. Right arm PICC extends to the superior cavoatrial junction. There is stable mild cardiomegaly. No significant pericardial effusion. Mediastinum/Nodes: There are no enlarged mediastinal, hilar or axillary lymph nodes.Moderate to large hiatal hernia again noted. The trachea and thyroid gland appear unremarkable. Lungs/Pleura: There is no pleural effusion or pneumothorax. There is mild chronic lung disease with central airway thickening and subpleural reticulation. There are multiple noncalcified pulmonary nodules which appear new compared with the remote cardiac CT (the lungs are incompletely visualized at that time). The largest nodule is located posteriorly in the right upper lobe along the superior aspect of the major fissure, measuring 18 x 13 mm on image 81/4. Other nodules include a 12 x 7 mm right upper lobe nodule (image 66/4), an 8 x 7 mm right lower lobe nodule (image 120/4) and a 9 mm left upper lobe nodule (image 41/4). There is a calcified right upper lobe granuloma. Upper abdomen: As seen on recent CT and MRI, there is a peripherally enhancing mass centrally in the right hepatic lobe measuring up to 5.4 x 4.4 cm on image 138/3. Adjacent satellite lesions, IVC extension of tumor and intrahepatic biliary dilatation posteriorly in the right hepatic lobe are again noted. Musculoskeletal/Chest wall: There is no chest wall mass or suspicious osseous finding. Previous median sternotomy and  lower cervical fusion. There are loose bodies within the right shoulder joint. IMPRESSION: 1. There are multiple pulmonary nodules bilaterally, including a dominant  irregular right upper lobe lesion which could reflect primary lung cancer. The other nodules appear new from remote cardiac CT, suspicious for metastatic disease. No thoracic adenopathy. 2. Moderate to large hiatal hernia. 3. Central mass in the right hepatic lobe with associated biliary dilatation again noted as seen on recent abdominal studies. 4. Based on the patient's prior studies, it remains uncertain as to what the primary malignancy is in this case. 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 Electronically Signed   By: Richardean Sale M.D.   On: 06/26/2018 09:18    Assessment/Plan:    Plan:   Biopsy this AM, heparin drip off since 5 AM. Lovenox post biopsy. Will add some oxycontin q 12 hrs for pain.  Lucas Morales 06/27/2018, 8:07 AM

## 2018-06-27 NOTE — Progress Notes (Signed)
The chaplain received the Pt. spiritual care consult from PMT and plans to visit the Pt. on 4/10.

## 2018-06-27 NOTE — Consult Note (Signed)
Chief Complaint: Patient was seen in consultation today for pelvic mass  Referring Physician(s): Lanae Crumbly  Supervising Physician: Arne Cleveland  Patient Status: [facility] - In-pt  History of Present Illness: Lucas Morales is a 85 y.o. male with past medical history of HTN, depression, anxiety, CAD, cystitis, DM, and chronic back pain who was admitted for L3 vertebral body lesion after L3-L4 surgical decompression 05/10/18.  This was initially thought to be osteomyelitis, however his aspiration of the area was without growth and his back pain persisted.  He developed difficulty moving his lower extremities. CT Abdomen Pelvis 06/24/18 showed: 1. Although the L3 lesion seen on MRI is not well seen by CT, there is an apparent partially calcified soft tissue mass associated with the right acetabulum and ischium with an underlying pathologic fracture. Some of the soft tissue component could be due to hematoma, although the appearance is suspicious for underlying neoplasm, possibly metastatic disease or myeloma. 2. In addition, there is suspicion of an ill-defined, mass centrally in the right hepatic lobe with associated volume loss and intrahepatic biliary dilatation. There are possible additional lesions as well. The liver findings may also reflect metastatic disease or cholangiocarcinoma. No morphologic changes of cirrhosis. 3. Tissue sampling recommended, likely easiest at the ischial soft tissue mass.  NM Bone Scan showed: 1. Right hip and ischium uptake. This corresponds to fractures noted on the recent CT, where there was a surrounding soft tissue mass. There is also focal uptake in the proximal right humeral shaft. This additional area of uptake is concerning for metastatic disease. 2. No other evidence of metastatic disease. 3. There are other areas of degenerative and reactive uptake as Detailed.  IR consulted for biopsy at the request of Dr. Marin Olp.  Patient has been  NPO.  His heparin was held at [facility]   Past Medical History:  Diagnosis Date   Adenomatous colon polyp    Alpha-1-antitrypsin deficiency ([place])    Alpha-1-antitrypsin deficiency carrier    Anemia    Anxiety    Arthritis    "knees; bad in my back" (09/01/2014)   Bruises easily    Chronic bronchitis ([place])    "get it pretty much q yr" (09/01/2014)   Chronic lower back pain    Coronary artery disease    Depression    "related to health" (09/01/2014)   Diarrhea    has had a part of colon removed   Diverticulitis    Diverticulosis    Dyspnea on exertion    Dysrhythmia    bradycardia in the 30's (01/11/2012).. Afib briefly after CABG   Esophageal dysmotility    Esophageal stricture    Gastritis    GERD (gastroesophageal reflux disease)    takes Pantoprazole daily   Gout    Hemorrhoids    Hiatal hernia    History of colon polyps    Hyperlipidemia    doesn't require meds   Hypertension    takes Losartan daily; current not taking    IC (interstitial cystitis)    Insomnia    takes Nortrypyline nightly   Joint pain    Joint swelling    Macular hole of both eyes    Nocturia    OSA (obstructive sleep apnea)    "mild; tried CPAP; couldn't tolerate" (09/01/2014)   Paroxysmal atrial fibrillation (HCC)    Restless leg syndrome    Sciatic nerve pain    Type II diabetes mellitus ([place])    "borderline"   Urinary frequency  Past Surgical History:  Procedure Laterality Date   25 GAUGE PARS PLANA VITRECTOMY WITH 20 GAUGE MVR PORT FOR MACULAR HOLE Right 10/08/2012   Procedure: 25 GAUGE PARS PLANA VITRECTOMY WITH 20 GAUGE MVR PORT FOR MACULAR HOLE; Membrame peel, serum patch; Laser treatment ; Gas exchange;  Surgeon: Hayden Pedro, MD;  Location: [place];  Service: Ophthalmology;  Laterality: Right;   25 GAUGE PARS PLANA VITRECTOMY WITH 20 GAUGE MVR PORT FOR MACULAR HOLE Left 09/01/2014   Procedure: 25 GAUGE PARS PLANA VITRECTOMY WITH 20 GAUGE MVR  PORT FOR MACULAR HOLE;  Surgeon: Hayden Pedro, MD;  Location: [place];  Service: Ophthalmology;  Laterality: Left;   ANTERIOR CERVICAL DECOMP/DISCECTOMY FUSION N/A 12/20/2015   Procedure: C5-6 Anterior Cervical Discectomy and Fusion, Allograft, and Plate;  Surgeon: [name], MD;  Location: [place];  Service: Orthopedics;  Laterality: N/A;   APPENDECTOMY  2009   bladder lesion removed     CARDIAC CATHETERIZATION  01/11/2012   CATARACT EXTRACTION W/ INTRAOCULAR LENS  IMPLANT, BILATERAL Bilateral 2011   CHOLECYSTECTOMY  2009   COLONOSCOPY     COMPRESSION HIP SCREW Left 04/04/2014   Procedure: COMPRESSION HIP LEFT;  Surgeon: Mcarthur Rossetti, MD;  Location: [place];  Service: Orthopedics;  Laterality: Left;   CORONARY ANGIOPLASTY     CORONARY ARTERY BYPASS GRAFT  01/18/2012   Procedure: CORONARY ARTERY BYPASS GRAFTING (CABG);  Surgeon: Ivin Poot, MD;  Location: [place];  Service: Open Heart Surgery;  Laterality: N/A;  times four, using left internal mammary artery   CYSTOSCOPY WITH URETEROSCOPY  2008; 2009   "painted inside of bladder wall"   ELBOW SURGERY Right 1988   "reconstructive OR"   ENDOVEIN HARVEST OF GREATER SAPHENOUS VEIN  01/18/2012   Procedure: ENDOVEIN HARVEST OF GREATER SAPHENOUS VEIN;  Surgeon: Ivin Poot, MD;  Location: [place];  Service: Open Heart Surgery;  Laterality: Right;   ESOPHAGOGASTRODUODENOSCOPY     ESOPHAGOGASTRODUODENOSCOPY N/A 06/05/2014   Procedure: ESOPHAGOGASTRODUODENOSCOPY (EGD);  Surgeon: Irene Shipper, MD;  Location: [facility];  Service: Endoscopy;  Laterality: N/A;   EXCISION/RELEASE BURSA HIP Left 07/30/2015   Procedure: EXCISION/RELEASE BURSA HIP;  Surgeon: Mcarthur Rossetti, MD;  Location: [facility];  Service: Orthopedics;  Laterality: Left;   EXPLORATORY LAPAROTOMY  2009   with right colectomy   FRACTURE SURGERY     GAS INSERTION Left 09/01/2014   Procedure: INSERTION OF GAS;  Surgeon: Hayden Pedro, MD;  Location:  [place];  Service: Ophthalmology;  Laterality: Left;  C3F8   GAS/FLUID EXCHANGE Left 09/01/2014   Procedure: GAS/FLUID EXCHANGE;  Surgeon: Hayden Pedro, MD;  Location: [place];  Service: Ophthalmology;  Laterality: Left;   HARDWARE REMOVAL Left 07/30/2015   Procedure: attempted LEFT HIP HARDWARE REMOVAL, LEFT HIP BURSECTOMY;  Surgeon: Mcarthur Rossetti, MD;  Location: [facility];  Service: Orthopedics;  Laterality: Left;   HEEL SPUR EXCISION Bilateral 1970's   INTERTROCHANTERIC HIP FRACTURE SURGERY Left 04/04/2014   IR US GUIDE BX ASP/DRAIN  06/21/2018   JOINT REPLACEMENT     LEFT HEART CATH AND CORS/GRAFTS ANGIOGRAPHY N/A 05/28/2017   Procedure: LEFT HEART CATH AND CORS/GRAFTS ANGIOGRAPHY;  Surgeon: Belva Crome, MD;  Location: [place] [facility];  Service: Cardiovascular;  Laterality: N/A;   LEFT HEART CATHETERIZATION WITH CORONARY ANGIOGRAM N/A 01/11/2012   Procedure: LEFT HEART CATHETERIZATION WITH CORONARY ANGIOGRAM;  Surgeon: Leonie Man, MD;  Location: [facility];  Service: Cardiovascular;  Laterality:  N/A;   LUMBAR DISC SURGERY  ?2010   "cleaned out the stenosis" (01/11/2012)   LUMBAR LAMINECTOMY/DECOMPRESSION MICRODISCECTOMY N/A 05/10/2018   Procedure: redo lumbar decompression L3-4;  Surgeon: [name], MD;  Location: [place];  Service: Orthopedics;  Laterality: N/A;   MEMBRANE PEEL Left 09/01/2014   Procedure: MEMBRANE PEEL;  Surgeon: Hayden Pedro, MD;  Location: [place];  Service: Ophthalmology;  Laterality: Left;   NM MYOCAR MULTIPLE W/SPECT  04/09/2012   NORMAL STRESS NUCLEAR STUDY. EF 51%.   PARS PLANA VITRECTOMY W/ REPAIR OF MACULAR HOLE Left 09/01/2014   PARTIAL COLECTOMY     d/t diverticulosis   PHOTOCOAGULATION WITH LASER Left 09/01/2014   Procedure: PHOTOCOAGULATION WITH LASER;  Surgeon: Hayden Pedro, MD;  Location: [place];  Service: Ophthalmology;  Laterality: Left;  HEADSCOPE LASER   RECONSTRUCTION MEDIAL COLLATERAL LIGAMENT ELBOW W/ TENDON GRAFT Right  1988 X 2   SERUM PATCH Left 09/01/2014   Procedure: SERUM PATCH;  Surgeon: Hayden Pedro, MD;  Location: [place];  Service: Ophthalmology;  Laterality: Left;   TONSILLECTOMY AND ADENOIDECTOMY  ~1943   TOTAL KNEE ARTHROPLASTY Bilateral ~ 2001; 2011   right; left   TRANSTHORACIC ECHOCARDIOGRAM  01/04/2012   MODERATE SEVERE-SEVERE LVH. EF=>55%. MILDLY IMPAIRED LV RELAXATION. LA IS MODERATE TO SEVERE DILATED. MODERATE MR. MV LEAFLETS APPEAR THICHENED.   TUMOR EXCISION Left 12/2011   arm    Allergies: Morphine  Medications: Prior to Admission medications   Medication Sig Start Date End Date Taking? Authorizing Provider  allopurinol (ZYLOPRIM) 300 MG tablet Take 300 mg by mouth daily. 05/05/15  Yes [provider]  APPLE CIDER VINEGAR PO Take 1 capsule by mouth 2 (two) times daily.   Yes [provider]  aspirin EC 81 MG tablet Take 81 mg by mouth daily.   Yes [provider]  atorvastatin (LIPITOR) 40 MG tablet Take 40 mg by mouth at bedtime.  05/13/18  Yes [provider]  calcium elemental as carbonate (TUMS ULTRA 1000) 400 MG chewable tablet Chew 4,000 mg by mouth daily.   Yes [provider]  FIBER PO Take 3 capsules by mouth daily as needed (to bulk up stool).   Yes [provider]  hydrocortisone cream 1 % Apply 1 application topically at bedtime as needed for itching (rash).   Yes [provider]  loperamide (IMODIUM A-D) 2 MG tablet Take 6 mg by mouth daily as needed for diarrhea or loose stools.   Yes [provider]  metoprolol succinate (TOPROL-XL) 50 MG 24 hr tablet Take 1 tablet (50 mg total) by mouth daily. Patient taking differently: Take 50 mg by mouth at bedtime.  05/06/18  Yes Almyra Deforest, PA  OVER THE COUNTER MEDICATION Apply 1 application topically daily as needed (hip pain). CBD cream and/or tincture   Yes [provider]  oxyCODONE-acetaminophen (PERCOCET/ROXICET) 5-325 MG tablet Take 1  tablet by mouth every 4 (four) hours as needed for severe pain. 06/18/18  Yes [name], MD  pantoprazole (PROTONIX) 40 MG tablet Take 1 tablet (40 mg total) by mouth 2 (two) times daily. 06/06/14  Yes Charlynne Cousins, MD  tolnaftate (TINACTIN) 1 % cream Apply 1 application topically 3 (three) times daily as needed (itching/rash).    Yes [provider]  predniSONE (STERAPRED UNI-PAK 21 TAB) 5 MG (21) TBPK tablet Take as instruction , take with food, 6 tablets the first day, then 5,4,3,2,1. Then stop Patient not taking: Reported on  06/21/2018 06/10/18   [name], MD     Family History  Problem Relation Age of Onset   Emphysema Mother    Kidney disease Father    Emphysema Maternal Grandfather    Colon cancer Neg Hx     Social History   Socioeconomic History   Marital status: Divorced    Spouse name: Not on file   Number of children: 2   Years of education: Not on file   Highest education level: Doctorate  Occupational History   Occupation: Retired    Fish farm manager: RETIRED  Scientist, product/process development strain: Not hard at all   Food insecurity:    Worry: Never true    Inability: Never true   Transportation needs:    Medical: No    Non-medical: No  Tobacco Use   Smoking status: Never Smoker   Smokeless tobacco: Never Used  Substance and Sexual Activity   Alcohol use: Yes    Comment: 09/01/2014 "glass of wine a few times/yr; if that"   Drug use: No    Comment: CDD cream for pain to hip tried recently    Sexual activity: Yes    Birth control/protection: None  Lifestyle   Physical activity:    Days per week: Patient refused    Minutes per session: Patient refused   Stress: Patient refused  Relationships   Social connections:    Talks on phone: Patient refused    Gets together: Patient refused    Attends religious service: Patient refused    Active member of club or organization: Patient refused    Attends meetings of clubs or  organizations: Patient refused    Relationship status: Patient refused  Other Topics Concern   Not on file  Social History Narrative   Not on file     Review of Systems: A 12 point ROS discussed and pertinent positives are indicated in the HPI above.  All other systems are negative.  Review of Systems  Constitutional: Negative for fatigue and fever.  Respiratory: Negative for cough and shortness of breath.   Cardiovascular: Negative for chest pain.  Gastrointestinal: Negative for abdominal pain.  Musculoskeletal: Positive for back pain.  Neurological: Positive for weakness.  Psychiatric/Behavioral: Negative for behavioral problems and confusion.    Vital Signs: BP (!) 142/86 (BP Location: Left Arm)    Pulse 100    Temp 98.6 F (37 C) (Oral)    Resp 20    Ht _0  (1.88 m)    Wt 223 lb 4.8 oz (101.3 kg)    SpO2 96%    BMI 28.67 kg/m   Physical Exam Vitals signs and nursing note reviewed.  Cardiovascular:     Rate and Rhythm: Normal rate and regular rhythm.  Pulmonary:     Effort: Pulmonary effort is normal. No respiratory distress.     Breath sounds: Normal breath sounds.  Abdominal:     General: Abdomen is flat. There is no distension.     Palpations: Abdomen is soft.     Tenderness: There is no abdominal tenderness.  Skin:    General: Skin is warm and dry.  Neurological:     General: No focal deficit present.     Mental Status: He is alert. Mental status is at baseline.  Psychiatric:        Mood and Affect: Mood normal.        Behavior: Behavior normal.        Thought Content: Thought content  normal.        Judgment: Judgment normal.          Imaging: Dg Pelvis 1-2 Views  Result Date: 06/26/2018 CLINICAL DATA:  Pelvic fracture. EXAM: PELVIS - 1-2 VIEW COMPARISON:  MRI pelvis 06/25/2018. Nuclear medicine bone scan 06/25/2018. CT abdomen and pelvis 06/24/2018. FINDINGS: There is heterogeneously increased density in the right ischium with superimposed  curvilinear lucency corresponding to the pathologic fracture shown on the prior studies without significant displacement evident on these AP radiographs. The femoral heads are approximated with the acetabula on these AP radiographs. A remote, healed left femur fracture is again noted with partial visualization of an intramedullary nail and hip screw. IMPRESSION: Known pathologic fracture of the right ischium. Electronically Signed   By: Logan Bores M.D.   On: 06/26/2018 20:46   Ct Chest W Contrast  Result Date: 06/26/2018 CLINICAL DATA:  Osseous lesions involving the L3 vertebral body, right acetabulum and ischium suspicious for metastatic disease. Ill-defined right hepatic lesion. Assess for primary lung cancer. EXAM: CT CHEST WITH CONTRAST TECHNIQUE: Multidetector CT imaging of the chest was performed during intravenous contrast administration. CONTRAST:  89m OMNIPAQUE IOHEXOL 300 MG/ML  SOLN COMPARISON:  Abdominopelvic CT 06/24/2018, abdominal MRI 06/25/2018, cardiac CT 12/17/2008 and bone scan 06/25/2018. FINDINGS: Cardiovascular: Contrast bolus is suboptimal. There is atherosclerosis of the aorta, great vessels and coronary arteries status post median sternotomy and CABG. Right arm PICC extends to the superior cavoatrial junction. There is stable mild cardiomegaly. No significant pericardial effusion. Mediastinum/Nodes: There are no enlarged mediastinal, hilar or axillary lymph nodes.Moderate to large hiatal hernia again noted. The trachea and thyroid gland appear unremarkable. Lungs/Pleura: There is no pleural effusion or pneumothorax. There is mild chronic lung disease with central airway thickening and subpleural reticulation. There are multiple noncalcified pulmonary nodules which appear new compared with the remote cardiac CT (the lungs are incompletely visualized at that time). The largest nodule is located posteriorly in the right upper lobe along the superior aspect of the major fissure, measuring  18 x 13 mm on image 81/4. Other nodules include a 12 x 7 mm right upper lobe nodule (image 66/4), an 8 x 7 mm right lower lobe nodule (image 120/4) and a 9 mm left upper lobe nodule (image 41/4). There is a calcified right upper lobe granuloma. Upper abdomen: As seen on recent CT and MRI, there is a peripherally enhancing mass centrally in the right hepatic lobe measuring up to 5.4 x 4.4 cm on image 138/3. Adjacent satellite lesions, IVC extension of tumor and intrahepatic biliary dilatation posteriorly in the right hepatic lobe are again noted. Musculoskeletal/Chest wall: There is no chest wall mass or suspicious osseous finding. Previous median sternotomy and lower cervical fusion. There are loose bodies within the right shoulder joint. IMPRESSION: 1. There are multiple pulmonary nodules bilaterally, including a dominant irregular right upper lobe lesion which could reflect primary lung cancer. The other nodules appear new from remote cardiac CT, suspicious for metastatic disease. No thoracic adenopathy. 2. Moderate to large hiatal hernia. 3. Central mass in the right hepatic lobe with associated biliary dilatation again noted as seen on recent abdominal studies. 4. Based on the patient's prior studies, it remains uncertain as to what the primary malignancy is in this case. 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 Electronically Signed   By: WRichardean SaleM.D.   On: 06/26/2018 09:18   Mr Lumbar Spine  W/o Contrast  Addendum Date: 06/21/2018   ADDENDUM REPORT: 06/21/2018 13:25 ADDENDUM: The new abnormal signal in the posterior L3 vertebral body could also represent a small infarct. Correlation with clinical symptoms and inflammatory markers is still recommended to exclude osteomyelitis. Electronically Signed   By: Titus Dubin M.D.   On: 06/21/2018 13:25   Result Date: 06/21/2018 CLINICAL DATA:   Low back pain radiating to the right buttock and leg to the knee for the past 3 months. Bilateral leg numbness and weakness. Recent surgery in February. EXAM: MRI LUMBAR SPINE WITHOUT CONTRAST TECHNIQUE: Multiplanar, multisequence MR imaging of the lumbar spine was performed. No intravenous contrast was administered. COMPARISON:  MRI lumbar spine dated March 31, 2018. FINDINGS: Segmentation:  Unchanged partial sacralization of L5 on the left. Alignment:  Unchanged 4 mm anterolisthesis at L4-L5. Vertebrae: No fracture. New focal, rounded 2.3 cm area of slightly T2 hypointense, T1 hypointense, slightly stir hyperintense marrow signal in the posterior L3 vertebral body with a curvilinear rim of T2/STIR hyperintensity. Conus medullaris and cauda equina: Conus extends to the T12-L1 level. Conus and cauda equina appear normal. Paraspinal and other soft tissues: Paraspinous postsurgical changes centered at L3-L4 with loculated 2.8 x 1.4 x 7.9 cm fluid collection. No epidural fluid collection. Disc levels: T12-L1: Unchanged mild disc bulging and mild bilateral facet arthropathy. Unchanged mild right neuroforaminal stenosis. No spinal canal or left neuroforaminal stenosis. L1-L2: Unchanged small shallow broad-based posterior disc protrusion and mild bilateral facet arthropathy with ligamentum flavum hypertrophy resulting and moderate spinal canal and mild bilateral neuroforaminal stenosis. L2-L3: Prior posterior decompression. Unchanged small shallow broad-based posterior disc protrusion and mild bilateral facet arthropathy. Unchanged moderate bilateral lateral recess and neuroforaminal stenosis. No spinal canal stenosis. L3-L4: Prior posterior decompression with interval bilateral partial facetectomy. Unchanged small broad-based posterior disc protrusion and severe bilateral facet arthropathy. Resolved spinal canal stenosis. Residual mild bilateral lateral recess stenosis and moderate bilateral neuroforaminal stenosis.  L4-L5: Unchanged mild disc uncovering and disc bulging with prior bilateral facet joint ankylosis. Unchanged mild bilateral lateral recess and neuroforaminal stenosis. No spinal canal stenosis. L5-S1: Negative disc. Unchanged moderate right and mild left facet arthropathy. No stenosis. IMPRESSION: 1. New focal, rounded 2.3 cm area of abnormal signal in the posterior L3 vertebral body, most concerning for osteomyelitis given recent surgery. The appearance could be seen with a metastatic lesion such as prostate cancer, although this is thought to be less likely given the normal marrow signal 3 months ago. 2. Interval repeat posterior decompression with new bilateral partial facetectomies at L3-L4. Resolved spinal canal stenosis. There is residual mild bilateral lateral recess stenosis and moderate bilateral neuroforaminal stenosis. 3. Unchanged moderate spinal canal stenosis at L1-L2. 4. Prior remote posterior decompression at L2-L3 with unchanged moderate bilateral lateral recess and neuroforaminal stenosis. 5. Paraspinous postsurgical changes centered at L3-L4 with loculated 2.8 x 1.4 x 7.9 cm fluid collection, likely a seroma. These results will be called to the ordering clinician or representative by the Radiologist Assistant, and communication documented in the PACS or zVision Dashboard. Electronically Signed: By: Titus Dubin M.D. On: 06/21/2018 10:10   Mr Pelvis W Wo Contrast  Result Date: 06/25/2018 CLINICAL DATA:  Soft tissue mass around the right ischium. EXAM: MRI PELVIS WITHOUT AND WITH CONTRAST TECHNIQUE: Multiplanar multisequence MR imaging of the pelvis was performed both before and after administration of intravenous contrast. CONTRAST:  10 cc Gadavist COMPARISON:  CT scan of the abdomen and pelvis dated 06/24/2018 and lumbar MRI dated 06/20/2018 FINDINGS: Musculoskeletal:  There is a 13 x 10 x 10 cm mass which appears to arise from the right ischium extending into the adjacent soft tissues. The  tumor extends into the posterior aspect of the right acetabulum. There is a pathologic fracture at the right ischial tuberosity. The prior CT scan demonstrates scattered small calcifications within this mass. The mass extends into the right adductor muscles and into the gluteal muscles. The right sciatic nerve should be affected. The right internal obturator muscle is affected. The mass extends toward the rectum and prostate gland. There is a 9 mm metastatic lesion in the left sacral ala as well as a 16 mm metastatic lesion in the right iliac crest. There is circumferential enhancement of these bone lesions similar to the appearance of the lesion in the L3 vertebral body on the MRI of the lumbar spine dated 06/20/2018. There is marked edema in the muscles of the right buttock and proximal thigh as well as edema in the retroperitoneal areas of the pelvis and in the left buttock. 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 Urinary Tract: Foley catheter in place. Small amount of air in the decompressed bladder. Bowel: Diverticulosis of the distal descending and proximal sigmoid portions of the colon. Vascular/Lymphatic: Distention of the femoral and iliac veins consistent with proximal IVC obstruction. Reproductive:  No mass or other significant abnormality Other: There is subcutaneous edema in the pelvis, most prominent on the right. IMPRESSION: 1. 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 This hip mass would be the most accessible lesion for percutaneous needle biopsy for diagnosis, if necessary. 2. Pathologic fracture of the right ischium. 3. Metastatic lesions in the sacrum and right ilium. 4. 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 Electronically Signed   By: Lorriane Shire M.D.   On: 06/25/2018 13:54   Nm Bone Scan Whole Body  Result Date: 06/25/2018 CLINICAL DATA:  Suspected postoperative (lumbar decompression at L3-4) infection. Surgery date was 05/10/2018. 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 Operative findings showed severe stenosis and postoperatively had improvement in his leg symptoms. Postop MRI scan was obtained that showed fluid collection multiloculated and area and L3 vertebral body suggestive of osteomyelitis. MRI scan 03/31/2018 showed no abnormality of the L3 vertebral body. Additionally patient has history of coronary artery disease, diabetes type 2, depression. History of chronic interstitial cystitis. Previous left hip fracture dyslipidemia, post cervical fusion and back pain that radiates to his coccyx EXAM: NUCLEAR MEDICINE WHOLE BODY BONE SCAN TECHNIQUE: Whole body anterior and posterior images were obtained approximately 3 hours after intravenous injection of radiopharmaceutical. RADIOPHARMACEUTICALS:  22.0 mCi Technetium-49mMDP IV COMPARISON:  CT, 06/24/2018. FINDINGS: Right hip and inferior ischium uptake, most evident posteriorly, corresponding to the fractures and surrounding 12 cm soft tissue mass noted on the recent prior CT. There is focal uptake in the proximal right humeral shaft. Degenerative type uptake is noted throughout the spine and at the sternoclavicular joints. There is uptake surrounding bilateral knee prostheses, greater on the right, consistent with reactive uptake. There is uptake noted at the right elbow, incompletely imaged. Renal uptake is  symmetric. IMPRESSION: 1. Right hip and ischium uptake. This corresponds to fractures noted on the recent CT, where there was a surrounding soft tissue mass. There is also focal  uptake in the proximal right humeral shaft. This additional area of uptake is concerning for metastatic disease. 2. No other evidence of metastatic disease. 3. There are other areas of degenerative and reactive uptake as detailed. Electronically Signed   By: Lajean Manes M.D.   On: 06/25/2018 14:38   Ct Abdomen Pelvis W Contrast  Result Date: 06/24/2018 CLINICAL DATA:  New L3 lesion on lumbar MRI, suspicious for metastatic disease. No known malignancy. EXAM: CT ABDOMEN AND PELVIS WITH CONTRAST TECHNIQUE: Multidetector CT imaging of the abdomen and pelvis was performed using the standard protocol following bolus administration of intravenous contrast. CONTRAST:  159m OMNIPAQUE IOHEXOL 300 MG/ML  SOLN COMPARISON:  Abdominopelvic CT 04/30/2009.  Lumbar MRI 06/20/2018. FINDINGS: Lower chest: Trace right pleural effusion with mild bibasilar atelectasis. There is a moderate size hiatal hernia which has mildly enlarged compared with the prior CT. Aortic and coronary artery atherosclerosis noted. Hepatobiliary: There are no underlying morphologic changes of cirrhosis. There is new volume loss in the posterior segment of the right hepatic lobe with associated intrahepatic biliary dilatation. There is an ill-defined low-density mass centrally in the right hepatic lobe, measuring approximately 3.5 x 2.6 cm on image 16/3. There are possible additional lesions in the right hepatic lobe measuring 15 mm on image 16/3 and 25/3. Low-density anteriorly near the falciform ligament on image 29/3 likely represents focal fat. No significant extrahepatic biliary dilatation post cholecystectomy. Pancreas: Unremarkable. No pancreatic ductal dilatation or surrounding inflammatory changes. Spleen: Normal in size without focal abnormality. Adrenals/Urinary Tract: Both adrenal glands appear normal. No focal renal mass or hydronephrosis identified. There is perinephric soft tissue stranding bilaterally which has mildly progressed compared  with the remote CT. The bladder is decompressed by a Foley catheter. There is some air in the bladder lumen. Stomach/Bowel: No evidence of bowel wall thickening, distention or surrounding inflammatory change. Postsurgical changes in the right colon status post apparent ileocolonic anastomosis. There are sigmoid colon diverticular changes. Vascular/Lymphatic: There are no enlarged abdominal or pelvic lymph nodes. Mild aortic and branch vessel atherosclerosis. There is extrinsic mass effect on the intrahepatic portions of the IVC and right portal vein by the process in the right hepatic lobe. No large vessel occlusion identified. Reproductive: The prostate gland and seminal vesicles appear normal. Other: Soft tissue stranding throughout the perinephric and pelvic fat with a small amount of ascites. No focal fluid collection. Musculoskeletal: Status post left proximal femoral ORIF. There is a nondisplaced, mildly comminuted fracture of the right acetabulum and ischium, best seen on the reformatted images. There is a surrounding heterogeneous soft tissue mass which measures up to 11.5 x 9.7 cm on image 90/3. Some of this may reflect hematoma, although is suspicious for neoplasm. There is a component extending medially along the pelvic sidewall. The lesion involving the posterosuperior aspect of the L3 vertebral body on MRI is not well visualized. There is a probable small lytic lesion in the right inferior pubic ramus, best seen on coronal image 88/6. There are postsurgical changes in the lower lumbar spine. IMPRESSION: 1. Although the L3 lesion seen on MRI is not well seen by CT, there is an apparent partially calcified soft tissue mass associated with the right acetabulum and ischium with an underlying pathologic fracture. Some of the soft tissue component could be due to hematoma, although the  appearance is suspicious for underlying neoplasm, possibly metastatic disease or myeloma. 2. In addition, there is suspicion  of an ill-defined, mass centrally in the right hepatic lobe with associated volume loss and intrahepatic biliary dilatation. There are possible additional lesions as well. The liver findings may also reflect metastatic disease or cholangiocarcinoma. No morphologic changes of cirrhosis. 3. Tissue sampling recommended, likely easiest at the ischial soft tissue mass. Electronically Signed   By: Richardean Sale M.D.   On: 06/24/2018 15:04   Mr Liver W Wo Contrast  Result Date: 06/25/2018 CLINICAL DATA:  Right pelvic mass and liver lesions on recent CT scan. EXAM: MRI ABDOMEN WITHOUT AND WITH CONTRAST TECHNIQUE: Multiplanar multisequence MR imaging of the abdomen was performed both before and after the administration of intravenous contrast. CONTRAST:  10 cc Gadavist COMPARISON:  CT scan 06/24/2018 FINDINGS: Lower chest: Large hiatal hernia. Hepatobiliary: Infiltrating heterogeneously enhancing mass lesion is identified in the medial right liver, mainly involving segment VII with some extension into the posterior aspect of segment VIII. The lesion measures approximately 6.2 x 5.0 x 6.0 cm and invades the intrahepatic IVC. Tumor in the IVC markedly narrows and probably obliterates the lumen of the intrahepatic segment of the IVC. Thrombus is identified in the left renal vein tracking into the infra hepatic IVC up to the level of the tumor invasion (see coronal postcontrast images 36-45 of series 26). Intrahepatic biliary duct dilatation is identified in the posterior right liver with abrupt cut off of the dilated ducts at the periphery of the mass. No evidence for left hepatic biliary dilatation. Portal vein in the posterior right hepatic lobe is attenuated, but no thrombus evident in the main portal vein. Multiple hypervascular satellite lesions are highly suspicious for metastases. 3 of these lesions are identified in the right hepatic lobe and are well demonstrated on early postcontrast axial subtraction imaging  (see images 22, 23, and 36 of series 19). Gallbladder surgically absent. Extrahepatic common duct is nondilated at 6 mm diameter. Common bile duct in the head of the pancreas is 9 dilated at 6 mm diameter. Pancreas: No focal mass lesion. No dilatation of the main duct. No intraparenchymal cyst. No peripancreatic edema. Spleen:  No splenomegaly. No focal mass lesion. Adrenals/Urinary Tract: No adrenal nodule or mass. No hydronephrosis. No suspicious enhancing lesion in either kidney. Stomach/Bowel: Large hiatal hernia. Duodenum is normally positioned as is the ligament of Treitz. No small bowel or colonic dilatation within the visualized abdomen. Vascular/Lymphatic: Left renal vein and IVC thrombus evident and described above in the hepatobiliary section of the report. There is no gastrohepatic or hepatoduodenal ligament lymphadenopathy. No intraperitoneal or retroperitoneal lymphadenopathy. Other: Extensive edema is identified around the right liver, IVC, and in the retroperitoneal space bilaterally. Musculoskeletal: Focal enhancement identified in the posterior aspect of a lumbar vertebral body, probably L3. This is in close proximity to the site of recent surgery. IMPRESSION: 1. 6 cm heterogeneously enhancing mass in the medial right liver obliterates intrahepatic bile ducts of the posterior right liver. Tumor invades the IVC, markedly narrowing and probably obliterating the lumen. IVC thrombus is identified in the infra hepatic segment extending into the left renal vein, a finding that is new since the CT scan of yesterday. Intrahepatic cholangiocarcinoma would be a distinct consideration. Hepatocellular carcinoma and, less commonly, metastatic disease can result in IVC invasion. 2. Hypervascular satellite lesions around the dominant tumor are compatible with metastatic involvement. 3. Fairly extensive edema identified around the IVC and in the retroperitoneal space  bilaterally. 4. Hypervascular lumbar spine  lesion corresponds to the focus of abnormal marrow signal seen at L3 on recent dedicated lumbar spine MRI. These results will be called to the ordering clinician or representative by the Radiologist Assistant, and communication documented in the PACS or zVision Dashboard. Electronically Signed   By: Misty Stanley M.D.   On: 06/25/2018 16:11   Ir US Guide Bx Asp/drain  Result Date: 06/21/2018 INDICATION: Indeterminate fluid collection within the midline of the low back. Please perform ultrasound-guided aspiration for diagnostic purposes. Poor venous access. Please perform PICC line placement for long-term antibiotic administration. EXAM: 1. ULTRASOUND AND FLUOROSCOPIC GUIDED PICC LINE INSERTION 2. ULTRASOUND GUIDED ASPIRATION ILL-DEFINED FLUID COLLECTION WITHIN MIDLINE OF LOW BACK COMPARISON:  Lumbar spine MRI - 06/20/2018 MEDICATIONS: None. Anesthesia/sedation: Moderate (conscious) sedation was employed during this procedure. A total of Fentanyl 50 mcg and Dilaudid 1 mg was administered intravenously. Moderate Sedation Time: 33 minutes. The patient's level of consciousness and vital signs were monitored continuously by radiology nursing throughout the procedure under my direct supervision. CONTRAST:  None FLUOROSCOPY TIME:  36 seconds (2.3 mGy) COMPLICATIONS: None immediate. TECHNIQUE: The procedure, risks, benefits, and alternatives were explained to the patient and informed written consent was obtained. A timeout was performed prior to the initiation of the procedure. The right upper extremity was prepped with chlorhexidine in a sterile fashion, and a sterile drape was applied covering the operative field. Maximum barrier sterile technique with sterile gowns and gloves were used for the procedure. A timeout was performed prior to the initiation of the procedure. Local anesthesia was provided with 1% lidocaine. Under direct ultrasound guidance, the basilic vein was accessed with a micropuncture kit after the  overlying soft tissues were anesthetized with 1% lidocaine. After the overlying soft tissues were anesthetized, a small venotomy incision was created and a micropuncture kit was utilized to access the right basilic vein. Real-time ultrasound guidance was utilized for vascular access including the acquisition of a permanent ultrasound image documenting patency of the accessed vessel. A guidewire was advanced to the level of the superior caval-atrial junction for measurement purposes and the PICC line was cut to length. A peel-away sheath was placed and a 40 cm, 5 [place], dual lumen was inserted to level of the superior caval-atrial junction. A post procedure spot fluoroscopic was obtained. The catheter easily aspirated and flushed and was sutured in place. A dressing was placed. _________________________________________________________ Attention was now paid towards aspiration of the ill-defined fluid collection within the midline of the low back demonstrated on preceding lumbar spine MRI. Sonographic evaluation of the soft tissues subjacent to the well-healed surgical incision within the midline of the low back demonstrated an ill-defined fluid collection correlating with ill-defined fluid collection demonstrated on preceding lumbar spine MRI As such, the overlying soft tissues were prepped and draped in usual sterile fashion. After the overlying soft tissues were anesthetized with 1% lidocaine, an 18 gauge trocar needle was advanced into the dominant ill-defined component of the collection. An ultrasound image was saved for procedural documentation purposes Next, approximately 2 cc of serous, slightly blood tinged fluid was aspirated as the needle was slowly retracted. All aspirated fluid was capped and sent to the laboratory for analysis. A dressing was placed. The patient tolerated the above procedures well without immediate postprocedural complication. FINDINGS: After catheter placement, the tip lies within the  superior cavoatrial junction. The catheter aspirates and flushes normally and is ready for immediate use. Technically successful ultrasound-guided aspiration of 2 cc  of serous, slightly blood tinged fluid from the ill-defined serpiginous fluid collection within the midline of the low back, subjacent to the well-healed midline surgical incision, correlating with the ill-defined fluid collection demonstrated on preceding lumbar spine MRI. IMPRESSION: 1. Successful ultrasound and fluoroscopic guided placement of a right basilic vein approach, 40 cm, 5 [place], dual lumen PICC with tip at the superior caval-atrial junction. The PICC line is ready for immediate use. 2. Successful ultrasound-guided aspiration of approximately 2 cc of serous, slightly blood tinged fluid from the ill-defined fluid collection within the midline of the low back. All aspirated fluid was capped and sent to the laboratory for analysis. Note, the fluid collection is currently too small to allow for percutaneous drainage catheter placement. Electronically Signed   By: Sandi Mariscal M.D.   On: 06/21/2018 15:30   Portable Chest 1 View  Result Date: 06/21/2018 CLINICAL DATA:  Preoperative study.  Recent PICC line placement. EXAM: PORTABLE CHEST 1 VIEW COMPARISON:  Chest x-ray dated May 03, 2018. FINDINGS: Interval placement of a right upper extremity PICC line with the tip at the cavoatrial junction. The heart size and mediastinal contours are within normal limits. Prior CABG. Atherosclerotic calcification of the aortic arch. Normal pulmonary vascularity. No focal consolidation, pleural effusion, or pneumothorax. Unchanged calcified granuloma in the right upper lobe. No acute osseous abnormality. Unchanged moderate to large hiatal hernia. IMPRESSION: No active disease. Electronically Signed   By: Titus Dubin M.D.   On: 06/21/2018 16:58   Ir Picc Placement Right >5 Yrs Inc Img Guide  Result Date: 06/21/2018 INDICATION: Indeterminate fluid  collection within the midline of the low back. Please perform ultrasound-guided aspiration for diagnostic purposes. Poor venous access. Please perform PICC line placement for long-term antibiotic administration. EXAM: 1. ULTRASOUND AND FLUOROSCOPIC GUIDED PICC LINE INSERTION 2. ULTRASOUND GUIDED ASPIRATION ILL-DEFINED FLUID COLLECTION WITHIN MIDLINE OF LOW BACK COMPARISON:  Lumbar spine MRI - 06/20/2018 MEDICATIONS: None. Anesthesia/sedation: Moderate (conscious) sedation was employed during this procedure. A total of Fentanyl 50 mcg and Dilaudid 1 mg was administered intravenously. Moderate Sedation Time: 33 minutes. The patient's level of consciousness and vital signs were monitored continuously by radiology nursing throughout the procedure under my direct supervision. CONTRAST:  None FLUOROSCOPY TIME:  36 seconds (2.3 mGy) COMPLICATIONS: None immediate. TECHNIQUE: The procedure, risks, benefits, and alternatives were explained to the patient and informed written consent was obtained. A timeout was performed prior to the initiation of the procedure. The right upper extremity was prepped with chlorhexidine in a sterile fashion, and a sterile drape was applied covering the operative field. Maximum barrier sterile technique with sterile gowns and gloves were used for the procedure. A timeout was performed prior to the initiation of the procedure. Local anesthesia was provided with 1% lidocaine. Under direct ultrasound guidance, the basilic vein was accessed with a micropuncture kit after the overlying soft tissues were anesthetized with 1% lidocaine. After the overlying soft tissues were anesthetized, a small venotomy incision was created and a micropuncture kit was utilized to access the right basilic vein. Real-time ultrasound guidance was utilized for vascular access including the acquisition of a permanent ultrasound image documenting patency of the accessed vessel. A guidewire was advanced to the level of the  superior caval-atrial junction for measurement purposes and the PICC line was cut to length. A peel-away sheath was placed and a 40 cm, 5 [place], dual lumen was inserted to level of the superior caval-atrial junction. A post procedure spot fluoroscopic was obtained. The catheter  easily aspirated and flushed and was sutured in place. A dressing was placed. _________________________________________________________ Attention was now paid towards aspiration of the ill-defined fluid collection within the midline of the low back demonstrated on preceding lumbar spine MRI. Sonographic evaluation of the soft tissues subjacent to the well-healed surgical incision within the midline of the low back demonstrated an ill-defined fluid collection correlating with ill-defined fluid collection demonstrated on preceding lumbar spine MRI As such, the overlying soft tissues were prepped and draped in usual sterile fashion. After the overlying soft tissues were anesthetized with 1% lidocaine, an 18 gauge trocar needle was advanced into the dominant ill-defined component of the collection. An ultrasound image was saved for procedural documentation purposes Next, approximately 2 cc of serous, slightly blood tinged fluid was aspirated as the needle was slowly retracted. All aspirated fluid was capped and sent to the laboratory for analysis. A dressing was placed. The patient tolerated the above procedures well without immediate postprocedural complication. FINDINGS: After catheter placement, the tip lies within the superior cavoatrial junction. The catheter aspirates and flushes normally and is ready for immediate use. Technically successful ultrasound-guided aspiration of 2 cc of serous, slightly blood tinged fluid from the ill-defined serpiginous fluid collection within the midline of the low back, subjacent to the well-healed midline surgical incision, correlating with the ill-defined fluid collection demonstrated on preceding lumbar  spine MRI. IMPRESSION: 1. Successful ultrasound and fluoroscopic guided placement of a right basilic vein approach, 40 cm, 5 French, dual lumen PICC with tip at the superior caval-atrial junction. The PICC line is ready for immediate use. 2. Successful ultrasound-guided aspiration of approximately 2 cc of serous, slightly blood tinged fluid from the ill-defined fluid collection within the midline of the low back. All aspirated fluid was capped and sent to the laboratory for analysis. Note, the fluid collection is currently too small to allow for percutaneous drainage catheter placement. Electronically Signed   By: Sandi Mariscal M.D.   On: 06/21/2018 15:30    Labs:  CBC: Recent Labs    06/21/18 1642 06/22/18 0320 06/26/18 0523 06/27/18 0655  WBC 7.2 6.5 11.6* 13.7*  HGB 11.6* 10.7* 12.4* 12.3*  HCT 34.2* 33.4* 37.6* 36.3*  PLT 226 225 288 300    COAGS: Recent Labs    06/21/18 1323  INR 1.1    BMP: Recent Labs    05/03/18 1535 06/21/18 1642 06/22/18 0320 06/25/18 0851 06/27/18 0655  NA 138 140 137  --  127*  K 4.1 4.3 4.0  --  4.6  CL 106 111 107  --  95*  CO2 20* 23 22  --  18*  GLUCOSE 108* 119* 124*  --  149*  BUN _0 --  49*  CALCIUM 9.8 9.2 9.0  --  8.8*  CREATININE 1.15 0.96 0.97 0.85 1.94*  GFRNONAA 59* >60 >60 >60 31*  GFRAA >60 >60 >60 >60 36*    LIVER FUNCTION TESTS: Recent Labs    05/03/18 1535 06/21/18 1642 06/27/18 0655  BILITOT 0.9 0.9 1.2  AST 39 43* 43*  ALT 34 43 45*  ALKPHOS 221* 412* 508*  PROT 7.1 5.9* 6.2*  ALBUMIN 3.9 3.2* 2.7*    TUMOR MARKERS: No results for input(s): AFPTM, CEA, CA199, CHROMGRNA in the last 8760 hours.  Assessment and Plan: Pelvic mass Patient with widespread metastatic cancer of unknown primary.  Request is made for biopsy.  Heparin held this morning.  He has been NPO.  Case reviewed and approved by  Dr. Vernard Gambles.  Plan to proceed with biopsy in CT today.   Risks and benefits of biopsy was discussed with  the patient and/or patient's family including, but not limited to bleeding, infection, damage to adjacent structures or low yield requiring additional tests.  All of the questions were answered and there is agreement to proceed.  Consent signed and in chart.  Thank you for this interesting consult.  I greatly enjoyed meeting DEKKER VERGA and look forward to participating in their care.  A copy of this report was sent to the requesting provider on this date.  Electronically Signed: Docia Barrier, PA 06/27/2018, 11:30 AM   I spent a total of 40 Minutes    in face to face in clinical consultation, greater than 50% of which was counseling/coordinating care for right pelvic mass.

## 2018-06-27 NOTE — Evaluation (Signed)
Physical Therapy Evaluation Patient Details Name: Lucas Morales MRN: [id number] DOB: April 30, 1933 Today's Date: 06/27/2018   History of Present Illness  This is an 85 year old male with a past medical history including hypertension, depression and anxiety, coronary artery disease, chronic interstitial cystitis, previous left hip fracture, previous cervical fusion, hyperlipidemia, restless leg syndrome, diabetes, arthritis, alpha 1 antitrypsin deficiency carrier, anemia, chronic back pain who was admitted for suspected postoperative lumbar decompression at L3-4 infection.  Surgery date was 05/10/2018. MRI suspicious for osteomyelitis. CT scan revealed R medial acetabular fx as well as lesions on liver suspicious for cancer.   Clinical Impression  PTA pt was lived alone in 2 story home with 2 steps to enter and was independent in household mobility with a cane and able to perform ADLs. Pt is currently limited in safe mobility by pain in back and LE as well as decreased strength and endurance with poor insight on his level of deconditioning and inability to safely mobilize without assistance. Pt currently is maxAx2 for bed mobility and modAx2 for transfers and short distance ambulation with RW. Given pt current level of mobility and the fact that he lives alone, he will require SNF level rehab before going home. Pt is very opposed to this course of action, and will likely refuse. PT educated pt on need for 24 hour assist and discussed that he will need to secure assistance before d/c home. PT recommends SNF at this time.      Follow Up Recommendations SNF    Equipment Recommendations  Other (comment)(TBD at next venue)       Precautions / Restrictions Precautions Precautions: Fall Restrictions Weight Bearing Restrictions: Yes RLE Weight Bearing: Weight bearing as tolerated      Mobility  Bed Mobility Overal bed mobility: Needs Assistance Bed Mobility: Rolling;Sidelying to Sit Rolling: Max  assist Sidelying to sit: Max assist;HOB elevated       General bed mobility comments: using BLEs for stability  Transfers Overall transfer level: Needs assistance Equipment used: Rolling walker (2 wheeled) Transfers: Sit to/from [facility] Sit to Stand: Mod assist;+2 physical assistance;+2 safety/equipment Stand pivot transfers: Mod assist;+2 physical assistance;+2 safety/equipment       General transfer comment: assist for lines and pt fatigues quickly requiring +2 for chair follow and stability. Pt impulsive.  Ambulation/Gait Ambulation/Gait assistance: Mod assist;+2 safety/equipment Gait Distance (Feet): 30 Feet(1x10, 1x20) Assistive device: Rolling walker (2 wheeled) Gait Pattern/deviations: Step-through pattern;Decreased step length - right;Decreased step length - left;Trunk flexed;Shuffle Gait velocity: slowed Gait velocity interpretation: <1.31 ft/sec, indicative of household ambulator General Gait Details: modA for support with ambulation, pt unable to fully extend LE,and come to fully upright, would prefer if therapist would allow him to utilize momentum for propulsion, requires close chair follow sue to instability and fatigue      Balance Overall balance assessment: Needs assistance   Sitting balance-Leahy Scale: Fair       Standing balance-Leahy Scale: Poor                               Pertinent Vitals/Pain Pain Assessment: Faces Faces Pain Scale: Hurts even more Pain Location: back and BLEs    Home Living Family/patient expects to be discharged to:: Private residence Living Arrangements: Alone Available Help at Discharge: Family;Available PRN/intermittently(trying to stay out of contact w/ daughter) Type of Home: House Home Access: Stairs to enter Entrance Stairs-Rails: Psychiatric nurse of Steps: 2 Home Layout:  Multi-level;Laundry or work area in Metallurgist,) Home Equipment: Environmental consultant - 2  wheels;Crutches;Bedside commode;Adaptive equipment      Prior Function Level of Independence: Independent with assistive device(s)         Comments: walking with SPC in home; IADLs provided by friends     Hand Dominance   Dominant Hand: Right    Extremity/Trunk Assessment   Upper Extremity Assessment Upper Extremity Assessment: Defer to OT evaluation    Lower Extremity Assessment Lower Extremity Assessment: RLE deficits/detail;LLE deficits/detail RLE Deficits / Details: s/p biopsy of R ischium, experiencing increased pain, Decreased hip and knee ROM lacking full extension of both, ankle lacks dorsiflexion  RLE: Unable to fully assess due to pain LLE Deficits / Details: Decreased hip and knee ROM lacking full extension of both, ankle lacks dorsiflexion  LLE: Unable to fully assess due to pain    Cervical / Trunk Assessment Cervical / Trunk Assessment: Normal  Communication   Communication: No difficulties  Cognition Arousal/Alertness: Awake/alert Behavior During Therapy: WFL for tasks assessed/performed Overall Cognitive Status: Within Functional Limits for tasks assessed                                 General Comments: decreased safety awareness with unrealistic ideas of how long he will be able to stay in hospital before going home      General Comments General comments (skin integrity, edema, etc.): max HR with ambulation 127 bpm,         Assessment/Plan    PT Assessment Patient needs continued PT services  PT Problem List Decreased strength;Decreased range of motion;Decreased activity tolerance;Decreased balance;Decreased mobility;Decreased cognition;Decreased coordination;Decreased safety awareness;Cardiopulmonary status limiting activity;Pain       PT Treatment Interventions DME instruction;Gait training;Functional mobility training;Therapeutic activities;Therapeutic exercise;Balance training;Cognitive remediation;Patient/family education     PT Goals (Current goals can be found in the Care Plan section)  Acute Rehab PT Goals Patient Stated Goal: to go home PT Goal Formulation: With patient Time For Goal Achievement: 07/11/18 Potential to Achieve Goals: Fair    Frequency Min 3X/week   Barriers to discharge Decreased caregiver support;Inaccessible home environment         AM-PAC PT "6 Clicks" Mobility  Outcome Measure Help needed turning from your back to your side while in a flat bed without using bedrails?: A Lot Help needed moving from lying on your back to sitting on the side of a flat bed without using bedrails?: Total Help needed moving to and from a bed to a chair (including a wheelchair)?: A Lot Help needed standing up from a chair using your arms (e.g., wheelchair or bedside chair)?: A Lot Help needed to walk in hospital room?: A Lot Help needed climbing 3-5 steps with a railing? : Total 6 Click Score: 10    End of Session Equipment Utilized During Treatment: Gait belt Activity Tolerance: Patient limited by pain;Patient limited by fatigue Patient left: in chair;with call bell/phone within reach;with chair alarm set Nurse Communication: Mobility status;Need for lift equipment;Precautions PT Visit Diagnosis: Unsteadiness on feet (R26.81);Other abnormalities of gait and mobility (R26.89);Muscle weakness (generalized) (M62.81);Difficulty in walking, not elsewhere classified (R26.2);Pain Pain - part of body: (back and LE R>L)    Time: 4403-4742 PT Time Calculation (min) (ACUTE ONLY): 43 min   Charges:   PT Evaluation $PT Eval Moderate Complexity: 1 Mod PT Treatments $Gait Training: 8-22 mins        Bailey Mech  Fleet PT, DPT Acute Rehabilitation Services Pager [phone] Office [phone]   [place] 06/27/2018, 5:35 PM

## 2018-06-27 NOTE — Social Work (Addendum)
11:44am- Pt has spoken with PMT, he would like for [place] to help him avoid SNF. Will follow for therapy notes.  10:30am- Pt needs to be agreeable to work with therapy in order to d/c to SNF- at current time pt has refused therapy multiple times. Aware pt weak and likely in need of SNF but will need therapy evals for insurance authorization purposes and evaluation of current condition.    Westley Hummer, MSW, [place] Work [phone]

## 2018-06-27 NOTE — Consult Note (Addendum)
Consultation Note Date: 06/27/2018   Patient Name: Lucas Morales  DOB: [birth year]  MRN: [id number]  Age / Sex: 85 y.o., male  PCP: Deland Pretty, MD Referring Physician: [name], MD  Reason for Consultation: Non pain symptom management, Pain control and Psychosocial/spiritual support  HPI/Patient Profile: 85 y.o. male  with past medical history of CAD, Afib, alpha 1 antitrypsin deficiency, interstitial cystitis (with a tendency to have urinary retention), DM, depression and anxiety who was admitted on 06/21/2018 with what was thought to be a post op infection at L3-L4.  Work up has revealed widespread (lungs, liver, pelvis, bone) metastatic cancer of undetermined primary.  Lucas Morales is going for a biopsy this morning.   PMT has been asked to consult for symptom management.  Clinical Assessment and Goals of Care:  I have reviewed medical records including EPIC notes, labs and imaging, received report from the care team, assessed the patient and talked with him at the bedside to discuss diagnosis prognosis, GOC, EOL wishes, disposition and options.  I introduced Palliative Medicine as specialized medical care for people living with serious illness. It focuses on providing relief from the symptoms and stress of a serious illness. The goal is to improve quality of life for both the patient and the family.  We discussed a brief life review of the patient. Lucas Morales is quite an accomplished man.  He has been a professor at [employer], [employer], [employer].  He served as Recruitment consultant.  His expertise appears to be in the field of communications.  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  Now his belief system is based on the writing of Jamal Maes.  He has 1 son ([id number]) and 1 daughter.  He  is twice divorced.   As far as functional status he was living at home alone and independent.  I attempted to elicit values and goals of care important to the patient.  Lucas Morales has a living will.  He does not want any extraordinary measures taken should he arrest but he does want full scope treatment as long as he is breathing or has a pulse.  He explains that he is not afraid of death.  He needs to buy some time at a reduced pain level - he's hopeful for at least 6 months to finish some on-going projects including a book he has written and a play.   He states that he also needs to update his Will - which will take him approximately 10 days.  We discussed his pain.  He frequently has urinary retention which causes him great pain.  A foley catheter helps this pain a great deal.  He has significant pain deep in his right buttock.  This pain is constant.  It was helped some by the oxycodone originally prescribed by Dr. Lorin Mercy however, Lucas Morales feels he has developed a tolerance for the oxy.  He  explains that his metabolism is unusual - he is not usually affected by alcohol or marijuana.  The 5 or 10 mg oxy PRN he has received is not helping.  Questions and concerns were addressed.   PMT will follow up tomorrow.  ADDENDUM: I spent time on the phone with [name] (daughter).  We discussed her father's desire to have private in home care.  Care giving responsibilities and her father's long term suffering.    Primary Decision Maker:  PATIENT    SUMMARY OF RECOMMENDATIONS    1.  Goal is to reduce pain and live as long as possible with a reasonable pain level.    2.  Monitor for urinary retention - foley catheter is helping now.  3.  Agree with steroids which Dr. Marin Olp has started.  4.  Will start long acting opioid with short acting for break thru pain.  5.  Will add a low dose benzodiazepine for QHS  6.  Change miralax to daily.  7.  Code status changed to DNR with full scope  treatment  8.  Chaplain consulted.  9.  Patient wants to avoid SNF.  He is interested in pursuing care in his home.   Will need Full Home Health resources at discharge as well as privately hired resources.  Additional Recommendations (Limitations, Scope, Preferences):  Full Scope Treatment  Palliative Prophylaxis:   Frequent Pain Assessment  Psycho-social/Spiritual:   Desire for further Chaplaincy support: Requested  Prognosis:   Difficult to determine.  He has a very fast growing widely spread cancer.    Discharge Planning: Home with Palliative Services and home health.      Primary Diagnoses: Present on Admission: . Infection of lumbar spine ([place])   I have reviewed the medical record, interviewed the patient and family, and examined the patient. The following aspects are pertinent.  Past Medical History:  Diagnosis Date  . Adenomatous colon polyp   . Alpha-1-antitrypsin deficiency ([place])   . Alpha-1-antitrypsin deficiency carrier   . Anemia   . Anxiety   . Arthritis    "knees; bad in my back" (09/01/2014)  . Bruises easily   . Chronic bronchitis ([place])    "get it pretty much q yr" (09/01/2014)  . Chronic lower back pain   . Coronary artery disease   . Depression    "related to health" (09/01/2014)  . Diarrhea    has had a part of colon removed  . Diverticulitis   . Diverticulosis   . Dyspnea on exertion   . Dysrhythmia    bradycardia in the 30's (01/11/2012).. Afib briefly after CABG  . Esophageal dysmotility   . Esophageal stricture   . Gastritis   . GERD (gastroesophageal reflux disease)    takes Pantoprazole daily  . Gout   . Hemorrhoids   . Hiatal hernia   . History of colon polyps   . Hyperlipidemia    doesn't require meds  . Hypertension    takes Losartan daily; current not taking   . IC (interstitial cystitis)   . Insomnia    takes Nortrypyline nightly  . Joint pain   . Joint swelling   . Macular hole of both eyes   . Nocturia   . OSA  (obstructive sleep apnea)    "mild; tried CPAP; couldn't tolerate" (09/01/2014)  . Paroxysmal atrial fibrillation ([facility])   . Restless leg syndrome   . Sciatic nerve pain   . Type II diabetes mellitus ([place])    "borderline"  .  Urinary frequency    Social History   Socioeconomic History  . Marital status: Divorced    Spouse name: Not on file  . Number of children: 2  . Years of education: Not on file  . Highest education level: Doctorate  Occupational History  . Occupation: Retired    Fish farm manager: RETIRED  Social Needs  . Financial resource strain: Not hard at all  . Food insecurity:    Worry: Never true    Inability: Never true  . Transportation needs:    Medical: No    Non-medical: No  Tobacco Use  . Smoking status: Never Smoker  . Smokeless tobacco: Never Used  Substance and Sexual Activity  . Alcohol use: Yes    Comment: 09/01/2014 "glass of wine a few times/yr; if that"  . Drug use: No    Comment: CDD cream for pain to hip tried recently   . Sexual activity: Yes    Birth control/protection: None  Lifestyle  . Physical activity:    Days per week: Patient refused    Minutes per session: Patient refused  . Stress: Patient refused  Relationships  . Social connections:    Talks on phone: Patient refused    Gets together: Patient refused    Attends religious service: Patient refused    Active member of club or organization: Patient refused    Attends meetings of clubs or organizations: Patient refused    Relationship status: Patient refused  Other Topics Concern  . Not on file  Social History Narrative  . Not on file   Family History  Problem Relation Age of Onset  . Emphysema Mother   . Kidney disease Father   . Emphysema Maternal Grandfather   . Colon cancer Neg Hx    Scheduled Meds: . clonazePAM  0.5 mg Oral QHS  . dexamethasone  20 mg Intravenous Daily  . docusate sodium  100 mg Oral BID  . feeding supplement  1 Container Oral TID BM  . feeding supplement  (PRO-STAT SUGAR FREE 64)  30 mL Oral BID WC  . fluconazole  100 mg Oral Daily  . insulin aspart  0-15 Units Subcutaneous TID WC  . multivitamin with minerals  1 tablet Oral Daily  . oxyCODONE  15 mg Oral Q12H  . pantoprazole  40 mg Oral BID  . polyethylene glycol  17 g Oral Daily  . sodium chloride flush  10-40 mL Intracatheter Q12H  . tamsulosin  0.4 mg Oral Daily   Continuous Infusions: . sodium chloride 75 mL/hr at 06/27/18 0225  . heparin 1,650 Units/hr (06/26/18 2233)   PRN Meds:.alum & mag hydroxide-simeth, menthol-cetylpyridinium, ondansetron **OR** ondansetron (ZOFRAN) IV, oxyCODONE, sodium chloride flush, sodium phosphate Allergies  Allergen Reactions  . Morphine Itching   Review of Systems Complains of pain in right buttock, lack of sleep due to pain, decreased mobility  Physical Exam  Well developed, A&Ox3 with clear speech,  CV rrr resp slightly short of breath when speaking, but CTA Abdomen soft, nt, nd, + bowel sounds Lower ext 2-3+ edema on right  (R>L)  Vital Signs: BP (!) 142/86 (BP Location: Left Arm)   Pulse 100   Temp 98.6 F (37 C) (Oral)   Resp 20   Ht 6\' 2"  (1.88 m)   Wt 101.3 kg   SpO2 96%   BMI 28.67 kg/m  Pain Scale: 0-10 POSS *See Group Information*: 1-Acceptable,Awake and alert Pain Score: 8    SpO2: SpO2: 96 % O2 Device:SpO2: 96 %  O2 Flow Rate: .   IO: Intake/output summary:   Intake/Output Summary (Last 24 hours) at 06/27/2018 1106 Last data filed at 06/27/2018 1040 Gross per 24 hour  Intake -  Output 525 ml  Net -525 ml    LBM: Last BM Date: 06/20/18 Baseline Weight: Weight: 101.3 kg Most recent weight: Weight: 101.3 kg     Palliative Assessment/Data: 40%   Flowsheet Rows     Most Recent Value  Intake Tab  Referral Department  Oncology  Unit at Time of Referral  Med/Surg Unit  Palliative Care Primary Diagnosis  Cancer  Date Notified  06/26/18  Palliative Care Type  New Palliative care  Reason for referral  Non-pain  Symptom  Date of Admission  06/21/18  # of days IP prior to Palliative referral  5  Clinical Assessment  Psychosocial & Spiritual Assessment  Palliative Care Outcomes      Time In: 9:00 Time Out: 10:10 Time Total: 70 min Greater than 50%  of this time was spent counseling and coordinating care related to the above assessment and plan.  Signed by: Florentina Jenny, PA-C Palliative Medicine Pager: [phone]  Please contact Palliative Medicine Team phone at [phone] for questions and concerns.  For individual provider: See Shea Evans

## 2018-06-27 NOTE — Progress Notes (Signed)
LE venous duplex       has been completed. Preliminary results can be found under CV proc through chart review. June Leap, BS, RDMS, RVT    Positive results right calf. Messaged Dr. Lorin Mercy.

## 2018-06-27 NOTE — Evaluation (Signed)
Occupational Therapy Evaluation Patient Details Name: Lucas Morales MRN: [id number] DOB: 10/02/1933 Today's Date: 06/27/2018    History of Present Illness This is an 85 year old male with a past medical history including hypertension, depression and anxiety, coronary artery disease, chronic interstitial cystitis, previous left hip fracture, previous cervical fusion, hyperlipidemia, restless leg syndrome, diabetes, arthritis, alpha 1 antitrypsin deficiency carrier, anemia, chronic back pain who was admitted for suspected postoperative lumbar decompression at L3-4 infection.  Surgery date was 05/10/2018. MRI suspicious for osteomyelitis. CT scan revealed R medial acetabular fx as well as lesions on liver suspicious for cancer.    Clinical Impression   Pt PTA: living alone with friends assisting with IADL. Pt currently performing ADL with increased assist since last OT visit and mobility is poor with b/l knee flexion. Pt modA +2 for mobility- only safe with chair follow. Pt impulsive and does not want to go to SNF, but requires 24/7 caregivers at this time and plans to "work on it." Pt minA for UB and Mod-MaxA for LB ADL. Pt would benefit from continued OT skilled services for ADL, mobility and safety in SNF setting. OT following acutely.    Follow Up Recommendations  SNF;Supervision/Assistance - 24 hour(refusing SNF, but if pt cannot find 24/7 at home)    Equipment Recommendations  3 in 1 bedside commode    Recommendations for Other Services       Precautions / Restrictions Precautions Precautions: Fall Restrictions Weight Bearing Restrictions: Yes RLE Weight Bearing: Weight bearing as tolerated      Mobility Bed Mobility Overal bed mobility: Needs Assistance Bed Mobility: Rolling;Sidelying to Sit Rolling: Max assist Sidelying to sit: Max assist;HOB elevated       General bed mobility comments: using BLEs for stability  Transfers Overall transfer level: Needs  assistance Equipment used: Rolling walker (2 wheeled) Transfers: Sit to/from [employer] Sit to Stand: Mod assist;+2 physical assistance;+2 safety/equipment Stand pivot transfers: Mod assist;+2 physical assistance;+2 safety/equipment       General transfer comment: assist for lines and pt fatigues quickly requiring +2 for chair follow and stability. Pt impulsive.    Balance Overall balance assessment: Needs assistance   Sitting balance-Leahy Scale: Fair       Standing balance-Leahy Scale: Poor                             ADL either performed or assessed with clinical judgement   ADL Overall ADL's : Needs assistance/impaired Eating/Feeding: Set up;Sitting   Grooming: Set up;Sitting   Upper Body Bathing: Minimal assistance;Sitting   Lower Body Bathing: Maximal assistance;Sitting/lateral leans;Sit to/from stand   Upper Body Dressing : Minimal assistance;Sitting   Lower Body Dressing: Maximal assistance;Sitting/lateral leans;Sit to/from stand   Toilet Transfer: Moderate assistance;+2 for physical assistance;+2 for safety/equipment;Regular Toilet;Grab bars   Toileting- Clothing Manipulation and Hygiene: Total assistance;Sitting/lateral lean;Sit to/from stand       Functional mobility during ADLs: Moderate assistance;+2 for physical assistance;+2 for safety/equipment;Rolling walker;Cueing for safety;Cueing for sequencing General ADL Comments: Pt performing ADL with increased assist per chart.     Vision Baseline Vision/History: Wears glasses Vision Assessment?: No apparent visual deficits     Perception     Praxis      Pertinent Vitals/Pain Pain Assessment: Faces Faces Pain Scale: Hurts even more Pain Location: back and BLEs     Hand Dominance Right   Extremity/Trunk Assessment Upper Extremity Assessment Upper Extremity Assessment: Generalized weakness   Lower  Extremity Assessment Lower Extremity Assessment: Generalized  weakness   Cervical / Trunk Assessment Cervical / Trunk Assessment: Normal   Communication Communication Communication: No difficulties   Cognition Arousal/Alertness: Awake/alert Behavior During Therapy: WFL for tasks assessed/performed Overall Cognitive Status: Within Functional Limits for tasks assessed                                     General Comments       Exercises     Shoulder Instructions      Home Living Family/patient expects to be discharged to:: Private residence Living Arrangements: Alone Available Help at Discharge: Family;Available PRN/intermittently(trying to stay out of contact w/ daughter) Type of Home: House Home Access: Stairs to enter [employer] of Steps: 2 Entrance Stairs-Rails: Right;Left Home Layout: Multi-level;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,) Alternate Teacher, music of Steps: flight down- entrance is on upper level   Bathroom Shower/Tub: Producer, television/film/video: Handicapped height Bathroom Accessibility: Yes How Accessible: Accessible via walker Home Equipment: Walker - 2 wheels;Crutches;Bedside commode;Adaptive equipment Adaptive Equipment: Reacher        Prior Functioning/Environment Level of Independence: Independent with assistive device(s)        Comments: walking with SPC in home; IADLs provided by friends        OT Problem List: Decreased strength;Decreased activity tolerance;Impaired balance (sitting and/or standing);Decreased coordination;Decreased safety awareness;Pain      OT Treatment/Interventions: Self-care/ADL training;Therapeutic exercise;Neuromuscular education;Energy conservation;Patient/family education;Balance training;Therapeutic activities    OT Goals(Current goals can be found in the care plan section) Acute Rehab OT Goals Patient Stated Goal: to go home OT Goal Formulation: With patient Time For Goal Achievement: 07/11/18 Potential to Achieve  Goals: Good ADL Goals Pt Will Perform Grooming: Independently;sitting Pt Will Perform Upper Body Dressing: with set-up;sitting Pt Will Perform Lower Body Dressing: with supervision;sitting/lateral leans Pt Will Perform Toileting - Clothing Manipulation and hygiene: with supervision;sitting/lateral leans Pt/caregiver will Perform Home Exercise Program: Both right and left upper extremity;With written HEP provided;Independently Additional ADL Goal #1: Pt will state 3 energy conservation strategies to implement for treatment here and home.  OT Frequency: Min 3X/week   Barriers to D/C:            Co-evaluation              AM-PAC OT "6 Clicks" Daily Activity     Outcome Measure Help from another person eating meals?: None Help from another person taking care of personal grooming?: None Help from another person toileting, which includes using toliet, bedpan, or urinal?: A Lot Help from another person bathing (including washing, rinsing, drying)?: A Lot Help from another person to put on and taking off regular upper body clothing?: A Little Help from another person to put on and taking off regular lower body clothing?: A Lot 6 Click Score: 17   End of Session Equipment Utilized During Treatment: Gait belt;Rolling walker Nurse Communication: Mobility status  Activity Tolerance: Patient tolerated treatment well Patient left: in chair;with call bell/phone within reach;with chair alarm set  OT Visit Diagnosis: Unsteadiness on feet (R26.81);Other abnormalities of gait and mobility (R26.89);Muscle weakness (generalized) (M62.81)                Time: 0865-7846 OT Time Calculation (min): 43 min Charges:  OT General Charges $OT Visit: 1 Visit OT Evaluation $OT Eval Moderate Complexity: 1 Mod  Jaylani Mcguinn Cecil Cranker) Glendell Docker OTR/L Acute Rehabilitation Services  Pager: [phone] Office: [phone]   Timmia Cogburn J Maurio Baize 06/27/2018, 4:26 PM

## 2018-06-27 NOTE — Progress Notes (Signed)
PT Cancellation Note  Patient Details Name: Lucas Morales MRN: [id number] DOB: Jun 25, 1933   Cancelled Treatment:    Reason Eval/Treat Not Completed: (P) Patient at procedure or test/unavailable Pt off floor for biopsy. Therapy will follow back this afternoon for Evaluation as able.   Letetia Romanello B. Migdalia Dk PT, DPT Acute Rehabilitation Services Pager [phone] Office [phone]    [place] 06/27/2018, 10:53 AM

## 2018-06-27 NOTE — Progress Notes (Signed)
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  He is still on the heparin infusion.  I think once he has the biopsy, then he might be able to be switched over to Lovenox.  I cannot think of any invasive procedure that needs to be done for him.  He is having quite a bit of pain.  He says of the oxycodone is not that effective.  He has been on it before and he says that he has a tolerance to it.  He had a chest CT scan done yesterday.  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  He really is quite weak.  I know physical therapy is working with him.  I doubt that he will be able to go home right now.  He just is not able to care for himself.  I do not think his family will be able to care for him with all that is going on.  I think if he is to be discharged, he probably will need to go to some type of skilled nursing or rehab facility.  I will get him on some Decadron today.  Hopefully this might help with some of the pain.  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  The only laboratory studies that we have back today are his CBC which shows a white cell count of 13.7.  Hemoglobin 12.3.  And platelet count 300,000.  He has had no issues with his bowels or bladder.  He has no appetite.  Maybe, the Decadron will help with this.  I am also going to give him a dose of Zometa today.  If he has a biopsy today, I would not expect that any results will be back for at least early next week.  His vital signs all look stable.  His blood pressure is on the high side at 146/100.  His pulse is elevated at 114.  He is a febrile.  His oral exam shows no mucositis.  There is no thrush.  Lungs are pretty clear bilaterally.  Cardiac exam tachycardic but regular.  Abdomen is soft.  He is somewhat obese.  He has no fluid wave.  There is no palpable liver or spleen tip.  Extremities shows some chronic nonpitting edema in his legs.  Neurological exam is  nonfocal.  We still are dealing with a malignancy that we do not know the etiology.  Again his biopsy is going to be done today.  If this is hepatocellular carcinoma, I would be surprised.  He usually does not metastasize in this manner.  I am off today and tomorrow.  I will still round on him on Friday.   Lattie Haw, MD  Psalm 91:1-2

## 2018-06-27 NOTE — Progress Notes (Signed)
[facility] for Enoxaparin Indication: Blood clots/IVC thrombus  Allergies  Allergen Reactions  . Morphine Itching    Patient Measurements: Height: 6\' 2"  (188 cm) Weight: 223 lb 4.8 oz (101.3 kg) IBW/kg (Calculated) : 82.2 Heparin Dosing Weight: 101kg  Vital Signs: Temp: 98.4 F (36.9 C) (04/09 1247) Temp Source: Oral (04/09 1247) BP: 154/83 (04/09 1247) Pulse Rate: 100 (04/09 1247)  Labs: Recent Labs    06/25/18 0851 06/26/18 0523 06/26/18 1324 06/27/18 0655  HGB  --  12.4*  --  12.3*  HCT  --  37.6*  --  36.3*  PLT  --  288  --  300  HEPARINUNFRC  --  0.40 0.51 <0.10*  CREATININE 0.85  --   --  1.94*    Estimated Creatinine Clearance: 37.3 mL/min (A) (by C-G formula based on SCr of 1.94 mg/dL (H)).   Assessment: 85 yo male with recent back surgery now found to have possible malignancy. 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 Patient on heparin drip per Heme/Onc MD prior to biopsy. Pharmacy to dose Enoxaparin at appropriate time post-biopsy.  This is a standard bleeding risk procedure, so therapeutic enoxaparin can start approximately 6 hours after the procedure.  Goal of Therapy:   Monitor platelets by anticoagulation protocol: Yes   Plan:  Start Lovenox 100 mg subq q12hr at 1900 Daily CBC Monitor for bleeding complications and platelet count.   Thanks for allowing pharmacy to be a part of this patient's care.  Alanda Slim, PharmD, [facility] Clinical Pharmacist Please see AMION for all Pharmacists' Contact Phone Numbers 06/27/2018, 1:23 PM

## 2018-06-27 NOTE — Consult Note (Signed)
Medical Consultation   CRU KRITIKOS  BRA:[id number]  DOB: Apr 02, 1933  DOA: 06/21/2018  PCP: Deland Pretty, MD   Outpatient Specialists:   Requesting physician: Rodell Perna, MD  Reason for consultation: AKI, multiple medical issues.   History of Present Illness: Lucas Morales is an 85 y.o. male HPI: Lucas Morales is a 85 y.o. male with medical history significant of adenomatous polyps, alpha-1 antitrypsin deficiency carrier, anemia, anxiety, depression, osteoarthritis of the knees and back, easy bruising, chronic bronchitis, chronic lower back pain, CAD, history of frequent diarrhea due to partial colectomy, diverticulosis, episodes of diverticulitis, hemorrhoids, gastritis, hiatal hernia, GERD, esophageal stricture, esophageal dysmotility, history of post CABG paroxysmal A. fib, history of bradycardia, hyperlipidemia, hypertension, OSA not on CPAP due to intolerance, restless leg syndrome, sciatic nerve pain, type 2 diabetes, urinary frequency who had lumbar decompression L3-4 on 05/10/2018 and was admitted about a week ago due to suspected L3 vertebral body osteomyelitis.  However, after reviewing all imaging and getting new imaging studies, it was determined that the patient had a metastatic lesion.  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  He was seen by oncology 3 days ago.  He underwent biopsy of the area with interventional radiology earlier today.  We are seeing him consult due to AKI, worsening LFTs and multiple medical issues.  He denies fever, headache, chills, sore throat, dyspnea, chest pain, palpitations, diaphoresis, PND, orthopnea or pitting edema of the lower extremities.  He complains of constipation, but denies abdominal pain, nausea, emesis, diarrhea, melena or hematochezia.  No dysuria, frequency or hematuria.  Denies polyuria, polydipsia, polyphagia or blurred vision.  Review of Systems:  As per HPI otherwise 10 point review of systems negative.    Past Medical History: Past Medical History:  Diagnosis Date   Adenomatous colon polyp    Alpha-1-antitrypsin deficiency ([place])    Alpha-1-antitrypsin deficiency carrier    Anemia    Anxiety    Arthritis    "knees; bad in my back" (09/01/2014)   Bruises easily    Chronic bronchitis ([place])    "get it pretty much q yr" (09/01/2014)   Chronic lower back pain    Coronary artery disease    Depression    "related to health" (09/01/2014)   Diarrhea    has had a part of colon removed   Diverticulitis    Diverticulosis    Dyspnea on exertion    Dysrhythmia    bradycardia in the 30's (01/11/2012).. Afib briefly after CABG   Esophageal dysmotility    Esophageal stricture    Gastritis    GERD (gastroesophageal reflux disease)    takes Pantoprazole daily   Gout    Hemorrhoids    Hiatal hernia    History of colon polyps    Hyperlipidemia    doesn't require meds   Hypertension    takes Losartan daily; current not taking    IC (interstitial cystitis)    Insomnia    takes Nortrypyline nightly   Joint pain    Joint swelling    Macular hole of both eyes    Nocturia    OSA (obstructive sleep apnea)    "mild; tried CPAP; couldn't tolerate" (09/01/2014)   Paroxysmal atrial fibrillation (HCC)    Restless leg syndrome    Sciatic nerve pain    Type II diabetes mellitus ([place])    "borderline"   Urinary frequency  Past Surgical History: Past Surgical History:  Procedure Laterality Date   25 GAUGE PARS PLANA VITRECTOMY WITH 20 GAUGE MVR PORT FOR MACULAR HOLE Right 10/08/2012   Procedure: 25 GAUGE PARS PLANA VITRECTOMY WITH 20 GAUGE MVR PORT FOR MACULAR HOLE; Membrame peel, serum patch; Laser treatment ; Gas exchange;  Surgeon: Hayden Pedro, MD;  Location: [facility];  Service: Ophthalmology;  Laterality: Right;   25 GAUGE PARS PLANA VITRECTOMY WITH 20 GAUGE MVR PORT FOR MACULAR HOLE Left 09/01/2014   Procedure: 25 GAUGE PARS PLANA VITRECTOMY WITH 20  GAUGE MVR PORT FOR MACULAR HOLE;  Surgeon: Hayden Pedro, MD;  Location: [facility];  Service: Ophthalmology;  Laterality: Left;   ANTERIOR CERVICAL DECOMP/DISCECTOMY FUSION N/A 12/20/2015   Procedure: C5-6 Anterior Cervical Discectomy and Fusion, Allograft, and Plate;  Surgeon: [name], MD;  Location: [facility];  Service: Orthopedics;  Laterality: N/A;   APPENDECTOMY  2009   bladder lesion removed     CARDIAC CATHETERIZATION  01/11/2012   CATARACT EXTRACTION W/ INTRAOCULAR LENS  IMPLANT, BILATERAL Bilateral 2011   CHOLECYSTECTOMY  2009   COLONOSCOPY     COMPRESSION HIP SCREW Left 04/04/2014   Procedure: COMPRESSION HIP LEFT;  Surgeon: Mcarthur Rossetti, MD;  Location: [facility];  Service: Orthopedics;  Laterality: Left;   CORONARY ANGIOPLASTY     CORONARY ARTERY BYPASS GRAFT  01/18/2012   Procedure: CORONARY ARTERY BYPASS GRAFTING (CABG);  Surgeon: Ivin Poot, MD;  Location: [facility];  Service: Open Heart Surgery;  Laterality: N/A;  times four, using left internal mammary artery   CYSTOSCOPY WITH URETEROSCOPY  2008; 2009   "painted inside of bladder wall"   ELBOW SURGERY Right 1988   "reconstructive OR"   ENDOVEIN HARVEST OF GREATER SAPHENOUS VEIN  01/18/2012   Procedure: ENDOVEIN HARVEST OF GREATER SAPHENOUS VEIN;  Surgeon: Ivin Poot, MD;  Location: [facility];  Service: Open Heart Surgery;  Laterality: Right;   ESOPHAGOGASTRODUODENOSCOPY     ESOPHAGOGASTRODUODENOSCOPY N/A 06/05/2014   Procedure: ESOPHAGOGASTRODUODENOSCOPY (EGD);  Surgeon: Irene Shipper, MD;  Location: [facility];  Service: Endoscopy;  Laterality: N/A;   EXCISION/RELEASE BURSA HIP Left 07/30/2015   Procedure: EXCISION/RELEASE BURSA HIP;  Surgeon: Mcarthur Rossetti, MD;  Location: [facility];  Service: Orthopedics;  Laterality: Left;   EXPLORATORY LAPAROTOMY  2009   with right colectomy   FRACTURE SURGERY     GAS INSERTION Left 09/01/2014   Procedure: INSERTION OF GAS;  Surgeon: Hayden Pedro, MD;   Location: [facility];  Service: Ophthalmology;  Laterality: Left;  C3F8   GAS/FLUID EXCHANGE Left 09/01/2014   Procedure: GAS/FLUID EXCHANGE;  Surgeon: Hayden Pedro, MD;  Location: [facility];  Service: Ophthalmology;  Laterality: Left;   HARDWARE REMOVAL Left 07/30/2015   Procedure: attempted LEFT HIP HARDWARE REMOVAL, LEFT HIP BURSECTOMY;  Surgeon: Mcarthur Rossetti, MD;  Location: [facility];  Service: Orthopedics;  Laterality: Left;   HEEL SPUR EXCISION Bilateral 1970's   INTERTROCHANTERIC HIP FRACTURE SURGERY Left 04/04/2014   IR US GUIDE BX ASP/DRAIN  06/21/2018   JOINT REPLACEMENT     LEFT HEART CATH AND CORS/GRAFTS ANGIOGRAPHY N/A 05/28/2017   Procedure: LEFT HEART CATH AND CORS/GRAFTS ANGIOGRAPHY;  Surgeon: Belva Crome, MD;  Location: [facility];  Service: Cardiovascular;  Laterality: N/A;   LEFT HEART CATHETERIZATION WITH CORONARY ANGIOGRAM N/A 01/11/2012   Procedure: LEFT HEART CATHETERIZATION WITH CORONARY ANGIOGRAM;  Surgeon: Leonie Man, MD;  Location: [facility];  Service:  Cardiovascular;  Laterality: N/A;   LUMBAR DISC SURGERY  ?2010   "cleaned out the stenosis" (01/11/2012)   LUMBAR LAMINECTOMY/DECOMPRESSION MICRODISCECTOMY N/A 05/10/2018   Procedure: redo lumbar decompression L3-4;  Surgeon: [name], MD;  Location: [facility];  Service: Orthopedics;  Laterality: N/A;   MEMBRANE PEEL Left 09/01/2014   Procedure: MEMBRANE PEEL;  Surgeon: Hayden Pedro, MD;  Location: [facility];  Service: Ophthalmology;  Laterality: Left;   NM MYOCAR MULTIPLE W/SPECT  04/09/2012   NORMAL STRESS NUCLEAR STUDY. EF 51%.   PARS PLANA VITRECTOMY W/ REPAIR OF MACULAR HOLE Left 09/01/2014   PARTIAL COLECTOMY     d/t diverticulosis   PHOTOCOAGULATION WITH LASER Left 09/01/2014   Procedure: PHOTOCOAGULATION WITH LASER;  Surgeon: Hayden Pedro, MD;  Location: [facility];  Service: Ophthalmology;  Laterality: Left;  HEADSCOPE LASER   RECONSTRUCTION MEDIAL COLLATERAL LIGAMENT ELBOW W/ TENDON  GRAFT Right 1988 X 2   SERUM PATCH Left 09/01/2014   Procedure: SERUM PATCH;  Surgeon: Hayden Pedro, MD;  Location: [facility];  Service: Ophthalmology;  Laterality: Left;   TONSILLECTOMY AND ADENOIDECTOMY  ~1943   TOTAL KNEE ARTHROPLASTY Bilateral ~ 2001; 2011   right; left   TRANSTHORACIC ECHOCARDIOGRAM  01/04/2012   MODERATE SEVERE-SEVERE LVH. EF=>55%. MILDLY IMPAIRED LV RELAXATION. LA IS MODERATE TO SEVERE DILATED. MODERATE MR. MV LEAFLETS APPEAR THICHENED.   TUMOR EXCISION Left 12/2011   arm     Allergies:   Allergies  Allergen Reactions   Morphine Itching     Social History:  reports that he has never smoked. He has never used smokeless tobacco. He reports current alcohol use. He reports that he does not use drugs.   Family History: Family History  Problem Relation Age of Onset   Emphysema Mother    Kidney disease Father    Emphysema Maternal Grandfather    Colon cancer Neg Hx    Physical Exam: Vitals:   06/27/18 1204 06/27/18 1225 06/27/18 1230 06/27/18 1247  BP: (!) 161/98 (!) 155/104 (!) 172/92 (!) 154/83  Pulse: (!) 102 95 96 100  Resp: 18 18 18 18   Temp:    98.4 F (36.9 C)  TempSrc:    Oral  SpO2: 98% 98% 98% 93%  Weight:      Height:        Constitutional: Alert and awake, oriented x3, not in any acute distress. Eyes: PERLA, EOMI, irises appear normal, anicteric sclera,  ENMT: external ears and nose appear normal, normal hearing.            Lips lips and oral mucosa are dry, posterior pharynx appear normal  Neck: neck appears normal, no masses, normal ROM, no thyromegaly, no JVD  CVS: S1-S2 clear, no murmur rubs or gallops, no LE edema, normal pedal pulses  Respiratory:  Decreased breath sounds on bases, otherwise clear to auscultation bilaterally, no wheezing, rales or rhonchi. Respiratory effort normal. No accessory muscle use.  Abdomen: Obese, soft nontender, nondistended, normal bowel sounds, no hepatosplenomegaly, no hernias    Musculoskeletal: : no cyanosis, clubbing or edema noted bilaterally Neuro: Cranial nerves II-XII intact, strength, sensation, reflexes Psych: judgement and insight appear normal, stable mood and affect, mental status Skin: no rashes or lesions or ulcers, no induration or nodules on very limited dermatological examination.  Data reviewed:  I have personally reviewed following labs and imaging studies Labs:  CBC: Recent Labs  Lab 06/21/18 1323 06/21/18 1642 06/22/18 0320 06/26/18 0523 06/27/18 0655  WBC 6.6 7.2  6.5 11.6* 13.7*  NEUTROABS  --  5.7  --   --  11.5*  HGB 11.5* 11.6* 10.7* 12.4* 12.3*  HCT 35.3* 34.2* 33.4* 37.6* 36.3*  MCV 97.8 97.4 99.7 95.4 94.5  PLT 241 226 225 288 867    Basic Metabolic Panel: Recent Labs  Lab 06/21/18 1642 06/22/18 0320 06/25/18 0851 06/27/18 0655  NA 140 137  --  127*  K 4.3 4.0  --  4.6  CL 111 107  --  95*  CO2 23 22  --  18*  GLUCOSE 119* 124*  --  149*  BUN 19 18  --  49*  CREATININE 0.96 0.97 0.85 1.94*  CALCIUM 9.2 9.0  --  8.8*   GFR Estimated Creatinine Clearance: 37.3 mL/min (A) (by C-G formula based on SCr of 1.94 mg/dL (H)). Liver Function Tests: Recent Labs  Lab 06/21/18 1642 06/27/18 0655  AST 43* 43*  ALT 43 45*  ALKPHOS 412* 508*  BILITOT 0.9 1.2  PROT 5.9* 6.2*  ALBUMIN 3.2* 2.7*   No results for input(s): LIPASE, AMYLASE in the last 168 hours. No results for input(s): AMMONIA in the last 168 hours. Coagulation profile Recent Labs  Lab 06/21/18 1323  INR 1.1    Cardiac Enzymes: No results for input(s): CKTOTAL, CKMB, CKMBINDEX, TROPONINI in the last 168 hours. BNP: Invalid input(s): POCBNP CBG: Recent Labs  Lab 06/26/18 1235 06/26/18 1811 06/26/18 2156 06/27/18 0753 06/27/18 1251  GLUCAP 112* 121* 143* 148* 163*   D-Dimer No results for input(s): DDIMER in the last 72 hours. Hgb A1c No results for input(s): HGBA1C in the last 72 hours. Lipid Profile No results for input(s): CHOL, HDL,  LDLCALC, TRIG, CHOLHDL, LDLDIRECT in the last 72 hours. Thyroid function studies No results for input(s): TSH, T4TOTAL, T3FREE, THYROIDAB in the last 72 hours.  Invalid input(s): FREET3 Anemia work up No results for input(s): VITAMINB12, FOLATE, FERRITIN, TIBC, IRON, RETICCTPCT in the last 72 hours. Urinalysis    Component Value Date/Time   COLORURINE YELLOW 06/21/2018 1700   APPEARANCEUR CLEAR 06/21/2018 1700   LABSPEC 1.018 06/21/2018 1700   PHURINE 5.0 06/21/2018 1700   GLUCOSEU NEGATIVE 06/21/2018 1700   HGBUR MODERATE (A) 06/21/2018 1700   BILIRUBINUR NEGATIVE 06/21/2018 1700   KETONESUR NEGATIVE 06/21/2018 1700   PROTEINUR NEGATIVE 06/21/2018 1700   UROBILINOGEN 1.0 01/23/2012 1727   NITRITE NEGATIVE 06/21/2018 [place] 06/21/2018 1700     Microbiology Recent Results (from the past 240 hour(s))  Aerobic/Anaerobic Culture (surgical/deep wound)     Status: None   Collection Time: 06/21/18  3:00 PM  Result Value Ref Range Status   Specimen Description ABSCESS  Final   Special Requests LOWER LUMBAR  Final   Gram Stain NO WBC SEEN NO ORGANISMS SEEN   Final   Culture   Final    No growth aerobically or anaerobically. Performed at [facility], [place] [address] [place], [place] [address]    Report Status 06/26/2018 FINAL  Final  Urine culture     Status: None   Collection Time: 06/21/18  5:05 PM  Result Value Ref Range Status   Specimen Description URINE, RANDOM  Final   Special Requests NONE  Final   Culture   Final    NO GROWTH Performed at [facility], [place] [address], [place], [place] [address]    Report Status 06/22/2018 FINAL  Final       Inpatient Medications:   Scheduled  Meds:  clonazePAM  0.5 mg Oral QHS   dexamethasone  20 mg Intravenous Daily   docusate sodium  100 mg Oral BID   enoxaparin (LOVENOX) injection  1 mg/kg Subcutaneous Q12H   feeding supplement  1 Container Oral TID BM   feeding supplement  (PRO-STAT SUGAR FREE 64)  30 mL Oral BID WC   fentaNYL       fluconazole  100 mg Oral Daily   insulin aspart  0-15 Units Subcutaneous TID WC   midazolam       multivitamin with minerals  1 tablet Oral Daily   oxyCODONE  15 mg Oral Q12H   pantoprazole  40 mg Oral BID   polyethylene glycol  17 g Oral Daily   sodium chloride flush  10-40 mL Intracatheter Q12H   tamsulosin  0.4 mg Oral Daily   Continuous Infusions:  sodium chloride 100 mL/hr at 06/27/18 1255     Radiological Exams on Admission: Dg Pelvis 1-2 Views  Result Date: 06/26/2018 CLINICAL DATA:  Pelvic fracture. EXAM: PELVIS - 1-2 VIEW COMPARISON:  MRI pelvis 06/25/2018. Nuclear medicine bone scan 06/25/2018. CT abdomen and pelvis 06/24/2018. FINDINGS: There is heterogeneously increased density in the right ischium with superimposed curvilinear lucency corresponding to the pathologic fracture shown on the prior studies without significant displacement evident on these AP radiographs. The femoral heads are approximated with the acetabula on these AP radiographs. A remote, healed left femur fracture is again noted with partial visualization of an intramedullary nail and hip screw. IMPRESSION: Known pathologic fracture of the right ischium. Electronically Signed   By: Logan Bores M.D.   On: 06/26/2018 20:46   Ct Chest W Contrast  Result Date: 06/26/2018 CLINICAL DATA:  Osseous lesions involving the L3 vertebral body, right acetabulum and ischium suspicious for metastatic disease. Ill-defined right hepatic lesion. Assess for primary lung cancer. EXAM: CT CHEST WITH CONTRAST TECHNIQUE: Multidetector CT imaging of the chest was performed during intravenous contrast administration. CONTRAST:  27mL OMNIPAQUE IOHEXOL 300 MG/ML  SOLN COMPARISON:  Abdominopelvic CT 06/24/2018, abdominal MRI 06/25/2018, cardiac CT 12/17/2008 and bone scan 06/25/2018. FINDINGS: Cardiovascular: Contrast bolus is suboptimal. There is atherosclerosis of the  aorta, great vessels and coronary arteries status post median sternotomy and CABG. Right arm PICC extends to the superior cavoatrial junction. There is stable mild cardiomegaly. No significant pericardial effusion. Mediastinum/Nodes: There are no enlarged mediastinal, hilar or axillary lymph nodes.Moderate to large hiatal hernia again noted. The trachea and thyroid gland appear unremarkable. Lungs/Pleura: There is no pleural effusion or pneumothorax. There is mild chronic lung disease with central airway thickening and subpleural reticulation. There are multiple noncalcified pulmonary nodules which appear new compared with the remote cardiac CT (the lungs are incompletely visualized at that time). The largest nodule is located posteriorly in the right upper lobe along the superior aspect of the major fissure, measuring 18 x 13 mm on image 81/4. Other nodules include a 12 x 7 mm right upper lobe nodule (image 66/4), an 8 x 7 mm right lower lobe nodule (image 120/4) and a 9 mm left upper lobe nodule (image 41/4). There is a calcified right upper lobe granuloma. Upper abdomen: As seen on recent CT and MRI, there is a peripherally enhancing mass centrally in the right hepatic lobe measuring up to 5.4 x 4.4 cm on image 138/3. Adjacent satellite lesions, IVC extension of tumor and intrahepatic biliary dilatation posteriorly in the right hepatic lobe are again noted. Musculoskeletal/Chest wall: There is no chest wall  mass or suspicious osseous finding. Previous median sternotomy and lower cervical fusion. There are loose bodies within the right shoulder joint. IMPRESSION: 1. There are multiple pulmonary nodules bilaterally, including a dominant irregular right upper lobe lesion which could reflect primary lung cancer. The other nodules appear new from remote cardiac CT, suspicious for metastatic disease. No thoracic adenopathy. 2. Moderate to large hiatal hernia. 3. Central mass in the right hepatic lobe with associated  biliary dilatation again noted as seen on recent abdominal studies. 4. Based on the patient's prior studies, it remains uncertain as to what the primary malignancy is in this case. 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 Electronically Signed   By: Richardean Sale M.D.   On: 06/26/2018 09:18   Ct Biopsy  Result Date: 06/27/2018 CLINICAL DATA:  Liver lesion. Expansile lytic lesion of the right ischium. EXAM: CT GUIDED DEEP CORE BONE BIOPSY OF RIGHT ISCHIAL LESION ANESTHESIA/SEDATION: Intravenous Fentanyl 22mcg and Versed 1mg  were administered as conscious sedation during continuous monitoring of the patient's level of consciousness and physiological / cardiorespiratory status by the radiology RN, with a total moderate sedation time of 8 minutes. PROCEDURE: The procedure risks, benefits, and alternatives were explained to the patient. Questions regarding the procedure were encouraged and answered. The patient understands and consents to the procedure. patient placed left lateral decubitus. select axial scans through the pelvis were obtained. the soft tissue component of the right ischial lesion was localized and an appropriate skin entry site was determined and marked. The operative field was prepped with chlorhexidinein a sterile fashion, and a sterile drape was applied covering the operative field. A sterile gown and sterile gloves were used for the procedure. Local anesthesia was provided with 1% Lidocaine. Under CT fluoroscopic guidance, a 17 gauge trocar needle was advanced to the margin of the lesion. Once needle tip position was confirmed, coaxial 18-gauge core biopsy samples were obtained, submitted in formalin to surgical pathology. The guide needle was removed. Postprocedure scans show no hemorrhage or other apparent complication. The patient tolerated the procedure well. COMPLICATIONS: None  immediate FINDINGS: Soft tissue mass about the right ischium was localized. Representative core biopsy samples obtained as above. IMPRESSION: 1. Technically successful CT-guided deep core bone biopsy, right ischial lesion. Electronically Signed   By: Lucrezia Europe M.D.   On: 06/27/2018 12:58    Impression/Recommendations Active Problems:   AKI (acute kidney injury) ([place])  Likely due to decreased oral intake due to pre-procedure. Continue IV hydration. Check urinalysis. Check urine sodium. Monitor intake and output. Follow-up renal function electrolytes.    Essential hypertension Beta-blocker has been held. Monitor blood pressure.    Dyslipidemia Atorvastatin was held.    PAF (paroxysmal atrial fibrillation) (HCC) Currently with regular rhythm.    OSA (obstructive sleep apnea) Not on CPAP. Continue supplemental oxygen.    Deep venous thrombosis (HCC) Continue anticoagulation.    Cancer associated pain Currently satisfied with pain management.    DNR (do not resuscitate) Discussed with palliative care earlier today.    Type II diabetes mellitus (HCC) Carbohydrate modified diet. On dexamethasone. CBG monitoring regular insulin sliding scale.    Coronary artery disease Denies any chest pain. Currently not on statin or beta-blocker. He is currently on anticoagulation.  Thank you for this consultation.  Our [facility] hospitalist team will follow the patient with you.   Time Spent: About 55 minutes were spent during the process of this admission.  Reubin Milan M.D. [facility] 06/27/2018, 2:23 PM   This document was prepared using Dragon voice recognition software and may contain some unintended transcription errors.

## 2018-06-27 NOTE — Plan of Care (Signed)

## 2018-06-28 ENCOUNTER — Inpatient Hospital Stay (HOSPITAL_COMMUNITY): Payer: Medicare Other

## 2018-06-28 DIAGNOSIS — N179 Acute kidney failure, unspecified: Secondary | ICD-10-CM

## 2018-06-28 DIAGNOSIS — R1900 Intra-abdominal and pelvic swelling, mass and lump, unspecified site: Secondary | ICD-10-CM | POA: Diagnosis present

## 2018-06-28 DIAGNOSIS — C801 Malignant (primary) neoplasm, unspecified: Secondary | ICD-10-CM

## 2018-06-28 DIAGNOSIS — I82401 Acute embolism and thrombosis of unspecified deep veins of right lower extremity: Secondary | ICD-10-CM

## 2018-06-28 LAB — GLUCOSE, CAPILLARY
Glucose-Capillary: 196 mg/dL — ABNORMAL HIGH (ref 70–99)
Glucose-Capillary: 249 mg/dL — ABNORMAL HIGH (ref 70–99)
Glucose-Capillary: 218 mg/dL — ABNORMAL HIGH (ref 70–99)
Glucose-Capillary: 238 mg/dL — ABNORMAL HIGH (ref 70–99)

## 2018-06-28 LAB — CBC WITH DIFFERENTIAL/PLATELET
Abs Immature Granulocytes: 0.13 10*3/uL — ABNORMAL HIGH (ref 0.00–0.07)
Basophils Absolute: 0 10*3/uL (ref 0.0–0.1)
Basophils Relative: 0 %
Eosinophils Absolute: 0 10*3/uL (ref 0.0–0.5)
Eosinophils Relative: 0 %
HCT: 34.5 % — ABNORMAL LOW (ref 39.0–52.0)
Hemoglobin: 11.4 g/dL — ABNORMAL LOW (ref 13.0–17.0)
Immature Granulocytes: 2 %
Lymphocytes Relative: 7 %
Lymphs Abs: 0.6 10*3/uL — ABNORMAL LOW (ref 0.7–4.0)
MCH: 31.6 pg (ref 26.0–34.0)
MCHC: 33 g/dL (ref 30.0–36.0)
MCV: 95.6 fL (ref 80.0–100.0)
Monocytes Absolute: 0.4 10*3/uL (ref 0.1–1.0)
Monocytes Relative: 5 %
Neutro Abs: 7.2 10*3/uL (ref 1.7–7.7)
Neutrophils Relative %: 86 %
Platelets: 275 10*3/uL (ref 150–400)
RBC: 3.61 MIL/uL — ABNORMAL LOW (ref 4.22–5.81)
RDW: 15.1 % (ref 11.5–15.5)
WBC: 8.3 10*3/uL (ref 4.0–10.5)
nRBC: 0 % (ref 0.0–0.2)

## 2018-06-28 LAB — COMPREHENSIVE METABOLIC PANEL
ALT: 41 U/L (ref 0–44)
AST: 37 U/L (ref 15–41)
Albumin: 2.7 g/dL — ABNORMAL LOW (ref 3.5–5.0)
Alkaline Phosphatase: 472 U/L — ABNORMAL HIGH (ref 38–126)
Anion gap: 13 (ref 5–15)
BUN: 65 mg/dL — ABNORMAL HIGH (ref 8–23)
CO2: 18 mmol/L — ABNORMAL LOW (ref 22–32)
Calcium: 8.8 mg/dL — ABNORMAL LOW (ref 8.9–10.3)
Chloride: 99 mmol/L (ref 98–111)
Creatinine, Ser: 1.92 mg/dL — ABNORMAL HIGH (ref 0.61–1.24)
GFR calc Af Amer: 37 mL/min — ABNORMAL LOW (ref >60)
GFR calc non Af Amer: 32 mL/min — ABNORMAL LOW (ref >60)
Glucose, Bld: 175 mg/dL — ABNORMAL HIGH (ref 70–99)
Potassium: 5 mmol/L (ref 3.5–5.1)
Sodium: 130 mmol/L — ABNORMAL LOW (ref 135–145)
Total Bilirubin: 0.9 mg/dL (ref 0.3–1.2)
Total Protein: 5.9 g/dL — ABNORMAL LOW (ref 6.5–8.1)

## 2018-06-28 LAB — CK: Total CK: 30 U/L — ABNORMAL LOW (ref 49–397)

## 2018-06-28 LAB — LACTATE DEHYDROGENASE: LDH: 146 U/L (ref 98–192)

## 2018-06-28 MED ORDER — METOPROLOL SUCCINATE ER 50 MG PO TB24
50.0000 mg | ORAL_TABLET | Freq: Every day | ORAL | Status: DC
  Administered 2018-06-28 – 2018-07-19 (×22): 50 mg via ORAL
  Filled 2018-06-28 (×7): qty 1
  Filled 2018-06-28: qty 2
  Filled 2018-06-28 (×6): qty 1
  Filled 2018-06-28: qty 2
  Filled 2018-06-28 (×2): qty 1
  Filled 2018-06-28: qty 2
  Filled 2018-06-28 (×2): qty 1
  Filled 2018-06-28 (×2): qty 2

## 2018-06-28 MED ORDER — OXYCODONE HCL 5 MG PO TABS
15.0000 mg | ORAL_TABLET | ORAL | Status: DC | PRN
  Administered 2018-06-28 – 2018-07-01 (×9): 15 mg via ORAL
  Filled 2018-06-28 (×9): qty 3

## 2018-06-28 MED ORDER — OXYCODONE HCL 5 MG PO TABS: 10.0000 mg | ORAL_TABLET | ORAL | Status: DC | PRN

## 2018-06-28 MED ORDER — SENNA 8.6 MG PO TABS
2.0000 | ORAL_TABLET | Freq: Every day | ORAL | Status: DC
  Administered 2018-06-28 – 2018-07-10 (×9): 17.2 mg via ORAL
  Filled 2018-06-28 (×11): qty 2

## 2018-06-28 MED ORDER — CLONAZEPAM 0.5 MG PO TABS
0.2500 mg | ORAL_TABLET | Freq: Every day | ORAL | Status: DC
  Administered 2018-06-28: 22:00:00 0.25 mg via ORAL
  Filled 2018-06-28: qty 1

## 2018-06-28 MED ORDER — ENSURE ENLIVE PO LIQD
237.0000 mL | Freq: Two times a day (BID) | ORAL | Status: DC
  Administered 2018-06-28 – 2018-07-11 (×17): 237 mL via ORAL

## 2018-06-28 NOTE — Progress Notes (Signed)
PROGRESS NOTE    Lucas Morales  KYH:[id number] DOB: [birth year] DOA: 06/21/2018 PCP: Deland Pretty, MD    Brief Narrative:  Lucas Morales is a 85 y.o. male with medical history significant of adenomatous polyps, alpha-1 antitrypsin deficiency carrier, anemia, anxiety, depression, osteoarthritis of the knees and back, easy bruising, chronic bronchitis, chronic lower back pain, CAD, history of frequent diarrhea due to partial colectomy, diverticulosis, episodes of diverticulitis, hemorrhoids, gastritis, hiatal hernia, GERD, esophageal stricture, esophageal dysmotility, history of post CABG paroxysmal A. fib, history of bradycardia, hyperlipidemia, hypertension, OSA not on CPAP due to intolerance, restless leg syndrome, sciatic nerve pain, type 2 diabetes, urinary frequency who had lumbar decompression L3-4 on 05/10/2018 and was admitted about a week ago due to suspected L3 vertebral body osteomyelitis.  However, after reviewing all imaging and getting new imaging studies, it was determined that the patient had a metastatic lesion.  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  He was seen by oncology 3 days ago.  He underwent biopsy of the area with interventional radiology earlier today.  We are seeing him consult due to AKI, worsening LFTs and multiple medical issues.   Developed AKI: Suspect multifactorial secondary to contrast and dehydration.  Assessment & Plan:   Principal Problem:   AKI (acute kidney injury) ([place]) Active Problems:   Essential hypertension   Dyslipidemia   PAF (paroxysmal atrial fibrillation) (HCC)   OSA (obstructive sleep apnea)   Deep venous thrombosis (HCC)   Cancer associated pain   DNR (do not resuscitate)   Type II diabetes mellitus ([place])   Coronary artery disease  1-AKI: Likely due to decreased oral intake and contrast exposure for CT abdomen and pelvis.  He also had some problem with urine retention. Continue with Foley catheter. Creatinine appears  stable today at 1.9. Strict I's and O's. Renal ultrasound normal.  Pelvic mass, multiple liver masses; Awaiting pathology report. Dr. Marin Olp following. On Decadron to help with pain.  L3 lesion: Likely metastatic disease and not consistent with infection.  Hypertension: Resume metoprolol.  HLD; holding due to transaminases.  Paroxysmal A. fib: We will resume metoprolol.  OSA: Not on CPAP.  Continue with oxygen supplementation.  Acute DVT, right soleal vein: Now on Lovenox continue at discharge. Doppler; Findings consistent with acute deep vein thrombosis involving the right soleal vein.  Cancer associated pain: On oxycodone and OxyContin. Type 2 diabetes: Sliding scale insulin.  Monitor on Decadron.  Coronary artery disease: Resume beta-blocker.  Transaminases: Suspect related to liver masses   Hyponatremia: Improved with IV fluids.  Nutrition Problem: Increased nutrient needs Etiology: post-op healing    Signs/Symptoms: estimated needs    Interventions: MVI, Prostat, Boost Breeze  Estimated body mass index is 28.67 kg/m as calculated from the following:   Height as of this encounter: 6\' 2"  (1.88 m).   Weight as of this encounter: 101.3 kg.   DVT prophylaxis: Lovenox Code Status: DNR Family Communication: Care discussed with patient Disposition Plan; patient declining a skilled nursing facility Home when renal function improved.    Subjective: He reports that his pain is controlled.  Is feeling okay.  Objective: Vitals:   06/27/18 1204 06/27/18 1225 06/27/18 1230 06/27/18 1247  BP: (!) 161/98 (!) 155/104 (!) 172/92 (!) 154/83  Pulse: (!) 102 95 96 100  Resp: 18 18 18 18   Temp:    98.4 F (36.9 C)  TempSrc:    Oral  SpO2: 98% 98% 98% 93%  Weight:  Height:        Intake/Output Summary (Last 24 hours) at 06/28/2018 0836 Last data filed at 06/28/2018 0357 Gross per 24 hour  Intake 1238 ml  Output 750 ml  Net 488 ml   Filed Weights    06/21/18 1652  Weight: 101.3 kg    Examination:  General exam: Appears calm and comfortable  Respiratory system: Clear to auscultation. Respiratory effort normal. Cardiovascular system: S1 & S2 heard, RRR. No JVD, murmurs, rubs, gallops or clicks. No pedal edema. Gastrointestinal system: Abdomen is nondistended, soft and nontender. No organomegaly or masses felt. Normal bowel sounds heard. Central nervous system: Alert and oriented. No focal neurological deficits. Extremities: Symmetric 5 x 5 power. Skin: No rashes, lesions or ulcers Psychiatry: Judgement and insight appear normal. Mood & affect appropriate.     Data Reviewed: I have personally reviewed following labs and imaging studies  CBC: Recent Labs  Lab 06/21/18 1642 06/22/18 0320 06/26/18 0523 06/27/18 0655 06/28/18 0444  WBC 7.2 6.5 11.6* 13.7* 8.3  NEUTROABS 5.7  --   --  11.5* 7.2  HGB 11.6* 10.7* 12.4* 12.3* 11.4*  HCT 34.2* 33.4* 37.6* 36.3* 34.5*  MCV 97.4 99.7 95.4 94.5 95.6  PLT 226 225 288 300 628   Basic Metabolic Panel: Recent Labs  Lab 06/21/18 1642 06/22/18 0320 06/25/18 0851 06/27/18 0655 06/28/18 0444  NA 140 137  --  127* 130*  K 4.3 4.0  --  4.6 5.0  CL 111 107  --  95* 99  CO2 23 22  --  18* 18*  GLUCOSE 119* 124*  --  149* 175*  BUN 19 18  --  49* 65*  CREATININE 0.96 0.97 0.85 1.94* 1.92*  CALCIUM 9.2 9.0  --  8.8* 8.8*   GFR: Estimated Creatinine Clearance: 37.7 mL/min (A) (by C-G formula based on SCr of 1.92 mg/dL (H)). Liver Function Tests: Recent Labs  Lab 06/21/18 1642 06/27/18 0655 06/28/18 0444  AST 43* 43* 37  ALT 43 45* 41  ALKPHOS 412* 508* 472*  BILITOT 0.9 1.2 0.9  PROT 5.9* 6.2* 5.9*  ALBUMIN 3.2* 2.7* 2.7*   No results for input(s): LIPASE, AMYLASE in the last 168 hours. No results for input(s): AMMONIA in the last 168 hours. Coagulation Profile: Recent Labs  Lab 06/21/18 1323  INR 1.1   Cardiac Enzymes: No results for input(s): CKTOTAL, CKMB,  CKMBINDEX, TROPONINI in the last 168 hours. BNP (last 3 results) No results for input(s): PROBNP in the last 8760 hours. HbA1C: No results for input(s): HGBA1C in the last 72 hours. CBG: Recent Labs  Lab 06/27/18 0753 06/27/18 1251 06/27/18 1638 06/27/18 2123 06/28/18 0809  GLUCAP 148* 163* 173* 167* 196*   Lipid Profile: No results for input(s): CHOL, HDL, LDLCALC, TRIG, CHOLHDL, LDLDIRECT in the last 72 hours. Thyroid Function Tests: No results for input(s): TSH, T4TOTAL, FREET4, T3FREE, THYROIDAB in the last 72 hours. Anemia Panel: No results for input(s): VITAMINB12, FOLATE, FERRITIN, TIBC, IRON, RETICCTPCT in the last 72 hours. Sepsis Labs: No results for input(s): PROCALCITON, LATICACIDVEN in the last 168 hours.  Recent Results (from the past 240 hour(s))  Aerobic/Anaerobic Culture (surgical/deep wound)     Status: None   Collection Time: 06/21/18  3:00 PM  Result Value Ref Range Status   Specimen Description ABSCESS  Final   Special Requests LOWER LUMBAR  Final   Gram Stain NO WBC SEEN NO ORGANISMS SEEN   Final   Culture   Final  No growth aerobically or anaerobically. Performed at [facility], [place] [address], [place], [place] [address]    Report Status 06/26/2018 FINAL  Final  Urine culture     Status: None   Collection Time: 06/21/18  5:05 PM  Result Value Ref Range Status   Specimen Description URINE, RANDOM  Final   Special Requests NONE  Final   Culture   Final    NO GROWTH Performed at [facility], [place] [address] [place], [place] [address]    Report Status 06/22/2018 FINAL  Final         Radiology Studies: Dg Pelvis 1-2 Views  Result Date: 06/26/2018 CLINICAL DATA:  Pelvic fracture. EXAM: PELVIS - 1-2 VIEW COMPARISON:  MRI pelvis 06/25/2018. Nuclear medicine bone scan 06/25/2018. CT abdomen and pelvis 06/24/2018. FINDINGS: There is heterogeneously increased density in the right ischium with superimposed curvilinear lucency  corresponding to the pathologic fracture shown on the prior studies without significant displacement evident on these AP radiographs. The femoral heads are approximated with the acetabula on these AP radiographs. A remote, healed left femur fracture is again noted with partial visualization of an intramedullary nail and hip screw. IMPRESSION: Known pathologic fracture of the right ischium. Electronically Signed   By: Logan Bores M.D.   On: 06/26/2018 20:46   Ct Biopsy  Result Date: 06/27/2018 CLINICAL DATA:  Liver lesion. Expansile lytic lesion of the right ischium. EXAM: CT GUIDED DEEP CORE BONE BIOPSY OF RIGHT ISCHIAL LESION ANESTHESIA/SEDATION: Intravenous Fentanyl 61mcg and Versed 1mg  were administered as conscious sedation during continuous monitoring of the patient's level of consciousness and physiological / cardiorespiratory status by the radiology RN, with a total moderate sedation time of 8 minutes. PROCEDURE: The procedure risks, benefits, and alternatives were explained to the patient. Questions regarding the procedure were encouraged and answered. The patient understands and consents to the procedure. patient placed left lateral decubitus. select axial scans through the pelvis were obtained. the soft tissue component of the right ischial lesion was localized and an appropriate skin entry site was determined and marked. The operative field was prepped with chlorhexidinein a sterile fashion, and a sterile drape was applied covering the operative field. A sterile gown and sterile gloves were used for the procedure. Local anesthesia was provided with 1% Lidocaine. Under CT fluoroscopic guidance, a 17 gauge trocar needle was advanced to the margin of the lesion. Once needle tip position was confirmed, coaxial 18-gauge core biopsy samples were obtained, submitted in formalin to surgical pathology. The guide needle was removed. Postprocedure scans show no hemorrhage or other apparent complication. The  patient tolerated the procedure well. COMPLICATIONS: None immediate FINDINGS: Soft tissue mass about the right ischium was localized. Representative core biopsy samples obtained as above. IMPRESSION: 1. Technically successful CT-guided deep core bone biopsy, right ischial lesion. Electronically Signed   By: Lucrezia Europe M.D.   On: 06/27/2018 12:58   Vas [place] Lower Extremity Venous (dvt)  Result Date: 06/27/2018  Lower Venous Study Indications: Pelvic mass pressing on veins.  Risk Factors: Cancer patient. Performing Technologist: June Leap RDMS, RVT  Examination Guidelines: A complete evaluation includes B-mode imaging, spectral Doppler, color Doppler, and power Doppler as needed of all accessible portions of each vessel. Bilateral testing is considered an integral part of a complete examination. Limited examinations for reoccurring indications may be performed as noted.  Right Venous Findings: +---------+---------------+---------+-----------+----------+-------+            Compressibility Phasicity Spontaneity Properties Summary  +---------+---------------+---------+-----------+----------+-------+  CFV       Full            Yes       Yes                             +---------+---------------+---------+-----------+----------+-------+  SFJ       Full                                                      +---------+---------------+---------+-----------+----------+-------+  FV Prox   Full                                                      +---------+---------------+---------+-----------+----------+-------+  FV Mid    Full                                                      +---------+---------------+---------+-----------+----------+-------+  FV Distal Full                                                      +---------+---------------+---------+-----------+----------+-------+  PFV       Full                                                      +---------+---------------+---------+-----------+----------+-------+  POP        Full            Yes       Yes                             +---------+---------------+---------+-----------+----------+-------+  PTV       Full                                                      +---------+---------------+---------+-----------+----------+-------+  PERO      Full                                                      +---------+---------------+---------+-----------+----------+-------+  Soleal    Partial                                                   +---------+---------------+---------+-----------+----------+-------+  Left Venous Findings: +---------+---------------+---------+-----------+----------+-------+            Compressibility Phasicity Spontaneity Properties Summary  +---------+---------------+---------+-----------+----------+-------+  CFV       Full            Yes       Yes                             +---------+---------------+---------+-----------+----------+-------+  SFJ       Full                                                      +---------+---------------+---------+-----------+----------+-------+  FV Prox   Full                                                      +---------+---------------+---------+-----------+----------+-------+  FV Mid    Full                                                      +---------+---------------+---------+-----------+----------+-------+  FV Distal Full                                                      +---------+---------------+---------+-----------+----------+-------+  PFV       Full                                                      +---------+---------------+---------+-----------+----------+-------+  POP       Full            Yes       Yes                             +---------+---------------+---------+-----------+----------+-------+  PTV       Full                                                      +---------+---------------+---------+-----------+----------+-------+  PERO      Full                                                       +---------+---------------+---------+-----------+----------+-------+    Summary: Right: Findings consistent with acute deep vein thrombosis involving the right soleal vein. No cystic structure found in the popliteal fossa. Left: There is no evidence of deep vein  thrombosis in the lower extremity. No cystic structure found in the popliteal fossa.  *See table(s) above for measurements and observations. Electronically signed by Monica Martinez MD on 06/27/2018 at 6:20:31 PM.    Final         Scheduled Meds:  clonazePAM  0.5 mg Oral QHS   dexamethasone  20 mg Intravenous Daily   docusate sodium  100 mg Oral BID   enoxaparin (LOVENOX) injection  1 mg/kg Subcutaneous Q12H   feeding supplement  1 Container Oral TID BM   feeding supplement (PRO-STAT SUGAR FREE 64)  30 mL Oral BID WC   fluconazole  100 mg Oral Daily   insulin aspart  0-15 Units Subcutaneous TID WC   metoprolol succinate  50 mg Oral Daily   multivitamin with minerals  1 tablet Oral Daily   oxyCODONE  15 mg Oral Q12H   pantoprazole  40 mg Oral BID   polyethylene glycol  17 g Oral Daily   sodium chloride flush  10-40 mL Intracatheter Q12H   tamsulosin  0.4 mg Oral Daily   Continuous Infusions:  sodium chloride 100 mL/hr at 06/27/18 1255     LOS: 7 days    Time spent: 35 minutes    Elmarie Shiley, MD [facility] Pager [phone]  If 7PM-7AM, please contact night-coverage [email or web address] Password [facility] 06/28/2018, 8:36 AM

## 2018-06-28 NOTE — Progress Notes (Signed)
Physical Therapy Treatment Patient Details Name: Lucas Morales MRN: [id number] DOB: [birth year] Today's Date: 06/28/2018    History of Present Illness This is an 85 year old male with a past medical history including hypertension, depression and anxiety, coronary artery disease, chronic interstitial cystitis, previous left hip fracture, previous cervical fusion, hyperlipidemia, restless leg syndrome, diabetes, arthritis, alpha 1 antitrypsin deficiency carrier, anemia, chronic back pain who was admitted for suspected postoperative lumbar decompression at L3-4 infection.  Surgery date was 05/10/2018. MRI suspicious for osteomyelitis. CT scan revealed R medial acetabular fx as well as lesions on liver suspicious for cancer.     PT Comments    Pt is making slow progress towards his goals today. Pt is less painful and more fluid in his movement. Pt able to ambulate further however is limited by the fact that he can not anteriorly rotate his hips due to painful tumor and is unable to extend his knees due to quad weakness and gastroc tightness. Pt requires modA for steadying with gait and close chair follow due to decreased endurance. PT continues to recommend SNF level care at d/c, however pt reports son is coming from [place] to live with him at d/c.    Follow Up Recommendations  SNF     Equipment Recommendations  Other (comment)       Precautions / Restrictions Precautions Precautions: Fall Restrictions Weight Bearing Restrictions: Yes RLE Weight Bearing: Weight bearing as tolerated    Mobility  Bed Mobility Overal bed mobility: Needs Assistance Bed Mobility: Rolling;Sidelying to Sit Rolling: Max assist Sidelying to sit: Mod assist;+2 for physical assistance;+2 for safety/equipment       General bed mobility comments: pulling on rails gor trunk elevation  Transfers Overall transfer level: Needs assistance Equipment used: Rolling walker (2 wheeled) Transfers: Sit to/from [name] Sit to Stand: Mod assist;+2 physical assistance;+2 safety/equipment Stand pivot transfers: Mod assist;+2 physical assistance;+2 safety/equipment       General transfer comment: assist for lines and pt fatigues quickly requiring +2 for chair follow and stability. Pt impulsive.  Ambulation/Gait Ambulation/Gait assistance: Mod assist;+2 safety/equipment Gait Distance (Feet): 100 Feet(3 segments ) Assistive device: Rolling walker (2 wheeled) Gait Pattern/deviations: Step-through pattern;Decreased step length - right;Decreased step length - left;Trunk flexed     General Gait Details: modA for support with ambulation, decreased ability to anteriorly rotate hips and decreased ability to extend knees are limiting factors along with desire to utilize forward momentum for propulsion, continues to need very close chair follow as fatigues very quickly and becomes unstable         Balance Overall balance assessment: Needs assistance   Sitting balance-Leahy Scale: Fair       Standing balance-Leahy Scale: Poor                              Cognition Arousal/Alertness: Awake/alert Behavior During Therapy: WFL for tasks assessed/performed Overall Cognitive Status: Within Functional Limits for tasks assessed                                 General Comments: Pt's son going to stay with him at home; pt coming around to ideas, but still unrealistic in ideas that he will be walking well again with these masses.      Exercises      General Comments General comments (skin integrity, edema, etc.): HR 120s s/p exertion  Pertinent Vitals/Pain Pain Assessment: Faces Faces Pain Scale: Hurts whole lot Pain Location: back and BLEs Pain Descriptors / Indicators: Cramping;Jabbing;Tightness           PT Goals (current goals can now be found in the care plan section) Acute Rehab PT Goals Patient Stated Goal: to go home PT Goal Formulation: With  patient Time For Goal Achievement: 07/11/18 Potential to Achieve Goals: Fair Progress towards PT goals: Progressing toward goals    Frequency    Min 3X/week      PT Plan Current plan remains appropriate    Co-evaluation PT/OT/SLP Co-Evaluation/Treatment: Yes Reason for Co-Treatment: For patient/therapist safety PT goals addressed during session: Mobility/safety with mobility OT goals addressed during session: ADL's and self-care;Other (comment)(transfers)      AM-PAC PT "6 Clicks" Mobility   Outcome Measure  Help needed turning from your back to your side while in a flat bed without using bedrails?: A Lot Help needed moving from lying on your back to sitting on the side of a flat bed without using bedrails?: Total Help needed moving to and from a bed to a chair (including a wheelchair)?: A Lot Help needed standing up from a chair using your arms (e.g., wheelchair or bedside chair)?: A Lot Help needed to walk in hospital room?: A Lot Help needed climbing 3-5 steps with a railing? : A Lot 6 Click Score: 11    End of Session Equipment Utilized During Treatment: Gait belt Activity Tolerance: Patient limited by pain;Patient limited by fatigue Patient left: in chair;with call bell/phone within reach;with chair alarm set Nurse Communication: Mobility status;Need for lift equipment;Precautions PT Visit Diagnosis: Unsteadiness on feet (R26.81);Other abnormalities of gait and mobility (R26.89);Muscle weakness (generalized) (M62.81);Difficulty in walking, not elsewhere classified (R26.2);Pain Pain - Right/Left: Right Pain - part of body: (back and LE R>L)     Time: 4098-1191 PT Time Calculation (min) (ACUTE ONLY): 33 min  Charges:  $Gait Training: 8-22 mins                     Alondra Sahni B. Beverely Risen PT, DPT Acute Rehabilitation Services Pager [phone] Office [phone]    Elon Alas Fleet 06/28/2018, 5:52 PM

## 2018-06-28 NOTE — Progress Notes (Signed)
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  I would not think that it would be until next week that there would be any results.  To no surprise, he had a Doppler done yesterday of his legs.  There is a thrombus in his right leg.  I am not surprised by this given the MRI findings.  He already was on heparin infusion.  He does have a swollen right leg.  I wonder if a compression stocking that is thigh-high may not be a bad idea for this.  He looks more comfortable.  He feels more comfortable.  He was started on OxyContin.  I also have him on Decadron.  This has helped his appetite.  And hopefully this helped his pain.  Physical therapy saw him.  What a great job that they did.  They recommend skilled nursing.  I really cannot argue with this.  He is on Lovenox now.  I think this is a perfect choice for long-term anticoagulation.  I know that there are studies that have looked at the [place] agents with cancer.  I just think that given what is going on right now, we need to stick with Lovenox as the standard of care.  His labs show a white cell count of 8.3.  Hemoglobin 11.4.  Platelet count 275,000.  His alkaline phosphatase is 472.  His albumin is 2.7.  His BUN is 65 creatinine 1.92.  His physical exam shows vital signs with a temperature of 97 7.  Pulse 109.  Blood pressure 154/94.  His head neck exam shows no ocular or oral lesions.  He has no thrush.  He has no adenopathy in the neck.  Lungs are clear bilaterally.  Cardiac exam tachycardic but regular.  Abdomen is soft.  Bowel sounds are present.  There may be some slight fullness in the right lower quadrant.  Extremities shows the swelling of the right leg.  He does have good pulses bilaterally.  Neurological exam is nonfocal.  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  He had a biopsy yesterday.  I would not anticipate any results back until next week.  If he can get to skilled nursing, that would be fantastic.  We can follow him up as an  outpatient easily enough.  It is essential that he be on Lovenox as an outpatient.  Is essential that he be on Decadron as an outpatient along with Diflucan and Protonix.  Pain control will be critical.  I know everybody up on 6 N. I have done a great job with him.  This has been incredibly complicated case.  Lattie Haw. MD  Lurena Joiner 23:43

## 2018-06-28 NOTE — Progress Notes (Signed)
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 He states his son is coming down from [place] to stay with him and provide care at home. He is concerned about him coming from [place] and being exposed to the Covid-19.  Pt didn't want to discuss further today d/t fatigue. Information left and CM will follow.

## 2018-06-28 NOTE — Progress Notes (Addendum)
Subjective:    Patient reports pain as mild and moderate.    Objective: Vital signs in last 24 hours: Temp:  [97.7 F (36.5 C)-98.4 F (36.9 C)] 97.7 F (36.5 C) (04/10 0350) Pulse Rate:  [95-123] 109 (04/10 0350) Resp:  [18-20] 18 (04/10 0350) BP: (139-172)/(83-104) 154/94 (04/10 0350) SpO2:  [93 %-100 %] 93 % (04/10 0350)  Intake/Output from previous day: 04/09 0701 - 04/10 0700 In: 1238 [P.O.:720; I.V.:518] Out: 750 [Urine:750] Intake/Output this shift: No intake/output data recorded.  Recent Labs    06/26/18 0523 06/27/18 0655 06/28/18 0444  HGB 12.4* 12.3* 11.4*   Recent Labs    06/27/18 0655 06/28/18 0444  WBC 13.7* 8.3  RBC 3.84* 3.61*  HCT 36.3* 34.5*  PLT 300 275   Recent Labs    06/27/18 0655 06/28/18 0444  NA 127* 130*  K 4.6 5.0  CL 95* 99  CO2 18* 18*  BUN 49* 65*  CREATININE 1.94* 1.92*  GLUCOSE 149* 175*  CALCIUM 8.8* 8.8*   No results for input(s): LABPT, INR in the last 72 hours.  LLE. has been OOB to recliner with PT US Renal  Result Date: 06/28/2018 CLINICAL DATA:  Acute kidney injury. EXAM: RENAL / URINARY TRACT ULTRASOUND COMPLETE COMPARISON:  Abdominal MRI 06/25/2018. CT abdomen and pelvis 06/24/2018. FINDINGS: Right Kidney: Renal measurements: 12.1 x 5.4 x 5.3 cm = volume: 180 mL . Echogenicity within normal limits. No mass or hydronephrosis visualized. Left Kidney: Renal measurements: 13.0 x 5.5 x 5.5 cm = volume: 206 mL. Echogenicity within normal limits. No mass or hydronephrosis visualized. Bladder: Decompressed by Foley catheter. IMPRESSION: Negative renal ultrasound. Electronically Signed   By: Logan Bores M.D.   On: 06/28/2018 09:29   Ct Biopsy  Result Date: 06/27/2018 CLINICAL DATA:  Liver lesion. Expansile lytic lesion of the right ischium. EXAM: CT GUIDED DEEP CORE BONE BIOPSY OF RIGHT ISCHIAL LESION ANESTHESIA/SEDATION: Intravenous Fentanyl 54mcg and Versed 1mg  were administered as conscious sedation during continuous  monitoring of the patient's level of consciousness and physiological / cardiorespiratory status by the radiology RN, with a total moderate sedation time of 8 minutes. PROCEDURE: The procedure risks, benefits, and alternatives were explained to the patient. Questions regarding the procedure were encouraged and answered. The patient understands and consents to the procedure. patient placed left lateral decubitus. select axial scans through the pelvis were obtained. the soft tissue component of the right ischial lesion was localized and an appropriate skin entry site was determined and marked. The operative field was prepped with chlorhexidinein a sterile fashion, and a sterile drape was applied covering the operative field. A sterile gown and sterile gloves were used for the procedure. Local anesthesia was provided with 1% Lidocaine. Under CT fluoroscopic guidance, a 17 gauge trocar needle was advanced to the margin of the lesion. Once needle tip position was confirmed, coaxial 18-gauge core biopsy samples were obtained, submitted in formalin to surgical pathology. The guide needle was removed. Postprocedure scans show no hemorrhage or other apparent complication. The patient tolerated the procedure well. COMPLICATIONS: None immediate FINDINGS: Soft tissue mass about the right ischium was localized. Representative core biopsy samples obtained as above. IMPRESSION: 1. Technically successful CT-guided deep core bone biopsy, right ischial lesion. Electronically Signed   By: Lucrezia Europe M.D.   On: 06/27/2018 12:58   Vas [place] Lower Extremity Venous (dvt)  Result Date: 06/27/2018  Lower Venous Study Indications: Pelvic mass pressing on veins.  Risk Factors: Cancer patient. Performing Technologist: Sharee Pimple  Parker RDMS, RVT  Examination Guidelines: A complete evaluation includes B-mode imaging, spectral Doppler, color Doppler, and power Doppler as needed of all accessible portions of each vessel. Bilateral testing is considered  an integral part of a complete examination. Limited examinations for reoccurring indications may be performed as noted.  Right Venous Findings: +---------+---------------+---------+-----------+----------+-------+          CompressibilityPhasicitySpontaneityPropertiesSummary +---------+---------------+---------+-----------+----------+-------+ CFV      Full           Yes      Yes                          +---------+---------------+---------+-----------+----------+-------+ SFJ      Full                                                 +---------+---------------+---------+-----------+----------+-------+ FV Prox  Full                                                 +---------+---------------+---------+-----------+----------+-------+ FV Mid   Full                                                 +---------+---------------+---------+-----------+----------+-------+ FV DistalFull                                                 +---------+---------------+---------+-----------+----------+-------+ PFV      Full                                                 +---------+---------------+---------+-----------+----------+-------+ POP      Full           Yes      Yes                          +---------+---------------+---------+-----------+----------+-------+ PTV      Full                                                 +---------+---------------+---------+-----------+----------+-------+ PERO     Full                                                 +---------+---------------+---------+-----------+----------+-------+ Soleal   Partial                                              +---------+---------------+---------+-----------+----------+-------+  Left Venous  Findings: +---------+---------------+---------+-----------+----------+-------+          CompressibilityPhasicitySpontaneityPropertiesSummary  +---------+---------------+---------+-----------+----------+-------+ CFV      Full           Yes      Yes                          +---------+---------------+---------+-----------+----------+-------+ SFJ      Full                                                 +---------+---------------+---------+-----------+----------+-------+ FV Prox  Full                                                 +---------+---------------+---------+-----------+----------+-------+ FV Mid   Full                                                 +---------+---------------+---------+-----------+----------+-------+ FV DistalFull                                                 +---------+---------------+---------+-----------+----------+-------+ PFV      Full                                                 +---------+---------------+---------+-----------+----------+-------+ POP      Full           Yes      Yes                          +---------+---------------+---------+-----------+----------+-------+ PTV      Full                                                 +---------+---------------+---------+-----------+----------+-------+ PERO     Full                                                 +---------+---------------+---------+-----------+----------+-------+    Summary: Right: Findings consistent with acute deep vein thrombosis involving the right soleal vein. No cystic structure found in the popliteal fossa. Left: There is no evidence of deep vein thrombosis in the lower extremity. No cystic structure found in the popliteal fossa.  *See table(s) above for measurements and observations. Electronically signed by Monica Martinez MD on 06/27/2018 at 6:20:31 PM.    Final     Assessment/Plan:    Plan:   Long TEDS thigh high. Doppler right LE DVT below knee. Continue Lovenox , diflucan, protonix ,flomax and pain meds. SNF next week.   Elta Guadeloupe  Jule Economy 06/28/2018, 10:02 AM  Patient  states he does not want to go to skilled nursing facility.  He states his son is coming down from his home up [place].  Patient has a daughter lives close and she had wanted patient to come home and stay with her after he was discharged.  Patient states he is wanting to be back at his own home with home health or home care.  Might be a candidate for hospice assistance this would have been determined by Dr. Marin Olp and currently his path report are pending.  Patient can talk with care management next week about options for home care whether he goes to his own house or goes to his daughter's house etc.

## 2018-06-28 NOTE — Progress Notes (Signed)
Patient and family relieved that he has finally been heard.  He does not want to go to SNF.  He wants to go home with full home health as well as privately hired assistance.     Sat with patient "Lucas Morales" at bedside.   Pain is controlled when lying still in bed.  He still has significant right buttock pain with movement. Bowels are moving only slowly. He slept very well last night. He states he is forcing himself to eat. Patient asked about getting a covid test for his son as he was traveling to [place] to care for him in his home.    I spoke for 30+ minutes with his daughter [name] on the phone today.  She is trying to make decisions about next steps, and pushed for answers.  I asked her to wait until we have biopsy results back.  Not only will we have more information but Steroids, opioids and PT would have a chance to work over the next few days and her father's mobility may improve somewhat. [name] and her brother [name] are preparing to take care of their father in his home.   PE:  Awake, alert, talkative. CV rrr no m/r/g resp no distress, CTA, no pain with inspiration Abdomen soft, nt, nd LE significant swelling R>L  Assessment:   37 yom with metastatic cancer, pathologic fracture, AKI, hyponatremia.  Biopsy pending.  Recommendations: 1.  Will decreased Klonopin to 0.25 mg qhs 2.  Will add senna tabs QHS to miralax daily. 3.  Continue opioids at current doses 4.  Agree with steroids. 5.  DNR form on chart. 6.  Family preparing to take [name] home with 24 hour care. 7.  PMT will touch base tomorrow as well.  Florentina Jenny, PA-C Palliative Medicine Pager: [phone]  35 min.

## 2018-06-28 NOTE — Progress Notes (Signed)
Occupational Therapy Treatment Patient Details Name: Lucas Morales MRN: [id number] DOB: 06-Sep-1933 Today's Date: 06/28/2018    History of present illness This is an 85 year old male with a past medical history including hypertension, depression and anxiety, coronary artery disease, chronic interstitial cystitis, previous left hip fracture, previous cervical fusion, hyperlipidemia, restless leg syndrome, diabetes, arthritis, alpha 1 antitrypsin deficiency carrier, anemia, chronic back pain who was admitted for suspected postoperative lumbar decompression at L3-4 infection.  Surgery date was 05/10/2018. MRI suspicious for osteomyelitis. CT scan revealed R medial acetabular fx as well as lesions on liver suspicious for cancer.    OT comments  Pt increasing ambulation, but knees still flexed and relying heavily on RW for stability. Pt ambulating ~100' in 3 segments outside of room with modA for stability. Pt sitting EOB with greater stability. Pt fatigues easily and requires immediate chair for relief for seated rest break. Pt performing ADL with increased assist for ADL and mobility in SNF setting. Per chart and per pt, pt's son from up [place] is going to stay with him a for while. Pt unable to tolerate standing >1 min due to fatigue and weakness. Masses are a burden for pain and immobility. Pt requires cues to direct conversation as pt can talk tangentially. OT to follow acutely.     Follow Up Recommendations  SNF;Supervision/Assistance - 24 hour    Equipment Recommendations  3 in 1 bedside commode    Recommendations for Other Services      Precautions / Restrictions Precautions Precautions: Fall Restrictions Weight Bearing Restrictions: Yes RLE Weight Bearing: Weight bearing as tolerated       Mobility Bed Mobility Overal bed mobility: Needs Assistance Bed Mobility: Rolling;Sidelying to Sit Rolling: Max assist Sidelying to sit: Mod assist;+2 for physical assistance;+2 for  safety/equipment       General bed mobility comments: pulling on rails gor trunk elevation  Transfers Overall transfer level: Needs assistance Equipment used: Rolling walker (2 wheeled) Transfers: Sit to/from [facility] Sit to Stand: Mod assist;+2 physical assistance;+2 safety/equipment Stand pivot transfers: Mod assist;+2 physical assistance;+2 safety/equipment       General transfer comment: assist for lines and pt fatigues quickly requiring +2 for chair follow and stability. Pt impulsive.    Balance Overall balance assessment: Needs assistance   Sitting balance-Leahy Scale: Fair       Standing balance-Leahy Scale: Poor                             ADL either performed or assessed with clinical judgement   ADL Overall ADL's : Needs assistance/impaired Eating/Feeding: Set up;Sitting                                   Functional mobility during ADLs: Moderate assistance;+2 for physical assistance;+2 for safety/equipment;Rolling walker;Cueing for safety;Cueing for sequencing General ADL Comments: Pt performing ADL with increased assist per chart.     Vision   Vision Assessment?: No apparent visual deficits   Perception     Praxis      Cognition Arousal/Alertness: Awake/alert Behavior During Therapy: WFL for tasks assessed/performed Overall Cognitive Status: Within Functional Limits for tasks assessed                                 General Comments: Pt's son going to stay  with him at home; pt coming around to ideas, but still unrealistic in ideas that he will be walking well again with these masses.        Exercises     Shoulder Instructions       General Comments HR 120s s/p exertion    Pertinent Vitals/ Pain       Pain Assessment: Faces Faces Pain Scale: Hurts whole lot Pain Location: back and BLEs Pain Descriptors / Indicators: Cramping;Jabbing;Tightness  Home Living                                           Prior Functioning/Environment              Frequency  Min 3X/week        Progress Toward Goals  OT Goals(current goals can now be found in the care plan section)  Progress towards OT goals: Progressing toward goals  Acute Rehab OT Goals Patient Stated Goal: to go home OT Goal Formulation: With patient Time For Goal Achievement: 07/11/18 Potential to Achieve Goals: Good ADL Goals Pt Will Perform Grooming: Independently;sitting Pt Will Perform Upper Body Dressing: with set-up;sitting Pt Will Perform Lower Body Dressing: with supervision;sitting/lateral leans Pt Will Perform Toileting - Clothing Manipulation and hygiene: with supervision;sitting/lateral leans Pt/caregiver will Perform Home Exercise Program: Both right and left upper extremity;With written HEP provided;Independently Additional ADL Goal #1: Pt will state 3 energy conservation strategies to implement for treatment here and home.  Plan Discharge plan remains appropriate    Co-evaluation    PT/OT/SLP Co-Evaluation/Treatment: Yes Reason for Co-Treatment: For patient/therapist safety;To address functional/ADL transfers   OT goals addressed during session: ADL's and self-care;Other (comment)(transfers)      AM-PAC OT "6 Clicks" Daily Activity     Outcome Measure   Help from another person eating meals?: None Help from another person taking care of personal grooming?: None Help from another person toileting, which includes using toliet, bedpan, or urinal?: A Lot Help from another person bathing (including washing, rinsing, drying)?: A Lot Help from another person to put on and taking off regular upper body clothing?: A Little Help from another person to put on and taking off regular lower body clothing?: A Lot 6 Click Score: 17    End of Session Equipment Utilized During Treatment: Gait belt;Rolling walker  OT Visit Diagnosis: Unsteadiness on feet (R26.81);Other  abnormalities of gait and mobility (R26.89);Muscle weakness (generalized) (M62.81)   Activity Tolerance Patient tolerated treatment well   Patient Left in chair;with call bell/phone within reach;with chair alarm set   Nurse Communication Mobility status        Time: 5093-2671 OT Time Calculation (min): 38 min  Charges: OT General Charges $OT Visit: 1 Visit OT Treatments $Neuromuscular Re-education: 8-22 mins  Darryl Nestle) Marsa Aris OTR/L Acute Rehabilitation Services Pager: [phone] Office: [phone]    Lucas Morales 06/28/2018, 4:35 PM

## 2018-06-28 NOTE — Progress Notes (Signed)
   06/28/18 1003  Clinical Encounter Type  Visited With Patient  Visit Type Initial;Spiritual support  Referral From Palliative care team  Consult/Referral To Chaplain  This chaplain responded to PMT consult for spiritual care. The chaplain checked the Pt. chart and introduced herself to Pt. RN-Debra.  The RN informed the chaplain the Pt. has had multiple visitors this morning and is trying to finish his breakfast.  The chaplain introduced herself to the Pt. The Pt. invited the chaplain to return at a later time. The Pt connected the chaplain with his [place] as a talking point next time the chaplain visits.  The chaplain is available for F/U spiritual care as needed.

## 2018-06-28 NOTE — Plan of Care (Signed)
  Problem: Education: ?Goal: Knowledge of General Education information will improve ?Description: Including pain rating scale, medication(s)/side effects and non-pharmacologic comfort measures ?Outcome: Progressing ?  ?Problem: Health Behavior/Discharge Planning: ?Goal: Ability to manage health-related needs will improve ?Outcome: Progressing ?  ?Problem: Clinical Measurements: ?Goal: Will remain free from infection ?Outcome: Progressing ?  ?Problem: Clinical Measurements: ?Goal: Diagnostic test results will improve ?Outcome: Progressing ?  ?Problem: Clinical Measurements: ?Goal: Cardiovascular complication will be avoided ?Outcome: Progressing ?  ?

## 2018-06-28 NOTE — Progress Notes (Signed)
Nutrition Follow-up  DOCUMENTATION CODES:   Not applicable  INTERVENTION:   -Continue Boost Breeze po TID, each supplement provides 250 kcal and 9 grams of protein -Continue 30 ml Prostat BID, each supplement provides 100 kcals and 15 grams protein -Continue MVI with minerals daily -Magic Cup TID with meals, each supplement provides 290 kcals and 9 grams protein -Continue with liberalized diet of regular  NUTRITION DIAGNOSIS:   Increased nutrient needs related to post-op healing as evidenced by estimated needs.  Ongoing  GOAL:   Patient will meet greater than or equal to 90% of their needs  Progressing  MONITOR:   PO intake, Supplement acceptance, Labs, Weight trends, I & O's, Skin  REASON FOR ASSESSMENT:   Malnutrition Screening Tool    ASSESSMENT:   85 year old male lives alone is admitted for suspected postoperative lumbar spine infection s/p surgery for severe stenosis at L3-4  4/7-  CT scan revealed liver lesions, 3 liver tumors, and large mass next pelvis; antibiotics stopped 4/9- s/p CT core lesion biopsy of lytic rt pelvic bone lesion  Reviewed I/O's: +488 ml x 24 hours and +3.2 L since admission  UOP: 750 ml x 24 hours  RD deferred speaking with pt due to fatigue and multiple other staff members visiting him today.   Pt being followed by oncology and palliative care. Pt desires to go home with home care services. Pathology results are pending.   Per chart review, intake has improved. Noted meal completion 40-100%. Pt is inconsistent with supplements, but has taken last two doses of Boost Breeze and Prostat.   Labs reviewed: Na: 130, CBGS: 196-249 (inpatient orders for glycemic control are 0-15 units insulin aspart TID with meals).   Diet Order:   Diet Order            Diet regular Room service appropriate? Yes; Fluid consistency: Thin  Diet effective now              EDUCATION NEEDS:   Not appropriate for education at this time  Skin:  Skin  Assessment: Skin Integrity Issues: Skin Integrity Issues:: Incisions Incisions: posterior pelvis  Last BM:  06/25/18  Height:   Ht Readings from Last 1 Encounters:  06/21/18 6\' 2"  (1.88 m)    Weight:   Wt Readings from Last 1 Encounters:  06/21/18 101.3 kg    Ideal Body Weight:  86.3 kg  BMI:  Body mass index is 28.67 kg/m.  Estimated Nutritional Needs:   Kcal:  2000-2300kcal/day   Protein:  100-120g/day   Fluid:  >2.1L/day     Anaija Wissink A. Jimmye Norman, RD, LDN, [place] Registered Dietitian II Certified Diabetes Care and Education Specialist Pager: [phone] After hours Pager: [phone]

## 2018-06-29 DIAGNOSIS — C799 Secondary malignant neoplasm of unspecified site: Secondary | ICD-10-CM | POA: Diagnosis present

## 2018-06-29 DIAGNOSIS — I82493 Acute embolism and thrombosis of other specified deep vein of lower extremity, bilateral: Secondary | ICD-10-CM

## 2018-06-29 LAB — CBC WITH DIFFERENTIAL/PLATELET
Abs Immature Granulocytes: 0 10*3/uL (ref 0.00–0.07)
Basophils Absolute: 0 10*3/uL (ref 0.0–0.1)
Basophils Relative: 0 %
Eosinophils Absolute: 0 10*3/uL (ref 0.0–0.5)
Eosinophils Relative: 0 %
HCT: 32.5 % — ABNORMAL LOW (ref 39.0–52.0)
Hemoglobin: 10.7 g/dL — ABNORMAL LOW (ref 13.0–17.0)
Lymphocytes Relative: 1 %
Lymphs Abs: 0.1 10*3/uL — ABNORMAL LOW (ref 0.7–4.0)
MCH: 32.7 pg (ref 26.0–34.0)
MCHC: 32.9 g/dL (ref 30.0–36.0)
MCV: 99.4 fL (ref 80.0–100.0)
Monocytes Absolute: 0.1 10*3/uL (ref 0.1–1.0)
Monocytes Relative: 1 %
Neutro Abs: 11.4 10*3/uL — ABNORMAL HIGH (ref 1.7–7.7)
Neutrophils Relative %: 98 %
Platelets: 246 10*3/uL (ref 150–400)
RBC: 3.27 MIL/uL — ABNORMAL LOW (ref 4.22–5.81)
RDW: 15.4 % (ref 11.5–15.5)
WBC: 11.6 10*3/uL — ABNORMAL HIGH (ref 4.0–10.5)
nRBC: 0 /100 WBC
nRBC: 0.2 % (ref 0.0–0.2)

## 2018-06-29 LAB — COMPREHENSIVE METABOLIC PANEL
ALT: 43 U/L (ref 0–44)
AST: 44 U/L — ABNORMAL HIGH (ref 15–41)
Albumin: 2.6 g/dL — ABNORMAL LOW (ref 3.5–5.0)
Alkaline Phosphatase: 415 U/L — ABNORMAL HIGH (ref 38–126)
Anion gap: 13 (ref 5–15)
BUN: 78 mg/dL — ABNORMAL HIGH (ref 8–23)
CO2: 17 mmol/L — ABNORMAL LOW (ref 22–32)
Calcium: 8.4 mg/dL — ABNORMAL LOW (ref 8.9–10.3)
Chloride: 101 mmol/L (ref 98–111)
Creatinine, Ser: 1.75 mg/dL — ABNORMAL HIGH (ref 0.61–1.24)
GFR calc Af Amer: 41 mL/min — ABNORMAL LOW (ref >60)
GFR calc non Af Amer: 35 mL/min — ABNORMAL LOW (ref >60)
Glucose, Bld: 176 mg/dL — ABNORMAL HIGH (ref 70–99)
Potassium: 5.2 mmol/L — ABNORMAL HIGH (ref 3.5–5.1)
Sodium: 131 mmol/L — ABNORMAL LOW (ref 135–145)
Total Bilirubin: 0.6 mg/dL (ref 0.3–1.2)
Total Protein: 5.4 g/dL — ABNORMAL LOW (ref 6.5–8.1)

## 2018-06-29 LAB — GLUCOSE, CAPILLARY
Glucose-Capillary: 171 mg/dL — ABNORMAL HIGH (ref 70–99)
Glucose-Capillary: 218 mg/dL — ABNORMAL HIGH (ref 70–99)
Glucose-Capillary: 156 mg/dL — ABNORMAL HIGH (ref 70–99)
Glucose-Capillary: 188 mg/dL — ABNORMAL HIGH (ref 70–99)

## 2018-06-29 LAB — LACTATE DEHYDROGENASE: LDH: 154 U/L (ref 98–192)

## 2018-06-29 MED ORDER — DEXAMETHASONE SODIUM PHOSPHATE 10 MG/ML IJ SOLN
10.0000 mg | Freq: Every day | INTRAMUSCULAR | Status: DC
  Administered 2018-06-29: 10 mg via INTRAVENOUS
  Filled 2018-06-29: qty 1

## 2018-06-29 MED ORDER — BISACODYL 10 MG RE SUPP: 10.0000 mg | Freq: Every day | RECTAL | Status: DC | PRN

## 2018-06-29 MED ORDER — SODIUM ZIRCONIUM CYCLOSILICATE 5 G PO PACK
5.0000 g | PACK | Freq: Once | ORAL | Status: AC
  Administered 2018-06-29: 5 g via ORAL
  Filled 2018-06-29 (×2): qty 1

## 2018-06-29 MED ORDER — DEXAMETHASONE 4 MG PO TABS
8.0000 mg | ORAL_TABLET | Freq: Every day | ORAL | Status: DC
  Administered 2018-06-30 – 2018-07-09 (×10): 8 mg via ORAL
  Filled 2018-06-29 (×10): qty 2

## 2018-06-29 MED ORDER — CLONAZEPAM 0.125 MG PO TBDP: 0.2500 mg | ORAL_TABLET | Freq: Every evening | ORAL | Status: DC | PRN

## 2018-06-29 MED ORDER — SODIUM BICARBONATE 650 MG PO TABS
1300.0000 mg | ORAL_TABLET | Freq: Two times a day (BID) | ORAL | Status: DC
  Administered 2018-06-29 – 2018-07-19 (×41): 1300 mg via ORAL
  Filled 2018-06-29 (×43): qty 2

## 2018-06-29 MED ORDER — INSULIN GLARGINE 100 UNIT/ML ~~LOC~~ SOLN
10.0000 [IU] | Freq: Every day | SUBCUTANEOUS | Status: DC
  Administered 2018-06-29 – 2018-07-18 (×20): 10 [IU] via SUBCUTANEOUS
  Filled 2018-06-29 (×23): qty 0.1

## 2018-06-29 NOTE — Progress Notes (Signed)
PROGRESS NOTE    Lucas Morales  OIZ:[id number] DOB: 03/16/1934 DOA: 06/21/2018 PCP: Deland Pretty, MD    Brief Narrative:  Lucas Morales is a 85 y.o. male with medical history significant of adenomatous polyps, alpha-1 antitrypsin deficiency carrier, anemia, anxiety, depression, osteoarthritis of the knees and back, easy bruising, chronic bronchitis, chronic lower back pain, CAD, history of frequent diarrhea due to partial colectomy, diverticulosis, episodes of diverticulitis, hemorrhoids, gastritis, hiatal hernia, GERD, esophageal stricture, esophageal dysmotility, history of post CABG paroxysmal A. fib, history of bradycardia, hyperlipidemia, hypertension, OSA not on CPAP due to intolerance, restless leg syndrome, sciatic nerve pain, type 2 diabetes, urinary frequency who had lumbar decompression L3-4 on 05/10/2018 and was admitted about a week ago due to suspected L3 vertebral body osteomyelitis.  However, after reviewing all imaging and getting new imaging studies, it was determined that the patient had a metastatic lesion.  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  He was seen by oncology 3 days ago.  He underwent biopsy of the area with interventional radiology earlier today.  We are seeing him consult due to AKI, worsening LFTs and multiple medical issues.   Developed AKI: Suspect multifactorial secondary to contrast and dehydration.  Assessment & Plan:   Principal Problem:   AKI (acute kidney injury) ([place]) Active Problems:   Essential hypertension   Dyslipidemia   PAF (paroxysmal atrial fibrillation) (HCC)   OSA (obstructive sleep apnea)   Deep venous thrombosis (HCC)   Cancer associated pain   DNR (do not resuscitate)   Type II diabetes mellitus ([place])   Coronary artery disease   Pelvic mass in male  1-AKI: Likely due to decreased oral intake and contrast exposure for CT abdomen and pelvis.  He also had some problem with urine retention. Continue with Foley  catheter. Cr trending down today at 17----1.9/ will decrease IV fluids rate to avoid volume overload.  Strict I's and O's. Renal ultrasound normal.  Pelvic mass, multiple liver masses; Awaiting pathology report. Dr. Marin Olp following. On Decadron to help with pain.  L3 lesion: Likely metastatic disease and not consistent with infection.  Hypertension: Resume metoprolol.  HLD; holding due to transaminases.  Paroxysmal A. Fib: continue with metoprolol.  OSA: Not on CPAP.  Continue with oxygen supplementation.  Acute DVT, right soleal vein: Now on Lovenox continue at discharge. Doppler; Findings consistent with acute deep vein thrombosis involving the right soleal vein.  Cancer associated pain: On oxycodone and OxyContin. Type 2 diabetes: Sliding scale insulin.  Monitor on Decadron.  Coronary artery disease: Resume beta-blocker.  Transaminases: Suspect related to liver masses , stable.   Hyponatremia: Improved with IV fluids.  Hyperkalemia;  Will give a dose of lokelma.   Metabolic acidosis;  Started sodium bicarb.   Nutrition Problem: Increased nutrient needs Etiology: post-op healing    Signs/Symptoms: estimated needs    Interventions: MVI, Prostat, Magic cup, Boost Plus  Estimated body mass index is 28.67 kg/m as calculated from the following:   Height as of this encounter: 6\' 2"  (1.88 m).   Weight as of this encounter: 101.3 kg.   DVT prophylaxis: Lovenox Code Status: DNR Family Communication: Care discussed with patient Disposition Plan; patient declining a skilled nursing facility Home when renal function improved.    Subjective: He denies dyspnea. He does not wants to go to SNF>    Objective: Vitals:   06/27/18 1225 06/27/18 1230 06/27/18 1247 06/29/18 0435  BP: (!) 155/104 (!) 172/92 (!) 154/83 134/80  Pulse: 95  96 100 88  Resp: 18 18 18 18   Temp:   98.4 F (36.9 C) 98.2 F (36.8 C)  TempSrc:   Oral Oral  SpO2: 98% 98% 93% 92%  Weight:       Height:        Intake/Output Summary (Last 24 hours) at 06/29/2018 1107 Last data filed at 06/29/2018 9211 Gross per 24 hour  Intake 3769.09 ml  Output --  Net 3769.09 ml   Filed Weights   06/21/18 1652  Weight: 101.3 kg    Examination:  General exam: NAD Respiratory system: CTA Cardiovascular system: S 1, S 2 RRR. Gastrointestinal system: BS present, soft, nt Central nervous system: non focal Extremities: trace edema Skin: no rashes.    Data Reviewed: I have personally reviewed following labs and imaging studies  CBC: Recent Labs  Lab 06/26/18 0523 06/27/18 0655 06/28/18 0444 06/29/18 0500  WBC 11.6* 13.7* 8.3 11.6*  NEUTROABS  --  11.5* 7.2 11.4*  HGB 12.4* 12.3* 11.4* 10.7*  HCT 37.6* 36.3* 34.5* 32.5*  MCV 95.4 94.5 95.6 99.4  PLT 288 300 275 941   Basic Metabolic Panel: Recent Labs  Lab 06/25/18 0851 06/27/18 0655 06/28/18 0444 06/29/18 0500  NA  --  127* 130* 131*  K  --  4.6 5.0 5.2*  CL  --  95* 99 101  CO2  --  18* 18* 17*  GLUCOSE  --  149* 175* 176*  BUN  --  49* 65* 78*  CREATININE 0.85 1.94* 1.92* 1.75*  CALCIUM  --  8.8* 8.8* 8.4*   GFR: Estimated Creatinine Clearance: 41.3 mL/min (A) (by C-G formula based on SCr of 1.75 mg/dL (H)). Liver Function Tests: Recent Labs  Lab 06/27/18 0655 06/28/18 0444 06/29/18 0500  AST 43* 37 44*  ALT 45* 41 43  ALKPHOS 508* 472* 415*  BILITOT 1.2 0.9 0.6  PROT 6.2* 5.9* 5.4*  ALBUMIN 2.7* 2.7* 2.6*   No results for input(s): LIPASE, AMYLASE in the last 168 hours. No results for input(s): AMMONIA in the last 168 hours. Coagulation Profile: No results for input(s): INR, PROTIME in the last 168 hours. Cardiac Enzymes: Recent Labs  Lab 06/28/18 1025  CKTOTAL 30*   BNP (last 3 results) No results for input(s): PROBNP in the last 8760 hours. HbA1C: No results for input(s): HGBA1C in the last 72 hours. CBG: Recent Labs  Lab 06/28/18 0809 06/28/18 1151 06/28/18 1636 06/28/18 2128  06/29/18 0824  GLUCAP 196* 249* 218* 238* 171*   Lipid Profile: No results for input(s): CHOL, HDL, LDLCALC, TRIG, CHOLHDL, LDLDIRECT in the last 72 hours. Thyroid Function Tests: No results for input(s): TSH, T4TOTAL, FREET4, T3FREE, THYROIDAB in the last 72 hours. Anemia Panel: No results for input(s): VITAMINB12, FOLATE, FERRITIN, TIBC, IRON, RETICCTPCT in the last 72 hours. Sepsis Labs: No results for input(s): PROCALCITON, LATICACIDVEN in the last 168 hours.  Recent Results (from the past 240 hour(s))  Aerobic/Anaerobic Culture (surgical/deep wound)     Status: None   Collection Time: 06/21/18  3:00 PM  Result Value Ref Range Status   Specimen Description ABSCESS  Final   Special Requests LOWER LUMBAR  Final   Gram Stain NO WBC SEEN NO ORGANISMS SEEN   Final   Culture   Final    No growth aerobically or anaerobically. Performed at [facility], [place] [address] [place], [place] [address]    Report Status 06/26/2018 FINAL  Final  Urine culture     Status:  None   Collection Time: 06/21/18  5:05 PM  Result Value Ref Range Status   Specimen Description URINE, RANDOM  Final   Special Requests NONE  Final   Culture   Final    NO GROWTH Performed at [facility], [address] [place], [place] [address]    Report Status 06/22/2018 FINAL  Final         Radiology Studies: US Renal  Result Date: 06/28/2018 CLINICAL DATA:  Acute kidney injury. EXAM: RENAL / URINARY TRACT ULTRASOUND COMPLETE COMPARISON:  Abdominal MRI 06/25/2018. CT abdomen and pelvis 06/24/2018. FINDINGS: Right Kidney: Renal measurements: 12.1 x 5.4 x 5.3 cm = volume: 180 mL . Echogenicity within normal limits. No mass or hydronephrosis visualized. Left Kidney: Renal measurements: 13.0 x 5.5 x 5.5 cm = volume: 206 mL. Echogenicity within normal limits. No mass or hydronephrosis visualized. Bladder: Decompressed by Foley catheter. IMPRESSION: Negative renal ultrasound. Electronically Signed   By:  Logan Bores M.D.   On: 06/28/2018 09:29   Ct Biopsy  Result Date: 06/27/2018 CLINICAL DATA:  Liver lesion. Expansile lytic lesion of the right ischium. EXAM: CT GUIDED DEEP CORE BONE BIOPSY OF RIGHT ISCHIAL LESION ANESTHESIA/SEDATION: Intravenous Fentanyl 85mcg and Versed 1mg  were administered as conscious sedation during continuous monitoring of the patient's level of consciousness and physiological / cardiorespiratory status by the radiology RN, with a total moderate sedation time of 8 minutes. PROCEDURE: The procedure risks, benefits, and alternatives were explained to the patient. Questions regarding the procedure were encouraged and answered. The patient understands and consents to the procedure. patient placed left lateral decubitus. select axial scans through the pelvis were obtained. the soft tissue component of the right ischial lesion was localized and an appropriate skin entry site was determined and marked. The operative field was prepped with chlorhexidinein a sterile fashion, and a sterile drape was applied covering the operative field. A sterile gown and sterile gloves were used for the procedure. Local anesthesia was provided with 1% Lidocaine. Under CT fluoroscopic guidance, a 17 gauge trocar needle was advanced to the margin of the lesion. Once needle tip position was confirmed, coaxial 18-gauge core biopsy samples were obtained, submitted in formalin to surgical pathology. The guide needle was removed. Postprocedure scans show no hemorrhage or other apparent complication. The patient tolerated the procedure well. COMPLICATIONS: None immediate FINDINGS: Soft tissue mass about the right ischium was localized. Representative core biopsy samples obtained as above. IMPRESSION: 1. Technically successful CT-guided deep core bone biopsy, right ischial lesion. Electronically Signed   By: Lucrezia Europe M.D.   On: 06/27/2018 12:58   Vas [place] Lower Extremity Venous (dvt)  Result Date: 06/27/2018  Lower  Venous Study Indications: Pelvic mass pressing on veins.  Risk Factors: Cancer patient. Performing Technologist: June Leap RDMS, RVT  Examination Guidelines: A complete evaluation includes B-mode imaging, spectral Doppler, color Doppler, and power Doppler as needed of all accessible portions of each vessel. Bilateral testing is considered an integral part of a complete examination. Limited examinations for reoccurring indications may be performed as noted.  Right Venous Findings: +---------+---------------+---------+-----------+----------+-------+            Compressibility Phasicity Spontaneity Properties Summary  +---------+---------------+---------+-----------+----------+-------+  CFV       Full            Yes       Yes                             +---------+---------------+---------+-----------+----------+-------+  SFJ       Full                                                      +---------+---------------+---------+-----------+----------+-------+  FV Prox   Full                                                      +---------+---------------+---------+-----------+----------+-------+  FV Mid    Full                                                      +---------+---------------+---------+-----------+----------+-------+  FV Distal Full                                                      +---------+---------------+---------+-----------+----------+-------+  PFV       Full                                                      +---------+---------------+---------+-----------+----------+-------+  POP       Full            Yes       Yes                             +---------+---------------+---------+-----------+----------+-------+  PTV       Full                                                      +---------+---------------+---------+-----------+----------+-------+  PERO      Full                                                      +---------+---------------+---------+-----------+----------+-------+  Soleal     Partial                                                   +---------+---------------+---------+-----------+----------+-------+  Left Venous Findings: +---------+---------------+---------+-----------+----------+-------+            Compressibility Phasicity Spontaneity Properties Summary  +---------+---------------+---------+-----------+----------+-------+  CFV       Full            Yes       Yes                             +---------+---------------+---------+-----------+----------+-------+  SFJ       Full                                                      +---------+---------------+---------+-----------+----------+-------+  FV Prox   Full                                                      +---------+---------------+---------+-----------+----------+-------+  FV Mid    Full                                                      +---------+---------------+---------+-----------+----------+-------+  FV Distal Full                                                      +---------+---------------+---------+-----------+----------+-------+  PFV       Full                                                      +---------+---------------+---------+-----------+----------+-------+  POP       Full            Yes       Yes                             +---------+---------------+---------+-----------+----------+-------+  PTV       Full                                                      +---------+---------------+---------+-----------+----------+-------+  PERO      Full                                                      +---------+---------------+---------+-----------+----------+-------+    Summary: Right: Findings consistent with acute deep vein thrombosis involving the right soleal vein. No cystic structure found in the popliteal fossa. Left: There is no evidence of deep vein thrombosis in the lower extremity. No cystic structure found in the popliteal fossa.  *See table(s) above for measurements and observations. Electronically  signed by Monica Martinez MD on 06/27/2018 at 6:20:31 PM.    Final         Scheduled Meds:  clonazePAM  0.25 mg Oral QHS   dexamethasone  10 mg Intravenous Daily   docusate sodium  100 mg Oral BID   enoxaparin (LOVENOX) injection  1 mg/kg Subcutaneous Q12H  feeding supplement (ENSURE ENLIVE)  237 mL Oral BID BM   feeding supplement (PRO-STAT SUGAR FREE 64)  30 mL Oral BID WC   fluconazole  100 mg Oral Daily   insulin aspart  0-15 Units Subcutaneous TID WC   insulin glargine  10 Units Subcutaneous QHS   metoprolol succinate  50 mg Oral Daily   multivitamin with minerals  1 tablet Oral Daily   oxyCODONE  15 mg Oral Q12H   pantoprazole  40 mg Oral BID   polyethylene glycol  17 g Oral Daily   senna  2 tablet Oral QHS   sodium bicarbonate  1,300 mg Oral BID   sodium chloride flush  10-40 mL Intracatheter Q12H   sodium zirconium cyclosilicate  5 g Oral Once   tamsulosin  0.4 mg Oral Daily   Continuous Infusions:  sodium chloride 50 mL/hr at 06/29/18 0920     LOS: 8 days    Time spent: 35 minutes    Elmarie Shiley, MD [facility] Pager [phone]  If 7PM-7AM, please contact night-coverage [email or web address] Password [facility] 06/29/2018, 11:07 AM

## 2018-06-29 NOTE — Progress Notes (Signed)
   Subjective:    Patient reports pain as mild and moderate.    Objective: Vital signs in last 24 hours: Temp:  [97.6 F (36.4 C)-98.2 F (36.8 C)] 98.2 F (36.8 C) (04/11 0435) Pulse Rate:  [88-117] 88 (04/11 0435) Resp:  [18-20] 18 (04/11 0435) BP: (134)/(80-85) 134/80 (04/11 0435) SpO2:  [92 %] 92 % (04/11 0435)  Intake/Output from previous day: 04/10 0701 - 04/11 0700 In: 3679.2 [P.O.:240; I.V.:3439.2] Out: 300 [Urine:300] Intake/Output this shift: Total I/O In: 329.9 [I.V.:329.9] Out: -   Recent Labs    06/27/18 0655 06/28/18 0444 06/29/18 0500  HGB 12.3* 11.4* 10.7*   Recent Labs    06/28/18 0444 06/29/18 0500  WBC 8.3 11.6*  RBC 3.61* 3.27*  HCT 34.5* 32.5*  PLT 275 246   Recent Labs    06/28/18 0444 06/29/18 0500  NA 130* 131*  K 5.0 5.2*  CL 99 101  CO2 18* 17*  BUN 65* 78*  CREATININE 1.92* 1.75*  GLUCOSE 175* 176*  CALCIUM 8.8* 8.4*   No results for input(s): LABPT, INR in the last 72 hours.  Neurologically intact No results found.  Assessment/Plan:    Up with therapy, pending path. Plan home in a few days . Son coming from [place] today by car but had exposure to corona virus from roommate  . He is going to be assisting with patient when he gets home and son will need corona test to be sure he is negative and does not expose [employer] to [employer].   [name] 06/29/2018, 10:09 AM

## 2018-06-29 NOTE — Progress Notes (Addendum)
Met with patient at bedside.  He is sitting up in his recliner chair working on his lap top.  He is talkative and complains of bad dreams.     He reports no bowel movement for several days despite miralax and senna.  This is somewhat concerning to him as his has increased pain from the tumor with constipation.  Patient has a hemi-colectomy and usually battles diarrhea.  His pain in controlled.  The extra Oxy IR seems to help with movement.  Assessment.  85 yo male, waiting for biopsy results, currently comfortable.  Kidney function, sodium are hopefully trending in a better direction.  Recommendations: 1.  Will decrease steroids and place on an oral dose starting tomorrow.   I defer to Dr. Marin Olp, but he will likely go home on a low maintenance dose of decadron for bone met pain. 2.  Continue senna, miralax, add ducolax suppository for constipation.  Would avoid fleet enema in the setting of AKI 3.  Will change klonopin to PRN.    We discussed discharge plans.  If Dr. Marin Olp recommends chemo or radiation he will go home with full home health services and private care.  If no chemo or radiation is recommended he will go to his home with hospice services.  PMT will follow up on Tuesday 4/14.  Please do not hesitate to call our office if assistance is needed before 4/14.  Florentina Jenny, PA-C Palliative Medicine  Office [phone]

## 2018-06-30 LAB — CBC WITH DIFFERENTIAL/PLATELET
Abs Immature Granulocytes: 0.11 10*3/uL — ABNORMAL HIGH (ref 0.00–0.07)
Basophils Absolute: 0 10*3/uL (ref 0.0–0.1)
Basophils Relative: 0 %
Eosinophils Absolute: 0 10*3/uL (ref 0.0–0.5)
Eosinophils Relative: 0 %
HCT: 31.6 % — ABNORMAL LOW (ref 39.0–52.0)
Hemoglobin: 10.4 g/dL — ABNORMAL LOW (ref 13.0–17.0)
Immature Granulocytes: 2 %
Lymphocytes Relative: 7 %
Lymphs Abs: 0.5 10*3/uL — ABNORMAL LOW (ref 0.7–4.0)
MCH: 32.5 pg (ref 26.0–34.0)
MCHC: 32.9 g/dL (ref 30.0–36.0)
MCV: 98.8 fL (ref 80.0–100.0)
Monocytes Absolute: 0.4 10*3/uL (ref 0.1–1.0)
Monocytes Relative: 7 %
Neutro Abs: 5.4 10*3/uL (ref 1.7–7.7)
Neutrophils Relative %: 84 %
Platelets: 210 10*3/uL (ref 150–400)
RBC: 3.2 MIL/uL — ABNORMAL LOW (ref 4.22–5.81)
RDW: 15.2 % (ref 11.5–15.5)
WBC: 6.4 10*3/uL (ref 4.0–10.5)
nRBC: 0 % (ref 0.0–0.2)

## 2018-06-30 LAB — COMPREHENSIVE METABOLIC PANEL
ALT: 55 U/L — ABNORMAL HIGH (ref 0–44)
AST: 56 U/L — ABNORMAL HIGH (ref 15–41)
Albumin: 2.6 g/dL — ABNORMAL LOW (ref 3.5–5.0)
Alkaline Phosphatase: 399 U/L — ABNORMAL HIGH (ref 38–126)
Anion gap: 12 (ref 5–15)
BUN: 74 mg/dL — ABNORMAL HIGH (ref 8–23)
CO2: 19 mmol/L — ABNORMAL LOW (ref 22–32)
Calcium: 8.2 mg/dL — ABNORMAL LOW (ref 8.9–10.3)
Chloride: 101 mmol/L (ref 98–111)
Creatinine, Ser: 1.59 mg/dL — ABNORMAL HIGH (ref 0.61–1.24)
GFR calc Af Amer: 46 mL/min — ABNORMAL LOW (ref >60)
GFR calc non Af Amer: 40 mL/min — ABNORMAL LOW (ref >60)
Glucose, Bld: 166 mg/dL — ABNORMAL HIGH (ref 70–99)
Potassium: 5.3 mmol/L — ABNORMAL HIGH (ref 3.5–5.1)
Sodium: 132 mmol/L — ABNORMAL LOW (ref 135–145)
Total Bilirubin: 0.5 mg/dL (ref 0.3–1.2)
Total Protein: 5.5 g/dL — ABNORMAL LOW (ref 6.5–8.1)

## 2018-06-30 LAB — GLUCOSE, CAPILLARY
Glucose-Capillary: 138 mg/dL — ABNORMAL HIGH (ref 70–99)
Glucose-Capillary: 193 mg/dL — ABNORMAL HIGH (ref 70–99)
Glucose-Capillary: 197 mg/dL — ABNORMAL HIGH (ref 70–99)
Glucose-Capillary: 134 mg/dL — ABNORMAL HIGH (ref 70–99)

## 2018-06-30 LAB — LACTATE DEHYDROGENASE: LDH: 182 U/L (ref 98–192)

## 2018-06-30 MED ORDER — SODIUM ZIRCONIUM CYCLOSILICATE 10 G PO PACK
10.0000 g | PACK | Freq: Once | ORAL | Status: AC
  Administered 2018-06-30: 10 g via ORAL
  Filled 2018-06-30: qty 1

## 2018-06-30 MED ORDER — BISACODYL 10 MG RE SUPP
10.0000 mg | Freq: Every day | RECTAL | Status: DC | PRN
  Administered 2018-06-30: 10 mg via RECTAL
  Filled 2018-06-30: qty 1

## 2018-06-30 NOTE — Progress Notes (Signed)
CSW spoke with patient who confirmed he prefers [facility] services at discharge when medically stable. Plan is for son to provide care at home. Son may have been exposed to Covid-19 and undergoing test to determine. Patient stated if son is unable to provide care, he will hire aides to assist with care at home. Not amenable to any SNF rehab stint. CSW acknowledged. Will continue to assist as needed should any SW concerns arise.

## 2018-06-30 NOTE — Progress Notes (Signed)
PROGRESS NOTE    Lucas Morales  LEX:[id number] DOB: 1933/10/31 DOA: 06/21/2018 PCP: Deland Pretty, MD    Brief Narrative:  Lucas Morales is a 85 y.o. male with medical history significant of adenomatous polyps, alpha-1 antitrypsin deficiency carrier, anemia, anxiety, depression, osteoarthritis of the knees and back, easy bruising, chronic bronchitis, chronic lower back pain, CAD, history of frequent diarrhea due to partial colectomy, diverticulosis, episodes of diverticulitis, hemorrhoids, gastritis, hiatal hernia, GERD, esophageal stricture, esophageal dysmotility, history of post CABG paroxysmal A. fib, history of bradycardia, hyperlipidemia, hypertension, OSA not on CPAP due to intolerance, restless leg syndrome, sciatic nerve pain, type 2 diabetes, urinary frequency who had lumbar decompression L3-4 on 05/10/2018 and was admitted about a week ago due to suspected L3 vertebral body osteomyelitis.  However, after reviewing all imaging and getting new imaging studies, it was determined that the patient had a metastatic lesion.  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  He was seen by oncology 3 days ago.  He underwent biopsy of the area with interventional radiology.  We are seeing him consult due to AKI, worsening LFTs and multiple medical issues.   Developed AKI: Suspect multifactorial secondary to contrast and dehydration.  Assessment & Plan:   Principal Problem:   AKI (acute kidney injury) ([place]) Active Problems:   Essential hypertension   Dyslipidemia   PAF (paroxysmal atrial fibrillation) (HCC)   OSA (obstructive sleep apnea)   Deep venous thrombosis (HCC)   Cancer associated pain   DNR (do not resuscitate)   Type II diabetes mellitus ([place])   Coronary artery disease   Pelvic mass in male   Metastatic cancer ([place])  1-AKI: Likely due to decreased oral intake and contrast exposure for CT abdomen and pelvis.  He also had some problem with urine retention. Continue with Foley  catheter. Cr trending down today at 17----1.9/ will decrease IV fluids rate to avoid volume overload.  Strict I's and O's. Renal ultrasound normal. Cr has decreased to 1.5   Pelvic mass, multiple liver masses; Awaiting pathology report. Dr. Marin Olp following. On Decadron to help with pain.  L3 lesion: Likely metastatic disease and not consistent with infection.  Hypertension: Resume metoprolol.  HLD; holding due to transaminases.  Paroxysmal A. Fib: continue with metoprolol.  OSA: Not on CPAP.  Continue with oxygen supplementation.  Acute DVT, right soleal vein: Now on Lovenox continue at discharge. Doppler; Findings consistent with acute deep vein thrombosis involving the right soleal vein.  Cancer associated pain: On oxycodone and OxyContin. Type 2 diabetes: Sliding scale insulin.  Monitor on Decadron.  Coronary artery disease: Resume beta-blocker.  Transaminases: Suspect related to liver masses , stable.   Hyponatremia: Improved with IV fluids.  Hyperkalemia;  Will give another  dose of lokelma.  Change diet to renal diet.   Metabolic acidosis;  Started sodium bicarb.  Improved.  Nutrition Problem: Increased nutrient needs Etiology: post-op healing    Signs/Symptoms: estimated needs    Interventions: MVI, Prostat, Magic cup, Boost Plus  Estimated body mass index is 28.67 kg/m as calculated from the following:   Height as of this encounter: 6\' 2"  (1.88 m).   Weight as of this encounter: 101.3 kg.   DVT prophylaxis: Lovenox Code Status: DNR Family Communication: Care discussed with patient Disposition Plan; patient declining a skilled nursing facility Home when renal function improved.    Subjective: He is doing ok,   Objective: Vitals:   06/27/18 1225 06/27/18 1230 06/27/18 1247 06/29/18 0435  BP: [name])  155/104 (!) 172/92 (!) 154/83 134/80  Pulse: 95 96 100 88  Resp: 18 18 18 18   Temp:   98.4 F (36.9 C) 98.2 F (36.8 C)  TempSrc:   Oral  Oral  SpO2: 98% 98% 93% 92%  Weight:      Height:        Intake/Output Summary (Last 24 hours) at 06/30/2018 1229 Last data filed at 06/30/2018 0700 Gross per 24 hour  Intake 1603.11 ml  Output 2900 ml  Net -1296.89 ml   Filed Weights   06/21/18 1652  Weight: 101.3 kg    Examination:  General exam: NAD Respiratory system: CTA Cardiovascular system: S 1, S 2 RRR Gastrointestinal system: BS present, soft, nt Central nervous system: Non focal. Extremities:Trace edema Skin: no rashes.    Data Reviewed: I have personally reviewed following labs and imaging studies  CBC: Recent Labs  Lab 06/26/18 0523 06/27/18 0655 06/28/18 0444 06/29/18 0500 06/30/18 0257  WBC 11.6* 13.7* 8.3 11.6* 6.4  NEUTROABS  --  11.5* 7.2 11.4* 5.4  HGB 12.4* 12.3* 11.4* 10.7* 10.4*  HCT 37.6* 36.3* 34.5* 32.5* 31.6*  MCV 95.4 94.5 95.6 99.4 98.8  PLT 288 300 275 246 662   Basic Metabolic Panel: Recent Labs  Lab 06/25/18 0851 06/27/18 0655 06/28/18 0444 06/29/18 0500 06/30/18 0257  NA  --  127* 130* 131* 132*  K  --  4.6 5.0 5.2* 5.3*  CL  --  95* 99 101 101  CO2  --  18* 18* 17* 19*  GLUCOSE  --  149* 175* 176* 166*  BUN  --  49* 65* 78* 74*  CREATININE 0.85 1.94* 1.92* 1.75* 1.59*  CALCIUM  --  8.8* 8.8* 8.4* 8.2*   GFR: Estimated Creatinine Clearance: 45.5 mL/min (A) (by C-G formula based on SCr of 1.59 mg/dL (H)). Liver Function Tests: Recent Labs  Lab 06/27/18 0655 06/28/18 0444 06/29/18 0500 06/30/18 0257  AST 43* 37 44* 56*  ALT 45* 41 43 55*  ALKPHOS 508* 472* 415* 399*  BILITOT 1.2 0.9 0.6 0.5  PROT 6.2* 5.9* 5.4* 5.5*  ALBUMIN 2.7* 2.7* 2.6* 2.6*   No results for input(s): LIPASE, AMYLASE in the last 168 hours. No results for input(s): AMMONIA in the last 168 hours. Coagulation Profile: No results for input(s): INR, PROTIME in the last 168 hours. Cardiac Enzymes: Recent Labs  Lab 06/28/18 1025  CKTOTAL 30*   BNP (last 3 results) No results for  input(s): PROBNP in the last 8760 hours. HbA1C: No results for input(s): HGBA1C in the last 72 hours. CBG: Recent Labs  Lab 06/29/18 1219 06/29/18 1700 06/29/18 2104 06/30/18 0818 06/30/18 1218  GLUCAP 218* 156* 188* 138* 193*   Lipid Profile: No results for input(s): CHOL, HDL, LDLCALC, TRIG, CHOLHDL, LDLDIRECT in the last 72 hours. Thyroid Function Tests: No results for input(s): TSH, T4TOTAL, FREET4, T3FREE, THYROIDAB in the last 72 hours. Anemia Panel: No results for input(s): VITAMINB12, FOLATE, FERRITIN, TIBC, IRON, RETICCTPCT in the last 72 hours. Sepsis Labs: No results for input(s): PROCALCITON, LATICACIDVEN in the last 168 hours.  Recent Results (from the past 240 hour(s))  Aerobic/Anaerobic Culture (surgical/deep wound)     Status: None   Collection Time: 06/21/18  3:00 PM  Result Value Ref Range Status   Specimen Description ABSCESS  Final   Special Requests LOWER LUMBAR  Final   Gram Stain NO WBC SEEN NO ORGANISMS SEEN   Final   Culture   Final  No growth aerobically or anaerobically. Performed at [facility], [place] [address] [place], [place] [address]    Report Status 06/26/2018 FINAL  Final  Urine culture     Status: None   Collection Time: 06/21/18  5:05 PM  Result Value Ref Range Status   Specimen Description URINE, RANDOM  Final   Special Requests NONE  Final   Culture   Final    NO GROWTH Performed at [facility], [place] [address] [place], [place] [address]    Report Status 06/22/2018 FINAL  Final         Radiology Studies: No results found.      Scheduled Meds: . dexamethasone  8 mg Oral Daily  . docusate sodium  100 mg Oral BID  . enoxaparin (LOVENOX) injection  1 mg/kg Subcutaneous Q12H  . feeding supplement (ENSURE ENLIVE)  237 mL Oral BID BM  . feeding supplement (PRO-STAT SUGAR FREE 64)  30 mL Oral BID WC  . fluconazole  100 mg Oral Daily  . insulin aspart  0-15 Units Subcutaneous TID WC  . insulin glargine  10  Units Subcutaneous QHS  . metoprolol succinate  50 mg Oral Daily  . multivitamin with minerals  1 tablet Oral Daily  . oxyCODONE  15 mg Oral Q12H  . pantoprazole  40 mg Oral BID  . polyethylene glycol  17 g Oral Daily  . senna  2 tablet Oral QHS  . sodium bicarbonate  1,300 mg Oral BID  . sodium chloride flush  10-40 mL Intracatheter Q12H  . sodium zirconium cyclosilicate  10 g Oral Once  . tamsulosin  0.4 mg Oral Daily   Continuous Infusions: . sodium chloride 50 mL/hr at 06/30/18 0518     LOS: 9 days    Time spent: 35 minutes    Elmarie Shiley, MD [facility] Pager [phone]  If 7PM-7AM, please contact night-coverage [email or web address] Password [facility] 06/30/2018, 12:29 PM

## 2018-06-30 NOTE — Plan of Care (Signed)
  Problem: Pain Managment: Goal: General experience of comfort will improve Outcome: Progressing   

## 2018-06-30 NOTE — Progress Notes (Signed)
   Subjective:    Patient reports pain as moderate.    Objective: Vital signs in last 24 hours: Temp:  [97.3 F (36.3 C)-98 F (36.7 C)] 97.8 F (36.6 C) (04/12 0423) Pulse Rate:  [66-85] 66 (04/12 0423) Resp:  [16-18] 18 (04/12 0423) BP: (140-145)/(79-83) 143/83 (04/12 0423) SpO2:  [93 %-96 %] 96 % (04/12 0423)  Intake/Output from previous day: 04/11 0701 - 04/12 0700 In: 2453 [P.O.:1040; I.V.:1413] Out: 2900 [Urine:2900] Intake/Output this shift: No intake/output data recorded.  Recent Labs    06/28/18 0444 06/29/18 0500 06/30/18 0257  HGB 11.4* 10.7* 10.4*   Recent Labs    06/29/18 0500 06/30/18 0257  WBC 11.6* 6.4  RBC 3.27* 3.20*  HCT 32.5* 31.6*  PLT 246 210   Recent Labs    06/29/18 0500 06/30/18 0257  NA 131* 132*  K 5.2* 5.3*  CL 101 101  CO2 17* 19*  BUN 78* 74*  CREATININE 1.75* 1.59*  GLUCOSE 176* 166*  CALCIUM 8.4* 8.2*   No results for input(s): LABPT, INR in the last 72 hours.  Neurologically intact No results found.  Assessment/Plan:    Up with therapy, dulcolax supp.   [name] 06/30/2018, 9:39 AM

## 2018-06-30 NOTE — Progress Notes (Signed)
[place] for Enoxaparin Indication: Blood clots/IVC thrombus and DVT  Allergies  Allergen Reactions  . Morphine Itching    Patient Measurements: Height: 6\' 2"  (188 cm) Weight: 223 lb 4.8 oz (101.3 kg) IBW/kg (Calculated) : 82.2 Heparin Dosing Weight: 101kg  Vital Signs: Temp: 97.8 F (36.6 C) (04/12 0423) Temp Source: Oral (04/12 0423) BP: 143/83 (04/12 0423) Pulse Rate: 66 (04/12 0423)  Labs: Recent Labs    06/28/18 0444 06/28/18 1025 06/29/18 0500 06/30/18 0257  HGB 11.4*  --  10.7* 10.4*  HCT 34.5*  --  32.5* 31.6*  PLT 275  --  246 210  CREATININE 1.92*  --  1.75* 1.59*  CKTOTAL  --  30*  --   --     Estimated Creatinine Clearance: 45.5 mL/min (A) (by C-G formula based on SCr of 1.59 mg/dL (H)).   Assessment: 85 yo male with recent back surgery now found to have possible malignancy. 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 Also has DVT. Transitioned to Lovenox post biopsy.   Patient noted to have AKI that is improving with SCr downward trend at 1.59 today. Good UOP recorded. H/H remains stable. Platelets are within normal limits. No bleeding reported.   Goal of Therapy:  Monitor platelets by anticoagulation protocol: Yes   Plan:  Continue Lovenox 100 mg subq q12hr at 1900 Daily CBC Monitor for bleeding complications and platelet count.   Thanks for allowing pharmacy to be a part of this patient's care.  Sloan Leiter, PharmD, BCPS, BCCCP Clinical Pharmacist Please refer to [facility] for [place] numbers 06/30/2018, 7:54 AM

## 2018-07-01 ENCOUNTER — Ambulatory Visit: Admit: 2018-07-01 | Payer: Medicare Other | Admitting: Radiation Oncology

## 2018-07-01 LAB — CBC WITH DIFFERENTIAL/PLATELET
Abs Immature Granulocytes: 0.18 10*3/uL — ABNORMAL HIGH (ref 0.00–0.07)
Basophils Absolute: 0 10*3/uL (ref 0.0–0.1)
Basophils Relative: 0 %
Eosinophils Absolute: 0 10*3/uL (ref 0.0–0.5)
Eosinophils Relative: 0 %
HCT: 33.2 % — ABNORMAL LOW (ref 39.0–52.0)
Hemoglobin: 11 g/dL — ABNORMAL LOW (ref 13.0–17.0)
Immature Granulocytes: 2 %
Lymphocytes Relative: 8 %
Lymphs Abs: 0.7 10*3/uL (ref 0.7–4.0)
MCH: 32.7 pg (ref 26.0–34.0)
MCHC: 33.1 g/dL (ref 30.0–36.0)
MCV: 98.8 fL (ref 80.0–100.0)
Monocytes Absolute: 0.6 10*3/uL (ref 0.1–1.0)
Monocytes Relative: 8 %
Neutro Abs: 6.4 10*3/uL (ref 1.7–7.7)
Neutrophils Relative %: 82 %
Platelets: 240 10*3/uL (ref 150–400)
RBC: 3.36 MIL/uL — ABNORMAL LOW (ref 4.22–5.81)
RDW: 15.4 % (ref 11.5–15.5)
WBC: 7.9 10*3/uL (ref 4.0–10.5)
nRBC: 0 % (ref 0.0–0.2)

## 2018-07-01 LAB — COMPREHENSIVE METABOLIC PANEL
ALT: 68 U/L — ABNORMAL HIGH (ref 0–44)
AST: 59 U/L — ABNORMAL HIGH (ref 15–41)
Albumin: 2.8 g/dL — ABNORMAL LOW (ref 3.5–5.0)
Alkaline Phosphatase: 443 U/L — ABNORMAL HIGH (ref 38–126)
Anion gap: 13 (ref 5–15)
BUN: 68 mg/dL — ABNORMAL HIGH (ref 8–23)
CO2: 19 mmol/L — ABNORMAL LOW (ref 22–32)
Calcium: 8.1 mg/dL — ABNORMAL LOW (ref 8.9–10.3)
Chloride: 103 mmol/L (ref 98–111)
Creatinine, Ser: 1.45 mg/dL — ABNORMAL HIGH (ref 0.61–1.24)
GFR calc Af Amer: 52 mL/min — ABNORMAL LOW (ref >60)
GFR calc non Af Amer: 45 mL/min — ABNORMAL LOW (ref >60)
Glucose, Bld: 149 mg/dL — ABNORMAL HIGH (ref 70–99)
Potassium: 5.1 mmol/L (ref 3.5–5.1)
Sodium: 135 mmol/L (ref 135–145)
Total Bilirubin: 1 mg/dL (ref 0.3–1.2)
Total Protein: 5.8 g/dL — ABNORMAL LOW (ref 6.5–8.1)

## 2018-07-01 LAB — GLUCOSE, CAPILLARY
Glucose-Capillary: 125 mg/dL — ABNORMAL HIGH (ref 70–99)
Glucose-Capillary: 129 mg/dL — ABNORMAL HIGH (ref 70–99)
Glucose-Capillary: 186 mg/dL — ABNORMAL HIGH (ref 70–99)
Glucose-Capillary: 160 mg/dL — ABNORMAL HIGH (ref 70–99)

## 2018-07-01 LAB — LACTATE DEHYDROGENASE: LDH: 195 U/L — ABNORMAL HIGH (ref 98–192)

## 2018-07-01 MED ORDER — SODIUM ZIRCONIUM CYCLOSILICATE 10 G PO PACK
10.0000 g | PACK | Freq: Once | ORAL | Status: AC
  Administered 2018-07-01: 10 g via ORAL
  Filled 2018-07-01: qty 1

## 2018-07-01 MED ORDER — POLYETHYLENE GLYCOL 3350 17 G PO PACK
17.0000 g | PACK | Freq: Two times a day (BID) | ORAL | Status: DC
  Administered 2018-07-02 – 2018-07-03 (×2): 17 g via ORAL
  Filled 2018-07-01 (×2): qty 1

## 2018-07-01 MED ORDER — LACTULOSE 10 GM/15ML PO SOLN
20.0000 g | Freq: Two times a day (BID) | ORAL | Status: DC
  Administered 2018-07-01 – 2018-07-03 (×4): 20 g via ORAL
  Filled 2018-07-01 (×4): qty 30

## 2018-07-01 NOTE — Plan of Care (Signed)

## 2018-07-01 NOTE — Progress Notes (Signed)
   Subjective:    Patient reports pain as moderate.    Objective: Vital signs in last 24 hours: Temp:  [97.7 F (36.5 C)-98.2 F (36.8 C)] 97.7 F (36.5 C) (04/13 0456) Pulse Rate:  [50-108] 50 (04/13 0456) Resp:  [14-22] 14 (04/13 0456) BP: (143-148)/(77-85) 144/85 (04/13 0456) SpO2:  [95 %-98 %] 96 % (04/13 0456)  Intake/Output from previous day: 04/12 0701 - 04/13 0700 In: 1767.9 [P.O.:720; I.V.:1047.9] Out: 1150 [Urine:1150] Intake/Output this shift: Total I/O In: 225 [P.O.:225] Out: 400 [Urine:400]  Recent Labs    06/29/18 0500 06/30/18 0257 07/01/18 0318  HGB 10.7* 10.4* 11.0*   Recent Labs    06/30/18 0257 07/01/18 0318  WBC 6.4 7.9  RBC 3.20* 3.36*  HCT 31.6* 33.2*  PLT 210 240   Recent Labs    06/30/18 0257 07/01/18 0318  NA 132* 135  K 5.3* 5.1  CL 101 103  CO2 19* 19*  BUN 74* 68*  CREATININE 1.59* 1.45*  GLUCOSE 166* 149*  CALCIUM 8.2* 8.1*   No results for input(s): LABPT, INR in the last 72 hours.  right LE edema less. TEDs thigh high.  No results found.  Assessment/Plan:    Plan :   Labs improved, renal function improving. Discussed with pt medicare power W/C requirements. Path report has not been signed off on yet. I called for preliminary report shows malignancy type still being addressed.   Lucas Morales 07/01/2018, 1:28 PM

## 2018-07-01 NOTE — TOC Initial Note (Addendum)
Transition of Care [facility]) - Initial/Assessment Note    Patient Details  Name: Lucas Morales MRN: [id number] Date of Birth: [birth year]  [facility]) CM/SW Contact:    Marilu Favre, RN Phone Number: 07/01/2018, 11:39 AM  Clinical Narrative:               Update [name] wants [facility] for HHPT and aide. [place] for light weight wheelchair and hospital bed and 3 in 1. Her brother is first contact for delivery of DME Adam [phone], she wants to be first contact for home health. Home health referral given to [facility] with [facility], awaiting call back. DME referral given to [facility] with [facility].     Discussed discharge plan with patient at bedside. PT recommending SNF. Patient wants to go home at discharge . Awaiting BX result. Patient asked NCM to call his daughter [name]. Patient requesting power lift wheelchair at discharge.Explained power lift wheelchair could take months to receive. ( spoke with Brad at [facility]).  Spoke with Annett Gula Cottman via phone.   [name] and her brother do want to take their father home at discharge. They do not want him to go to a SNF at discharge due to no visitors  at [facility].   Patient has raised toilet seat with handles , stand up shower with seat, and 2 walkers.   Explained standard wheelchair vs power lift wheelchair. [name] will discuss which wheelchair with her brother. She is thinking the family will buy standard wheel chair and then ask MD for order for power lift wheelchair. Again also waiting on bx results. Discussed home health vs hospice. Patient's son is in town from [place] and can stay with his father at discharge. Patient's son has 7 more days of quarantine. If patient discharges prior to his son being completed with his quarantine , family planning on hiring assistance through [employer].  At discharge patinet will be going to address in [facility].  Jennifer's email is  [email or web address] Will continue to follow.   Expected Discharge Plan: [place] Barriers to Discharge: Continued Medical Work up   Patient Goals and CMS Choice Patient states their goals for this hospitalization and ongoing recovery are:: to go home  CMS Medicare.gov Compare Post Acute Care list provided to:: Patient Choice offered to / list presented to : Patient  Expected Discharge Plan and Services Expected Discharge Plan: [place]   Discharge Planning Services: CM Consult Post Acute Care Choice: Home Health                            Prior Living Arrangements/Services   Lives with:: Self Patient language and need for interpreter reviewed:: No Do you feel safe going back to the place where you live?: Yes      Need for Family Participation in Patient Care: Yes (Comment) Care giver support system in place?: Yes (comment)   Criminal Activity/Legal Involvement Pertinent to Current Situation/Hospitalization: No - Comment as needed  Activities of Daily Living Home Assistive Devices/Equipment: Walker (specify type), Cane (specify quad or straight), Raised toilet seat with rails, CBG Meter, Eyeglasses ADL Screening (condition at time of admission) Patient's cognitive ability adequate to safely complete daily activities?: Yes Is the patient deaf or have difficulty hearing?: No Does the patient have difficulty seeing, even when wearing glasses/contacts?: No Does the patient have difficulty concentrating, remembering, or making decisions?:  Yes(sometimes due to pain ) Patient able to express need for assistance with ADLs?: Yes Does the patient have difficulty dressing or bathing?: No Independently performs ADLs?: Yes (appropriate for developmental age) Does the patient have difficulty walking or climbing stairs?: Yes Weakness of Legs: Both Weakness of Arms/Hands: None  Permission Sought/Granted Permission sought to share  information with : Case Manager, Family Supports Permission granted to share information with : Yes, Verbal Permission Granted  Share Information with NAME: Annett Gula Broberg           Emotional Assessment Appearance:: Appears stated age Attitude/Demeanor/Rapport: Engaged Affect (typically observed): Accepting Orientation: : Oriented to Self, Oriented to Place, Oriented to  Time, Oriented to Situation      Admission diagnosis:  osteomelytitis lumbar Patient Active Problem List   Diagnosis Date Noted  . Metastatic cancer ([place])   . Pelvic mass in male   . Type II diabetes mellitus ([place]) 06/27/2018  . Coronary artery disease 06/27/2018  . AKI (acute kidney injury) ([place]) 06/27/2018  . Cancer associated pain   . DNR (do not resuscitate)   . Palliative care encounter   . Deep venous thrombosis ([place]) 06/26/2018  . 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 Status post lumbar spine surgery for decompression of spinal cord 05/24/2018  . Abnormal EKG 05/25/2017  . S/P cervical spinal fusion 01/25/2016  . Cervical spinal stenosis 12/20/2015  . Cervical spondylosis 12/20/2015  . Painful orthopaedic hardware left hip with chronic trochanteric bursitis 07/30/2015  . Hip bursitis 07/30/2015  . Trochanteric bursitis of left hip 07/30/2015  . Chest pain with moderate risk of acute coronary syndrome 10/19/2014  . Macular hole, left eye 09/01/2014  . Macular hole of left eye 08/28/2014  . Other pancytopenia ([place])   . Sliding hiatal hernia   . Gastric erosions   . GI bleeding 06/04/2014  . Acute GI bleeding 06/04/2014  . Orthostatic hypotension 06/04/2014  . Acute upper gastrointestinal bleeding 06/04/2014  . Other specified diabetes mellitus without complications ([place])   . Acute blood loss anemia   . Thrombocytopenia ([place])   . Closed left hip fracture ([place]) 04/03/2014  . Fracture, femur ([place]) 04/03/2014  . Femur fracture ([place]) 04/03/2014  . Upper airway cough syndrome  05/16/2013  . OSA (obstructive sleep apnea) 10/21/2012  . Macular hole 10/08/2012  . Preoperative clearance 09/30/2012  . PAF (paroxysmal atrial fibrillation) ([place]) 01/21/2012  . Shortness of breath on exertion 01/11/2012  . Bradycardia, sinus 01/11/2012  . Hx of CABG 01/11/2012  . Essential hypertension   . Dyslipidemia   . ABDOMINAL PAIN-LLQ 02/04/2010  . HEMORRHOIDS 08/31/2008  . ESOPHAGEAL STRICTURE 08/31/2008  . GERD 08/31/2008  . Gastritis and gastroduodenitis 08/31/2008  . HIATAL HERNIA 08/31/2008  . DIVERTICULOSIS, COLON 08/31/2008  . Chronic interstitial cystitis 08/31/2008  . COLONIC POLYPS, ADENOMATOUS, HX OF 08/31/2008   PCP:  Deland Pretty, MD Pharmacy:   [facility] [id number] - [address], [place] AT [facility] [address] [address] [place] [address] Phone: [phone] Fax: [phone]     Social Determinants of Health ([place]) Interventions    Readmission Risk Interventions No flowsheet data found.

## 2018-07-01 NOTE — Progress Notes (Signed)
Occupational Therapy Treatment Patient Details Name: Lucas Morales MRN: [id number] DOB: 11-11-1933 Today's Date: 07/01/2018    History of present illness 85 year old male with a past medical history including hypertension, depression and anxiety, coronary artery disease, chronic interstitial cystitis, previous left hip fracture, previous cervical fusion, hyperlipidemia, restless leg syndrome, diabetes, arthritis, alpha 1 antitrypsin deficiency carrier, anemia, chronic back pain who was admitted for suspected postoperative lumbar decompression at L3-4 infection.  Surgery date was 05/10/2018. MRI suspicious for osteomyelitis. CT scan revealed R medial acetabular fx as well as lesions on liver suspicious for cancer.    OT comments  Pt with scrotal edema noted and sling fabricated at this time. Pt should wear sling supine with pelvic elevation to help with edema control. Ice applied to R hip. Pt noted to have cognitive deficits and could benefit from pill box test this admission. Pt with education on expectations and task for session and asking therapist to stop and further reexplain information. Pt is unable to restate information after several attempts to educate. Pt very focused on remaining supine in the bed but reluctantly engaged in therapy to help with fit of scrotal sling.    Follow Up Recommendations  SNF;Supervision/Assistance - 24 hour    Equipment Recommendations  3 in 1 bedside commode    Recommendations for Other Services      Precautions / Restrictions Precautions Precautions: Fall Restrictions Weight Bearing Restrictions: Yes RLE Weight Bearing: Weight bearing as tolerated       Mobility Bed Mobility Overal bed mobility: Needs Assistance Bed Mobility: Supine to Sit;Sit to Supine     Supine to sit: Mod assist;+2 for physical assistance Sit to supine: +2 for physical assistance;Mod assist      Transfers Overall transfer level: Needs assistance Equipment used:  Rolling walker (2 wheeled) Transfers: Sit to/from [facility] Sit to Stand: Mod assist;+2 safety/equipment;From elevated surface         General transfer comment: modA for sit<>stand from higher bed surface, pivot to [facility] and then mod physical assist from [facility].    Balance Overall balance assessment: Needs assistance   Sitting balance-Leahy Scale: Fair       Standing balance-Leahy Scale: Poor                             ADL either performed or assessed with clinical judgement   ADL Overall ADL's : Needs assistance/impaired Eating/Feeding: Set up                     Lower Body Dressing Details (indicate cue type and reason): noted to have scrotal edema the size of grape fruit with penis edema. pt placed in a scrotal edema sling for elevation. RN educated on skin checks every two hours initially. OT monitoring skin  Toilet Transfer: +2 for physical assistance;Moderate assistance;Stand-pivot;BSC;RW   Toileting- Clothing Manipulation and Hygiene: Total assistance         General ADL Comments: pt agreeable to movement due to edema at scrotum     Vision       Perception     Praxis      Cognition Arousal/Alertness: Awake/alert Behavior During Therapy: WFL for tasks assessed/performed Overall Cognitive Status: Within Functional Limits for tasks assessed                                 General Comments: Pt's  son awaiting COVID testing so pt can d/c home. Pt with decreased understanding of his condition and unrealistic idea of his return to PLOF        Exercises Exercises: General Lower Extremity General Exercises - Lower Extremity Ankle Circles/Pumps: AROM;Both;5 reps;Supine   Shoulder Instructions       General Comments stockinette used to create a scrotal sling with good fit at this time. p    Pertinent Vitals/ Pain       Pain Assessment: 0-10 Pain Score: 9  Pain Location: back, R hip and scrotum  Pain  Descriptors / Indicators: Cramping;Jabbing;Tightness Pain Intervention(s): Monitored during session;Premedicated before session;Repositioned;Ice applied;Limited activity within patient's tolerance  Home Living                                          Prior Functioning/Environment              Frequency  Min 3X/week        Progress Toward Goals  OT Goals(current goals can now be found in the care plan section)  Progress towards OT goals: Progressing toward goals  Acute Rehab OT Goals Patient Stated Goal: to go home OT Goal Formulation: With patient Time For Goal Achievement: 07/11/18 Potential to Achieve Goals: Good ADL Goals Pt Will Perform Grooming: Independently;sitting Pt Will Perform Upper Body Dressing: with set-up;sitting Pt Will Perform Lower Body Dressing: with supervision;sitting/lateral leans Pt Will Perform Toileting - Clothing Manipulation and hygiene: with supervision;sitting/lateral leans Pt/caregiver will Perform Home Exercise Program: Both right and left upper extremity;With written HEP provided;Independently Additional ADL Goal #1: Pt will state 3 energy conservation strategies to implement for treatment here and home.  Plan Discharge plan remains appropriate    Co-evaluation    PT/OT/SLP Co-Evaluation/Treatment: Yes Reason for Co-Treatment: Complexity of the patient's impairments (multi-system involvement);Necessary to address cognition/behavior during functional activity;For patient/therapist safety;To address functional/ADL transfers   OT goals addressed during session: ADL's and self-care;Proper use of Adaptive equipment and DME;Strengthening/ROM      AM-PAC OT "6 Clicks" Daily Activity     Outcome Measure   Help from another person eating meals?: None Help from another person taking care of personal grooming?: None Help from another person toileting, which includes using toliet, bedpan, or urinal?: A Lot Help from another  person bathing (including washing, rinsing, drying)?: A Lot Help from another person to put on and taking off regular upper body clothing?: A Little Help from another person to put on and taking off regular lower body clothing?: A Lot 6 Click Score: 17    End of Session Equipment Utilized During Treatment: Gait belt;Rolling walker  OT Visit Diagnosis: Unsteadiness on feet (R26.81);Other abnormalities of gait and mobility (R26.89);Muscle weakness (generalized) (M62.81)   Activity Tolerance Patient tolerated treatment well   Patient Left in bed;with call bell/phone within reach(refused chair)   Nurse Communication Mobility status;Precautions        Time: 4665-9935 OT Time Calculation (min): 26 min  Charges: OT General Charges $OT Visit: 1 Visit OT Treatments $Self Care/Home Management : 8-22 mins   Jeri Modena, OTR/L  Acute Rehabilitation Services Pager: [phone] Office: [phone] .    Jeri Modena 07/01/2018, 11:13 AM

## 2018-07-01 NOTE — Progress Notes (Signed)
  OT NOTE  RN STAFF  Scrotal sling to be worn to patient's tolerance.   It is beneficial for him to wear as much as possible as it elevated and compresses to decrease edema and pain.   Please check scrotal sling every 4 hours during shift to assess for : *positioning * increased pain * increased redness * increased swelling  If any symptoms above present remove sling for 15 minutes. If symptoms continue - keep the sling removed and notify OT staff [phone] immediately.   Keep scrotum elevated at all times on rolled up towels.  Splint can be cleaned with warm soapy water and air dried.   Jeri Modena, OTR/L  Acute Rehabilitation Services Pager: [phone] Office: [phone] .

## 2018-07-01 NOTE — Care Management (Cosign Needed)
    Durable Medical Equipment  (From admission, onward)         Start     Ordered   07/01/18 1628  For home use only DME Hospital bed  Once    Comments:  Daughter Lawrence Mitch [phone]  Question Answer Comment  Patient has (list medical condition): right pelvic fracture , malignancy   The above medical condition requires: Patient requires the ability to reposition frequently   Head must be elevated greater than: 45 degrees   Bed type Semi-electric   Support Surface: Gel Overlay      07/01/18 1627   07/01/18 1625  For home use only DME lightweight manual wheelchair with seat cushion  Once    Comments:  Patient suffers from right pelvic fracture  which impairs their ability to perform daily activities like ambulating in the home.  A cane will not resolve  issue with performing activities of daily living. A wheelchair will allow patient to safely perform daily activities. Patient is not able to propel themselves in the home using a standard weight wheelchair due to right pelvic . Patient can self propel in the lightweight wheelchair.  Accessories: elevating leg rests (ELRs), wheel locks, extensions and anti-tippers.   07/01/18 1626   07/01/18 1542  For home use only DME standard manual wheelchair with seat cushion  Once    Comments:  Patient suffers from right pelvic fracture  Malignancy which impairs their ability to perform daily activities like ambulating  in the home.  A cane  will not resolve  issue with performing activities of daily living. A wheelchair will allow patient to safely perform daily activities. Patient can safely propel the wheelchair in the home or has a caregiver who can provide assistance.  Accessories: elevating leg rests (ELRs), wheel locks, extensions and anti-tippers.   07/01/18 1544   07/01/18 1541  For home use only DME 3 n 1  Once     07/01/18 1544

## 2018-07-01 NOTE — Progress Notes (Signed)
Called to room by patient.He is complaining of pain in his scrotum which is swollen to almost grapefruit size. Elevated his scrotum with a towel and administered pain med. Emptied 650 cc clear yellow urine from his Foley. Will continue to monitor.

## 2018-07-01 NOTE — Progress Notes (Signed)
PROGRESS NOTE    Lucas Morales  WUJ:[id number] DOB: [birth year] DOA: 06/21/2018 PCP: Deland Pretty, MD    Brief Narrative:  Lucas Morales is a 85 y.o. male with medical history significant of adenomatous polyps, alpha-1 antitrypsin deficiency carrier, anemia, anxiety, depression, osteoarthritis of the knees and back, easy bruising, chronic bronchitis, chronic lower back pain, CAD, history of frequent diarrhea due to partial colectomy, diverticulosis, episodes of diverticulitis, hemorrhoids, gastritis, hiatal hernia, GERD, esophageal stricture, esophageal dysmotility, history of post CABG paroxysmal A. fib, history of bradycardia, hyperlipidemia, hypertension, OSA not on CPAP due to intolerance, restless leg syndrome, sciatic nerve pain, type 2 diabetes, urinary frequency who had lumbar decompression L3-4 on 05/10/2018 and was admitted about a week ago due to suspected L3 vertebral body osteomyelitis.  However, after reviewing all imaging and getting new imaging studies, it was determined that the patient had a metastatic lesion.  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  He was seen by oncology 3 days ago.  He underwent biopsy of the area with interventional radiology.  We are seeing him consult due to AKI, worsening LFTs and multiple medical issues.   Developed AKI: Suspect multifactorial secondary to contrast and dehydration.  Assessment & Plan:   Principal Problem:   AKI (acute kidney injury) ([place]) Active Problems:   Essential hypertension   Dyslipidemia   PAF (paroxysmal atrial fibrillation) (H[facility])   OSA (obstructive sleep apnea)   Deep venous thrombosis (H[facility])   Cancer associated pain   DNR (do not resuscitate)   Type II diabetes mellitus ([place])   Coronary artery disease   Pelvic mass in male   Metastatic cancer ([place])  1-AKI: Likely due to decreased oral intake and contrast exposure for CT abdomen and pelvis.  He also had some problem with urine retention. Continue with Foley  catheter. Cr trending down today at 17----1.9/ will decrease IV fluids rate to avoid volume overload.  Strict I's and O's. Renal ultrasound normal. Cr has decreased to 1.4  Pelvic mass, multiple liver masses; Awaiting pathology report. Dr. Marin Olp following. On Decadron to help with pain.  L3 lesion: Likely metastatic disease and not consistent with infection.  Hypertension: Resume metoprolol.  HLD; holding due to transaminases.  Paroxysmal A. Fib: continue with metoprolol.  OSA: Not on CPAP.  Continue with oxygen supplementation.  Acute DVT, right soleal vein: Now on Lovenox continue at discharge. Doppler; Findings consistent with acute deep vein thrombosis involving the right soleal vein.  Cancer associated pain: On oxycodone and OxyContin. Type 2 diabetes: Sliding scale insulin.  Monitor on Decadron.  Coronary artery disease: Resume beta-blocker.  Transaminases: Suspect related to liver masses , stable.   Hyponatremia: Improved with IV fluids.  Hyperkalemia;  Repeat dose of lokelma.  Change diet to renal diet.   Metabolic acidosis;  Started sodium bicarb.  Improved.  Nutrition Problem: Increased nutrient needs Etiology: post-op healing    Signs/Symptoms: estimated needs    Interventions: MVI, Prostat, Magic cup, Boost Plus  Estimated body mass index is 28.67 kg/m as calculated from the following:   Height as of this encounter: 6\' 2"  (1.88 m).   Weight as of this encounter: 101.3 kg.   DVT prophylaxis: Lovenox Code Status: DNR Family Communication: Care discussed with patient Disposition Plan; patient declining a skilled nursing facility Home when renal function improved.    Subjective: He is complaining right leg pain. Not better   Objective: Vitals:   06/27/18 1225 06/27/18 1230 06/27/18 1247 06/29/18 0435  BP: [name])  155/104 (!) 172/92 (!) 154/83 134/80  Pulse: 95 96 100 88  Resp: 18 18 18 18   Temp:   98.4 F (36.9 C) 98.2 F (36.8 C)   TempSrc:   Oral Oral  SpO2: 98% 98% 93% 92%  Weight:      Height:        Intake/Output Summary (Last 24 hours) at 07/01/2018 1320 Last data filed at 07/01/2018 1016 Gross per 24 hour  Intake 1752.91 ml  Output 1050 ml  Net 702.91 ml   Filed Weights   06/21/18 1652  Weight: 101.3 kg    Examination:  General exam: NAD Respiratory system: CTA Cardiovascular system: S 1, S 2 RRR Gastrointestinal system: BS present, soft, nt Central nervous system: non focal.  Extremities: trace edema Skin: no rashes.    Data Reviewed: I have personally reviewed following labs and imaging studies  CBC: Recent Labs  Lab 06/27/18 0655 06/28/18 0444 06/29/18 0500 06/30/18 0257 07/01/18 0318  WBC 13.7* 8.3 11.6* 6.4 7.9  NEUTROABS 11.5* 7.2 11.4* 5.4 6.4  HGB 12.3* 11.4* 10.7* 10.4* 11.0*  HCT 36.3* 34.5* 32.5* 31.6* 33.2*  MCV 94.5 95.6 99.4 98.8 98.8  PLT 300 275 246 210 161   Basic Metabolic Panel: Recent Labs  Lab 06/27/18 0655 06/28/18 0444 06/29/18 0500 06/30/18 0257 07/01/18 0318  NA 127* 130* 131* 132* 135  K 4.6 5.0 5.2* 5.3* 5.1  CL 95* 99 101 101 103  CO2 18* 18* 17* 19* 19*  GLUCOSE 149* 175* 176* 166* 149*  BUN 49* 65* 78* 74* 68*  CREATININE 1.94* 1.92* 1.75* 1.59* 1.45*  CALCIUM 8.8* 8.8* 8.4* 8.2* 8.1*   GFR: Estimated Creatinine Clearance: 49.9 mL/min (A) (by C-G formula based on SCr of 1.45 mg/dL (H)). Liver Function Tests: Recent Labs  Lab 06/27/18 0655 06/28/18 0444 06/29/18 0500 06/30/18 0257 07/01/18 0318  AST 43* 37 44* 56* 59*  ALT 45* 41 43 55* 68*  ALKPHOS 508* 472* 415* 399* 443*  BILITOT 1.2 0.9 0.6 0.5 1.0  PROT 6.2* 5.9* 5.4* 5.5* 5.8*  ALBUMIN 2.7* 2.7* 2.6* 2.6* 2.8*   No results for input(s): LIPASE, AMYLASE in the last 168 hours. No results for input(s): AMMONIA in the last 168 hours. Coagulation Profile: No results for input(s): INR, PROTIME in the last 168 hours. Cardiac Enzymes: Recent Labs  Lab 06/28/18 1025  CKTOTAL  30*   BNP (last 3 results) No results for input(s): PROBNP in the last 8760 hours. HbA1C: No results for input(s): HGBA1C in the last 72 hours. CBG: Recent Labs  Lab 06/30/18 1218 06/30/18 1715 06/30/18 2117 07/01/18 0732 07/01/18 1151  GLUCAP 193* 197* 134* 125* 129*   Lipid Profile: No results for input(s): CHOL, HDL, LDLCALC, TRIG, CHOLHDL, LDLDIRECT in the last 72 hours. Thyroid Function Tests: No results for input(s): TSH, T4TOTAL, FREET4, T3FREE, THYROIDAB in the last 72 hours. Anemia Panel: No results for input(s): VITAMINB12, FOLATE, FERRITIN, TIBC, IRON, RETICCTPCT in the last 72 hours. Sepsis Labs: No results for input(s): PROCALCITON, LATICACIDVEN in the last 168 hours.  Recent Results (from the past 240 hour(s))  Aerobic/Anaerobic Culture (surgical/deep wound)     Status: None   Collection Time: 06/21/18  3:00 PM  Result Value Ref Range Status   Specimen Description ABSCESS  Final   Special Requests LOWER LUMBAR  Final   Gram Stain NO WBC SEEN NO ORGANISMS SEEN   Final   Culture   Final    No growth aerobically or anaerobically. Performed  at [facility], [place] [address] [place], [place] [address]    Report Status 06/26/2018 FINAL  Final  Urine culture     Status: None   Collection Time: 06/21/18  5:05 PM  Result Value Ref Range Status   Specimen Description URINE, RANDOM  Final   Special Requests NONE  Final   Culture   Final    NO GROWTH Performed at [facility], [place] [address] [place], [place] [address]    Report Status 06/22/2018 FINAL  Final         Radiology Studies: No results found.      Scheduled Meds: . dexamethasone  8 mg Oral Daily  . docusate sodium  100 mg Oral BID  . enoxaparin (LOVENOX) injection  1 mg/kg Subcutaneous Q12H  . feeding supplement (ENSURE ENLIVE)  237 mL Oral BID BM  . feeding supplement (PRO-STAT SUGAR FREE 64)  30 mL Oral BID WC  . fluconazole  100 mg Oral Daily  . insulin aspart  0-15 Units  Subcutaneous TID WC  . insulin glargine  10 Units Subcutaneous QHS  . lactulose  20 g Oral BID  . metoprolol succinate  50 mg Oral Daily  . multivitamin with minerals  1 tablet Oral Daily  . oxyCODONE  15 mg Oral Q12H  . pantoprazole  40 mg Oral BID  . polyethylene glycol  17 g Oral Daily  . senna  2 tablet Oral QHS  . sodium bicarbonate  1,300 mg Oral BID  . sodium chloride flush  10-40 mL Intracatheter Q12H  . tamsulosin  0.4 mg Oral Daily   Continuous Infusions: . sodium chloride 50 mL/hr at 07/01/18 0013     LOS: 10 days    Time spent: 35 minutes    Elmarie Shiley, MD [facility] Pager [phone]  If 7PM-7AM, please contact night-coverage [email or web address] Password [email or web address] 07/01/2018, 1:20 PM

## 2018-07-01 NOTE — Progress Notes (Signed)
Writer placed pt.'s wallet in top drawer of dresser on left side of bed during visit. As of 5:46 pm wallet is still in same place. Gerald Stabs, Therapist, sports.

## 2018-07-01 NOTE — Progress Notes (Signed)
Physical Therapy Treatment Patient Details Name: Lucas Morales MRN: [id number] DOB: 06/22/1933 Today's Date: 07/01/2018    History of Present Illness This is an 85 year old male with a past medical history including hypertension, depression and anxiety, coronary artery disease, chronic interstitial cystitis, previous left hip fracture, previous cervical fusion, hyperlipidemia, restless leg syndrome, diabetes, arthritis, alpha 1 antitrypsin deficiency carrier, anemia, chronic back pain who was admitted for suspected postoperative lumbar decompression at L3-4 infection.  Surgery date was 05/10/2018. MRI suspicious for osteomyelitis. CT scan revealed R medial acetabular fx as well as lesions on liver suspicious for cancer.     PT Comments    Pt limited in participation by 9/10 pain in scrotum, lower back and R hip. OT fabricated scrotal sling during session to improve fluid return to reduce swelling. Pt requires modAx2 for bed mobility, transfers and ambulation. Pt continues to have decreased awareness of his level of deconditioning and ability to maintain safety with mobility. PT continues to recommend SNF level care at d/c, however pt's son is here from [place] and pending negative COVID testing will provide 24 hour support to pt. In which case in order to maximize safety in his home, pt will need a wheelchair at d/c.     Follow Up Recommendations  SNF     Equipment Recommendations  Other (comment)(TBD)       Precautions / Restrictions Precautions Precautions: Fall Restrictions Weight Bearing Restrictions: Yes RLE Weight Bearing: Weight bearing as tolerated    Mobility  Bed Mobility Overal bed mobility: Needs Assistance Bed Mobility: Supine to Sit;Sit to Supine           General bed mobility comments: Pt declines rolling today due to swollen scrotum, pt requires modAx2 for LE <>floor, and trunk to upright  Transfers Overall transfer level: Needs assistance Equipment used:  Rolling walker (2 wheeled) Transfers: Sit to/from [employer] Sit to Stand: Mod assist;+2 safety/equipment;From elevated surface         General transfer comment: modA for sit<>stand from higher bed surface, pivot to [facility] and then mod physical assist from [facility].  Ambulation/Gait Ambulation/Gait assistance: Mod assist;+2 safety/equipment Gait Distance (Feet): 12 Feet Assistive device: Rolling walker (2 wheeled) Gait Pattern/deviations: Step-through pattern;Decreased step length - right;Decreased step length - left;Trunk flexed Gait velocity: slowed Gait velocity interpretation: <1.31 ft/sec, indicative of household ambulator General Gait Details: modA for support with short distance ambulation, requires max vc for upright posture and knee extension, continues to have decreased awareness of his lack of strength/safety, c/o of back weakness, however when requested to sit in recliner to improve core strength, pt reports is too painful to sit in chair, continued education on maintaining strength        Balance Overall balance assessment: Needs assistance   Sitting balance-Leahy Scale: Fair       Standing balance-Leahy Scale: Poor                              Cognition Arousal/Alertness: Awake/alert Behavior During Therapy: WFL for tasks assessed/performed Overall Cognitive Status: Within Functional Limits for tasks assessed                                 General Comments: Pt's son awaiting COVID testing so pt can d/c home. Pt with decreased understanding of his condition and unrealistic idea of his return to PLOF  Exercises General Exercises - Lower Extremity Ankle Circles/Pumps: AROM;Both;5 reps;Supine    General Comments General comments (skin integrity, edema, etc.): OT fabricated a scrotal sling during session to improve return of fluid to core, educate pt on need for elevation, unable to maintain optimal positioning due to pain.  Pt with 3/4 DoE with ambulation, SaO2 96%O2 on RA,       Pertinent Vitals/Pain Pain Assessment: 0-10 Pain Score: 9  Pain Location: back, R hip and scrotum  Pain Descriptors / Indicators: Cramping;Jabbing;Tightness Pain Intervention(s): Limited activity within patient's tolerance;Monitored during session;Premedicated before session;Repositioned           PT Goals (current goals can now be found in the care plan section) Acute Rehab PT Goals Patient Stated Goal: to go home PT Goal Formulation: With patient Time For Goal Achievement: 07/11/18 Potential to Achieve Goals: Fair    Frequency    Min 3X/week      PT Plan Current plan remains appropriate    Co-evaluation PT/OT/SLP Co-Evaluation/Treatment: Yes Reason for Co-Treatment: For patient/therapist safety          AM-PAC PT "6 Clicks" Mobility   Outcome Measure  Help needed turning from your back to your side while in a flat bed without using bedrails?: A Lot Help needed moving from lying on your back to sitting on the side of a flat bed without using bedrails?: Total Help needed moving to and from a bed to a chair (including a wheelchair)?: A Lot Help needed standing up from a chair using your arms (e.g., wheelchair or bedside chair)?: A Lot Help needed to walk in hospital room?: A Lot Help needed climbing 3-5 steps with a railing? : A Lot 6 Click Score: 11    End of Session   Activity Tolerance: Patient limited by pain;Patient limited by fatigue Patient left: in chair;with call bell/phone within reach;with chair alarm set Nurse Communication: Mobility status;Need for lift equipment;Precautions PT Visit Diagnosis: Unsteadiness on feet (R26.81);Other abnormalities of gait and mobility (R26.89);Muscle weakness (generalized) (M62.81);Difficulty in walking, not elsewhere classified (R26.2);Pain Pain - Right/Left: Right Pain - part of body: (back and LE R>L)     Time: 1610-9604 PT Time Calculation (min) (ACUTE  ONLY): 26 min  Charges:  $Gait Training: 8-22 mins                     Kasie Leccese B. Beverely Risen PT, DPT Acute Rehabilitation Services Pager [phone] Office [phone]    [facility] 07/01/2018, 9:37 AM

## 2018-07-01 NOTE — Consult Note (Addendum)
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 Diagnoses of Preop testing, Pelvic mass in male, Pelvic fracture ([place]), Acute deep vein thrombosis (DVT) of other specified vein of both lower extremities ([place]), Gastritis and gastroduodenitis, and AKI (acute kidney injury) ([place]) were also pertinent to this visit.   HISTORY OF PRESENT ILLNESS: Lucas Morales is a 85 y.o. male seen at the request of Dr. Marin Olp for a newly noted malignancy.  The patient has been in his usual state of health and in February 2020 underwent revision of the lumbar decompression at L3-4 with Dr. Lorin Mercy.  He was doing well but slowly developed increasing lower extremity weakness and difficulty with low pelvic and right hip pain.  He ultimately was evaluated on 06/21/2018 with interventional radiology after additional imaging revealed concerns within the lumbar spine for fluid accumulation concerning for abscess.  This was drained on 06/21/2018 and was negative for infectious organisms. A CT abdomen pelvis on 06/24/2018 revealed a mass measuring 11.5 x 9.7 cm along the right acetabulum and a to the ischio bone consistent with changes also of the bone of pathologic fracture.  There also appeared to be a lesion in the right hepatic lobe measuring 3.5 x 2.6 cm as well as a 15 mm right liver lesion.  He underwent MRI of the liver and pelvis on 06/25/2018 revealing a 6 cm right sided liver mass invading the and inferior vena cava with tumor thrombus, there was edema in the retroperitoneum, and bone scan revealed uptake in the right hip and ischio with soft tissue mass surrounding, there is also focal uptake of the proximal right humeral shaft  concerning for metastatic disease.  No other evidence of bony disease was present.  CT chest with contrast also revealed multiple pulmonary nodules including a dominant right upper lobe lesion measuring 18 x 13 mm in the right upper lobe.  Again noted was his right hepatic lobe lesion, IVC extension of tumor and intrahepatic biliary dilatation.  He underwent a CT-guided biopsy of the right ischio lesion on 06/27/2018, and pathology is still pending though initial review was felt to be malignant finding.  He is currently being managed for acute kidney injury, urinary retention, pain secondary to his disease in the hip, and his tumor thrombus.  The patient is being evaluated by our service to consider palliative radiotherapy.   PREVIOUS RADIATION THERAPY: No   PAST MEDICAL HISTORY:  Past Medical History:  Diagnosis Date   Adenomatous colon polyp    Alpha-1-antitrypsin deficiency ([place])    Alpha-1-antitrypsin deficiency carrier    Anemia    Anxiety    Arthritis    "knees; bad in my back" (09/01/2014)   Bruises easily    Chronic bronchitis ([place])    "get it pretty much q yr" (09/01/2014)   Chronic lower back pain    Coronary artery disease    Depression    "related to health" (09/01/2014)   Diarrhea    has had a part of colon removed   Diverticulitis    Diverticulosis    Dyspnea on exertion    Dysrhythmia    bradycardia in the  30's (01/11/2012).. Afib briefly after CABG   Esophageal dysmotility    Esophageal stricture    Gastritis    GERD (gastroesophageal reflux disease)    takes Pantoprazole daily   Gout    Hemorrhoids    Hiatal hernia    History of colon polyps    Hyperlipidemia    doesn't require meds   Hypertension    takes Losartan daily; current not taking    IC (interstitial cystitis)    Insomnia    takes Nortrypyline nightly   Joint pain    Joint swelling    Macular hole of both eyes    Nocturia    OSA (obstructive sleep apnea)     "mild; tried CPAP; couldn't tolerate" (09/01/2014)   Paroxysmal atrial fibrillation (H[facility])    Restless leg syndrome    Sciatic nerve pain    Type II diabetes mellitus ([place])    "borderline"   Urinary frequency        PAST SURGICAL HISTORY: Past Surgical History:  Procedure Laterality Date   25 GAUGE PARS PLANA VITRECTOMY WITH 20 GAUGE MVR PORT FOR MACULAR HOLE Right 10/08/2012   Procedure: 25 GAUGE PARS PLANA VITRECTOMY WITH 20 GAUGE MVR PORT FOR MACULAR HOLE; Membrame peel, serum patch; Laser treatment ; Gas exchange;  Surgeon: Hayden Pedro, MD;  Location: [facility];  Service: Ophthalmology;  Laterality: Right;   25 GAUGE PARS PLANA VITRECTOMY WITH 20 GAUGE MVR PORT FOR MACULAR HOLE Left 09/01/2014   Procedure: 25 GAUGE PARS PLANA VITRECTOMY WITH 20 GAUGE MVR PORT FOR MACULAR HOLE;  Surgeon: Hayden Pedro, MD;  Location: [facility];  Service: Ophthalmology;  Laterality: Left;   ANTERIOR CERVICAL DECOMP/DISCECTOMY FUSION N/A 12/20/2015   Procedure: C5-6 Anterior Cervical Discectomy and Fusion, Allograft, and Plate;  Surgeon: [name], MD;  Location: [facility];  Service: Orthopedics;  Laterality: N/A;   APPENDECTOMY  2009   bladder lesion removed     CARDIAC CATHETERIZATION  01/11/2012   CATARACT EXTRACTION W/ INTRAOCULAR LENS  IMPLANT, BILATERAL Bilateral 2011   CHOLECYSTECTOMY  2009   COLONOSCOPY     COMPRESSION HIP SCREW Left 04/04/2014   Procedure: COMPRESSION HIP LEFT;  Surgeon: Mcarthur Rossetti, MD;  Location: [facility];  Service: Orthopedics;  Laterality: Left;   CORONARY ANGIOPLASTY     CORONARY ARTERY BYPASS GRAFT  01/18/2012   Procedure: CORONARY ARTERY BYPASS GRAFTING (CABG);  Surgeon: Ivin Poot, MD;  Location: [facility];  Service: Open Heart Surgery;  Laterality: N/A;  times four, using left internal mammary artery   CYSTOSCOPY WITH URETEROSCOPY  2008; 2009   "painted inside of bladder wall"   ELBOW SURGERY Right 1988   "reconstructive OR"   ENDOVEIN HARVEST  OF GREATER SAPHENOUS VEIN  01/18/2012   Procedure: ENDOVEIN HARVEST OF GREATER SAPHENOUS VEIN;  Surgeon: Ivin Poot, MD;  Location: [facility];  Service: Open Heart Surgery;  Laterality: Right;   ESOPHAGOGASTRODUODENOSCOPY     ESOPHAGOGASTRODUODENOSCOPY N/A 06/05/2014   Procedure: ESOPHAGOGASTRODUODENOSCOPY (EGD);  Surgeon: Irene Shipper, MD;  Location: [facility];  Service: Endoscopy;  Laterality: N/A;   EXCISION/RELEASE BURSA HIP Left 07/30/2015   Procedure: EXCISION/RELEASE BURSA HIP;  Surgeon: Mcarthur Rossetti, MD;  Location: [facility];  Service: Orthopedics;  Laterality: Left;   EXPLORATORY LAPAROTOMY  2009   with right colectomy   FRACTURE SURGERY     GAS INSERTION Left 09/01/2014   Procedure: INSERTION OF GAS;  Surgeon: Hayden Pedro, MD;  Location: [facility];  Service: Ophthalmology;  Laterality: Left;  C3F8   GAS/FLUID EXCHANGE Left 09/01/2014   Procedure: GAS/FLUID EXCHANGE;  Surgeon: Hayden Pedro, MD;  Location: [facility];  Service: Ophthalmology;  Laterality: Left;   HARDWARE REMOVAL Left 07/30/2015   Procedure: attempted LEFT HIP HARDWARE REMOVAL, LEFT HIP BURSECTOMY;  Surgeon: Mcarthur Rossetti, MD;  Location: [facility];  Service: Orthopedics;  Laterality: Left;   HEEL SPUR EXCISION Bilateral 1970's   INTERTROCHANTERIC HIP FRACTURE SURGERY Left 04/04/2014   IR US GUIDE BX ASP/DRAIN  06/21/2018   JOINT REPLACEMENT     LEFT HEART CATH AND CORS/GRAFTS ANGIOGRAPHY N/A 05/28/2017   Procedure: LEFT HEART CATH AND CORS/GRAFTS ANGIOGRAPHY;  Surgeon: Belva Crome, MD;  Location: [facility];  Service: Cardiovascular;  Laterality: N/A;   LEFT HEART CATHETERIZATION WITH CORONARY ANGIOGRAM N/A 01/11/2012   Procedure: LEFT HEART CATHETERIZATION WITH CORONARY ANGIOGRAM;  Surgeon: Leonie Man, MD;  Location[facility];  Service: Cardiovascular;  Laterality: N/A;   LUMBAR DISC SURGERY  ?2010   "cleaned out the stenosis" (01/11/2012)   LUMBAR LAMINECTOMY/DECOMPRESSION  MICRODISCECTOMY N/A 05/10/2018   Procedure: redo lumbar decompression L3-4;  Surgeon: [name], MD;  Location: [facility];  Service: Orthopedics;  Laterality: N/A;   MEMBRANE PEEL Left 09/01/2014   Procedure: MEMBRANE PEEL;  Surgeon: Hayden Pedro, MD;  Location: [facility];  Service: Ophthalmology;  Laterality: Left;   NM MYOCAR MULTIPLE W/SPECT  04/09/2012   NORMAL STRESS NUCLEAR STUDY. EF 51%.   PARS PLANA VITRECTOMY W/ REPAIR OF MACULAR HOLE Left 09/01/2014   PARTIAL COLECTOMY     d/t diverticulosis   PHOTOCOAGULATION WITH LASER Left 09/01/2014   Procedure: PHOTOCOAGULATION WITH LASER;  Surgeon: Hayden Pedro, MD;  Location: [facility];  Service: Ophthalmology;  Laterality: Left;  HEADSCOPE LASER   RECONSTRUCTION MEDIAL COLLATERAL LIGAMENT ELBOW W/ TENDON GRAFT Right 1988 X 2   SERUM PATCH Left 09/01/2014   Procedure: SERUM PATCH;  Surgeon: Hayden Pedro, MD;  Location: [facility];  Service: Ophthalmology;  Laterality: Left;   TONSILLECTOMY AND ADENOIDECTOMY  ~1943   TOTAL KNEE ARTHROPLASTY Bilateral ~ 2001; 2011   right; left   TRANSTHORACIC ECHOCARDIOGRAM  01/04/2012   MODERATE SEVERE-SEVERE LVH. EF=>55%. MILDLY IMPAIRED LV RELAXATION. LA IS MODERATE TO SEVERE DILATED. MODERATE MR. MV LEAFLETS APPEAR THICHENED.   TUMOR EXCISION Left 12/2011   arm     FAMILY HISTORY:  Family History  Problem Relation Age of Onset   Emphysema Mother    Kidney disease Father    Emphysema Maternal Grandfather    Colon cancer Neg Hx      SOCIAL HISTORY:  reports that he has never smoked. He has never used smokeless tobacco. He reports current alcohol use. He reports that he does not use drugs. The patient is divorced and lives in [place].   ALLERGIES: Morphine   MEDICATIONS:  Current Facility-Administered Medications  Medication Dose Route Frequency Provider Last Rate Last Dose   0.9 %  sodium chloride infusion   Intravenous Continuous Regalado, Belkys A, MD 45 mL/hr at 07/01/18 1413      alum & mag hydroxide-simeth (MAALOX/MYLANTA) 200-200-20 MG/5ML suspension 30 mL  30 mL Oral Q4H PRN [name], MD   30 mL at 06/26/18 2150   bisacodyl (DULCOLAX) suppository 10 mg  10 mg Rectal Daily PRN [name], MD   10 mg at 06/30/18 1436   clonazePAM (KLONOPIN) disintegrating tablet 0.25 mg  0.25 mg Oral QHS  PRN Dellinger, Bobby Rumpf, PA-C       dexamethasone (DECADRON) tablet 8 mg  8 mg Oral Daily Dellinger, Marianne L, PA-C   8 mg at 07/01/18 0848   docusate sodium (COLACE) capsule 100 mg  100 mg Oral BID Lanae Crumbly, PA-C   100 mg at 07/01/18 0852   enoxaparin (LOVENOX) injection 100 mg  1 mg/kg Subcutaneous Q12H Corinda Gubler, RPH   100 mg at 07/01/18 3578   feeding supplement (ENSURE ENLIVE) (ENSURE ENLIVE) liquid 237 mL  237 mL Oral BID BM Dellinger, Marianne L, PA-C   237 mL at 07/01/18 1408   feeding supplement (PRO-STAT SUGAR FREE 64) liquid 30 mL  30 mL Oral BID WC [name], MD   30 mL at 07/01/18 1200   fluconazole (DIFLUCAN) tablet 100 mg  100 mg Oral Daily Volanda Napoleon, MD   100 mg at 07/01/18 0847   insulin aspart (novoLOG) injection 0-15 Units  0-15 Units Subcutaneous TID WC Lanae Crumbly, PA-C   2 Units at 07/01/18 1212   insulin glargine (LANTUS) injection 10 Units  10 Units Subcutaneous QHS [name], MD   10 Units at 06/30/18 2124   lactulose (CHRONULAC) 10 GM/15ML solution 20 g  20 g Oral BID Volanda Napoleon, MD   20 g at 07/01/18 1200   menthol-cetylpyridinium (CEPACOL) lozenge 3 mg  1 lozenge Oral PRN [name], MD       metoprolol succinate (TOPROL-XL) 24 hr tablet 50 mg  50 mg Oral Daily Regalado, Belkys A, MD   50 mg at 07/01/18 0846   multivitamin with minerals tablet 1 tablet  1 tablet Oral Daily [name], MD   1 tablet at 07/01/18 0851   ondansetron (ZOFRAN) tablet 4 mg  4 mg Oral Q6H PRN Lanae Crumbly, PA-C       Or   ondansetron [facility]) injection 4 mg  4 mg Intravenous Q6H PRN Benjiman Core M, PA-C         oxyCODONE (Oxy IR/ROXICODONE) immediate release tablet 10 mg  10 mg Oral Q4H PRN [name], MD       Or   oxyCODONE (Oxy IR/ROXICODONE) immediate release tablet 15 mg  15 mg Oral Q4H PRN [name], MD   15 mg at 07/01/18 0428   oxyCODONE (OXYCONTIN) 12 hr tablet 15 mg  15 mg Oral Q12H Dellinger, Marianne L, PA-C   15 mg at 07/01/18 0850   pantoprazole (PROTONIX) EC tablet 40 mg  40 mg Oral BID Volanda Napoleon, MD   40 mg at 07/01/18 0850   polyethylene glycol (MIRALAX / GLYCOLAX) packet 17 g  17 g Oral BID Regalado, Belkys A, MD       senna (SENOKOT) tablet 17.2 mg  2 tablet Oral QHS Dellinger, Marianne L, PA-C   17.2 mg at 06/30/18 2123   sodium bicarbonate tablet 1,300 mg  1,300 mg Oral BID Regalado, Belkys A, MD   1,300 mg at 07/01/18 1038   sodium chloride flush (NS) 0.9 % injection 10-40 mL  10-40 mL Intracatheter Q12H [name], MD   10 mL at 06/30/18 0908   sodium chloride flush (NS) 0.9 % injection 10-40 mL  10-40 mL Intracatheter PRN [name], MD   10 mL at 06/29/18 2010   tamsulosin (FLOMAX) capsule 0.4 mg  0.4 mg Oral Daily [name], MD   0.4 mg at 07/01/18 [phone]  REVIEW OF SYSTEMS: On review of systems, the patient reports that he is having tremendous pain in the entire pelvis but more into the right.  He states that movement amplifies his symptoms particularly being moved from one area of the hospital to another and feels like when he is going down the hallway he is on railroad ties.  He reports weakness in his lower extremities as a result of his pain.  He has had poor appetite, and unintended weight change in the recent past as a result of poor appetite.  He has had increasing difficulty with emptying his bladder and is currently being decompressed with a Foley catheter.  A complete review of systems is obtained and is otherwise negative.     PHYSICAL EXAM:  Wt Readings from Last 3 Encounters:  06/21/18 223 lb 4.8 oz (101.3 kg)  06/04/18 235  lb (106.6 kg)  05/24/18 224 lb (101.6 kg)   Temp Readings from Last 3 Encounters:  06/29/18 98.2 F (36.8 C) (Oral)  07/01/18 97.7 F (36.5 C) (Oral)  05/11/18 98.4 F (36.9 C) (Oral)   BP Readings from Last 3 Encounters:  06/29/18 134/80  07/01/18 (!) 150/88  06/04/18 (!) 152/82   Pulse Readings from Last 3 Encounters:  06/29/18 88  07/01/18 75  06/04/18 79   Pain Assessment Pain Score: 6  Pain Frequency: Constant/10 Unable to assess during current encounter.  ECOG = 4  0 - Asymptomatic (Fully active, able to carry on all predisease activities without restriction)  1 - Symptomatic but completely ambulatory (Restricted in physically strenuous activity but ambulatory and able to carry out work of a light or sedentary nature. For example, light housework, office work)  2 - Symptomatic, <50% in bed during the day (Ambulatory and capable of all self care but unable to carry out any work activities. Up and about more than 50% of waking hours)  3 - Symptomatic, >50% in bed, but not bedbound (Capable of only limited self-care, confined to bed or chair 50% or more of waking hours)  4 - Bedbound (Completely disabled. Cannot carry on any self-care. Totally confined to bed or chair)  5 - Death   Eustace Pen MM, Creech RH, Tormey DC, et al. [phone]). "Toxicity and response criteria of the [facility] Group". [place] Oncol. 5 (6): 649-55    LABORATORY DATA:  Lab Results  Component Value Date   WBC 7.9 07/01/2018   HGB 11.0 (L) 07/01/2018   HCT 33.2 (L) 07/01/2018   MCV 98.8 07/01/2018   PLT 240 07/01/2018   Lab Results  Component Value Date   NA 135 07/01/2018   K 5.1 07/01/2018   CL 103 07/01/2018   CO2 19 (L) 07/01/2018   Lab Results  Component Value Date   ALT 68 (H) 07/01/2018   AST 59 (H) 07/01/2018   ALKPHOS 443 (H) 07/01/2018   BILITOT 1.0 07/01/2018      RADIOGRAPHY: Dg Pelvis 1-2 Views  Result Date: 06/26/2018 CLINICAL DATA:  Pelvic  fracture. EXAM: PELVIS - 1-2 VIEW COMPARISON:  MRI pelvis 06/25/2018. Nuclear medicine bone scan 06/25/2018. CT abdomen and pelvis 06/24/2018. FINDINGS: There is heterogeneously increased density in the right ischium with superimposed curvilinear lucency corresponding to the pathologic fracture shown on the prior studies without significant displacement evident on these AP radiographs. The femoral heads are approximated with the acetabula on these AP radiographs. A remote, healed left femur fracture is again noted with partial visualization of an intramedullary nail and  hip screw. IMPRESSION: Known pathologic fracture of the right ischium. Electronically Signed   By: Logan Bores M.D.   On: 06/26/2018 20:46   Ct Chest W Contrast  Result Date: 06/26/2018 CLINICAL DATA:  Osseous lesions involving the L3 vertebral body, right acetabulum and ischium suspicious for metastatic disease. Ill-defined right hepatic lesion. Assess for primary lung cancer. EXAM: CT CHEST WITH CONTRAST TECHNIQUE: Multidetector CT imaging of the chest was performed during intravenous contrast administration. CONTRAST:  15m OMNIPAQUE IOHEXOL 300 MG/ML  SOLN COMPARISON:  Abdominopelvic CT 06/24/2018, abdominal MRI 06/25/2018, cardiac CT 12/17/2008 and bone scan 06/25/2018. FINDINGS: Cardiovascular: Contrast bolus is suboptimal. There is atherosclerosis of the aorta, great vessels and coronary arteries status post median sternotomy and CABG. Right arm PICC extends to the superior cavoatrial junction. There is stable mild cardiomegaly. No significant pericardial effusion. Mediastinum/Nodes: There are no enlarged mediastinal, hilar or axillary lymph nodes.Moderate to large hiatal hernia again noted. The trachea and thyroid gland appear unremarkable. Lungs/Pleura: There is no pleural effusion or pneumothorax. There is mild chronic lung disease with central airway thickening and subpleural reticulation. There are multiple noncalcified pulmonary  nodules which appear new compared with the remote cardiac CT (the lungs are incompletely visualized at that time). The largest nodule is located posteriorly in the right upper lobe along the superior aspect of the major fissure, measuring 18 x 13 mm on image 81/4. Other nodules include a 12 x 7 mm right upper lobe nodule (image 66/4), an 8 x 7 mm right lower lobe nodule (image 120/4) and a 9 mm left upper lobe nodule (image 41/4). There is a calcified right upper lobe granuloma. Upper abdomen: As seen on recent CT and MRI, there is a peripherally enhancing mass centrally in the right hepatic lobe measuring up to 5.4 x 4.4 cm on image 138/3. Adjacent satellite lesions, IVC extension of tumor and intrahepatic biliary dilatation posteriorly in the right hepatic lobe are again noted. Musculoskeletal/Chest wall: There is no chest wall mass or suspicious osseous finding. Previous median sternotomy and lower cervical fusion. There are loose bodies within the right shoulder joint. IMPRESSION: 1. There are multiple pulmonary nodules bilaterally, including a dominant irregular right upper lobe lesion which could reflect primary lung cancer. The other nodules appear new from remote cardiac CT, suspicious for metastatic disease. No thoracic adenopathy. 2. Moderate to large hiatal hernia. 3. Central mass in the right hepatic lobe with associated biliary dilatation again noted as seen on recent abdominal studies. 4. Based on the patient's prior studies, it remains uncertain as to what the primary malignancy is in this case. 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 Electronically Signed   By: WRichardean SaleM.D.   On: 06/26/2018 09:18   Mr Lumbar Spine W/o Contrast  Addendum Date: 06/21/2018   ADDENDUM REPORT: 06/21/2018 13:25 ADDENDUM: The new abnormal signal in the posterior L3 vertebral body could also represent a  small infarct. Correlation with clinical symptoms and inflammatory markers is still recommended to exclude osteomyelitis. Electronically Signed   By: WTitus DubinM.D.   On: 06/21/2018 13:25   Result Date: 06/21/2018 CLINICAL DATA:  Low back pain radiating to the right buttock and leg to the knee for the past 3 months. Bilateral leg numbness and weakness. Recent surgery in February. EXAM: MRI LUMBAR SPINE WITHOUT CONTRAST TECHNIQUE: Multiplanar, multisequence MR imaging of the lumbar spine was performed. No intravenous contrast was administered. COMPARISON:  MRI lumbar spine dated March 31, 2018. FINDINGS: Segmentation:  Unchanged partial sacralization of L5 on the left. Alignment:  Unchanged 4 mm anterolisthesis at L4-L5. Vertebrae: No fracture. New focal, rounded 2.3 cm area of slightly T2 hypointense, T1 hypointense, slightly stir hyperintense marrow signal in the posterior L3 vertebral body with a curvilinear rim of T2/STIR hyperintensity. Conus medullaris and cauda equina: Conus extends to the T12-L1 level. Conus and cauda equina appear normal. Paraspinal and other soft tissues: Paraspinous postsurgical changes centered at L3-L4 with loculated 2.8 x 1.4 x 7.9 cm fluid collection. No epidural fluid collection. Disc levels: T12-L1: Unchanged mild disc bulging and mild bilateral facet arthropathy. Unchanged mild right neuroforaminal stenosis. No spinal canal or left neuroforaminal stenosis. L1-L2: Unchanged small shallow broad-based posterior disc protrusion and mild bilateral facet arthropathy with ligamentum flavum hypertrophy resulting and moderate spinal canal and mild bilateral neuroforaminal stenosis. L2-L3: Prior posterior decompression. Unchanged small shallow broad-based posterior disc protrusion and mild bilateral facet arthropathy. Unchanged moderate bilateral lateral recess and neuroforaminal stenosis. No spinal canal stenosis. L3-L4: Prior posterior decompression with interval bilateral partial  facetectomy. Unchanged small broad-based posterior disc protrusion and severe bilateral facet arthropathy. Resolved spinal canal stenosis. Residual mild bilateral lateral recess stenosis and moderate bilateral neuroforaminal stenosis. L4-L5: Unchanged mild disc uncovering and disc bulging with prior bilateral facet joint ankylosis. Unchanged mild bilateral lateral recess and neuroforaminal stenosis. No spinal canal stenosis. L5-S1: Negative disc. Unchanged moderate right and mild left facet arthropathy. No stenosis. IMPRESSION: 1. New focal, rounded 2.3 cm area of abnormal signal in the posterior L3 vertebral body, most concerning for osteomyelitis given recent surgery. The appearance could be seen with a metastatic lesion such as prostate cancer, although this is thought to be less likely given the normal marrow signal 3 months ago. 2. Interval repeat posterior decompression with new bilateral partial facetectomies at L3-L4. Resolved spinal canal stenosis. There is residual mild bilateral lateral recess stenosis and moderate bilateral neuroforaminal stenosis. 3. Unchanged moderate spinal canal stenosis at L1-L2. 4. Prior remote posterior decompression at L2-L3 with unchanged moderate bilateral lateral recess and neuroforaminal stenosis. 5. Paraspinous postsurgical changes centered at L3-L4 with loculated 2.8 x 1.4 x 7.9 cm fluid collection, likely a seroma. These results will be called to the ordering clinician or representative by the Radiologist Assistant, and communication documented in the PACS or zVision Dashboard. Electronically Signed: By: Titus Dubin M.D. On: 06/21/2018 10:10   Mr Pelvis W Wo Contrast  Result Date: 06/25/2018 CLINICAL DATA:  Soft tissue mass around the right ischium. EXAM: MRI PELVIS WITHOUT AND WITH CONTRAST TECHNIQUE: Multiplanar multisequence MR imaging of the pelvis was performed both before and after administration of intravenous contrast. CONTRAST:  10 cc Gadavist COMPARISON:   CT scan of the abdomen and pelvis dated 06/24/2018 and lumbar MRI dated 06/20/2018 FINDINGS: Musculoskeletal: There is a 13 x 10 x 10 cm mass which appears to arise from the right ischium extending into the adjacent soft tissues. The tumor extends into the posterior aspect of the right acetabulum. There is a pathologic fracture at the right ischial tuberosity. The prior CT scan demonstrates scattered small calcifications within this mass. The mass extends into the right adductor muscles and into the gluteal muscles. The right sciatic nerve should be affected. The right internal obturator muscle is affected. The mass extends toward the rectum and prostate gland. There is a 9 mm metastatic lesion in the left sacral ala as well as a 16 mm metastatic lesion in the right  iliac crest. There is circumferential enhancement of these bone lesions similar to the appearance of the lesion in the L3 vertebral body on the MRI of the lumbar spine dated 06/20/2018. There is marked edema in the muscles of the right buttock and proximal thigh as well as edema in the retroperitoneal areas of the pelvis and in the left buttock. 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 Urinary Tract: Foley catheter in place. Small amount of air in the decompressed bladder. Bowel: Diverticulosis of the distal descending and proximal sigmoid portions of the colon. Vascular/Lymphatic: Distention of the femoral and iliac veins consistent with proximal IVC obstruction. Reproductive:  No mass or other significant abnormality Other: There is subcutaneous edema in the pelvis, most prominent on the right. IMPRESSION: 1. 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 This hip mass would be the most accessible lesion for percutaneous needle biopsy for diagnosis, if  necessary. 2. Pathologic fracture of the right ischium. 3. Metastatic lesions in the sacrum and right ilium. 4. 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 Electronically Signed   By: Lorriane Shire M.D.   On: 06/25/2018 13:54   Nm Bone Scan Whole Body  Result Date: 06/25/2018 CLINICAL DATA:  Suspected postoperative (lumbar decompression at L3-4) infection. Surgery date was 05/10/2018. 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 Operative findings showed severe stenosis and postoperatively had improvement in his leg symptoms. Postop MRI scan was obtained that showed fluid collection multiloculated and area and L3 vertebral body suggestive of osteomyelitis. MRI scan 03/31/2018 showed no abnormality of the L3 vertebral body. Additionally patient has history of coronary artery disease, diabetes type 2, depression. History of chronic interstitial cystitis. Previous left hip fracture dyslipidemia, post cervical fusion and back pain that radiates to his coccyx EXAM: NUCLEAR MEDICINE WHOLE BODY BONE SCAN TECHNIQUE: Whole body anterior and posterior images were obtained approximately 3 hours after intravenous injection of radiopharmaceutical. RADIOPHARMACEUTICALS:  22.0 mCi Technetium-48mMDP IV COMPARISON:  CT, 06/24/2018. FINDINGS: Right hip and inferior ischium uptake, most evident posteriorly, corresponding to the fractures and surrounding 12 cm soft tissue mass noted on the recent prior CT. There is focal uptake in the proximal right humeral shaft. Degenerative type uptake is noted throughout the spine and at the sternoclavicular joints. There is uptake surrounding bilateral knee prostheses, greater on the right, consistent with reactive uptake. There is uptake noted at  the right elbow, incompletely imaged. Renal uptake is symmetric. IMPRESSION: 1. Right hip and ischium uptake. This corresponds to fractures noted on the recent CT, where there was a surrounding soft tissue mass. There is also focal uptake in the proximal right humeral shaft. This additional area of uptake is concerning for metastatic disease. 2. No other evidence of metastatic disease. 3. There are other areas of degenerative and reactive uptake as detailed. Electronically Signed   By: DLajean ManesM.D.   On: 06/25/2018 14:38   Ct Abdomen Pelvis W Contrast  Result Date: 06/24/2018 CLINICAL DATA:  New L3 lesion on lumbar MRI, suspicious for metastatic disease. No known malignancy. EXAM: CT ABDOMEN AND PELVIS WITH CONTRAST TECHNIQUE: Multidetector  CT imaging of the abdomen and pelvis was performed using the standard protocol following bolus administration of intravenous contrast. CONTRAST:  162m OMNIPAQUE IOHEXOL 300 MG/ML  SOLN COMPARISON:  Abdominopelvic CT 04/30/2009.  Lumbar MRI 06/20/2018. FINDINGS: Lower chest: Trace right pleural effusion with mild bibasilar atelectasis. There is a moderate size hiatal hernia which has mildly enlarged compared with the prior CT. Aortic and coronary artery atherosclerosis noted. Hepatobiliary: There are no underlying morphologic changes of cirrhosis. There is new volume loss in the posterior segment of the right hepatic lobe with associated intrahepatic biliary dilatation. There is an ill-defined low-density mass centrally in the right hepatic lobe, measuring approximately 3.5 x 2.6 cm on image 16/3. There are possible additional lesions in the right hepatic lobe measuring 15 mm on image 16/3 and 25/3. Low-density anteriorly near the falciform ligament on image 29/3 likely represents focal fat. No significant extrahepatic biliary dilatation post cholecystectomy. Pancreas: Unremarkable. No pancreatic ductal dilatation or surrounding inflammatory changes. Spleen: Normal in  size without focal abnormality. Adrenals/Urinary Tract: Both adrenal glands appear normal. No focal renal mass or hydronephrosis identified. There is perinephric soft tissue stranding bilaterally which has mildly progressed compared with the remote CT. The bladder is decompressed by a Foley catheter. There is some air in the bladder lumen. Stomach/Bowel: No evidence of bowel wall thickening, distention or surrounding inflammatory change. Postsurgical changes in the right colon status post apparent ileocolonic anastomosis. There are sigmoid colon diverticular changes. Vascular/Lymphatic: There are no enlarged abdominal or pelvic lymph nodes. Mild aortic and branch vessel atherosclerosis. There is extrinsic mass effect on the intrahepatic portions of the IVC and right portal vein by the process in the right hepatic lobe. No large vessel occlusion identified. Reproductive: The prostate gland and seminal vesicles appear normal. Other: Soft tissue stranding throughout the perinephric and pelvic fat with a small amount of ascites. No focal fluid collection. Musculoskeletal: Status post left proximal femoral ORIF. There is a nondisplaced, mildly comminuted fracture of the right acetabulum and ischium, best seen on the reformatted images. There is a surrounding heterogeneous soft tissue mass which measures up to 11.5 x 9.7 cm on image 90/3. Some of this may reflect hematoma, although is suspicious for neoplasm. There is a component extending medially along the pelvic sidewall. The lesion involving the posterosuperior aspect of the L3 vertebral body on MRI is not well visualized. There is a probable small lytic lesion in the right inferior pubic ramus, best seen on coronal image 88/6. There are postsurgical changes in the lower lumbar spine. IMPRESSION: 1. Although the L3 lesion seen on MRI is not well seen by CT, there is an apparent partially calcified soft tissue mass associated with the right acetabulum and ischium with  an underlying pathologic fracture. Some of the soft tissue component could be due to hematoma, although the appearance is suspicious for underlying neoplasm, possibly metastatic disease or myeloma. 2. In addition, there is suspicion of an ill-defined, mass centrally in the right hepatic lobe with associated volume loss and intrahepatic biliary dilatation. There are possible additional lesions as well. The liver findings may also reflect metastatic disease or cholangiocarcinoma. No morphologic changes of cirrhosis. 3. Tissue sampling recommended, likely easiest at the ischial soft tissue mass. Electronically Signed   By: WRichardean SaleM.D.   On: 06/24/2018 15:04   Mr Liver W Wo Contrast  Result Date: 06/25/2018 CLINICAL DATA:  Right pelvic mass and liver lesions on recent CT scan. EXAM: MRI ABDOMEN WITHOUT AND WITH CONTRAST TECHNIQUE:  Multiplanar multisequence MR imaging of the abdomen was performed both before and after the administration of intravenous contrast. CONTRAST:  10 cc Gadavist COMPARISON:  CT scan 06/24/2018 FINDINGS: Lower chest: Large hiatal hernia. Hepatobiliary: Infiltrating heterogeneously enhancing mass lesion is identified in the medial right liver, mainly involving segment VII with some extension into the posterior aspect of segment VIII. The lesion measures approximately 6.2 x 5.0 x 6.0 cm and invades the intrahepatic IVC. Tumor in the IVC markedly narrows and probably obliterates the lumen of the intrahepatic segment of the IVC. Thrombus is identified in the left renal vein tracking into the infra hepatic IVC up to the level of the tumor invasion (see coronal postcontrast images 36-45 of series 26). Intrahepatic biliary duct dilatation is identified in the posterior right liver with abrupt cut off of the dilated ducts at the periphery of the mass. No evidence for left hepatic biliary dilatation. Portal vein in the posterior right hepatic lobe is attenuated, but no thrombus evident in the  main portal vein. Multiple hypervascular satellite lesions are highly suspicious for metastases. 3 of these lesions are identified in the right hepatic lobe and are well demonstrated on early postcontrast axial subtraction imaging (see images 22, 23, and 36 of series 19). Gallbladder surgically absent. Extrahepatic common duct is nondilated at 6 mm diameter. Common bile duct in the head of the pancreas is 9 dilated at 6 mm diameter. Pancreas: No focal mass lesion. No dilatation of the main duct. No intraparenchymal cyst. No peripancreatic edema. Spleen:  No splenomegaly. No focal mass lesion. Adrenals/Urinary Tract: No adrenal nodule or mass. No hydronephrosis. No suspicious enhancing lesion in either kidney. Stomach/Bowel: Large hiatal hernia. Duodenum is normally positioned as is the ligament of Treitz. No small bowel or colonic dilatation within the visualized abdomen. Vascular/Lymphatic: Left renal vein and IVC thrombus evident and described above in the hepatobiliary section of the report. There is no gastrohepatic or hepatoduodenal ligament lymphadenopathy. No intraperitoneal or retroperitoneal lymphadenopathy. Other: Extensive edema is identified around the right liver, IVC, and in the retroperitoneal space bilaterally. Musculoskeletal: Focal enhancement identified in the posterior aspect of a lumbar vertebral body, probably L3. This is in close proximity to the site of recent surgery. IMPRESSION: 1. 6 cm heterogeneously enhancing mass in the medial right liver obliterates intrahepatic bile ducts of the posterior right liver. Tumor invades the IVC, markedly narrowing and probably obliterating the lumen. IVC thrombus is identified in the infra hepatic segment extending into the left renal vein, a finding that is new since the CT scan of yesterday. Intrahepatic cholangiocarcinoma would be a distinct consideration. Hepatocellular carcinoma and, less commonly, metastatic disease can result in IVC invasion. 2.  Hypervascular satellite lesions around the dominant tumor are compatible with metastatic involvement. 3. Fairly extensive edema identified around the IVC and in the retroperitoneal space bilaterally. 4. Hypervascular lumbar spine lesion corresponds to the focus of abnormal marrow signal seen at L3 on recent dedicated lumbar spine MRI. These results will be called to the ordering clinician or representative by the Radiologist Assistant, and communication documented in the PACS or zVision Dashboard. Electronically Signed   By: Misty Stanley M.D.   On: 06/25/2018 16:11   US Renal  Result Date: 06/28/2018 CLINICAL DATA:  Acute kidney injury. EXAM: RENAL / URINARY TRACT ULTRASOUND COMPLETE COMPARISON:  Abdominal MRI 06/25/2018. CT abdomen and pelvis 06/24/2018. FINDINGS: Right Kidney: Renal measurements: 12.1 x 5.4 x 5.3 cm = volume: 180 mL . Echogenicity within normal limits. No mass or  hydronephrosis visualized. Left Kidney: Renal measurements: 13.0 x 5.5 x 5.5 cm = volume: 206 mL. Echogenicity within normal limits. No mass or hydronephrosis visualized. Bladder: Decompressed by Foley catheter. IMPRESSION: Negative renal ultrasound. Electronically Signed   By: Logan Bores M.D.   On: 06/28/2018 09:29   Ir US Guide Bx Asp/drain  Result Date: 06/21/2018 INDICATION: Indeterminate fluid collection within the midline of the low back. Please perform ultrasound-guided aspiration for diagnostic purposes. Poor venous access. Please perform PICC line placement for long-term antibiotic administration. EXAM: 1. ULTRASOUND AND FLUOROSCOPIC GUIDED PICC LINE INSERTION 2. ULTRASOUND GUIDED ASPIRATION ILL-DEFINED FLUID COLLECTION WITHIN MIDLINE OF LOW BACK COMPARISON:  Lumbar spine MRI - 06/20/2018 MEDICATIONS: None. Anesthesia/sedation: Moderate (conscious) sedation was employed during this procedure. A total of Fentanyl 50 mcg and Dilaudid 1 mg was administered intravenously. Moderate Sedation Time: 33 minutes. The patient's  level of consciousness and vital signs were monitored continuously by radiology nursing throughout the procedure under my direct supervision. CONTRAST:  None FLUOROSCOPY TIME:  36 seconds (2.3 mGy) COMPLICATIONS: None immediate. TECHNIQUE: The procedure, risks, benefits, and alternatives were explained to the patient and informed written consent was obtained. A timeout was performed prior to the initiation of the procedure. The right upper extremity was prepped with chlorhexidine in a sterile fashion, and a sterile drape was applied covering the operative field. Maximum barrier sterile technique with sterile gowns and gloves were used for the procedure. A timeout was performed prior to the initiation of the procedure. Local anesthesia was provided with 1% lidocaine. Under direct ultrasound guidance, the basilic vein was accessed with a micropuncture kit after the overlying soft tissues were anesthetized with 1% lidocaine. After the overlying soft tissues were anesthetized, a small venotomy incision was created and a micropuncture kit was utilized to access the right basilic vein. Real-time ultrasound guidance was utilized for vascular access including the acquisition of a permanent ultrasound image documenting patency of the accessed vessel. A guidewire was advanced to the level of the superior caval-atrial junction for measurement purposes and the PICC line was cut to length. A peel-away sheath was placed and a 40 cm, 5 [place], dual lumen was inserted to level of the superior caval-atrial junction. A post procedure spot fluoroscopic was obtained. The catheter easily aspirated and flushed and was sutured in place. A dressing was placed. _________________________________________________________ Attention was now paid towards aspiration of the ill-defined fluid collection within the midline of the low back demonstrated on preceding lumbar spine MRI. Sonographic evaluation of the soft tissues subjacent to the  well-healed surgical incision within the midline of the low back demonstrated an ill-defined fluid collection correlating with ill-defined fluid collection demonstrated on preceding lumbar spine MRI As such, the overlying soft tissues were prepped and draped in usual sterile fashion. After the overlying soft tissues were anesthetized with 1% lidocaine, an 18 gauge trocar needle was advanced into the dominant ill-defined component of the collection. An ultrasound image was saved for procedural documentation purposes Next, approximately 2 cc of serous, slightly blood tinged fluid was aspirated as the needle was slowly retracted. All aspirated fluid was capped and sent to the laboratory for analysis. A dressing was placed. The patient tolerated the above procedures well without immediate postprocedural complication. FINDINGS: After catheter placement, the tip lies within the superior cavoatrial junction. The catheter aspirates and flushes normally and is ready for immediate use. Technically successful ultrasound-guided aspiration of 2 cc of serous, slightly blood tinged fluid from the ill-defined serpiginous fluid collection within the  midline of the low back, subjacent to the well-healed midline surgical incision, correlating with the ill-defined fluid collection demonstrated on preceding lumbar spine MRI. IMPRESSION: 1. Successful ultrasound and fluoroscopic guided placement of a right basilic vein approach, 40 cm, 5 French, dual lumen PICC with tip at the superior caval-atrial junction. The PICC line is ready for immediate use. 2. Successful ultrasound-guided aspiration of approximately 2 cc of serous, slightly blood tinged fluid from the ill-defined fluid collection within the midline of the low back. All aspirated fluid was capped and sent to the laboratory for analysis. Note, the fluid collection is currently too small to allow for percutaneous drainage catheter placement. Electronically Signed   By: Sandi Mariscal  M.D.   On: 06/21/2018 15:30   Ct Biopsy  Result Date: 06/27/2018 CLINICAL DATA:  Liver lesion. Expansile lytic lesion of the right ischium. EXAM: CT GUIDED DEEP CORE BONE BIOPSY OF RIGHT ISCHIAL LESION ANESTHESIA/SEDATION: Intravenous Fentanyl 70mg and Versed 162mwere administered as conscious sedation during continuous monitoring of the patient's level of consciousness and physiological / cardiorespiratory status by the radiology RN, with a total moderate sedation time of 8 minutes. PROCEDURE: The procedure risks, benefits, and alternatives were explained to the patient. Questions regarding the procedure were encouraged and answered. The patient understands and consents to the procedure. patient placed left lateral decubitus. select axial scans through the pelvis were obtained. the soft tissue component of the right ischial lesion was localized and an appropriate skin entry site was determined and marked. The operative field was prepped with chlorhexidinein a sterile fashion, and a sterile drape was applied covering the operative field. A sterile gown and sterile gloves were used for the procedure. Local anesthesia was provided with 1% Lidocaine. Under CT fluoroscopic guidance, a 17 gauge trocar needle was advanced to the margin of the lesion. Once needle tip position was confirmed, coaxial 18-gauge core biopsy samples were obtained, submitted in formalin to surgical pathology. The guide needle was removed. Postprocedure scans show no hemorrhage or other apparent complication. The patient tolerated the procedure well. COMPLICATIONS: None immediate FINDINGS: Soft tissue mass about the right ischium was localized. Representative core biopsy samples obtained as above. IMPRESSION: 1. Technically successful CT-guided deep core bone biopsy, right ischial lesion. Electronically Signed   By: D Lucrezia Europe.D.   On: 06/27/2018 12:58   Portable Chest 1 View  Result Date: 06/21/2018 CLINICAL DATA:  Preoperative study.   Recent PICC line placement. EXAM: PORTABLE CHEST 1 VIEW COMPARISON:  Chest x-ray dated May 03, 2018. FINDINGS: Interval placement of a right upper extremity PICC line with the tip at the cavoatrial junction. The heart size and mediastinal contours are within normal limits. Prior CABG. Atherosclerotic calcification of the aortic arch. Normal pulmonary vascularity. No focal consolidation, pleural effusion, or pneumothorax. Unchanged calcified granuloma in the right upper lobe. No acute osseous abnormality. Unchanged moderate to large hiatal hernia. IMPRESSION: No active disease. Electronically Signed   By: WiTitus Dubin.D.   On: 06/21/2018 16:58   Vas [place] Extremity Venous (dvt)  Result Date: 06/27/2018  Lower Venous Study Indications: Pelvic mass pressing on veins.  Risk Factors: Cancer patient. Performing Technologist: JiJune LeapDMS, RVT  Examination Guidelines: A complete evaluation includes B-mode imaging, spectral Doppler, color Doppler, and power Doppler as needed of all accessible portions of each vessel. Bilateral testing is considered an integral part of a complete examination. Limited examinations for reoccurring indications may be performed as noted.  Right Venous Findings: +---------+---------------+---------+-----------+----------+-------+  Compressibility Phasicity Spontaneity Properties Summary  +---------+---------------+---------+-----------+----------+-------+  CFV       Full            Yes       Yes                             +---------+---------------+---------+-----------+----------+-------+  SFJ       Full                                                      +---------+---------------+---------+-----------+----------+-------+  FV Prox   Full                                                      +---------+---------------+---------+-----------+----------+-------+  FV Mid    Full                                                       +---------+---------------+---------+-----------+----------+-------+  FV Distal Full                                                      +---------+---------------+---------+-----------+----------+-------+  PFV       Full                                                      +---------+---------------+---------+-----------+----------+-------+  POP       Full            Yes       Yes                             +---------+---------------+---------+-----------+----------+-------+  PTV       Full                                                      +---------+---------------+---------+-----------+----------+-------+  PERO      Full                                                      +---------+---------------+---------+-----------+----------+-------+  Soleal    Partial                                                   +---------+---------------+---------+-----------+----------+-------+  Left Venous Findings: +---------+---------------+---------+-----------+----------+-------+            Compressibility Phasicity Spontaneity Properties Summary  +---------+---------------+---------+-----------+----------+-------+  CFV       Full            Yes       Yes                             +---------+---------------+---------+-----------+----------+-------+  SFJ       Full                                                      +---------+---------------+---------+-----------+----------+-------+  FV Prox   Full                                                      +---------+---------------+---------+-----------+----------+-------+  FV Mid    Full                                                      +---------+---------------+---------+-----------+----------+-------+  FV Distal Full                                                      +---------+---------------+---------+-----------+----------+-------+  PFV       Full                                                       +---------+---------------+---------+-----------+----------+-------+  POP       Full            Yes       Yes                             +---------+---------------+---------+-----------+----------+-------+  PTV       Full                                                      +---------+---------------+---------+-----------+----------+-------+  PERO      Full                                                      +---------+---------------+---------+-----------+----------+-------+    Summary: Right: Findings consistent with acute deep vein thrombosis involving the right soleal vein. No cystic structure found in the popliteal fossa. Left: There is no evidence of deep vein  thrombosis in the lower extremity. No cystic structure found in the popliteal fossa.  *See table(s) above for measurements and observations. Electronically signed by Monica Martinez MD on 06/27/2018 at 6:20:31 PM.    Final    Ir Picc Placement Right >5 Yrs Inc Img Guide  Result Date: 06/21/2018 INDICATION: Indeterminate fluid collection within the midline of the low back. Please perform ultrasound-guided aspiration for diagnostic purposes. Poor venous access. Please perform PICC line placement for long-term antibiotic administration. EXAM: 1. ULTRASOUND AND FLUOROSCOPIC GUIDED PICC LINE INSERTION 2. ULTRASOUND GUIDED ASPIRATION ILL-DEFINED FLUID COLLECTION WITHIN MIDLINE OF LOW BACK COMPARISON:  Lumbar spine MRI - 06/20/2018 MEDICATIONS: None. Anesthesia/sedation: Moderate (conscious) sedation was employed during this procedure. A total of Fentanyl 50 mcg and Dilaudid 1 mg was administered intravenously. Moderate Sedation Time: 33 minutes. The patient's level of consciousness and vital signs were monitored continuously by radiology nursing throughout the procedure under my direct supervision. CONTRAST:  None FLUOROSCOPY TIME:  36 seconds (2.3 mGy) COMPLICATIONS: None immediate. TECHNIQUE: The procedure, risks, benefits, and alternatives were  explained to the patient and informed written consent was obtained. A timeout was performed prior to the initiation of the procedure. The right upper extremity was prepped with chlorhexidine in a sterile fashion, and a sterile drape was applied covering the operative field. Maximum barrier sterile technique with sterile gowns and gloves were used for the procedure. A timeout was performed prior to the initiation of the procedure. Local anesthesia was provided with 1% lidocaine. Under direct ultrasound guidance, the basilic vein was accessed with a micropuncture kit after the overlying soft tissues were anesthetized with 1% lidocaine. After the overlying soft tissues were anesthetized, a small venotomy incision was created and a micropuncture kit was utilized to access the right basilic vein. Real-time ultrasound guidance was utilized for vascular access including the acquisition of a permanent ultrasound image documenting patency of the accessed vessel. A guidewire was advanced to the level of the superior caval-atrial junction for measurement purposes and the PICC line was cut to length. A peel-away sheath was placed and a 40 cm, 5 [place], dual lumen was inserted to level of the superior caval-atrial junction. A post procedure spot fluoroscopic was obtained. The catheter easily aspirated and flushed and was sutured in place. A dressing was placed. _________________________________________________________ Attention was now paid towards aspiration of the ill-defined fluid collection within the midline of the low back demonstrated on preceding lumbar spine MRI. Sonographic evaluation of the soft tissues subjacent to the well-healed surgical incision within the midline of the low back demonstrated an ill-defined fluid collection correlating with ill-defined fluid collection demonstrated on preceding lumbar spine MRI As such, the overlying soft tissues were prepped and draped in usual sterile fashion. After the  overlying soft tissues were anesthetized with 1% lidocaine, an 18 gauge trocar needle was advanced into the dominant ill-defined component of the collection. An ultrasound image was saved for procedural documentation purposes Next, approximately 2 cc of serous, slightly blood tinged fluid was aspirated as the needle was slowly retracted. All aspirated fluid was capped and sent to the laboratory for analysis. A dressing was placed. The patient tolerated the above procedures well without immediate postprocedural complication. FINDINGS: After catheter placement, the tip lies within the superior cavoatrial junction. The catheter aspirates and flushes normally and is ready for immediate use. Technically successful ultrasound-guided aspiration of 2 cc of serous, slightly blood tinged fluid from the ill-defined serpiginous fluid collection within the midline of the low back, subjacent to  the well-healed midline surgical incision, correlating with the ill-defined fluid collection demonstrated on preceding lumbar spine MRI. IMPRESSION: 1. Successful ultrasound and fluoroscopic guided placement of a right basilic vein approach, 40 cm, 5 [place], dual lumen PICC with tip at the superior caval-atrial junction. The PICC line is ready for immediate use. 2. Successful ultrasound-guided aspiration of approximately 2 cc of serous, slightly blood tinged fluid from the ill-defined fluid collection within the midline of the low back. All aspirated fluid was capped and sent to the laboratory for analysis. Note, the fluid collection is currently too small to allow for percutaneous drainage catheter placement. Electronically Signed   By: Sandi Mariscal M.D.   On: 06/21/2018 15:30       IMPRESSION/PLAN: 1. 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 Dr. Sondra Come is aware of the patient's case and has reviewed imaging.  At this time it appears that the patient has metastatic disease to the right acetabulum and right ischium causing significant  dysfunction of the urinary system, severe pain, and his disease has resulted in fracture.  I discussed with the patient the findings, the rationale to consider palliative radiotherapy, the delivery and logistics of treatment as well as the risks, benefits, short and long-term effects of treatment, and he is interested in moving forward.  He understands of these treatments would be palliative in nature, and that further directed therapy would be dependent on the type of malignancy identified.  He will be followed by the oncology service, and additional recommendations for systemic therapy will be outlined when this information is available.  Logistically given the current covered pandemic, the patient is concerned about transferring to [place] long as that has been the designated site for coag care, however we also discussed the risks of being taken via [facility] transportation as critically ill patients travel in that same form of transportation from hospitals and in the community.  I will reach out to Dr. Lorin Mercy to find out what his preference is for [place] to move forward.  I discussed with the patient that we would like to offer him simulation tomorrow afternoon if possible.  He is in agreement again as long as Dr. Lorin Mercy is in agreement with this plan.  We discussed that we would review consent, I will also spent time this afternoon calling his daughter and explaining the same discussion to her.  At the end of the conversation, the patient gives verbal consent to move forward and we will sign paper documentation of this at the time of his simulation.  Dr. Randa Ngo anticipates and recommends a course of 10 fractions to the right hip and pelvis.  We anticipate that this would significantly reduce his pain, and he could anticipate this in the next couple of weeks.   In a visit lasting 70 minutes, greater than 50% of the time was spent by phone discussing his case, and in floor time coordinating the patient's  care.    Carola Rhine, PAC

## 2018-07-01 NOTE — Care Management (Deleted)
    Durable Medical Equipment  (From admission, onward)         Start     Ordered   07/01/18 1542  For home use only DME standard manual wheelchair with seat cushion  Once    Comments:  Patient suffers from right pelvic fracture  Malignancy which impairs their ability to perform daily activities like ambulating  in the home.  A cane  will not resolve  issue with performing activities of daily living. A wheelchair will allow patient to safely perform daily activities. Patient can safely propel the wheelchair in the home or has a caregiver who can provide assistance.  Accessories: elevating leg rests (ELRs), wheel locks, extensions and anti-tippers.   07/01/18 1544   07/01/18 1541  For home use only DME 3 n 1  Once     07/01/18 1544   07/01/18 1541  For home use only DME Hospital bed  Once    Comments:  Daughter Skylur Fuston [phone]  Question Answer Comment  Patient has (list medical condition): right pelvic fracture , malignancy   The above medical condition requires: Patient requires the ability to reposition frequently   Head must be elevated greater than: 45 degrees   Bed type Semi-electric      07/01/18 1544

## 2018-07-01 NOTE — Progress Notes (Signed)
The chaplain attempted F/U spiritual care as requested by the Pt.  The Pt. was sleeping at the time of the chaplain's visit.  The chaplain will F/U with spiritual care as needed.

## 2018-07-01 NOTE — Progress Notes (Signed)
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  His right leg does not look as swollen.  He says he has some weakness and stiffness in the right leg.  I am sure that this is from the tumor in his right pelvis.  I will call radiation oncology today to see if they can see him and maybe start some radiation therapy to this tumor.  He is on Lovenox.  He is doing okay with the Lovenox.  There is no bleeding.  His appetite might be a little bit better.  He has had no nausea or vomiting.  He is little bit constipated.  I think maybe some lactulose would not be a bad idea for him.  There is been no cough.  He has had no obvious temperature.  His labs look pretty good.  His white cell count is 7.9.  Hemoglobin 11 platelet count 240,000.  His sodium is 135.  Potassium 5.1.  BUN is 68 creatinine is 1.45.  I suspect that some of the BUN elevation might be from his steroids that he is on.  He does have some slightly elevated liver function studies which is no surprise.  His bilirubin is 1.0.  We are still awaiting the pathology report.  There really is no change on his exam for right now.  I did speak to his daughter by phone on Friday.  I gave her an update as what was going on.  Apparently, his son is coming down from [place] to help out.  I think this will really be a wonderful situation for him.  Hopefully, we will have the pathology report, at least a preliminary report, today.  Lattie Haw, MD  Elta [place] 16:5-7

## 2018-07-01 NOTE — Progress Notes (Signed)
Scrotal sling in place, assessed 2x at 4 hours interval, no increased redness, pain or swelling, no skin breakdown noted, will continue to monitor.

## 2018-07-02 ENCOUNTER — Telehealth: Payer: Self-pay | Admitting: Radiation Oncology

## 2018-07-02 ENCOUNTER — Ambulatory Visit: Payer: Medicare Other | Admitting: Radiation Oncology

## 2018-07-02 LAB — CBC
HCT: 33.5 % — ABNORMAL LOW (ref 39.0–52.0)
Hemoglobin: 11 g/dL — ABNORMAL LOW (ref 13.0–17.0)
MCH: 32.4 pg (ref 26.0–34.0)
MCHC: 32.8 g/dL (ref 30.0–36.0)
MCV: 98.8 fL (ref 80.0–100.0)
Platelets: 254 10*3/uL (ref 150–400)
RBC: 3.39 MIL/uL — ABNORMAL LOW (ref 4.22–5.81)
RDW: 15.5 % (ref 11.5–15.5)
WBC: 9.3 10*3/uL (ref 4.0–10.5)
nRBC: 0 % (ref 0.0–0.2)

## 2018-07-02 LAB — GLUCOSE, CAPILLARY
Glucose-Capillary: 97 mg/dL (ref 70–99)
Glucose-Capillary: 128 mg/dL — ABNORMAL HIGH (ref 70–99)
Glucose-Capillary: 146 mg/dL — ABNORMAL HIGH (ref 70–99)
Glucose-Capillary: 148 mg/dL — ABNORMAL HIGH (ref 70–99)

## 2018-07-02 LAB — LACTATE DEHYDROGENASE: LDH: 232 U/L — ABNORMAL HIGH (ref 98–192)

## 2018-07-02 LAB — BASIC METABOLIC PANEL
Anion gap: 10 (ref 5–15)
BUN: 54 mg/dL — ABNORMAL HIGH (ref 8–23)
CO2: 21 mmol/L — ABNORMAL LOW (ref 22–32)
Calcium: 7.9 mg/dL — ABNORMAL LOW (ref 8.9–10.3)
Chloride: 105 mmol/L (ref 98–111)
Creatinine, Ser: 1.33 mg/dL — ABNORMAL HIGH (ref 0.61–1.24)
GFR calc Af Amer: 57 mL/min — ABNORMAL LOW (ref >60)
GFR calc non Af Amer: 49 mL/min — ABNORMAL LOW (ref >60)
Glucose, Bld: 131 mg/dL — ABNORMAL HIGH (ref 70–99)
Potassium: 4.7 mmol/L (ref 3.5–5.1)
Sodium: 136 mmol/L (ref 135–145)

## 2018-07-02 LAB — MAGNESIUM: Magnesium: 2.6 mg/dL — ABNORMAL HIGH (ref 1.7–2.4)

## 2018-07-02 MED ORDER — LORAZEPAM 2 MG/ML IJ SOLN
0.2500 mg | Freq: Four times a day (QID) | INTRAMUSCULAR | Status: DC | PRN
  Administered 2018-07-04 – 2018-07-09 (×3): 0.25 mg via INTRAVENOUS
  Filled 2018-07-02 (×3): qty 1

## 2018-07-02 MED ORDER — LORAZEPAM 0.5 MG PO TABS: 0.2500 mg | ORAL_TABLET | Freq: Every day | ORAL | Status: DC

## 2018-07-02 MED ORDER — PANTOPRAZOLE SODIUM 40 MG PO TBEC: 40.0000 mg | DELAYED_RELEASE_TABLET | Freq: Two times a day (BID) | ORAL | Status: DC

## 2018-07-02 MED ORDER — FENTANYL 12 MCG/HR TD PT72
1.0000 | MEDICATED_PATCH | TRANSDERMAL | Status: DC
  Administered 2018-07-02: 1 via TRANSDERMAL
  Filled 2018-07-02: qty 1

## 2018-07-02 MED ORDER — HYDROMORPHONE HCL 2 MG PO TABS
4.0000 mg | ORAL_TABLET | ORAL | Status: DC | PRN
  Administered 2018-07-02 – 2018-07-03 (×4): 4 mg via ORAL
  Filled 2018-07-02 (×5): qty 2

## 2018-07-02 MED ORDER — FENTANYL 25 MCG/HR TD PT72
1.0000 | MEDICATED_PATCH | TRANSDERMAL | Status: DC
  Administered 2018-07-02: 1 via TRANSDERMAL
  Filled 2018-07-02: qty 1

## 2018-07-02 MED ORDER — HEPARIN SOD (PORK) LOCK FLUSH 100 UNIT/ML IV SOLN
250.0000 [IU] | INTRAVENOUS | Status: AC | PRN
  Administered 2018-07-02: 250 [IU]

## 2018-07-02 NOTE — Progress Notes (Signed)
   07/02/18 1100  Clinical Encounter Type  Visited With Patient  Visit Type Initial;Social support;Spiritual support  Spiritual Encounters  Spiritual Needs Emotional  Stress Factors  Patient Stress Factors Exhausted;Health changes;Loss of control   Met w/ pt after OT and PT finished.  Pt said it wasn't a good time b/c he was going to [facility] then proceeded to share for about 20 mins.  As Tonia Ghent noted, pt spoke about his following of Jamal Maes and spoke about his attraction to [employer].  Pt was a Glass blower/designer at [employer], [employer], and [employer].  Articulated appreciation for visit.  Myra Gianotti resident, [phone]

## 2018-07-02 NOTE — Progress Notes (Signed)
PROGRESS NOTE    Lucas Morales  HCW:[id number] DOB: 1934-03-12 DOA: 06/21/2018 PCP: Deland Pretty, MD    Brief Narrative:  Lucas Morales is a 85 y.o. male with medical history significant of adenomatous polyps, alpha-1 antitrypsin deficiency carrier, anemia, anxiety, depression, osteoarthritis of the knees and back, easy bruising, chronic bronchitis, chronic lower back pain, CAD, history of frequent diarrhea due to partial colectomy, diverticulosis, episodes of diverticulitis, hemorrhoids, gastritis, hiatal hernia, GERD, esophageal stricture, esophageal dysmotility, history of post CABG paroxysmal A. fib, history of bradycardia, hyperlipidemia, hypertension, OSA not on CPAP due to intolerance, restless leg syndrome, sciatic nerve pain, type 2 diabetes, urinary frequency who had lumbar decompression L3-4 on 05/10/2018 and was admitted about a week ago due to suspected L3 vertebral body osteomyelitis.  However, after reviewing all imaging and getting new imaging studies, it was determined that the patient had a metastatic lesion.  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  He was seen by oncology 3 days ago.  He underwent biopsy of the area with interventional radiology.  We are seeing him consult due to AKI, worsening LFTs and multiple medical issues.   Developed AKI: Suspect multifactorial secondary to contrast and dehydration. Pathology report pending. Patient will be transfer to [facility] long to start radiation treatment to pelvis mass.   Assessment & Plan:   Principal Problem:   AKI (acute kidney injury) ([place]) Active Problems:   Essential hypertension   Dyslipidemia   PAF (paroxysmal atrial fibrillation) (H[facility])   OSA (obstructive sleep apnea)   Deep venous thrombosis (H[facility])   Cancer associated pain   DNR (do not resuscitate)   Type II diabetes mellitus ([place])   Coronary artery disease   Pelvic mass in male   Metastatic cancer ([place])  1-AKI: Likely due to decreased oral intake and  contrast exposure for CT abdomen and pelvis.  He also had some problem with urine retention. Continue with Foley catheter. Cr trending down today at 17----1.9/  Strict I's and O's. Renal ultrasound normal. Cr has decreased to 1.3. IV fluid rate decrease to 45 cc/Hr. Improved.   Pelvic mass, multiple liver masses; Awaiting pathology report. Dr. Marin Olp following. On Decadron to help with pain. Patient with significant pain, scrotal swelling. Pain is limit factor to allow functionality. Patient needs to remain in hospital for radiation treatment.  Will ask palliative to follow on patient for continue pain management.   L3 lesion: Likely metastatic disease and not consistent with infection.  Hypertension: Resume metoprolol.  HLD; holding due to transaminases.  Paroxysmal A. Fib: continue with metoprolol.  OSA: Not on CPAP.  Continue with oxygen supplementation.  Acute DVT, right soleal vein: Now on Lovenox continue at discharge. Doppler; Findings consistent with acute deep vein thrombosis involving the right soleal vein. Continue with lovenox.   Cancer associated pain: On oxycodone and OxyContin. Type 2 diabetes: Sliding scale insulin.  Monitor on Decadron.  Coronary artery disease: Resume beta-blocker.  Transaminases: Suspect related to liver masses , stable.   Hyponatremia: Improved with IV fluids.  Hyperkalemia;  Repeat dose of lokelma.  Change diet to renal diet.   Metabolic acidosis;  Started sodium bicarb.  Improved.  Nutrition Problem: Increased nutrient needs Etiology: post-op healing    Signs/Symptoms: estimated needs    Interventions: MVI, Prostat, Magic cup, Boost Plus  Estimated body mass index is 28.67 kg/m as calculated from the following:   Height as of this encounter: 6\' 2"  (1.88 m).   Weight as of this encounter: 101.3  kg.   DVT prophylaxis: Lovenox Code Status: DNR Family Communication: Care discussed with patient. Multiples consultant  have discussed care with family member,. [facility] will take over patient care starting today.  Disposition Plan; Transfer to [facility] long for radiation treatment.   Subjective: He is in a lot of pain. Scrotal swelling. He agrees to go to [place] long if Dr Lorin Mercy agree with that plan.   Objective: Vitals:   06/27/18 1225 06/27/18 1230 06/27/18 1247 06/29/18 0435  BP: (!) 155/104 (!) 172/92 (!) 154/83 134/80  Pulse: 95 96 100 88  Resp: 18 18 18 18   Temp:   98.4 F (36.9 C) 98.2 F (36.8 C)  TempSrc:   Oral Oral  SpO2: 98% 98% 93% 92%  Weight:      Height:        Intake/Output Summary (Last 24 hours) at 07/02/2018 0750 Last data filed at 07/02/2018 0550 Gross per 24 hour  Intake 1453.48 ml  Output 2075 ml  Net -621.52 ml   Filed Weights   06/21/18 1652  Weight: 101.3 kg    Examination:  General exam: NAD Respiratory system: CTA Cardiovascular system: S 1, S 2 IRR Gastrointestinal system: BS present, soft, nt Central nervous system: alert, conversant.  Extremities: plus 2 edema Genital scrotal swelling.  Skin: no rashes.    Data Reviewed: I have personally reviewed following labs and imaging studies  CBC: Recent Labs  Lab 06/27/18 0655 06/28/18 0444 06/29/18 0500 06/30/18 0257 07/01/18 0318 07/02/18 0420  WBC 13.7* 8.3 11.6* 6.4 7.9 9.3  NEUTROABS 11.5* 7.2 11.4* 5.4 6.4  --   HGB 12.3* 11.4* 10.7* 10.4* 11.0* 11.0*  HCT 36.3* 34.5* 32.5* 31.6* 33.2* 33.5*  MCV 94.5 95.6 99.4 98.8 98.8 98.8  PLT 300 275 246 210 240 209   Basic Metabolic Panel: Recent Labs  Lab 06/28/18 0444 06/29/18 0500 06/30/18 0257 07/01/18 0318 07/02/18 0420  NA 130* 131* 132* 135 136  K 5.0 5.2* 5.3* 5.1 4.7  CL 99 101 101 103 105  CO2 18* 17* 19* 19* 21*  GLUCOSE 175* 176* 166* 149* 131*  BUN 65* 78* 74* 68* 54*  CREATININE 1.92* 1.75* 1.59* 1.45* 1.33*  CALCIUM 8.8* 8.4* 8.2* 8.1* 7.9*   GFR: Estimated Creatinine Clearance: 54.4 mL/min (A) (by C-G formula based on SCr of  1.33 mg/dL (H)). Liver Function Tests: Recent Labs  Lab 06/27/18 0655 06/28/18 0444 06/29/18 0500 06/30/18 0257 07/01/18 0318  AST 43* 37 44* 56* 59*  ALT 45* 41 43 55* 68*  ALKPHOS 508* 472* 415* 399* 443*  BILITOT 1.2 0.9 0.6 0.5 1.0  PROT 6.2* 5.9* 5.4* 5.5* 5.8*  ALBUMIN 2.7* 2.7* 2.6* 2.6* 2.8*   No results for input(s): LIPASE, AMYLASE in the last 168 hours. No results for input(s): AMMONIA in the last 168 hours. Coagulation Profile: No results for input(s): INR, PROTIME in the last 168 hours. Cardiac Enzymes: Recent Labs  Lab 06/28/18 1025  CKTOTAL 30*   BNP (last 3 results) No results for input(s): PROBNP in the last 8760 hours. HbA1C: No results for input(s): HGBA1C in the last 72 hours. CBG: Recent Labs  Lab 06/30/18 2117 07/01/18 0732 07/01/18 1151 07/01/18 1613 07/01/18 2019  GLUCAP 134* 125* 129* 186* 160*   Lipid Profile: No results for input(s): CHOL, HDL, LDLCALC, TRIG, CHOLHDL, LDLDIRECT in the last 72 hours. Thyroid Function Tests: No results for input(s): TSH, T4TOTAL, FREET4, T3FREE, THYROIDAB in the last 72 hours. Anemia Panel: No results for input(s): VITAMINB12,  FOLATE, FERRITIN, TIBC, IRON, RETICCTPCT in the last 72 hours. Sepsis Labs: No results for input(s): PROCALCITON, LATICACIDVEN in the last 168 hours.  No results found for this or any previous visit (from the past 240 hour(s)).       Radiology Studies: No results found.      Scheduled Meds: . dexamethasone  8 mg Oral Daily  . docusate sodium  100 mg Oral BID  . enoxaparin (LOVENOX) injection  1 mg/kg Subcutaneous Q12H  . feeding supplement (ENSURE ENLIVE)  237 mL Oral BID BM  . feeding supplement (PRO-STAT SUGAR FREE 64)  30 mL Oral BID WC  . fluconazole  100 mg Oral Daily  . insulin aspart  0-15 Units Subcutaneous TID WC  . insulin glargine  10 Units Subcutaneous QHS  . lactulose  20 g Oral BID  . metoprolol succinate  50 mg Oral Daily  . multivitamin with  minerals  1 tablet Oral Daily  . oxyCODONE  15 mg Oral Q12H  . pantoprazole  40 mg Oral BID  . polyethylene glycol  17 g Oral BID  . senna  2 tablet Oral QHS  . sodium bicarbonate  1,300 mg Oral BID  . sodium chloride flush  10-40 mL Intracatheter Q12H  . tamsulosin  0.4 mg Oral Daily   Continuous Infusions: . sodium chloride 45 mL/hr at 07/01/18 2320     LOS: 11 days    Time spent: 35 minutes    Elmarie Shiley, MD [facility] Pager [phone]  If 7PM-7AM, please contact night-coverage [email or web address] Password [facility] 07/02/2018, 7:50 AM

## 2018-07-02 NOTE — Progress Notes (Addendum)
Daily Progress Note   Patient Name: Lucas Morales       Date: 08/15/4130 DOB: 1933/08/15  Age: 85 y.o. MRN#: [id number] Attending Physician: Elmarie Shiley, MD Primary Care Physician: Deland Pretty, MD Admit Date: 06/21/2018    Addendum:  Damaris Schooner with Dr. Lyndon Code regarding pathology.  Results indicate high grade carcinoma of unknown origin.  Dr. Lyndon Code has a specimen out for further analysis that will take another 7-10 days.   Reason for Consultation/Follow-up: Establishing goals of care, Pain control and Psychosocial/spiritual support  Subjective: Patient reports pain all over and a very difficult weekend.  He is worried about his children's safety.   His appetite is poor but he is forcing himself to eat.  He asks why path results take so long.  He complains of genital swelling and requests that his catheter be flushed as that helps relieve the pain.  He states that the swelling in his genitalia causes pain that radiates to his right buttock and down his right leg.  He reports that he has barely been out of bed and that it takes two people to lift him to go to the bathroom.  He has had two good bowel movements since admission.   Assessment: He appears emotionally down.  I think he has very significant pain and it is both physical and emotional.  I called pathology.  Path results are still pending with Dr. Lyndon Code.    Patient Profile/HPI:  85 y.o. male  with past medical history of CAD, Afib, alpha 1 antitrypsin deficiency, interstitial cystitis (with a tendency to have urinary retention), DM, depression and anxiety who was admitted on 06/21/2018 with what was thought to be a post op infection at L3-L4.  Work up has revealed widespread (lungs, liver, pelvis, bone) metastatic cancer of  undetermined primary.  Mr. Pitcock is going for a biopsy this morning.   PMT has been asked to consult for symptom management.   Length of Stay: 11  Current Medications: Scheduled Meds:  . dexamethasone  8 mg Oral Daily  . docusate sodium  100 mg Oral BID  . enoxaparin (LOVENOX) injection  1 mg/kg Subcutaneous Q12H  . feeding supplement (ENSURE ENLIVE)  237 mL Oral BID BM  . feeding supplement (PRO-STAT SUGAR FREE 64)  30 mL Oral BID WC  . fluconazole  100 mg Oral Daily  . insulin aspart  0-15 Units Subcutaneous TID WC  . insulin glargine  10 Units Subcutaneous QHS  . lactulose  20 g Oral BID  . metoprolol succinate  50 mg Oral Daily  . multivitamin with minerals  1 tablet Oral Daily  . oxyCODONE  15 mg Oral Q12H  . pantoprazole  40 mg Oral BID  . polyethylene glycol  17 g Oral BID  . senna  2 tablet Oral QHS  . sodium bicarbonate  1,300 mg Oral BID  . sodium chloride flush  10-40 mL Intracatheter Q12H  . tamsulosin  0.4 mg Oral Daily    Continuous Infusions: . sodium chloride 45 mL/hr at 07/01/18 2320    PRN Meds: alum & mag hydroxide-simeth, bisacodyl, clonazepam, HYDROmorphone, menthol-cetylpyridinium, ondansetron **OR** ondansetron (ZOFRAN) IV, sodium chloride flush  Physical Exam        Well developed male, awake, alert, orientated, cooperative.  Appears frustrated this morning Resp no distress Swelling noted in both is penis and scrotum.  No erythema or drainage.  Cool pack in place.  Vital Signs: BP 134/80 (BP Location: Left Arm)   Pulse 88   Temp 98.2 F (36.8 C) (Oral)   Resp 18   Ht 6\' 2"  (1.88 m)   Wt 101.3 kg   SpO2 92%   BMI 28.67 kg/m  SpO2: SpO2: 92 % O2 Device: O2 Device: Room Air O2 Flow Rate: O2 Flow Rate (L/min): 2 L/min  Intake/output summary:   Intake/Output Summary (Last 24 hours) at 07/02/2018 0910 Last data filed at 07/02/2018 0550 Gross per 24 hour  Intake 1453.48 ml  Output 2075 ml  Net -621.52 ml   LBM: Last BM Date: 07/01/18  Baseline Weight: Weight: 101.3 kg Most recent weight: Weight: 101.3 kg       Palliative Assessment/Data: 40%    Flowsheet Rows     Most Recent Value  Intake Tab  Referral Department  Oncology  Unit at Time of Referral  Med/Surg Unit  Palliative Care Primary Diagnosis  Cancer  Date Notified  06/26/18  Palliative Care Type  New Palliative care  Reason for referral  Non-pain Symptom  Date of Admission  06/21/18  # of days IP prior to Palliative referral  5  Clinical Assessment  Psychosocial & Spiritual Assessment  Palliative Care Outcomes      Patient Active Problem List   Diagnosis Date Noted  . Metastatic cancer ([place])   . Pelvic mass in male   . Type II diabetes mellitus ([place]) 06/27/2018  . Coronary artery disease 06/27/2018  . AKI (acute kidney injury) ([place]) 06/27/2018  . Cancer associated pain   . DNR (do not resuscitate)   . Palliative care encounter   . Deep venous thrombosis ([place]) 06/26/2018  . Infection of lumbar spine ([place]) 06/21/2018  . Status post lumbar spine surgery for decompression of spinal cord 05/24/2018  . Abnormal EKG 05/25/2017  . S/P cervical spinal fusion 01/25/2016  . Cervical spinal stenosis 12/20/2015  . Cervical spondylosis 12/20/2015  . Painful orthopaedic hardware left hip with chronic trochanteric bursitis 07/30/2015  . Hip bursitis 07/30/2015  . Trochanteric bursitis of left hip 07/30/2015  . Chest pain with moderate risk of acute coronary syndrome 10/19/2014  . Macular hole, left eye 09/01/2014  . Macular hole of left eye 08/28/2014  . Other pancytopenia ([place])   . Sliding hiatal hernia   . Gastric erosions   . GI bleeding 06/04/2014  . Acute GI bleeding 06/04/2014  .  Orthostatic hypotension 06/04/2014  . Acute upper gastrointestinal bleeding 06/04/2014  . Other specified diabetes mellitus without complications ([place])   . Acute blood loss anemia   . Thrombocytopenia ([place])   . Closed left hip fracture ([place]) 04/03/2014  . Fracture,  femur ([place]) 04/03/2014  . Femur fracture ([place]) 04/03/2014  . Upper airway cough syndrome 05/16/2013  . OSA (obstructive sleep apnea) 10/21/2012  . Macular hole 10/08/2012  . Preoperative clearance 09/30/2012  . PAF (paroxysmal atrial fibrillation) ([place]) 01/21/2012  . Shortness of breath on exertion 01/11/2012  . Bradycardia, sinus 01/11/2012  . Hx of CABG 01/11/2012  . Essential hypertension   . Dyslipidemia   . ABDOMINAL PAIN-LLQ 02/04/2010  . HEMORRHOIDS 08/31/2008  . ESOPHAGEAL STRICTURE 08/31/2008  . GERD 08/31/2008  . Gastritis and gastroduodenitis 08/31/2008  . HIATAL HERNIA 08/31/2008  . DIVERTICULOSIS, COLON 08/31/2008  . Chronic interstitial cystitis 08/31/2008  . COLONIC POLYPS, ADENOMATOUS, HX OF 08/31/2008    Palliative Care Plan    Recommendations/Plan:  1.  Will change oxy IR to oral dilaudid. 2.  Will convert oxycontin to fentanyl patch and d/c oxycontin tomorrow and fentanyl has had time to be absorbed. 3.  Radiation should help reduce his pain in time. 4.  Need to stay on top of bowel regimen.  Dr. Marin Olp has started lactulose in addition to existing senna and miralax.   5.  PMT will continue to follow at [facility].  He should transfer today for radiation simulation.   Goals of Care and Additional Recommendations:  Limitations on Scope of Treatment: Full Scope Treatment  Code Status:  DNR  Prognosis:   Unable to determine  Pending biopsy results and Oncology recommendations.   Discharge Planning:  Home with Home Health vs hospice.  To be determined.  Care plan was discussed with patient and Dr. Tyrell Antonio.  Thank you for allowing the Palliative Medicine Team to assist in the care of this patient.  Total time spent:  35 min.     Greater than 50%  of this time was spent counseling and coordinating care related to the above assessment and plan.  Florentina Jenny, PA-C Palliative Medicine  Please contact Palliative MedicineTeam phone at  [phone] for questions and concerns between 7 am - 7 pm.   Please see AMION for individual provider pager numbers.

## 2018-07-02 NOTE — Progress Notes (Signed)
Physical Therapy Treatment Patient Details Name: Lucas Morales MRN: [id number] DOB: 1933/11/09 Today's Date: 07/02/2018    History of Present Illness This is an 85 year old male with a past medical history including hypertension, depression and anxiety, coronary artery disease, chronic interstitial cystitis, previous left hip fracture, previous cervical fusion, hyperlipidemia, restless leg syndrome, diabetes, arthritis, alpha 1 antitrypsin deficiency carrier, anemia, chronic back pain who was admitted for suspected postoperative lumbar decompression at L3-4 infection.  Surgery date was 05/10/2018. MRI suspicious for osteomyelitis. CT scan revealed R medial acetabular fx as well as lesions on liver suspicious for cancer.     PT Comments    Pt awaiting transfer to [facility] to begin radiation treatment. Pt found in bed with scrotal sling removed. Pt educated in the purpose of the sling to aid in swelling reduction. Pt agreeable to place back on and stand to adjust, once in standing request to use BSC. Pt requires minAx2 for getting out of bed and over to [facility]. Pt requires modA for returning LE to bed. PT recommends continued acute therapy while receiving radiation to continue his progress and facilitate d/c home.    Follow Up Recommendations  SNF     Equipment Recommendations  Other (comment)(TBD)       Precautions / Restrictions Precautions Precautions: Fall Restrictions Weight Bearing Restrictions: Yes RLE Weight Bearing: Weight bearing as tolerated    Mobility  Bed Mobility Overal bed mobility: Needs Assistance Bed Mobility: Supine to Sit;Sit to Supine     Supine to sit: Min assist;+2 for physical assistance Sit to supine: Mod assist;+2 for physical assistance   General bed mobility comments: min A to come to EoB, mod assist to return LE to bed  Transfers Overall transfer level: Needs assistance Equipment used: Rolling walker (2 wheeled) Transfers: Sit to/from [name] Sit to Stand: Min assist Stand pivot transfers: Min assist       General transfer comment: minA for sit<>stand from higher bed surface, pivot to [facility] and then min physical assist from [facility].        Balance Overall balance assessment: Needs assistance   Sitting balance-Leahy Scale: Fair       Standing balance-Leahy Scale: Poor                              Cognition Arousal/Alertness: Awake/alert Behavior During Therapy: WFL for tasks assessed/performed Overall Cognitive Status: Within Functional Limits for tasks assessed                                 General Comments: Pt's son awaiting COVID testing so pt can d/c home. Pt with decreased understanding of his condition and unrealistic idea of his return to [facility]      Exercises      General Comments General comments (skin integrity, edema, etc.): OT able to readjust scrotal sling to assist in fluid return, pt educated on how it will assist despite being uncomfortable      Pertinent Vitals/Pain Pain Location: back, R hip and scrotum  Pain Descriptors / Indicators: Cramping;Jabbing;Tightness           PT Goals (current goals can now be found in the care plan section) Acute Rehab PT Goals Patient Stated Goal: to go home PT Goal Formulation: With patient Time For Goal Achievement: 07/11/18 Potential to Achieve Goals: Fair Progress towards PT goals: Progressing toward  goals    Frequency    Min 3X/week      PT Plan Current plan remains appropriate    Co-evaluation PT/OT/SLP Co-Evaluation/Treatment: Yes Reason for Co-Treatment: For patient/therapist safety;Complexity of the patient's impairments (multi-system involvement) PT goals addressed during session: Mobility/safety with mobility        AM-PAC PT "6 Clicks" Mobility   Outcome Measure  Help needed turning from your back to your side while in a flat bed without using bedrails?: A Lot Help needed moving from  lying on your back to sitting on the side of a flat bed without using bedrails?: Total Help needed moving to and from a bed to a chair (including a wheelchair)?: A Lot Help needed standing up from a chair using your arms (e.g., wheelchair or bedside chair)?: A Lot Help needed to walk in hospital room?: A Lot Help needed climbing 3-5 steps with a railing? : A Lot 6 Click Score: 11    End of Session   Activity Tolerance: Patient limited by pain;Patient limited by fatigue Patient left: in chair;with call bell/phone within reach;with chair alarm set Nurse Communication: Mobility status;Need for lift equipment;Precautions PT Visit Diagnosis: Unsteadiness on feet (R26.81);Other abnormalities of gait and mobility (R26.89);Muscle weakness (generalized) (M62.81);Difficulty in walking, not elsewhere classified (R26.2);Pain Pain - Right/Left: Right Pain - part of body: (back and LE R>L)     Time: 0932-3557 PT Time Calculation (min) (ACUTE ONLY): 28 min  Charges:  $Therapeutic Activity: 8-22 mins                     Tanetta Fuhriman B. Beverely Risen PT, DPT Acute Rehabilitation Services Pager [phone] Office [phone]    Elon Alas Fleet 07/02/2018, 1:47 PM

## 2018-07-02 NOTE — Progress Notes (Addendum)
Had an extended phone conference conversation with the patient's daughter, son and for a short while the patient himself from 6:00 to approximately 6:45.  Daughter and son were asking questions about the pathology results and next steps/options.  Most of these questions I deferred to Dr. Marin Olp.  They asked about their fathers emotional health.  They were concerned that he may need an antidepressant but would refuse to take it.    They described an incident from approximately 7:00 pm Sunday night when the patient described seeing rats on the floor and objects moving on the wall.   We discussed the possibility that this delirium may have been medication related vs possibly cancer related.  At one point the patient was added into the call.  He had spoken with Dr. Lorin Mercy and wanted to check on his dilaudid dosage.  I talked with his children about opioid conversions.  15 mg of oxy IR is roughly equivalent to 4.5 mg of oral dilaudid.  Consequently I felt that a dose of 4 mg of oral dilaudid PRN is safe.    We reviewed discharge options - home with home health vs home with Hospice services based on what treatment options are available.   Family is hopeful that he will stay in the hospital during his radiation treatment.  Patient, son and daughter were very appreciative of the conversation.  I am concerned about the apparent episode of delirium Sunday evening.  I will discontinue the scheduled benzodiazepine at bedtime, and because he is currently comfortable on PRN dilaudid I will DC his final dose of Oxycontin.   Will monitor for further delirium.  Question if he needs imaging of his brain.  Florentina Jenny, PA-C Palliative Medicine Pager: [phone]   No charge note

## 2018-07-02 NOTE — Progress Notes (Signed)
Made aware that patient had a bed available at [facility].  Report called to [place], RN on 5E @ 1400.  Pt transferred to [facility] via [facility] in stable condition.  Eliezer Bottom [place]

## 2018-07-02 NOTE — Progress Notes (Signed)
I heard unit secretary that patient needs to be in [facility] by 2pm for a procedure, I asked the nurse and she is not aware of the procedure .this Probation officer called [facility] oncology and they stated that patient was scheduled at 2pm, called [facility] and [facility] stated that they will not be able to meet 2pm due to some covid patients that needed to be transported. Oncology department called and inform about transport and they re scheduled procedure in am 07/03/2018 8am. RN made aware.  Bed approved for this patient to be moved to [place]. [facility] called. RN made awrae of the available room.

## 2018-07-02 NOTE — Care Management (Signed)
Dan with [facility] accepted referral. Aware of transfer to [facility] today. Magdalen Spatz RN BSN [phone]

## 2018-07-02 NOTE — Progress Notes (Signed)
Occupational Therapy Treatment Patient Details Name: Lucas Morales MRN: [id number] DOB: [birth year] Today's Date: 07/02/2018    History of present illness This is an 85 year old male with a past medical history including hypertension, depression and anxiety, coronary artery disease, chronic interstitial cystitis, previous left hip fracture, previous cervical fusion, hyperlipidemia, restless leg syndrome, diabetes, arthritis, alpha 1 antitrypsin deficiency carrier, anemia, chronic back pain who was admitted for suspected postoperative lumbar decompression at L3-4 infection.  Surgery date was 05/10/2018. MRI suspicious for osteomyelitis. CT scan revealed R medial acetabular fx as well as lesions on liver suspicious for cancer.    OT comments  Pt continues to progress with therapist this session to [facility] with positive void of bowels. Pt with scrotal sling adjusted and extensive education on fit and positioning. OT adding additional note just for scrotal sling to help staff apply sling at [facility] long. Staff from OT notified regarding patients transfer.    Follow Up Recommendations  SNF;Supervision/Assistance - 24 hour    Equipment Recommendations  3 in 1 bedside commode    Recommendations for Other Services      Precautions / Restrictions Precautions Precautions: Fall Restrictions Weight Bearing Restrictions: Yes RLE Weight Bearing: Weight bearing as tolerated       Mobility Bed Mobility Overal bed mobility: Needs Assistance Bed Mobility: Supine to Sit;Sit to Supine     Supine to sit: Min assist;+2 for physical assistance Sit to supine: Mod assist;+2 for physical assistance   General bed mobility comments: min A to come to EoB, mod assist to return LE to bed  Transfers Overall transfer level: Needs assistance Equipment used: Rolling walker (2 wheeled) Transfers: Sit to/from [facility] Sit to Stand: Min assist Stand pivot transfers: Min assist        General transfer comment: minA for sit<>stand from higher bed surface, pivot to [facility] and then min physical assist from [facility].    Balance Overall balance assessment: Needs assistance   Sitting balance-Leahy Scale: Fair       Standing balance-Leahy Scale: Poor                             ADL either performed or assessed with clinical judgement   ADL Overall ADL's : Needs assistance/impaired Eating/Feeding: Set up                       Toilet Transfer: Minimal assistance   Toileting- Clothing Manipulation and Hygiene: Sit to/from stand;Total assistance         General ADL Comments: pt agreeable to  wearing scrotal sling and to [facility]. pt educated that sling is don supine and then checked in standing. once patient sits up the sling should not be adjusted as once pt stands the sling will be proper length     Vision       Perception     Praxis      Cognition Arousal/Alertness: Awake/alert Behavior During Therapy: WFL for tasks assessed/performed Overall Cognitive Status: Within Functional Limits for tasks assessed                                 General Comments: Pt's son awaiting COVID testing so pt can d/c home. Pt with decreased understanding of his condition and unrealistic idea of his return to [facility]        Exercises  Shoulder Instructions       General Comments pt with proper fit and position on scrotal sling. The sling shoudl be under the indwelling foley to protect the penis from injury    Pertinent Vitals/ Pain       Pain Assessment: Faces Faces Pain Scale: Hurts whole lot Pain Location: back, R hip and scrotum  Pain Descriptors / Indicators: Cramping;Jabbing;Tightness Pain Intervention(s): Limited activity within patient's tolerance;Monitored during session;Premedicated before session;Repositioned  Home Living                                          Prior Functioning/Environment               Frequency  Min 3X/week        Progress Toward Goals  OT Goals(current goals can now be found in the care plan section)  Progress towards OT goals: Progressing toward goals  Acute Rehab OT Goals Patient Stated Goal: to go home OT Goal Formulation: With patient Time For Goal Achievement: 07/11/18 Potential to Achieve Goals: Good ADL Goals Pt Will Perform Grooming: Independently;sitting Pt Will Perform Upper Body Dressing: with set-up;sitting Pt Will Perform Lower Body Dressing: with supervision;sitting/lateral leans Pt Will Perform Toileting - Clothing Manipulation and hygiene: with supervision;sitting/lateral leans Pt/caregiver will Perform Home Exercise Program: Both right and left upper extremity;With written HEP provided;Independently Additional ADL Goal #1: Pt will state 3 energy conservation strategies to implement for treatment here and home.  Plan Discharge plan remains appropriate    Co-evaluation    PT/OT/SLP Co-Evaluation/Treatment: Yes Reason for Co-Treatment: Complexity of the patient's impairments (multi-system involvement);Necessary to address cognition/behavior during functional activity;For patient/therapist safety;To address functional/ADL transfers PT goals addressed during session: Mobility/safety with mobility OT goals addressed during session: ADL's and self-care;Strengthening/ROM;Proper use of Adaptive equipment and DME      AM-PAC OT "6 Clicks" Daily Activity     Outcome Measure   Help from another person eating meals?: None Help from another person taking care of personal grooming?: None Help from another person toileting, which includes using toliet, bedpan, or urinal?: A Lot Help from another person bathing (including washing, rinsing, drying)?: A Lot Help from another person to put on and taking off regular upper body clothing?: A Little Help from another person to put on and taking off regular lower body clothing?: A Lot 6 Click Score:  17    End of Session Equipment Utilized During Treatment: Gait belt;Rolling walker  OT Visit Diagnosis: Unsteadiness on feet (R26.81);Other abnormalities of gait and mobility (R26.89);Muscle weakness (generalized) (M62.81)   Activity Tolerance Patient tolerated treatment well   Patient Left in bed;with call bell/phone within reach   Nurse Communication Mobility status;Precautions        Time: 0347-4259 OT Time Calculation (min): 23 min  Charges: OT General Charges $OT Visit: 1 Visit OT Treatments $Self Care/Home Management : 8-22 mins   Jeri Modena, OTR/L  Acute Rehabilitation Services Pager: [phone] Office: [phone] .    Jeri Modena 07/02/2018, 4:03 PM

## 2018-07-02 NOTE — Progress Notes (Addendum)
OT NOTE  RN STAFF  Scrotal sling to be worn to patient's tolerance. DON SLING IN SUPINE  It is beneficial for him to wear as much as possible as it elevated and compresses to decrease edema and pain.   Please check scrotal sling every 4 hours during shift to assess for : *positioning * increased pain * increased redness * increased swelling  If any symptoms above present remove sling for 15 minutes. If symptoms continue - keep the sling removed and notify OT staff [phone] immediately.   Keep scrotum elevated at all times on rolled up towels.  Splint can be cleaned with warm soapy water and air dried.          The sling is a long strip of stockinette ( as in like 5 ft long stockinette). "The sling" is a center section that is cut to help hold the scrotum (basically made a pocket out of the middle of the strip of stockinette. The two sides come up the patient's chest and cross at the scapula and then come to the front of the patient's chest to tie ( figure 8 across the scapula like a clavicle fx). The sling should look like he is wearing a long necklace and cross at his scapula then wrap his chest to tie in the front.    Jeri Modena, OTR/L  Acute Rehabilitation Services Pager: [phone] Office: [phone] .

## 2018-07-02 NOTE — Telephone Encounter (Signed)
I called the patient's daughter and via teleconference, her brother and father were able to share the information about the high grade malignancy. Path is going to run test of origin on the tissue. The plan is for transfer to [facility] today and move forward with simulation today and treatment tomorrow.

## 2018-07-02 NOTE — Progress Notes (Signed)
There is still no pathology back yet.  I am not sure what it is taking so long.  Possibly, the are shortstaffed because of the coronavirus.  He was seen by radiation oncology.  They agree to start radiation therapy on him.  He has this scrotal swelling which I am sure is from compression of lymphatics by this right pelvic mass.  Hopefully, with radiation and shrinkage of the pelvic mass, the scrotal swelling will improve.  I would think that radiation therapy is daily, Monday to Friday.  It sounds like he will get 10 treatments.  He has had better bowel movements.  He started a little lactulose on him.  He has had no cough.  He is on Lovenox.  There is no bleeding.  His labs look okay.  White cell count is 9.3.  Hemoglobin 11.  Platelet count 254,000.  His BUN is 54 and creatinine 1.33.  I am sure the BUN is up because of his steroids that we have on.  His blood sugar is 131.  He does have a little bit of a erratic heart rhythm.  An EKG back in February showed ventricular bigeminy.  I probably would repeat the EKG.  There is still some swelling in his right leg.  He is wearing the compression stocking.  Again, we are sort of limited because we do not have the pathology report back.  I will try calling pathology today.  Maybe they can give me a preliminary reading.  Lattie Haw, MD  [place] 7:7-8

## 2018-07-02 NOTE — Care Management Important Message (Signed)
Important Message  Patient Details  Name: Lucas Morales MRN: [id number] Date of Birth: 09/03/1933   Medicare Important Message Given:  Yes    Kratos Ruscitti [address] 07/02/2018, 10:28 AM

## 2018-07-02 NOTE — Progress Notes (Signed)
I called patient and discussed plan transfer today to [facility] since he will have simulation today at 2 PM and will be able to start his radiation treatments on Wednesday.  Total of 10 treatments are planned at this point.  Pain medication switched to Duragesic patch and Dilaudid by oncology PA. tranfer to [facility] today to be on Hospitalist service.

## 2018-07-03 ENCOUNTER — Ambulatory Visit
Admit: 2018-07-03 | Discharge: 2018-07-03 | Disposition: A | Discharge status: Home / Self Care | Payer: Medicare Other | Attending: Radiation Oncology | Admitting: Radiation Oncology

## 2018-07-03 ENCOUNTER — Inpatient Hospital Stay (HOSPITAL_COMMUNITY): Payer: Medicare Other

## 2018-07-03 ENCOUNTER — Ambulatory Visit: Payer: Medicare Other | Admitting: Radiation Oncology

## 2018-07-03 DIAGNOSIS — R918 Other nonspecific abnormal finding of lung field: Secondary | ICD-10-CM | POA: Diagnosis present

## 2018-07-03 DIAGNOSIS — C799 Secondary malignant neoplasm of unspecified site: Secondary | ICD-10-CM

## 2018-07-03 DIAGNOSIS — N3289 Other specified disorders of bladder: Secondary | ICD-10-CM

## 2018-07-03 LAB — GLUCOSE, CAPILLARY
Glucose-Capillary: 120 mg/dL — ABNORMAL HIGH (ref 70–99)
Glucose-Capillary: 130 mg/dL — ABNORMAL HIGH (ref 70–99)

## 2018-07-03 LAB — LACTATE DEHYDROGENASE: LDH: 221 U/L — ABNORMAL HIGH (ref 98–192)

## 2018-07-03 MED ORDER — HYDROMORPHONE HCL 1 MG/ML IJ SOLN
1.0000 mg | INTRAMUSCULAR | Status: DC | PRN
  Administered 2018-07-03 – 2018-07-04 (×4): 1 mg via INTRAVENOUS
  Filled 2018-07-03 (×5): qty 1

## 2018-07-03 MED ORDER — GADOBUTROL 1 MMOL/ML IV SOLN
10.0000 mL | Freq: Once | INTRAVENOUS | Status: AC | PRN
  Administered 2018-07-03: 11:00:00 10 mL via INTRAVENOUS

## 2018-07-03 MED ORDER — PHENAZOPYRIDINE HCL 100 MG PO TABS
100.0000 mg | ORAL_TABLET | Freq: Three times a day (TID) | ORAL | Status: DC
  Administered 2018-07-04 – 2018-07-06 (×5): 100 mg via ORAL
  Filled 2018-07-03 (×8): qty 1

## 2018-07-03 MED ORDER — HYDROMORPHONE HCL 2 MG PO TABS
4.0000 mg | ORAL_TABLET | ORAL | Status: DC | PRN
  Administered 2018-07-03 – 2018-07-05 (×5): 4 mg via ORAL
  Filled 2018-07-03 (×5): qty 2

## 2018-07-03 MED ORDER — POLYETHYLENE GLYCOL 3350 17 G PO PACK
17.0000 g | PACK | Freq: Every day | ORAL | Status: DC
  Administered 2018-07-04: 17 g via ORAL
  Filled 2018-07-03 (×2): qty 1

## 2018-07-03 MED ORDER — LACTULOSE 10 GM/15ML PO SOLN
20.0000 g | Freq: Every day | ORAL | Status: DC
  Administered 2018-07-04 – 2018-07-05 (×2): 20 g via ORAL
  Filled 2018-07-03 (×2): qty 30

## 2018-07-03 NOTE — Progress Notes (Signed)
NT took patient's blood sugar and result was 130, had difficulty with machine & result not showing.

## 2018-07-03 NOTE — Progress Notes (Signed)
Occupational Therapy Treatment Patient Details Name: Lucas Morales MRN: [id number] DOB: Jul 22, 1933 Today's Date: 07/03/2018    History of present illness This is an 85 year old male with a past medical history including hypertension, depression and anxiety, coronary artery disease, chronic interstitial cystitis, previous left hip fracture, previous cervical fusion, hyperlipidemia, restless leg syndrome, diabetes, arthritis, alpha 1 antitrypsin deficiency carrier, anemia, chronic back pain who was admitted for suspected postoperative lumbar decompression at L3-4 infection.  Surgery date was 05/10/2018. MRI suspicious for osteomyelitis. CT scan revealed R medial acetabular fx as well as lesions on liver suspicious for cancer.    OT comments  Used commode and transferred to chair.  Pt needs total A for hygiene.  He stood one additional time and lift pad (maximove) placed under him, in case he is too tired for SPT back to bed.  Sling looked OK and pt tolerating    Follow Up Recommendations  SNF;Supervision/Assistance - 24 hour    Equipment Recommendations  3 in 1 bedside commode    Recommendations for Other Services      Precautions / Restrictions Precautions Precautions: Fall Restrictions Weight Bearing Restrictions: No RLE Weight Bearing: Weight bearing as tolerated       Mobility Bed Mobility Overal bed mobility: Needs Assistance Bed Mobility: Supine to Sit     Supine to sit: Min assist;+2 for physical assistance Sit to supine: Mod assist;+2 for physical assistance   General bed mobility comments: pt started legs off bed and then rolled using bedrail  Transfers Overall transfer level: Needs assistance Equipment used: Rolling walker (2 wheeled) Transfers: Sit to/from [facility] Sit to Stand: Min assist;+2 safety/equipment Stand pivot transfers: Min assist;+2 safety/equipment;Min guard       General transfer comment: min A for sit<>stand from higher  bed surface, pivot to BSC with RW and then min physical assist from [facility] to recliner. VCs hand placement.    Balance Overall balance assessment: Needs assistance   Sitting balance-Leahy Scale: Fair       Standing balance-Leahy Scale: Poor Standing balance comment: relies on BUE support                           ADL either performed or assessed with clinical judgement   ADL                           Toilet Transfer: Minimal assistance;+2 for safety/equipment;Stand-pivot;BSC   Toileting- Clothing Manipulation and Hygiene: Total assistance;Sit to/from stand         General ADL Comments: Pt used BSC then transferred to chair.  Checked sling and pt tolerating well without reddened skin.  Pt could have used a gown change, but he did not want to do this at time seen     Vision       Perception     Praxis      Cognition Arousal/Alertness: Awake/alert Behavior During Therapy: WFL for tasks assessed/performed Overall Cognitive Status: Within Functional Limits for tasks assessed                                 General Comments: Pt's son awaiting COVID testing so pt can d/c home. Pt with decreased understanding of his condition and unrealistic idea of his return to [facility]        Exercises General Exercises - Lower Extremity Ankle Circles/Pumps:  AROM;Both;Supine;10 reps Short Arc QuadSinclair Morales;Left;10 reps;Supine(too painful on R)   Shoulder Instructions       General Comments      Pertinent Vitals/ Pain       Faces Pain Scale: Hurts even more Pain Location: back, R hip and scrotum  Pain Descriptors / Indicators: Grimacing Pain Intervention(s): Limited activity within patient's tolerance;Monitored during session;Repositioned  Home Living                                          Prior Functioning/Environment              Frequency  Min 3X/week        Progress Toward Goals  OT Goals(current goals can now  be found in the care plan section)  Progress towards OT goals: Progressing toward goals  Acute Rehab OT Goals Patient Stated Goal: to go home  Plan      Co-evaluation    PT/OT/SLP Co-Evaluation/Treatment: Yes Reason for Co-Treatment: For patient/therapist safety PT goals addressed during session: Mobility/safety with mobility OT goals addressed during session: ADL's and self-care      AM-PAC OT "6 Clicks" Daily Activity     Outcome Measure   Help from another person eating meals?: None Help from another person taking care of personal grooming?: A Little Help from another person toileting, which includes using toliet, bedpan, or urinal?: A Lot Help from another person bathing (including washing, rinsing, drying)?: A Lot Help from another person to put on and taking off regular upper body clothing?: A Little Help from another person to put on and taking off regular lower body clothing?: A Lot 6 Click Score: 16    End of Session    OT Visit Diagnosis: Unsteadiness on feet (R26.81);Other abnormalities of gait and mobility (R26.89);Muscle weakness (generalized) (M62.81)   Activity Tolerance Patient tolerated treatment well   Patient Left in bed;with call bell/phone within reach   Nurse Communication          Time: 3295-1884 OT Time Calculation (min): 25 min  Charges: OT General Charges $OT Visit: 1 Visit OT Treatments $Self Care/Home Management : 8-22 mins  Lesle Chris, OTR/L Acute Rehabilitation Services [phone] [place] pager [phone] office 07/03/2018   [place] 07/03/2018, 3:36 PM

## 2018-07-03 NOTE — Progress Notes (Signed)
[place] for Enoxaparin Indication: Blood clots/IVC thrombus and DVT  Allergies  Allergen Reactions  . Morphine Itching    Patient Measurements: Height: 6\' 2"  (188 cm) Weight: 223 lb 4.8 oz (101.3 kg) IBW/kg (Calculated) : 82.2 Heparin Dosing Weight: 101kg  Vital Signs: Temp: 97.4 F (36.3 C) (04/15 0501) Temp Source: Oral (04/15 0501) BP: 159/100 (04/15 0501) Pulse Rate: 102 (04/15 0501)  Labs: Recent Labs    07/01/18 0318 07/02/18 0420  HGB 11.0* 11.0*  HCT 33.2* 33.5*  PLT 240 254  CREATININE 1.45* 1.33*    Estimated Creatinine Clearance: 54.4 mL/min (A) (by C-G formula based on SCr of 1.33 mg/dL (H)).   Assessment: 85 yo male with recent back surgery now found to have possible malignancy. 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 Also has DVT. Transitioned to Lovenox post biopsy.   Patient noted to have AKI suspected due to contrast and dehydration that is improving with SCr downward trend at 1.33, CrCl ~55 ml/min. Good UOP recorded. H/H remains stable. Platelets are within normal limits. No bleeding reported.   Goal of Therapy:  Monitor platelets by anticoagulation protocol: Yes   Plan:  Continue Lovenox 100 mg subq q12hr CBC at least q72h F/u SCr 4/17 Monitor for bleeding complications and platelet count.   Thanks for allowing pharmacy to be a part of this patient's care.  Peggyann Juba, PharmD, BCPS Pager: [phone] 07/03/2018, 9:18 AM

## 2018-07-03 NOTE — Progress Notes (Signed)
Daily Progress Note   Patient Name: Lucas Morales       Date: 04/21/5425 DOB: 1933/04/19  Age: 85 y.o. MRN#: [id number] Attending Physician: [place] Eric J, DO Primary Care Physician: Deland Pretty, MD Admit Date: 06/21/2018  Reason for Consultation/Follow-up: Non pain symptom management, Pain control and Psychosocial/spiritual support  Subjective: Spoke with Lucas Morales over the phone.  He expressed several concerns to me. 1.  His bowels are now beginning to become runny. 2.  Having significant stabbing pain in his genitalia (foley in place) 3.  Concerned about being kept in the hospital and declining rapidly before he can have his will signed and notarized. 4.  Concerned about backing up his computer with is memoirs. (putting them on a separate drive). 5.  He expresses grief over his prognosis.  We discussed potential medications for his stabbing genitalia pain. He is on flomax.  It seems safe to try Azo for spasm. He was on miralax BID, lactulose BID, colace BID, Senna - for bowels.  I will reduce this regimen and continue to monitor for constipation.    At this point Lucas Morales is tolerating the right sided low back and leg pain with the fentanyl TD 12.5 and dilaudid PRN.  I discussed having his children bring me his will and a back up drive for his computer so that I can take them to him.   Assessment: Starting XRT for pain caused by high grade metastatic cancer.     Patient Profile/HPI:  85 y.o.malewith past medical history of CAD, Afib, alpha 1 antitrypsin deficiency, interstitial cystitis (with a tendency to have urinary retention), DM, depression and anxietywho was admitted on 4/3/2020with what was thought to be a post op infection at L3-L4. Work up has revealed widespread  (lungs, liver, pelvis, bone) metastatic cancer of undetermined primary.Lucas Morales is going for a biopsy this morning. PMT has been asked to consult for symptom management.  Length of Stay: 12  Current Medications: Scheduled Meds:  . dexamethasone  8 mg Oral Daily  . enoxaparin (LOVENOX) injection  1 mg/kg Subcutaneous Q12H  . feeding supplement (ENSURE ENLIVE)  237 mL Oral BID BM  . feeding supplement (PRO-STAT SUGAR FREE 64)  30 mL Oral BID WC  . fentaNYL  1 patch Transdermal Q72H  . fluconazole  100 mg Oral Daily  .  insulin aspart  0-15 Units Subcutaneous TID WC  . insulin glargine  10 Units Subcutaneous QHS  . [START ON 07/04/2018] lactulose  20 g Oral Daily  . metoprolol succinate  50 mg Oral Daily  . multivitamin with minerals  1 tablet Oral Daily  . pantoprazole  40 mg Oral BID  . [START ON 07/04/2018] phenazopyridine  100 mg Oral TID WC  . [START ON 07/04/2018] polyethylene glycol  17 g Oral Daily  . senna  2 tablet Oral QHS  . sodium bicarbonate  1,300 mg Oral BID  . sodium chloride flush  10-40 mL Intracatheter Q12H  . tamsulosin  0.4 mg Oral Daily    Continuous Infusions: . sodium chloride 45 mL/hr at 07/02/18 2043    PRN Meds: alum & mag hydroxide-simeth, bisacodyl, HYDROmorphone (DILAUDID) injection, HYDROmorphone, LORazepam, menthol-cetylpyridinium, ondansetron **OR** ondansetron (ZOFRAN) IV, sodium chloride flush   Vital Signs: BP (!) 149/80   Pulse 82   Temp 98.2 F (36.8 C) (Oral)   Resp 16   Ht 6\' 2"  (1.88 m)   Wt 101.3 kg   SpO2 98%   BMI 28.67 kg/m  SpO2: SpO2: 98 % O2 Device: O2 Device: Room Air O2 Flow Rate: O2 Flow Rate (L/min): 2 L/min  Intake/output summary:   Intake/Output Summary (Last 24 hours) at 07/03/2018 1725 Last data filed at 07/03/2018 1430 Gross per 24 hour  Intake 2325.69 ml  Output 1701 ml  Net 624.69 ml   LBM: Last BM Date: 07/03/18 Baseline Weight: Weight: 101.3 kg Most recent weight: Weight: 101.3 kg        Palliative Assessment/Data:  50%    Flowsheet Rows     Most Recent Value  Intake Tab  Referral Department  Oncology  Unit at Time of Referral  Med/Surg Unit  Palliative Care Primary Diagnosis  Cancer  Date Notified  06/26/18  Palliative Care Type  New Palliative care  Reason for referral  Non-pain Symptom  Date of Admission  06/21/18  # of days IP prior to Palliative referral  5  Clinical Assessment  Psychosocial & Spiritual Assessment  Palliative Care Outcomes      Patient Active Problem List   Diagnosis Date Noted  . Lung nodules 07/03/2018  . Metastatic cancer ([place])   . Pelvic mass   . Type II diabetes mellitus ([place]) 06/27/2018  . Coronary artery disease 06/27/2018  . AKI (acute kidney injury) ([place]) 06/27/2018  . Cancer associated pain   . DNR (do not resuscitate)   . Palliative care encounter   . Deep venous thrombosis ([place]) 06/26/2018  . Infection of lumbar spine ([place]) 06/21/2018  . Status post lumbar spine surgery for decompression of spinal cord 05/24/2018  . Abnormal EKG 05/25/2017  . S/P cervical spinal fusion 01/25/2016  . Cervical spinal stenosis 12/20/2015  . Cervical spondylosis 12/20/2015  . Painful orthopaedic hardware left hip with chronic trochanteric bursitis 07/30/2015  . Hip bursitis 07/30/2015  . Trochanteric bursitis of left hip 07/30/2015  . Chest pain with moderate risk of acute coronary syndrome 10/19/2014  . Macular hole, left eye 09/01/2014  . Macular hole of left eye 08/28/2014  . Other pancytopenia ([place])   . Sliding hiatal hernia   . Gastric erosions   . GI bleeding 06/04/2014  . Acute GI bleeding 06/04/2014  . Orthostatic hypotension 06/04/2014  . Acute upper gastrointestinal bleeding 06/04/2014  . Other specified diabetes mellitus without complications ([place])   . Acute blood loss anemia   . Thrombocytopenia ([place])   .  Closed left hip fracture ([place]) 04/03/2014  . Fracture, femur ([place]) 04/03/2014  . Femur fracture ([place]) 04/03/2014   . Upper airway cough syndrome 05/16/2013  . OSA (obstructive sleep apnea) 10/21/2012  . Macular hole 10/08/2012  . Preoperative clearance 09/30/2012  . PAF (paroxysmal atrial fibrillation) ([place]) 01/21/2012  . Shortness of breath on exertion 01/11/2012  . Bradycardia, sinus 01/11/2012  . Hx of CABG 01/11/2012  . Essential hypertension   . Dyslipidemia   . ABDOMINAL PAIN-LLQ 02/04/2010  . HEMORRHOIDS 08/31/2008  . ESOPHAGEAL STRICTURE 08/31/2008  . GERD 08/31/2008  . Gastritis and gastroduodenitis 08/31/2008  . HIATAL HERNIA 08/31/2008  . DIVERTICULOSIS, COLON 08/31/2008  . Chronic interstitial cystitis 08/31/2008  . COLONIC POLYPS, ADENOMATOUS, HX OF 08/31/2008    Palliative Care Plan    Recommendations/Plan:  Will decrease bowel regimen  Add Azo for spasm (This will turn his urine dark orange)  Will work with his children to support his practical and emotional issues.  Goals of Care and Additional Recommendations:  Limitations on Scope of Treatment: Full Scope Treatment  Code Status:  DNR  Prognosis:   Weeks to Months.  Discharge Planning:  To Be Determined  Care plan was discussed with Patient and children  Thank you for allowing the Palliative Medicine Team to assist in the care of this patient.  Total time spent:  35 min.     Greater than 50%  of this time was spent counseling and coordinating care related to the above assessment and plan. The above conversation was completed via telephone due to the visitor restrictions during the COVID-19 pandemic. Thorough chart review and discussion with necessary members of the care team was completed as part of assessment. All issues were discussed and addressed but no physical exam was performed.  Florentina Jenny, PA-C Palliative Medicine  Please contact Palliative MedicineTeam phone at [phone] for questions and concerns between 7 am - 7 pm.   Please see AMION for individual provider pager numbers.

## 2018-07-03 NOTE — Care Management (Signed)
    Durable Medical Equipment  (From admission, onward)         Start     Ordered   07/01/18 1628  For home use only DME Hospital bed  Once    Comments:  Daughter Torris House [phone]  Question Answer Comment  Patient has (list medical condition): right pelvic fracture , malignancy   The above medical condition requires: Patient requires the ability to reposition frequently   Head must be elevated greater than: 45 degrees   Bed type Semi-electric   Support Surface: Gel Overlay      07/01/18 1627   07/01/18 1625  For home use only DME lightweight manual wheelchair with seat cushion  Once    Comments:  Patient suffers from right pelvic fracture  which impairs their ability to perform daily activities like ambulating in the home.  A cane will not resolve  issue with performing activities of daily living. A wheelchair will allow patient to safely perform daily activities. Patient is not able to propel themselves in the home using a standard weight wheelchair due to right pelvic . Patient can self propel in the lightweight wheelchair.  Accessories: elevating leg rests (ELRs), wheel locks, extensions and anti-tippers.   07/01/18 1626   07/01/18 1542  For home use only DME standard manual wheelchair with seat cushion  Once    Comments:  Patient suffers from right pelvic fracture  Malignancy which impairs their ability to perform daily activities like ambulating  in the home.  A cane  will not resolve  issue with performing activities of daily living. A wheelchair will allow patient to safely perform daily activities. Patient can safely propel the wheelchair in the home or has a caregiver who can provide assistance.  Accessories: elevating leg rests (ELRs), wheel locks, extensions and anti-tippers.   07/01/18 1544   07/01/18 1541  For home use only DME 3 n 1  Once     07/01/18 1544

## 2018-07-03 NOTE — Progress Notes (Signed)
PT Cancellation Note  Patient Details Name: RITCHIE KLEE MRN: [id number] DOB: 03-15-1934   Cancelled Treatment:    Reason Eval/Treat Not Completed: Fatigue/lethargy limiting ability to participate(pt stated he's worn out from a "brutal MRI" but that he'd like to do PT/OT later today. Will follow. )  Philomena Doheny PT 07/03/2018  Acute Rehabilitation Services Pager [phone] Office [phone]

## 2018-07-03 NOTE — Progress Notes (Signed)
Patient had very large hard stool. Colace, lactulose and senokot given, patient refuse miralax. 3 assit to [facility] with walker. Will continue to monitor.

## 2018-07-03 NOTE — Progress Notes (Signed)
PROGRESS NOTE    Lucas Morales  BPZ:[id number] DOB: August 31, 1933 DOA: 06/21/2018 PCP: Lucas Pretty, MD    Brief Narrative:   Lucas Morales is a 85 y.o. malewith medical history significant of adenomatous polyps, alpha-1 antitrypsin deficiency carrier, anemia, anxiety, depression, osteoarthritis of the knees and back, easy bruising, chronic bronchitis, chronic lower back pain, CAD, history of frequent diarrhea due to partial colectomy, diverticulosis, episodes of diverticulitis, hemorrhoids, gastritis, hiatal hernia, GERD, esophageal stricture, esophageal dysmotility, history of post CABG paroxysmal A. fib, history of bradycardia, hyperlipidemia, hypertension, OSA not on CPAP due to intolerance, restless leg syndrome, sciatic nerve pain, type 2 diabetes, urinary frequency whohad lumbar decompression L3-4 on 05/10/2018 and was admitted about a week ago due to suspected L3 vertebral body osteomyelitis. However, after reviewing all imaging and getting new imaging studies, it was determined that the patient had a metastatic lesion. 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 He was seen by oncology 3 days ago. He underwent biopsy of the area with interventional radiology. We are seeinghimconsultdue to AKI, worsening LFTs and multiple medical issues.   Developed AKI: Suspect multifactorial secondary to contrast and dehydration. Patient was transferred from [facility] to [place] [facility] on 4/14 to start radiation treatment to pelvis mass.   Assessment & Plan:   Principal Problem:   AKI (acute kidney injury) ([place]) Active Problems:   Essential hypertension   Dyslipidemia   PAF (paroxysmal atrial fibrillation) (HCC)   OSA (obstructive sleep apnea)   Deep venous thrombosis (HCC)   Cancer associated pain   DNR (do not resuscitate)   Type II diabetes mellitus ([place])   Coronary artery disease   Pelvic mass in male   Metastatic cancer ([place])   Pelvic mass Multiple liver masses Multiple  lung nodules Patient initially admitted following lumbar decompression L3-4 on 05/10/2018 with suspected L3 osteomyelitis.  CT pelvis notable for a 13 x 10 x 10 cm mass arising from the right ischium extending into the posterior aspect of the right acetabulum and adjacent soft tissues. --Medical and radiation oncology following, appreciate assistance --s/p CT guided biopsy lytic right pelvic bone lesion on 06/27/2018 by IR --AFP 39.7, CEA 2.2, PSA 2.08 --Pathology notable for high-grade carcinoma with unknown primary origin --Differential for primary lesion include lung primary vs pelvic sarcoma vs cholangiocarcinoma --MR brain pending --Oncology believes this is an extremely aggressive tumor with prognosis 2-3 months --Continue pain control with fentanyl patch, Dilaudid 4 mg every 4 PO prn, and Decadron 8 mg p.o. daily; palliative care assisting with pain management --Received radiation planning treatment today, plan 10 fractions --Continue treatment dose Lovenox for hypercoagulable state given malignancy  Acute renal failure Creatinine elevated to a high of 1.94 during hospitalization.  Etiology likely secondary to decreased oral intake and contrast exposure from CT abdomen/pelvis as well as urinary retention.  Renal ultrasound unrevealing. --Continue Foley catheter --Creatinine improving 1.94-->1.33 today (0.96 on admission) --Avoid nephrotoxins, renally dose all medications --Continue to monitor renal function daily  Acute urinary retention Nausea likely secondary to edema and pelvic mass with compression. --Continue tamsulosin 0.4 mg p.o. daily --Continue Foley catheter  L3 lesion:  Likely metastatic disease and not consistent with infection, infectious disease initially consulted during hospitalization, antibiotics have subsequently discontinued.  Hypertension: Resume metoprolol.  HLD; holding due to transaminases.  Paroxysmal A. Fib: continue metoprolol succinate 50 mg p.o.  daily  OSA: Not on CPAP.  Continue with oxygen supplementation prn  Acute DVT, right soleal vein:  US duplex doppler with  findings consistent with acute deep vein thrombosis involving the right soleal vein. --Continue with treatment dose Lovenox.   Cancer associated pain:  Palliative care following, appreciate assistance. --Continue fentanyl patch, Dilaudid 4 mg p.o. every 4 hours, Decadron --Dilaudid 1 mg IV every 4 hours as needed severe pain  Type 2 diabetes:  --Lantus 10 units [place] qHS --Sliding scale insulin.   --Monitor glucose closely while on Decadron  Coronary artery disease: New beta-blocker  Transaminases: Suspect related to liver masses , stable.   Hyponatremia: Improved with IV fluids.  Hyperkalemia;  --Received dose of lokelma.  --Renal diet --Continue to monitor electrolytes closely  Metabolic acidosis --continue sodium bicarb 1300mg  PO BID   DVT prophylaxis: Lovenox 100 mg SQ every 12 hours Code Status: Full code Family Communication: Discussed with daughter, Lucas Morales over the telephone today Disposition Plan: New inpatient, PT recommends SNF versus home health; will likely remain inpatient for radiation treatments   Consultants:   Infectious disease  Radiation oncology  Medical oncology  Interventional radiology  Orthopedics  Procedures:  CT guided biopsy lytic right pelvic bone lesion on 06/27/2018 by IR  Antimicrobials:   Vancomycin 4/3 -4/6  Zosyn 4/3 - 4/4  Ceftriaxone 4/4 - 4/6   Subjective: Patient seen and examined at bedside, continues with mild pain and significant right lower extremity/scrotal swelling.  Just returned from radiation planning treatment.  Appears slightly distraught over receiving news from oncology today regarding his high-grade carcinoma pathology report, with unknown primary origin.  No other complaints at this time.  Denies headache, no visual changes, no chest pain, no palpitations, no shortness of  breath, no abdominal pain, no issues with bowel function, no paresthesias.  No acute events overnight per nursing staff.  Objective: Vitals:   06/27/18 1230 06/27/18 1247 06/29/18 0435 07/02/18 0745  BP: (!) 172/92 (!) 154/83 134/80 (!) 149/80  Pulse: 96 100 88 82  Resp: 18 18 18 16   Temp:  98.4 F (36.9 C) 98.2 F (36.8 C)   TempSrc:  Oral Oral   SpO2: 98% 93% 92% 98%  Weight:      Height:        Intake/Output Summary (Last 24 hours) at 07/03/2018 1213 Last data filed at 07/03/2018 1110 Gross per 24 hour  Intake 2205.69 ml  Output 1001 ml  Net 1204.69 ml   Filed Weights   06/21/18 1652  Weight: 101.3 kg    Examination:  General exam: Appears calm and comfortable  Respiratory system: Clear to auscultation. Respiratory effort normal. Cardiovascular system: S1 & S2 heard, RRR. No JVD, murmurs, rubs, gallops or clicks.  2+ bilateral pedal edema Gastrointestinal system: Abdomen is nondistended, soft and nontender. No organomegaly or masses felt. Normal bowel sounds heard. Genitourinary: Significant scrotal edema noted, Foley catheter in place Central nervous system: Alert and oriented. No focal neurological deficits. Extremities: Symmetric 5 x 5 power. Skin: No rashes, lesions or ulcers Psychiatry: Judgement and insight appear normal. Mood & affect appropriate.     Data Reviewed: I have personally reviewed following labs and imaging studies  CBC: Recent Labs  Lab 06/27/18 0655 06/28/18 0444 06/29/18 0500 06/30/18 0257 07/01/18 0318 07/02/18 0420  WBC 13.7* 8.3 11.6* 6.4 7.9 9.3  NEUTROABS 11.5* 7.2 11.4* 5.4 6.4  --   HGB 12.3* 11.4* 10.7* 10.4* 11.0* 11.0*  HCT 36.3* 34.5* 32.5* 31.6* 33.2* 33.5*  MCV 94.5 95.6 99.4 98.8 98.8 98.8  PLT 300 275 246 210 240 258   Basic Metabolic Panel: Recent Labs  Lab  06/28/18 0444 06/29/18 0500 06/30/18 0257 07/01/18 0318 07/02/18 0420  NA 130* 131* 132* 135 136  K 5.0 5.2* 5.3* 5.1 4.7  CL 99 101 101 103 105  CO2  18* 17* 19* 19* 21*  GLUCOSE 175* 176* 166* 149* 131*  BUN 65* 78* 74* 68* 54*  CREATININE 1.92* 1.75* 1.59* 1.45* 1.33*  CALCIUM 8.8* 8.4* 8.2* 8.1* 7.9*  MG  --   --   --   --  2.6*   GFR: Estimated Creatinine Clearance: 54.4 mL/min (A) (by C-G formula based on SCr of 1.33 mg/dL (H)). Liver Function Tests: Recent Labs  Lab 06/27/18 0655 06/28/18 0444 06/29/18 0500 06/30/18 0257 07/01/18 0318  AST 43* 37 44* 56* 59*  ALT 45* 41 43 55* 68*  ALKPHOS 508* 472* 415* 399* 443*  BILITOT 1.2 0.9 0.6 0.5 1.0  PROT 6.2* 5.9* 5.4* 5.5* 5.8*  ALBUMIN 2.7* 2.7* 2.6* 2.6* 2.8*   No results for input(s): LIPASE, AMYLASE in the last 168 hours. No results for input(s): AMMONIA in the last 168 hours. Coagulation Profile: No results for input(s): INR, PROTIME in the last 168 hours. Cardiac Enzymes: Recent Labs  Lab 06/28/18 1025  CKTOTAL 30*   BNP (last 3 results) No results for input(s): PROBNP in the last 8760 hours. HbA1C: No results for input(s): HGBA1C in the last 72 hours. CBG: Recent Labs  Lab 07/02/18 0727 07/02/18 1129 07/02/18 1704 07/02/18 2009 07/03/18 0752  GLUCAP 97 128* 146* 148* 120*   Lipid Profile: No results for input(s): CHOL, HDL, LDLCALC, TRIG, CHOLHDL, LDLDIRECT in the last 72 hours. Thyroid Function Tests: No results for input(s): TSH, T4TOTAL, FREET4, T3FREE, THYROIDAB in the last 72 hours. Anemia Panel: No results for input(s): VITAMINB12, FOLATE, FERRITIN, TIBC, IRON, RETICCTPCT in the last 72 hours. Sepsis Labs: No results for input(s): PROCALCITON, LATICACIDVEN in the last 168 hours.  No results found for this or any previous visit (from the past 240 hour(s)).       Radiology Studies: Mr Jeri Cos Wo Contrast  Result Date: 07/03/2018 CLINICAL DATA:  Neuroendocrine tumors of unknown primary, biopsy-proven, initial follow-up. Pelvic mass. EXAM: MRI HEAD WITHOUT AND WITH CONTRAST TECHNIQUE: Multiplanar, multiecho pulse sequences of the brain  and surrounding structures were obtained without and with intravenous contrast. CONTRAST:  10 mL Gadavist COMPARISON:  None FINDINGS: Brain: No acute infarct, hemorrhage, or mass lesion is present. Postcontrast images demonstrate no pathologic enhancement. There is no evidence for metastatic disease to the brain. Mild atrophy is within normal limits for age. There is no significant white matter disease. The ventricles are of proportionate to the degree of atrophy. No significant extraaxial fluid collection is present. The internal auditory canals are within normal limits bilaterally. The brainstem and cerebellum are within normal limits. Vascular: Flow is present in the major intracranial arteries. Skull and upper cervical spine: The craniocervical junction is normal. Upper cervical spine is within normal limits. Marrow signal is unremarkable. Sinuses/Orbits: The paranasal sinuses and mastoid air cells are clear. Bilateral lens replacements are noted. Globes and orbits are otherwise unremarkable. IMPRESSION: 1. No evidence for metastatic disease to the brain or meninges. 2. Normal MRI appearance of the brain for age. Electronically Signed   By: San Morelle M.D.   On: 07/03/2018 11:36        Scheduled Meds: . dexamethasone  8 mg Oral Daily  . docusate sodium  100 mg Oral BID  . enoxaparin (LOVENOX) injection  1 mg/kg Subcutaneous Q12H  .  feeding supplement (ENSURE ENLIVE)  237 mL Oral BID BM  . feeding supplement (PRO-STAT SUGAR FREE 64)  30 mL Oral BID WC  . fentaNYL  1 patch Transdermal Q72H  . fluconazole  100 mg Oral Daily  . insulin aspart  0-15 Units Subcutaneous TID WC  . insulin glargine  10 Units Subcutaneous QHS  . lactulose  20 g Oral BID  . metoprolol succinate  50 mg Oral Daily  . multivitamin with minerals  1 tablet Oral Daily  . pantoprazole  40 mg Oral BID  . polyethylene glycol  17 g Oral BID  . senna  2 tablet Oral QHS  . sodium bicarbonate  1,300 mg Oral BID  .  sodium chloride flush  10-40 mL Intracatheter Q12H  . tamsulosin  0.4 mg Oral Daily   Continuous Infusions: . sodium chloride 45 mL/hr at 07/02/18 2043     LOS: 12 days    Time spent: 32 minutes    Eric J [place] DO [facility] Pager [phone]  If 7PM-7AM, please contact night-coverage [email or web address] Password [facility] 07/03/2018, 12:13 PM

## 2018-07-03 NOTE — Progress Notes (Signed)
Physical Therapy Treatment Patient Details Name: Lucas Morales MRN: [id number] DOB: 03/28/1933 Today's Date: 07/03/2018    History of Present Illness This is an 85 year old male with a past medical history including hypertension, depression and anxiety, coronary artery disease, chronic interstitial cystitis, previous left hip fracture, previous cervical fusion, hyperlipidemia, restless leg syndrome, diabetes, arthritis, alpha 1 antitrypsin deficiency carrier, anemia, chronic back pain who was admitted for suspected postoperative lumbar decompression at L3-4 infection.  Surgery date was 05/10/2018. MRI suspicious for osteomyelitis. CT scan revealed R medial acetabular fx as well as lesions on liver suspicious for cancer.     PT Comments    +2 min assist for bed mobility and for stand pivot transfer x 2 with RW. Activity tolerance limited by R hip and scrotal pain, and 3/4 dyspnea with activity. Instructed pt in LE strengthening exercises to be done independently.   Follow Up Recommendations  SNF     Equipment Recommendations  Other (comment)(TBD)    Recommendations for Other Services       Precautions / Restrictions Precautions Precautions: Fall Restrictions Weight Bearing Restrictions: No RLE Weight Bearing: Weight bearing as tolerated    Mobility  Bed Mobility Overal bed mobility: Needs Assistance Bed Mobility: Supine to Sit     Supine to sit: Min assist;+2 for physical assistance     General bed mobility comments: min A to come to EoB, VCs for technique  Transfers Overall transfer level: Needs assistance Equipment used: Rolling walker (2 wheeled) Transfers: Sit to/from [facility] Sit to Stand: Min assist;+2 safety/equipment Stand pivot transfers: Min assist;+2 safety/equipment;Min guard       General transfer comment: min A for sit<>stand from higher bed surface, pivot to BSC with RW and then min physical assist from [facility] to recliner. VCs hand  placement.  Ambulation/Gait             General Gait Details: deferred 2* pain and 3/4 dyspnea with SPT x 2   Stairs             Wheelchair Mobility    Modified Rankin (Stroke Patients Only)       Balance Overall balance assessment: Needs assistance   Sitting balance-Leahy Scale: Fair       Standing balance-Leahy Scale: Poor Standing balance comment: relies on BUE support                            Cognition Arousal/Alertness: Awake/alert Behavior During Therapy: WFL for tasks assessed/performed Overall Cognitive Status: Within Functional Limits for tasks assessed                                 General Comments: Pt's son awaiting COVID testing so pt can d/c home. Pt with decreased understanding of his condition and unrealistic idea of his return to [facility]      Exercises General Exercises - Lower Extremity Ankle Circles/Pumps: AROM;Both;Supine;10 reps Short Arc QuadSinclair Ship;Left;10 reps;Supine(too painful on R)    General Comments        Pertinent Vitals/Pain Faces Pain Scale: Hurts even more Pain Location: back, R hip and scrotum  Pain Descriptors / Indicators: Grimacing Pain Intervention(s): Limited activity within patient's tolerance;Monitored during session;Repositioned;Premedicated before session    Home Living                      Prior Function  PT Goals (current goals can now be found in the care plan section) Acute Rehab PT Goals Patient Stated Goal: to go home PT Goal Formulation: With patient Time For Goal Achievement: 07/11/18 Potential to Achieve Goals: Fair Progress towards PT goals: Progressing toward goals    Frequency    Min 3X/week      PT Plan Current plan remains appropriate    Co-evaluation PT/OT/SLP Co-Evaluation/Treatment: Yes Reason for Co-Treatment: For patient/therapist safety;To address functional/ADL transfers PT goals addressed during session: Mobility/safety  with mobility;Balance;Proper use of DME;Strengthening/ROM        AM-PAC PT "6 Clicks" Mobility   Outcome Measure  Help needed turning from your back to your side while in a flat bed without using bedrails?: A Lot Help needed moving from lying on your back to sitting on the side of a flat bed without using bedrails?: A Little Help needed moving to and from a bed to a chair (including a wheelchair)?: A Little Help needed standing up from a chair using your arms (e.g., wheelchair or bedside chair)?: A Little Help needed to walk in hospital room?: A Lot Help needed climbing 3-5 steps with a railing? : A Lot 6 Click Score: 15    End of Session   Activity Tolerance: Patient limited by pain;Patient limited by fatigue Patient left: in chair;with call bell/phone within reach;with chair alarm set Nurse Communication: Mobility status;Need for lift equipment;Precautions PT Visit Diagnosis: Unsteadiness on feet (R26.81);Other abnormalities of gait and mobility (R26.89);Muscle weakness (generalized) (M62.81);Difficulty in walking, not elsewhere classified (R26.2);Pain Pain - Right/Left: Right Pain - part of body: Hip(back and LE R>L)     Time: 6808-8110 PT Time Calculation (min) (ACUTE ONLY): 32 min  Charges:  $Therapeutic Activity: 8-22 mins                     Lucas Morales PT 07/03/2018  Acute Rehabilitation Services Pager [phone] Office [phone]

## 2018-07-03 NOTE — Progress Notes (Signed)
OT Cancellation Note  Patient Details Name: Lucas Morales MRN: [id number] DOB: 1933-09-05   Cancelled Treatment:    Reason Eval/Treat Not Completed: Other (comment). Pt reports he has just returned from MRI and does not want to move nor have scrotal sling reapplied at this time. Will try to check back this pm  Brittin Belnap 07/03/2018, 11:33 AM  Lesle Chris, OTR/L Acute Rehabilitation Services [phone] [facility] pager [phone] office 07/03/2018

## 2018-07-03 NOTE — Progress Notes (Signed)
The biopsy came back on Dr. [name].  Unfortunately, this is a high-grade carcinoma.  The pathologist really cannot tell where the primary site of origin is.  This really is going to make things more difficult with respect to systemic therapy.  I will have to send the specimen off for molecular array studies to see if this can tell us what the primary site of origin is.  This may take a couple weeks.  We will send off studies for genetic abnormalities.  He wants to go to an academic [facility] for treatment.  I certainly understand this.  I told him that I thought that it would be important that he do his radiation therapy here.  The tumor in his pelvis is was causing a lot of his problems.  If we can at least treat this, then may be we will be able to help with his leg swelling, thromboembolic disease, and functional state.  He is on his Lovenox.  He is doing okay with this.  He has a Foley catheter in.  He really hates this.  I can understand.  He is not eating much at all.  Pain is still pretty significant.  We will have to see what we cannot do to help with his pain control.  Obviously, this is an incredibly aggressive tumor.  I think if nothing can be done to treat this systemically, then we probably are not looking at 2-3 months of prognosis.  This is already gone into his liver and lungs.  It may not be a bad idea to do an MRI of the brain to see if he has tumors in his brain.  I had a very long talk with him today about this situation.  It is understandable as to why he is very disappointed.  I told him that I am not sure as to what an academic [facility] could do for him that we cannot do here.  I really doubt that he is a candidate for any clinical trials given his performance status.  However, I would want to honor his request.  I think that [facility] probably would be a very good place for him to go to.  At least we can try to get some molecular studies done on his tumor wise  getting radiation treatments.  Lattie Haw, MD  [place] 6:16

## 2018-07-03 NOTE — TOC Progression Note (Addendum)
Transition of Care [facility]) - Progression Note    Patient Details  Name: Lucas Morales MRN: [id number] Date of Birth: 16-Jun-1933  [facility] CM/SW Contact  Servando Snare, [place] Phone Number: 07/03/2018, 10:25 AM  Clinical Narrative:   LCSW consulted for Advanced directives. [facility] are not doing advanced directives at this time due to the pandemic. Due to visitor restrictions logistically they cannot be complete in the hospital. Patient can seek assistance in the community.     Expected Discharge Plan: [place] Barriers to Discharge: Continued Medical Work up  Expected Discharge Plan and Services Expected Discharge Plan: [place]   Discharge Planning Services: CM Consult Post Acute Care Choice: Home Health                             Social Determinants of Health (SDOH) Interventions    Readmission Risk Interventions No flowsheet data found.

## 2018-07-04 ENCOUNTER — Ambulatory Visit: Payer: Medicare Other

## 2018-07-04 ENCOUNTER — Ambulatory Visit
Admit: 2018-07-04 | Discharge: 2018-07-04 | Disposition: A | Discharge status: Home / Self Care | Payer: Medicare Other | Attending: Radiation Oncology | Admitting: Radiation Oncology

## 2018-07-04 DIAGNOSIS — C799 Secondary malignant neoplasm of unspecified site: Secondary | ICD-10-CM

## 2018-07-04 LAB — COMPREHENSIVE METABOLIC PANEL
ALT: 88 U/L — ABNORMAL HIGH (ref 0–44)
AST: 51 U/L — ABNORMAL HIGH (ref 15–41)
Albumin: 2.9 g/dL — ABNORMAL LOW (ref 3.5–5.0)
Alkaline Phosphatase: 459 U/L — ABNORMAL HIGH (ref 38–126)
Anion gap: 8 (ref 5–15)
BUN: 42 mg/dL — ABNORMAL HIGH (ref 8–23)
CO2: 21 mmol/L — ABNORMAL LOW (ref 22–32)
Calcium: 7.3 mg/dL — ABNORMAL LOW (ref 8.9–10.3)
Chloride: 108 mmol/L (ref 98–111)
Creatinine, Ser: 1.16 mg/dL (ref 0.61–1.24)
GFR calc Af Amer: >60 mL/min (ref >60)
GFR calc non Af Amer: 58 mL/min — ABNORMAL LOW (ref >60)
Glucose, Bld: 134 mg/dL — ABNORMAL HIGH (ref 70–99)
Potassium: 4.5 mmol/L (ref 3.5–5.1)
Sodium: 137 mmol/L (ref 135–145)
Total Bilirubin: 1 mg/dL (ref 0.3–1.2)
Total Protein: 6 g/dL — ABNORMAL LOW (ref 6.5–8.1)

## 2018-07-04 LAB — GLUCOSE, CAPILLARY
Glucose-Capillary: 155 mg/dL — ABNORMAL HIGH (ref 70–99)
Glucose-Capillary: 153 mg/dL — ABNORMAL HIGH (ref 70–99)
Glucose-Capillary: 103 mg/dL — ABNORMAL HIGH (ref 70–99)
Glucose-Capillary: 132 mg/dL — ABNORMAL HIGH (ref 70–99)
Glucose-Capillary: 169 mg/dL — ABNORMAL HIGH (ref 70–99)
Glucose-Capillary: 143 mg/dL — ABNORMAL HIGH (ref 70–99)

## 2018-07-04 LAB — LACTATE DEHYDROGENASE: LDH: 242 U/L — ABNORMAL HIGH (ref 98–192)

## 2018-07-04 MED ORDER — IBUPROFEN 200 MG PO TABS
600.0000 mg | ORAL_TABLET | Freq: Four times a day (QID) | ORAL | Status: DC | PRN
  Administered 2018-07-05: 600 mg via ORAL
  Filled 2018-07-04 (×2): qty 3

## 2018-07-04 MED ORDER — ACETAMINOPHEN 325 MG PO TABS: 650.0000 mg | ORAL_TABLET | Freq: Four times a day (QID) | ORAL | Status: DC | PRN

## 2018-07-04 MED ORDER — FENTANYL 25 MCG/HR TD PT72
1.0000 | MEDICATED_PATCH | TRANSDERMAL | Status: DC
  Administered 2018-07-04 – 2018-07-10 (×3): 1 via TRANSDERMAL
  Filled 2018-07-04 (×3): qty 1

## 2018-07-04 MED ORDER — HYDROMORPHONE HCL 1 MG/ML IJ SOLN
0.5000 mg | INTRAMUSCULAR | Status: DC | PRN
  Administered 2018-07-05: 1 mg via INTRAVENOUS
  Filled 2018-07-04: qty 1

## 2018-07-04 NOTE — Progress Notes (Signed)
  Radiation Oncology         [phone] ________________________________  Name: Lucas Morales MRN: [id number]  Date: 07/04/2018  DOB: 04/17/1933  Simulation Verification Note    ICD-10-CM   1. Metastatic cancer ([place]) C79.9     Status: inpatient  NARRATIVE: The patient was brought to the treatment unit and placed in the planned treatment position. The clinical setup was verified. Then port films were obtained and uploaded to the radiation oncology medical record software.  The treatment beams were carefully compared against the planned radiation fields. The position location and shape of the radiation fields was reviewed. They targeted volume of tissue appears to be appropriately covered by the radiation beams. Organs at risk appear to be excluded as planned.  Based on my personal review, I approved the simulation verification. The patient's treatment will proceed as planned.  -----------------------------------  Blair Promise, PhD, MD

## 2018-07-04 NOTE — Progress Notes (Signed)
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  I am happy about this.  He has started radiation treatments.  I told him that the radiation is probably the best way of trying to help with respect to his pain and with the swelling in the right leg and the blood clots.  We will be able to send his biopsy specimen off for multiple molecular studies.  Maybe, we will be able to find what the likely primary site is of his cancer.  More importantly from a treatment standpoint, we will be able to see what treatment options are available.  He still is having a hard time.  He really is not able to get around much.  I know that his son is coming down to try to help him out.  This is going to be a very tough task.  His chemistry studies today show that his renal function is getting better.  Hopefully this is encouraging.  I think we still are going to have a tough battle with his cancer.  We will have to see how the radiation therapy goes.  Hopefully, this will give him some relief and allow him to have some improved function.   Lattie Haw, MD

## 2018-07-04 NOTE — Progress Notes (Signed)
Nutrition Follow-up  INTERVENTION:   -Continue Ensure Enlive po BID, each supplement provides 350 kcal and 20 grams of protein -Continue 30 ml Prostat BID, each supplement provides 100 kcals and 15 grams protein -Continue MVI with minerals daily -Continue Magic Cup TID with meals, each supplement provides 290 kcals and 9 grams protein  -Recommend new weight measurement (last recorded 4/3).  NUTRITION DIAGNOSIS:   Increased nutrient needs related to post-op healing as evidenced by estimated needs.  Ongoing.  GOAL:   Patient will meet greater than or equal to 90% of their needs  Progressing.  MONITOR:   PO intake, Supplement acceptance, Labs, Weight trends, I & O's, Skin  ASSESSMENT:   85 year old male lives alone is admitted for suspected postoperative lumbar spine infection s/p surgery for severe stenosis at L3-4  4/7-  CT scan revealed liver lesions, 3 liver tumors, and large mass next pelvis; antibiotics stopped 4/9- s/p CT core lesion biopsy of lytic rt pelvic bone lesion CT revealed high-grade carcinoma with unknown primary origin 4/14- Patient transferred from [facility] to [facility] for initiation of radiation therapy  **RD working remotely**  Patient reported to nursing that he did not like the [employer] supplements he was receiving at [facility]. Ensure supplements were ordered instead and pt has been consistently drinking these which is preferable since they contain higher amounts of protein and kcal. Currently consuming 25-75% of meals recently.  Per MD note, patient is to begin radiation today and remain inpatient for 10 fractions until 4/29.  No new weights have been measured since admission 4/3. Recommend new measurement.  Medications: Multivitamin with minerals daily Labs reviewed: CBGs: 130  Diet Order:   Diet Order            Diet renal with fluid restriction Room service appropriate? Yes; Fluid consistency: Thin  Diet effective now              EDUCATION  NEEDS:   Not appropriate for education at this time  Skin:  Skin Assessment: Skin Integrity Issues: Skin Integrity Issues:: Incisions Incisions: posterior pelvis  Last BM:  4/15  Height:   Ht Readings from Last 1 Encounters:  06/21/18 6\' 2"  (1.88 m)    Weight:   Wt Readings from Last 1 Encounters:  06/21/18 101.3 kg    Ideal Body Weight:  86.3 kg  BMI:  Body mass index is 28.67 kg/m.  Estimated Nutritional Needs:   Kcal:  2000-2300kcal/day   Protein:  100-120g/day   Fluid:  >2.1L/day   Clayton Bibles, MS, RD, LDN [place] Diet[facility] Pager: [phone] After Hours Pager: [phone]

## 2018-07-04 NOTE — Progress Notes (Signed)
Occupational Therapy Treatment Patient Details Name: Lucas Morales MRN: [id number] DOB: 1933-10-11 Today's Date: 07/04/2018    History of present illness This is an 85 year old male with a past medical history including hypertension, depression and anxiety, coronary artery disease, chronic interstitial cystitis, previous left hip fracture, previous cervical fusion, hyperlipidemia, restless leg syndrome, diabetes, arthritis, alpha 1 antitrypsin deficiency carrier, anemia, chronic back pain who was admitted for suspected postoperative lumbar decompression at L3-4 infection.  Surgery date was 05/10/2018. MRI suspicious for osteomyelitis. CT scan revealed R medial acetabular fx as well as lesions on liver suspicious for cancer.    OT comments  Checked on pt in late pm.  He had XRT this am and was sleeping when I checked shortly after 1300.  Sling was off and pt was uncomfortable. Assisted with bathing and reapplying sling.  Material used at [facility] is much softer and more heavy duty than what I have here. Will try to get another piece to alternate slings and clean them.   Follow Up Recommendations  SNF;Supervision/Assistance - 24 hour    Equipment Recommendations  3 in 1 bedside commode    Recommendations for Other Services      Precautions / Restrictions Precautions Precautions: Fall Restrictions RLE Weight Bearing: Weight bearing as tolerated       Mobility Bed Mobility     Rolling: Max assist         General bed mobility comments: +2 for adls  Transfers                      Balance                                           ADL either performed or assessed with clinical judgement   ADL           Upper Body Bathing: Minimal assistance;Bed level   Lower Body Bathing: Total assistance;Bed level   Upper Body Dressing : Minimal assistance;Sitting                     General ADL Comments: performed bathing from bed level and  reapplied sling support/washcloths.  Pt with increased pain when rolling     Vision       Perception     Praxis      Cognition Arousal/Alertness: Awake/alert Behavior During Therapy: WFL for tasks assessed/performed Overall Cognitive Status: Within Functional Limits for tasks assessed                                          Exercises     Shoulder Instructions       General Comments      Pertinent Vitals/ Pain       Pain Assessment: Faces Faces Pain Scale: Hurts whole lot Pain Location: back, R hip and scrotum  Pain Descriptors / Indicators: Grimacing Pain Intervention(s): Limited activity within patient's tolerance;Monitored during session;Repositioned;Patient requesting pain meds-RN notified;RN gave pain meds during session  Home Living                                          Prior Functioning/Environment  Frequency  Min 3X/week        Progress Toward Goals  OT Goals(current goals can now be found in the care plan section)  Progress towards OT goals: Progressing toward goals     Plan      Co-evaluation                 AM-PAC OT "6 Clicks" Daily Activity     Outcome Measure   Help from another person eating meals?: None Help from another person taking care of personal grooming?: A Little Help from another person toileting, which includes using toliet, bedpan, or urinal?: A Lot Help from another person bathing (including washing, rinsing, drying)?: A Lot Help from another person to put on and taking off regular upper body clothing?: A Little Help from another person to put on and taking off regular lower body clothing?: A Lot 6 Click Score: 16    End of Session    OT Visit Diagnosis: Unsteadiness on feet (R26.81);Other abnormalities of gait and mobility (R26.89);Muscle weakness (generalized) (M62.81)   Activity Tolerance Patient limited by pain   Patient Left in bed;with call  bell/phone within reach   Nurse Communication          Time: 8616-8372 OT Time Calculation (min): 29 min  Charges: OT General Charges $OT Visit: 1 Visit OT Treatments $Self Care/Home Management : 23-37 mins  Lesle Chris, OTR/L Acute Rehabilitation Services [phone] [place] pager [phone] office 07/04/2018   [place] 07/04/2018, 4:25 PM

## 2018-07-04 NOTE — Progress Notes (Signed)
Discussed path report with pt. He is trying to get nursing help with getting a BM before he goes to radiation treatment. I am sure Dr. Marin Olp will discuss results with patient and with more knowledge about course forward for treatment.

## 2018-07-04 NOTE — Progress Notes (Signed)
PROGRESS NOTE    Lucas Morales  FYB:[id number] DOB: June 26, 1933 DOA: 06/21/2018 PCP: Deland Pretty, MD    Brief Narrative:   Lucas Morales is a 85 y.o. malewith medical history significant of adenomatous polyps, alpha-1 antitrypsin deficiency carrier, anemia, anxiety, depression, osteoarthritis of the knees and back, easy bruising, chronic bronchitis, chronic lower back pain, CAD, history of frequent diarrhea due to partial colectomy, diverticulosis, episodes of diverticulitis, hemorrhoids, gastritis, hiatal hernia, GERD, esophageal stricture, esophageal dysmotility, history of post CABG paroxysmal A. fib, history of bradycardia, hyperlipidemia, hypertension, OSA not on CPAP due to intolerance, restless leg syndrome, sciatic nerve pain, type 2 diabetes, urinary frequency whohad lumbar decompression L3-4 on 05/10/2018 and was admitted about a week ago due to suspected L3 vertebral body osteomyelitis. However, after reviewing all imaging and getting new imaging studies, it was determined that the patient had a metastatic lesion. 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 He was seen by oncology 3 days ago. He underwent biopsy of the area with interventional radiology. We are seeinghimconsultdue to AKI, worsening LFTs and multiple medical issues.   Developed AKI: Suspect multifactorial secondary to contrast and dehydration. Patient was transferred from [facility] to [facility] on 4/14 to start radiation treatment to pelvis mass.   Assessment & Plan:   Principal Problem:   Pelvic mass Active Problems:   Essential hypertension   Dyslipidemia   PAF (paroxysmal atrial fibrillation) (HCC)   OSA (obstructive sleep apnea)   Deep venous thrombosis (HCC)   Cancer associated pain   DNR (do not resuscitate)   Type II diabetes mellitus ([place])   Coronary artery disease   AKI (acute kidney injury) ([place])   Metastatic cancer (HCC)   Lung nodules   Bladder spasm   Pelvic mass  Multiple liver masses Multiple lung nodules Patient initially admitted following lumbar decompression L3-4 on 05/10/2018 with suspected L3 osteomyelitis.  CT pelvis notable for a 13 x 10 x 10 cm mass arising from the right ischium extending into the posterior aspect of the right acetabulum and adjacent soft tissues. --Medical and radiation oncology following, appreciate assistance --s/p CT guided biopsy lytic right pelvic bone lesion on 06/27/2018 by IR --AFP 39.7, CEA 2.2, PSA 2.08 --Pathology notable for high-grade carcinoma with unknown primary origin --Pending further molecular studies per oncology --Differential for primary lesion include lung primary vs pelvic sarcoma vs cholangiocarcinoma --MR brain negative for metastatic process --Oncology believes this is an extremely aggressive tumor with prognosis 2-3 months --Continue pain control with fentanyl patch, Dilaudid 4 mg every 4 PO prn, and Decadron 8 mg p.o. daily; palliative care assisting with pain management --starting radiation treatments today, plan for 10 fractions; Last day 07/17/2018 --Continue treatment dose Lovenox for hypercoagulable state given malignancy  Acute renal failure Creatinine elevated to a high of 1.94 during hospitalization.  Etiology likely secondary to decreased oral intake and contrast exposure from CT abdomen/pelvis as well as urinary retention.  Renal ultrasound unrevealing. --Continue Foley catheter --Creatinine improving 1.94-->1.33-->1.16 today (0.96 on admission) --Avoid nephrotoxins, renally dose all medications --Continue to monitor renal function daily  Acute urinary retention Nausea likely secondary to edema and pelvic mass with compression. --Continue tamsulosin 0.4 mg p.o. daily --Continue Foley catheter  L3 lesion:  Likely metastatic disease and not consistent with infection, infectious disease initially consulted during hospitalization, antibiotics have subsequently discontinued.   Hypertension: Resume metoprolol.  HLD: holding due to transaminases.  Paroxysmal A. Fib: continue metoprolol succinate 50 mg p.o. daily  OSA: Not on  CPAP.  Continue with oxygen supplementation prn  Acute DVT, right soleal vein:  US duplex doppler with findings consistent with acute deep vein thrombosis involving the right soleal vein. --Continue with treatment dose Lovenox.   Cancer associated pain:  Palliative care following, appreciate assistance. --Continue fentanyl patch, Dilaudid 4 mg p.o. every 4 hours, Decadron --Dilaudid 1 mg IV every 4 hours as needed severe pain  Type 2 diabetes:  --Lantus 10 units [place] qHS --Sliding scale insulin.   --Monitor glucose closely while on Decadron  Coronary artery disease: New beta-blocker  Transaminases: Suspect related to liver masses , stable.   Hyponatremia: Improved with IV fluids.  Hyperkalemia;  --Received dose of lokelma.  --Renal diet --Continue to monitor electrolytes closely  Metabolic acidosis --continue sodium bicarb 1300mg  PO BID   DVT prophylaxis: Lovenox 100 mg SQ every 12 hours Code Status: Full code Family Communication: Discussed with daughter, [name] over the telephone today Disposition Plan: New inpatient, PT recommends SNF versus home health; will likely remain inpatient for radiation treatments   Consultants:   Infectious disease  Radiation oncology  Medical oncology  Interventional radiology  Orthopedics  Procedures:  CT guided biopsy lytic right pelvic bone lesion on 06/27/2018 by IR  Antimicrobials:   Vancomycin 4/3 -4/6  Zosyn 4/3 - 4/4  Ceftriaxone 4/4 - 4/6   Subjective: Patient seen and examined at bedside, continues with mild pain and significant right lower extremity/scrotal swelling.  Awaiting radiation treatment today.  Discussed with him today regarding remaining inpatient for radiation treatments versus going home; patient wishes to remain inpatient at this time  secondary to his debility/weakness.  No other complaints at this time.  Denies headache, no visual changes, no chest pain, no palpitations, no shortness of breath, no abdominal pain, no issues with bowel function, no paresthesias.  No acute events overnight per nursing staff.  Objective: Vitals:   06/27/18 1230 06/27/18 1247 06/29/18 0435 07/02/18 0745  BP: (!) 172/92 (!) 154/83 134/80 (!) 149/80  Pulse: 96 100 88 82  Resp: 18 18 18 16   Temp:  98.4 F (36.9 C) 98.2 F (36.8 C)   TempSrc:  Oral Oral   SpO2: 98% 93% 92% 98%  Weight:      Height:        Intake/Output Summary (Last 24 hours) at 07/04/2018 1114 Last data filed at 07/04/2018 1287 Gross per 24 hour  Intake 1261.51 ml  Output 1600 ml  Net -338.49 ml   Filed Weights   06/21/18 1652  Weight: 101.3 kg    Examination:  General exam: Appears calm and comfortable  Respiratory system: Clear to auscultation. Respiratory effort normal. Cardiovascular system: S1 & S2 heard, RRR. No JVD, murmurs, rubs, gallops or clicks.  2+ bilateral pedal edema Gastrointestinal system: Abdomen is nondistended, soft and nontender. No organomegaly or masses felt. Normal bowel sounds heard. Genitourinary: Significant scrotal edema noted, Foley catheter in place Central nervous system: Alert and oriented. No focal neurological deficits. Extremities: Symmetric 5 x 5 power. Skin: No rashes, lesions or ulcers Psychiatry: Judgement and insight appear normal. Mood & affect appropriate.     Data Reviewed: I have personally reviewed following labs and imaging studies  CBC: Recent Labs  Lab 06/28/18 0444 06/29/18 0500 06/30/18 0257 07/01/18 0318 07/02/18 0420  WBC 8.3 11.6* 6.4 7.9 9.3  NEUTROABS 7.2 11.4* 5.4 6.4  --   HGB 11.4* 10.7* 10.4* 11.0* 11.0*  HCT 34.5* 32.5* 31.6* 33.2* 33.5*  MCV 95.6 99.4 98.8 98.8 98.8  PLT  275 246 210 240 706   Basic Metabolic Panel: Recent Labs  Lab 06/29/18 0500 06/30/18 0257 07/01/18 0318 07/02/18  0420 07/04/18 0802  NA 131* 132* 135 136 137  K 5.2* 5.3* 5.1 4.7 4.5  CL 101 101 103 105 108  CO2 17* 19* 19* 21* 21*  GLUCOSE 176* 166* 149* 131* 134*  BUN 78* 74* 68* 54* 42*  CREATININE 1.75* 1.59* 1.45* 1.33* 1.16  CALCIUM 8.4* 8.2* 8.1* 7.9* 7.3*  MG  --   --   --  2.6*  --    GFR: Estimated Creatinine Clearance: 62.4 mL/min (by C-G formula based on SCr of 1.16 mg/dL). Liver Function Tests: Recent Labs  Lab 06/28/18 0444 06/29/18 0500 06/30/18 0257 07/01/18 0318 07/04/18 0802  AST 37 44* 56* 59* 51*  ALT 41 43 55* 68* 88*  ALKPHOS 472* 415* 399* 443* 459*  BILITOT 0.9 0.6 0.5 1.0 1.0  PROT 5.9* 5.4* 5.5* 5.8* 6.0*  ALBUMIN 2.7* 2.6* 2.6* 2.8* 2.9*   No results for input(s): LIPASE, AMYLASE in the last 168 hours. No results for input(s): AMMONIA in the last 168 hours. Coagulation Profile: No results for input(s): INR, PROTIME in the last 168 hours. Cardiac Enzymes: Recent Labs  Lab 06/28/18 1025  CKTOTAL 30*   BNP (last 3 results) No results for input(s): PROBNP in the last 8760 hours. HbA1C: No results for input(s): HGBA1C in the last 72 hours. CBG: Recent Labs  Lab 07/02/18 1129 07/02/18 1704 07/02/18 2009 07/03/18 0752 07/03/18 1154  GLUCAP 128* 146* 148* 120* 130*   Lipid Profile: No results for input(s): CHOL, HDL, LDLCALC, TRIG, CHOLHDL, LDLDIRECT in the last 72 hours. Thyroid Function Tests: No results for input(s): TSH, T4TOTAL, FREET4, T3FREE, THYROIDAB in the last 72 hours. Anemia Panel: No results for input(s): VITAMINB12, FOLATE, FERRITIN, TIBC, IRON, RETICCTPCT in the last 72 hours. Sepsis Labs: No results for input(s): PROCALCITON, LATICACIDVEN in the last 168 hours.  No results found for this or any previous visit (from the past 240 hour(s)).       Radiology Studies: Mr Jeri Cos Wo Contrast  Result Date: 07/03/2018 CLINICAL DATA:  Neuroendocrine tumors of unknown primary, biopsy-proven, initial follow-up. Pelvic mass. EXAM:  MRI HEAD WITHOUT AND WITH CONTRAST TECHNIQUE: Multiplanar, multiecho pulse sequences of the brain and surrounding structures were obtained without and with intravenous contrast. CONTRAST:  10 mL Gadavist COMPARISON:  None FINDINGS: Brain: No acute infarct, hemorrhage, or mass lesion is present. Postcontrast images demonstrate no pathologic enhancement. There is no evidence for metastatic disease to the brain. Mild atrophy is within normal limits for age. There is no significant white matter disease. The ventricles are of proportionate to the degree of atrophy. No significant extraaxial fluid collection is present. The internal auditory canals are within normal limits bilaterally. The brainstem and cerebellum are within normal limits. Vascular: Flow is present in the major intracranial arteries. Skull and upper cervical spine: The craniocervical junction is normal. Upper cervical spine is within normal limits. Marrow signal is unremarkable. Sinuses/Orbits: The paranasal sinuses and mastoid air cells are clear. Bilateral lens replacements are noted. Globes and orbits are otherwise unremarkable. IMPRESSION: 1. No evidence for metastatic disease to the brain or meninges. 2. Normal MRI appearance of the brain for age. Electronically Signed   By: San Morelle M.D.   On: 07/03/2018 11:36        Scheduled Meds: . dexamethasone  8 mg Oral Daily  . enoxaparin (LOVENOX) injection  1 mg/kg Subcutaneous  Q12H  . feeding supplement (ENSURE ENLIVE)  237 mL Oral BID BM  . feeding supplement (PRO-STAT SUGAR FREE 64)  30 mL Oral BID WC  . fentaNYL  1 patch Transdermal Q72H  . fluconazole  100 mg Oral Daily  . insulin aspart  0-15 Units Subcutaneous TID WC  . insulin glargine  10 Units Subcutaneous QHS  . lactulose  20 g Oral Daily  . metoprolol succinate  50 mg Oral Daily  . multivitamin with minerals  1 tablet Oral Daily  . pantoprazole  40 mg Oral BID  . phenazopyridine  100 mg Oral TID WC  .  polyethylene glycol  17 g Oral Daily  . senna  2 tablet Oral QHS  . sodium bicarbonate  1,300 mg Oral BID  . sodium chloride flush  10-40 mL Intracatheter Q12H  . tamsulosin  0.4 mg Oral Daily   Continuous Infusions: . sodium chloride 45 mL/hr at 07/04/18 0600     LOS: 13 days    Time spent: 31 minutes    Eric J [place] DO [facility] Pager [phone]  If 7PM-7AM, please contact night-coverage [email or web address] Password [email or web address] 07/04/2018, 11:14 AM

## 2018-07-04 NOTE — Progress Notes (Signed)
I spoke with Lucas Morales and Lucas Morales.  They would like for Lucas Morales to come home (as [facility] shifts to a COVID hospital).  They would prefer that he not be transferred back and forth from [facility] for Radiation.  They want the extra time with him as they are concerned he may only have months.  Lucas Morales responded positively to their requests for him to come home.  I believe he may rather go home than be transferred back to [facility] as well.  Per Lucas Morales his pain is controlled and the nursing care here has been very good.  He is forcing himself to eat.  He had a bowel movement today.  I met Lucas (Vedder's son) and per Lucas Morales's request gave him Lucas Morales's [employer] and bill fold.  I will call Lucas Morales later and update her on her father's condition.  PE:  Awake, Alert, NAD, coherent, Appropriate.   Recommendations:  Per RN request I will add PRN advil and Tylenol.  I will increase his fentanyl patch and decrease his IV dilaudid dose.  PMT will continue to follow supportively.  Lucas Jenny, PA-C Palliative Medicine Pager: [phone]  Time 60 min.

## 2018-07-05 ENCOUNTER — Ambulatory Visit: Payer: Medicare Other

## 2018-07-05 ENCOUNTER — Ambulatory Visit
Admit: 2018-07-05 | Discharge: 2018-07-05 | Disposition: A | Discharge status: Home / Self Care | Payer: Medicare Other | Attending: Radiation Oncology | Admitting: Radiation Oncology

## 2018-07-05 DIAGNOSIS — Z515 Encounter for palliative care: Secondary | ICD-10-CM

## 2018-07-05 DIAGNOSIS — R1900 Intra-abdominal and pelvic swelling, mass and lump, unspecified site: Secondary | ICD-10-CM

## 2018-07-05 LAB — CBC
HCT: 35.7 % — ABNORMAL LOW (ref 39.0–52.0)
Hemoglobin: 11.4 g/dL — ABNORMAL LOW (ref 13.0–17.0)
MCH: 32.5 pg (ref 26.0–34.0)
MCHC: 31.9 g/dL (ref 30.0–36.0)
MCV: 101.7 fL — ABNORMAL HIGH (ref 80.0–100.0)
Platelets: 247 10*3/uL (ref 150–400)
RBC: 3.51 MIL/uL — ABNORMAL LOW (ref 4.22–5.81)
RDW: 15.9 % — ABNORMAL HIGH (ref 11.5–15.5)
WBC: 10.2 10*3/uL (ref 4.0–10.5)
nRBC: 0 % (ref 0.0–0.2)

## 2018-07-05 LAB — GLUCOSE, CAPILLARY
Glucose-Capillary: 93 mg/dL (ref 70–99)
Glucose-Capillary: 128 mg/dL — ABNORMAL HIGH (ref 70–99)
Glucose-Capillary: 252 mg/dL — ABNORMAL HIGH (ref 70–99)
Glucose-Capillary: 150 mg/dL — ABNORMAL HIGH (ref 70–99)

## 2018-07-05 LAB — LACTATE DEHYDROGENASE: LDH: 236 U/L — ABNORMAL HIGH (ref 98–192)

## 2018-07-05 MED ORDER — HYDROMORPHONE HCL 2 MG PO TABS
4.0000 mg | ORAL_TABLET | Freq: Three times a day (TID) | ORAL | Status: DC
  Administered 2018-07-05 – 2018-07-12 (×20): 4 mg via ORAL
  Filled 2018-07-05 (×20): qty 2

## 2018-07-05 MED ORDER — HYDROMORPHONE HCL 1 MG/ML IJ SOLN
0.5000 mg | INTRAMUSCULAR | Status: DC | PRN
  Administered 2018-07-05: 1 mg via INTRAVENOUS
  Administered 2018-07-06: 0.5 mg via INTRAVENOUS
  Administered 2018-07-06 – 2018-07-11 (×19): 1 mg via INTRAVENOUS
  Filled 2018-07-05 (×22): qty 1

## 2018-07-05 MED ORDER — HYDROMORPHONE HCL 1 MG/ML IJ SOLN: 0.5000 mg | INTRAMUSCULAR | Status: DC | PRN

## 2018-07-05 MED ORDER — ENOXAPARIN SODIUM 120 MG/0.8ML ~~LOC~~ SOLN
1.0000 mg/kg | Freq: Two times a day (BID) | SUBCUTANEOUS | Status: DC
  Administered 2018-07-05 – 2018-07-10 (×10): 115 mg via SUBCUTANEOUS
  Filled 2018-07-05 (×10): qty 0.75

## 2018-07-05 MED ORDER — HYDROMORPHONE HCL 2 MG PO TABS: 4.0000 mg | ORAL_TABLET | Freq: Four times a day (QID) | ORAL | Status: DC

## 2018-07-05 NOTE — Progress Notes (Signed)
PROGRESS NOTE    Lucas Morales  AJG:[id number] DOB: 09/14/1933 DOA: 06/21/2018 PCP: Lucas Pretty, MD    Brief Narrative:   Lucas Morales is a 85 y.o. malewith medical history significant of adenomatous polyps, alpha-1 antitrypsin deficiency carrier, anemia, anxiety, depression, osteoarthritis of the knees and back, easy bruising, chronic bronchitis, chronic lower back pain, CAD, history of frequent diarrhea due to partial colectomy, diverticulosis, episodes of diverticulitis, hemorrhoids, gastritis, hiatal hernia, GERD, esophageal stricture, esophageal dysmotility, history of post CABG paroxysmal A. fib, history of bradycardia, hyperlipidemia, hypertension, OSA not on CPAP due to intolerance, restless leg syndrome, sciatic nerve pain, type 2 diabetes, urinary frequency whohad lumbar decompression L3-4 on 05/10/2018 and was admitted about a week ago due to suspected L3 vertebral body osteomyelitis. However, after reviewing all imaging and getting new imaging studies, it was determined that the patient had a metastatic lesion. 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 He was seen by oncology 3 days ago. He underwent biopsy of the area with interventional radiology. We are seeinghimconsultdue to AKI, worsening LFTs and multiple medical issues.   Developed AKI: Suspect multifactorial secondary to contrast and dehydration. Patient was [facility] on 4/14 to start radiation treatment to pelvis mass.   Assessment & Plan:   Principal Problem:   Pelvic mass Active Problems:   Essential hypertension   Dyslipidemia   PAF (paroxysmal atrial fibrillation) (HCC)   OSA (obstructive sleep apnea)   Deep venous thrombosis (HCC)   Cancer associated pain   DNR (do not resuscitate)   Type II diabetes mellitus ([place])   Coronary artery disease   AKI (acute kidney injury) ([place])   Metastatic cancer (HCC)   Lung nodules   Bladder spasm   Pelvic mass  Multiple liver masses Multiple lung nodules Patient initially admitted following lumbar decompression L3-4 on 05/10/2018 with suspected L3 osteomyelitis.  CT pelvis notable for a 13 x 10 x 10 cm mass arising from the right ischium extending into the posterior aspect of the right acetabulum and adjacent soft tissues. --Medical and radiation oncology following, appreciate assistance --s/p CT guided biopsy lytic right pelvic bone lesion on 06/27/2018 by IR --AFP 39.7, CEA 2.2, PSA 2.08 --Pathology notable for high-grade carcinoma with unknown primary origin --Pending further molecular studies per oncology --Differential for primary lesion include lung primary vs pelvic sarcoma vs cholangiocarcinoma --MR brain negative for metastatic process --Oncology believes this is an extremely aggressive tumor with prognosis 2-3 months --continue XRT; completed #1/10 fractions; Last day planned 07/17/2018 --Continue treatment dose Lovenox for hypercoagulable state given malignancy  Pain of malignancy --Continue pain control with fentanyl patch 44mcg --Dilaudid 4 mg every 4 PO prn, and Decadron 8 mg p.o. daily --Dilaudid IV 0.5-1 mg prn for breakthrough pain --palliative care assisting with pain management  Acute renal failure Creatinine elevated to a high of 1.94 during hospitalization.  Etiology likely secondary to decreased oral intake and contrast exposure from CT abdomen/pelvis as well as urinary retention.  Renal ultrasound unrevealing. --Continue Foley catheter --Creatinine improving 1.94-->1.33-->1.16 (0.96 on admission) --Avoid nephrotoxins, renally dose all medications --Continue to monitor renal function daily  Acute urinary retention Nausea likely secondary to edema and pelvic mass with compression. --Continue tamsulosin 0.4 mg p.o. daily --Continue Foley catheter  L3 lesion:  Likely metastatic disease and not consistent with infection, infectious disease initially consulted during  hospitalization, antibiotics have subsequently discontinued.  Hypertension: Resume metoprolol.  HLD: holding due to transaminases.  Paroxysmal A. Fib: continue  metoprolol succinate 50 mg p.o. daily  OSA: Not on CPAP.  Continue with oxygen supplementation prn  Acute DVT, right soleal vein:  US duplex doppler with findings consistent with acute deep vein thrombosis involving the right soleal vein. --Continue with treatment dose Lovenox.   Cancer associated pain:  Palliative care following, appreciate assistance. --Continue fentanyl patch, Dilaudid 4 mg p.o. every 4 hours, Decadron --Dilaudid 1 mg IV every 4 hours as needed severe pain  Type 2 diabetes:  --Lantus 10 units [place] qHS --Sliding scale insulin.   --Monitor glucose closely while on Decadron  Coronary artery disease: continue beta-blocker  Transaminases: Suspect related to liver masses , stable.   Hyponatremia: Improved with IV fluids.  Hyperkalemia;  --Received dose of lokelma.  --Renal diet --Continue to monitor electrolytes closely  Metabolic acidosis --continue sodium bicarb 1300mg  PO BID   DVT prophylaxis: Lovenox 100 mg SQ every 12 hours Code Status: Full code Family Communication: Discussed with daughter, Lucas Morales over the telephone today Disposition Plan: continue inpatient, PT recommends SNF versus home health; will likely remain inpatient for radiation treatments with planned completion on 07/17/2018.   Consultants:   Infectious disease  Radiation oncology  Medical oncology  Interventional radiology  Orthopedics  Procedures:  CT guided biopsy lytic right pelvic bone lesion on 06/27/2018 by IR  Antimicrobials:   Vancomycin 4/3 -4/6  Zosyn 4/3 - 4/4  Ceftriaxone 4/4 - 4/6   Subjective: Patient seen and examined at bedside, resting comfortably.  States pain is controlled.  Discussed with him once again regarding possible discharge home to continue radiation therapy, he wishes to  remain inpatient as he has severe debility and unable to care for himself at this time.  Awaiting second fraction of radiation today.  No other complaints at this time.  Denies headache, no visual changes, no chest pain, no palpitations, no shortness of breath, no abdominal pain, no issues with bowel function, no paresthesias.  No acute events overnight per nursing staff.  Objective: Vitals:   06/27/18 1247 06/29/18 0435 07/02/18 0745 07/04/18 1246  BP: (!) 154/83 134/80 (!) 149/80   Pulse: 100 88 82   Resp: 18 18 16    Temp: 98.4 F (36.9 C) 98.2 F (36.8 C)    TempSrc: Oral Oral    SpO2: 93% 92% 98%   Weight:    114.7 kg  Height:        Intake/Output Summary (Last 24 hours) at 07/05/2018 0944 Last data filed at 07/05/2018 0559 Gross per 24 hour  Intake -  Output 1650 ml  Net -1650 ml   Filed Weights   06/21/18 1652 07/04/18 1246  Weight: 101.3 kg 114.7 kg    Examination:  General exam: Appears calm and comfortable  Respiratory system: Clear to auscultation. Respiratory effort normal. Cardiovascular system: S1 & S2 heard, RRR. No JVD, murmurs, rubs, gallops or clicks.  2+ bilateral pedal edema Gastrointestinal system: Abdomen is nondistended, soft and nontender. No organomegaly or masses felt. Normal bowel sounds heard. Genitourinary: Significant scrotal edema noted, Foley catheter in place Central nervous system: Alert and oriented. No focal neurological deficits. Extremities: Symmetric 5 x 5 power. Skin: No rashes, lesions or ulcers Psychiatry: Judgement and insight appear normal. Mood & affect appropriate.     Data Reviewed: I have personally reviewed following labs and imaging studies  CBC: Recent Labs  Lab 06/29/18 0500 06/30/18 0257 07/01/18 0318 07/02/18 0420 07/05/18 0432  WBC 11.6* 6.4 7.9 9.3 10.2  NEUTROABS 11.4* 5.4 6.4  --   --  HGB 10.7* 10.4* 11.0* 11.0* 11.4*  HCT 32.5* 31.6* 33.2* 33.5* 35.7*  MCV 99.4 98.8 98.8 98.8 101.7*  PLT 246 210 240  254 329   Basic Metabolic Panel: Recent Labs  Lab 06/29/18 0500 06/30/18 0257 07/01/18 0318 07/02/18 0420 07/04/18 0802  NA 131* 132* 135 136 137  K 5.2* 5.3* 5.1 4.7 4.5  CL 101 101 103 105 108  CO2 17* 19* 19* 21* 21*  GLUCOSE 176* 166* 149* 131* 134*  BUN 78* 74* 68* 54* 42*  CREATININE 1.75* 1.59* 1.45* 1.33* 1.16  CALCIUM 8.4* 8.2* 8.1* 7.9* 7.3*  MG  --   --   --  2.6*  --    GFR: Estimated Creatinine Clearance: 66.1 mL/min (by C-G formula based on SCr of 1.16 mg/dL). Liver Function Tests: Recent Labs  Lab 06/29/18 0500 06/30/18 0257 07/01/18 0318 07/04/18 0802  AST 44* 56* 59* 51*  ALT 43 55* 68* 88*  ALKPHOS 415* 399* 443* 459*  BILITOT 0.6 0.5 1.0 1.0  PROT 5.4* 5.5* 5.8* 6.0*  ALBUMIN 2.6* 2.6* 2.8* 2.9*   No results for input(s): LIPASE, AMYLASE in the last 168 hours. No results for input(s): AMMONIA in the last 168 hours. Coagulation Profile: No results for input(s): INR, PROTIME in the last 168 hours. Cardiac Enzymes: Recent Labs  Lab 06/28/18 1025  CKTOTAL 30*   BNP (last 3 results) No results for input(s): PROBNP in the last 8760 hours. HbA1C: No results for input(s): HGBA1C in the last 72 hours. CBG: Recent Labs  Lab 07/04/18 0740 07/04/18 1135 07/04/18 1733 07/04/18 2045 07/05/18 0809  GLUCAP 103* 132* 169* 143* 93   Lipid Profile: No results for input(s): CHOL, HDL, LDLCALC, TRIG, CHOLHDL, LDLDIRECT in the last 72 hours. Thyroid Function Tests: No results for input(s): TSH, T4TOTAL, FREET4, T3FREE, THYROIDAB in the last 72 hours. Anemia Panel: No results for input(s): VITAMINB12, FOLATE, FERRITIN, TIBC, IRON, RETICCTPCT in the last 72 hours. Sepsis Labs: No results for input(s): PROCALCITON, LATICACIDVEN in the last 168 hours.  No results found for this or any previous visit (from the past 240 hour(s)).       Radiology Studies: Mr Jeri Cos Wo Contrast  Result Date: 07/03/2018 CLINICAL DATA:  Neuroendocrine tumors of  unknown primary, biopsy-proven, initial follow-up. Pelvic mass. EXAM: MRI HEAD WITHOUT AND WITH CONTRAST TECHNIQUE: Multiplanar, multiecho pulse sequences of the brain and surrounding structures were obtained without and with intravenous contrast. CONTRAST:  10 mL Gadavist COMPARISON:  None FINDINGS: Brain: No acute infarct, hemorrhage, or mass lesion is present. Postcontrast images demonstrate no pathologic enhancement. There is no evidence for metastatic disease to the brain. Mild atrophy is within normal limits for age. There is no significant white matter disease. The ventricles are of proportionate to the degree of atrophy. No significant extraaxial fluid collection is present. The internal auditory canals are within normal limits bilaterally. The brainstem and cerebellum are within normal limits. Vascular: Flow is present in the major intracranial arteries. Skull and upper cervical spine: The craniocervical junction is normal. Upper cervical spine is within normal limits. Marrow signal is unremarkable. Sinuses/Orbits: The paranasal sinuses and mastoid air cells are clear. Bilateral lens replacements are noted. Globes and orbits are otherwise unremarkable. IMPRESSION: 1. No evidence for metastatic disease to the brain or meninges. 2. Normal MRI appearance of the brain for age. Electronically Signed   By: San Morelle M.D.   On: 07/03/2018 11:36        Scheduled Meds: . dexamethasone  8 mg Oral Daily  . enoxaparin (LOVENOX) injection  1 mg/kg Subcutaneous Q12H  . feeding supplement (ENSURE ENLIVE)  237 mL Oral BID BM  . feeding supplement (PRO-STAT SUGAR FREE 64)  30 mL Oral BID WC  . fentaNYL  1 patch Transdermal Q72H  . insulin aspart  0-15 Units Subcutaneous TID WC  . insulin glargine  10 Units Subcutaneous QHS  . lactulose  20 g Oral Daily  . metoprolol succinate  50 mg Oral Daily  . multivitamin with minerals  1 tablet Oral Daily  . pantoprazole  40 mg Oral BID  . phenazopyridine   100 mg Oral TID WC  . polyethylene glycol  17 g Oral Daily  . senna  2 tablet Oral QHS  . sodium bicarbonate  1,300 mg Oral BID  . sodium chloride flush  10-40 mL Intracatheter Q12H  . tamsulosin  0.4 mg Oral Daily   Continuous Infusions: . sodium chloride 45 mL/hr at 07/05/18 0212     LOS: 14 days    Time spent: 28 minutes    Eric J [place] DO [facility] Pager [phone]  If 7PM-7AM, please contact night-coverage [email or web address] Password [facility] 07/05/2018, 9:44 AM

## 2018-07-05 NOTE — Progress Notes (Signed)
Occupational Therapy Treatment Patient Details Name: Lucas Morales MRN: [id number] DOB: 15-Mar-1934 Today's Date: 07/05/2018    History of present illness This is an 85 year old male with a past medical history including hypertension, depression and anxiety, coronary artery disease, chronic interstitial cystitis, previous left hip fracture, previous cervical fusion, hyperlipidemia, restless leg syndrome, diabetes, arthritis, alpha 1 antitrypsin deficiency carrier, anemia, chronic back pain who was admitted for suspected postoperative lumbar decompression at L3-4 infection.  Surgery date was 05/10/2018. MRI suspicious for osteomyelitis. CT scan revealed R medial acetabular fx as well as lesions on liver suspicious for cancer.    OT comments  Only tolerated new sling being applied this pm after XRT.  RN present to see how it is applied. Will continue to follow   Follow Up Recommendations  SNF;Supervision/Assistance - 24 hour    Equipment Recommendations  3 in 1 bedside commode    Recommendations for Other Services      Precautions / Restrictions Precautions Precautions: Fall Restrictions Weight Bearing Restrictions: No RLE Weight Bearing: Weight bearing as tolerated       Mobility Bed Mobility                  Transfers                      Balance                                           ADL either performed or assessed with clinical judgement   ADL                                         General ADL Comments: pt back from XRT and wanted nursing to reapply sling.  Brought new material and RN worked with me to see application.  brought pt's trunk forward slightly to cross straps under scapula as yesterday, rolling was very painful.  Had to readjust to supine to adjust strap length.  Positioned with washcloths under scrotom.  Pt verbalized comfort.  Washed old sling and hung in bathroom to dry with note to not dispose of  it.       Vision       Perception     Praxis      Cognition Arousal/Alertness: Awake/alert Behavior During Therapy: WFL for tasks assessed/performed Overall Cognitive Status: Within Functional Limits for tasks assessed                                          Exercises     Shoulder Instructions       General Comments      Pertinent Vitals/ Pain       Pain Assessment: Faces Faces Pain Scale: Hurts even more Pain Location: back, R hip and scrotum  Pain Descriptors / Indicators: Grimacing Pain Intervention(s): Limited activity within patient's tolerance;Monitored during session;Repositioned  Home Living                                          Prior Functioning/Environment  Frequency  Min 3X/week        Progress Toward Goals  OT Goals(current goals can now be found in the care plan section)  Progress towards OT goals: (only tolerated sling this pm)     Plan      Co-evaluation                 AM-PAC OT "6 Clicks" Daily Activity     Outcome Measure   Help from another person eating meals?: None Help from another person taking care of personal grooming?: A Little Help from another person toileting, which includes using toliet, bedpan, or urinal?: A Lot Help from another person bathing (including washing, rinsing, drying)?: A Lot Help from another person to put on and taking off regular upper body clothing?: A Little Help from another person to put on and taking off regular lower body clothing?: A Lot 6 Click Score: 16    End of Session    OT Visit Diagnosis: Unsteadiness on feet (R26.81);Other abnormalities of gait and mobility (R26.89);Muscle weakness (generalized) (M62.81)   Activity Tolerance     Patient Left     Nurse Communication          Time: 5597-4163 OT Time Calculation (min): 25 min  Charges: OT General Charges $OT Visit: 1 Visit OT Treatments $Therapeutic Activity:  8-22 mins  Lesle Chris, OTR/L Acute Rehabilitation Services [phone] [place] pager [phone] office 07/05/2018   [place] 07/05/2018, 2:03 PM

## 2018-07-05 NOTE — Progress Notes (Signed)
PT Cancellation Note  Patient Details Name: Lucas Morales MRN: [id number] DOB: Jan 22, 1934   Cancelled Treatment:    Reason Eval/Treat Not Completed: Pain limiting ability to participate Pt with increased pain and fatigue after radiation and OT.  Declines PT at this time.  RN notified of pain.   Puja Caffey,KATHrine E 07/05/2018, 2:48 PM Carmelia Bake, PT, DPT Acute Rehabilitation Services Office: [phone] Pager: [phone]

## 2018-07-05 NOTE — Progress Notes (Addendum)
[place] for Enoxaparin Indication: Blood clots/IVC thrombus and DVT  Allergies  Allergen Reactions  . Morphine Itching    Patient Measurements: Height: 6\' 2"  (188 cm) Weight: 252 lb 13.9 oz (114.7 kg) IBW/kg (Calculated) : 82.2 Heparin Dosing Weight: 101kg  Vital Signs: Temp: 97.5 F (36.4 C) (04/17 0522) Temp Source: Oral (04/17 0522) BP: 121/75 (04/17 0522) Pulse Rate: 87 (04/17 0522)  Labs: Recent Labs    07/04/18 0802 07/05/18 0432  HGB  --  11.4*  HCT  --  35.7*  PLT  --  247  CREATININE 1.16  --     Estimated Creatinine Clearance: 66.1 mL/min (by C-G formula based on SCr of 1.16 mg/dL).   Assessment: 85 yo male with recent back surgery now found to have high-grade carcinoma with unknown primary origin. 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 Also has DVT. Transitioned to Lovenox post biopsy.   Patient noted to have AKI suspected due to contrast and dehydration.  Today, 07/05/2018  SCr trending down to 1.16 on 4/16, CrCl ~65 ml/min. Good UOP recorded.   H/H remains stable. Platelets are within normal limits.   No bleeding reported.   Weight has fluctuated: 101kg on 4/3, 114.7kg on 4/16 and 112kg today 4/17 - edematous per RN  Goal of Therapy:  Therapeutic anticoagulation Monitor platelets by anticoagulation protocol: Yes   Plan:  Adjust Lovenox 115 mg subq q12hr CBC at least q72h Monitor weight and adjust dose needed Monitor for bleeding complications and platelet count.  Thanks for allowing pharmacy to be a part of this patient's care.  Peggyann Juba, PharmD, BCPS Pager: [phone] 07/05/2018, 11:15 AM

## 2018-07-05 NOTE — Progress Notes (Addendum)
Daily Progress Note   Patient Name: Lucas Morales       Date: 1/61/0960 DOB: Mar 15, 1934  Age: 85 y.o. MRN#: [id number] Attending Physician: [place] Eric J, DO Primary Care Physician: Deland Pretty, MD Admit Date: 06/21/2018  Reason for Consultation/Follow-up: Non pain symptom management, Pain control and Psychosocial/spiritual support  Subjective: Complains about having diarrhea bowel movements in bed.  Pain is controlled except for when he had a bad bowel mvmt he had stabbiing pain in the tip of his penis again but it resolved.  He has not been out of bed today.  He is not eating well at all.  He is very concerned about getting his Last Will and Testament notarized.  He is tremulous as he asks me for help getting it notarized. He is concerned he will die before getting it notarized.    Assessment: In an approximate 24 period (1:00 am 4/16 - 2:00 am 4/17)  patient required 4 mg IV dilaudid, 4 mg oral dilaudid, and was on fentanyl 12.5 TD   (60 + 20 +30) this equates to an estimated 110 oral morphine equivalents.     Patient Profile/HPI:   85 y.o.malewith past medical history of CAD, Afib, alpha 1 antitrypsin deficiency, interstitial cystitis (with a tendency to have urinary retention), DM, depression and anxietywho was admitted on 4/3/2020with what was thought to be a post op infection at L3-L4. Work up has revealed widespread (lungs, liver, pelvis, bone) metastatic cancer of undetermined primary.Lucas Morales) underwent biopsy that indicated high grade carcinoma of uncertain primary.  More studies are being done to attempt to determine the primary.  In the mean time Lucas Morales is undergoing 10 radiation treatments for pain control. The first xrt was 4/16.  Length of Stay: 14   Current Medications: Scheduled Meds:  . dexamethasone  8 mg Oral Daily  . enoxaparin (LOVENOX) injection  1 mg/kg Subcutaneous Q12H  . feeding supplement (ENSURE ENLIVE)  237 mL Oral BID BM  . feeding supplement (PRO-STAT SUGAR FREE 64)  30 mL Oral BID WC  . fentaNYL  1 patch Transdermal Q72H  . insulin aspart  0-15 Units Subcutaneous TID WC  . insulin glargine  10 Units Subcutaneous QHS  . lactulose  20 g Oral Daily  . metoprolol succinate  50 mg Oral Daily  .  multivitamin with minerals  1 tablet Oral Daily  . pantoprazole  40 mg Oral BID  . phenazopyridine  100 mg Oral TID WC  . polyethylene glycol  17 g Oral Daily  . senna  2 tablet Oral QHS  . sodium bicarbonate  1,300 mg Oral BID  . sodium chloride flush  10-40 mL Intracatheter Q12H  . tamsulosin  0.4 mg Oral Daily    Continuous Infusions: . sodium chloride 45 mL/hr at 07/05/18 0212    PRN Meds: acetaminophen, alum & mag hydroxide-simeth, bisacodyl, HYDROmorphone (DILAUDID) injection, HYDROmorphone, ibuprofen, LORazepam, menthol-cetylpyridinium, ondansetron **OR** ondansetron (ZOFRAN) IV, sodium chloride flush  Physical Exam        Well developed, awake, alert, appropriate, NAD. Appears tremulous and somewhat anxious today.  Vital Signs: BP (!) 149/80   Pulse 82   Temp 98.2 F (36.8 C) (Oral)   Resp 16   Ht 6\' 2"  (1.88 m)   Wt 112 kg   SpO2 98%   BMI 31.70 kg/m  SpO2: SpO2: 98 % O2 Device: O2 Device: Room Air O2 Flow Rate: O2 Flow Rate (L/min): 2 L/min  Intake/output summary:   Intake/Output Summary (Last 24 hours) at 07/05/2018 1724 Last data filed at 07/05/2018 1100 Gross per 24 hour  Intake -  Output 1325 ml  Net -1325 ml   LBM: Last BM Date: 07/05/18 Baseline Weight: Weight: 101.3 kg Most recent weight: Weight: 112 kg       Palliative Assessment/Data: 40%    Flowsheet Rows     Most Recent Value  Intake Tab  Referral Department  Oncology  Unit at Time of Referral  Med/Surg Unit  Palliative  Care Primary Diagnosis  Cancer  Date Notified  06/26/18  Palliative Care Type  New Palliative care  Reason for referral  Non-pain Symptom  Date of Admission  06/21/18  # of days IP prior to Palliative referral  5  Clinical Assessment  Psychosocial & Spiritual Assessment  Palliative Care Outcomes      Patient Active Problem List   Diagnosis Date Noted  . Lung nodules 07/03/2018  . Bladder spasm   . Metastatic cancer ([place])   . Pelvic mass   . Type II diabetes mellitus ([place]) 06/27/2018  . Coronary artery disease 06/27/2018  . AKI (acute kidney injury) ([place]) 06/27/2018  . Cancer associated pain   . DNR (do not resuscitate)   . Palliative care encounter   . Deep venous thrombosis ([place]) 06/26/2018  . Infection of lumbar spine ([place]) 06/21/2018  . Status post lumbar spine surgery for decompression of spinal cord 05/24/2018  . Abnormal EKG 05/25/2017  . S/P cervical spinal fusion 01/25/2016  . Cervical spinal stenosis 12/20/2015  . Cervical spondylosis 12/20/2015  . Painful orthopaedic hardware left hip with chronic trochanteric bursitis 07/30/2015  . Hip bursitis 07/30/2015  . Trochanteric bursitis of left hip 07/30/2015  . Chest pain with moderate risk of acute coronary syndrome 10/19/2014  . Macular hole, left eye 09/01/2014  . Macular hole of left eye 08/28/2014  . Other pancytopenia ([place])   . Sliding hiatal hernia   . Gastric erosions   . GI bleeding 06/04/2014  . Acute GI bleeding 06/04/2014  . Orthostatic hypotension 06/04/2014  . Acute upper gastrointestinal bleeding 06/04/2014  . Other specified diabetes mellitus without complications ([place])   . Acute blood loss anemia   . Thrombocytopenia ([place])   . Closed left hip fracture ([place]) 04/03/2014  . Fracture, femur ([place]) 04/03/2014  . Femur fracture ([place])  04/03/2014  . Upper airway cough syndrome 05/16/2013  . OSA (obstructive sleep apnea) 10/21/2012  . Macular hole 10/08/2012  . Preoperative clearance 09/30/2012  . PAF  (paroxysmal atrial fibrillation) ([place]) 01/21/2012  . Shortness of breath on exertion 01/11/2012  . Bradycardia, sinus 01/11/2012  . Hx of CABG 01/11/2012  . Essential hypertension   . Dyslipidemia   . ABDOMINAL PAIN-LLQ 02/04/2010  . HEMORRHOIDS 08/31/2008  . ESOPHAGEAL STRICTURE 08/31/2008  . GERD 08/31/2008  . Gastritis and gastroduodenitis 08/31/2008  . HIATAL HERNIA 08/31/2008  . DIVERTICULOSIS, COLON 08/31/2008  . Chronic interstitial cystitis 08/31/2008  . COLONIC POLYPS, ADENOMATOUS, HX OF 08/31/2008    Palliative Care Plan    Recommendations/Plan:  Continue XRT.  Patient would like to be discharged home when appropriate.  Son/Dtr and private help have been arranged to care for him in his home.  Based on Dr. Antonieta Pert recommendations he will want either [facility] or Hospice services.   He will need a hospital bed in his home.  Increased TD yesterday to 25 mcg.  Need to migrate towards a regimen he will be able to use at home.  Will decrease IV dilaudid dose and schedule oral dilaudid 4 mg q 8 hours.  Decreased laxatives as he is incontinent.  Goals of Care and Additional Recommendations:  Limitations on Scope of Treatment: Full Scope Treatment  Code Status:  DNR  Prognosis:   < 6 months secondary to wide spread high grade carcinoma as well as hypercoagulable state.   Discharge Planning:  To Be Determined  To his home with either Hospice or Home health.  Care plan was discussed with patient and son [name].  Thank you for allowing the Palliative Medicine Team to assist in the care of this patient.  Total time spent:  35 min.     Greater than 50%  of this time was spent counseling and coordinating care related to the above assessment and plan.  Florentina Jenny, PA-C Palliative Medicine  Please contact Palliative MedicineTeam phone at [phone] for questions and concerns between 7 am - 7 pm.   Please see AMION for individual provider  pager numbers.

## 2018-07-05 NOTE — Progress Notes (Signed)
   07/05/18 1414  Clinical Encounter Type  Visited With Patient  Visit Type Follow-up;Spiritual support  Referral From Palliative care team  Consult/Referral To Chaplain  The chaplain attempted a F/U spiritual care visit with the Pt.  The chaplain referenced the Pt.'s previous request for a theological discussion.  The Pt. politely declined the visit stating his goal is to rest after radiation. The chaplain accepted the Pt. decision and offered a F/U visit.  The chaplain updated the Pt. RN-Hannah.  The RN appreciated today's visit and is supportive of trying to find the best time for another spiritual care visit.

## 2018-07-05 NOTE — Care Management Important Message (Signed)
Important Message  Patient Details IM Letter given to the Case Manager to present to the Patient Name: Lucas Morales MRN: [id number] Date of Birth: [birth year]   Medicare Important Message Given:  Yes    Kerin Salen 07/05/2018, 12:44 [place] Message  Patient Details  Name: Lucas Morales MRN: [id number] Date of Birth: 1934/01/23   Medicare Important Message Given:  Yes    Kerin Salen 07/05/2018, 12:44 PM

## 2018-07-06 LAB — BASIC METABOLIC PANEL
Anion gap: 7 (ref 5–15)
BUN: 41 mg/dL — ABNORMAL HIGH (ref 8–23)
CO2: 21 mmol/L — ABNORMAL LOW (ref 22–32)
Calcium: 6.8 mg/dL — ABNORMAL LOW (ref 8.9–10.3)
Chloride: 110 mmol/L (ref 98–111)
Creatinine, Ser: 1.04 mg/dL (ref 0.61–1.24)
GFR calc Af Amer: >60 mL/min (ref >60)
GFR calc non Af Amer: >60 mL/min (ref >60)
Glucose, Bld: 124 mg/dL — ABNORMAL HIGH (ref 70–99)
Potassium: 4.4 mmol/L (ref 3.5–5.1)
Sodium: 138 mmol/L (ref 135–145)

## 2018-07-06 LAB — GLUCOSE, CAPILLARY
Glucose-Capillary: 89 mg/dL (ref 70–99)
Glucose-Capillary: 134 mg/dL — ABNORMAL HIGH (ref 70–99)
Glucose-Capillary: 139 mg/dL — ABNORMAL HIGH (ref 70–99)
Glucose-Capillary: 150 mg/dL — ABNORMAL HIGH (ref 70–99)

## 2018-07-06 LAB — MAGNESIUM: Magnesium: 2 mg/dL (ref 1.7–2.4)

## 2018-07-06 LAB — LACTATE DEHYDROGENASE: LDH: 233 U/L — ABNORMAL HIGH (ref 98–192)

## 2018-07-06 MED ORDER — ACETAMINOPHEN 325 MG PO TABS
650.0000 mg | ORAL_TABLET | Freq: Four times a day (QID) | ORAL | Status: DC | PRN
  Administered 2018-07-19: 18:00:00 650 mg via ORAL
  Filled 2018-07-06 (×2): qty 2

## 2018-07-06 MED ORDER — IBUPROFEN 200 MG PO TABS
600.0000 mg | ORAL_TABLET | Freq: Four times a day (QID) | ORAL | Status: DC | PRN
  Administered 2018-07-07 – 2018-07-09 (×2): 600 mg via ORAL
  Filled 2018-07-06 (×2): qty 3

## 2018-07-06 MED ORDER — LIP MEDEX EX OINT
TOPICAL_OINTMENT | CUTANEOUS | Status: AC
  Filled 2018-07-06: qty 7

## 2018-07-06 NOTE — Progress Notes (Signed)
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  He really has no specific complaints.  He still not eating all that great.  There is no nausea or vomiting.  He had problems with diarrhea last night.  He had been on lactulose.  I do not see any lactulose that is ordered now.  He says that his pain is doing fairly well.  I am not sure how much activity he is able to do.  Hopefully, physical therapy will continue to work with him and try to get him more mobile.  He has had no fever.  He has had no cough.  He has had no bleeding.  He is on Lovenox.  He is doing well with Lovenox right now.  His labs that he had today look okay.  His BUN is 41 creatinine 1.04.  I still am awaiting the molecular profile of his cancer.  I would not expect this to be back for another week at least.  He did well with radiation therapy yesterday.  I told him that probably will be another week before radiation starts to help.  He still has the catheter associated with his bladder.  I know the nurses on [address]. are doing a great job with him.  Lucas Haw, MD  Lucas Morales 41:10

## 2018-07-06 NOTE — Addendum Note (Signed)
Encounter addended by: Kizzie Ide, RN on: 07/06/2018 11:53 PM  Actions taken: MAR administration accepted

## 2018-07-06 NOTE — Progress Notes (Signed)
Occupational Therapy Treatment Patient Details Name: Lucas Morales MRN: [id number] DOB: 04/07/1933 Today's Date: 07/06/2018    History of present illness This is an 85 year old male with a past medical history including hypertension, depression and anxiety, coronary artery disease, chronic interstitial cystitis, previous left hip fracture, previous cervical fusion, hyperlipidemia, restless leg syndrome, diabetes, arthritis, alpha 1 antitrypsin deficiency carrier, anemia, chronic back pain who was admitted for suspected postoperative lumbar decompression at L3-4 infection.  Surgery date was 05/10/2018. MRI suspicious for osteomyelitis. CT scan revealed R medial acetabular fx as well as lesions on liver suspicious for cancer.    OT comments  [employer] issued.  Educatedon use. Pt did fatigue quickly.    Follow Up Recommendations  SNF;Supervision/Assistance - 24 hour    Equipment Recommendations  3 in 1 bedside commode    Recommendations for Other Services      Precautions / Restrictions Precautions Precautions: Fall Restrictions RLE Weight Bearing: Weight bearing as tolerated       Mobility Bed Mobility Overal bed mobility: Needs Assistance Bed Mobility: Supine to Sit     Supine to sit: Mod assist     General bed mobility comments: pt in chair  Transfers       General transfer comment: did not perform        ADL either performed or assessed with clinical judgement   ADL Overall ADL's : Needs assistance/impaired Eating/Feeding: Set up   Grooming: Set up;Sitting                                       Vision Baseline Vision/History: Wears glasses Vision Assessment?: No apparent visual deficits          Cognition Arousal/Alertness: Awake/alert Behavior During Therapy: WFL for tasks assessed/performed Overall Cognitive Status: Within Functional Limits for tasks assessed                                           Exercises General Exercises - Upper Extremity Shoulder Flexion: 10 reps;Theraband;Both Theraband Level (Shoulder Flexion): Other (comment)(orange) Elbow Flexion: AROM;10 reps Elbow Extension: AROM;10 reps           Pertinent Vitals/ Pain       Pain Assessment: 0-10 Pain Score: 3  Pain Location: back, R hip and scrotum  Pain Descriptors / Indicators: Grimacing Pain Intervention(s): Monitored during session;Limited activity within patient's tolerance         Frequency  Min 3X/week        Progress Toward Goals  OT Goals(current goals can now be found in the care plan section)  Progress towards OT goals: Progressing toward goals     Plan Discharge plan remains appropriate       AM-PAC OT "6 Clicks" Daily Activity     Outcome Measure   Help from another person eating meals?: None Help from another person taking care of personal grooming?: A Little Help from another person toileting, which includes using toliet, bedpan, or urinal?: A Lot Help from another person bathing (including washing, rinsing, drying)?: A Lot Help from another person to put on and taking off regular upper body clothing?: A Little Help from another person to put on and taking off regular lower body clothing?: A Lot 6 Click Score: 16    End of Session  OT Visit Diagnosis: Unsteadiness on feet (R26.81);Other abnormalities of gait and mobility (R26.89);Muscle weakness (generalized) (M62.81)   Activity Tolerance Patient tolerated treatment well   Patient Left in chair   Nurse Communication          Time: 0321-2248 OT Time Calculation (min): 20 min  Charges: OT General Charges $OT Visit: 1 Visit OT Treatments $Therapeutic Exercise: 8-22 mins  Kari Baars, OT Acute Rehabilitation Services [phone] Office- [phone], Edwena Felty D 07/06/2018, 2:37 PM

## 2018-07-06 NOTE — Progress Notes (Signed)
PROGRESS NOTE    Lucas MAGGART  Morales:[id number] DOB: 1933-04-20 DOA: 06/21/2018 PCP: Merri Brunette, MD    Brief Narrative:   Lucas Morales is a 85 y.o. malewith medical history significant of adenomatous polyps, alpha-1 antitrypsin deficiency carrier, anemia, anxiety, depression, osteoarthritis of the knees and back, easy bruising, chronic bronchitis, chronic lower back pain, CAD, history of frequent diarrhea due to partial colectomy, diverticulosis, episodes of diverticulitis, hemorrhoids, gastritis, hiatal hernia, GERD, esophageal stricture, esophageal dysmotility, history of post CABG paroxysmal A. fib, history of bradycardia, hyperlipidemia, hypertension, OSA not on CPAP due to intolerance, restless leg syndrome, sciatic nerve pain, type 2 diabetes, urinary frequency whohad lumbar decompression L3-4 on 05/10/2018 and was admitted about a week ago due to suspected L3 vertebral body osteomyelitis. However, after reviewing all imaging and getting new imaging studies, it was determined that the patient had a metastatic lesion. 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 He was seen by oncology 3 days ago. He underwent biopsy of the area with interventional radiology. We are seeinghimconsultdue to AKI, worsening LFTs and multiple medical issues.   Developed AKI: Suspect multifactorial secondary to contrast and dehydration. Patient was transferred from [facility] to [facility] on 4/14 to start radiation treatment to pelvis mass.   Assessment & Plan:   Principal Problem:   Pelvic mass Active Problems:   Essential hypertension   Dyslipidemia   PAF (paroxysmal atrial fibrillation) (HCC)   OSA (obstructive sleep apnea)   Deep venous thrombosis (HCC)   Cancer associated pain   DNR (do not resuscitate)   Type II diabetes mellitus (HCC)   Coronary artery disease   AKI (acute kidney injury) (HCC)   Metastatic cancer (HCC)   Lung nodules   Bladder spasm   Pelvic mass  Multiple liver masses Multiple lung nodules Patient initially admitted following lumbar decompression L3-4 on 05/10/2018 with suspected L3 osteomyelitis.  CT pelvis notable for a 13 x 10 x 10 cm mass arising from the right ischium extending into the posterior aspect of the right acetabulum and adjacent soft tissues. --Medical and radiation oncology following, appreciate assistance --s/p CT guided biopsy lytic right pelvic bone lesion on 06/27/2018 by IR --AFP 39.7, CEA 2.2, PSA 2.08 --Pathology notable for high-grade carcinoma with unknown primary origin --Pending further molecular studies per oncology --Differential for primary lesion include lung primary vs pelvic sarcoma vs cholangiocarcinoma --MR brain negative for metastatic process --Oncology believes this is an extremely aggressive tumor with prognosis 2-3 months --continue XRT; completed #2/10 fractions; Last day planned 07/17/2018 --Continue treatment dose Lovenox for hypercoagulable state given malignancy  Pain of malignancy --Continue pain control with fentanyl patch --Dilaudid 4 mg q8hr and Decadron 8 mg p.o. daily --Dilaudid IV 0.5-1 mg prn for breakthrough pain --palliative care assisting with pain management  Acute renal failure Creatinine elevated to a high of 1.94 during hospitalization.  Etiology likely secondary to decreased oral intake and contrast exposure from CT abdomen/pelvis as well as urinary retention.  Renal ultrasound unrevealing. --Continue Foley catheter --Creatinine improving 1.94-->1.33-->1.16-->1.04 (0.96 on admission) --Avoid nephrotoxins, renally dose all medications --Continue to monitor renal function daily  Acute urinary retention Nausea likely secondary to edema and pelvic mass with compression. --Continue tamsulosin 0.4 mg p.o. daily --Continue Foley catheter until swelling improves then will attempt voiding trial  L3 lesion:  Likely metastatic disease and not consistent with infection,  infectious disease initially consulted during hospitalization, antibiotics have subsequently discontinued.  Hypertension: metoprolol succinate 50 mg p.o. daily  HLD:  holding statin due to transaminases.  Paroxysmal A. Fib: continue metoprolol succinate 50 mg p.o. daily  OSA: Not on CPAP.  Continue with oxygen supplementation prn  Acute DVT, right soleal vein:  US duplex doppler with findings consistent with acute deep vein thrombosis involving the right soleal vein. --Continue with treatment dose Lovenox.   Cancer associated pain:  Palliative care following, appreciate assistance. --Continue fentanyl patch, Dilaudid 4 mg p.o. q8h, Decadron --Dilaudid IV every 4 hours as needed severe pain  Type 2 diabetes:  --Lantus 10 units [place] qHS --Sliding scale insulin.   --Monitor glucose closely while on Decadron  Coronary artery disease: continue beta-blocker  Transaminases: Suspect related to liver masses , stable.   Hyponatremia: Improved with IV fluids.  Hyperkalemia;  --Received dose of lokelma.  --Renal diet --Continue to monitor electrolytes closely  Metabolic acidosis --continue sodium bicarb 1300mg  PO BID   DVT prophylaxis: Lovenox 100 mg SQ every 12 hours Code Status: Full code Family Communication: Discussed with daughter, Victorino Dike over the telephone today Disposition Plan: continue inpatient, PT recommends SNF versus home health; will likely remain inpatient for radiation treatments with planned completion on 07/17/2018.   Consultants:   Infectious disease  Radiation oncology  Medical oncology  Interventional radiology  Orthopedics  Procedures:  CT guided biopsy lytic right pelvic bone lesion on 06/27/2018 by IR  Antimicrobials:   Vancomycin 4/3 -4/6  Zosyn 4/3 - 4/4  Ceftriaxone 4/4 - 4/6   Subjective: Patient seen and examined at bedside, resting comfortably.  Tolerating radiation, completed session to yesterday out of 10.  Pain better  controlled on current regimen.  Continues with poor appetite; discussed needs to increase as much as he can oral still continue to get weaker.  No other complaints at this time.  Denies headache, no visual changes, no chest pain, no palpitations, no shortness of breath, no abdominal pain, no issues with bowel function, no paresthesias.  No acute events overnight per nursing staff.  Objective: Vitals:   06/29/18 0435 07/02/18 0745 07/04/18 1246 07/05/18 1133  BP: 134/80 (!) 149/80    Pulse: 88 82    Resp: 18 16    Temp: 98.2 F (36.8 C)     TempSrc: Oral     SpO2: 92% 98%    Weight:   114.7 kg 112 kg  Height:        Intake/Output Summary (Last 24 hours) at 07/06/2018 1136 Last data filed at 07/06/2018 0930 Gross per 24 hour  Intake 2181.33 ml  Output 1200 ml  Net 981.33 ml   Filed Weights   06/21/18 1652 07/04/18 1246 07/05/18 1133  Weight: 101.3 kg 114.7 kg 112 kg    Examination:  General exam: Appears calm and comfortable  Respiratory system: Clear to auscultation. Respiratory effort normal. Cardiovascular system: S1 & S2 heard, RRR. No JVD, murmurs, rubs, gallops or clicks.  2+ bilateral pedal edema Gastrointestinal system: Abdomen is nondistended, soft and nontender. No organomegaly or masses felt. Normal bowel sounds heard. Genitourinary: Significant scrotal edema noted, Foley catheter in place Central nervous system: Alert and oriented. No focal neurological deficits. Extremities: Symmetric 5 x 5 power. Skin: No rashes, lesions or ulcers Psychiatry: Judgement and insight appear normal. Mood & affect appropriate.     Data Reviewed: I have personally reviewed following labs and imaging studies  CBC: Recent Labs  Lab 06/30/18 0257 07/01/18 0318 07/02/18 0420 07/05/18 0432  WBC 6.4 7.9 9.3 10.2  NEUTROABS 5.4 6.4  --   --  HGB 10.4* 11.0* 11.0* 11.4*  HCT 31.6* 33.2* 33.5* 35.7*  MCV 98.8 98.8 98.8 101.7*  PLT 210 240 254 247   Basic Metabolic Panel: Recent  Labs  Lab 06/30/18 0257 07/01/18 0318 07/02/18 0420 07/04/18 0802 07/06/18 0345  NA 132* 135 136 137 138  K 5.3* 5.1 4.7 4.5 4.4  CL 101 103 105 108 110  CO2 19* 19* 21* 21* 21*  GLUCOSE 166* 149* 131* 134* 124*  BUN 74* 68* 54* 42* 41*  CREATININE 1.59* 1.45* 1.33* 1.16 1.04  CALCIUM 8.2* 8.1* 7.9* 7.3* 6.8*  MG  --   --  2.6*  --  2.0   GFR: Estimated Creatinine Clearance: 72.9 mL/min (by C-G formula based on SCr of 1.04 mg/dL). Liver Function Tests: Recent Labs  Lab 06/30/18 0257 07/01/18 0318 07/04/18 0802  AST 56* 59* 51*  ALT 55* 68* 88*  ALKPHOS 399* 443* 459*  BILITOT 0.5 1.0 1.0  PROT 5.5* 5.8* 6.0*  ALBUMIN 2.6* 2.8* 2.9*   No results for input(s): LIPASE, AMYLASE in the last 168 hours. No results for input(s): AMMONIA in the last 168 hours. Coagulation Profile: No results for input(s): INR, PROTIME in the last 168 hours. Cardiac Enzymes: No results for input(s): CKTOTAL, CKMB, CKMBINDEX, TROPONINI in the last 168 hours. BNP (last 3 results) No results for input(s): PROBNP in the last 8760 hours. HbA1C: No results for input(s): HGBA1C in the last 72 hours. CBG: Recent Labs  Lab 07/05/18 0809 07/05/18 1203 07/05/18 1742 07/05/18 2239 07/06/18 0758  GLUCAP 93 128* 252* 150* 89   Lipid Profile: No results for input(s): CHOL, HDL, LDLCALC, TRIG, CHOLHDL, LDLDIRECT in the last 72 hours. Thyroid Function Tests: No results for input(s): TSH, T4TOTAL, FREET4, T3FREE, THYROIDAB in the last 72 hours. Anemia Panel: No results for input(s): VITAMINB12, FOLATE, FERRITIN, TIBC, IRON, RETICCTPCT in the last 72 hours. Sepsis Labs: No results for input(s): PROCALCITON, LATICACIDVEN in the last 168 hours.  No results found for this or any previous visit (from the past 240 hour(s)).       Radiology Studies: No results found.      Scheduled Meds: . dexamethasone  8 mg Oral Daily  . enoxaparin (LOVENOX) injection  1 mg/kg Subcutaneous Q12H  . feeding  supplement (ENSURE ENLIVE)  237 mL Oral BID BM  . feeding supplement (PRO-STAT SUGAR FREE 64)  30 mL Oral BID WC  . fentaNYL  1 patch Transdermal Q72H  . HYDROmorphone  4 mg Oral Q8H  . insulin aspart  0-15 Units Subcutaneous TID WC  . insulin glargine  10 Units Subcutaneous QHS  . lip balm      . metoprolol succinate  50 mg Oral Daily  . multivitamin with minerals  1 tablet Oral Daily  . pantoprazole  40 mg Oral BID  . senna  2 tablet Oral QHS  . sodium bicarbonate  1,300 mg Oral BID  . sodium chloride flush  10-40 mL Intracatheter Q12H  . tamsulosin  0.4 mg Oral Daily   Continuous Infusions: . sodium chloride 45 mL/hr at 07/05/18 2335     LOS: 15 days    Time spent: 26 minutes    Alvira Philips [place], DO [facility] Pager [phone]  If 7PM-7AM, please contact night-coverage [email or web address] Password [facility] 07/06/2018, 11:36 AM

## 2018-07-06 NOTE — Addendum Note (Signed)
Encounter addended by: Kizzie Ide, RN on: 07/06/2018 11:53 PM  Actions taken: Flowsheet accepted

## 2018-07-06 NOTE — Addendum Note (Signed)
Encounter addended by: Kizzie Ide, RN on: 07/06/2018 9:09 PM  Actions taken: [facility] administration accepted, Inpatient Work List task edited

## 2018-07-06 NOTE — Addendum Note (Signed)
Encounter addended by: Kizzie Ide, RN on: 07/06/2018 7:48 PM  Actions taken: MAR administration accepted

## 2018-07-06 NOTE — Progress Notes (Signed)
Physical Therapy Treatment Patient Details Name: Lucas Morales MRN: [id number] DOB: 01-Apr-1933 Today's Date: 07/06/2018    History of Present Illness This is an 85 year old male with a past medical history including hypertension, depression and anxiety, coronary artery disease, chronic interstitial cystitis, previous left hip fracture, previous cervical fusion, hyperlipidemia, restless leg syndrome, diabetes, arthritis, alpha 1 antitrypsin deficiency carrier, anemia, chronic back pain who was admitted for suspected postoperative lumbar decompression at L3-4 infection.  Surgery date was 05/10/2018. MRI suspicious for osteomyelitis. CT scan revealed R medial acetabular fx as well as lesions on liver suspicious for cancer.     PT Comments    Pt assisted to [facility] (provided larger seat BSC for room due to pt's edema) and ambulated short distance into hallway.  Pt limited by fatigue and pain.  Repositioned to comfort in recliner with scrotum elevated with towels. If pt d/c home, may benefit from w/c for improved quality of life.  Pt with great difficulty and pain with ambulating even short distances.   Follow Up Recommendations  SNF     Equipment Recommendations  Wheelchair cushion (measurements PT);Wheelchair (measurements PT)    Recommendations for Other Services       Precautions / Restrictions Precautions Precautions: Fall Restrictions Weight Bearing Restrictions: No RLE Weight Bearing: Weight bearing as tolerated    Mobility  Bed Mobility Overal bed mobility: Needs Assistance Bed Mobility: Supine to Sit     Supine to sit: Mod assist     General bed mobility comments: pt requiring assist for his LEs  Transfers Overall transfer level: Needs assistance Equipment used: Rolling walker (2 wheeled) Transfers: Sit to/from [employer] Transfers Sit to Stand: Min assist;+2 safety/equipment Stand pivot transfers: Min assist;+2 safety/equipment       General transfer  comment: assist to rise and steady, utilized higher surfaces for pain and ease, assisted to [facility] per request first  Ambulation/Gait Ambulation/Gait assistance: Min assist;+2 safety/equipment Gait Distance (Feet): 8 Feet Assistive device: Rolling walker (2 wheeled) Gait Pattern/deviations: Step-through pattern;Decreased stride length;Trunk flexed     General Gait Details: provided wider RW for pt for more comfort, recliner following for safety, pt fatigued quickly   Stairs             Wheelchair Mobility    Modified Rankin (Stroke Patients Only)       Balance                                            Cognition Arousal/Alertness: Awake/alert Behavior During Therapy: WFL for tasks assessed/performed Overall Cognitive Status: Within Functional Limits for tasks assessed                                        Exercises      General Comments        Pertinent Vitals/Pain Pain Assessment: 0-10 Pain Score: 4  Pain Location: back, R hip and scrotum  Pain Descriptors / Indicators: Grimacing Pain Intervention(s): Premedicated before session;Monitored during session;Repositioned    Home Living                      Prior Function            PT Goals (current goals can now be found in the care plan section)  Acute Rehab PT Goals PT Goal Formulation: With patient Time For Goal Achievement: 07/20/18 Potential to Achieve Goals: Fair Progress towards PT goals: Progressing toward goals    Frequency           PT Plan Current plan remains appropriate    Co-evaluation              AM-PAC PT "6 Clicks" Mobility   Outcome Measure  Help needed turning from your back to your side while in a flat bed without using bedrails?: A Lot Help needed moving from lying on your back to sitting on the side of a flat bed without using bedrails?: A Little Help needed moving to and from a bed to a chair (including a wheelchair)?:  A Little Help needed standing up from a chair using your arms (e.g., wheelchair or bedside chair)?: A Little Help needed to walk in hospital room?: A Lot Help needed climbing 3-5 steps with a railing? : Total 6 Click Score: 14    End of Session Equipment Utilized During Treatment: Gait belt Activity Tolerance: Patient limited by pain;Patient limited by fatigue Patient left: in chair;with call bell/phone within reach   PT Visit Diagnosis: Muscle weakness (generalized) (M62.81);Difficulty in walking, not elsewhere classified (R26.2)     Time: 8786-7672 PT Time Calculation (min) (ACUTE ONLY): 23 min  Charges:  $Gait Training: 8-22 mins                     Carmelia Bake, PT, DPT Acute Rehabilitation Services Office: [phone] Pager: [phone]  Trena Platt 07/06/2018, 12:18 PM

## 2018-07-07 LAB — GLUCOSE, CAPILLARY
Glucose-Capillary: 80 mg/dL (ref 70–99)
Glucose-Capillary: 92 mg/dL (ref 70–99)
Glucose-Capillary: 168 mg/dL — ABNORMAL HIGH (ref 70–99)
Glucose-Capillary: 142 mg/dL — ABNORMAL HIGH (ref 70–99)

## 2018-07-07 LAB — COMPREHENSIVE METABOLIC PANEL
ALT: 80 U/L — ABNORMAL HIGH (ref 0–44)
AST: 47 U/L — ABNORMAL HIGH (ref 15–41)
Albumin: 2.8 g/dL — ABNORMAL LOW (ref 3.5–5.0)
Alkaline Phosphatase: 414 U/L — ABNORMAL HIGH (ref 38–126)
Anion gap: 7 (ref 5–15)
BUN: 38 mg/dL — ABNORMAL HIGH (ref 8–23)
CO2: 22 mmol/L (ref 22–32)
Calcium: 6.7 mg/dL — ABNORMAL LOW (ref 8.9–10.3)
Chloride: 110 mmol/L (ref 98–111)
Creatinine, Ser: 0.95 mg/dL (ref 0.61–1.24)
GFR calc Af Amer: >60 mL/min (ref >60)
GFR calc non Af Amer: >60 mL/min (ref >60)
Glucose, Bld: 104 mg/dL — ABNORMAL HIGH (ref 70–99)
Potassium: 4 mmol/L (ref 3.5–5.1)
Sodium: 139 mmol/L (ref 135–145)
Total Bilirubin: 1 mg/dL (ref 0.3–1.2)
Total Protein: 5.4 g/dL — ABNORMAL LOW (ref 6.5–8.1)

## 2018-07-07 LAB — LACTATE DEHYDROGENASE: LDH: 244 U/L — ABNORMAL HIGH (ref 98–192)

## 2018-07-07 NOTE — Progress Notes (Signed)
PROGRESS NOTE    Lucas Morales  OAC:[id number] DOB: 1933/08/12 DOA: 06/21/2018 PCP: Merri Brunette, MD    Brief Narrative:   Lucas Morales is a 85 y.o. malewith medical history significant of adenomatous polyps, alpha-1 antitrypsin deficiency carrier, anemia, anxiety, depression, osteoarthritis of the knees and back, easy bruising, chronic bronchitis, chronic lower back pain, CAD, history of frequent diarrhea due to partial colectomy, diverticulosis, episodes of diverticulitis, hemorrhoids, gastritis, hiatal hernia, GERD, esophageal stricture, esophageal dysmotility, history of post CABG paroxysmal A. fib, history of bradycardia, hyperlipidemia, hypertension, OSA not on CPAP due to intolerance, restless leg syndrome, sciatic nerve pain, type 2 diabetes, urinary frequency whohad lumbar decompression L3-4 on 05/10/2018 and was admitted about a week ago due to suspected L3 vertebral body osteomyelitis. However, after reviewing all imaging and getting new imaging studies, it was determined that the patient had a metastatic lesion. 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 He was seen by oncology 3 days ago. He underwent biopsy of the area with interventional radiology. We are seeinghimconsultdue to AKI, worsening LFTs and multiple medical issues.   Developed AKI: Suspect multifactorial secondary to contrast and dehydration. Patient was transferred from [facility] to [place] [facility] on 4/14 to start radiation treatment to pelvis mass.   Assessment & Plan:   Principal Problem:   Pelvic mass Active Problems:   Essential hypertension   Dyslipidemia   PAF (paroxysmal atrial fibrillation) (HCC)   OSA (obstructive sleep apnea)   Deep venous thrombosis (HCC)   Cancer associated pain   DNR (do not resuscitate)   Type II diabetes mellitus (HCC)   Coronary artery disease   AKI (acute kidney injury) (HCC)   Metastatic cancer (HCC)   Lung nodules   Bladder spasm   Pelvic mass  Multiple liver masses Multiple lung nodules Patient initially admitted following lumbar decompression L3-4 on 05/10/2018 with suspected L3 osteomyelitis.  CT pelvis notable for a 13 x 10 x 10 cm mass arising from the right ischium extending into the posterior aspect of the right acetabulum and adjacent soft tissues. --Medical and radiation oncology following, appreciate assistance --s/p CT guided biopsy lytic right pelvic bone lesion on 06/27/2018 by IR --AFP 39.7, CEA 2.2, PSA 2.08 --Pathology notable for high-grade carcinoma with unknown primary origin --Pending further molecular studies per oncology --Differential for primary lesion include lung primary vs pelvic sarcoma vs cholangiocarcinoma --MR brain negative for metastatic process --Oncology believes this is an extremely aggressive tumor with prognosis 2-3 months --continue XRT; completed #2/10 fractions; Last day planned 07/17/2018 --Continue treatment dose Lovenox for hypercoagulable state given malignancy  Pain of malignancy --Continue pain control with fentanyl patch --Dilaudid 4 mg q8hr and Decadron 8 mg p.o. daily --Dilaudid IV 0.5-1 mg prn for breakthrough pain --palliative care assisting with pain management  Acute renal failure: resolved Creatinine elevated to a high of 1.94 during hospitalization.  Etiology likely secondary to decreased oral intake and contrast exposure from CT abdomen/pelvis as well as urinary retention.  Renal ultrasound unrevealing. --Creatinine improving 1.94-->1.33-->1.16-->1.04-->0.95 (0.96 on admission) --Avoid nephrotoxins, renally dose all medications --Continue Foley catheter until edema improves; then will attempt voiding trial --Continue to monitor renal function daily  Acute urinary retention Nausea likely secondary to edema and pelvic mass with compression. --Continue tamsulosin 0.4 mg p.o. daily --Continue Foley catheter until swelling improves then will attempt voiding trial  L3  lesion:  Likely metastatic disease and not consistent with infection, infectious disease initially consulted during hospitalization, antibiotics have subsequently discontinued.  Hypertension: metoprolol succinate 50 mg p.o. daily  HLD: holding statin due to transaminases.  Paroxysmal A. Fib: continue metoprolol succinate 50 mg p.o. daily  OSA: Not on CPAP.  Continue with oxygen supplementation prn  Acute DVT, right soleal vein:  US duplex doppler with findings consistent with acute deep vein thrombosis involving the right soleal vein. --Continue with treatment dose Lovenox.   Cancer associated pain:  Palliative care following, appreciate assistance. --Continue fentanyl patch, Dilaudid 4 mg p.o. q8h, Decadron 8mg  PO daily --Dilaudid IV every 4 hours as needed severe breakthrough pain  Type 2 diabetes:  --Lantus 10 units [place] qHS --Sliding scale insulin.   --Monitor glucose closely while on Decadron  Coronary artery disease: continue beta-blocker  Transaminases: Suspect related to liver masses , stable.   Hyponatremia: Improved with IV fluids.  Hyperkalemia;  --Received dose of lokelma.  --Renal diet --Continue to monitor electrolytes closely  Metabolic acidosis --continue sodium bicarb 1300mg  PO BID   DVT prophylaxis: Lovenox 100 mg SQ every 12 hours Code Status: Full code Family Communication: Discussed with daughter, Victorino Dike over the telephone today Disposition Plan: continue inpatient, PT recommends SNF versus home health; will likely remain inpatient for radiation treatments with planned completion on 07/17/2018.   Consultants:   Infectious disease  Radiation oncology  Medical oncology  Interventional radiology  Orthopedics  Procedures:  CT guided biopsy lytic right pelvic bone lesion on 06/27/2018 by IR  Antimicrobials:   Vancomycin 4/3 -4/6  Zosyn 4/3 - 4/4  Ceftriaxone 4/4 - 4/6   Subjective: Patient seen and examined at bedside,  resting comfortably. Pain better controlled on current regimen.  Appetite improved this morning.  No other complaints at this time.  Denies headache, no visual changes, no chest pain, no palpitations, no shortness of breath, no abdominal pain, no issues with bowel function, no paresthesias.  No acute events overnight per nursing staff.  Objective: Vitals:   07/05/18 1133 07/06/18 1410 07/06/18 1957 07/07/18 0519  BP:   (!) 151/69 140/65  Pulse:  99 92 93  Resp:   18 17  Temp:  98.4 F (36.9 C) 97.9 F (36.6 C) 98.1 F (36.7 C)  TempSrc:  Oral Oral Oral  SpO2:   100% 100%  Weight: 112 kg     Height:        Intake/Output Summary (Last 24 hours) at 07/07/2018 1120 Last data filed at 07/07/2018 0811 Gross per 24 hour  Intake 1578.91 ml  Output 1701 ml  Net -122.09 ml   Filed Weights   06/21/18 1652 07/04/18 1246 07/05/18 1133  Weight: 101.3 kg 114.7 kg 112 kg    Examination:  General exam: Appears calm and comfortable  Respiratory system: Clear to auscultation. Respiratory effort normal. Cardiovascular system: S1 & S2 heard, RRR. No JVD, murmurs, rubs, gallops or clicks.  2+ bilateral pedal edema Gastrointestinal system: Abdomen is nondistended, soft and nontender. No organomegaly or masses felt. Normal bowel sounds heard. Genitourinary: Significant scrotal edema noted, Foley catheter in place Central nervous system: Alert and oriented. No focal neurological deficits. Extremities: Symmetric 5 x 5 power. Skin: No rashes, lesions or ulcers Psychiatry: Judgement and insight appear normal. Mood & affect appropriate.     Data Reviewed: I have personally reviewed following labs and imaging studies  CBC: Recent Labs  Lab 07/01/18 0318 07/02/18 0420 07/05/18 0432  WBC 7.9 9.3 10.2  NEUTROABS 6.4  --   --   HGB 11.0* 11.0* 11.4*  HCT 33.2* 33.5* 35.7*  MCV 98.8  98.8 101.7*  PLT 240 254 247   Basic Metabolic Panel: Recent Labs  Lab 07/01/18 0318 07/02/18 0420 07/04/18  0802 07/06/18 0345 07/07/18 0349  NA 135 136 137 138 139  K 5.1 4.7 4.5 4.4 4.0  CL 103 105 108 110 110  CO2 19* 21* 21* 21* 22  GLUCOSE 149* 131* 134* 124* 104*  BUN 68* 54* 42* 41* 38*  CREATININE 1.45* 1.33* 1.16 1.04 0.95  CALCIUM 8.1* 7.9* 7.3* 6.8* 6.7*  MG  --  2.6*  --  2.0  --    GFR: Estimated Creatinine Clearance: 79.8 mL/min (by C-G formula based on SCr of 0.95 mg/dL). Liver Function Tests: Recent Labs  Lab 07/01/18 0318 07/04/18 0802 07/07/18 0349  AST 59* 51* 47*  ALT 68* 88* 80*  ALKPHOS 443* 459* 414*  BILITOT 1.0 1.0 1.0  PROT 5.8* 6.0* 5.4*  ALBUMIN 2.8* 2.9* 2.8*   No results for input(s): LIPASE, AMYLASE in the last 168 hours. No results for input(s): AMMONIA in the last 168 hours. Coagulation Profile: No results for input(s): INR, PROTIME in the last 168 hours. Cardiac Enzymes: No results for input(s): CKTOTAL, CKMB, CKMBINDEX, TROPONINI in the last 168 hours. BNP (last 3 results) No results for input(s): PROBNP in the last 8760 hours. HbA1C: No results for input(s): HGBA1C in the last 72 hours. CBG: Recent Labs  Lab 07/06/18 0758 07/06/18 1203 07/06/18 1716 07/06/18 2000 07/07/18 0725  GLUCAP 89 134* 139* 150* 80   Lipid Profile: No results for input(s): CHOL, HDL, LDLCALC, TRIG, CHOLHDL, LDLDIRECT in the last 72 hours. Thyroid Function Tests: No results for input(s): TSH, T4TOTAL, FREET4, T3FREE, THYROIDAB in the last 72 hours. Anemia Panel: No results for input(s): VITAMINB12, FOLATE, FERRITIN, TIBC, IRON, RETICCTPCT in the last 72 hours. Sepsis Labs: No results for input(s): PROCALCITON, LATICACIDVEN in the last 168 hours.  No results found for this or any previous visit (from the past 240 hour(s)).       Radiology Studies: No results found.      Scheduled Meds: . dexamethasone  8 mg Oral Daily  . enoxaparin (LOVENOX) injection  1 mg/kg Subcutaneous Q12H  . feeding supplement (ENSURE ENLIVE)  237 mL Oral BID BM  .  feeding supplement (PRO-STAT SUGAR FREE 64)  30 mL Oral BID WC  . fentaNYL  1 patch Transdermal Q72H  . HYDROmorphone  4 mg Oral Q8H  . insulin aspart  0-15 Units Subcutaneous TID WC  . insulin glargine  10 Units Subcutaneous QHS  . metoprolol succinate  50 mg Oral Daily  . multivitamin with minerals  1 tablet Oral Daily  . pantoprazole  40 mg Oral BID  . senna  2 tablet Oral QHS  . sodium bicarbonate  1,300 mg Oral BID  . sodium chloride flush  10-40 mL Intracatheter Q12H  . tamsulosin  0.4 mg Oral Daily   Continuous Infusions: . sodium chloride 45 mL/hr at 07/06/18 1946     LOS: 16 days    Time spent: 27 minutes    Alvira Philips [place], DO [facility] Pager [phone]  If 7PM-7AM, please contact night-coverage [email or web address] Password [email or web address] 07/07/2018, 11:20 AM

## 2018-07-07 NOTE — Addendum Note (Signed)
Encounter addended by: Kizzie Ide, RN on: 07/07/2018 8:17 PM  Actions taken: MAR administration accepted, Flowsheet accepted

## 2018-07-07 NOTE — Addendum Note (Signed)
Encounter addended by: Kizzie Ide, RN on: 07/07/2018 6:20 AM  Actions taken: MAR administration accepted

## 2018-07-08 ENCOUNTER — Ambulatory Visit
Admit: 2018-07-08 | Discharge: 2018-07-08 | Disposition: A | Discharge status: Home / Self Care | Payer: Medicare Other | Attending: Radiation Oncology | Admitting: Radiation Oncology

## 2018-07-08 LAB — GLUCOSE, CAPILLARY
Glucose-Capillary: 90 mg/dL (ref 70–99)
Glucose-Capillary: 114 mg/dL — ABNORMAL HIGH (ref 70–99)
Glucose-Capillary: 210 mg/dL — ABNORMAL HIGH (ref 70–99)
Glucose-Capillary: 122 mg/dL — ABNORMAL HIGH (ref 70–99)

## 2018-07-08 LAB — CBC
HCT: 32.4 % — ABNORMAL LOW (ref 39.0–52.0)
Hemoglobin: 10.3 g/dL — ABNORMAL LOW (ref 13.0–17.0)
MCH: 32.9 pg (ref 26.0–34.0)
MCHC: 31.8 g/dL (ref 30.0–36.0)
MCV: 103.5 fL — ABNORMAL HIGH (ref 80.0–100.0)
Platelets: 157 10*3/uL (ref 150–400)
RBC: 3.13 MIL/uL — ABNORMAL LOW (ref 4.22–5.81)
RDW: 16.1 % — ABNORMAL HIGH (ref 11.5–15.5)
WBC: 10.1 10*3/uL (ref 4.0–10.5)
nRBC: 0 % (ref 0.0–0.2)

## 2018-07-08 LAB — LACTATE DEHYDROGENASE: LDH: 243 U/L — ABNORMAL HIGH (ref 98–192)

## 2018-07-08 MED ORDER — OXYBUTYNIN CHLORIDE 5 MG PO TABS
5.0000 mg | ORAL_TABLET | Freq: Two times a day (BID) | ORAL | Status: DC
  Administered 2018-07-08 – 2018-07-12 (×9): 5 mg via ORAL
  Filled 2018-07-08 (×10): qty 1

## 2018-07-08 MED ORDER — BELLADONNA ALKALOIDS-OPIUM 16.2-60 MG RE SUPP
0.5000 | Freq: Three times a day (TID) | RECTAL | Status: DC | PRN
  Administered 2018-07-13: 13:00:00 0.5 via RECTAL
  Filled 2018-07-08 (×2): qty 1

## 2018-07-08 MED ORDER — LACTULOSE 10 GM/15ML PO SOLN
10.0000 g | Freq: Every day | ORAL | Status: DC
  Administered 2018-07-08: 18:00:00 10 g via ORAL
  Filled 2018-07-08 (×2): qty 15

## 2018-07-08 NOTE — Progress Notes (Signed)
PT Cancellation Note  Patient Details Name: Lucas Morales MRN: [id number] DOB: 07-01-1933   Cancelled Treatment:     pt out of room this morning for Radiation then I was unable to return later in day.  Will attempt to see another time/day as schedule permits.  Rica Koyanagi  PTA Acute  Rehabilitation Services Pager      [phone] Office      [phone]

## 2018-07-08 NOTE — Addendum Note (Signed)
Encounter addended by: Kizzie Ide, RN on: 07/08/2018 6:17 AM  Actions taken: MAR administration accepted, Flowsheet accepted

## 2018-07-08 NOTE — Addendum Note (Signed)
Encounter addended by: Kizzie Ide, RN on: 07/08/2018 2:31 AM  Actions taken: MAR administration accepted, Flowsheet accepted

## 2018-07-08 NOTE — Progress Notes (Addendum)
Daily Progress Note   Patient Name: Lucas Morales       Date: 6/43/3295 DOB: 1934/01/14  Age: 85 y.o. MRN#: [id number] Attending Physician: [place] Eric J, DO Primary Care Physician: Deland Pretty, MD Admit Date: 06/21/2018  Reason for Consultation/Follow-up: Pain control and Psychosocial/spiritual support  Subjective: Pain in tip of the penis worse over the weekend.  Swelling in genitalia is worse and  worsens the pain.  Constipation has returned.  Patient concerned about getting Will notarized.  Chaplain and RN joined [place] and [employer] Will was notarized.    Lucas Morales reports Lucas Morales has not been out of bed.  Still forcing himself to eat.   Assessment: Overall Lucas Morales appears stronger today than last Friday, but Lucas Morales is slightly anxious.    Patient Profile/HPI:  85 y.o. male  with past medical history of CAD, Afib, alpha 1 antitrypsin deficiency, interstitial cystitis (with a tendency to have urinary retention), DM, depression and anxiety who was admitted on 06/21/2018 with what was thought to be a post op infection at L3-L4.  Work up has revealed widespread (lungs, liver, pelvis, bone) metastatic cancer of undetermined primary.  Lucas Morales is going for a biopsy this morning.   PMT has been asked to consult for symptom management.  Length of Stay: 17  Current Medications: Scheduled Meds:  . dexamethasone  8 mg Oral Daily  . enoxaparin (LOVENOX) injection  1 mg/kg Subcutaneous Q12H  . feeding supplement (ENSURE ENLIVE)  237 mL Oral BID BM  . feeding supplement (PRO-STAT SUGAR FREE 64)  30 mL Oral BID WC  . fentaNYL  1 patch Transdermal Q72H  . HYDROmorphone  4 mg Oral Q8H  . insulin aspart  0-15 Units Subcutaneous TID WC  . insulin glargine  10 Units Subcutaneous QHS  . metoprolol  succinate  50 mg Oral Daily  . multivitamin with minerals  1 tablet Oral Daily  . pantoprazole  40 mg Oral BID  . senna  2 tablet Oral QHS  . sodium bicarbonate  1,300 mg Oral BID  . sodium chloride flush  10-40 mL Intracatheter Q12H  . tamsulosin  0.4 mg Oral Daily    Continuous Infusions: . sodium chloride 45 mL/hr at 07/08/18 1435    PRN Meds: acetaminophen, alum & mag hydroxide-simeth, bisacodyl, HYDROmorphone (DILAUDID) injection, ibuprofen, LORazepam,  menthol-cetylpyridinium, ondansetron **OR** ondansetron (ZOFRAN) IV, sodium chloride flush  Physical Exam        Well developed male, Awake, alert, coherent, slightly anxious   Vital Signs: BP 140/65 (BP Location: Right Arm)   Pulse 93   Temp 98.1 F (36.7 C) (Oral)   Resp 17   Ht 6\' 2"  (1.88 m)   Wt 119.6 kg   SpO2 100%   BMI 33.84 kg/m  SpO2: SpO2: 100 % O2 Device: O2 Device: Room Air O2 Flow Rate: O2 Flow Rate (L/min): 2 L/min  Intake/output summary:   Intake/Output Summary (Last 24 hours) at 07/08/2018 1646 Last data filed at 07/08/2018 1500 Gross per 24 hour  Intake 1796.4 ml  Output 2050 ml  Net -253.6 ml   LBM: Last BM Date: 07/07/18 Baseline Weight: Weight: 101.3 kg Most recent weight: Weight: 119.6 kg       Palliative Assessment/Data:40%    Flowsheet Rows     Most Recent Value  Intake Tab  Referral Department  Oncology  Unit at Time of Referral  Med/Surg Unit  Palliative Care Primary Diagnosis  Cancer  Date Notified  06/26/18  Palliative Care Type  New Palliative care  Reason for referral  Non-pain Symptom  Date of Admission  06/21/18  # of days IP prior to Palliative referral  5  Clinical Assessment  Psychosocial & Spiritual Assessment  Palliative Care Outcomes      Patient Active Problem List   Diagnosis Date Noted  . Lung nodules 07/03/2018  . Bladder spasm   . Metastatic cancer ([place])   . Pelvic mass   . Type II diabetes mellitus ([place]) 06/27/2018  . Coronary artery disease  06/27/2018  . AKI (acute kidney injury) ([place]) 06/27/2018  . Cancer associated pain   . DNR (do not resuscitate)   . Palliative care encounter   . Deep venous thrombosis ([place]) 06/26/2018  . Infection of lumbar spine ([place]) 06/21/2018  . Status post lumbar spine surgery for decompression of spinal cord 05/24/2018  . Abnormal EKG 05/25/2017  . S/P cervical spinal fusion 01/25/2016  . Cervical spinal stenosis 12/20/2015  . Cervical spondylosis 12/20/2015  . Painful orthopaedic hardware left hip with chronic trochanteric bursitis 07/30/2015  . Hip bursitis 07/30/2015  . Trochanteric bursitis of left hip 07/30/2015  . Chest pain with moderate risk of acute coronary syndrome 10/19/2014  . Macular hole, left eye 09/01/2014  . Macular hole of left eye 08/28/2014  . Other pancytopenia ([place])   . Sliding hiatal hernia   . Gastric erosions   . GI bleeding 06/04/2014  . Acute GI bleeding 06/04/2014  . Orthostatic hypotension 06/04/2014  . Acute upper gastrointestinal bleeding 06/04/2014  . Other specified diabetes mellitus without complications ([place])   . Acute blood loss anemia   . Thrombocytopenia ([place])   . Closed left hip fracture ([place]) 04/03/2014  . Fracture, femur ([place]) 04/03/2014  . Femur fracture ([place]) 04/03/2014  . Upper airway cough syndrome 05/16/2013  . OSA (obstructive sleep apnea) 10/21/2012  . Macular hole 10/08/2012  . Preoperative clearance 09/30/2012  . PAF (paroxysmal atrial fibrillation) ([place]) 01/21/2012  . Shortness of breath on exertion 01/11/2012  . Bradycardia, sinus 01/11/2012  . Hx of CABG 01/11/2012  . Essential hypertension   . Dyslipidemia   . ABDOMINAL PAIN-LLQ 02/04/2010  . HEMORRHOIDS 08/31/2008  . ESOPHAGEAL STRICTURE 08/31/2008  . GERD 08/31/2008  . Gastritis and gastroduodenitis 08/31/2008  . HIATAL HERNIA 08/31/2008  . DIVERTICULOSIS, COLON 08/31/2008  . Chronic interstitial  cystitis 08/31/2008  . COLONIC POLYPS, ADENOMATOUS, HX OF 08/31/2008     Palliative Care Plan    Recommendations/Plan:  Added back lactulose daily.  Added oxybutynin daily.  Added belladonna suppository for bladder spasm PRN  Added lidocaine gel for "tip of penis pain"  Will need to wean off IV dilaudid completely.  Will start towards the end of the week as the radiation takes effect.  IF these measures do not relieve the pain in his penis please consider a urology consultation.  Goals of Care and Additional Recommendations:  Limitations on Scope of Treatment: Full Scope Treatment  Code Status:  DNR  Prognosis:   < 6 months secondary to high grade metastatic carcinoma   Discharge Planning:  Home with Home Health or Hospice pending treatment options and decisions.   Care plan was discussed with patient and RN  Thank you for allowing the Palliative Medicine Team to assist in the care of this patient.  Total time spent:  60 min.     Greater than 50%  of this time was spent counseling and coordinating care related to the above assessment and plan.  Florentina Jenny, PA-C Palliative Medicine  Please contact Palliative MedicineTeam phone at [phone] for questions and concerns between 7 am - 7 pm.   Please see AMION for individual provider pager numbers.

## 2018-07-08 NOTE — Progress Notes (Signed)
PROGRESS NOTE    Lucas Morales  XBJ:[id number] DOB: 1934-03-15 DOA: 06/21/2018 PCP: Merri Brunette, MD    Brief Narrative:   Lucas Morales is a 85 y.o. malewith medical history significant of adenomatous polyps, alpha-1 antitrypsin deficiency carrier, anemia, anxiety, depression, osteoarthritis of the knees and back, easy bruising, chronic bronchitis, chronic lower back pain, CAD, history of frequent diarrhea due to partial colectomy, diverticulosis, episodes of diverticulitis, hemorrhoids, gastritis, hiatal hernia, GERD, esophageal stricture, esophageal dysmotility, history of post CABG paroxysmal A. fib, history of bradycardia, hyperlipidemia, hypertension, OSA not on CPAP due to intolerance, restless leg syndrome, sciatic nerve pain, type 2 diabetes, urinary frequency whohad lumbar decompression L3-4 on 05/10/2018 and was admitted about a week ago due to suspected L3 vertebral body osteomyelitis. However, after reviewing all imaging and getting new imaging studies, it was determined that the patient had a metastatic lesion. 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 He was seen by oncology 3 days ago. He underwent biopsy of the area with interventional radiology. We are seeinghimconsultdue to AKI, worsening LFTs and multiple medical issues.   Developed AKI: Suspect multifactorial secondary to contrast and dehydration. Patient was transferred from [facility] to [facility] on 4/14 to start radiation treatment to pelvis mass.   Assessment & Plan:   Principal Problem:   Pelvic mass Active Problems:   Essential hypertension   Dyslipidemia   PAF (paroxysmal atrial fibrillation) (HCC)   OSA (obstructive sleep apnea)   Deep venous thrombosis (HCC)   Cancer associated pain   DNR (do not resuscitate)   Type II diabetes mellitus (HCC)   Coronary artery disease   AKI (acute kidney injury) (HCC)   Metastatic cancer (HCC)   Lung nodules   Bladder spasm   Pelvic mass  Multiple liver masses Multiple lung nodules Patient initially admitted following lumbar decompression L3-4 on 05/10/2018 with suspected L3 osteomyelitis.  CT pelvis notable for a 13 x 10 x 10 cm mass arising from the right ischium extending into the posterior aspect of the right acetabulum and adjacent soft tissues. --Medical and radiation oncology following, appreciate assistance --s/p CT guided biopsy lytic right pelvic bone lesion on 06/27/2018 by IR --AFP 39.7, CEA 2.2, PSA 2.08 --Pathology notable for high-grade carcinoma with unknown primary origin --Pending further molecular studies per oncology --Differential for primary lesion include lung primary vs pelvic sarcoma vs cholangiocarcinoma --MR brain negative for metastatic process --Oncology believes this is an extremely aggressive tumor with prognosis 2-3 months --continue XRT; completed #3/10 fractions; Last day planned 07/17/2018 --Continue treatment dose Lovenox for hypercoagulable state given malignancy  Pain of malignancy Patient states pain fairly well controlled at this moment. --Continue pain control with fentanyl patch --Dilaudid 4 mg q8hr and Decadron 8 mg p.o. daily --Dilaudid IV 0.5-1 mg prn for breakthrough pain --palliative care assisting with pain management  Acute renal failure: resolved Creatinine elevated to a high of 1.94 during hospitalization.  Etiology likely secondary to decreased oral intake and contrast exposure from CT abdomen/pelvis as well as urinary retention.  Renal ultrasound unrevealing. --Creatinine improving 1.94-->1.33-->1.16-->1.04-->0.95 (0.96 on admission) --Avoid nephrotoxins, renally dose all medications --Continue Foley catheter until edema improves; then will attempt voiding trial --Continue to monitor renal function daily  Acute urinary retention Etiology likely secondary to edema and pelvic mass with compression. --Continue tamsulosin 0.4 mg p.o. daily --Continue Foley catheter  until swelling improves then will attempt voiding trial  L3 lesion:  Likely metastatic disease and not consistent with infection, infectious disease  initially consulted during hospitalization, antibiotics have subsequently discontinued.  Hypertension: metoprolol succinate 50 mg p.o. daily  HLD: holding statin due to transaminases.  Paroxysmal A. Fib: continue metoprolol succinate 50 mg p.o. daily  OSA: Not on CPAP.  Continue with oxygen supplementation prn  Acute DVT, right soleal vein:  US duplex doppler with findings consistent with acute deep vein thrombosis involving the right soleal vein. --Continue with treatment dose Lovenox.   Cancer associated pain:  Palliative care following, appreciate assistance. --Continue fentanyl patch, Dilaudid 4 mg p.o. q8h, Decadron 8mg  PO daily --Dilaudid IV every 4 hours as needed severe breakthrough pain  Type 2 diabetes:  --Lantus 10 units [place] qHS --Sliding scale insulin.   --Monitor glucose closely while on Decadron  Coronary artery disease: continue beta-blocker  Transaminases: Suspect related to liver masses , stable.   Hyponatremia: Improved with IV fluids.  Hyperkalemia;  --Received dose of lokelma.  --Renal diet --Continue to monitor electrolytes closely  Metabolic acidosis --continue sodium bicarb 1300mg  PO BID   DVT prophylaxis: Lovenox 100 mg SQ every 12 hours Code Status: Full code Family Communication: Discussed with daughter, Victorino Dike over the telephone today Disposition Plan: continue inpatient, PT recommends SNF versus home health; will likely remain inpatient for radiation treatments with planned completion on 07/17/2018.   Consultants:   Infectious disease  Radiation oncology  Medical oncology  Interventional radiology  Orthopedics  Procedures:  CT guided biopsy lytic right pelvic bone lesion on 06/27/2018 by IR  Antimicrobials:   Vancomycin 4/3 -4/6  Zosyn 4/3 - 4/4  Ceftriaxone 4/4 -  4/6   Subjective: Patient seen and examined at bedside, resting comfortably. Pain better controlled on current regimen.  Appetite continues to improve.  No other complaints at this time. Awaiting to resume radiation today.  Denies headache, no visual changes, no chest pain, no palpitations, no shortness of breath, no abdominal pain, no issues with bowel function, no paresthesias.  No acute events overnight per nursing staff.  Objective: Vitals:   07/06/18 1410 07/06/18 1957 07/07/18 0519 07/08/18 1100  BP:  (!) 151/69 140/65   Pulse: 99 92 93   Resp:  18 17   Temp: 98.4 F (36.9 C) 97.9 F (36.6 C) 98.1 F (36.7 C)   TempSrc: Oral Oral Oral   SpO2:  100% 100%   Weight:    119.6 kg  Height:        Intake/Output Summary (Last 24 hours) at 07/08/2018 1327 Last data filed at 07/08/2018 1100 Gross per 24 hour  Intake 1749.56 ml  Output 2400 ml  Net -650.44 ml   Filed Weights   07/04/18 1246 07/05/18 1133 07/08/18 1100  Weight: 114.7 kg 112 kg 119.6 kg    Examination:  General exam: Appears calm and comfortable  Respiratory system: Clear to auscultation. Respiratory effort normal. Cardiovascular system: S1 & S2 heard, RRR. No JVD, murmurs, rubs, gallops or clicks.  2+ bilateral pedal edema Gastrointestinal system: Abdomen is nondistended, soft and nontender. No organomegaly or masses felt. Normal bowel sounds heard. Genitourinary: Significant scrotal edema noted, Foley catheter in place Central nervous system: Alert and oriented. No focal neurological deficits. Extremities: Symmetric 5 x 5 power. Skin: No rashes, lesions or ulcers Psychiatry: Judgement and insight appear normal. Mood & affect appropriate.     Data Reviewed: I have personally reviewed following labs and imaging studies  CBC: Recent Labs  Lab 07/02/18 0420 07/05/18 0432 07/08/18 1019  WBC 9.3 10.2 10.1  HGB 11.0* 11.4* 10.3*  HCT 33.5*  35.7* 32.4*  MCV 98.8 101.7* 103.5*  PLT 254 247 157   Basic  Metabolic Panel: Recent Labs  Lab 07/02/18 0420 07/04/18 0802 07/06/18 0345 07/07/18 0349  NA 136 137 138 139  K 4.7 4.5 4.4 4.0  CL 105 108 110 110  CO2 21* 21* 21* 22  GLUCOSE 131* 134* 124* 104*  BUN 54* 42* 41* 38*  CREATININE 1.33* 1.16 1.04 0.95  CALCIUM 7.9* 7.3* 6.8* 6.7*  MG 2.6*  --  2.0  --    GFR: Estimated Creatinine Clearance: 82.4 mL/min (by C-G formula based on SCr of 0.95 mg/dL). Liver Function Tests: Recent Labs  Lab 07/04/18 0802 07/07/18 0349  AST 51* 47*  ALT 88* 80*  ALKPHOS 459* 414*  BILITOT 1.0 1.0  PROT 6.0* 5.4*  ALBUMIN 2.9* 2.8*   No results for input(s): LIPASE, AMYLASE in the last 168 hours. No results for input(s): AMMONIA in the last 168 hours. Coagulation Profile: No results for input(s): INR, PROTIME in the last 168 hours. Cardiac Enzymes: No results for input(s): CKTOTAL, CKMB, CKMBINDEX, TROPONINI in the last 168 hours. BNP (last 3 results) No results for input(s): PROBNP in the last 8760 hours. HbA1C: No results for input(s): HGBA1C in the last 72 hours. CBG: Recent Labs  Lab 07/07/18 1136 07/07/18 1706 07/07/18 2057 07/08/18 0734 07/08/18 1150  GLUCAP 92 168* 142* 90 114*   Lipid Profile: No results for input(s): CHOL, HDL, LDLCALC, TRIG, CHOLHDL, LDLDIRECT in the last 72 hours. Thyroid Function Tests: No results for input(s): TSH, T4TOTAL, FREET4, T3FREE, THYROIDAB in the last 72 hours. Anemia Panel: No results for input(s): VITAMINB12, FOLATE, FERRITIN, TIBC, IRON, RETICCTPCT in the last 72 hours. Sepsis Labs: No results for input(s): PROCALCITON, LATICACIDVEN in the last 168 hours.  No results found for this or any previous visit (from the past 240 hour(s)).       Radiology Studies: No results found.      Scheduled Meds: . dexamethasone  8 mg Oral Daily  . enoxaparin (LOVENOX) injection  1 mg/kg Subcutaneous Q12H  . feeding supplement (ENSURE ENLIVE)  237 mL Oral BID BM  . feeding supplement  (PRO-STAT SUGAR FREE 64)  30 mL Oral BID WC  . fentaNYL  1 patch Transdermal Q72H  . HYDROmorphone  4 mg Oral Q8H  . insulin aspart  0-15 Units Subcutaneous TID WC  . insulin glargine  10 Units Subcutaneous QHS  . metoprolol succinate  50 mg Oral Daily  . multivitamin with minerals  1 tablet Oral Daily  . pantoprazole  40 mg Oral BID  . senna  2 tablet Oral QHS  . sodium bicarbonate  1,300 mg Oral BID  . sodium chloride flush  10-40 mL Intracatheter Q12H  . tamsulosin  0.4 mg Oral Daily   Continuous Infusions: . sodium chloride 45 mL/hr at 07/08/18 0800     LOS: 17 days    Time spent: 26 minutes    Alvira Philips [place], DO [facility] Pager [phone]  If 7PM-7AM, please contact night-coverage [email or web address] Password [email or web address] 07/08/2018, 1:27 PM

## 2018-07-08 NOTE — Progress Notes (Signed)
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  He had a decent weekend.  He said he got out of bed.  He says he is eating better.  He is having no nausea.  There is no bowel incontinence.  His bowel regimen has been adjusted.  He is getting radiation therapy.  He will continue today.  He has had no cough or shortness of breath.  He is on Lovenox.  He is doing pretty well with Lovenox.  He has had no fever.  There is been no bleeding.  He has had no rashes.  We still are awaiting the molecular profile of his tumor.  His vital signs are all stable.  Temperature 97.4.  Pulse 93.  Blood pressure 143/88.  His head neck exam shows no thrush.  He has no adenopathy in the neck.  Lungs are clear bilaterally.  Cardiac exam is irregular rate and irregular rhythm consistent with atrial fibrillation.  The rate is controlled.  Abdomen is soft.  Bowel sounds are present.  He has no fluid wave.  Extremities shows some swelling in the right leg.  Skin exam shows no rashes.  Neurological exam is nonfocal.  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  It is high-grade.  He is getting radiation therapy for a pelvic mass.  He has thromboembolic disease as the mass is sitting on his iliac vessels.  He will continue his radiation therapy.  I am glad to see that his appetite is doing better.  I am glad that he got out of bed.  I know physical therapy is working really hard with him.  I appreciate everybody's help on 5 E. 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  Lattie Haw, MD  Romans 8:28

## 2018-07-08 NOTE — Progress Notes (Signed)
OT Cancellation Note  Patient Details Name: Lucas Morales MRN: [id number] DOB: [birth year]   Cancelled Treatment:    Reason Eval/Treat Not Completed: Patient at procedure or test/ unavailable  Pt gone for radiation . Will check back on pt later in day or next day  Kari Baars, [place] [phone] Office- [phone], Thereasa Parkin 07/08/2018, 11:48 AM

## 2018-07-08 NOTE — Care Management Important Message (Signed)
Important Message  Patient Details IM Letter given to the Case Manager to present to the Patient Name: Lucas Morales MRN: [id number] Date of Birth: 05/22/1933   Medicare Important Message Given:  Yes    Kerin Salen 07/08/2018, 12:05 PM

## 2018-07-08 NOTE — Progress Notes (Signed)
  Radiation Oncology         [phone] ________________________________  Name: Lucas Morales MRN: [id number]  Date: 07/03/2018  DOB: 1934/01/27  SIMULATION AND TREATMENT PLANNING NOTE    ICD-10-CM   1. Metastatic cancer ([place]) C79.9     DIAGNOSIS: Unknown Primary metastatic to the right lower pelvis area or primary originating in the right ischium extending into the adjacent soft tissue.  NARRATIVE:  The patient was brought to the [place].  Identity was confirmed.  All relevant records and images related to the planned course of therapy were reviewed.  The patient freely provided informed written consent to proceed with treatment after reviewing the details related to the planned course of therapy. The consent form was witnessed and verified by the simulation staff.  Then, the patient was set-up in a stable reproducible  supine position for radiation therapy.  CT images were obtained.  Surface markings were placed.  The CT images were loaded into the planning software.  Then the target and avoidance structures were contoured.  Treatment planning then occurred.  The radiation prescription was entered and confirmed.  Then, I designed and supervised the construction of a total of 3 medically necessary complex treatment devices.  I have requested : Isodose Plan.  I have ordered:dose calc.  PLAN:  The patient will receive 30 Gy in 10 fractions directed at the large arising from the right ischium extending into the adjacent soft tissues.  -----------------------------------  Blair Promise, PhD, MD

## 2018-07-09 ENCOUNTER — Ambulatory Visit
Admit: 2018-07-09 | Discharge: 2018-07-09 | Disposition: A | Discharge status: Home / Self Care | Payer: Medicare Other | Attending: Radiation Oncology | Admitting: Radiation Oncology

## 2018-07-09 LAB — GLUCOSE, CAPILLARY
Glucose-Capillary: 90 mg/dL (ref 70–99)
Glucose-Capillary: 128 mg/dL — ABNORMAL HIGH (ref 70–99)
Glucose-Capillary: 154 mg/dL — ABNORMAL HIGH (ref 70–99)
Glucose-Capillary: 144 mg/dL — ABNORMAL HIGH (ref 70–99)

## 2018-07-09 LAB — LACTATE DEHYDROGENASE: LDH: 227 U/L — ABNORMAL HIGH (ref 98–192)

## 2018-07-09 MED ORDER — LACTULOSE 10 GM/15ML PO SOLN
10.0000 g | ORAL | Status: DC
  Administered 2018-07-11: 09:00:00 10 g via ORAL
  Filled 2018-07-09: qty 15

## 2018-07-09 MED ORDER — BACITRACIN-NEOMYCIN-POLYMYXIN 400-5-5000 EX OINT
TOPICAL_OINTMENT | CUTANEOUS | Status: DC | PRN
  Administered 2018-07-13: 14:00:00 via TOPICAL
  Filled 2018-07-09 (×2): qty 1

## 2018-07-09 MED ORDER — DEXAMETHASONE 4 MG PO TABS
6.0000 mg | ORAL_TABLET | Freq: Every day | ORAL | Status: DC
  Administered 2018-07-10 – 2018-07-11 (×2): 6 mg via ORAL
  Filled 2018-07-09 (×2): qty 2

## 2018-07-09 MED ORDER — DOCUSATE SODIUM 100 MG PO CAPS
100.0000 mg | ORAL_CAPSULE | Freq: Two times a day (BID) | ORAL | Status: DC
  Administered 2018-07-09 – 2018-07-13 (×7): 100 mg via ORAL
  Filled 2018-07-09 (×12): qty 1

## 2018-07-09 NOTE — Progress Notes (Signed)
Chaplain responding to consult from palliative.    In partnership with Florentina Jenny, Chaplain provided support around Pt's desire to have last will and testament notarized.  Pt presented as alert and oriented x4, Chaplain notarized document.      Jerene Pitch, MDiv, [facility]

## 2018-07-09 NOTE — Progress Notes (Signed)
Spoke with Lucas Morales at bedside.  He is sitting in the recliner chair in some pain, but is able to converse easily with me.   He reports on-going pain in his genitalia secondary to swelling.  He had a bowel movement last night.  He is eating better on a regular diet.  Radiation fatigues him greatly and afterward the PT/OT therapists come when he is too tired to work with them.  He asks if someone could help him walk in the evenings after he has rested.  PE: Well developed, awake, alert, does not appear in distress.  Hands are somewhat tremulous CV irreg irreg no m/r/g Resp no distress, CTA Abdomen soft, nt, nd, +bs LE with bilateral swelling (3+).  Ted hose on left leg.  Assessment: Metastatic high grade carcinoma with multiple lytic lesions, nondisplaced fracture of the rt acetabulum and ischium, as well as a large soft tissue mass (11.5 cm) in the same area.  Still having significant pain and swelling in the genitalia.  Progressing thru radiation.  Recommendations:  Consider a urology consult for pain /swelling in the genitalia and tip of the penis.  He is currently on opioids, oxybutinin, flomax, lidocaine gel and belladonna suppositories (PRN).    Anticipate that when radiation is complete he will go home with 24 hour care and either home health (if he is continuing to pursue oncologic therapy) or hospice.  Will request that RN or Tech assist him with ambulation in the evening.  Will continue to follow with you checking in intermittently - please call if needed.  Florentina Jenny, PA-C Palliative Medicine Pager: [phone]  Time 35 min.

## 2018-07-09 NOTE — Progress Notes (Signed)
OT Cancellation Note  Patient Details Name: Lucas Morales MRN: [id number] DOB: 08/28/1933   Cancelled Treatment:    Reason Eval/Treat Not Completed: Patient at procedure or test/ unavailable. Pt leaving for radiation upon OT checking on him.  Pt stated he would likely be fatigued after.  Will check on pt later this day or next day  Kari Baars, [place] [phone] Office- [phone], Thereasa Parkin 07/09/2018, 2:07 PM

## 2018-07-09 NOTE — Progress Notes (Signed)
Physical Therapy Treatment Patient Details Name: Lucas Morales MRN: [id number] DOB: 27-Dec-1933 Today's Date: 07/09/2018    History of Present Illness This is an 85 year old male with a past medical history including hypertension, depression and anxiety, coronary artery disease, chronic interstitial cystitis, previous left hip fracture, previous cervical fusion, hyperlipidemia, restless leg syndrome, diabetes, arthritis, alpha 1 antitrypsin deficiency carrier, anemia, chronic back pain who was admitted for suspected postoperative lumbar decompression at L3-4 infection.  Surgery date was 05/10/2018. MRI suspicious for osteomyelitis. CT scan revealed R medial acetabular fx as well as lesions on liver suspicious for cancer.     PT Comments    Pt with improved hallway ambulation this session, limited by fatigue and dyspnea on exertion.  Pt with HR and SPO2 of 134 bpm and 89%, respectively, post-ambulation. Pt recovered to WNL sats and HR after ~2 minutes rest post-ambulation. Pt with significant tightness of LEs due to edema, so pt instructed in gentle isometric exercises of LEs to improve circulation via muscle pumps. PT to continue to follow acutely.    Follow Up Recommendations  SNF     Equipment Recommendations  Wheelchair cushion (measurements PT);Wheelchair (measurements PT)    Recommendations for Other Services       Precautions / Restrictions Precautions Precautions: Fall Restrictions Weight Bearing Restrictions: No RLE Weight Bearing: Weight bearing as tolerated(with RW)    Mobility  Bed Mobility Overal bed mobility: Needs Assistance Bed Mobility: Sit to Supine       Sit to supine: Mod assist;HOB elevated   General bed mobility comments: Pt on BSC with NT upon PT arrival. Mod assist for sit to supine for LE lifting and placement in bed, hip elevation off bed to straighten pt's body position in supine.   Transfers Overall transfer level: Needs assistance Equipment  used: Rolling walker (2 wheeled) Transfers: Sit to/from Stand Sit to Stand: Min assist         General transfer comment: Pt stood with NT assist for power up.   Ambulation/Gait Ambulation/Gait assistance: +2 safety/equipment;Min guard Gait Distance (Feet): 50 Feet Assistive device: Rolling walker (2 wheeled) Gait Pattern/deviations: Step-through pattern;Decreased stride length;Trunk flexed Gait velocity: decr    General Gait Details: Pt with use of bari RW for comfort. Min guard for safety, verbal cuing for placement in RW and upright posture. Pt with dyspnea 3/4 during ambulation, sats at 89% post-ambulation with HR of 134 bpm. Sats and HR returned to WNL after 2 minutes rest post-ambulation.   Stairs             Wheelchair Mobility    Modified Rankin (Stroke Patients Only)       Balance Overall balance assessment: Needs assistance   Sitting balance-Leahy Scale: Fair       Standing balance-Leahy Scale: Poor Standing balance comment: relies on BUE support                            Cognition Arousal/Alertness: Awake/alert Behavior During Therapy: WFL for tasks assessed/performed Overall Cognitive Status: Within Functional Limits for tasks assessed                                        Exercises General Exercises - Lower Extremity Ankle Circles/Pumps: AROM;Both;Supine;10 reps Quad Sets: AROM;Both;10 reps;Supine Gluteal Sets: AROM;Both;10 reps;Supine    General Comments  Pertinent Vitals/Pain Pain Assessment: 0-10 Pain Score: 3  Pain Location: RLE, with LE exercise; and scrotum Pain Descriptors / Indicators: Grimacing;Sore Pain Intervention(s): Limited activity within patient's tolerance;Monitored during session;Repositioned    Home Living                      Prior Function            PT Goals (current goals can now be found in the care plan section) Acute Rehab PT Goals PT Goal Formulation: With  patient Time For Goal Achievement: 07/20/18 Potential to Achieve Goals: Fair Progress towards PT goals: Progressing toward goals    Frequency           PT Plan Current plan remains appropriate    Co-evaluation              AM-PAC PT "6 Clicks" Mobility   Outcome Measure  Help needed turning from your back to your side while in a flat bed without using bedrails?: A Little Help needed moving from lying on your back to sitting on the side of a flat bed without using bedrails?: A Lot Help needed moving to and from a bed to a chair (including a wheelchair)?: A Little Help needed standing up from a chair using your arms (e.g., wheelchair or bedside chair)?: A Little Help needed to walk in hospital room?: A Little Help needed climbing 3-5 steps with a railing? : Total 6 Click Score: 15    End of Session Equipment Utilized During Treatment: Gait belt Activity Tolerance: Patient limited by pain;Patient limited by fatigue Patient left: with call bell/phone within reach;in bed;with nursing/sitter in room(Pt preparing for bath, NT present) Nurse Communication: Mobility status PT Visit Diagnosis: Muscle weakness (generalized) (M62.81);Difficulty in walking, not elsewhere classified (R26.2)     Time: 6967-8938 PT Time Calculation (min) (ACUTE ONLY): 12 min  Charges:  $Gait Training: 8-22 mins                     Julien Girt, PT Acute Rehabilitation Services Pager [phone]  Office [phone]    [name] 07/09/2018, 4:44 PM

## 2018-07-09 NOTE — Progress Notes (Signed)
[place] for Enoxaparin Indication: Blood clots/IVC thrombus and DVT  Allergies  Allergen Reactions  . Morphine Itching    Patient Measurements: Height: 6\' 2"  (188 cm) Weight: 259 lb 4.2 oz (117.6 kg) IBW/kg (Calculated) : 82.2  Vital Signs: Temp: 97.5 F (36.4 C) (04/21 0435) Temp Source: Oral (04/21 0435) BP: 128/85 (04/21 0435) Pulse Rate: 80 (04/21 0435)  Labs: Recent Labs    07/07/18 0349 07/08/18 1019  HGB  --  10.3*  HCT  --  32.4*  PLT  --  157  CREATININE 0.95  --     Estimated Creatinine Clearance: 81.7 mL/min (by C-G formula based on SCr of 0.95 mg/dL).   Assessment: 85 yo male with recent back surgery now found to have high-grade carcinoma with unknown primary origin. 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 Also has DVT. Pt was transitioned to Lovenox post biopsy.   Patient noted to have AKI suspected due to contrast and dehydration - resolved.  Today, 07/09/2018  SCr = 0.95 on 4/19 , CrCl ~80 mL/min. SCr WNL and stable.   H/H remains stable. Platelets are within normal limits.   Per discussion with RN, no signs/symptoms of bleeding.  Weight has fluctuated throughout admission. Edema previously noted  101kg on 4/3, 114.7kg on 4/16 and 112 kg 4/17, 119 kg on 4/20, 117 kg today on 4/21  Goal of Therapy:  Therapeutic anticoagulation Monitor platelets by anticoagulation protocol: Yes   Plan:   Continue enoxaparin 115 mg (1 mg/kg) subQ q12hr  CBC at least q72h  Monitor weight and adjust dose needed  Monitor renal function and adjust dose as needed  Monitor for signs/symptoms of bleeding  Thanks for allowing pharmacy to be a part of this patient's care.  Lenis Noon, PharmD 07/09/18 8:54 AM

## 2018-07-09 NOTE — Progress Notes (Signed)
PROGRESS NOTE    [name]  TKW:[id number] DOB: 02/11/1934 DOA: 06/21/2018 PCP: Deland Pretty, MD    Brief Narrative:   Lucas Morales is a 85 y.o. malewith medical history significant of adenomatous polyps, alpha-1 antitrypsin deficiency carrier, anemia, anxiety, depression, osteoarthritis of the knees and back, easy bruising, chronic bronchitis, chronic lower back pain, CAD, history of frequent diarrhea due to partial colectomy, diverticulosis, episodes of diverticulitis, hemorrhoids, gastritis, hiatal hernia, GERD, esophageal stricture, esophageal dysmotility, history of post CABG paroxysmal A. fib, history of bradycardia, hyperlipidemia, hypertension, OSA not on CPAP due to intolerance, restless leg syndrome, sciatic nerve pain, type 2 diabetes, urinary frequency whohad lumbar decompression L3-4 on 05/10/2018 and was admitted about a week ago due to suspected L3 vertebral body osteomyelitis. However, after reviewing all imaging and getting new imaging studies, it was determined that the patient had a metastatic lesion. 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 He was seen by oncology 3 days ago. He underwent biopsy of the area with interventional radiology. We are seeinghimconsultdue to AKI, worsening LFTs and multiple medical issues.   Developed AKI: Suspect multifactorial secondary to contrast and dehydration. Patient was transferred from [facility] to [place] [facility] on 4/14 to start radiation treatment to pelvis mass.   Assessment & Plan:   Principal Problem:   Pelvic mass Active Problems:   Essential hypertension   Dyslipidemia   PAF (paroxysmal atrial fibrillation) (HCC)   OSA (obstructive sleep apnea)   Deep venous thrombosis (HCC)   Cancer associated pain   DNR (do not resuscitate)   Type II diabetes mellitus ([place])   Coronary artery disease   AKI (acute kidney injury) ([place])   Metastatic cancer (HCC)   Lung nodules   Bladder spasm   Pelvic mass  Multiple liver masses Multiple lung nodules Patient initially admitted following lumbar decompression L3-4 on 05/10/2018 with suspected L3 osteomyelitis.  CT pelvis notable for a 13 x 10 x 10 cm mass arising from the right ischium extending into the posterior aspect of the right acetabulum and adjacent soft tissues. --Medical and radiation oncology following, appreciate assistance --s/p CT guided biopsy lytic right pelvic bone lesion on 06/27/2018 by IR --AFP 39.7, CEA 2.2, PSA 2.08 --Pathology notable for high-grade carcinoma with unknown primary origin --Pending further molecular studies per oncology --Differential for primary lesion include lung primary vs pelvic sarcoma vs cholangiocarcinoma --MR brain negative for metastatic process --Oncology believes this is an extremely aggressive tumor with prognosis 2-3 months --continue XRT; completed #4/10 fractions; Last day planned 07/17/2018 --Continue treatment dose Lovenox for hypercoagulable state given malignancy  Pain of malignancy Patient states pain fairly well controlled at this moment. --Continue pain control with fentanyl patch 40mcg --Dilaudid 4 mg q8hr and Decadron 8 mg p.o. daily --Dilaudid IV 0.5-1 mg prn for breakthrough pain --palliative care assisting with pain management  Acute renal failure: resolved Creatinine elevated to a high of 1.94 during hospitalization.  Etiology likely secondary to decreased oral intake and contrast exposure from CT abdomen/pelvis as well as urinary retention.  Renal ultrasound unrevealing. --Creatinine improving 1.94-->1.33-->1.16-->1.04-->0.95 (0.96 on admission) --Avoid nephrotoxins, renally dose all medications --Continue Foley catheter until edema improves; then will attempt voiding trial --Continue to monitor renal function daily  Acute urinary retention Etiology likely secondary to edema and pelvic mass with compression. --Continue tamsulosin 0.4 mg p.o. daily --Continue Foley catheter  until swelling improves then will attempt voiding trial  L3 lesion:  Likely metastatic disease and not consistent with infection, infectious disease  initially consulted during hospitalization, antibiotics have subsequently discontinued.  Hypertension: metoprolol succinate 50 mg p.o. daily  HLD: holding statin due to transaminases.  Paroxysmal A. Fib: continue metoprolol succinate 50 mg p.o. daily  OSA: Not on CPAP.  Continue with oxygen supplementation prn  Acute DVT, right soleal vein:  US duplex doppler with findings consistent with acute deep vein thrombosis involving the right soleal vein. --Continue with treatment dose Lovenox.   Cancer associated pain:  Palliative care following, appreciate assistance. --Continue fentanyl patch, Dilaudid 4 mg p.o. q8h, Decadron 8mg  PO daily --Dilaudid IV every 4 hours as needed severe breakthrough pain  Type 2 diabetes:  --Lantus 10 units [place] qHS --Sliding scale insulin.   --Monitor glucose closely while on Decadron  Coronary artery disease: continue beta-blocker  Transaminases: Suspect related to liver masses , stable.   Hyponatremia: Improved with IV fluids.  Hyperkalemia;  --Received dose of lokelma.  --Renal diet --Continue to monitor electrolytes closely  Metabolic acidosis --continue sodium bicarb 1300mg  PO BID   DVT prophylaxis: Lovenox 100 mg SQ every 12 hours Code Status: Full code Family Communication: Discussed with daughter, [name] over the telephone today Disposition Plan: continue inpatient, PT recommends SNF versus home health; will likely remain inpatient for radiation treatments with planned completion on 07/17/2018.   Consultants:   Infectious disease  Radiation oncology  Medical oncology  Interventional radiology  Orthopedics  Procedures:  CT guided biopsy lytic right pelvic bone lesion on 06/27/2018 by IR  Antimicrobials:   Vancomycin 4/3 -4/6  Zosyn 4/3 - 4/4  Ceftriaxone 4/4 -  4/6   Subjective: Patient seen and examined at bedside, resting comfortably. Pain better controlled on current regimen.  Appetite continues to improve.  No other complaints at this time. Awaiting radiation today.  Denies headache, no visual changes, no chest pain, no palpitations, no shortness of breath, no abdominal pain, no issues with bowel function, no paresthesias.  No acute events overnight per nursing staff.  Objective: Vitals:   07/06/18 1957 07/07/18 0519 07/08/18 1100 07/09/18 0800  BP: (!) 151/69 140/65    Pulse: 92 93    Resp: 18 17    Temp: 97.9 F (36.6 C) 98.1 F (36.7 C)    TempSrc: Oral Oral    SpO2: 100% 100%    Weight:   119.6 kg 117.6 kg  Height:        Intake/Output Summary (Last 24 hours) at 07/09/2018 1416 Last data filed at 07/09/2018 0500 Gross per 24 hour  Intake 28.09 ml  Output 950 ml  Net -921.91 ml   Filed Weights   07/05/18 1133 07/08/18 1100 07/09/18 0800  Weight: 112 kg 119.6 kg 117.6 kg    Examination:  General exam: Appears calm and comfortable  Respiratory system: Clear to auscultation. Respiratory effort normal. Cardiovascular system: S1 & S2 heard, RRR. No JVD, murmurs, rubs, gallops or clicks.  2+ bilateral pedal edema Gastrointestinal system: Abdomen is nondistended, soft and nontender. No organomegaly or masses felt. Normal bowel sounds heard. Genitourinary: Significant scrotal edema noted, Foley catheter in place Central nervous system: Alert and oriented. No focal neurological deficits. Extremities: Symmetric 5 x 5 power. Skin: No rashes, lesions or ulcers Psychiatry: Judgement and insight appear normal. Mood & affect appropriate.     Data Reviewed: I have personally reviewed following labs and imaging studies  CBC: Recent Labs  Lab 07/05/18 0432 07/08/18 1019  WBC 10.2 10.1  HGB 11.4* 10.3*  HCT 35.7* 32.4*  MCV 101.7* 103.5*  PLT 247  161   Basic Metabolic Panel: Recent Labs  Lab 07/04/18 0802 07/06/18 0345  07/07/18 0349  NA 137 138 139  K 4.5 4.4 4.0  CL 108 110 110  CO2 21* 21* 22  GLUCOSE 134* 124* 104*  BUN 42* 41* 38*  CREATININE 1.16 1.04 0.95  CALCIUM 7.3* 6.8* 6.7*  MG  --  2.0  --    GFR: Estimated Creatinine Clearance: 81.7 mL/min (by C-G formula based on SCr of 0.95 mg/dL). Liver Function Tests: Recent Labs  Lab 07/04/18 0802 07/07/18 0349  AST 51* 47*  ALT 88* 80*  ALKPHOS 459* 414*  BILITOT 1.0 1.0  PROT 6.0* 5.4*  ALBUMIN 2.9* 2.8*   No results for input(s): LIPASE, AMYLASE in the last 168 hours. No results for input(s): AMMONIA in the last 168 hours. Coagulation Profile: No results for input(s): INR, PROTIME in the last 168 hours. Cardiac Enzymes: No results for input(s): CKTOTAL, CKMB, CKMBINDEX, TROPONINI in the last 168 hours. BNP (last 3 results) No results for input(s): PROBNP in the last 8760 hours. HbA1C: No results for input(s): HGBA1C in the last 72 hours. CBG: Recent Labs  Lab 07/08/18 1150 07/08/18 1618 07/08/18 2114 07/09/18 0732 07/09/18 1156  GLUCAP 114* 210* 122* 90 128*   Lipid Profile: No results for input(s): CHOL, HDL, LDLCALC, TRIG, CHOLHDL, LDLDIRECT in the last 72 hours. Thyroid Function Tests: No results for input(s): TSH, T4TOTAL, FREET4, T3FREE, THYROIDAB in the last 72 hours. Anemia Panel: No results for input(s): VITAMINB12, FOLATE, FERRITIN, TIBC, IRON, RETICCTPCT in the last 72 hours. Sepsis Labs: No results for input(s): PROCALCITON, LATICACIDVEN in the last 168 hours.  No results found for this or any previous visit (from the past 240 hour(s)).       Radiology Studies: No results found.      Scheduled Meds: . [START ON 07/10/2018] dexamethasone  6 mg Oral Daily  . docusate sodium  100 mg Oral BID  . enoxaparin (LOVENOX) injection  1 mg/kg Subcutaneous Q12H  . feeding supplement (ENSURE ENLIVE)  237 mL Oral BID BM  . feeding supplement (PRO-STAT SUGAR FREE 64)  30 mL Oral BID WC  . fentaNYL  1 patch  Transdermal Q72H  . HYDROmorphone  4 mg Oral Q8H  . insulin aspart  0-15 Units Subcutaneous TID WC  . insulin glargine  10 Units Subcutaneous QHS  . [START ON 07/11/2018] lactulose  10 g Oral QODAY  . metoprolol succinate  50 mg Oral Daily  . multivitamin with minerals  1 tablet Oral Daily  . oxybutynin  5 mg Oral BID  . pantoprazole  40 mg Oral BID  . senna  2 tablet Oral QHS  . sodium bicarbonate  1,300 mg Oral BID  . sodium chloride flush  10-40 mL Intracatheter Q12H  . tamsulosin  0.4 mg Oral Daily   Continuous Infusions: . sodium chloride 45 mL/hr at 07/08/18 1435     LOS: 18 days    Time spent: 28 minutes    Eric J [place] DO [facility] Pager [phone]  If 7PM-7AM, please contact night-coverage [email or web address] Password [email or web address] 07/09/2018, 2:16 PM

## 2018-07-10 ENCOUNTER — Other Ambulatory Visit: Payer: Self-pay | Admitting: Hematology & Oncology

## 2018-07-10 ENCOUNTER — Ambulatory Visit
Admit: 2018-07-10 | Discharge: 2018-07-10 | Disposition: A | Discharge status: Home / Self Care | Payer: Medicare Other | Attending: Radiation Oncology | Admitting: Radiation Oncology

## 2018-07-10 ENCOUNTER — Encounter (HOSPITAL_COMMUNITY): Payer: Self-pay | Admitting: Hematology & Oncology

## 2018-07-10 DIAGNOSIS — E785 Hyperlipidemia, unspecified: Secondary | ICD-10-CM

## 2018-07-10 DIAGNOSIS — E872 Acidosis: Secondary | ICD-10-CM

## 2018-07-10 DIAGNOSIS — I480 Paroxysmal atrial fibrillation: Secondary | ICD-10-CM

## 2018-07-10 DIAGNOSIS — E876 Hypokalemia: Secondary | ICD-10-CM

## 2018-07-10 DIAGNOSIS — N3289 Other specified disorders of bladder: Secondary | ICD-10-CM

## 2018-07-10 DIAGNOSIS — G893 Neoplasm related pain (acute) (chronic): Secondary | ICD-10-CM

## 2018-07-10 DIAGNOSIS — E871 Hypo-osmolality and hyponatremia: Secondary | ICD-10-CM

## 2018-07-10 DIAGNOSIS — G4733 Obstructive sleep apnea (adult) (pediatric): Secondary | ICD-10-CM

## 2018-07-10 DIAGNOSIS — I10 Essential (primary) hypertension: Secondary | ICD-10-CM

## 2018-07-10 DIAGNOSIS — C799 Secondary malignant neoplasm of unspecified site: Secondary | ICD-10-CM

## 2018-07-10 DIAGNOSIS — R918 Other nonspecific abnormal finding of lung field: Secondary | ICD-10-CM

## 2018-07-10 LAB — BASIC METABOLIC PANEL
Anion gap: 5 (ref 5–15)
BUN: 25 mg/dL — ABNORMAL HIGH (ref 8–23)
CO2: 20 mmol/L — ABNORMAL LOW (ref 22–32)
Calcium: 5.6 mg/dL — CL (ref 8.9–10.3)
Chloride: 115 mmol/L — ABNORMAL HIGH (ref 98–111)
Creatinine, Ser: 0.82 mg/dL (ref 0.61–1.24)
GFR calc Af Amer: >60 mL/min (ref >60)
GFR calc non Af Amer: >60 mL/min (ref >60)
Glucose, Bld: 84 mg/dL (ref 70–99)
Potassium: 3.2 mmol/L — ABNORMAL LOW (ref 3.5–5.1)
Sodium: 140 mmol/L (ref 135–145)

## 2018-07-10 LAB — HEPATIC FUNCTION PANEL
ALT: 83 U/L — ABNORMAL HIGH (ref 0–44)
AST: 51 U/L — ABNORMAL HIGH (ref 15–41)
Albumin: 2.1 g/dL — ABNORMAL LOW (ref 3.5–5.0)
Alkaline Phosphatase: 306 U/L — ABNORMAL HIGH (ref 38–126)
Bilirubin, Direct: 0.2 mg/dL (ref 0.0–0.2)
Indirect Bilirubin: 0.4 mg/dL (ref 0.3–0.9)
Total Bilirubin: 0.6 mg/dL (ref 0.3–1.2)
Total Protein: 4.4 g/dL — ABNORMAL LOW (ref 6.5–8.1)

## 2018-07-10 LAB — CBC
HCT: 30.1 % — ABNORMAL LOW (ref 39.0–52.0)
Hemoglobin: 9.5 g/dL — ABNORMAL LOW (ref 13.0–17.0)
MCH: 32.5 pg (ref 26.0–34.0)
MCHC: 31.6 g/dL (ref 30.0–36.0)
MCV: 103.1 fL — ABNORMAL HIGH (ref 80.0–100.0)
Platelets: 119 10*3/uL — ABNORMAL LOW (ref 150–400)
RBC: 2.92 MIL/uL — ABNORMAL LOW (ref 4.22–5.81)
RDW: 16 % — ABNORMAL HIGH (ref 11.5–15.5)
WBC: 8.1 10*3/uL (ref 4.0–10.5)
nRBC: 0 % (ref 0.0–0.2)

## 2018-07-10 LAB — GLUCOSE, CAPILLARY
Glucose-Capillary: 72 mg/dL (ref 70–99)
Glucose-Capillary: 85 mg/dL (ref 70–99)
Glucose-Capillary: 167 mg/dL — ABNORMAL HIGH (ref 70–99)
Glucose-Capillary: 120 mg/dL — ABNORMAL HIGH (ref 70–99)

## 2018-07-10 LAB — PHOSPHORUS: Phosphorus: 2.1 mg/dL — ABNORMAL LOW (ref 2.5–4.6)

## 2018-07-10 LAB — MAGNESIUM: Magnesium: 1.3 mg/dL — ABNORMAL LOW (ref 1.7–2.4)

## 2018-07-10 LAB — LACTATE DEHYDROGENASE: LDH: 215 U/L — ABNORMAL HIGH (ref 98–192)

## 2018-07-10 MED ORDER — SODIUM CHLORIDE 0.9% FLUSH
3.0000 mL | Freq: Two times a day (BID) | INTRAVENOUS | Status: DC
  Administered 2018-07-10 – 2018-07-19 (×8): 3 mL via INTRAVENOUS

## 2018-07-10 MED ORDER — POTASSIUM CHLORIDE CRYS ER 20 MEQ PO TBCR
40.0000 meq | EXTENDED_RELEASE_TABLET | Freq: Two times a day (BID) | ORAL | Status: AC
  Administered 2018-07-10 – 2018-07-11 (×2): 40 meq via ORAL
  Filled 2018-07-10 (×2): qty 2

## 2018-07-10 MED ORDER — APIXABAN 5 MG PO TABS
5.0000 mg | ORAL_TABLET | Freq: Two times a day (BID) | ORAL | Status: DC
  Administered 2018-07-17 – 2018-07-19 (×4): 5 mg via ORAL
  Filled 2018-07-10 (×4): qty 1

## 2018-07-10 MED ORDER — POTASSIUM CHLORIDE CRYS ER 20 MEQ PO TBCR
40.0000 meq | EXTENDED_RELEASE_TABLET | Freq: Once | ORAL | Status: AC
  Administered 2018-07-10: 10:00:00 40 meq via ORAL
  Filled 2018-07-10: qty 2

## 2018-07-10 MED ORDER — LIDOCAINE HCL URETHRAL/MUCOSAL 2 % EX GEL
1.0000 "application " | Freq: Once | CUTANEOUS | Status: DC
  Filled 2018-07-10: qty 5

## 2018-07-10 MED ORDER — CALCIUM GLUCONATE-NACL 1-0.675 GM/50ML-% IV SOLN
1.0000 g | Freq: Once | INTRAVENOUS | Status: AC
  Administered 2018-07-10: 1000 mg via INTRAVENOUS
  Filled 2018-07-10: qty 50

## 2018-07-10 MED ORDER — MAGNESIUM SULFATE 50 % IJ SOLN
3.0000 g | Freq: Once | INTRAVENOUS | Status: AC
  Administered 2018-07-10: 17:00:00 3 g via INTRAVENOUS
  Filled 2018-07-10: qty 6

## 2018-07-10 MED ORDER — K PHOS MONO-SOD PHOS DI & MONO 155-852-130 MG PO TABS
500.0000 mg | ORAL_TABLET | Freq: Two times a day (BID) | ORAL | Status: AC
  Administered 2018-07-10 – 2018-07-11 (×2): 500 mg via ORAL
  Filled 2018-07-10 (×2): qty 2

## 2018-07-10 MED ORDER — APIXABAN 5 MG PO TABS
10.0000 mg | ORAL_TABLET | Freq: Two times a day (BID) | ORAL | Status: AC
  Administered 2018-07-10 – 2018-07-17 (×14): 10 mg via ORAL
  Filled 2018-07-10 (×14): qty 2

## 2018-07-10 MED ORDER — SODIUM CHLORIDE 0.9% FLUSH
3.0000 mL | INTRAVENOUS | Status: DC | PRN
  Administered 2018-07-18: 10:00:00 3 mL via INTRAVENOUS
  Filled 2018-07-10: qty 3

## 2018-07-10 MED ORDER — SODIUM CHLORIDE 0.9 % IV SOLN: 250.0000 mL | INTRAVENOUS | Status: DC | PRN

## 2018-07-10 NOTE — Progress Notes (Signed)
   07/10/18 1625  Clinical Encounter Type  Visited With Patient  Visit Type Follow-up  Referral From Chaplain  Consult/Referral To Chaplain  This chaplain was able to listen and complete a Pt. spiritual care visit.  The Pt. shared his family story and how his various faith traditions intersected different parts of his life.  The Pt. shared his struggle with the unknown of cancer.  The chaplain heard the Pt.'s cancer and hospitalization has created a barrier to being present with "touch" his 2 children and grandchild.  At this time, the Pt. Is able to hold the separation from his children on his own.  The Pt. invited the chaplain to intermittently F/U with Pt. Spiritual care.

## 2018-07-10 NOTE — Addendum Note (Signed)
Encounter addended by: Martie Round, RN on: 07/10/2018 9:31 PM  Actions taken: [facility] administration accepted, Flowsheet data copied forward, Flowsheet accepted, Patient Education documented on

## 2018-07-10 NOTE — Progress Notes (Signed)
PT Cancellation Note  Patient Details Name: Lucas Morales MRN: [id number] DOB: [birth year]   Cancelled Treatment:     PT attempted but deferred this date - pt being transported to radiation.  RN reports pt exhausted following radiation.  Will follow in am.   Mirakle Tomlin 07/10/2018, 1:50 PM

## 2018-07-10 NOTE — Progress Notes (Signed)
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  It sounds like he is doing a bit better with physical therapy.  He says he walked a little bit more.  He still has a lot of swelling in the scrotum.  This is where his pain is coming from.  He has an indwelling Foley catheter.  I think that the scrotal swelling is secondary to the pelvic mass that he has.  Hopefully, if we can get his trunk, then the scrotal swelling will improve.  I am still awaiting the molecular array study to see if we know what the primary origin is of this cancer.  I did get back the molecular genetic studies.  Unfortunately, we have nothing that we can target.  He has a low TMB.  He is negative for PD-L1.  As such, immunotherapy is not going to be useful.  I think if we are going to treat him, is going to end up having to be chemotherapy.  His labs are stable.  His white cell count is 8.1.  Hemoglobin 9.5.  Platelet count 119,000.  His creatinine is 0.82.  His calcium is only 5.6.  His albumin is 2.1.  Hopefully, he will get some IV calcium.  His appetite is okay.  He has had no nausea or vomiting.  There is been no diarrhea.  He is on Lovenox.  A recent clinical trial came out in the pneumo Journal of medicine which show that Eliquis is as effective as subcutaneous low molecular weight heparin.  Given this result, we might want to switch him over to Eliquis just for ease of use.  I will think more about this.  There really is no change in his physical exam.  He still has atrial fibrillation.  His scrotal swelling is quite extensive.  He is continuing his radiation therapy.  He is doing well with radiation so far.  Maybe, later this week we will have the test which can show [place] what the primary etiology is of this cancer.   Lattie Haw. MD  Philippians 4:8

## 2018-07-10 NOTE — Progress Notes (Signed)
OT Cancellation Note  Patient Details Name: Lucas Morales MRN: [id number] DOB: 1933/05/29   Cancelled Treatment:    Reason Eval/Treat Not Completed: Other (comment).  Pt is sleeping soundly after XRT.  Not wearing sling per RN. He does have a clean one in the bathroom, if he wants to wear it later. Printed diagram in chart and hard copy in chart. Will check back tomorrow.  Ismahan Lippman 07/10/2018, 3:36 PM  Lesle Chris, OTR/L Acute Rehabilitation Services [phone] [facility] pager [phone] office 07/10/2018

## 2018-07-10 NOTE — Progress Notes (Signed)
PROGRESS NOTE    Lucas CAPPIELLO  YYT:[id number] DOB: Sep 04, 1933 DOA: 06/21/2018 PCP: Deland Pretty, MD   Brief Narrative: Lucas Morales is a 85 y.o. malewith medical history significant of adenomatous polyps, alpha-1 antitrypsin deficiency carrier, anemia, anxiety, depression, osteoarthritis of the knees and back, easy bruising, chronic bronchitis, chronic lower back pain, CAD, history of frequent diarrhea due to partial colectomy, diverticulosis, episodes of diverticulitis, hemorrhoids, gastritis, hiatal hernia, GERD, esophageal stricture, esophageal dysmotility, history of post CABG paroxysmal A. fib, history of bradycardia, hyperlipidemia, hypertension, OSA not on CPAP due to intolerance, restless leg syndrome, sciatic nerve pain, type 2 diabetes, urinary frequency whohad lumbar decompression L3-4 on 05/10/2018 and was admitted about a week ago due to suspected L3 vertebral body osteomyelitis. However, after reviewing all imaging and getting new imaging studies, it was determined that the patient had a metastatic lesion. 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 He was seen by oncology 3 days ago. He underwent biopsy of the area with interventional radiology. We are seeinghimconsultdue to AKI, worsening LFTs and multiple medical issues.   Developed AKI: Suspect multifactorial secondary to contrast and dehydration. Patient was transferred from [facility] to [facility] on 4/14 to start radiation treatment to pelvis mass.  **Interim History He started radiation treatments and is on day 4 of 10.  Continues to have muscle scrotal swelling and feels uncomfortable ambulating with it.  Had severe electrolyte abnormalities are being repleted today  Assessment & Plan:   Principal Problem:   Pelvic mass Active Problems:   Essential hypertension   Dyslipidemia   PAF (paroxysmal atrial fibrillation) (HCC)   OSA (obstructive sleep apnea)   Deep venous thrombosis (HCC)   Cancer  associated pain   DNR (do not resuscitate)   Type II diabetes mellitus ([place])   Coronary artery disease   AKI (acute kidney injury) ([place])   Metastatic cancer (HCC)   Lung nodules   Bladder spasm  Pelvic mass with Metastasis  Multiple liver masses Multiple lung nodules -Patient initially admitted following lumbar decompression L3-4 on 05/10/2018 with suspected L3 osteomyelitis.   -CT pelvis notable for a 13 x 10 x 10 cm mass arising from the right ischium extending into the posterior aspect of the right acetabulum and adjacent soft tissues. -Medical and radiation oncology following, appreciate assistance -s/p CT guided biopsy lytic right pelvic bone lesion on 06/27/2018 by IR -AFP 39.7, CEA 2.2, PSA 2.08 -Pathology notable for high-grade carcinoma with unknown primary origin -Pending further molecular studies per oncology which came back today and per Oncology Unfortunately they have nothing that they can target and feel like he will need Chemotherapy if  -Differential for primary lesion include lung primary vs pelvic sarcoma vs cholangiocarcinoma -MR brain negative for metastatic process -Oncology believes this is an extremely aggressive tumor with prognosis 2-3 months -continue XRT; completed #4/10 fractions; Last day planned 07/17/2018 -Continued treatment dose Lovenox for hypercoagulable state given malignancy but changed to Apixaban given thrombocytopenia  Pain of malignancy Patient states pain fairly well controlled at this moment. --Continue pain control with fentanyl patch 82mcg --Dilaudid 4 mg q8hr and Decadron 8 mg p.o. daily --Dilaudid IV 0.5-1 mg prn for breakthrough pain --palliative care assisting with pain management  Acute renal failure, improved and resolved resolved -Creatinine elevated to a high of 1.94 during hospitalization. -Etiology likely secondary to decreased oral intake and contrast exposure from CT abdomen/pelvis as well as urinary retention. -Renal ultrasound  unrevealing. -Creatinine improving 1.94-->1.33-->1.16-->1.04-->0.95 and is now 0.82 (0.96  on admission) -Avoid nephrotoxins, renally dose all medications -Continue Foley catheter until edema improves; then will attempt voiding trial -Will Discontinue IVF now  -Continue to monitor renal function daily  Acute urinary retention and Massive Scrotal Swelling -Etiology likely secondary to edema and pelvic mass with compression. -Continue tamsulosin 0.4 mg p.o. daily -Continue Foley catheter until swelling improves then will attempt voiding trial -C/w Lidocaine, Oxybutynin, Opioids, and Belladonna Suppositories  L3 lesion -Likely metastatic disease and not consistent with infection, -Infectious disease initially consulted during hospitalization, antibiotics have subsequently discontinued.  Hypertension -C/w Metoprolol succinate 50 mg p.o. daily  HLD -Continue holding statin due to abnormal LFTs.  Paroxysmal A. Fib: -Continue metoprolol succinate 50 mg p.o. daily -Was on Anticoagulation with Full Dose Lovenox and will change to po Apixaban  OSA -Not on CPAP. Continue with oxygen supplementation prn  Acute DVT, right soleal vein:  -US duplex doppler with findings consistent with acute deep vein thrombosis involving the right soleal vein. -Continued with treatment dose Lovenox but will chang to Eliquis  Type 2 Diabetes  -C/w Lantus 10 units [place] qHS -C/w Moderate Novolog SSI AC  -Continue to Monitor Glucose closely while on Decadron -CBG's ranging from 72-154  Coronary Artery Disease -Continue beta-blocker with Metoprolol Succinate 50 mg po Daily   Macrocytic Anemia -Patient's Hb/Hct went from 11.4/35.7 -> 10.3/32.4 -> 9.5/30.1 -Continue to Monitor for S/Sx of Bleeding  -Check Anemia Panel in the AM -Repeat CBC in AM   Abnormal LFT's/Transaminitis -Suspect related to liver masses -Relatively unchanged -AST is now 51 (47) and ALT is now 83 (80) -Continue to Monitor  and Trend Hepatic Function -Repeat CMP in AM   Hyponatremia -Improved with IV fluids and is now 140 -Will Discontinue IV Maintenance Fluids at 45 mL/hr -Continue to Monitor and Trend Na+ and repeat CMP in AM.  Hypokalemia -Patient's K+ was 3.2 -Replete with po KCl 40 mEQ BID and po K Phos Neutral 500 mg BID x2 -Continue to monitor and replete as necessary -Repeat CMP in the a.m.  Hypocalcemia -Patient's Ca2+ was 5.6 -Replete with IV Calcium Gluconate 1 gram -Continue to Monitor and Replete as Necessary  -Repeat Ca2+ in AM   Hypomagnesemia -Patient magnesium level this morning was 1.3 -Replete with IV mag sulfate 3 g -Continue monitor and replete as necessary -Repeat magnesium level in the a.m.  Hypophosphatemia -Patient's phosphorus levels morning was 2.1 -Replete with p.o. K-Phos Neutral 500 mg twice daily x2 doses -Continue monitor replete as necessary -Repeat phosphorus level in the a.m.  Metabolic Acidosis -Continue Sodium Bicarbonate 1300mg  PO BID -CO2 is now 20 andd AG is 5  Thrombocytopenia  -Worsening -Platelet Count has been dropping and has dropped over the course of the last 5-10 days -Has been on Full Dose Lovenox -Concern for HIT so will send Ab; Pharmacy Notified and will switch Anticoagulation to Eliquis -Platelet Count is now 119,000 -Continue to Monitor for S/Sx of Bleeding -Repeat CBC in AM  DVT prophylaxis: Currently was on Full Dose Lovenox but will change to Apixaban  Code Status: DO NOT RESUSCITATE Family Communication: No family at bedside and attempted to update daughter via Telephone but she did not answer Disposition Plan: Continue Radiation Treatment and PT recommends SNF versus home health; will likely remain inpatient for radiation treatments with planned completion on 07/17/2018  Consultants:   Palliative Care Medicine   Medical Oncology   Radiation Oncology  Orthopedic Surgery  Interventional Radiology   Procedures:  CT  guided biopsy lytic right  pelvic bone lesion on 06/27/2018 by IR  Antimicrobials:  Anti-infectives (From admission, onward)   Start     Dose/Rate Route Frequency Ordered Stop   06/27/18 1000  fluconazole (DIFLUCAN) tablet 100 mg  Status:  Discontinued     100 mg Oral Daily 06/27/18 0625 07/05/18 0944   06/24/18 0700  vancomycin (VANCOCIN) 1,250 mg in sodium chloride 0.9 % 250 mL IVPB  Status:  Discontinued     1,250 mg 166.7 mL/hr over 90 Minutes Intravenous Every 12 hours 06/23/18 1916 06/25/18 0815   06/22/18 1300  cefTRIAXone (ROCEPHIN) 2 g in sodium chloride 0.9 % 100 mL IVPB  Status:  Discontinued     2 g 200 mL/hr over 30 Minutes Intravenous Every 24 hours 06/22/18 1217 06/25/18 0758   06/22/18 0700  vancomycin (VANCOCIN) 1,000 mg in sodium chloride 0.9 % 250 mL IVPB  Status:  Discontinued     1,000 mg 250 mL/hr over 60 Minutes Intravenous Every 12 hours 06/21/18 1813 06/23/18 1916   06/22/18 0300  piperacillin-tazobactam (ZOSYN) IVPB 3.375 g  Status:  Discontinued     3.375 g 12.5 mL/hr over 240 Minutes Intravenous Every 8 hours 06/21/18 1813 06/22/18 1217   06/21/18 1700  piperacillin-tazobactam (ZOSYN) IVPB 3.375 g     3.375 g 100 mL/hr over 30 Minutes Intravenous  Once 06/21/18 1650 06/21/18 1847   06/21/18 1700  vancomycin (VANCOCIN) 2,000 mg in sodium chloride 0.9 % 500 mL IVPB     2,000 mg 250 mL/hr over 120 Minutes Intravenous  Once 06/21/18 1658 06/21/18 2200     Subjective: Seen and examined at bedside and was complaining of scrotal discomfort.  Pain was little bit better but states it hurts when he ambulates.  Continuing radiation and is possible later on today.  Denies chest pain, lightheadedness or dizziness.  Denies any shortness of breath nausea vomiting but remains fluid overloaded and has lower extremities and scrotum so fluids have been stopped.  He has no other concerns or complaints at this time and attempted to update his daughter but she did not  answer.  Objective: Vitals:   07/06/18 1957 07/07/18 0519 07/08/18 1100 07/09/18 0800  BP: (!) 151/69 140/65    Pulse: 92 93    Resp: 18 17    Temp: 97.9 F (36.6 C) 98.1 F (36.7 C)    TempSrc: Oral Oral    SpO2: 100% 100%    Weight:   119.6 kg 117.6 kg  Height:        Intake/Output Summary (Last 24 hours) at 07/10/2018 1478 Last data filed at 07/10/2018 2956 Gross per 24 hour  Intake 10 ml  Output 775 ml  Net -765 ml   Filed Weights   07/05/18 1133 07/08/18 1100 07/09/18 0800  Weight: 112 kg 119.6 kg 117.6 kg    Examination: Physical Exam:  Constitutional: WN/WD obese elderly Caucasian male in NAD and appears calm but somewhat uncomfortable Eyes: Lids and conjunctivae normal, sclerae anicteric  ENMT: External Ears, Nose appear normal. Grossly normal hearing. Mucous membranes are moist.  Neck: Appears normal, supple, no cervical masses, normal ROM, no appreciable thyromegaly; no JVD Respiratory: Diminished to auscultation bilaterally, no wheezing, rales, rhonchi or crackles.  Cardiovascular: RRR, no murmurs / rubs / gallops. S1 and S2 auscultated. 2-3+ LE extremity edema. Abdomen: Soft, non-tender, Distended due to body habitus. No masses palpated. No appreciable hepatosplenomegaly. Bowel sounds positive x4.  GU: Deferred. But has massive scrotal swelling and penile swelling with indwelling foley catheter  in place. Musculoskeletal: No clubbing / cyanosis of digits/nails. No joint deformity upper and lower extremities.  Skin: No rashes, lesions, ulcers on a limited skin evaluation. No induration; Warm and dry.  Neurologic: CN 2-12 grossly intact with no focal deficits. Romberg sign and cerebellar reflexes not assessed.  Psychiatric: Normal judgment and insight. Alert and oriented x 3. Normal mood and appropriate affect.   Data Reviewed: I have personally reviewed following labs and imaging studies  CBC: Recent Labs  Lab 07/05/18 0432 07/08/18 1019 07/10/18 0516  WBC  10.2 10.1 8.1  HGB 11.4* 10.3* 9.5*  HCT 35.7* 32.4* 30.1*  MCV 101.7* 103.5* 103.1*  PLT 247 157 998*   Basic Metabolic Panel: Recent Labs  Lab 07/04/18 0802 07/06/18 0345 07/07/18 0349 07/10/18 0516  NA 137 138 139 140  K 4.5 4.4 4.0 3.2*  CL 108 110 110 115*  CO2 21* 21* 22 20*  GLUCOSE 134* 124* 104* 84  BUN 42* 41* 38* 25*  CREATININE 1.16 1.04 0.95 0.82  CALCIUM 7.3* 6.8* 6.7* 5.6*  MG  --  2.0  --   --    GFR: Estimated Creatinine Clearance: 94.7 mL/min (by C-G formula based on SCr of 0.82 mg/dL). Liver Function Tests: Recent Labs  Lab 07/04/18 0802 07/07/18 0349  AST 51* 47*  ALT 88* 80*  ALKPHOS 459* 414*  BILITOT 1.0 1.0  PROT 6.0* 5.4*  ALBUMIN 2.9* 2.8*   No results for input(s): LIPASE, AMYLASE in the last 168 hours. No results for input(s): AMMONIA in the last 168 hours. Coagulation Profile: No results for input(s): INR, PROTIME in the last 168 hours. Cardiac Enzymes: No results for input(s): CKTOTAL, CKMB, CKMBINDEX, TROPONINI in the last 168 hours. BNP (last 3 results) No results for input(s): PROBNP in the last 8760 hours. HbA1C: No results for input(s): HGBA1C in the last 72 hours. CBG: Recent Labs  Lab 07/09/18 0732 07/09/18 1156 07/09/18 1630 07/09/18 2155 07/10/18 0748  GLUCAP 90 128* 154* 144* 72   Lipid Profile: No results for input(s): CHOL, HDL, LDLCALC, TRIG, CHOLHDL, LDLDIRECT in the last 72 hours. Thyroid Function Tests: No results for input(s): TSH, T4TOTAL, FREET4, T3FREE, THYROIDAB in the last 72 hours. Anemia Panel: No results for input(s): VITAMINB12, FOLATE, FERRITIN, TIBC, IRON, RETICCTPCT in the last 72 hours. Sepsis Labs: No results for input(s): PROCALCITON, LATICACIDVEN in the last 168 hours.  No results found for this or any previous visit (from the past 240 hour(s)).   RN Pressure Injury Documentation:     Estimated body mass index is 33.29 kg/m as calculated from the following:   Height as of this  encounter: 6\' 2"  (1.88 m).   Weight as of this encounter: 117.6 kg.  Malnutrition Type:  Nutrition Problem: Increased nutrient needs Etiology: post-op healing   Malnutrition Characteristics:  Signs/Symptoms: estimated needs   Nutrition Interventions:  Interventions: MVI, Prostat, Magic cup, Boost Plus   Radiology Studies: No results found.  Scheduled Meds:  dexamethasone  6 mg Oral Daily   docusate sodium  100 mg Oral BID   enoxaparin (LOVENOX) injection  1 mg/kg Subcutaneous Q12H   feeding supplement (ENSURE ENLIVE)  237 mL Oral BID BM   feeding supplement (PRO-STAT SUGAR FREE 64)  30 mL Oral BID WC   fentaNYL  1 patch Transdermal Q72H   HYDROmorphone  4 mg Oral Q8H   insulin aspart  0-15 Units Subcutaneous TID WC   insulin glargine  10 Units Subcutaneous QHS   [START ON  07/11/2018] lactulose  10 g Oral QODAY   metoprolol succinate  50 mg Oral Daily   multivitamin with minerals  1 tablet Oral Daily   oxybutynin  5 mg Oral BID   pantoprazole  40 mg Oral BID   potassium chloride  40 mEq Oral Once   senna  2 tablet Oral QHS   sodium bicarbonate  1,300 mg Oral BID   sodium chloride flush  10-40 mL Intracatheter Q12H   tamsulosin  0.4 mg Oral Daily   Continuous Infusions:  sodium chloride 45 mL/hr at 07/08/18 1435   calcium gluconate      LOS: 19 days   Kerney Elbe, DO [facility] PAGER is on AMION  If 7PM-7AM, please contact night-coverage [email or web address] Password [facility] 07/10/2018, 8:37 AM

## 2018-07-10 NOTE — Progress Notes (Signed)
Nutrition Follow-up  INTERVENTION:   -Continue Ensure Enlive po BID, each supplement provides 350 kcal and 20 grams of protein -Continue Prostat liquid protein PO 30 ml BID with meals, each supplement provides 100 kcal, 15 grams protein. -Continue MVI with minerals daily -Continue Magic Cup TID with meals, each supplement provides 290 kcals and 9 grams protein  NUTRITION DIAGNOSIS:   Increased nutrient needs related to post-op healing as evidenced by estimated needs.  Ongoing.  GOAL:   Patient will meet greater than or equal to 90% of their needs  Progressing.  MONITOR:   PO intake, Supplement acceptance, Labs, Weight trends, I & O's, Skin  ASSESSMENT:   85 year old male lives alone is admitted for suspected postoperative lumbar spine infection s/p surgery for severe stenosis at L3-4  4/7-CT scan revealed liver lesions, 3 liver tumors, and large mass next pelvis; antibiotics stopped 4/9- s/p CT core lesion biopsy of lytic rt pelvic bone lesion CT revealed high-grade carcinoma with unknown primary origin 4/14- Patient transferred from [facility] to [facility] for initiation of radiation therapy  **RD working remotely**  Patient is currently consuming 50-100% of meals. Pt's appetite has improved. Pt is drinking Ensure supplements with no issue. Pt is taking Prostat inconsistently. Pt is receiving radiation treatment this afternoon.  Per weight records, pt's weight is increasing, now +36 lb since admission 4/3.  Labs reviewed: CBGs: 72-85 Low K, Mg, Phos  Diet Order:   Diet Order            Diet regular Room service appropriate? Yes; Fluid consistency: Thin  Diet effective now              EDUCATION NEEDS:   Not appropriate for education at this time  Skin:  Skin Assessment: Skin Integrity Issues: Skin Integrity Issues:: Incisions Incisions: posterior pelvis  Last BM:  4/21  Height:   Ht Readings from Last 1 Encounters:  06/21/18 6\' 2"  (1.88 m)    Weight:   Wt  Readings from Last 1 Encounters:  07/09/18 117.6 kg    Ideal Body Weight:  86.3 kg  BMI:  Body mass index is 33.29 kg/m.  Estimated Nutritional Needs:   Kcal:  2000-2300kcal/day   Protein:  100-120g/day   Fluid:  >2.1L/day   Clayton Bibles, MS, RD, LDN [place] [facility] Pager: [phone] After Hours Pager: [phone]

## 2018-07-10 NOTE — Addendum Note (Signed)
Encounter addended by: Martie Round, RN on: 07/10/2018 11:38 PM  Actions taken: [facility] administration accepted, Flowsheet accepted

## 2018-07-11 ENCOUNTER — Ambulatory Visit
Admit: 2018-07-11 | Discharge: 2018-07-11 | Disposition: A | Discharge status: Home / Self Care | Payer: Medicare Other | Attending: Radiation Oncology | Admitting: Radiation Oncology

## 2018-07-11 LAB — FOLATE: Folate: 11.9 ng/mL (ref >5.9)

## 2018-07-11 LAB — CBC WITH DIFFERENTIAL/PLATELET
Abs Immature Granulocytes: 0.11 10*3/uL — ABNORMAL HIGH (ref 0.00–0.07)
Basophils Absolute: 0 10*3/uL (ref 0.0–0.1)
Basophils Relative: 0 %
Eosinophils Absolute: 0 10*3/uL (ref 0.0–0.5)
Eosinophils Relative: 0 %
HCT: 33.9 % — ABNORMAL LOW (ref 39.0–52.0)
Hemoglobin: 10.9 g/dL — ABNORMAL LOW (ref 13.0–17.0)
Immature Granulocytes: 1 %
Lymphocytes Relative: 10 %
Lymphs Abs: 0.9 10*3/uL (ref 0.7–4.0)
MCH: 33 pg (ref 26.0–34.0)
MCHC: 32.2 g/dL (ref 30.0–36.0)
MCV: 102.7 fL — ABNORMAL HIGH (ref 80.0–100.0)
Monocytes Absolute: 0.3 10*3/uL (ref 0.1–1.0)
Monocytes Relative: 4 %
Neutro Abs: 7.5 10*3/uL (ref 1.7–7.7)
Neutrophils Relative %: 85 %
Platelets: 137 10*3/uL — ABNORMAL LOW (ref 150–400)
RBC: 3.3 MIL/uL — ABNORMAL LOW (ref 4.22–5.81)
RDW: 16.2 % — ABNORMAL HIGH (ref 11.5–15.5)
WBC: 8.8 10*3/uL (ref 4.0–10.5)
nRBC: 0 % (ref 0.0–0.2)

## 2018-07-11 LAB — PHOSPHORUS: Phosphorus: 2.2 mg/dL — ABNORMAL LOW (ref 2.5–4.6)

## 2018-07-11 LAB — COMPREHENSIVE METABOLIC PANEL
ALT: 103 U/L — ABNORMAL HIGH (ref 0–44)
AST: 63 U/L — ABNORMAL HIGH (ref 15–41)
Albumin: 2.7 g/dL — ABNORMAL LOW (ref 3.5–5.0)
Alkaline Phosphatase: 427 U/L — ABNORMAL HIGH (ref 38–126)
Anion gap: 9 (ref 5–15)
BUN: 28 mg/dL — ABNORMAL HIGH (ref 8–23)
CO2: 19 mmol/L — ABNORMAL LOW (ref 22–32)
Calcium: 6.1 mg/dL — CL (ref 8.9–10.3)
Chloride: 109 mmol/L (ref 98–111)
Creatinine, Ser: 0.85 mg/dL (ref 0.61–1.24)
GFR calc Af Amer: >60 mL/min (ref >60)
GFR calc non Af Amer: >60 mL/min (ref >60)
Glucose, Bld: 102 mg/dL — ABNORMAL HIGH (ref 70–99)
Potassium: 3.7 mmol/L (ref 3.5–5.1)
Sodium: 137 mmol/L (ref 135–145)
Total Bilirubin: 1 mg/dL (ref 0.3–1.2)
Total Protein: 5.4 g/dL — ABNORMAL LOW (ref 6.5–8.1)

## 2018-07-11 LAB — GLUCOSE, CAPILLARY
Glucose-Capillary: 124 mg/dL — ABNORMAL HIGH (ref 70–99)
Glucose-Capillary: 141 mg/dL — ABNORMAL HIGH (ref 70–99)
Glucose-Capillary: 120 mg/dL — ABNORMAL HIGH (ref 70–99)

## 2018-07-11 LAB — IRON AND TIBC
Iron: 61 ug/dL (ref 45–182)
Saturation Ratios: 22 % (ref 17.9–39.5)
TIBC: 272 ug/dL (ref 250–450)
UIBC: 211 ug/dL

## 2018-07-11 LAB — RETICULOCYTES
Immature Retic Fract: 17.7 % — ABNORMAL HIGH (ref 2.3–15.9)
RBC.: 3.3 MIL/uL — ABNORMAL LOW (ref 4.22–5.81)
Retic Count, Absolute: 55.4 10*3/uL (ref 19.0–186.0)
Retic Ct Pct: 1.7 % (ref 0.4–3.1)

## 2018-07-11 LAB — FERRITIN: Ferritin: 269 ng/mL (ref 24–336)

## 2018-07-11 LAB — VITAMIN B12: Vitamin B-12: 1473 pg/mL — ABNORMAL HIGH (ref 180–914)

## 2018-07-11 LAB — MAGNESIUM: Magnesium: 1.8 mg/dL (ref 1.7–2.4)

## 2018-07-11 LAB — LACTATE DEHYDROGENASE: LDH: 252 U/L — ABNORMAL HIGH (ref 98–192)

## 2018-07-11 MED ORDER — DEXAMETHASONE 4 MG PO TABS
4.0000 mg | ORAL_TABLET | Freq: Every day | ORAL | Status: DC
  Administered 2018-07-12 – 2018-07-19 (×8): 4 mg via ORAL
  Filled 2018-07-11 (×8): qty 1

## 2018-07-11 MED ORDER — K PHOS MONO-SOD PHOS DI & MONO 155-852-130 MG PO TABS
500.0000 mg | ORAL_TABLET | Freq: Once | ORAL | Status: AC
  Administered 2018-07-11: 09:00:00 500 mg via ORAL
  Filled 2018-07-11: qty 2

## 2018-07-11 MED ORDER — SENNA 8.6 MG PO TABS
1.0000 | ORAL_TABLET | Freq: Every day | ORAL | Status: DC
  Administered 2018-07-12: 22:00:00 8.6 mg via ORAL
  Filled 2018-07-11 (×3): qty 1

## 2018-07-11 MED ORDER — HYDROMORPHONE HCL 1 MG/ML IJ SOLN
0.5000 mg | INTRAMUSCULAR | Status: DC | PRN
  Administered 2018-07-11 – 2018-07-12 (×3): 0.5 mg via INTRAVENOUS
  Filled 2018-07-11 (×3): qty 0.5

## 2018-07-11 MED ORDER — HYDROMORPHONE HCL 2 MG PO TABS
4.0000 mg | ORAL_TABLET | ORAL | Status: DC | PRN
  Administered 2018-07-11 – 2018-07-14 (×15): 4 mg via ORAL
  Filled 2018-07-11 (×16): qty 2

## 2018-07-11 MED ORDER — CALCIUM GLUCONATE-NACL 1-0.675 GM/50ML-% IV SOLN
1.0000 g | Freq: Once | INTRAVENOUS | Status: AC
  Administered 2018-07-11: 09:00:00 1000 mg via INTRAVENOUS
  Filled 2018-07-11: qty 50

## 2018-07-11 NOTE — Progress Notes (Signed)
OT Cancellation Note  Patient Details Name: Lucas Morales MRN: [id number] DOB: 1933/07/17   Cancelled Treatment:    Reason Eval/Treat Not Completed: Other (comment). Pt in too much pain after returning to bed from 3:1 commode.  Pt is not currently wearing sling for scrotom.  Pt said it got soiled and was thrown out.  There is a second one I washed, and this is in his closet. He could not tolerate having it put on.  Will check back tomorrow as schedule permits.  Tayvion Lauder 07/11/2018, 3:15 PM  Lesle Chris, OTR/L Acute Rehabilitation Services [phone] [facility] pager [phone] office 07/11/2018

## 2018-07-11 NOTE — Progress Notes (Signed)
PT Cancellation Note  Patient Details Name: Lucas Morales MRN: [id number] DOB: 10-07-1933   Cancelled Treatment:     PT attempted x 3 but deferred at pt request 2* fatigue and on last attempt pt in transit for radiation.  Will follow.   Synethia Endicott 07/11/2018, 12:17 PM

## 2018-07-11 NOTE — Progress Notes (Signed)
Daily Progress Note   Patient Name: Lucas Morales       Date: 10/31/4816 DOB: [birth year]  Age: 85 y.o. MRN#: [id number] Attending Physician: Kerney Elbe, DO Primary Care Physician: Deland Pretty, MD Admit Date: 06/21/2018  Reason for Consultation/Follow-up: Non pain symptom management, Pain control and Psychosocial/spiritual support  Subjective: Patient reports 3 liquid bowel movements in the last 18 hours.  He also complains of a sense of disorientation this morning.  Whe he woke he did not know where he was or why.   He asked me to re-explain what Dr. Marin Olp told him.  I explained that due to not having a good target gene mutation he is not a candidate for immunotherapy.  Once we determine what type of tumor he has Dr. Marin Olp will make decisions about whether or not there are good chemotherapy options for him.  We discussed his pain.  The back pain (cancer pain) seems controlled on the current regimen.  His difficulty with pain comes from pain in the tip of his penis.  Both the oral and the IV medications seem to ease that off some, but he still does not have real relief from it.      Assessment: Over the last few days he has been relatively consistent with usage of pain medication.  Fentanyl 25 mcg (60 morphine eq), Oral dilaudid 4 mg TID (60 morphine eq), and IV dilaudid 1 mg PRN used 4x (60 morphine eq) for a total of 180 morphine eq.     Patient Profile/HPI:  85 y.o. male  with past medical history of CAD, Afib, alpha 1 antitrypsin deficiency, interstitial cystitis (with a tendency to have urinary retention), DM, depression and anxiety who was admitted on 06/21/2018 with what was thought to be a post op infection at L3-L4.  Work up has revealed widespread (lungs, liver, pelvis,  bone) metastatic cancer of undetermined primary.  Lucas Morales is going for a biopsy this morning.   PMT has been asked to consult for symptom management.    Length of Stay: 20  Current Medications: Scheduled Meds:  . apixaban  10 mg Oral BID   Followed by  . [START ON 07/17/2018] apixaban  5 mg Oral BID  . dexamethasone  6 mg Oral Daily  . docusate sodium  100 mg Oral BID  .  feeding supplement (ENSURE ENLIVE)  237 mL Oral BID BM  . feeding supplement (PRO-STAT SUGAR FREE 64)  30 mL Oral BID WC  . fentaNYL  1 patch Transdermal Q72H  . HYDROmorphone  4 mg Oral Q8H  . insulin aspart  0-15 Units Subcutaneous TID WC  . insulin glargine  10 Units Subcutaneous QHS  . lactulose  10 g Oral QODAY  . lidocaine  1 application Topical Once  . metoprolol succinate  50 mg Oral Daily  . multivitamin with minerals  1 tablet Oral Daily  . oxybutynin  5 mg Oral BID  . pantoprazole  40 mg Oral BID  . senna  2 tablet Oral QHS  . sodium bicarbonate  1,300 mg Oral BID  . sodium chloride flush  10-40 mL Intracatheter Q12H  . sodium chloride flush  3 mL Intravenous Q12H  . tamsulosin  0.4 mg Oral Daily    Continuous Infusions: . sodium chloride      PRN Meds: sodium chloride, acetaminophen, alum & mag hydroxide-simeth, bisacodyl, HYDROmorphone (DILAUDID) injection, ibuprofen, LORazepam, menthol-cetylpyridinium, neomycin-bacitracin-polymyxin, ondansetron **OR** ondansetron (ZOFRAN) IV, opium-belladonna, sodium chloride flush, sodium chloride flush  Physical Exam        Well developed, awake, alert, appropriate, NAD CV slightly irreg rhythm but regular rate, no m/r/g Resp no distress.  No w/c/r Abdomen soft, nd, nt, +bs.  Penis swollen but no redness or drainage around the foley at the meatus.  Vital Signs: BP 115/71 (BP Location: Right Arm)   Pulse 92   Temp 97.9 F (36.6 C) (Oral)   Resp 19   Ht 6\' 2"  (1.88 m)   Wt 117.6 kg   SpO2 99%   BMI 33.29 kg/m  SpO2: SpO2: 99 % O2 Device: O2  Device: Room Air O2 Flow Rate: O2 Flow Rate (L/min): 2 L/min  Intake/output summary:   Intake/Output Summary (Last 24 hours) at 07/11/2018 1157 Last data filed at 07/11/2018 0531 Gross per 24 hour  Intake 10 ml  Output 1000 ml  Net -990 ml   LBM: Last BM Date: 07/09/18 Baseline Weight: Weight: 101.3 kg Most recent weight: Weight: 117.6 kg       Palliative Assessment/Data:  50%    Flowsheet Rows     Most Recent Value  Intake Tab  Referral Department  Oncology  Unit at Time of Referral  Med/Surg Unit  Palliative Care Primary Diagnosis  Cancer  Date Notified  06/26/18  Palliative Care Type  New Palliative care  Reason for referral  Non-pain Symptom  Date of Admission  06/21/18  Date first seen by Palliative Care  06/27/18  # of days Palliative referral response time  1 Day(s)  # of days IP prior to Palliative referral  5  Clinical Assessment  Psychosocial & Spiritual Assessment  Palliative Care Outcomes      Patient Active Problem List   Diagnosis Date Noted  . Lung nodules 07/03/2018  . Bladder spasm   . Metastatic cancer ([place])   . Pelvic mass   . Type II diabetes mellitus ([place]) 06/27/2018  . Coronary artery disease 06/27/2018  . AKI (acute kidney injury) ([place]) 06/27/2018  . Cancer associated pain   . DNR (do not resuscitate)   . Palliative care encounter   . Deep venous thrombosis ([place]) 06/26/2018  . 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 Status post lumbar spine surgery for decompression of spinal cord 05/24/2018  . Abnormal EKG 05/25/2017  . S/P cervical spinal fusion 01/25/2016  .  Cervical spinal stenosis 12/20/2015  . Cervical spondylosis 12/20/2015  . Painful orthopaedic hardware left hip with chronic trochanteric bursitis 07/30/2015  . Hip bursitis 07/30/2015  . Trochanteric bursitis of left hip 07/30/2015  . Chest pain with moderate risk of acute coronary syndrome 10/19/2014  . Macular hole, left eye 09/01/2014  . Macular hole of left eye  08/28/2014  . Other pancytopenia ([place])   . Sliding hiatal hernia   . Gastric erosions   . GI bleeding 06/04/2014  . Acute GI bleeding 06/04/2014  . Orthostatic hypotension 06/04/2014  . Acute upper gastrointestinal bleeding 06/04/2014  . Other specified diabetes mellitus without complications ([place])   . Acute blood loss anemia   . Thrombocytopenia ([place])   . Closed left hip fracture ([place]) 04/03/2014  . Fracture, femur ([place]) 04/03/2014  . Femur fracture ([place]) 04/03/2014  . Upper airway cough syndrome 05/16/2013  . OSA (obstructive sleep apnea) 10/21/2012  . Macular hole 10/08/2012  . Preoperative clearance 09/30/2012  . PAF (paroxysmal atrial fibrillation) ([place]) 01/21/2012  . Shortness of breath on exertion 01/11/2012  . Bradycardia, sinus 01/11/2012  . Hx of CABG 01/11/2012  . Essential hypertension   . Dyslipidemia   . ABDOMINAL PAIN-LLQ 02/04/2010  . HEMORRHOIDS 08/31/2008  . ESOPHAGEAL STRICTURE 08/31/2008  . GERD 08/31/2008  . Gastritis and gastroduodenitis 08/31/2008  . HIATAL HERNIA 08/31/2008  . DIVERTICULOSIS, COLON 08/31/2008  . Chronic interstitial cystitis 08/31/2008  . COLONIC POLYPS, ADENOMATOUS, HX OF 08/31/2008    Palliative Care Plan    Recommendations/Plan:  Will decrease IV dilaudid to 0.5 mg while adding an additional PRN dose of oral dilaudid q 4 hours.  Lucas Morales and I discussed taking the oral pain medication before ambulation or radiation.  DC lactulose due to liquid bowel movements.  I also wonder if radiation could be contributing to diarrhea.  Will decrease senna to 1 tab qhs.  Will decrease oral dexamethasone to 4 mg daily.  Please place a thigh strap to stabilize foley cath when he is out of bed.  PMT will continue to check in intermittently for symptom management.  Goals of Care and Additional Recommendations:  Limitations on Scope of Treatment: Full Scope Treatment  Code Status:  DNR  Prognosis:   < 6 months due to high grade  widely metastatic carcinoma   Discharge Planning:  To his home he will have 24 hour family care.  He will need home health services if he opts for further oncologic therapy OR hospice services in the home if he opts for supportive care (no chemo or IV treatments).  Care plan was discussed with patient, PMT attending, bedside RN  Thank you for allowing the Palliative Medicine Team to assist in the care of this patient.  Total time spent:  35 min.     Greater than 50%  of this time was spent counseling and coordinating care related to the above assessment and plan.  Florentina Jenny, PA-C Palliative Medicine  Please contact Palliative MedicineTeam phone at [phone] for questions and concerns between 7 am - 7 pm.   Please see AMION for individual provider pager numbers.

## 2018-07-11 NOTE — Addendum Note (Signed)
Encounter addended by: Martie Round, RN on: 07/11/2018 6:02 AM  Actions taken: [facility] administration accepted, Flowsheet accepted

## 2018-07-11 NOTE — Addendum Note (Signed)
Encounter addended by: Martyn Malay, RN on: 07/11/2018 12:50 PM  Actions taken: [facility] administration accepted

## 2018-07-11 NOTE — Progress Notes (Signed)
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  He is tolerating radiation therapy pretty well.  I talked him about the molecular genetic studies.  I told him that unfortunately, there really is nothing that we can target by the results that we got back.  In addition, I do still think that immunotherapy is going to be effective.  I think the only option that we have is going to be chemotherapy.  I talked to his daughter on the phone yesterday.  They are interested in a second opinion.  I will arrange for this once he finishes his radiation treatments.  I think this would be worthwhile.  We still are awaiting the molecular array analysis which hopefully can tell us the origin of this tumor.  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  He is now on Eliquis.  His platelet count was dropping.  We switched him over to Eliquis.  His platelet count is now 137,000.  His chemical studies seem to show everything holding steady.  His LFTs are slightly elevated.  LDH is up a little bit.  He is eating a little bit.  He is having no nausea or vomiting.  He said he had a good bowel movement last night.  He has had no bleeding.  He still has a scrotal swelling which likely will not improve until this pelvic mass improves.  Overall, there really is no change on his physical exam.  He still has atrial fibrillation but the rate is well controlled.  His lungs sound clear.  I see no oral thrush.  His abdomen is soft.  Bowel sounds are slightly decreased but present.  He has no obvious abdominal mass.  There is no guarding or rebound tenderness.  Extremities shows some slight nonpitting edema bilaterally.  He has marked scrotal swelling.  He has a Foley catheter that is in.   Lattie Haw, MD  [place] 15:13

## 2018-07-11 NOTE — Progress Notes (Signed)
PROGRESS NOTE    Lucas Morales  NKN:[id number] DOB: June 02, 1933 DOA: 06/21/2018 PCP: Deland Pretty, MD   Brief Narrative: Lucas Morales is a 85 y.o. malewith medical history significant of adenomatous polyps, alpha-1 antitrypsin deficiency carrier, anemia, anxiety, depression, osteoarthritis of the knees and back, easy bruising, chronic bronchitis, chronic lower back pain, CAD, history of frequent diarrhea due to partial colectomy, diverticulosis, episodes of diverticulitis, hemorrhoids, gastritis, hiatal hernia, GERD, esophageal stricture, esophageal dysmotility, history of post CABG paroxysmal A. fib, history of bradycardia, hyperlipidemia, hypertension, OSA not on CPAP due to intolerance, restless leg syndrome, sciatic nerve pain, type 2 diabetes, urinary frequency whohad lumbar decompression L3-4 on 05/10/2018 and was admitted about a week ago due to suspected L3 vertebral body osteomyelitis. However, after reviewing all imaging and getting new imaging studies, it was determined that the patient had a metastatic lesion. 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 He was seen by oncology 3 days ago. He underwent biopsy of the area with interventional radiology. We are seeinghimconsultdue to AKI, worsening LFTs and multiple medical issues.   Developed AKI: Suspect multifactorial secondary to contrast and dehydration. Patient was transferred from [facility] to [facility] on 4/14 to start radiation treatment to pelvis mass.  **Interim History He started radiation treatments and is on day 5 of 10.  Continues to have significant scrotal swelling and feels uncomfortable ambulating with it.  Had severe electrolyte abnormalities that were replete. Molecular Genetic Studies done and had nothing that is targetable but is undergoing mollecular array analysis that may delineate origination of tumor.  He is wanting a second opinion and Dr. Marin Morales is to arrange this once his radiation  treatments are finished  Assessment & Plan:   Principal Problem:   Pelvic mass Active Problems:   Essential hypertension   Dyslipidemia   PAF (paroxysmal atrial fibrillation) (HCC)   OSA (obstructive sleep apnea)   Deep venous thrombosis (HCC)   Cancer associated pain   DNR (do not resuscitate)   Type II diabetes mellitus ([place])   Coronary artery disease   AKI (acute kidney injury) ([place])   Metastatic cancer ([place])   Lung nodules   Bladder spasm  Pelvic mass with Metastasis  Multiple liver masses Multiple lung nodules -Patient initially admitted following lumbar decompression L3-4 on 05/10/2018 with suspected L3 osteomyelitis.   -CT pelvis notable for a 13 x 10 x 10 cm mass arising from the right ischium extending into the posterior aspect of the right acetabulum and adjacent soft tissues. 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 -Further molecular studies per oncology which came back today and per Oncology unfortunately they have nothing that they can target and feel like he will need Chemotherapy; Molecular Array Analysis is still pending  -Differential for primary lesion include lung primary vs pelvic sarcoma vs cholangiocarcinoma and Medical Oncology leaning towards Cholangiocarcinoma -MR brain negative for metastatic process -Oncology believes this is an extremely aggressive tumor with prognosis 2-3 months -continue XRT; completed #5/10 fractions; Last day planned 07/17/2018 -Continued treatment dose Lovenox for hypercoagulable state given malignancy but changed to Apixaban given thrombocytopenia and he is tolerating Apixaban now -Family wanting a second opinion and Dr. Marin Morales to arrange once Radiation Treatments are finished    Pain of malignancy Patient states pain fairly  well controlled at this moment. -Continue pain control with fentanyl patch 26mcg -C/w Dilaudid 4 mg q8hr and Decadron 8 mg p.o. daily -C/w Dilaudid IV 0.5-1 mg prn for breakthrough pain -C/w Palliative care assisting with pain management  Acute renal failure, improved and resolved resolved -Creatinine elevated to a high of 1.94 during hospitalization. -Etiology likely secondary to decreased oral intake and contrast exposure from CT abdomen/pelvis as well as urinary retention. -Renal ultrasound unrevealing. -Creatinine improving 1.94-->1.33-->1.16-->1.04-->0.95 and is now 0.85 (0.96 on admission) -Avoid nephrotoxins, renally dose all medications -Continue Foley catheter until edema improves; then will attempt voiding trial -Discontinued IVF now  -Continue to monitor renal function daily  Acute urinary retention and Massive Scrotal Swelling -Etiology likely secondary to edema and pelvic mass with compression. -Continue tamsulosin 0.4 mg p.o. daily -Continue Foley catheter until swelling improves then will attempt voiding trial -C/w Lidocaine, Oxybutynin, Opioids, and Belladonna Suppositories  L3 lesion -Likely metastatic disease and not consistent with infection, -Infectious disease initially consulted during hospitalization, antibiotics have subsequently discontinued.  Hypertension -C/w Metoprolol succinate 50 mg p.o. daily  HLD -Continue holding statin due to abnormal LFTs.  Paroxysmal A. Fib: -Continue metoprolol succinate 50 mg p.o. daily -Was on Anticoagulation with Full Dose Lovenox and will change to po Apixaban  OSA -Not on CPAP. Continue with oxygen supplementation prn  Acute DVT, right soleal vein:  -US duplex doppler with findings consistent with acute deep vein thrombosis involving the right soleal vein. -Changed to Eliquis yesterday   Type 2 Diabetes  -C/w Lantus 10 units [place] qHS -C/w Moderate Novolog  SSI AC  -Continue to Monitor Glucose closely while on Decadron -CBG's ranging from 72-167  Coronary Artery Disease -Continue beta-blocker with Metoprolol Succinate 50 mg po Daily   Macrocytic Anemia -Patient's Hb/Hct went from 11.4/35.7 -> 10.3/32.4 -> 9.5/30.1 -> 10.9/33.9 -Continue to Monitor for S/Sx of Bleeding  -Checked Anemia Panel and showed iron level 61, U IBC of 211, TIBC of 272, saturation of 22%, ferritin level 269, folate level 11.9, vitamin B12 level of 1473 -Repeat CBC in AM   Abnormal LFT's/Transaminitis, slightly worsened  -Suspect related to liver masses -AST is now 63 (51) and ALT is now 103 (83) -Continue to Monitor and Trend Hepatic Function -Repeat CMP in AM   Hyponatremia -Improved with IV fluids and is now 137 -Discontinued IV Maintenance Fluids at 45 mL/hr -Continue to Monitor and Trend Na+ and repeat CMP in AM.  Hypokalemia, improved  -Patient's K+ was 3.7 -Continue to monitor and replete as necessary -Repeat CMP in the a.m.  Hypocalcemia -Patient's Ca2+ was 6.1 -Replete with IV Calcium Gluconate 1 gram again  -Continue to Monitor and Replete as Necessary  -Repeat Ca2+ in AM   Hypomagnesemia -Patient magnesium level this morning was 1.8 -Replete with IV mag sulfate 3 g yesterday  -Continue monitor and replete as necessary -Repeat magnesium level in the a.m.  Hypophosphatemia -Patient's phosphorus levels morning was 2.2 -Replete with p.o. K-Phos Neutral 500 mg twice daily x2 doses -Continue monitor replete as necessary -Repeat phosphorus level in the a.m.  Metabolic Acidosis -Continue Sodium Bicarbonate 1300mg  PO BID -CO2 is now 19 andd AG is 9  Thrombocytopenia  -Was Worsening but improved from yesterday  -Platelet Count has been dropping and has dropped over the course of the last 5-10 days -Has been on Full Dose Lovenox -Concern for HIT so will send Ab; Pharmacy Notified and will switch Anticoagulation to Eliquis -Platelet Count  is now improved from yesterday and  is 137,000 -Continue to Monitor for S/Sx of Bleeding -Repeat CBC in AM  DVT prophylaxis: Anticoagulated with Apixaban   Code Status: DO NOT RESUSCITATE Family Communication: No family at bedside and attempted to update daughter via Telephone but she did not answer Disposition Plan: Continue Radiation Treatment and PT recommends SNF versus home health; will likely remain inpatient for radiation treatments with planned completion on 07/17/2018  Consultants:   Palliative Care Medicine   Medical Oncology   Radiation Oncology  Orthopedic Surgery  Interventional Radiology   Procedures:  CT guided biopsy lytic right pelvic bone lesion on 06/27/2018 by IR  Antimicrobials:  Anti-infectives (From admission, onward)   Start     Dose/Rate Route Frequency Ordered Stop   06/27/18 1000  fluconazole (DIFLUCAN) tablet 100 mg  Status:  Discontinued     100 mg Oral Daily 06/27/18 0625 07/05/18 0944   06/24/18 0700  vancomycin (VANCOCIN) 1,250 mg in sodium chloride 0.9 % 250 mL IVPB  Status:  Discontinued     1,250 mg 166.7 mL/hr over 90 Minutes Intravenous Every 12 hours 06/23/18 1916 06/25/18 0815   06/22/18 1300  cefTRIAXone (ROCEPHIN) 2 g in sodium chloride 0.9 % 100 mL IVPB  Status:  Discontinued     2 g 200 mL/hr over 30 Minutes Intravenous Every 24 hours 06/22/18 1217 06/25/18 0758   06/22/18 0700  vancomycin (VANCOCIN) 1,000 mg in sodium chloride 0.9 % 250 mL IVPB  Status:  Discontinued     1,000 mg 250 mL/hr over 60 Minutes Intravenous Every 12 hours 06/21/18 1813 06/23/18 1916   06/22/18 0300  piperacillin-tazobactam (ZOSYN) IVPB 3.375 g  Status:  Discontinued     3.375 g 12.5 mL/hr over 240 Minutes Intravenous Every 8 hours 06/21/18 1813 06/22/18 1217   06/21/18 1700  piperacillin-tazobactam (ZOSYN) IVPB 3.375 g     3.375 g 100 mL/hr over 30 Minutes Intravenous  Once 06/21/18 1650 06/21/18 1847   06/21/18 1700  vancomycin (VANCOCIN) 2,000 mg in  sodium chloride 0.9 % 500 mL IVPB     2,000 mg 250 mL/hr over 120 Minutes Intravenous  Once 06/21/18 1658 06/21/18 2200     Subjective: Seen and examined at bedside and that he felt discouraged.  Denied any chest pain, lightheadedness or dizziness but states that his scrotum was still very swollen.  Hoping to get better news the molecular array analysis.  His family wants a second opinion and Dr. Marin Morales is to arrange this once he is completed his radiation.  Denies any lightheadedness or dizziness.  No other concerns or complaints this time but states that caring a lot of swelling in his scrotum makes it difficult for him to ambulate.  Objective: Vitals:   07/08/18 1100 07/09/18 0800 07/10/18 2029 07/11/18 0530  BP:   140/85 115/71  Pulse:   84 92  Resp:   18 19  Temp:   97.8 F (36.6 C) 97.9 F (36.6 C)  TempSrc:   Oral Oral  SpO2:   100% 99%  Weight: 119.6 kg 117.6 kg    Height:        Intake/Output Summary (Last 24 hours) at 07/11/2018 1251 Last data filed at 07/11/2018 0531 Gross per 24 hour  Intake 10 ml  Output 1000 ml  Net -990 ml   Filed Weights   07/05/18 1133 07/08/18 1100 07/09/18 0800  Weight: 112 kg 119.6 kg 117.6 kg   Examination: Physical Exam:  Constitutional: Well-nourished, well-developed obese elderly Caucasian male who appears discouraged but  is in no acute distress.  Appears slightly uncomfortable Eyes: Lids and conjunctive are normal.  Sclera anicteric ENMT: External ears and nose appear normal.  Mucous members are moist Neck: Appears supple no JVD Respiratory: Slightly diminished auscultation bilaterally no appreciable wheezing, rales, rhonchi.  Patient not tachypneic or using accessory muscles to breathe Cardiovascular: Irregularly irregular but rate controlled.  Has 2-3+ lower extremity edema and wearing TED hose Abdomen: Soft, nontender, not as distended as yesterday but still distended secondary body habitus.  Bowel sounds present GU: Deferred but  has massive scrotal swelling and penile swelling with an indwelling Foley catheter in place Musculoskeletal: No contractures or cyanosis.  No joint deformities in upper extremities. Skin: No appreciable rashes or lesions limited skin evaluation. Neurologic: Cranial nerves II through XII gross intact no appreciable focal deficits.  Romberg sign cerebellar reflexes were not assessed. Psychiatric: Slightly anxious and depressed appearing.  Is awake, alert and oriented x3.  Data Reviewed: I have personally reviewed following labs and imaging studies  CBC: Recent Labs  Lab 07/05/18 0432 07/08/18 1019 07/10/18 0516 07/11/18 0527  WBC 10.2 10.1 8.1 8.8  NEUTROABS  --   --   --  7.5  HGB 11.4* 10.3* 9.5* 10.9*  HCT 35.7* 32.4* 30.1* 33.9*  MCV 101.7* 103.5* 103.1* 102.7*  PLT 247 157 119* 737*   Basic Metabolic Panel: Recent Labs  Lab 07/06/18 0345 07/07/18 0349 07/10/18 0516 07/11/18 0527  NA 138 139 140 137  K 4.4 4.0 3.2* 3.7  CL 110 110 115* 109  CO2 21* 22 20* 19*  GLUCOSE 124* 104* 84 102*  BUN 41* 38* 25* 28*  CREATININE 1.04 0.95 0.82 0.85  CALCIUM 6.8* 6.7* 5.6* 6.1*  MG 2.0  --  1.3* 1.8  PHOS  --   --  2.1* 2.2*   GFR: Estimated Creatinine Clearance: 91.4 mL/min (by C-G formula based on SCr of 0.85 mg/dL). Liver Function Tests: Recent Labs  Lab 07/07/18 0349 07/10/18 0516 07/11/18 0527  AST 47* 51* 63*  ALT 80* 83* 103*  ALKPHOS 414* 306* 427*  BILITOT 1.0 0.6 1.0  PROT 5.4* 4.4* 5.4*  ALBUMIN 2.8* 2.1* 2.7*   No results for input(s): LIPASE, AMYLASE in the last 168 hours. No results for input(s): AMMONIA in the last 168 hours. Coagulation Profile: No results for input(s): INR, PROTIME in the last 168 hours. Cardiac Enzymes: No results for input(s): CKTOTAL, CKMB, CKMBINDEX, TROPONINI in the last 168 hours. BNP (last 3 results) No results for input(s): PROBNP in the last 8760 hours. HbA1C: No results for input(s): HGBA1C in the last 72 hours. CBG:  Recent Labs  Lab 07/10/18 0748 07/10/18 1132 07/10/18 1647 07/10/18 2031 07/11/18 1113  GLUCAP 72 85 167* 120* 124*   Lipid Profile: No results for input(s): CHOL, HDL, LDLCALC, TRIG, CHOLHDL, LDLDIRECT in the last 72 hours. Thyroid Function Tests: No results for input(s): TSH, T4TOTAL, FREET4, T3FREE, THYROIDAB in the last 72 hours. Anemia Panel: Recent Labs    07/11/18 0527  VITAMINB12 1,473*  FOLATE 11.9  FERRITIN 269  TIBC 272  IRON 61  RETICCTPCT 1.7   Sepsis Labs: No results for input(s): PROCALCITON, LATICACIDVEN in the last 168 hours.  No results found for this or any previous visit (from the past 240 hour(s)).   RN Pressure Injury Documentation:     Estimated body mass index is 33.29 kg/m as calculated from the following:   Height as of this encounter: 6\' 2"  (1.88 m).   Weight  as of this encounter: 117.6 kg.  Malnutrition Type:  Nutrition Problem: Increased nutrient needs Etiology: post-op healing   Malnutrition Characteristics:  Signs/Symptoms: estimated needs   Nutrition Interventions:  Interventions: MVI, Prostat, Magic cup, Boost Plus   Radiology Studies: No results found.  Scheduled Meds: . apixaban  10 mg Oral BID   Followed by  . [START ON 07/17/2018] apixaban  5 mg Oral BID  . dexamethasone  6 mg Oral Daily  . docusate sodium  100 mg Oral BID  . feeding supplement (ENSURE ENLIVE)  237 mL Oral BID BM  . feeding supplement (PRO-STAT SUGAR FREE 64)  30 mL Oral BID WC  . fentaNYL  1 patch Transdermal Q72H  . HYDROmorphone  4 mg Oral Q8H  . insulin aspart  0-15 Units Subcutaneous TID WC  . insulin glargine  10 Units Subcutaneous QHS  . lidocaine  1 application Topical Once  . metoprolol succinate  50 mg Oral Daily  . multivitamin with minerals  1 tablet Oral Daily  . oxybutynin  5 mg Oral BID  . pantoprazole  40 mg Oral BID  . senna  2 tablet Oral QHS  . sodium bicarbonate  1,300 mg Oral BID  . sodium chloride flush  10-40 mL  Intracatheter Q12H  . sodium chloride flush  3 mL Intravenous Q12H  . tamsulosin  0.4 mg Oral Daily   Continuous Infusions: . sodium chloride      LOS: 20 days   Kerney Elbe, DO [facility] PAGER is on AMION  If 7PM-7AM, please contact night-coverage [email or web address] Password [email or web address] 07/11/2018, 12:51 PM

## 2018-07-11 NOTE — Addendum Note (Signed)
Encounter addended by: Martyn Malay, RN on: 07/11/2018 6:57 PM  Actions taken: [facility] administration accepted

## 2018-07-11 NOTE — Addendum Note (Signed)
Encounter addended by: Martyn Malay, RN on: 07/11/2018 2:32 PM  Actions taken: [facility] administration accepted, Flowsheet accepted

## 2018-07-11 NOTE — Progress Notes (Signed)
CRITICAL VALUE ALERT  Critical Value:calcium 6.1  Date & Time Notied: 0630 4/23  Provider Notified: y Orders Received/Actions taken: pending

## 2018-07-11 NOTE — Addendum Note (Signed)
Encounter addended by: Martyn Malay, RN on: 07/11/2018 5:17 PM  Actions taken: [facility] administration accepted

## 2018-07-11 NOTE — Addendum Note (Signed)
Encounter addended by: Martyn Malay, RN on: 07/11/2018 9:08 AM  Actions taken: [facility] administration accepted

## 2018-07-12 ENCOUNTER — Telehealth: Payer: Self-pay | Admitting: *Deleted

## 2018-07-12 ENCOUNTER — Ambulatory Visit
Admit: 2018-07-12 | Discharge: 2018-07-12 | Disposition: A | Discharge status: Home / Self Care | Payer: Medicare Other | Attending: Radiation Oncology | Admitting: Radiation Oncology

## 2018-07-12 DIAGNOSIS — I82493 Acute embolism and thrombosis of other specified deep vein of lower extremity, bilateral: Secondary | ICD-10-CM

## 2018-07-12 LAB — GLUCOSE, CAPILLARY
Glucose-Capillary: 80 mg/dL (ref 70–99)
Glucose-Capillary: 119 mg/dL — ABNORMAL HIGH (ref 70–99)
Glucose-Capillary: 140 mg/dL — ABNORMAL HIGH (ref 70–99)
Glucose-Capillary: 110 mg/dL — ABNORMAL HIGH (ref 70–99)

## 2018-07-12 LAB — CBC WITH DIFFERENTIAL/PLATELET
Abs Immature Granulocytes: 0.11 10*3/uL — ABNORMAL HIGH (ref 0.00–0.07)
Basophils Absolute: 0 10*3/uL (ref 0.0–0.1)
Basophils Relative: 0 %
Eosinophils Absolute: 0 10*3/uL (ref 0.0–0.5)
Eosinophils Relative: 0 %
HCT: 32.4 % — ABNORMAL LOW (ref 39.0–52.0)
Hemoglobin: 10.4 g/dL — ABNORMAL LOW (ref 13.0–17.0)
Immature Granulocytes: 1 %
Lymphocytes Relative: 9 %
Lymphs Abs: 0.8 10*3/uL (ref 0.7–4.0)
MCH: 32.6 pg (ref 26.0–34.0)
MCHC: 32.1 g/dL (ref 30.0–36.0)
MCV: 101.6 fL — ABNORMAL HIGH (ref 80.0–100.0)
Monocytes Absolute: 0.2 10*3/uL (ref 0.1–1.0)
Monocytes Relative: 3 %
Neutro Abs: 7 10*3/uL (ref 1.7–7.7)
Neutrophils Relative %: 87 %
Platelets: 124 10*3/uL — ABNORMAL LOW (ref 150–400)
RBC: 3.19 MIL/uL — ABNORMAL LOW (ref 4.22–5.81)
RDW: 16.3 % — ABNORMAL HIGH (ref 11.5–15.5)
WBC: 8.1 10*3/uL (ref 4.0–10.5)
nRBC: 0 % (ref 0.0–0.2)

## 2018-07-12 LAB — COMPREHENSIVE METABOLIC PANEL
ALT: 99 U/L — ABNORMAL HIGH (ref 0–44)
AST: 59 U/L — ABNORMAL HIGH (ref 15–41)
Albumin: 2.4 g/dL — ABNORMAL LOW (ref 3.5–5.0)
Alkaline Phosphatase: 445 U/L — ABNORMAL HIGH (ref 38–126)
Anion gap: 6 (ref 5–15)
BUN: 24 mg/dL — ABNORMAL HIGH (ref 8–23)
CO2: 22 mmol/L (ref 22–32)
Calcium: 5.9 mg/dL — CL (ref 8.9–10.3)
Chloride: 111 mmol/L (ref 98–111)
Creatinine, Ser: 0.77 mg/dL (ref 0.61–1.24)
GFR calc Af Amer: >60 mL/min (ref >60)
GFR calc non Af Amer: >60 mL/min (ref >60)
Glucose, Bld: 95 mg/dL (ref 70–99)
Potassium: 3.8 mmol/L (ref 3.5–5.1)
Sodium: 139 mmol/L (ref 135–145)
Total Bilirubin: 0.8 mg/dL (ref 0.3–1.2)
Total Protein: 4.9 g/dL — ABNORMAL LOW (ref 6.5–8.1)

## 2018-07-12 LAB — HEPARIN INDUCED PLATELET AB (HIT ANTIBODY): Heparin Induced Plt Ab: 0.105 OD (ref 0.000–0.400)

## 2018-07-12 LAB — PHOSPHORUS: Phosphorus: 2.3 mg/dL — ABNORMAL LOW (ref 2.5–4.6)

## 2018-07-12 LAB — LACTATE DEHYDROGENASE: LDH: 239 U/L — ABNORMAL HIGH (ref 98–192)

## 2018-07-12 LAB — MAGNESIUM: Magnesium: 1.5 mg/dL — ABNORMAL LOW (ref 1.7–2.4)

## 2018-07-12 MED ORDER — CALCIUM GLUCONATE-NACL 1-0.675 GM/50ML-% IV SOLN
1.0000 g | Freq: Once | INTRAVENOUS | Status: AC
  Administered 2018-07-12: 1000 mg via INTRAVENOUS
  Filled 2018-07-12: qty 50

## 2018-07-12 MED ORDER — MAGNESIUM SULFATE 2 GM/50ML IV SOLN
2.0000 g | Freq: Once | INTRAVENOUS | Status: AC
  Administered 2018-07-12: 2 g via INTRAVENOUS
  Filled 2018-07-12: qty 50

## 2018-07-12 MED ORDER — HYDROMORPHONE HCL 1 MG/ML IJ SOLN
1.0000 mg | INTRAMUSCULAR | Status: DC | PRN
  Administered 2018-07-12 – 2018-07-15 (×12): 1 mg via INTRAVENOUS
  Filled 2018-07-12 (×12): qty 1

## 2018-07-12 MED ORDER — FENTANYL 25 MCG/HR TD PT72: 1.0000 | MEDICATED_PATCH | TRANSDERMAL | Status: DC

## 2018-07-12 MED ORDER — K PHOS MONO-SOD PHOS DI & MONO 155-852-130 MG PO TABS
500.0000 mg | ORAL_TABLET | Freq: Two times a day (BID) | ORAL | Status: AC
  Administered 2018-07-12 (×2): 500 mg via ORAL
  Filled 2018-07-12 (×2): qty 2

## 2018-07-12 MED ORDER — DRONABINOL 2.5 MG PO CAPS
2.5000 mg | ORAL_CAPSULE | Freq: Two times a day (BID) | ORAL | Status: DC
  Administered 2018-07-12 – 2018-07-15 (×6): 2.5 mg via ORAL
  Filled 2018-07-12 (×6): qty 1

## 2018-07-12 MED ORDER — FENTANYL 50 MCG/HR TD PT72
1.0000 | MEDICATED_PATCH | TRANSDERMAL | Status: DC
  Administered 2018-07-12 – 2018-07-18 (×3): 1 via TRANSDERMAL
  Filled 2018-07-12 (×3): qty 1

## 2018-07-12 NOTE — Progress Notes (Signed)
OT Cancellation Note  Patient Details Name: Lucas Morales MRN: [id number] DOB: 1933-10-10   Cancelled Treatment:    Reason Eval/Treat Not Completed: Other (comment).  Returned at 10:55. Pt was not up for OT. Will check back next week.  Umi Mainor 07/12/2018, 11:33 AM  Lesle Chris, OTR/L Acute Rehabilitation Services [phone] [facility] pager [phone] office 07/12/2018

## 2018-07-12 NOTE — Addendum Note (Signed)
Encounter addended by: Martyn Malay, RN on: 07/12/2018 12:36 PM  Actions taken: [facility] administration accepted

## 2018-07-12 NOTE — Progress Notes (Signed)
PROGRESS NOTE    Lucas Morales  XTK:[id number] DOB: Jun 17, 1933 DOA: 06/21/2018 PCP: Deland Pretty, MD   Brief Narrative: Lucas Morales is a 85 y.o. malewith medical history significant of adenomatous polyps, alpha-1 antitrypsin deficiency carrier, anemia, anxiety, depression, osteoarthritis of the knees and back, easy bruising, chronic bronchitis, chronic lower back pain, CAD, history of frequent diarrhea due to partial colectomy, diverticulosis, episodes of diverticulitis, hemorrhoids, gastritis, hiatal hernia, GERD, esophageal stricture, esophageal dysmotility, history of post CABG paroxysmal A. fib, history of bradycardia, hyperlipidemia, hypertension, OSA not on CPAP due to intolerance, restless leg syndrome, sciatic nerve pain, type 2 diabetes, urinary frequency whohad lumbar decompression L3-4 on 05/10/2018 and was admitted about a week ago due to suspected L3 vertebral body osteomyelitis. However, after reviewing all imaging and getting new imaging studies, it was determined that the patient had a metastatic lesion. 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 He was seen by oncology 3 days ago. He underwent biopsy of the area with interventional radiology. We are seeinghimconsultdue to AKI, worsening LFTs and multiple medical issues.   Developed AKI: Suspect multifactorial secondary to contrast and dehydration. Patient was transferred from [facility] to [place] [facility] on 4/14 to start radiation treatment to pelvis mass.  **Interim History He started radiation treatments and is on day 6 of 10 (Unable to get it this AM but will go back this afternoon).  Continues to have significant scrotal swelling and feels uncomfortable ambulating with it.  Had severe electrolyte abnormalities that were replete. Molecular Genetic Studies done and had nothing that is targetable but is undergoing mollecular array analysis that may delineate origination of tumor.  He is wanting a second opinion and  Dr. Marin Olp is to arrange this once his radiation treatments are finished  Assessment & Plan:   Principal Problem:   Pelvic mass Active Problems:   Essential hypertension   Dyslipidemia   PAF (paroxysmal atrial fibrillation) (HCC)   OSA (obstructive sleep apnea)   Deep venous thrombosis (HCC)   Cancer associated pain   DNR (do not resuscitate)   Type II diabetes mellitus ([place])   Coronary artery disease   AKI (acute kidney injury) ([place])   Metastatic cancer ([place])   Lung nodules   Bladder spasm  Pelvic mass with Metastasis  Multiple liver masses Multiple lung nodules -Patient initially admitted following lumbar decompression L3-4 on 05/10/2018 with suspected L3 osteomyelitis.   -CT pelvis notable for a 13 x 10 x 10 cm mass arising from the right ischium extending into the posterior aspect of the right acetabulum and adjacent soft tissues. 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 -Further molecular studies per oncology which came back and per Oncology unfortunately they have nothing that they can target and feel like he will need Chemotherapy; Molecular Array Analysis is still pending  -Differential for primary lesion include lung primary vs pelvic sarcoma vs cholangiocarcinoma and Medical Oncology leaning towards Cholangiocarcinoma -MR brain negative for metastatic process -Oncology believes this is an extremely aggressive tumor with prognosis 2-3 months -continue XRT; completed #6/10 fractions; Last day planned 07/17/2018 -Continued treatment dose Lovenox for hypercoagulable state given malignancy but changed to Apixaban given thrombocytopenia and he is tolerating Apixaban now -Family wanting a second opinion and Dr. Marin Olp to  arrange once Radiation Treatments  are finished   Pain of Malignancy -Pain is Somewhat uncontrolled and pain regimen will need to be adjusted -Continue Pain Control with Fentanyl patch 25 mcg -C/w Dilaudid 4 mg q4hprn Moderate Pain and Decadron 4 mg p.o. daily -C/w Dilaudid IV 1 mg q1hprn for breakthrough pain; Palliative decreasing dose to 0.5 mg q1hprn -C/w Palliative care assisting with pain management  Acute Renal Failure, improved and resolved resolved -Creatinine elevated to a high of 1.94 during hospitalization. -Etiology likely secondary to decreased oral intake and contrast exposure from CT abdomen/pelvis as well as urinary retention. -Renal ultrasound unrevealing. -Creatinine improving 1.94-->1.33-->1.16-->1.04-->0.95 and is now 0.77 (0.96 on admission) -Avoid nephrotoxins, renally dose all medications -Continue Foley catheter until edema improves; then will attempt voiding trial -Discontinued IVF now  -Continue to monitor renal function daily  Acute Urinary Retention and Massive Scrotal Swelling -Etiology likely secondary to edema and pelvic mass with compression. -Continue tamsulosin 0.4 mg p.o. daily -Continue Foley catheter until swelling improves then will attempt voiding trial -C/w Lidocaine, Oxybutynin, Opioids, and Belladonna Suppositories  L3 lesion -Likely metastatic disease and not consistent with infection, -Infectious disease initially consulted during hospitalization, antibiotics have subsequently discontinued.  Hypertension -C/w Metoprolol succinate 50 mg p.o. daily  HLD -Continue holding statin due to abnormal LFTs.  Paroxysmal A. Fib -Continue metoprolol succinate 50 mg p.o. daily -Was on Anticoagulation with Full Dose Lovenox and will change to po Apixaban  OSA -Not on CPAP. Continue with oxygen supplementation prn  Acute DVT, right soleal vein:  -US duplex doppler with findings consistent with acute deep vein thrombosis involving the right  soleal vein. -Changed to Eliquis from Full dose Lovenox  Type 2 Diabetes  -C/w Lantus 10 units [place] qHS -C/w Moderate Novolog SSI AC  -Continue to Monitor Glucose closely while on Decadron -CBG's ranging from 80-141  Coronary Artery Disease -Continue beta-blocker with Metoprolol Succinate 50 mg po Daily   Macrocytic Anemia -Patient's Hb/Hct went from 11.4/35.7 -> 10.3/32.4 -> 9.5/30.1 -> 10.9/33.9 -> 10.4/32.4 -Continue to Monitor for S/Sx of Bleeding  -Checked Anemia Panel and showed iron level 61, U IBC of 211, TIBC of 272, saturation of 22%, ferritin level 269, folate level 11.9, vitamin B12 level of 1473 -Repeat CBC in AM   Abnormal LFT's/Transaminitis, slightly worsened  -Suspect related to liver masses -AST is now 59 (63) and ALT is now 99 (103) -Continue to Monitor and Trend Hepatic Function -Repeat CMP in AM   Hyponatremia -Improved with IV fluids and is now 139 -Discontinued IV Maintenance Fluids at 45 mL/hr -Continue to Monitor and Trend Na+ and repeat CMP in AM.  Hypokalemia, improved  -Patient's K+ was 3.8 -Continue to monitor and replete as necessary -Repeat CMP in the a.m.  Hypocalcemia -Patient's Ca2+ was 5.9 -Replete with IV Calcium Gluconate 1 gram again  -Corrected Ca2+ for Albumin was 7.2 -Continue to Monitor and Replete as Necessary  -Repeat Ca2+ in AM   Hypomagnesemia -Patient magnesium level this morning was 1.5 -Replete with IV mag sulfate 2 g  -Continue monitor and replete as necessary -Repeat magnesium level in the a.m.  Hypophosphatemia -Patient's phosphorus levels morning was 2.3 -Replete with p.o. K-Phos Neutral 500 mg twice daily x2 doses -Continue monitor replete as necessary -Repeat phosphorus level in the a.m.  Metabolic Acidosis -Continue Sodium Bicarbonate 1300mg  PO BID -CO2 was 19 and AG was 9; Now CO2 is 22 and AG is 6  Thrombocytopenia  -Was Worsening but improved from yesterday  -Platelet Count has been dropping and  has dropped over the course of the last 5-10 days -Has been on Full Dose Lovenox -Concern for HIT so will send Ab; Pharmacy Notified and will switch Anticoagulation to Eliquis -Platelet Count improved from 119,000 and was 137,000 but now back down to 124,000 -Continue to Monitor for S/Sx of Bleeding -Repeat CBC in AM  DVT prophylaxis: Anticoagulated with Apixaban   Code Status: DO NOT RESUSCITATE Family Communication: No family at bedside and attempted to update daughter via Telephone but she did not answer again Disposition Plan: Continue Radiation Treatment and PT recommends SNF versus home health; will likely remain inpatient for radiation treatments with planned completion on 07/17/2018  Consultants:   Palliative Care Medicine   Medical Oncology   Radiation Oncology  Orthopedic Surgery  Interventional Radiology   Procedures:  CT guided biopsy lytic right pelvic bone lesion on 06/27/2018 by IR  Antimicrobials:  Anti-infectives (From admission, onward)   Start     Dose/Rate Route Frequency Ordered Stop   06/27/18 1000  fluconazole (DIFLUCAN) tablet 100 mg  Status:  Discontinued     100 mg Oral Daily 06/27/18 0625 07/05/18 0944   06/24/18 0700  vancomycin (VANCOCIN) 1,250 mg in sodium chloride 0.9 % 250 mL IVPB  Status:  Discontinued     1,250 mg 166.7 mL/hr over 90 Minutes Intravenous Every 12 hours 06/23/18 1916 06/25/18 0815   06/22/18 1300  cefTRIAXone (ROCEPHIN) 2 g in sodium chloride 0.9 % 100 mL IVPB  Status:  Discontinued     2 g 200 mL/hr over 30 Minutes Intravenous Every 24 hours 06/22/18 1217 06/25/18 0758   06/22/18 0700  vancomycin (VANCOCIN) 1,000 mg in sodium chloride 0.9 % 250 mL IVPB  Status:  Discontinued     1,000 mg 250 mL/hr over 60 Minutes Intravenous Every 12 hours 06/21/18 1813 06/23/18 1916   06/22/18 0300  piperacillin-tazobactam (ZOSYN) IVPB 3.375 g  Status:  Discontinued     3.375 g 12.5 mL/hr over 240 Minutes Intravenous Every 8 hours 06/21/18  1813 06/22/18 1217   06/21/18 1700  piperacillin-tazobactam (ZOSYN) IVPB 3.375 g     3.375 g 100 mL/hr over 30 Minutes Intravenous  Once 06/21/18 1650 06/21/18 1847   06/21/18 1700  vancomycin (VANCOCIN) 2,000 mg in sodium chloride 0.9 % 500 mL IVPB     2,000 mg 250 mL/hr over 120 Minutes Intravenous  Once 06/21/18 1658 06/21/18 2200     Subjective: Seen and examined at bedside and was complaining of some pain.  Denies chest pain, lightheadedness or dizziness but states his scrotum is still swollen and slightly improved.  Went for radiation this morning but had to come back as the machine was down. No other concerns or complaints at this time.   Objective: Vitals:   07/09/18 0800 07/10/18 2029 07/11/18 0530 07/12/18 1100  BP:  140/85 115/71   Pulse:  84 92   Resp:  18 19   Temp:  97.8 F (36.6 C) 97.9 F (36.6 C)   TempSrc:  Oral Oral   SpO2:  100% 99% 96%  Weight: 117.6 kg     Height:        Intake/Output Summary (Last 24 hours) at 07/12/2018 1311 Last data filed at 07/12/2018 1244 Gross per 24 hour  Intake 25194 ml  Output 1100 ml  Net 24094 ml   Filed Weights   07/05/18 1133 07/08/18 1100 07/09/18 0800  Weight: 112 kg 119.6 kg 117.6 kg   Examination: Physical Exam:  Constitutional: Well-nourished, well-developed obese  elderly Caucasian male who appears discouraged again and complaining of pain and appears slightly uncomfortable Eyes: Lids and conjunctive are normal.  Sclera anicteric ENMT: External ears and nose appear normal.  Mucous members moist Neck: Appears supple no JVD Respiratory: Diminished auscultation bilaterally no appreciable wheezing, rales, rhonchi. Cardiovascular: Irregularly irregular but rate controlled.  Has 2-3+ lower extremity edema is wearing TED hose Abdomen: Soft, nontender, slightly distended secondary to body habitus.  Bowel sounds x4 GU: Deferred but has a massive scrotal swelling and penile swelling with an indwelling Foley catheter in  place and darker colored urine Musculoskeletal: No contractures or cyanosis.  No joint deformities in upper or lower extremities. Skin: No appreciable rashes or lesions on to skin evaluation. Neurologic: Cranial nerves II through XII grossly intact with no appreciable focal deficits Psychiatric: Slightly anxious and depressed appearing.  Is awake, alert, oriented x3  Data Reviewed: I have personally reviewed following labs and imaging studies  CBC: Recent Labs  Lab 07/08/18 1019 07/10/18 0516 07/11/18 0527 07/12/18 0356  WBC 10.1 8.1 8.8 8.1  NEUTROABS  --   --  7.5 7.0  HGB 10.3* 9.5* 10.9* 10.4*  HCT 32.4* 30.1* 33.9* 32.4*  MCV 103.5* 103.1* 102.7* 101.6*  PLT 157 119* 137* 381*   Basic Metabolic Panel: Recent Labs  Lab 07/06/18 0345 07/07/18 0349 07/10/18 0516 07/11/18 0527 07/12/18 0356  NA 138 139 140 137 139  K 4.4 4.0 3.2* 3.7 3.8  CL 110 110 115* 109 111  CO2 21* 22 20* 19* 22  GLUCOSE 124* 104* 84 102* 95  BUN 41* 38* 25* 28* 24*  CREATININE 1.04 0.95 0.82 0.85 0.77  CALCIUM 6.8* 6.7* 5.6* 6.1* 5.9*  MG 2.0  --  1.3* 1.8 1.5*  PHOS  --   --  2.1* 2.2* 2.3*   GFR: Estimated Creatinine Clearance: 97.1 mL/min (by C-G formula based on SCr of 0.77 mg/dL). Liver Function Tests: Recent Labs  Lab 07/07/18 0349 07/10/18 0516 07/11/18 0527 07/12/18 0356  AST 47* 51* 63* 59*  ALT 80* 83* 103* 99*  ALKPHOS 414* 306* 427* 445*  BILITOT 1.0 0.6 1.0 0.8  PROT 5.4* 4.4* 5.4* 4.9*  ALBUMIN 2.8* 2.1* 2.7* 2.4*   No results for input(s): LIPASE, AMYLASE in the last 168 hours. No results for input(s): AMMONIA in the last 168 hours. Coagulation Profile: No results for input(s): INR, PROTIME in the last 168 hours. Cardiac Enzymes: No results for input(s): CKTOTAL, CKMB, CKMBINDEX, TROPONINI in the last 168 hours. BNP (last 3 results) No results for input(s): PROBNP in the last 8760 hours. HbA1C: No results for input(s): HGBA1C in the last 72 hours. CBG: Recent  Labs  Lab 07/11/18 1113 07/11/18 1635 07/11/18 2022 07/12/18 0749 07/12/18 1128  GLUCAP 124* 141* 120* 80 119*   Lipid Profile: No results for input(s): CHOL, HDL, LDLCALC, TRIG, CHOLHDL, LDLDIRECT in the last 72 hours. Thyroid Function Tests: No results for input(s): TSH, T4TOTAL, FREET4, T3FREE, THYROIDAB in the last 72 hours. Anemia Panel: Recent Labs    07/11/18 0527  VITAMINB12 1,473*  FOLATE 11.9  FERRITIN 269  TIBC 272  IRON 61  RETICCTPCT 1.7   Sepsis Labs: No results for input(s): PROCALCITON, LATICACIDVEN in the last 168 hours.  No results found for this or any previous visit (from the past 240 hour(s)).   RN Pressure Injury Documentation:     Estimated body mass index is 33.29 kg/m as calculated from the following:   Height as of this encounter:  6\' 2"  (1.88 m).   Weight as of this encounter: 117.6 kg.  Malnutrition Type:  Nutrition Problem: Increased nutrient needs Etiology: post-op healing   Malnutrition Characteristics:  Signs/Symptoms: estimated needs   Nutrition Interventions:  Interventions: MVI, Prostat, Magic cup, Boost Plus   Radiology Studies: No results found.  Scheduled Meds: . apixaban  10 mg Oral BID   Followed by  . [START ON 07/17/2018] apixaban  5 mg Oral BID  . dexamethasone  4 mg Oral Daily  . docusate sodium  100 mg Oral BID  . dronabinol  2.5 mg Oral BID AC  . feeding supplement (ENSURE ENLIVE)  237 mL Oral BID BM  . feeding supplement (PRO-STAT SUGAR FREE 64)  30 mL Oral BID WC  . fentaNYL  1 patch Transdermal Q72H  . insulin aspart  0-15 Units Subcutaneous TID WC  . insulin glargine  10 Units Subcutaneous QHS  . lidocaine  1 application Topical Once  . metoprolol succinate  50 mg Oral Daily  . multivitamin with minerals  1 tablet Oral Daily  . oxybutynin  5 mg Oral BID  . pantoprazole  40 mg Oral BID  . phosphorus  500 mg Oral BID  . senna  1 tablet Oral QHS  . sodium bicarbonate  1,300 mg Oral BID  . sodium  chloride flush  10-40 mL Intracatheter Q12H  . sodium chloride flush  3 mL Intravenous Q12H  . tamsulosin  0.4 mg Oral Daily   Continuous Infusions: . sodium chloride    . calcium gluconate 1,000 mg (07/12/18 1232)    LOS: 21 days   Kerney Elbe, DO [facility] PAGER is on [place]  If 7PM-7AM, please contact night-coverage [email or web address] Password [email or web address] 07/12/2018, 1:11 PM

## 2018-07-12 NOTE — Progress Notes (Signed)
OT Cancellation Note  Patient Details Name: Lucas Morales MRN: [id number] DOB: 1933-12-05   Cancelled Treatment:     Checked with pt to see if he wanted to me to assist with donning sling around scrotom.  He feels just as comfortable without it, so doesn't want to bother with it at this time.  Sling material is in closet and pictures are in the hard chart as well as in Epic if he changes his mind.  Adonys Wildes 07/12/2018, 3:42 PM  Lesle Chris, OTR/L Acute Rehabilitation Services [phone] [facility] pager [phone] office 07/12/2018

## 2018-07-12 NOTE — Progress Notes (Signed)
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  The pain is with the catheter in his bladder.  Unfortunately, I am unsure what can be done about this.  He has a lot of scrotal swelling.  He is on a fentanyl patch at 25 mcg.  I suppose this could always be increased if necessary.  He is still getting radiation treatments.  He has had no problems with radiation.  He does not have much of an appetite.  I will try him on Marinol.  Hopefully this might help him a little bit.  I know physical therapy is working hard with him.  Occupational therapy is also working with him.  There is no nausea or vomiting.  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  His platelet count is holding steady at 124,000.  White cell count 8.1.  Hemoglobin is 10.4.  He does have a low calcium of 5.9.  His albumin is 2.4.  Overall, there is really no change in his physical exam.  His temperature is 98.3.  Pulse is 65.  Blood pressure is 132/79.  His lungs are clear bilaterally.  Oral exam shows some slight dry oral mucosa.  His cardiac exam irregular rate and rhythm consistent with the atrial fibrillation.  The rate is well controlled.  Abdomen is soft.  He has some decent bowel sounds.  There is no guarding or rebound tenderness.  Scrotal exam does show the marked scrotal edema.  Extremities shows some edema in his legs.  Neurological exam is nonfocal.  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  I am still awaiting the molecular array study to try to identify the etiology of his cancer.  That might come back next week.  I know that he has been getting great care from everybody up on [address].  Lattie Haw, MD  [place]velation 21:4

## 2018-07-12 NOTE — Progress Notes (Signed)
Daily Progress Note   Patient Name: Lucas Morales       Date: 09/06/5091 DOB: [birth year]  Age: 85 y.o. MRN#: [id number] Attending Physician: Kerney Elbe, DO Primary Care Physician: Deland Pretty, MD Admit Date: 06/21/2018  Reason for Consultation/Follow-up: Non pain symptom management, Pain control and Psychosocial/spiritual support  Subjective: We discussed his pain.  The back pain (cancer pain) seems controlled on the current regimen.  His difficulty with pain comes from pain in the tip of his penis.  Both the oral and the IV medications seem to ease that off some, but he still does not have real relief from it.      Assessment: Over the last few days he has been relatively consistent with usage of pain medication.  In last 24 hours, has had total of 156 morphine eq for rescue meds.  Discussed possible increase in entnayl patch with Dr. Marin Olp this AM.       Patient Profile/HPI:  85 y.o. male  with past medical history of CAD, Afib, alpha 1 antitrypsin deficiency, interstitial cystitis (with a tendency to have urinary retention), DM, depression and anxiety who was admitted on 06/21/2018 with what was thought to be a post op infection at L3-L4.  Work up has revealed widespread (lungs, liver, pelvis, bone) metastatic cancer of undetermined primary.  Lucas Morales is going for a biopsy this morning.   PMT has been asked to consult for symptom management.    Length of Stay: 21  Current Medications: Scheduled Meds:  . apixaban  10 mg Oral BID   Followed by  . [START ON 07/17/2018] apixaban  5 mg Oral BID  . dexamethasone  4 mg Oral Daily  . docusate sodium  100 mg Oral BID  . dronabinol  2.5 mg Oral BID AC  . feeding supplement (ENSURE ENLIVE)  237 mL Oral BID BM  . feeding  supplement (PRO-STAT SUGAR FREE 64)  30 mL Oral BID WC  . fentaNYL  1 patch Transdermal Q72H  . insulin aspart  0-15 Units Subcutaneous TID WC  . insulin glargine  10 Units Subcutaneous QHS  . lidocaine  1 application Topical Once  . metoprolol succinate  50 mg Oral Daily  . multivitamin with minerals  1 tablet Oral Daily  . oxybutynin  5 mg Oral BID  .  pantoprazole  40 mg Oral BID  . phosphorus  500 mg Oral BID  . senna  1 tablet Oral QHS  . sodium bicarbonate  1,300 mg Oral BID  . sodium chloride flush  10-40 mL Intracatheter Q12H  . sodium chloride flush  3 mL Intravenous Q12H  . tamsulosin  0.4 mg Oral Daily    Continuous Infusions: . sodium chloride      PRN Meds: sodium chloride, acetaminophen, alum & mag hydroxide-simeth, bisacodyl, HYDROmorphone (DILAUDID) injection, HYDROmorphone, LORazepam, menthol-cetylpyridinium, neomycin-bacitracin-polymyxin, ondansetron **OR** ondansetron (ZOFRAN) IV, opium-belladonna, sodium chloride flush, sodium chloride flush  Physical Exam        Well developed, awake, alert, appropriate, NAD CV slightly irreg rhythm but regular rate, no m/r/g Resp no distress.  No w/c/r Abdomen soft, nd, nt, +bs.    Vital Signs: BP 115/71 (BP Location: Right Arm)   Pulse 92   Temp 97.9 F (36.6 C) (Oral)   Resp 19   Ht 6\' 2"  (1.88 m)   Wt 117.6 kg   SpO2 96%   BMI 33.29 kg/m  SpO2: SpO2: 96 % O2 Device: O2 Device: Room Air O2 Flow Rate: O2 Flow Rate (L/min): 2 L/min  Intake/output summary:   Intake/Output Summary (Last 24 hours) at 07/12/2018 2022 Last data filed at 07/12/2018 1729 Gross per 24 hour  Intake 1580 ml  Output 1250 ml  Net 330 ml   LBM: Last BM Date: 07/09/18 Baseline Weight: Weight: 101.3 kg Most recent weight: Weight: 117.6 kg       Palliative Assessment/Data:  50%    Flowsheet Rows     Most Recent Value  Intake Tab  Referral Department  Oncology  Unit at Time of Referral  Med/Surg Unit  Palliative Care Primary  Diagnosis  Cancer  Date Notified  06/26/18  Palliative Care Type  New Palliative care  Reason for referral  Non-pain Symptom  Date of Admission  06/21/18  Date first seen by Palliative Care  06/27/18  # of days Palliative referral response time  1 Day(s)  # of days IP prior to Palliative referral  5  Clinical Assessment  Psychosocial & Spiritual Assessment  Palliative Care Outcomes      Patient Active Problem List   Diagnosis Date Noted  . Lung nodules 07/03/2018  . Bladder spasm   . Metastatic cancer ([place])   . Pelvic mass   . Type II diabetes mellitus ([place]) 06/27/2018  . Coronary artery disease 06/27/2018  . AKI (acute kidney injury) ([place]) 06/27/2018  . Cancer associated pain   . DNR (do not resuscitate)   . Palliative care encounter   . Deep venous thrombosis ([place]) 06/26/2018  . 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 Status post lumbar spine surgery for decompression of spinal cord 05/24/2018  . Abnormal EKG 05/25/2017  . S/P cervical spinal fusion 01/25/2016  . Cervical spinal stenosis 12/20/2015  . Cervical spondylosis 12/20/2015  . Painful orthopaedic hardware left hip with chronic trochanteric bursitis 07/30/2015  . Hip bursitis 07/30/2015  . Trochanteric bursitis of left hip 07/30/2015  . Chest pain with moderate risk of acute coronary syndrome 10/19/2014  . Macular hole, left eye 09/01/2014  . Macular hole of left eye 08/28/2014  . Other pancytopenia ([place])   . Sliding hiatal hernia   . Gastric erosions   . GI bleeding 06/04/2014  . Acute GI bleeding 06/04/2014  . Orthostatic hypotension 06/04/2014  . Acute upper gastrointestinal bleeding 06/04/2014  . Other specified diabetes mellitus without  complications ([place])   . Acute blood loss anemia   . Thrombocytopenia ([place])   . Closed left hip fracture ([place]) 04/03/2014  . Fracture, femur ([place]) 04/03/2014  . Femur fracture ([place]) 04/03/2014  . Upper airway cough syndrome 05/16/2013  . OSA (obstructive sleep  apnea) 10/21/2012  . Macular hole 10/08/2012  . Preoperative clearance 09/30/2012  . PAF (paroxysmal atrial fibrillation) ([place]) 01/21/2012  . Shortness of breath on exertion 01/11/2012  . Bradycardia, sinus 01/11/2012  . Hx of CABG 01/11/2012  . Essential hypertension   . Dyslipidemia   . ABDOMINAL PAIN-LLQ 02/04/2010  . HEMORRHOIDS 08/31/2008  . ESOPHAGEAL STRICTURE 08/31/2008  . GERD 08/31/2008  . Gastritis and gastroduodenitis 08/31/2008  . HIATAL HERNIA 08/31/2008  . DIVERTICULOSIS, COLON 08/31/2008  . Chronic interstitial cystitis 08/31/2008  . COLONIC POLYPS, ADENOMATOUS, HX OF 08/31/2008    Palliative Care Plan    Recommendations/Plan:  Increase fenantyl patch to 63mcg/hr.  Dr. Marin Olp changed his short acting opioids this AM.  Continue same.  Please place a thigh strap to stabilize foley cath when he is out of bed.  PMT will continue to check in intermittently for symptom management.  Goals of Care and Additional Recommendations:  Limitations on Scope of Treatment: Full Scope Treatment  Code Status:  DNR  Prognosis:   < 6 months due to high grade widely metastatic carcinoma   Discharge Planning:  To his home he will have 24 hour family care.  He will need home health services if he opts for further oncologic therapy OR hospice services in the home if he opts for supportive care (no chemo or IV treatments).  Care plan was discussed with patient, PMT attending, bedside RN  Thank you for allowing the Palliative Medicine Team to assist in the care of this patient.  Total time spent:  35 min.     Greater than 50%  of this time was spent counseling and coordinating care related to the above assessment and plan.  Micheline Rough, MD [place] Team [phone]   Please contact Palliative MedicineTeam phone at [phone] for questions and concerns between 7 am - 7 pm.   Please see AMION for individual provider pager numbers.

## 2018-07-12 NOTE — Addendum Note (Signed)
Encounter addended by: Martyn Malay, RN on: 07/12/2018 4:13 PM  Actions taken: [facility] administration accepted

## 2018-07-12 NOTE — Telephone Encounter (Signed)
Left message for patient to call and schedule 6 mos recall virtual/telephone visit with Dr. Gwenlyn Found

## 2018-07-12 NOTE — Plan of Care (Signed)
  Problem: Clinical Measurements: Goal: Diagnostic test results will improve Outcome: Progressing   Problem: Clinical Measurements: Goal: Respiratory complications will improve Outcome: Progressing   Problem: Clinical Measurements: Goal: Cardiovascular complication will be avoided Outcome: Progressing   Problem: Activity: Goal: Risk for activity intolerance will decrease Outcome: Progressing   Problem: Clinical Measurements: Goal: Ability to maintain clinical measurements within normal limits will improve Outcome: Progressing   Problem: Pain Managment: Goal: General experience of comfort will improve Outcome: Progressing

## 2018-07-12 NOTE — Progress Notes (Signed)
PT Cancellation Note  Patient Details Name: NORFLEET CAPERS MRN: [id number] DOB: August 24, 1933   Cancelled Treatment:     pt unable to tolerate per OT .  Will continue to monitor for mobility and D/C planning.     Rica Koyanagi  PTA Acute  Rehabilitation Services Pager      [phone] Office      [phone]

## 2018-07-12 NOTE — Progress Notes (Signed)
OT Cancellation Note  Patient Details Name: Lucas Morales MRN: [id number] DOB: Aug 13, 1933   Cancelled Treatment:    Reason Eval/Treat Not Completed: Other (comment). Pt just finiished eating and would like me to return around 10:45 if possible. Will try.  [place] 07/12/2018, 9:00 AM  Lesle Chris, OTR/L Acute Rehabilitation Services [phone] [facility] pager [phone] office 07/12/2018

## 2018-07-12 NOTE — Addendum Note (Signed)
Encounter addended by: Martyn Malay, RN on: 07/12/2018 9:19 AM  Actions taken: [facility] administration accepted

## 2018-07-12 NOTE — Progress Notes (Signed)
CRITICAL VALUE ALERT  Critical Value:  Ca 5.9  Date & Time Notied:  07/12/18 at 5:31 am  Provider Notified: 5:40 am  Orders Received/Actions taken: Awaiting further orders.

## 2018-07-13 DIAGNOSIS — R338 Other retention of urine: Secondary | ICD-10-CM

## 2018-07-13 LAB — LACTATE DEHYDROGENASE: LDH: 237 U/L — ABNORMAL HIGH (ref 98–192)

## 2018-07-13 LAB — GLUCOSE, CAPILLARY
Glucose-Capillary: 84 mg/dL (ref 70–99)
Glucose-Capillary: 115 mg/dL — ABNORMAL HIGH (ref 70–99)
Glucose-Capillary: 209 mg/dL — ABNORMAL HIGH (ref 70–99)
Glucose-Capillary: 130 mg/dL — ABNORMAL HIGH (ref 70–99)

## 2018-07-13 LAB — COMPREHENSIVE METABOLIC PANEL
ALT: 100 U/L — ABNORMAL HIGH (ref 0–44)
AST: 56 U/L — ABNORMAL HIGH (ref 15–41)
Albumin: 2.7 g/dL — ABNORMAL LOW (ref 3.5–5.0)
Alkaline Phosphatase: 463 U/L — ABNORMAL HIGH (ref 38–126)
Anion gap: 8 (ref 5–15)
BUN: 23 mg/dL (ref 8–23)
CO2: 21 mmol/L — ABNORMAL LOW (ref 22–32)
Calcium: 6.1 mg/dL — CL (ref 8.9–10.3)
Chloride: 109 mmol/L (ref 98–111)
Creatinine, Ser: 0.78 mg/dL (ref 0.61–1.24)
GFR calc Af Amer: >60 mL/min (ref >60)
GFR calc non Af Amer: >60 mL/min (ref >60)
Glucose, Bld: 104 mg/dL — ABNORMAL HIGH (ref 70–99)
Potassium: 3.8 mmol/L (ref 3.5–5.1)
Sodium: 138 mmol/L (ref 135–145)
Total Bilirubin: 0.9 mg/dL (ref 0.3–1.2)
Total Protein: 5.2 g/dL — ABNORMAL LOW (ref 6.5–8.1)

## 2018-07-13 LAB — CBC WITH DIFFERENTIAL/PLATELET
Abs Immature Granulocytes: 0.05 10*3/uL (ref 0.00–0.07)
Basophils Absolute: 0 10*3/uL (ref 0.0–0.1)
Basophils Relative: 0 %
Eosinophils Absolute: 0 10*3/uL (ref 0.0–0.5)
Eosinophils Relative: 0 %
HCT: 33.4 % — ABNORMAL LOW (ref 39.0–52.0)
Hemoglobin: 10.6 g/dL — ABNORMAL LOW (ref 13.0–17.0)
Immature Granulocytes: 1 %
Lymphocytes Relative: 12 %
Lymphs Abs: 0.9 10*3/uL (ref 0.7–4.0)
MCH: 32.3 pg (ref 26.0–34.0)
MCHC: 31.7 g/dL (ref 30.0–36.0)
MCV: 101.8 fL — ABNORMAL HIGH (ref 80.0–100.0)
Monocytes Absolute: 0.2 10*3/uL (ref 0.1–1.0)
Monocytes Relative: 3 %
Neutro Abs: 6.1 10*3/uL (ref 1.7–7.7)
Neutrophils Relative %: 84 %
Platelets: 129 10*3/uL — ABNORMAL LOW (ref 150–400)
RBC: 3.28 MIL/uL — ABNORMAL LOW (ref 4.22–5.81)
RDW: 16.2 % — ABNORMAL HIGH (ref 11.5–15.5)
WBC: 7.3 10*3/uL (ref 4.0–10.5)
nRBC: 0 % (ref 0.0–0.2)

## 2018-07-13 LAB — CALCIUM, IONIZED: Calcium, Ionized, Serum: 3.7 mg/dL — ABNORMAL LOW (ref 4.5–5.6)

## 2018-07-13 LAB — PHOSPHORUS: Phosphorus: 2.8 mg/dL (ref 2.5–4.6)

## 2018-07-13 LAB — MAGNESIUM: Magnesium: 1.8 mg/dL (ref 1.7–2.4)

## 2018-07-13 MED ORDER — CALCIUM GLUCONATE-NACL 1-0.675 GM/50ML-% IV SOLN
1.0000 g | Freq: Once | INTRAVENOUS | Status: AC
  Administered 2018-07-13: 1000 mg via INTRAVENOUS
  Filled 2018-07-13: qty 50

## 2018-07-13 MED ORDER — HYDROMORPHONE HCL 1 MG/ML IJ SOLN
1.0000 mg | Freq: Once | INTRAMUSCULAR | Status: AC
  Administered 2018-07-13: 1 mg via INTRAVENOUS
  Filled 2018-07-13: qty 1

## 2018-07-13 MED ORDER — BELLADONNA ALKALOIDS-OPIUM 16.2-60 MG RE SUPP
1.0000 | Freq: Once | RECTAL | Status: AC
  Administered 2018-07-13: 1 via RECTAL
  Filled 2018-07-13: qty 1

## 2018-07-13 MED ORDER — FUROSEMIDE 10 MG/ML IJ SOLN
40.0000 mg | Freq: Once | INTRAMUSCULAR | Status: AC
  Administered 2018-07-13: 11:00:00 40 mg via INTRAVENOUS
  Filled 2018-07-13: qty 4

## 2018-07-13 MED ORDER — CALCIUM GLUCONATE-NACL 1-0.675 GM/50ML-% IV SOLN
1.0000 g | Freq: Once | INTRAVENOUS | Status: AC
  Administered 2018-07-13: 09:00:00 1000 mg via INTRAVENOUS
  Filled 2018-07-13: qty 50

## 2018-07-13 MED ORDER — BELLADONNA ALKALOIDS-OPIUM 16.2-60 MG RE SUPP
1.0000 | Freq: Three times a day (TID) | RECTAL | Status: DC
  Administered 2018-07-13 – 2018-07-18 (×2): 1 via RECTAL
  Filled 2018-07-13 (×6): qty 1

## 2018-07-13 NOTE — Progress Notes (Signed)
PROGRESS NOTE    Lucas Morales  DGU:[id number] DOB: 05-Jun-1933 DOA: 06/21/2018 PCP: Deland Pretty, MD   Brief Narrative: Lucas Morales is a 85 y.o. malewith medical history significant of adenomatous polyps, alpha-1 antitrypsin deficiency carrier, anemia, anxiety, depression, osteoarthritis of the knees and back, easy bruising, chronic bronchitis, chronic lower back pain, CAD, history of frequent diarrhea due to partial colectomy, diverticulosis, episodes of diverticulitis, hemorrhoids, gastritis, hiatal hernia, GERD, esophageal stricture, esophageal dysmotility, history of post CABG paroxysmal A. fib, history of bradycardia, hyperlipidemia, hypertension, OSA not on CPAP due to intolerance, restless leg syndrome, sciatic nerve pain, type 2 diabetes, urinary frequency whohad lumbar decompression L3-4 on 05/10/2018 and was admitted about a week ago due to suspected L3 vertebral body osteomyelitis. However, after reviewing all imaging and getting new imaging studies, it was determined that the patient had a metastatic lesion. 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 He was seen by oncology 3 days ago. He underwent biopsy of the area with interventional radiology. We are seeinghimconsultdue to AKI, worsening LFTs and multiple medical issues.   Developed AKI: Suspect multifactorial secondary to contrast and dehydration. Patient was transferred from [facility] to [place] [facility] on 4/14 to start radiation treatment to pelvis mass.  **Interim History He started radiation treatments and is on day 6 of 10 and will resume on monday.  Continues to have significant scrotal swelling and feels uncomfortable ambulating with it.  Had severe electrolyte abnormalities that were replete. Molecular Genetic Studies done and had nothing that is targetable but is undergoing mollecular array analysis that may delineate origination of tumor.  He is wanting a second opinion and Dr. Marin Olp is to arrange this  once his radiation treatments are finished.  He is to have some pain and palliative care is adjusting his medications.  Urology was consulted given his penile swelling and scrotal swelling and pain and recommending scheduled BNO suppositories and discontinuing Anticholinergic Therapy.   Assessment & Plan:   Principal Problem:   Pelvic mass Active Problems:   Essential hypertension   Dyslipidemia   PAF (paroxysmal atrial fibrillation) (HCC)   OSA (obstructive sleep apnea)   Deep venous thrombosis (HCC)   Cancer associated pain   DNR (do not resuscitate)   Type II diabetes mellitus ([place])   Coronary artery disease   AKI (acute kidney injury) ([place])   Metastatic cancer (HCC)   Lung nodules   Bladder spasm  Pelvic mass with Metastasis  Multiple liver masses Multiple lung nodules -Patient initially admitted following lumbar decompression L3-4 on 05/10/2018 with suspected L3 osteomyelitis.   -CT pelvis notable for a 13 x 10 x 10 cm mass arising from the right ischium extending into the posterior aspect of the right acetabulum and adjacent soft tissues. 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 -Further molecular studies per oncology which came back and per Oncology unfortunately they have nothing that they can target and feel like he will need Chemotherapy; Molecular Array Analysis is still pending  -Differential for primary lesion include lung primary vs pelvic sarcoma vs cholangiocarcinoma and Medical Oncology leaning towards Cholangiocarcinoma -MR brain negative for metastatic process -Oncology believes this is an extremely aggressive tumor with prognosis 2-3 months -continue XRT; completed #6/10 fractions; Last day planned 07/17/2018  -Continued treatment dose Lovenox  for hypercoagulable state given malignancy but changed to Apixaban given thrombocytopenia and he is tolerating Apixaban now -Family wanting a second opinion and Dr. Marin Olp to arrange once Radiation Treatments are finished   Pain of Malignancy -Pain is Somewhat uncontrolled and pain regimen will need to be adjusted -Continue Pain Control with Fentanyl Patch but this was increased from 25 mcg to 50 mcg -C/w Dilaudid 4 mg q4hprn Moderate Pain and Decadron 4 mg p.o. daily -C/w Acetaminophen 650 mg po q6hprn Moderate Pain -C/w Dilaudid IV 1 mg q1hprn for breakthrough pain; Palliative decreasing dose to 0.5 mg q1hprn -C/w Palliative care assisting with pain management and Dr. Marin Olp adjusting medications   Acute Renal Failure, improved and resolved resolved -Creatinine elevated to a high of 1.94 during hospitalization. -Etiology likely secondary to decreased oral intake and contrast exposure from CT abdomen/pelvis as well as urinary retention. -Renal ultrasound unrevealing. -Creatinine improving 1.94-->1.33-->1.16-->1.04-->0.95 and is now 0.77 (0.96 on admission) -Avoid nephrotoxins, renally dose all medications -Continue Foley catheter until edema improves; then will attempt voiding trial -Discontinued IVF now and gave a dose of IV Lasix 40 mg x1 -Continue to monitor renal function daily  Acute Urinary Retention and Massive Penile and Scrotal Swelling -Etiology likely secondary to edema and pelvic mass with compression. -Continue Tamsulosin 0.4 mg p.o. daily -Continue Foley catheter until swelling improves then will attempt voiding trial -C/w Lidocaine, Oxybutynin now discontinued, Opioids, and Belladonna Suppositories -Urology Consulted appreciate further evaluation and recommendations; Dr. Alinda Money evaluated patient and agrees with alpha blocker therapy and is hesitant to having him proceed with a voiding trial right now because catheterization will be  difficult if he cannot void there is edema and he believes a CIC is also not a great option right now but he hopes to proceed with a voiding trial with improvement of his penoscrotal edema and will consider early this week -Dr. Alinda Money also recommending scrotal elevation with a rolled up towel in his scrotum to help reduce edema -Dr. Alinda Money recommending stopping anticholinergic therapy with oxybutynin and recommends administering BNO suppositories around-the-clock so have changed it to q8h.  Also states is okay to use Neosporin/Vaseline or lidocaine jelly around the tip of the penis  L3 lesion -Likely metastatic disease and not consistent with infection, -Infectious disease initially consulted during hospitalization, antibiotics have subsequently discontinued.  Hypertension -C/w Metoprolol succinate 50 mg p.o. daily  HLD -Continue holding statin due to abnormal LFTs.  Paroxysmal A. Fib -Continue Metoprolol Succinate 50 mg p.o. daily -Was on Anticoagulation with Full Dose Lovenox and will change to po Apixaban  OSA -Not on CPAP. Continue with oxygen supplementation prn  Acute DVT, Right Soleal Vein  -US Duplex doppler with findings consistent with acute deep vein thrombosis involving the right soleal vein. -Changed to Eliquis from Full dose Lovenox  Type 2 Diabetes  -C/w Lantus 10 units [place] qHS -C/w Moderate Novolog SSI AC  -Continue to Monitor Glucose closely while on Decadron -CBG's ranging from 84-140  Coronary Artery Disease -Continue beta-blocker with Metoprolol Succinate 50 mg po Daily   Macrocytic Anemia -Patient's Hb/Hct went from 11.4/35.7 -> 10.3/32.4 -> 9.5/30.1 -> 10.9/33.9 -> 10.4/32.4 -> 10.6/33.4 -Continue to Monitor for S/Sx of Bleeding  -Checked Anemia Panel and showed iron level 61, U IBC of 211, TIBC of 272, saturation of 22%, ferritin level 269, folate level 11.9, vitamin B12 level of 1473 -Repeat CBC in AM   Abnormal LFT's/Transaminitis, slightly  worsened  -Suspect related to liver masses -AST is now 56 (59)  and ALT is now 100 (99) -Continue to Monitor and Trend Hepatic Function -Repeat CMP in AM   Hyponatremia -Improved with IV fluids and is now 138 -Discontinued IV Maintenance Fluids at 45 mL/hr -Continue to Monitor and Trend Na+ and repeat CMP in AM.  Hypokalemia, improved  -Patient's K+ was 3.8 -Continue to monitor and replete as necessary -Repeat CMP in the a.m.  Hypocalcemia -Patient's Ca2+ was 6.1 -Replete with IV Calcium Gluconate 1 gram x2 today -Corrected Ca2+ for Albumin was 7.2 -Continue to Monitor and Replete as Necessary  -Repeat Ca2+ in AM   Hypomagnesemia -Patient magnesium level this morning was 1.8 -Continue monitor and replete as necessary -Repeat magnesium level in the a.m.  Hypophosphatemia -Patient's phosphorus levels morning was 2.8 -Continue monitor replete as necessary -Repeat phosphorus level in the a.m.  Metabolic Acidosis, improved  -Continue Sodium Bicarbonate 1300mg  PO BID -CO2 now 21 and AG is 8  Thrombocytopenia, slightly improving  -Platelet Count had been dropping -Had been on Full Dose Lovenox -Concern for HIT so will send Ab; Pharmacy Notified and switched Anticoagulation to Eliquis -Platelet Count now 129,000 after changing to Eliquis and trending upwards  -Continue to Monitor for S/Sx of Bleeding -Repeat CBC in AM  Lower Extremity Swelling -In the setting of his cancer and poor albumin status -We will consult nutrition for further evaluation recommendations -Continue with feeding supplements of Ensure Enlive 237 mils p.o. twice daily as well as Protostat 30 mL's p.o. twice daily -Given one-time dose of IV Lasix 40 mg -Continue to monitor  Obesity -Estimated body mass index is 33.29 kg/m as calculated from the following:   Height as of this encounter: 6\' 2"  (1.88 m).   Weight as of this encounter: 117.6 kg. -Nutritionist on board as patient is having poor p.o.  intake and was started on Dronabinol 2.5 po BID -Nutritionist following and recommending continue Ensure Enlive p.o. twice daily, pro-stat 30 mL's p.o. twice daily, multivitamin with mineral daily, as well as Magic cup 3 times daily  DVT prophylaxis: Anticoagulated with Apixaban   Code Status: DO NOT RESUSCITATE Family Communication: No family at bedside and updated daughter over the phone Disposition Plan: Continue Radiation Treatment and PT recommends SNF versus home health; will likely remain inpatient for radiation treatments with planned completion on 07/17/2018  Consultants:   Palliative Care Medicine   Medical Oncology   Radiation Oncology  Orthopedic Surgery  Interventional Radiology  Urology   Procedures:  CT guided biopsy lytic right pelvic bone lesion on 06/27/2018 by IR  Antimicrobials:  Anti-infectives (From admission, onward)   Start     Dose/Rate Route Frequency Ordered Stop   06/27/18 1000  fluconazole (DIFLUCAN) tablet 100 mg  Status:  Discontinued     100 mg Oral Daily 06/27/18 0625 07/05/18 0944   06/24/18 0700  vancomycin (VANCOCIN) 1,250 mg in sodium chloride 0.9 % 250 mL IVPB  Status:  Discontinued     1,250 mg 166.7 mL/hr over 90 Minutes Intravenous Every 12 hours 06/23/18 1916 06/25/18 0815   06/22/18 1300  cefTRIAXone (ROCEPHIN) 2 g in sodium chloride 0.9 % 100 mL IVPB  Status:  Discontinued     2 g 200 mL/hr over 30 Minutes Intravenous Every 24 hours 06/22/18 1217 06/25/18 0758   06/22/18 0700  vancomycin (VANCOCIN) 1,000 mg in sodium chloride 0.9 % 250 mL IVPB  Status:  Discontinued     1,000 mg 250 mL/hr over 60 Minutes Intravenous Every 12 hours 06/21/18 1813 06/23/18 1916  06/22/18 0300  piperacillin-tazobactam (ZOSYN) IVPB 3.375 g  Status:  Discontinued     3.375 g 12.5 mL/hr over 240 Minutes Intravenous Every 8 hours 06/21/18 1813 06/22/18 1217   06/21/18 1700  piperacillin-tazobactam (ZOSYN) IVPB 3.375 g     3.375 g 100 mL/hr over 30 Minutes  Intravenous  Once 06/21/18 1650 06/21/18 1847   06/21/18 1700  vancomycin (VANCOCIN) 2,000 mg in sodium chloride 0.9 % 500 mL IVPB     2,000 mg 250 mL/hr over 120 Minutes Intravenous  Once 06/21/18 1658 06/21/18 2200     Subjective: Seen and examined at bedside and states that his pain medication is being adjusted but is pain in his penile area still pretty severe.  States is a 9 out of 10 and states that it limits him from ambulating more.  No nausea or vomiting.  Denies chest pain, lightheadedness or dizziness.  No other concerns or complaints at this time.  Objective: Vitals:   07/09/18 0800 07/10/18 2029 07/11/18 0530 07/12/18 1100  BP:  140/85 115/71   Pulse:  84 92   Resp:  18 19   Temp:  97.8 F (36.6 C) 97.9 F (36.6 C)   TempSrc:  Oral Oral   SpO2:  100% 99% 96%  Weight: 117.6 kg     Height:        Intake/Output Summary (Last 24 hours) at 07/13/2018 1236 Last data filed at 07/13/2018 0900 Gross per 24 hour  Intake 1360 ml  Output 1700 ml  Net -340 ml   Filed Weights   07/05/18 1133 07/08/18 1100 07/09/18 0800  Weight: 112 kg 119.6 kg 117.6 kg   Examination: Physical Exam:  Constitutional: Well-nourished, well-developed obese elderly Caucasian male who appears frustrated but still complaining of some penile pain and does appear somewhat uncomfortable Eyes: Lids and conjunctive are normal.  Sclera anicteric ENMT: External ears and nose appear normal.  Mucous members are moist Neck: Appears supple no JVD Respiratory: Slight diminished auscultation bilaterally no appreciable wheezing, rales, rhonchi.  Patient is not tachypneic or using any accessory muscles to breathe Cardiovascular: Irregularly irregular but his heart rate is controlled.  Has 2-3+ lower extremity edema and is wearing TED hose Abdomen: Soft, nontender, slightly distended secondary body habitus.  Bowel sounds present GU: Has a very large scrotal and penile swelling and indwelling Foley catheter with  darker colored urine Musculoskeletal: No contractures or cyanosis.  No joint deformities upper extremities. Skin: Skin is warm and dry no appreciable rashes or lesions on physical evaluation Neurologic: Cranial nerves II through XII grossly intact no appreciable focal deficits. Psychiatric: Remains somewhat anxious and depressed appearing.  He is awake, alert and oriented x3.  Data Reviewed: I have personally reviewed following labs and imaging studies  CBC: Recent Labs  Lab 07/08/18 1019 07/10/18 0516 07/11/18 0527 07/12/18 0356 07/13/18 0355  WBC 10.1 8.1 8.8 8.1 7.3  NEUTROABS  --   --  7.5 7.0 6.1  HGB 10.3* 9.5* 10.9* 10.4* 10.6*  HCT 32.4* 30.1* 33.9* 32.4* 33.4*  MCV 103.5* 103.1* 102.7* 101.6* 101.8*  PLT 157 119* 137* 124* 269*   Basic Metabolic Panel: Recent Labs  Lab 07/07/18 0349 07/10/18 0516 07/11/18 0527 07/12/18 0356 07/13/18 0355  NA 139 140 137 139 138  K 4.0 3.2* 3.7 3.8 3.8  CL 110 115* 109 111 109  CO2 22 20* 19* 22 21*  GLUCOSE 104* 84 102* 95 104*  BUN 38* 25* 28* 24* 23  CREATININE 0.95 0.82  0.85 0.77 0.78  CALCIUM 6.7* 5.6* 6.1* 5.9* 6.1*  MG  --  1.3* 1.8 1.5* 1.8  PHOS  --  2.1* 2.2* 2.3* 2.8   GFR: Estimated Creatinine Clearance: 97.1 mL/min (by C-G formula based on SCr of 0.78 mg/dL). Liver Function Tests: Recent Labs  Lab 07/07/18 0349 07/10/18 0516 07/11/18 0527 07/12/18 0356 07/13/18 0355  AST 47* 51* 63* 59* 56*  ALT 80* 83* 103* 99* 100*  ALKPHOS 414* 306* 427* 445* 463*  BILITOT 1.0 0.6 1.0 0.8 0.9  PROT 5.4* 4.4* 5.4* 4.9* 5.2*  ALBUMIN 2.8* 2.1* 2.7* 2.4* 2.7*   No results for input(s): LIPASE, AMYLASE in the last 168 hours. No results for input(s): AMMONIA in the last 168 hours. Coagulation Profile: No results for input(s): INR, PROTIME in the last 168 hours. Cardiac Enzymes: No results for input(s): CKTOTAL, CKMB, CKMBINDEX, TROPONINI in the last 168 hours. BNP (last 3 results) No results for input(s): PROBNP in  the last 8760 hours. HbA1C: No results for input(s): HGBA1C in the last 72 hours. CBG: Recent Labs  Lab 07/12/18 1128 07/12/18 1706 07/12/18 2024 07/13/18 0803 07/13/18 1113  GLUCAP 119* 140* 110* 84 115*   Lipid Profile: No results for input(s): CHOL, HDL, LDLCALC, TRIG, CHOLHDL, LDLDIRECT in the last 72 hours. Thyroid Function Tests: No results for input(s): TSH, T4TOTAL, FREET4, T3FREE, THYROIDAB in the last 72 hours. Anemia Panel: Recent Labs    07/11/18 0527  VITAMINB12 1,473*  FOLATE 11.9  FERRITIN 269  TIBC 272  IRON 61  RETICCTPCT 1.7   Sepsis Labs: No results for input(s): PROCALCITON, LATICACIDVEN in the last 168 hours.  No results found for this or any previous visit (from the past 240 hour(s)).   RN Pressure Injury Documentation:    Estimated body mass index is 33.29 kg/m as calculated from the following:   Height as of this encounter: 6\' 2"  (1.88 m).   Weight as of this encounter: 117.6 kg.  Malnutrition Type:  Nutrition Problem: Increased nutrient needs Etiology: post-op healing   Malnutrition Characteristics:  Signs/Symptoms: estimated needs   Nutrition Interventions:  Interventions: MVI, Prostat, Magic cup, Boost Plus   Radiology Studies: No results found.  Scheduled Meds: . apixaban  10 mg Oral BID   Followed by  . [START ON 07/17/2018] apixaban  5 mg Oral BID  . dexamethasone  4 mg Oral Daily  . docusate sodium  100 mg Oral BID  . dronabinol  2.5 mg Oral BID AC  . feeding supplement (ENSURE ENLIVE)  237 mL Oral BID BM  . feeding supplement (PRO-STAT SUGAR FREE 64)  30 mL Oral BID WC  . fentaNYL  1 patch Transdermal Q72H  . insulin aspart  0-15 Units Subcutaneous TID WC  . insulin glargine  10 Units Subcutaneous QHS  . lidocaine  1 application Topical Once  . metoprolol succinate  50 mg Oral Daily  . multivitamin with minerals  1 tablet Oral Daily  . pantoprazole  40 mg Oral BID  . senna  1 tablet Oral QHS  . sodium  bicarbonate  1,300 mg Oral BID  . sodium chloride flush  10-40 mL Intracatheter Q12H  . sodium chloride flush  3 mL Intravenous Q12H  . tamsulosin  0.4 mg Oral Daily   Continuous Infusions: . sodium chloride      LOS: 22 days   Kerney Elbe, DO [facility] PAGER is on AMION  If 7PM-7AM, please contact night-coverage [email or web address] Password [email or web address] 07/13/2018, 12:36 PM

## 2018-07-13 NOTE — Consult Note (Signed)
Urology Consult   Physician requesting consult: Dr. Alfredia Ferguson  Reason for consult: Penile pain, urinary retention, scrotal swelling  History of Present Illness: Lucas Morales is a 85 y.o. with a past history of BPH/LUTS, OAB/IC who has previously failed multiple medical therapies and PTNS for symptomatic relief of his LUTS.  He has recently been admitted to the hospital and diagnosed with a right pelvic tumor of unknown origin.  He has been receiving radiation therapy and final biopsy molecular profile is pending.  Dr. Sondra Come and Dr. Marin Olp are involved with his care.  He did note some exacerbating LUTS for a few days prior to his hospital admission but developed acute retention in the hospital with pain in his suprapubic region and penis.  A catheter was placed with initial relief of his symptoms but subsequent pain radiating down his penis to the tip.  These symptoms occur intermittently and last for about 40 seconds at a time before relenting.  This is now his primary complaint.  He also developed dependent edema since his hospitalization including severe bilateral lower extremity edema and penoscrotal edema. His pain symptoms have not responded to anticholinergic therapy, prn B&O suppositories, or lidocaine jelly.  He is also receiving IV hydromorphone and a fentanyl patch.  He is now on tamsulosin. He was not on any therapy for his LUTS prior to admission.  He has previously developed postoperative urinary retention after other surgeries.   Past Medical History:  Diagnosis Date  . Adenomatous colon polyp   . Alpha-1-antitrypsin deficiency ([place])   . Alpha-1-antitrypsin deficiency carrier   . Anemia   . Anxiety   . Arthritis    "knees; bad in my back" (09/01/2014)  . Bruises easily   . Chronic bronchitis ([place])    "get it pretty much q yr" (09/01/2014)  . Chronic lower back pain   . Coronary artery disease   . Depression    "related to health" (09/01/2014)  . Diarrhea    has had a part of  colon removed  . Diverticulitis   . Diverticulosis   . Dyspnea on exertion   . Dysrhythmia    bradycardia in the 30's (01/11/2012).. Afib briefly after CABG  . Esophageal dysmotility   . Esophageal stricture   . Gastritis   . GERD (gastroesophageal reflux disease)    takes Pantoprazole daily  . Gout   . Hemorrhoids   . Hiatal hernia   . History of colon polyps   . Hyperlipidemia    doesn't require meds  . Hypertension    takes Losartan daily; current not taking   . IC (interstitial cystitis)   . Insomnia    takes Nortrypyline nightly  . Joint pain   . Joint swelling   . Macular hole of both eyes   . Nocturia   . OSA (obstructive sleep apnea)    "mild; tried CPAP; couldn't tolerate" (09/01/2014)  . Paroxysmal atrial fibrillation ([facility])   . Restless leg syndrome   . Sciatic nerve pain   . Type II diabetes mellitus ([place])    "borderline"  . Urinary frequency     Past Surgical History:  Procedure Laterality Date  . [place] VITRECTOMY WITH 20 GAUGE MVR PORT FOR MACULAR HOLE Right 10/08/2012   Procedure: 25 GAUGE PARS PLANA VITRECTOMY WITH 20 GAUGE MVR PORT FOR MACULAR HOLE; Membrame peel, serum patch; Laser treatment ; Gas exchange;  Surgeon: Hayden Pedro, MD;  Location: [facility];  Service: Ophthalmology;  Laterality: Right;  .  [place] VITRECTOMY WITH 20 GAUGE MVR PORT FOR MACULAR HOLE Left 09/01/2014   Procedure: 25 GAUGE PARS PLANA VITRECTOMY WITH 20 GAUGE MVR PORT FOR MACULAR HOLE;  Surgeon: Hayden Pedro, MD;  Location: [facility];  Service: Ophthalmology;  Laterality: Left;  . ANTERIOR CERVICAL DECOMP/DISCECTOMY FUSION N/A 12/20/2015   Procedure: C5-6 Anterior Cervical Discectomy and Fusion, Allograft, and Plate;  Surgeon: [name], MD;  Location: [facility];  Service: Orthopedics;  Laterality: N/A;  . APPENDECTOMY  2009  . bladder lesion removed    . CARDIAC CATHETERIZATION  01/11/2012  . CATARACT EXTRACTION W/ INTRAOCULAR LENS  IMPLANT, BILATERAL  Bilateral 2011  . CHOLECYSTECTOMY  2009  . COLONOSCOPY    . COMPRESSION HIP SCREW Left 04/04/2014   Procedure: COMPRESSION HIP LEFT;  Surgeon: Mcarthur Rossetti, MD;  Location: [facility];  Service: Orthopedics;  Laterality: Left;  . CORONARY ANGIOPLASTY    . CORONARY ARTERY BYPASS GRAFT  01/18/2012   Procedure: CORONARY ARTERY BYPASS GRAFTING (CABG);  Surgeon: Ivin Poot, MD;  Location: [facility];  Service: Open Heart Surgery;  Laterality: N/A;  times four, using left internal mammary artery  . CYSTOSCOPY WITH URETEROSCOPY  2008; 2009   "painted inside of bladder wall"  . ELBOW SURGERY Right 1988   "reconstructive OR"  . ENDOVEIN HARVEST OF GREATER SAPHENOUS VEIN  01/18/2012   Procedure: ENDOVEIN HARVEST OF GREATER SAPHENOUS VEIN;  Surgeon: Ivin Poot, MD;  Location: [facility];  Service: Open Heart Surgery;  Laterality: Right;  . ESOPHAGOGASTRODUODENOSCOPY    . ESOPHAGOGASTRODUODENOSCOPY N/A 06/05/2014   Procedure: ESOPHAGOGASTRODUODENOSCOPY (EGD);  Surgeon: Irene Shipper, MD;  Location: [facility];  Service: Endoscopy;  Laterality: N/A;  . EXCISION/RELEASE BURSA HIP Left 07/30/2015   Procedure: EXCISION/RELEASE BURSA HIP;  Surgeon: Mcarthur Rossetti, MD;  Location: [facility];  Service: Orthopedics;  Laterality: Left;  . EXPLORATORY LAPAROTOMY  2009   with right colectomy  . FRACTURE SURGERY    . GAS INSERTION Left 09/01/2014   Procedure: INSERTION OF GAS;  Surgeon: Hayden Pedro, MD;  Location: [facility];  Service: Ophthalmology;  Laterality: Left;  C3F8  . GAS/FLUID EXCHANGE Left 09/01/2014   Procedure: GAS/FLUID EXCHANGE;  Surgeon: Hayden Pedro, MD;  Location: [facility];  Service: Ophthalmology;  Laterality: Left;  . HARDWARE REMOVAL Left 07/30/2015   Procedure: attempted LEFT HIP HARDWARE REMOVAL, LEFT HIP BURSECTOMY;  Surgeon: Mcarthur Rossetti, MD;  Location: [facility];  Service: Orthopedics;  Laterality: Left;  . HEEL SPUR EXCISION Bilateral 1970's  . INTERTROCHANTERIC HIP FRACTURE  SURGERY Left 04/04/2014  . IR US GUIDE BX ASP/DRAIN  06/21/2018  . JOINT REPLACEMENT    . LEFT HEART CATH AND CORS/GRAFTS ANGIOGRAPHY N/A 05/28/2017   Procedure: LEFT HEART CATH AND CORS/GRAFTS ANGIOGRAPHY;  Surgeon: Belva Crome, MD;  Location: [facility];  Service: Cardiovascular;  Laterality: N/A;  . LEFT HEART CATHETERIZATION WITH CORONARY ANGIOGRAM N/A 01/11/2012   Procedure: LEFT HEART CATHETERIZATION WITH CORONARY ANGIOGRAM;  Surgeon: Leonie Man, MD;  [facility];  Service: Cardiovascular;  Laterality: N/A;  . LUMBAR [place] SURGERY  ?2010   "cleaned out the stenosis" (01/11/2012)  . LUMBAR LAMINECTOMY/DECOMPRESSION MICRODISCECTOMY N/A 05/10/2018   Procedure: redo lumbar decompression L3-4;  Surgeon: [name], MD;  Location: [facility];  Service: Orthopedics;  Laterality: N/A;  . MEMBRANE PEEL Left 09/01/2014   Procedure: MEMBRANE PEEL;  Surgeon: Hayden Pedro, MD;  Location: [facility];  Service: Ophthalmology;  Laterality: Left;  . NM MYOCAR MULTIPLE W/SPECT  04/09/2012   NORMAL STRESS NUCLEAR STUDY. EF 51%.  [name] PARS PLANA VITRECTOMY W/ REPAIR OF MACULAR HOLE Left 09/01/2014  . PARTIAL COLECTOMY     d/t diverticulosis  . PHOTOCOAGULATION WITH LASER Left 09/01/2014   Procedure: PHOTOCOAGULATION WITH LASER;  Surgeon: Hayden Pedro, MD;  Location: [facility];  Service: Ophthalmology;  Laterality: Left;  HEADSCOPE LASER  . RECONSTRUCTION MEDIAL COLLATERAL LIGAMENT ELBOW W/ TENDON GRAFT Right 1988 X 2  . SERUM PATCH Left 09/01/2014   Procedure: SERUM PATCH;  Surgeon: Hayden Pedro, MD;  Location: [facility];  Service: Ophthalmology;  Laterality: Left;  . TONSILLECTOMY AND ADENOIDECTOMY  ~1943  . TOTAL KNEE ARTHROPLASTY Bilateral ~ 2001; 2011   right; left  . TRANSTHORACIC ECHOCARDIOGRAM  01/04/2012   MODERATE SEVERE-SEVERE LVH. EF=>55%. MILDLY IMPAIRED LV RELAXATION. LA IS MODERATE TO SEVERE DILATED. MODERATE MR. MV LEAFLETS APPEAR THICHENED.  [name] TUMOR EXCISION Left 12/2011   arm     Medications:  Home meds:  No current facility-administered medications on file prior to encounter.    Current Outpatient Medications on File Prior to Encounter  Medication Sig Dispense Refill  . allopurinol (ZYLOPRIM) 300 MG tablet Take 300 mg by mouth daily.  2  . APPLE CIDER VINEGAR PO Take 1 capsule by mouth 2 (two) times daily.    [name] aspirin EC 81 MG tablet Take 81 mg by mouth daily.    [name] atorvastatin (LIPITOR) 40 MG tablet Take 40 mg by mouth at bedtime.     . calcium elemental as carbonate (TUMS ULTRA 1000) 400 MG chewable tablet Chew 4,000 mg by mouth daily.    [name] FIBER PO Take 3 capsules by mouth daily as needed (to bulk up stool).    . hydrocortisone cream 1 % Apply 1 application topically at bedtime as needed for itching (rash).    [name] loperamide (IMODIUM A-D) 2 MG tablet Take 6 mg by mouth daily as needed for diarrhea or loose stools.    . metoprolol succinate (TOPROL-XL) 50 MG 24 hr tablet Take 1 tablet (50 mg total) by mouth daily. (Patient taking differently: Take 50 mg by mouth at bedtime. ) 90 tablet 3  . OVER THE COUNTER MEDICATION Apply 1 application topically daily as needed (hip pain). CBD cream and/or tincture    . oxyCODONE-acetaminophen (PERCOCET/ROXICET) 5-325 MG tablet Take 1 tablet by mouth every 4 (four) hours as needed for severe pain. 30 tablet 0  . pantoprazole (PROTONIX) 40 MG tablet Take 1 tablet (40 mg total) by mouth 2 (two) times daily. 60 tablet 3  . tolnaftate (TINACTIN) 1 % cream Apply 1 application topically 3 (three) times daily as needed (itching/rash).     . predniSONE (STERAPRED UNI-PAK 21 TAB) 5 MG (21) TBPK tablet Take as instruction , take with food, 6 tablets the first day, then 5,4,3,2,1. Then stop (Patient not taking: Reported on 06/21/2018) 21 tablet 0     Scheduled Meds: . apixaban  10 mg Oral BID   Followed by  . [START ON 07/17/2018] apixaban  5 mg Oral BID  . dexamethasone  4 mg Oral Daily  . docusate sodium  100 mg Oral BID  .  dronabinol  2.5 mg Oral BID AC  . feeding supplement (ENSURE ENLIVE)  237 mL Oral BID BM  . feeding supplement (PRO-STAT SUGAR FREE 64)  30 mL Oral BID WC  . fentaNYL  1 patch Transdermal Q72H  .  furosemide  40 mg Intravenous Once  . insulin aspart  0-15 Units Subcutaneous TID WC  . insulin glargine  10 Units Subcutaneous QHS  . lidocaine  1 application Topical Once  . metoprolol succinate  50 mg Oral Daily  . multivitamin with minerals  1 tablet Oral Daily  . pantoprazole  40 mg Oral BID  . senna  1 tablet Oral QHS  . sodium bicarbonate  1,300 mg Oral BID  . sodium chloride flush  10-40 mL Intracatheter Q12H  . sodium chloride flush  3 mL Intravenous Q12H  . tamsulosin  0.4 mg Oral Daily   Continuous Infusions: . sodium chloride     PRN Meds:.sodium chloride, acetaminophen, alum & mag hydroxide-simeth, bisacodyl, HYDROmorphone (DILAUDID) injection, HYDROmorphone, LORazepam, menthol-cetylpyridinium, neomycin-bacitracin-polymyxin, ondansetron **OR** ondansetron (ZOFRAN) IV, opium-belladonna, sodium chloride flush, sodium chloride flush  Allergies:  Allergies  Allergen Reactions  . Morphine Itching    Family History  Problem Relation Age of Onset  . Emphysema Mother   . Kidney disease Father   . Emphysema Maternal Grandfather   . Colon cancer Neg Hx     Social History:  reports that he has never smoked. He has never used smokeless tobacco. He reports current alcohol use. He reports that he does not use drugs.  ROS: A complete review of systems was performed.  All systems are negative except for pertinent findings as noted.  Physical Exam:  Vital signs in last 24 hours: Temp:  [97.6 F (36.4 C)-98.3 F (36.8 C)] 97.6 F (36.4 C) (04/25 0521) Pulse Rate:  [75-96] 75 (04/25 0521) Resp:  [17-20] 17 (04/25 0521) BP: (115-129)/(75-83) 129/83 (04/25 0521) SpO2:  [96 %-99 %] 98 % (04/25 0521) Constitutional:  Alert and oriented, No acute distress Cardiovascular: No  JVD Respiratory: Normal respiratory effort GI: Abdomen is soft, nontender, nondistended, no abdominal masses Genitourinary: No CVAT. He has severe penoscrotal edema.  His glans penis is not visible.  He has and indwelling catheter with mostly clear urine (some red tinged). Rectal: Normal sphincter tone, no rectal masses, prostate is non tender and without nodularity. Prostate size is estimated to be 40 cc Lymphatic: No lymphadenopathy Neurologic: Grossly intact, no focal deficits Psychiatric: Normal mood and affect  Laboratory Data:  Recent Labs    07/11/18 0527 07/12/18 0356 07/13/18 0355  WBC 8.8 8.1 7.3  HGB 10.9* 10.4* 10.6*  HCT 33.9* 32.4* 33.4*  PLT 137* 124* 129*    Recent Labs    07/11/18 0527 07/12/18 0356 07/13/18 0355  NA 137 139 138  K 3.7 3.8 3.8  CL 109 111 109  GLUCOSE 102* 95 104*  BUN 28* 24* 23  CALCIUM 6.1* 5.9* 6.1*  CREATININE 0.85 0.77 0.78     Results for orders placed or performed during the hospital encounter of 06/21/18 (from the past 24 hour(s))  Glucose, capillary     Status: Abnormal   Collection Time: 07/12/18 11:28 AM  Result Value Ref Range   Glucose-Capillary 119 (H) 70 - 99 mg/dL  Glucose, capillary     Status: Abnormal   Collection Time: 07/12/18  5:06 PM  Result Value Ref Range   Glucose-Capillary 140 (H) 70 - 99 mg/dL  Glucose, capillary     Status: Abnormal   Collection Time: 07/12/18  8:24 PM  Result Value Ref Range   Glucose-Capillary 110 (H) 70 - 99 mg/dL  Lactate dehydrogenase     Status: Abnormal   Collection Time: 07/13/18  3:55 AM  Result Value Ref  Range   LDH 237 (H) 98 - 192 U/L  CBC with Differential/Platelet     Status: Abnormal   Collection Time: 07/13/18  3:55 AM  Result Value Ref Range   WBC 7.3 4.0 - 10.5 K/uL   RBC 3.28 (L) 4.22 - 5.81 MIL/uL   Hemoglobin 10.6 (L) 13.0 - 17.0 g/dL   HCT 33.4 (L) 39.0 - 52.0 %   MCV 101.8 (H) 80.0 - 100.0 fL   MCH 32.3 26.0 - 34.0 pg   MCHC 31.7 30.0 - 36.0 g/dL   RDW  16.2 (H) 11.5 - 15.5 %   Platelets 129 (L) 150 - 400 K/uL   nRBC 0.0 0.0 - 0.2 %   Neutrophils Relative % 84 %   Neutro Abs 6.1 1.7 - 7.7 K/uL   Lymphocytes Relative 12 %   Lymphs Abs 0.9 0.7 - 4.0 K/uL   Monocytes Relative 3 %   Monocytes Absolute 0.2 0.1 - 1.0 K/uL   Eosinophils Relative 0 %   Eosinophils Absolute 0.0 0.0 - 0.5 K/uL   Basophils Relative 0 %   Basophils Absolute 0.0 0.0 - 0.1 K/uL   Immature Granulocytes 1 %   Abs Immature Granulocytes 0.05 0.00 - 0.07 K/uL  Comprehensive metabolic panel     Status: Abnormal   Collection Time: 07/13/18  3:55 AM  Result Value Ref Range   Sodium 138 135 - 145 mmol/L   Potassium 3.8 3.5 - 5.1 mmol/L   Chloride 109 98 - 111 mmol/L   CO2 21 (L) 22 - 32 mmol/L   Glucose, Bld 104 (H) 70 - 99 mg/dL   BUN 23 8 - 23 mg/dL   Creatinine, Ser 0.78 0.61 - 1.24 mg/dL   Calcium 6.1 (LL) 8.9 - 10.3 mg/dL   Total Protein 5.2 (L) 6.5 - 8.1 g/dL   Albumin 2.7 (L) 3.5 - 5.0 g/dL   AST 56 (H) 15 - 41 U/L   ALT 100 (H) 0 - 44 U/L   Alkaline Phosphatase 463 (H) 38 - 126 U/L   Total Bilirubin 0.9 0.3 - 1.2 mg/dL   GFR calc non Af Amer >60 >60 mL/min   GFR calc Af Amer >60 >60 mL/min   Anion gap 8 5 - 15  Magnesium     Status: None   Collection Time: 07/13/18  3:55 AM  Result Value Ref Range   Magnesium 1.8 1.7 - 2.4 mg/dL  Phosphorus     Status: None   Collection Time: 07/13/18  3:55 AM  Result Value Ref Range   Phosphorus 2.8 2.5 - 4.6 mg/dL  Glucose, capillary     Status: None   Collection Time: 07/13/18  8:03 AM  Result Value Ref Range   Glucose-Capillary 84 70 - 99 mg/dL   Comment 1 Notify RN    No results found for this or any previous visit (from the past 240 hour(s)).  Renal Function: Recent Labs    07/07/18 0349 07/10/18 0516 07/11/18 0527 07/12/18 0356 07/13/18 0355  CREATININE 0.95 0.82 0.85 0.77 0.78   Estimated Creatinine Clearance: 97.1 mL/min (by C-G formula based on SCr of 0.78 mg/dL).  Radiologic Imaging: No  results found.  I independently reviewed the above imaging studies.  Impression/Recommendation 1) Urinary retention: Agree with alpha blocker therapy.  I am hesitant to have him proceed with a voiding trial right now because catheterization would be difficult if he cannot void due to edema.  As such, CIC also not a great option right  now.  Hope to proceed with a voiding trial with improvement of penoscrotal edema but may consider this early this week regardless.  2) Penoscrotal edema: Related to dependent edema.  Recommend maintaining good scrotal elevation with a rolled up towel under scrotum to help reduce edema.  3) Penile pain: His description is mostly likely bladder spasms with radiation to this penis although he may have a component of local irritation from catheter at tip of penis.  I would stop anticholinergic as he has not found this helpful and want to give him the best chance for a successful voiding trial in near future.  Will administer B&O suppositories around the clock right now to offer best help for bladder spasms.  Ok to use neosporin/vaseline or lidocaine jelly around tip of penis prn but will need to be applied to glans penis with finger inside edematous penile skin so it reaches glans penis.  Will follow.  [name] 07/13/2018, 11:08 AM    Pryor Curia MD  CC: Dr. Raiford Noble

## 2018-07-13 NOTE — Progress Notes (Addendum)
CRITICAL VALUE ALERT  Critical Value: calcium- 6.1  Date & Time Notied: 07/13/2018 @0515   Provider Notified: [place]heimer  Orders Received/Actions taken: 1g calcium gulconate IVPB x1

## 2018-07-13 NOTE — Progress Notes (Signed)
Daily Progress Note   Patient Name: Lucas Morales       Date: 4/33/2951 DOB: 01/26/1934  Age: 85 y.o. MRN#: [id number] Attending Physician: Kerney Elbe, DO Primary Care Physician: Deland Pretty, MD Admit Date: 06/21/2018  Reason for Consultation/Follow-up: Non pain symptom management, Pain control and Psychosocial/spiritual support  Subjective: We discussed his pain.  The back pain (cancer pain) seems controlled on the current regimen.  His difficulty with pain comes from pain in the tip of his penis.  Reports that B&O suppository seems to be most helpful thing over past 2 days.  Assessment: Over the last few days he has been relatively consistent with usage of pain medication.  In last 24 hours, has had total of 180 morphine eq for rescue meds.  Fentanyl increased to 85mcg/hr yesterday.  Patient Profile/HPI:  85 y.o. male  with past medical history of CAD, Afib, alpha 1 antitrypsin deficiency, interstitial cystitis (with a tendency to have urinary retention), DM, depression and anxiety who was admitted on 06/21/2018 with what was thought to be a post op infection at L3-L4.  Work up has revealed widespread (lungs, liver, pelvis, bone) metastatic cancer of undetermined primary.  Lucas Morales is going for a biopsy this morning.   PMT has been asked to consult for symptom management.    Length of Stay: 22  Current Medications: Scheduled Meds:  . apixaban  10 mg Oral BID   Followed by  . [START ON 07/17/2018] apixaban  5 mg Oral BID  . dexamethasone  4 mg Oral Daily  . docusate sodium  100 mg Oral BID  . dronabinol  2.5 mg Oral BID AC  . feeding supplement (ENSURE ENLIVE)  237 mL Oral BID BM  . feeding supplement (PRO-STAT SUGAR FREE 64)  30 mL Oral BID WC  . fentaNYL  1 patch  Transdermal Q72H  . insulin aspart  0-15 Units Subcutaneous TID WC  . insulin glargine  10 Units Subcutaneous QHS  . lidocaine  1 application Topical Once  . metoprolol succinate  50 mg Oral Daily  . multivitamin with minerals  1 tablet Oral Daily  . opium-belladonna  1 suppository Rectal Q8H  . pantoprazole  40 mg Oral BID  . senna  1 tablet Oral QHS  . sodium bicarbonate  1,300 mg Oral BID  .  sodium chloride flush  10-40 mL Intracatheter Q12H  . sodium chloride flush  3 mL Intravenous Q12H  . tamsulosin  0.4 mg Oral Daily    Continuous Infusions: . sodium chloride      PRN Meds: sodium chloride, acetaminophen, alum & mag hydroxide-simeth, bisacodyl, HYDROmorphone (DILAUDID) injection, HYDROmorphone, LORazepam, menthol-cetylpyridinium, neomycin-bacitracin-polymyxin, ondansetron **OR** ondansetron (ZOFRAN) IV, sodium chloride flush, sodium chloride flush  Physical Exam        Well developed, awake, alert, appropriate, NAD CV slightly irreg rhythm but regular rate, no m/r/g Resp no distress.  No w/c/r Abdomen soft, nd, nt, +bs.    Vital Signs: BP 119/82 (BP Location: Left Arm)   Pulse 94   Temp 98.3 F (36.8 C) (Oral)   Resp 19   Ht 6\' 2"  (1.88 m)   Wt 117.6 kg   SpO2 95%   BMI 33.29 kg/m  SpO2: SpO2: 95 % O2 Device: O2 Device: Room Air O2 Flow Rate: O2 Flow Rate (L/min): 2 L/min  Intake/output summary:   Intake/Output Summary (Last 24 hours) at 07/13/2018 2043 Last data filed at 07/13/2018 2028 Gross per 24 hour  Intake 700 ml  Output 4200 ml  Net -3500 ml   LBM: Last BM Date: 07/09/18 Baseline Weight: Weight: 101.3 kg Most recent weight: Weight: 117.6 kg       Palliative Assessment/Data:  50%    Flowsheet Rows     Most Recent Value  Intake Tab  Referral Department  Oncology  Unit at Time of Referral  Med/Surg Unit  Palliative Care Primary Diagnosis  Cancer  Date Notified  06/26/18  Palliative Care Type  New Palliative care  Reason for referral   Non-pain Symptom  Date of Admission  06/21/18  Date first seen by Palliative Care  06/27/18  # of days Palliative referral response time  1 Day(s)  # of days IP prior to Palliative referral  5  Clinical Assessment  Psychosocial & Spiritual Assessment  Palliative Care Outcomes      Patient Active Problem List   Diagnosis Date Noted  . Lung nodules 07/03/2018  . Bladder spasm   . Metastatic cancer ([place])   . Pelvic mass   . Type II diabetes mellitus ([place]) 06/27/2018  . Coronary artery disease 06/27/2018  . AKI (acute kidney injury) ([place]) 06/27/2018  . Cancer associated pain   . DNR (do not resuscitate)   . Palliative care encounter   . Deep venous thrombosis ([place]) 06/26/2018  . 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 Status post lumbar spine surgery for decompression of spinal cord 05/24/2018  . Abnormal EKG 05/25/2017  . S/P cervical spinal fusion 01/25/2016  . Cervical spinal stenosis 12/20/2015  . Cervical spondylosis 12/20/2015  . Painful orthopaedic hardware left hip with chronic trochanteric bursitis 07/30/2015  . Hip bursitis 07/30/2015  . Trochanteric bursitis of left hip 07/30/2015  . Chest pain with moderate risk of acute coronary syndrome 10/19/2014  . Macular hole, left eye 09/01/2014  . Macular hole of left eye 08/28/2014  . Other pancytopenia ([place])   . Sliding hiatal hernia   . Gastric erosions   . GI bleeding 06/04/2014  . Acute GI bleeding 06/04/2014  . Orthostatic hypotension 06/04/2014  . Acute upper gastrointestinal bleeding 06/04/2014  . Other specified diabetes mellitus without complications ([place])   . Acute blood loss anemia   . Thrombocytopenia ([place])   . Closed left hip fracture ([place]) 04/03/2014  . Fracture, femur ([place]) 04/03/2014  . Femur fracture ([place])  04/03/2014  . Upper airway cough syndrome 05/16/2013  . OSA (obstructive sleep apnea) 10/21/2012  . Macular hole 10/08/2012  . Preoperative clearance 09/30/2012  . PAF (paroxysmal atrial  fibrillation) ([place]) 01/21/2012  . Shortness of breath on exertion 01/11/2012  . Bradycardia, sinus 01/11/2012  . Hx of CABG 01/11/2012  . Essential hypertension   . Dyslipidemia   . ABDOMINAL PAIN-LLQ 02/04/2010  . HEMORRHOIDS 08/31/2008  . ESOPHAGEAL STRICTURE 08/31/2008  . GERD 08/31/2008  . Gastritis and gastroduodenitis 08/31/2008  . HIATAL HERNIA 08/31/2008  . DIVERTICULOSIS, COLON 08/31/2008  . Chronic interstitial cystitis 08/31/2008  . COLONIC POLYPS, ADENOMATOUS, HX OF 08/31/2008    Palliative Care Plan    Recommendations/Plan:  Increased fenantyl patch to 65mcg/hr yesterday.  B&O scheduled by urology.  Has prn PO and IV dilaudid.  Continue same.  Please place a thigh strap to stabilize foley cath when he is out of bed.  PMT will continue to check in intermittently for symptom management.  Goals of Care and Additional Recommendations:  Limitations on Scope of Treatment: Full Scope Treatment  Code Status:  DNR  Prognosis:   < 6 months due to high grade widely metastatic carcinoma   Discharge Planning:  To his home he will have 24 hour family care.  He will need home health services if he opts for further oncologic therapy OR hospice services in the home if he opts for supportive care (no chemo or IV treatments).  Care plan was discussed with patient, PMT attending, bedside RN  Thank you for allowing the Palliative Medicine Team to assist in the care of this patient.  Total time spent:  35 min.     Greater than 50%  of this time was spent counseling and coordinating care related to the above assessment and plan.  Micheline Rough, MD [place] Team [phone]   Please contact Palliative MedicineTeam phone at [phone] for questions and concerns between 7 am - 7 pm.   Please see AMION for individual provider pager numbers.

## 2018-07-13 NOTE — TOC Progression Note (Signed)
Transition of Care [facility]) - Progression Note    Patient Details  Name: Lucas Morales MRN: [id number] Date of Birth: 02-26-1934  Transition [facility] CM/SW Contact  Servando Snare, [place] Phone Number: 07/13/2018, 10:06 AM  Clinical Narrative:  LCSW consulted for SNF placement.  Patient plan still to return home with son and [place]. Home Health arranged.   Please submit new consult if hospice is needed or CSW needs arise.     Expected Discharge Plan: [place] Barriers to Discharge: Continued Medical Work up  Expected Discharge Plan and Services Expected Discharge Plan: [place]   Discharge Planning Services: CM Consult Post Acute Care Choice: Home Health                                         Social Determinants of Health (SDOH) Interventions    Readmission Risk Interventions No flowsheet data found.

## 2018-07-14 DIAGNOSIS — R601 Generalized edema: Secondary | ICD-10-CM

## 2018-07-14 LAB — GLUCOSE, CAPILLARY
Glucose-Capillary: 102 mg/dL — ABNORMAL HIGH (ref 70–99)
Glucose-Capillary: 118 mg/dL — ABNORMAL HIGH (ref 70–99)
Glucose-Capillary: 243 mg/dL — ABNORMAL HIGH (ref 70–99)
Glucose-Capillary: 181 mg/dL — ABNORMAL HIGH (ref 70–99)

## 2018-07-14 LAB — LACTATE DEHYDROGENASE: LDH: 218 U/L — ABNORMAL HIGH (ref 98–192)

## 2018-07-14 LAB — COMPREHENSIVE METABOLIC PANEL
ALT: 94 U/L — ABNORMAL HIGH (ref 0–44)
AST: 54 U/L — ABNORMAL HIGH (ref 15–41)
Albumin: 2.4 g/dL — ABNORMAL LOW (ref 3.5–5.0)
Alkaline Phosphatase: 464 U/L — ABNORMAL HIGH (ref 38–126)
Anion gap: 8 (ref 5–15)
BUN: 25 mg/dL — ABNORMAL HIGH (ref 8–23)
CO2: 22 mmol/L (ref 22–32)
Calcium: 6.1 mg/dL — CL (ref 8.9–10.3)
Chloride: 107 mmol/L (ref 98–111)
Creatinine, Ser: 0.82 mg/dL (ref 0.61–1.24)
GFR calc Af Amer: >60 mL/min (ref >60)
GFR calc non Af Amer: >60 mL/min (ref >60)
Glucose, Bld: 112 mg/dL — ABNORMAL HIGH (ref 70–99)
Potassium: 3.8 mmol/L (ref 3.5–5.1)
Sodium: 137 mmol/L (ref 135–145)
Total Bilirubin: 0.8 mg/dL (ref 0.3–1.2)
Total Protein: 5 g/dL — ABNORMAL LOW (ref 6.5–8.1)

## 2018-07-14 LAB — CBC WITH DIFFERENTIAL/PLATELET
Abs Immature Granulocytes: 0.05 10*3/uL (ref 0.00–0.07)
Basophils Absolute: 0 10*3/uL (ref 0.0–0.1)
Basophils Relative: 0 %
Eosinophils Absolute: 0 10*3/uL (ref 0.0–0.5)
Eosinophils Relative: 0 %
HCT: 32.8 % — ABNORMAL LOW (ref 39.0–52.0)
Hemoglobin: 10.4 g/dL — ABNORMAL LOW (ref 13.0–17.0)
Immature Granulocytes: 1 %
Lymphocytes Relative: 11 %
Lymphs Abs: 0.8 10*3/uL (ref 0.7–4.0)
MCH: 31.9 pg (ref 26.0–34.0)
MCHC: 31.7 g/dL (ref 30.0–36.0)
MCV: 100.6 fL — ABNORMAL HIGH (ref 80.0–100.0)
Monocytes Absolute: 0.2 10*3/uL (ref 0.1–1.0)
Monocytes Relative: 3 %
Neutro Abs: 5.6 10*3/uL (ref 1.7–7.7)
Neutrophils Relative %: 85 %
Platelets: 118 10*3/uL — ABNORMAL LOW (ref 150–400)
RBC: 3.26 MIL/uL — ABNORMAL LOW (ref 4.22–5.81)
RDW: 16.1 % — ABNORMAL HIGH (ref 11.5–15.5)
WBC: 6.6 10*3/uL (ref 4.0–10.5)
nRBC: 0 % (ref 0.0–0.2)

## 2018-07-14 LAB — PHOSPHORUS: Phosphorus: 2.9 mg/dL (ref 2.5–4.6)

## 2018-07-14 LAB — MAGNESIUM: Magnesium: 1.5 mg/dL — ABNORMAL LOW (ref 1.7–2.4)

## 2018-07-14 MED ORDER — CALCIUM GLUCONATE-NACL 1-0.675 GM/50ML-% IV SOLN
1.0000 g | Freq: Once | INTRAVENOUS | Status: AC
  Administered 2018-07-14: 1000 mg via INTRAVENOUS
  Filled 2018-07-14: qty 50

## 2018-07-14 MED ORDER — FUROSEMIDE 10 MG/ML IJ SOLN
40.0000 mg | Freq: Two times a day (BID) | INTRAMUSCULAR | Status: DC
  Administered 2018-07-14 – 2018-07-17 (×8): 40 mg via INTRAVENOUS
  Filled 2018-07-14 (×8): qty 4

## 2018-07-14 MED ORDER — FUROSEMIDE 10 MG/ML IJ SOLN: 40.0000 mg | Freq: Once | INTRAMUSCULAR | Status: DC

## 2018-07-14 MED ORDER — MAGNESIUM SULFATE 50 % IJ SOLN
3.0000 g | Freq: Once | INTRAVENOUS | Status: AC
  Administered 2018-07-14: 3 g via INTRAVENOUS
  Filled 2018-07-14: qty 6

## 2018-07-14 NOTE — Progress Notes (Signed)
Pt is refusing PM and AM B&O suppositories. States that he does not want to have BMs all night. RN explained the suppository is for bladder spasms, not a BM. The pt stated he would rather not take the chance tonight and reassess the need for it for his 0600 dose.

## 2018-07-14 NOTE — Progress Notes (Signed)
Chart reviewed.  It appears he is using increased PRNs and basal pain medication.    Mr. Lucas Morales is sound asleep.  I did not wake him.  Talked with bedside RN.  No new needs with regard to pain medication at this time.   PMT will re-visit tomorrow.  No charge note  Florentina Jenny, [place] Palliative Medicine Pager: [phone]

## 2018-07-14 NOTE — Progress Notes (Signed)
Patient ID: Lucas Morales, male   DOB: [birth year], 85 y.o.   MRN: [id number]    Subjective: Pt still with intermittent penile pain consistent with radiation of pain from bladder spasms.  Objective: Vital signs in last 24 hours: Temp:  [98.2 F (36.8 C)-98.3 F (36.8 C)] 98.2 F (36.8 C) (04/26 0529) Pulse Rate:  [62-98] 89 (04/26 0529) Resp:  [18-20] 18 (04/26 0529) BP: (115-138)/(70-82) 138/78 (04/26 0529) SpO2:  [84 %-98 %] 97 % (04/26 0529)  Intake/Output from previous day: 04/25 0701 - 04/26 0700 In: 700 [P.O.:700] Out: 4100 [Urine:4100] Intake/Output this shift: No intake/output data recorded.  Physical Exam:  General: Alert and oriented GU: Severe penoscrotal edema remains, scrotum not currently elevated, catheter in placed draining well.  Lab Results: Recent Labs    07/12/18 0356 07/13/18 0355 07/14/18 0400  HGB 10.4* 10.6* 10.4*  HCT 32.4* 33.4* 32.8*   BMET Recent Labs    07/13/18 0355 07/14/18 0400  NA 138 137  K 3.8 3.8  CL 109 107  CO2 21* 22  GLUCOSE 104* 112*  BUN 23 25*  CREATININE 0.78 0.82  CALCIUM 6.1* 6.1*     Studies/Results: No results found.  Assessment/Plan: 1) Penile pain: Most consistent with bladder spasms.  Patient was hesitant to allow OTC B&O suppositories but now willing to do this after further explanation.  This is helping best.  Continue topical therapy prn as he wishes.  Cannot consider catheter removal until edema improves.  2) Penoscrotal edema: Scrotum was not elevated when examined this morning.  It is essential that his scrotum remain elevated as much as possible to help with edema.  His back is feeling better and he is willing and more able to ambulate now which also will likely significantly help with resolution of edema.  Not sure if diuresis would be helpful as well as this is not a localized process.  3) Urinary retention: Continue alpha blocker.  Will consider voiding trial once penoscrotal edema  improves.    LOS: 23 days   [name] 07/14/2018, 9:28 AM

## 2018-07-14 NOTE — Progress Notes (Signed)
Patient had uncontrolled pain partial night shift, asking for pain medicine constanly. Both PO dilaudid and IV  dilaudid given, still uncontrolled. Refused suppository, stated wants to talk to doctor. Report given to day RN.

## 2018-07-14 NOTE — Progress Notes (Signed)
PROGRESS NOTE    Lucas Morales  FIE:[id number] DOB: Nov 17, 1933 DOA: 06/21/2018 PCP: Deland Pretty, MD   Brief Narrative: Lucas Morales is a 85 y.o. malewith medical history significant of adenomatous polyps, alpha-1 antitrypsin deficiency carrier, anemia, anxiety, depression, osteoarthritis of the knees and back, easy bruising, chronic bronchitis, chronic lower back pain, CAD, history of frequent diarrhea due to partial colectomy, diverticulosis, episodes of diverticulitis, hemorrhoids, gastritis, hiatal hernia, GERD, esophageal stricture, esophageal dysmotility, history of post CABG paroxysmal A. fib, history of bradycardia, hyperlipidemia, hypertension, OSA not on CPAP due to intolerance, restless leg syndrome, sciatic nerve pain, type 2 diabetes, urinary frequency whohad lumbar decompression L3-4 on 05/10/2018 and was admitted about a week ago due to suspected L3 vertebral body osteomyelitis. However, after reviewing all imaging and getting new imaging studies, it was determined that the patient had a metastatic lesion. 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 He was seen by oncology 3 days ago. He underwent biopsy of the area with interventional radiology. We are seeinghimconsultdue to AKI, worsening LFTs and multiple medical issues.   Developed AKI: Suspect multifactorial secondary to contrast and dehydration. Patient was transferred from [facility] on 4/14 to start radiation treatment to pelvis mass.  **Interim History He started radiation treatments and is on day 6 of 10 and will resume on Monday 07/15/2018.  Continues to have significant scrotal swelling and feels uncomfortable ambulating with it. Had severe electrolyte abnormalities that are currently being replete. Molecular Genetic Studies done and had nothing that is targetable but is undergoing mollecular array analysis that may delineate origination of tumor.  He is wanting a second opinion and Dr.  Marin Olp is to arrange this once his radiation treatments are finished.  He is to have some pain and palliative care is adjusting his medications still.  Urology was consulted given his penile swelling and scrotal swelling and pain and recommending scheduled BNO suppositories and discontinuing Anticholinergic Therapy.  Had originally refused a BNO suppository but now willing to take it scheduled.  Mains volume overloaded and anasarca so have started IV diuresis twice daily today and will reassess in the a.m.  Assessment & Plan:   Principal Problem:   Pelvic mass Active Problems:   Essential hypertension   Dyslipidemia   PAF (paroxysmal atrial fibrillation) (HCC)   OSA (obstructive sleep apnea)   Deep venous thrombosis (HCC)   Cancer associated pain   DNR (do not resuscitate)   Type II diabetes mellitus ([place])   Coronary artery disease   AKI (acute kidney injury) ([place])   Metastatic cancer (HCC)   Lung nodules   Bladder spasm  Pelvic mass with Metastasis  Multiple liver masses Multiple lung nodules -Patient initially admitted following lumbar decompression L3-4 on 05/10/2018 with suspected L3 osteomyelitis.   -CT pelvis notable for a 13 x 10 x 10 cm mass arising from the right ischium extending into the posterior aspect of the right acetabulum and adjacent soft tissues. 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 -Further molecular studies per oncology which came back and per Oncology unfortunately they have nothing that they can target and feel like he will need Chemotherapy; Molecular Array Analysis is still pending  -Differential for primary lesion include lung primary vs pelvic sarcoma vs cholangiocarcinoma and Medical  Oncology leaning  towards Cholangiocarcinoma -MR brain negative for metastatic process -Oncology believes this is an extremely aggressive tumor with prognosis 2-3 months -continue XRT; completed #6/10 fractions; Last day planned 07/17/2018 -Continued treatment dose Lovenox for hypercoagulable state given malignancy but changed to Apixaban given thrombocytopenia and he is tolerating Apixaban now -Family wanting a second opinion and Dr. Marin Olp to arrange once Radiation Treatments are finished   Pain of Malignancy -Pain is Somewhat uncontrolled and pain regimen will need to be adjusted -Continue Pain Control with Fentanyl Patch but this was increased from 25 mcg to 50 mcg -C/w Dilaudid 4 mg q4hprn Moderate Pain and Decadron 4 mg p.o. daily -C/w Acetaminophen 650 mg po q6hprn Moderate Pain -C/w Dilaudid IV 1 mg q1hprn for breakthrough pain; Palliative decreasing dose to 0.5 mg q1hprn -C/w Palliative care assisting with pain management and Dr. Marin Olp adjusting medications; Palliative to reassess aagain -C/w BNO Suppositories   Acute Renal Failure, improved and resolved resolved -Creatinine elevated to a high of 1.94 during hospitalization. -Etiology likely secondary to decreased oral intake and contrast exposure from CT abdomen/pelvis as well as urinary retention. -Renal ultrasound unrevealing. -Creatinine improving 1.94-->1.33-->1.16-->1.04-->0.95 and is now 0.82 (0.96 on admission) -Avoid nephrotoxins, renally dose all medications -Continue Foley catheter until edema improves; then will attempt voiding trial -Discontinued IVF now and gave a dose of IV Lasix 40 mg x1 -Continue to monitor renal function daily  Acute Urinary Retention and Massive Penile and Scrotal Swelling Bladder Spasms -Etiology likely secondary to edema and pelvic mass with compression. -Continue Tamsulosin 0.4 mg p.o. daily -Continue Foley catheter until swelling improves then will attempt voiding trial -C/w Lidocaine, Oxybutynin now  discontinued, Opioids, and Belladonna Suppositories -Urology Consulted appreciate further evaluation and recommendations; Dr. Alinda Money evaluated patient and agrees with alpha blocker therapy and is hesitant to having him proceed with a voiding trial right now because catheterization will be difficult if he cannot void there is edema and he believes a CIC is also not a great option right now but he hopes to proceed with a voiding trial with improvement of his penoscrotal edema and will consider early this week but cannot consider catheter removal until edema improves -Dr. Alinda Money also recommending scrotal elevation with a rolled up towel in his scrotum to help reduce edema -Dr. Alinda Money recommending stopping anticholinergic therapy with oxybutynin and recommends administering BNO suppositories around-the-clock so have changed it to q8h.  Also states is okay to use Neosporin/Vaseline or lidocaine jelly around the tip of the penis -Started IV diuresis and given IV Lasix yesterday and have scheduled her placed today -Continue to Ambulate and work with PT/OT  L3 lesion -Likely metastatic disease and not consistent with infection, -Infectious disease initially consulted during hospitalization, antibiotics have subsequently discontinued.  Hypertension -C/w Metoprolol succinate 50 mg p.o. daily -BP now 138/78  HLD -Continue holding statin due to abnormal LFTs.  Paroxysmal A. Fib -Continue Metoprolol Succinate 50 mg p.o. daily -Was on Anticoagulation with Full Dose Lovenox and will change to po Apixaban  OSA -Not on CPAP. Continue with oxygen supplementation prn  Acute DVT, Right Soleal Vein  -US Duplex doppler with findings consistent with acute deep vein thrombosis involving the right soleal vein. -Changed to Eliquis from Full dose Lovenox  Type 2 Diabetes  -C/w Lantus 10 units [place] qHS -C/w Moderate Novolog SSI AC  -Continue to Monitor Glucose closely while on Decadron -CBG's ranging from  84-209  Coronary Artery Disease -Continue beta-blocker with Metoprolol Succinate 50 mg po Daily  Macrocytic Anemia -Patient's Hb/Hct stable at 10.4/32.8 -Continue to Monitor for S/Sx of Bleeding  -Checked Anemia Panel and showed iron level 61, U IBC of 211, TIBC of 272, saturation of 22%, ferritin level 269, folate level 11.9, vitamin B12 level of 1473 -Repeat CBC in AM   Abnormal LFT's/Transaminitis, slightly worsened  -Suspect related to liver masses -AST is now 54 (56) and ALT is now 94 (100) -Continue to Monitor and Trend Hepatic Function -Repeat CMP in AM   Hyponatremia -Improved with IV fluids and is now 137 -Discontinued IV Maintenance Fluids at 45 mL/hr -Continue to Monitor and Trend Na+ and repeat CMP in AM.  Hypokalemia, improved  -Patient's K+ was 3.8 -Continue to monitor and replete as necessary -Repeat CMP in the a.m.  Hypocalcemia -Patient's Ca2+ was 6.1 -Replete with IV Calcium Gluconate 1 gram x1 today -Corrected Ca2+ for Albumin was 7.4 -Continue to Monitor and Replete as Necessary  -Repeat Ca2+ in AM   Hypomagnesemia -Patient magnesium level this morning was 1.5 -Replete with IV Mag Sulfate 3 Grams  -Continue monitor and replete as necessary -Repeat magnesium level in the a.m.  Hypophosphatemia -Patient's phosphorus levels morning was 2.9 -Continue monitor replete as necessary -Repeat phosphorus level in the a.m.  Metabolic Acidosis, improved  -Continue Sodium Bicarbonate 1300mg  PO BID -CO2 now 22 and AG is 8  Thrombocytopenia  -Platelet Count had been dropping and is now fluctuating  -Had been on Full Dose Lovenox -Concern for HIT so will send Ab; Pharmacy Notified and switched Anticoagulation to Eliquis -Platelet Count now 118,000;  -Continue to Monitor for S/Sx of Bleeding -Repeat CBC in AM  Lower Extremity Swelling/Anasaraca -In the setting of his cancer and poor albumin status -Patient is +16.012 Liters since Admission  -We will  consult nutrition for further evaluation recommendations -Continue with feeding supplements of Ensure Enlive 237 mils p.o. twice daily as well as Protostat 30 mL's p.o. twice daily -Given one-time dose of IV Lasix 40 mg yestrerday and will schedule Lasix 40 mg IV BID today and re-assess in AM  -Continue to monitor  Obesity -Estimated body mass index is 33.29 kg/m as calculated from the following:   Height as of this encounter: 6\' 2"  (1.88 m).   Weight as of this encounter: 117.6 kg. -Nutritionist on board as patient is having poor p.o. intake and was started on Dronabinol 2.5 po BID -Nutritionist following and recommending continue Ensure Enlive p.o. twice daily, pro-stat 30 mL's p.o. twice daily, multivitamin with mineral daily, as well as Magic cup 3 times daily  DVT prophylaxis: Anticoagulated with Apixaban   Code Status: DO NOT RESUSCITATE Family Communication: No family at bedside and updated daughter over the phone Disposition Plan: Continue Radiation Treatment and PT recommends SNF versus home health; will likely remain inpatient for radiation treatments with planned completion on 07/17/2018  Consultants:   Palliative Care Medicine   Medical Oncology   Radiation Oncology  Orthopedic Surgery  Interventional Radiology  Urology   Procedures:  CT guided biopsy lytic right pelvic bone lesion on 06/27/2018 by IR  Antimicrobials:  Anti-infectives (From admission, onward)   Start     Dose/Rate Route Frequency Ordered Stop   06/27/18 1000  fluconazole (DIFLUCAN) tablet 100 mg  Status:  Discontinued     100 mg Oral Daily 06/27/18 0625 07/05/18 0944   06/24/18 0700  vancomycin (VANCOCIN) 1,250 mg in sodium chloride 0.9 % 250 mL IVPB  Status:  Discontinued     1,250 mg 166.7 mL/hr over  90 Minutes Intravenous Every 12 hours 06/23/18 1916 06/25/18 0815   06/22/18 1300  cefTRIAXone (ROCEPHIN) 2 g in sodium chloride 0.9 % 100 mL IVPB  Status:  Discontinued     2 g 200 mL/hr over 30  Minutes Intravenous Every 24 hours 06/22/18 1217 06/25/18 0758   06/22/18 0700  vancomycin (VANCOCIN) 1,000 mg in sodium chloride 0.9 % 250 mL IVPB  Status:  Discontinued     1,000 mg 250 mL/hr over 60 Minutes Intravenous Every 12 hours 06/21/18 1813 06/23/18 1916   06/22/18 0300  piperacillin-tazobactam (ZOSYN) IVPB 3.375 g  Status:  Discontinued     3.375 g 12.5 mL/hr over 240 Minutes Intravenous Every 8 hours 06/21/18 1813 06/22/18 1217   06/21/18 1700  piperacillin-tazobactam (ZOSYN) IVPB 3.375 g     3.375 g 100 mL/hr over 30 Minutes Intravenous  Once 06/21/18 1650 06/21/18 1847   06/21/18 1700  vancomycin (VANCOCIN) 2,000 mg in sodium chloride 0.9 % 500 mL IVPB     2,000 mg 250 mL/hr over 120 Minutes Intravenous  Once 06/21/18 1658 06/21/18 2200     Subjective: Seen and examined at bedside and initially refused his BNO suppository last night and had some uncontrolled pain during part of the shift but pain was better controlled today and was a 4 out of 10.  No nausea or vomiting.  Thinks his swelling might of been slightly improved with IV Lasix.  No chest pain, shortness of breath.  No other concerns or complaints at this time and had a better mood and affect this morning.   Objective: Vitals:   07/11/18 0530 07/12/18 1100 07/13/18 1900 07/14/18 0529  BP: 115/71  119/82 138/78  Pulse: 92  94 89  Resp: 19  19 18   Temp: 97.9 F (36.6 C)  98.3 F (36.8 C) 98.2 F (36.8 C)  TempSrc: Oral  Oral Oral  SpO2: 99% 96% 95% 97%  Weight:      Height:        Intake/Output Summary (Last 24 hours) at 07/14/2018 1119 Last data filed at 07/14/2018 4174 Gross per 24 hour  Intake 460 ml  Output 3700 ml  Net -3240 ml   Filed Weights   07/05/18 1133 07/08/18 1100 07/09/18 0800  Weight: 112 kg 119.6 kg 117.6 kg   Examination: Physical Exam:  Constitutional: Nourished, well-developed obese elderly Caucasian male who is not as anxious today and appears more calm still appears slightly  uncomfortable. Eyes: Lids and conjunctive are normal.  Sclera anicteric ENMT: External ears nose appear normal.  Mucous membranes appear moist Neck: Appears supple no JVD Respiratory: Diminished auscultation bilaterally no appreciable wheezing, rales, car.  Patient without tachypneic wheezing accessory muscle breathe Cardiovascular: Irregularly irregular rate but heart rate is controlled.  Has 2-3+ lower extremity edema up to his thigh with TED hose on Abdomen: Soft, nontender, distended secondary body habitus.  Bowel sounds present GU: Has a very large scrotal and penile swelling and an indwelling Foley catheter with darker color urine; swelling appears slightly improved from yesterday and he does have scrotal support with a towel Musculoskeletal: No contractures or cyanosis.  No joint deformities in the upper extremities but remains anasarcic  Skin: Skin is warm and dry no appreciable rashes or lesions limited skin evaluation Neurologic: Cranial nerves II through XII grossly intact no appreciable focal deficits Psychiatric: Not as anxious today but still has some depressed affect.  He is awake, alert, oriented x3  Data Reviewed: I have personally reviewed following  labs and imaging studies  CBC: Recent Labs  Lab 07/10/18 0516 07/11/18 0527 07/12/18 0356 07/13/18 0355 07/14/18 0400  WBC 8.1 8.8 8.1 7.3 6.6  NEUTROABS  --  7.5 7.0 6.1 5.6  HGB 9.5* 10.9* 10.4* 10.6* 10.4*  HCT 30.1* 33.9* 32.4* 33.4* 32.8*  MCV 103.1* 102.7* 101.6* 101.8* 100.6*  PLT 119* 137* 124* 129* 607*   Basic Metabolic Panel: Recent Labs  Lab 07/10/18 0516 07/11/18 0527 07/12/18 0356 07/13/18 0355 07/14/18 0400  NA 140 137 139 138 137  K 3.2* 3.7 3.8 3.8 3.8  CL 115* 109 111 109 107  CO2 20* 19* 22 21* 22  GLUCOSE 84 102* 95 104* 112*  BUN 25* 28* 24* 23 25*  CREATININE 0.82 0.85 0.77 0.78 0.82  CALCIUM 5.6* 6.1* 5.9* 6.1* 6.1*  MG 1.3* 1.8 1.5* 1.8 1.5*  PHOS 2.1* 2.2* 2.3* 2.8 2.9   GFR:  Estimated Creatinine Clearance: 94.7 mL/min (by C-G formula based on SCr of 0.82 mg/dL). Liver Function Tests: Recent Labs  Lab 07/10/18 0516 07/11/18 0527 07/12/18 0356 07/13/18 0355 07/14/18 0400  AST 51* 63* 59* 56* 54*  ALT 83* 103* 99* 100* 94*  ALKPHOS 306* 427* 445* 463* 464*  BILITOT 0.6 1.0 0.8 0.9 0.8  PROT 4.4* 5.4* 4.9* 5.2* 5.0*  ALBUMIN 2.1* 2.7* 2.4* 2.7* 2.4*   No results for input(s): LIPASE, AMYLASE in the last 168 hours. No results for input(s): AMMONIA in the last 168 hours. Coagulation Profile: No results for input(s): INR, PROTIME in the last 168 hours. Cardiac Enzymes: No results for input(s): CKTOTAL, CKMB, CKMBINDEX, TROPONINI in the last 168 hours. BNP (last 3 results) No results for input(s): PROBNP in the last 8760 hours. HbA1C: No results for input(s): HGBA1C in the last 72 hours. CBG: Recent Labs  Lab 07/13/18 0803 07/13/18 1113 07/13/18 1648 07/13/18 2031 07/14/18 0810  GLUCAP 84 115* 209* 130* 102*   Lipid Profile: No results for input(s): CHOL, HDL, LDLCALC, TRIG, CHOLHDL, LDLDIRECT in the last 72 hours. Thyroid Function Tests: No results for input(s): TSH, T4TOTAL, FREET4, T3FREE, THYROIDAB in the last 72 hours. Anemia Panel: No results for input(s): VITAMINB12, FOLATE, FERRITIN, TIBC, IRON, RETICCTPCT in the last 72 hours. Sepsis Labs: No results for input(s): PROCALCITON, LATICACIDVEN in the last 168 hours.  No results found for this or any previous visit (from the past 240 hour(s)).   RN Pressure Injury Documentation:    Estimated body mass index is 33.29 kg/m as calculated from the following:   Height as of this encounter: 6\' 2"  (1.88 m).   Weight as of this encounter: 117.6 kg.  Malnutrition Type:  Nutrition Problem: Increased nutrient needs Etiology: post-op healing   Malnutrition Characteristics:  Signs/Symptoms: estimated needs   Nutrition Interventions:  Interventions: MVI, Prostat, Magic cup, Boost Plus    Radiology Studies: No results found.  Scheduled Meds: . apixaban  10 mg Oral BID   Followed by  . [START ON 07/17/2018] apixaban  5 mg Oral BID  . dexamethasone  4 mg Oral Daily  . docusate sodium  100 mg Oral BID  . dronabinol  2.5 mg Oral BID AC  . feeding supplement (ENSURE ENLIVE)  237 mL Oral BID BM  . feeding supplement (PRO-STAT SUGAR FREE 64)  30 mL Oral BID WC  . fentaNYL  1 patch Transdermal Q72H  . furosemide  40 mg Intravenous BID  . insulin aspart  0-15 Units Subcutaneous TID WC  . insulin glargine  10 Units  Subcutaneous QHS  . lidocaine  1 application Topical Once  . metoprolol succinate  50 mg Oral Daily  . multivitamin with minerals  1 tablet Oral Daily  . opium-belladonna  1 suppository Rectal Q8H  . pantoprazole  40 mg Oral BID  . senna  1 tablet Oral QHS  . sodium bicarbonate  1,300 mg Oral BID  . sodium chloride flush  10-40 mL Intracatheter Q12H  . sodium chloride flush  3 mL Intravenous Q12H  . tamsulosin  0.4 mg Oral Daily   Continuous Infusions: . sodium chloride    . magnesium sulfate 1 - 4 g bolus IVPB      LOS: 23 days   Kerney Elbe, DO [facility] PAGER is on [place]  If 7PM-7AM, please contact night-coverage [email or web address] Password [facility] 07/14/2018, 11:19 AM

## 2018-07-14 NOTE — Progress Notes (Signed)
CRITICAL VALUE STICKER  CRITICAL VALUE: Calcium 6.1  RECEIVER (on-site recipient of call):  Traci Sermon, RN  DATE & TIME NOTIFIED:   07/14/18 @ 0513  MESSENGER (representative from lab):   [facility]  MD NOTIFIED:  [place]  TIME OF NOTIFICATION: 3748  RESPONSE:  Awaiting orders

## 2018-07-15 ENCOUNTER — Ambulatory Visit
Admit: 2018-07-15 | Discharge: 2018-07-15 | Disposition: A | Discharge status: Home / Self Care | Payer: Medicare Other | Attending: Radiation Oncology | Admitting: Radiation Oncology

## 2018-07-15 DIAGNOSIS — R945 Abnormal results of liver function studies: Secondary | ICD-10-CM

## 2018-07-15 LAB — CBC WITH DIFFERENTIAL/PLATELET
Abs Immature Granulocytes: 0.04 10*3/uL (ref 0.00–0.07)
Basophils Absolute: 0 10*3/uL (ref 0.0–0.1)
Basophils Relative: 0 %
Eosinophils Absolute: 0 10*3/uL (ref 0.0–0.5)
Eosinophils Relative: 0 %
HCT: 33.5 % — ABNORMAL LOW (ref 39.0–52.0)
Hemoglobin: 10.7 g/dL — ABNORMAL LOW (ref 13.0–17.0)
Immature Granulocytes: 1 %
Lymphocytes Relative: 14 %
Lymphs Abs: 0.9 10*3/uL (ref 0.7–4.0)
MCH: 31.9 pg (ref 26.0–34.0)
MCHC: 31.9 g/dL (ref 30.0–36.0)
MCV: 100 fL (ref 80.0–100.0)
Monocytes Absolute: 0.2 10*3/uL (ref 0.1–1.0)
Monocytes Relative: 3 %
Neutro Abs: 5.1 10*3/uL (ref 1.7–7.7)
Neutrophils Relative %: 82 %
Platelets: 125 10*3/uL — ABNORMAL LOW (ref 150–400)
RBC: 3.35 MIL/uL — ABNORMAL LOW (ref 4.22–5.81)
RDW: 15.9 % — ABNORMAL HIGH (ref 11.5–15.5)
WBC: 6.2 10*3/uL (ref 4.0–10.5)
nRBC: 0 % (ref 0.0–0.2)

## 2018-07-15 LAB — COMPREHENSIVE METABOLIC PANEL
ALT: 87 U/L — ABNORMAL HIGH (ref 0–44)
AST: 49 U/L — ABNORMAL HIGH (ref 15–41)
Albumin: 2.4 g/dL — ABNORMAL LOW (ref 3.5–5.0)
Alkaline Phosphatase: 461 U/L — ABNORMAL HIGH (ref 38–126)
Anion gap: 8 (ref 5–15)
BUN: 27 mg/dL — ABNORMAL HIGH (ref 8–23)
CO2: 23 mmol/L (ref 22–32)
Calcium: 6.3 mg/dL — CL (ref 8.9–10.3)
Chloride: 106 mmol/L (ref 98–111)
Creatinine, Ser: 0.91 mg/dL (ref 0.61–1.24)
GFR calc Af Amer: >60 mL/min (ref >60)
GFR calc non Af Amer: >60 mL/min (ref >60)
Glucose, Bld: 103 mg/dL — ABNORMAL HIGH (ref 70–99)
Potassium: 3.7 mmol/L (ref 3.5–5.1)
Sodium: 137 mmol/L (ref 135–145)
Total Bilirubin: 0.8 mg/dL (ref 0.3–1.2)
Total Protein: 5 g/dL — ABNORMAL LOW (ref 6.5–8.1)

## 2018-07-15 LAB — GLUCOSE, CAPILLARY
Glucose-Capillary: 113 mg/dL — ABNORMAL HIGH (ref 70–99)
Glucose-Capillary: 124 mg/dL — ABNORMAL HIGH (ref 70–99)
Glucose-Capillary: 162 mg/dL — ABNORMAL HIGH (ref 70–99)
Glucose-Capillary: 111 mg/dL — ABNORMAL HIGH (ref 70–99)

## 2018-07-15 LAB — PHOSPHORUS: Phosphorus: 3 mg/dL (ref 2.5–4.6)

## 2018-07-15 LAB — LACTATE DEHYDROGENASE: LDH: 216 U/L — ABNORMAL HIGH (ref 98–192)

## 2018-07-15 LAB — MAGNESIUM: Magnesium: 1.7 mg/dL (ref 1.7–2.4)

## 2018-07-15 MED ORDER — HYDROMORPHONE HCL 1 MG/ML IJ SOLN
0.5000 mg | INTRAMUSCULAR | Status: DC | PRN
  Administered 2018-07-16: 17:00:00 0.5 mg via INTRAVENOUS
  Filled 2018-07-15 (×2): qty 0.5

## 2018-07-15 MED ORDER — CALCIUM GLUCONATE-NACL 1-0.675 GM/50ML-% IV SOLN
1.0000 g | Freq: Once | INTRAVENOUS | Status: AC
  Administered 2018-07-15: 08:00:00 1000 mg via INTRAVENOUS
  Filled 2018-07-15: qty 50

## 2018-07-15 MED ORDER — HYDROMORPHONE HCL 2 MG PO TABS
8.0000 mg | ORAL_TABLET | ORAL | Status: DC | PRN
  Administered 2018-07-15 – 2018-07-19 (×16): 8 mg via ORAL
  Filled 2018-07-15 (×17): qty 4

## 2018-07-15 MED ORDER — OLANZAPINE 2.5 MG PO TABS
2.5000 mg | ORAL_TABLET | Freq: Every day | ORAL | Status: DC
  Administered 2018-07-15 – 2018-07-18 (×4): 2.5 mg via ORAL
  Filled 2018-07-15 (×5): qty 1

## 2018-07-15 MED ORDER — LOPERAMIDE HCL 2 MG PO CAPS
2.0000 mg | ORAL_CAPSULE | ORAL | Status: DC | PRN
  Administered 2018-07-15 – 2018-07-18 (×4): 2 mg via ORAL
  Filled 2018-07-15 (×5): qty 1

## 2018-07-15 NOTE — Addendum Note (Signed)
Encounter addended by: Jorene Minors, RN on: 07/15/2018 10:38 PM  Actions taken: MAR administration accepted

## 2018-07-15 NOTE — Progress Notes (Signed)
Occupational Therapy Treatment Patient Details Name: Lucas Morales MRN: [id number] DOB: 05/17/1933 Today's Date: 07/15/2018    History of present illness This is an 85 year old male with a past medical history including hypertension, depression and anxiety, coronary artery disease, chronic interstitial cystitis, previous left hip fracture, previous cervical fusion, hyperlipidemia, restless leg syndrome, diabetes, arthritis, alpha 1 antitrypsin deficiency carrier, anemia, chronic back pain who was admitted for suspected postoperative lumbar decompression at L3-4 infection.  Surgery date was 05/10/2018. MRI suspicious for osteomyelitis. CT scan revealed R medial acetabular fx as well as lesions on liver suspicious for cancer.    OT comments  Radiation came to get pt while OT in room Pt complaining of nausea but did agree to BUE exercise   Follow Up Recommendations  SNF;Supervision/Assistance - 24 hour    Equipment Recommendations  3 in 1 bedside commode    Recommendations for Other Services      Precautions / Restrictions Restrictions Weight Bearing Restrictions: No RLE Weight Bearing: Weight bearing as tolerated              ADL either performed or assessed with clinical judgement   ADL Overall ADL's : Needs assistance/impaired     Grooming: Set up;Bed level Grooming Details (indicate cue type and reason): HOB raised                                     Vision Baseline Vision/History: Wears glasses            Cognition Arousal/Alertness: Awake/alert Behavior During Therapy: WFL for tasks assessed/performed Overall Cognitive Status: Within Functional Limits for tasks assessed                                          Exercises General Exercises - Upper Extremity Shoulder Flexion: 10 reps;Theraband;Both Theraband Level (Shoulder Flexion): Other (comment)(orange) Elbow Flexion: AROM;10 reps Elbow Extension: AROM;10 reps    Shoulder Instructions            Pertinent Vitals/ Pain       Pain Score: 4  Pain Location: general     Prior Functioning/Environment              Frequency  Min 3X/week        Progress Toward Goals  OT Goals(current goals can now be found in the care plan section)  Progress towards OT goals: Progressing toward goals     Plan Discharge plan remains appropriate       AM-PAC OT "6 Clicks" Daily Activity     Outcome Measure   Help from another person eating meals?: None Help from another person taking care of personal grooming?: A Little Help from another person toileting, which includes using toliet, bedpan, or urinal?: A Lot Help from another person bathing (including washing, rinsing, drying)?: A Lot Help from another person to put on and taking off regular upper body clothing?: A Little Help from another person to put on and taking off regular lower body clothing?: A Lot 6 Click Score: 16    End of Session    OT Visit Diagnosis: Unsteadiness on feet (R26.81);Other abnormalities of gait and mobility (R26.89);Muscle weakness (generalized) (M62.81)   Activity Tolerance Patient limited by fatigue   Patient Left in bed   Nurse Communication  Time: 1610-9604 OT Time Calculation (min): 8 min  Charges: OT General Charges $OT Visit: 1 Visit OT Treatments $Therapeutic Activity: 8-22 mins  Lise Auer, OT Acute Rehabilitation Services [phone] Office- [phone]      Fermina Mishkin, Karin Golden D 07/15/2018, 1:00 PM

## 2018-07-15 NOTE — Addendum Note (Signed)
Encounter addended by: Jorene Minors, RN on: 07/15/2018 10:17 PM  Actions taken: MAR administration accepted

## 2018-07-15 NOTE — Progress Notes (Signed)
Patient refusing to reposition off his back. Went down for radiation treatment. Appetite is poor.

## 2018-07-15 NOTE — Progress Notes (Signed)
PT Cancellation Note  Patient Details Name: Lucas Morales MRN: [id number] DOB: 1933/07/28   Cancelled Treatment:     per RN pt unable to tolerate.  Will consult LPT pt's case/participation level.     Rica Koyanagi  PTA Acute  Rehabilitation Services Pager      [phone] Office      [phone]

## 2018-07-15 NOTE — Progress Notes (Signed)
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  I suspect this probably is from the radiation that he is getting.  He has a lot of scrotal swelling still.  Again think this only will get better if he has this pelvic mass shrink.  I am still awaiting the results from the molecular array study to see if this can tell us what the likely primary site of cancer is.  He says that he is eating a little bit better.  There is no nausea or vomiting.  His labs look about the same.  White cell count 6.2.  Hemoglobin 10.7.  Platelet count 125,000.  His BUN is 27 and creatinine 0.91.  His calcium is 6.3.  Albumin is 2.4.  He is on Eliquis.  He is doing well with Eliquis.  He is not bleeding.  His vital signs look pretty stable.  Temperature 97.9.  Pulse 86.  Blood pressure 129/76.  Overall, there is no change in his physical exam.  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  He is getting radiation for this right now.  Hopefully, the molecular array studies will be out this week.  I am sure that physical therapy is still working hard with him to give him some mobility.  I know that the staff on [address] is doing a great job with him.  Lattie Haw, MD  Oswaldo Milian [phone]

## 2018-07-15 NOTE — Progress Notes (Signed)
PROGRESS NOTE    Lucas Morales  NWG:[id number] DOB: 09/12/1933 DOA: 06/21/2018 PCP: Deland Pretty, MD   Brief Narrative: Lucas Morales is a 85 y.o. malewith medical history significant of adenomatous polyps, alpha-1 antitrypsin deficiency carrier, anemia, anxiety, depression, osteoarthritis of the knees and back, easy bruising, chronic bronchitis, chronic lower back pain, CAD, history of frequent diarrhea due to partial colectomy, diverticulosis, episodes of diverticulitis, hemorrhoids, gastritis, hiatal hernia, GERD, esophageal stricture, esophageal dysmotility, history of post CABG paroxysmal A. fib, history of bradycardia, hyperlipidemia, hypertension, OSA not on CPAP due to intolerance, restless leg syndrome, sciatic nerve pain, type 2 diabetes, urinary frequency whohad lumbar decompression L3-4 on 05/10/2018 and was admitted about a week ago due to suspected L3 vertebral body osteomyelitis. However, after reviewing all imaging and getting new imaging studies, it was determined that the patient had a metastatic lesion. 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 He was seen by oncology 3 days ago. He underwent biopsy of the area with interventional radiology. We are seeinghimconsultdue to AKI, worsening LFTs and multiple medical issues.   Developed AKI: Suspect multifactorial secondary to contrast and dehydration. Patient was transferred from [facility] to [place] [facility] on 4/14 to start radiation treatment to pelvis mass.  **Interim History He started radiation treatments and is on day 7 of 10 of Treatment.  Continues to have significant scrotal swelling and feels uncomfortable ambulating with it. Had severe electrolyte abnormalities that are currently being replete. Molecular Genetic Studies done and had nothing that is targetable but is undergoing mollecular array analysis that may delineate origination of tumor.  He is wanting a second opinion and Dr. Marin Olp is to arrange this  once his radiation treatments are finished.  He is to have some pain and palliative care is adjusting his medications still.  Urology was consulted given his penile swelling and scrotal swelling and pain and recommending scheduled BNO suppositories and discontinuing Anticholinergic Therapy.  Had originally refused a BNO suppository but now willing to take it scheduled.  Remains volume overloaded and anasarca so have started IV diuresis twice daily on 07/14/2018 and will continue today and re-evaluate in AM.   Assessment & Plan:   Principal Problem:   Pelvic mass Active Problems:   Essential hypertension   Dyslipidemia   PAF (paroxysmal atrial fibrillation) (HCC)   OSA (obstructive sleep apnea)   Deep venous thrombosis (HCC)   Cancer associated pain   DNR (do not resuscitate)   Type II diabetes mellitus ([place])   Coronary artery disease   AKI (acute kidney injury) ([place])   Metastatic cancer (HCC)   Lung nodules   Bladder spasm  Pelvic mass with Metastasis  Multiple liver masses Multiple lung nodules -Patient initially admitted following lumbar decompression L3-4 on 05/10/2018 with suspected L3 osteomyelitis.   -CT pelvis notable for a 13 x 10 x 10 cm mass arising from the right ischium extending into the posterior aspect of the right acetabulum and adjacent soft tissues. 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 -Further molecular studies per oncology which came back and per Oncology unfortunately they have nothing that they can target and feel like he will need Chemotherapy; Molecular Array Analysis is still pending  -Differential for primary lesion include lung primary vs pelvic sarcoma vs cholangiocarcinoma and Medical  Oncology leaning  towards Cholangiocarcinoma -MR brain negative for metastatic process -Oncology believes this is an extremely aggressive tumor with prognosis 2-3 months -continue XRT; completed #7/10 fractions; Last day planned 07/17/2018 -Continued treatment dose Lovenox for hypercoagulable state given malignancy but changed to Apixaban given thrombocytopenia and he is tolerating Apixaban now -Family wanting a second opinion and Dr. Marin Olp to arrange once Radiation Treatments are finished  -PT/OT to evaluate and Treat. OT now recommending SNF  Pain of Malignancy -Pain is Somewhat uncontrolled and pain regimen will need to be adjusted -Continue Pain Control with Fentanyl Patch but this was increased from 25 mcg to 50 mcg -C/w Dilaudid 4 mg q4hprn Moderate Pain and Decadron 4 mg p.o. daily -C/w Acetaminophen 650 mg po q6hprn Moderate Pain -C/w Dilaudid IV 1 mg q1hprn for breakthrough pain; Palliative decreasing dose to 0.5 mg q1hprn -C/w Palliative care assisting with pain management and Dr. Marin Olp adjusting medications; Palliative to reassess aagain -C/w BNO Suppositories   Acute Renal Failure, improved and resolved resolved -Creatinine elevated to a high of 1.94 during hospitalization. -Etiology likely secondary to decreased oral intake and contrast exposure from CT abdomen/pelvis as well as urinary retention. -Renal ultrasound unrevealing. -Creatinine improving 1.94-->1.33-->1.16-->1.04-->0.95 and is now 0.91 (0.96 on admission) -Avoid nephrotoxins, renally dose all medications -Continue Foley catheter until edema improves; then will attempt voiding trial -Discontinued IVF now and now started Diuresis with IV Lasix  -Continue to monitor renal function daily  Acute Urinary Retention and Massive Penile and Scrotal Swelling Bladder Spasms -Etiology likely secondary to edema and pelvic mass with compression. -Continue Tamsulosin 0.4 mg p.o. daily -Continue Foley catheter until swelling improves then  will attempt voiding trial -C/w Lidocaine, Oxybutynin now discontinued, Opioids, and Belladonna Suppositories -Urology Consulted appreciate further evaluation and recommendations; Dr. Alinda Money evaluated patient and agrees with alpha blocker therapy and is hesitant to having him proceed with a voiding trial right now because catheterization will be difficult if he cannot void there is edema and he believes a CIC is also not a great option right now but he hopes to proceed with a voiding trial with improvement of his penoscrotal edema and will consider early this week but cannot consider catheter removal until edema improves -Dr. Alinda Money also recommending scrotal elevation with a rolled up towel in his scrotum to help reduce edema -Dr. Alinda Money recommending stopping anticholinergic therapy with oxybutynin and recommends administering BNO suppositories around-the-clock so have changed it to q8h.  Also states is okay to use Neosporin/Vaseline or lidocaine jelly around the tip of the penis -Started IV diuresis and will continue for now and stop after tonight's dose and re-evaluate in AM  -Continue to Ambulate and work with PT/OT  L3 lesion -Likely metastatic disease and not consistent with infection, -Infectious disease initially consulted during hospitalization, antibiotics have subsequently discontinued.  Hypertension -C/w Metoprolol succinate 50 mg p.o. daily -BP now 155/85  HLD -Continue holding statin due to abnormal LFTs.  Paroxysmal A. Fib -Continue Metoprolol Succinate 50 mg p.o. daily -Was on Anticoagulation with Full Dose Lovenox and will change to po Apixaban  OSA -Not on CPAP. Continue with oxygen supplementation prn  Acute DVT, Right Soleal Vein  -US Duplex doppler with findings consistent with acute deep vein thrombosis involving the right soleal vein. -Changed to Eliquis from Full dose Lovenox  Type 2 Diabetes  -C/w Lantus 10 units [place] qHS -C/w Moderate Novolog SSI AC   -Continue to Monitor Glucose closely while on Decadron -CBG's ranging from 102-243  Coronary  Artery Disease -Continue beta-blocker with Metoprolol Succinate 50 mg po Daily   Macrocytic Anemia -Patient's Hb/Hct stable at 10.7/33.5 -Continue to Monitor for S/Sx of Bleeding  -Checked Anemia Panel and showed iron level 61, U IBC of 211, TIBC of 272, saturation of 22%, ferritin level 269, folate level 11.9, vitamin B12 level of 1473 -Repeat CBC in AM   Abnormal LFT's/Transaminitis, trending down  -Suspect related to liver masses -AST is now 49 (54) and ALT is now 87 (94) -Continue to Monitor and Trend Hepatic Function -Repeat CMP in AM   Hyponatremia -Improved with IV fluids and is now 137 -Discontinued IV Maintenance Fluids at 45 mL/hr -Continue to Monitor and Trend Na+ and repeat CMP in AM.  Hypokalemia, improved  -Patient's K+ was 3.7 -Continue to monitor and replete as necessary -Repeat CMP in the a.m.  Hypocalcemia -Patient's Ca2+ was 6.3 -Replete with IV Calcium Gluconate 1 gram x1 today -Corrected Ca2+ for Albumin was 7.6 -Continue to Monitor and Replete as Necessary  -Repeat Ca2+ in AM   Hypomagnesemia -Patient magnesium level this morning was 1.7 -Continue monitor and replete as necessary -Repeat magnesium level in the a.m.  Hypophosphatemia -Patient's phosphorus levels morning was 3.0 -Continue monitor replete as necessary -Repeat phosphorus level in the a.m.  Metabolic Acidosis, improved  -Continue Sodium Bicarbonate 1300mg  PO BID -CO2 now 23 and AG is 8  Thrombocytopenia  -Platelet Count had been dropping and is now fluctuating  -Had been on Full Dose Lovenox -Concern for HIT so will send Ab; Pharmacy Notified and switched Anticoagulation to Eliquis -Platelet Count now 125,000;  -Continue to Monitor for S/Sx of Bleeding -Repeat CBC in AM  Lower Extremity Swelling/Anasaraca -In the setting of his cancer and poor albumin status -Patient was  +16.012 Liters since Admission and now improved to +[phone] -We will consult nutrition for further evaluation recommendations -Continue with feeding supplements of Ensure Enlive 237 mils p.o. twice daily as well as Protostat 30 mL's p.o. twice daily -Given one-time dose of IV Lasix 40 mg the day before yestrerday and will scheduled Lasix 40 mg IV BID yesterday and will continue today and re-assess in AM  -Continue to monitor  Obesity -Estimated body mass index is 33.29 kg/m as calculated from the following:   Height as of this encounter: 6\' 2"  (1.88 m).   Weight as of this encounter: 117.6 kg. -Nutritionist on board as patient is having poor p.o. intake and was started on Dronabinol 2.5 po BID -Nutritionist following and recommending continue Ensure Enlive p.o. twice daily, pro-stat 30 mL's p.o. twice daily, multivitamin with mineral daily, as well as Magic cup 3 times daily  Diarrhea -Patient's Now complaining of large bowel movements -Suspect it could be from Radiation but has been on several Stool Softeners -Continue to Monitor and will discontinue Senna at bedtime along with Docusate 100 mg po BID -C/w Bisacodyl 10 mg RC Dailyprn for now -Continue to Montior; Currently patient is Afebrile and has no Leukocytosis    DVT prophylaxis: Anticoagulated with Apixaban   Code Status: DO NOT RESUSCITATE Family Communication: No family at bedside  Disposition Plan: Continue Radiation Treatment and PT recommends SNF versus home health; will likely remain inpatient for radiation treatments with planned completion on 07/17/2018  Consultants:   Palliative Care Medicine   Medical Oncology   Radiation Oncology  Orthopedic Surgery  Interventional Radiology  Urology   Procedures:  CT guided biopsy lytic right pelvic bone lesion on 06/27/2018 by IR  Antimicrobials:  Anti-infectives (From  admission, onward)   Start     Dose/Rate Route Frequency Ordered Stop   06/27/18 1000  fluconazole  (DIFLUCAN) tablet 100 mg  Status:  Discontinued     100 mg Oral Daily 06/27/18 0625 07/05/18 0944   06/24/18 0700  vancomycin (VANCOCIN) 1,250 mg in sodium chloride 0.9 % 250 mL IVPB  Status:  Discontinued     1,250 mg 166.7 mL/hr over 90 Minutes Intravenous Every 12 hours 06/23/18 1916 06/25/18 0815   06/22/18 1300  cefTRIAXone (ROCEPHIN) 2 g in sodium chloride 0.9 % 100 mL IVPB  Status:  Discontinued     2 g 200 mL/hr over 30 Minutes Intravenous Every 24 hours 06/22/18 1217 06/25/18 0758   06/22/18 0700  vancomycin (VANCOCIN) 1,000 mg in sodium chloride 0.9 % 250 mL IVPB  Status:  Discontinued     1,000 mg 250 mL/hr over 60 Minutes Intravenous Every 12 hours 06/21/18 1813 06/23/18 1916   06/22/18 0300  piperacillin-tazobactam (ZOSYN) IVPB 3.375 g  Status:  Discontinued     3.375 g 12.5 mL/hr over 240 Minutes Intravenous Every 8 hours 06/21/18 1813 06/22/18 1217   06/21/18 1700  piperacillin-tazobactam (ZOSYN) IVPB 3.375 g     3.375 g 100 mL/hr over 30 Minutes Intravenous  Once 06/21/18 1650 06/21/18 1847   06/21/18 1700  vancomycin (VANCOCIN) 2,000 mg in sodium chloride 0.9 % 500 mL IVPB     2,000 mg 250 mL/hr over 120 Minutes Intravenous  Once 06/21/18 1658 06/21/18 2200     Subjective: Seen and examined at bedside sitting in the chair still complaining of some swelling.  States his main concern was now having several bowel movements.  No lightheadedness or dizziness.  Still complaining of some pain.  No other concerns or complaints at this time  Objective: Vitals:   07/14/18 0529 07/14/18 2017 07/15/18 0512 07/15/18 1009  BP: 138/78 131/71 129/76 (!) 155/85  Pulse: 89 95 86 87  Resp: 18 18 18    Temp: 98.2 F (36.8 C) 98.1 F (36.7 C) 97.9 F (36.6 C)   TempSrc: Oral Oral Oral   SpO2: 97% 99% 96%   Weight:      Height:        Intake/Output Summary (Last 24 hours) at 07/15/2018 1327 Last data filed at 07/15/2018 0900 Gross per 24 hour  Intake 480 ml  Output 4650 ml  Net  -4170 ml   Filed Weights   07/05/18 1133 07/08/18 1100 07/09/18 0800  Weight: 112 kg 119.6 kg 117.6 kg   Examination: Physical Exam:  Constitutional: Well-nourished, well-developed obese elderly Caucasian male who appears somewhat uncomfortable sitting in chair bedside but is in no real acute distress Eyes: Lids and conjunctive are normal.  Sclera anicteric ENMT: External ears nose appear normal.  Mucous membranes appear moist Neck: Appears supple no JVD Respiratory: Diminished auscultation bilaterally no appreciable wheezing, rales, rhonchi.  Patient is not tachypneic or using any accessory muscles to breathe Cardiovascular: Irregularly irregular.  Has 2+ lower extremity edema Abdomen: Soft, nontender, distended secondary body habitus.  Bowel sounds present GU: He is very large penoscrotal swelling and indwelling Foley catheter in place Musculoskeletal: No contractures or cyanosis.  No joint deformities upper extremities but remains severely volume overloaded and anasarcic Skin: Skin is warm and dry no appreciable rashes or lesions limited skin evaluation Neurologic: Cranial nerves II through XII grossly intact no appreciable focal deficits. Psychiatric: Appeared Depressed and has a flat affect.  He is awake, alert, and oriented.  Data  Reviewed: I have personally reviewed following labs and imaging studies  CBC: Recent Labs  Lab 07/11/18 0527 07/12/18 0356 07/13/18 0355 07/14/18 0400 07/15/18 0537  WBC 8.8 8.1 7.3 6.6 6.2  NEUTROABS 7.5 7.0 6.1 5.6 5.1  HGB 10.9* 10.4* 10.6* 10.4* 10.7*  HCT 33.9* 32.4* 33.4* 32.8* 33.5*  MCV 102.7* 101.6* 101.8* 100.6* 100.0  PLT 137* 124* 129* 118* 093*   Basic Metabolic Panel: Recent Labs  Lab 07/11/18 0527 07/12/18 0356 07/13/18 0355 07/14/18 0400 07/15/18 0537  NA 137 139 138 137 137  K 3.7 3.8 3.8 3.8 3.7  CL 109 111 109 107 106  CO2 19* 22 21* 22 23  GLUCOSE 102* 95 104* 112* 103*  BUN 28* 24* 23 25* 27*  CREATININE 0.85  0.77 0.78 0.82 0.91  CALCIUM 6.1* 5.9* 6.1* 6.1* 6.3*  MG 1.8 1.5* 1.8 1.5* 1.7  PHOS 2.2* 2.3* 2.8 2.9 3.0   GFR: Estimated Creatinine Clearance: 85.3 mL/min (by C-G formula based on SCr of 0.91 mg/dL). Liver Function Tests: Recent Labs  Lab 07/11/18 0527 07/12/18 0356 07/13/18 0355 07/14/18 0400 07/15/18 0537  AST 63* 59* 56* 54* 49*  ALT 103* 99* 100* 94* 87*  ALKPHOS 427* 445* 463* 464* 461*  BILITOT 1.0 0.8 0.9 0.8 0.8  PROT 5.4* 4.9* 5.2* 5.0* 5.0*  ALBUMIN 2.7* 2.4* 2.7* 2.4* 2.4*   No results for input(s): LIPASE, AMYLASE in the last 168 hours. No results for input(s): AMMONIA in the last 168 hours. Coagulation Profile: No results for input(s): INR, PROTIME in the last 168 hours. Cardiac Enzymes: No results for input(s): CKTOTAL, CKMB, CKMBINDEX, TROPONINI in the last 168 hours. BNP (last 3 results) No results for input(s): PROBNP in the last 8760 hours. HbA1C: No results for input(s): HGBA1C in the last 72 hours. CBG: Recent Labs  Lab 07/14/18 1129 07/14/18 1653 07/14/18 2021 07/15/18 0739 07/15/18 1144  GLUCAP 118* 243* 181* 113* 124*   Lipid Profile: No results for input(s): CHOL, HDL, LDLCALC, TRIG, CHOLHDL, LDLDIRECT in the last 72 hours. Thyroid Function Tests: No results for input(s): TSH, T4TOTAL, FREET4, T3FREE, THYROIDAB in the last 72 hours. Anemia Panel: No results for input(s): VITAMINB12, FOLATE, FERRITIN, TIBC, IRON, RETICCTPCT in the last 72 hours. Sepsis Labs: No results for input(s): PROCALCITON, LATICACIDVEN in the last 168 hours.  No results found for this or any previous visit (from the past 240 hour(s)).   RN Pressure Injury Documentation:    Estimated body mass index is 33.29 kg/m as calculated from the following:   Height as of this encounter: 6\' 2"  (1.88 m).   Weight as of this encounter: 117.6 kg.  Malnutrition Type:  Nutrition Problem: Increased nutrient needs Etiology: post-op healing   Malnutrition  Characteristics:  Signs/Symptoms: estimated needs   Nutrition Interventions:  Interventions: MVI, Prostat, Magic cup, Boost Plus   Radiology Studies: No results found.  Scheduled Meds:  apixaban  10 mg Oral BID   Followed by   Derrill Memo ON 07/17/2018] apixaban  5 mg Oral BID   dexamethasone  4 mg Oral Daily   docusate sodium  100 mg Oral BID   dronabinol  2.5 mg Oral BID AC   feeding supplement (ENSURE ENLIVE)  237 mL Oral BID BM   feeding supplement (PRO-STAT SUGAR FREE 64)  30 mL Oral BID WC   fentaNYL  1 patch Transdermal Q72H   furosemide  40 mg Intravenous BID   insulin aspart  0-15 Units Subcutaneous TID WC   insulin  glargine  10 Units Subcutaneous QHS   lidocaine  1 application Topical Once   metoprolol succinate  50 mg Oral Daily   multivitamin with minerals  1 tablet Oral Daily   opium-belladonna  1 suppository Rectal Q8H   pantoprazole  40 mg Oral BID   senna  1 tablet Oral QHS   sodium bicarbonate  1,300 mg Oral BID   sodium chloride flush  10-40 mL Intracatheter Q12H   sodium chloride flush  3 mL Intravenous Q12H   tamsulosin  0.4 mg Oral Daily   Continuous Infusions:  sodium chloride      LOS: 24 days   Kerney Elbe, DO [facility] PAGER is on AMION  If 7PM-7AM, please contact night-coverage [email or web address] Password [email or web address] 07/15/2018, 1:27 PM

## 2018-07-15 NOTE — Progress Notes (Signed)
Daily Progress Note   Patient Name: Lucas Morales       Date: 9/79/4801 DOB: 29-Jul-1933  Age: 85 y.o. MRN#: [id number] Attending Physician: Kerney Elbe, DO Primary Care Physician: Deland Pretty, MD Admit Date: 06/21/2018  Reason for Consultation/Follow-up: Establishing goals of care and Psychosocial/spiritual support  Subjective: Patient reports he is down. "I'm dying".  He described having terrible bowel movements in the middle of the night.  I attempted to refocus him on discharge and being outside in the fresh air.  His meals have been too heavy (meatloaf) for him to stomach.  I spoke with [name] and [name] to update them.  They are attempting to plan for his discharge.  Assessment: Patient appears down/depressed.  Eating decreased.  Still with diarrhea.  He is requiring increased amounts of pain medications.   Patient Profile/HPI:  85 y.o. male  with past medical history of CAD, Afib, alpha 1 antitrypsin deficiency, interstitial cystitis (with a tendency to have urinary retention), DM, depression and anxiety who was admitted on 06/21/2018 with what was thought to be a post op infection at L3-L4.  Work up has revealed widespread (lungs, liver, pelvis, bone) metastatic cancer of undetermined primary.  Mr. Sherk is going for a biopsy this morning.   PMT has been asked to consult for symptom management.   Length of Stay: 24  Current Medications: Scheduled Meds:  . apixaban  10 mg Oral BID   Followed by  . [START ON 07/17/2018] apixaban  5 mg Oral BID  . dexamethasone  4 mg Oral Daily  . feeding supplement (ENSURE ENLIVE)  237 mL Oral BID BM  . feeding supplement (PRO-STAT SUGAR FREE 64)  30 mL Oral BID WC  . fentaNYL  1 patch Transdermal Q72H  . furosemide  40 mg  Intravenous BID  . insulin aspart  0-15 Units Subcutaneous TID WC  . insulin glargine  10 Units Subcutaneous QHS  . lidocaine  1 application Topical Once  . metoprolol succinate  50 mg Oral Daily  . multivitamin with minerals  1 tablet Oral Daily  . OLANZapine  2.5 mg Oral QHS  . opium-belladonna  1 suppository Rectal Q8H  . pantoprazole  40 mg Oral BID  . sodium bicarbonate  1,300 mg Oral BID  . sodium chloride flush  10-40 mL  Intracatheter Q12H  . sodium chloride flush  3 mL Intravenous Q12H  . tamsulosin  0.4 mg Oral Daily    Continuous Infusions: . sodium chloride      PRN Meds: sodium chloride, acetaminophen, alum & mag hydroxide-simeth, bisacodyl, HYDROmorphone (DILAUDID) injection, HYDROmorphone, loperamide, LORazepam, menthol-cetylpyridinium, neomycin-bacitracin-polymyxin, ondansetron **OR** ondansetron (ZOFRAN) IV, sodium chloride flush, sodium chloride flush  Physical Exam        welll developed A&O slightly tremulous CV rrr resp no distress Abdomen soft.  Vital Signs: BP (!) 155/85   Pulse 87   Temp 97.9 F (36.6 C) (Oral)   Resp 18   Ht 6\' 2"  (1.88 m)   Wt 117.6 kg   SpO2 96%   BMI 33.29 kg/m  SpO2: SpO2: 96 % O2 Device: O2 Device: Room Air O2 Flow Rate: O2 Flow Rate (L/min): 2 L/min  Intake/output summary:   Intake/Output Summary (Last 24 hours) at 07/15/2018 1633 Last data filed at 07/15/2018 1500 Gross per 24 hour  Intake 930 ml  Output 3950 ml  Net -3020 ml   LBM: Last BM Date: 07/15/18 Baseline Weight: Weight: 101.3 kg Most recent weight: Weight: 117.6 kg       Palliative Assessment/Data:  40%      Patient Active Problem List   Diagnosis Date Noted  . Lung nodules 07/03/2018  . Bladder spasm   . Metastatic cancer ([place])   . Pelvic mass   . Type II diabetes mellitus ([place]) 06/27/2018  . Coronary artery disease 06/27/2018  . AKI (acute kidney injury) ([place]) 06/27/2018  . Cancer associated pain   . DNR (do not resuscitate)   .  Palliative care encounter   . Deep venous thrombosis ([place]) 06/26/2018  . 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 Status post lumbar spine surgery for decompression of spinal cord 05/24/2018  . Abnormal EKG 05/25/2017  . S/P cervical spinal fusion 01/25/2016  . Cervical spinal stenosis 12/20/2015  . Cervical spondylosis 12/20/2015  . Painful orthopaedic hardware left hip with chronic trochanteric bursitis 07/30/2015  . Hip bursitis 07/30/2015  . Trochanteric bursitis of left hip 07/30/2015  . Chest pain with moderate risk of acute coronary syndrome 10/19/2014  . Macular hole, left eye 09/01/2014  . Macular hole of left eye 08/28/2014  . Other pancytopenia ([place])   . Sliding hiatal hernia   . Gastric erosions   . GI bleeding 06/04/2014  . Acute GI bleeding 06/04/2014  . Orthostatic hypotension 06/04/2014  . Acute upper gastrointestinal bleeding 06/04/2014  . Other specified diabetes mellitus without complications ([place])   . Acute blood loss anemia   . Thrombocytopenia ([place])   . Closed left hip fracture ([place]) 04/03/2014  . Fracture, femur ([place]) 04/03/2014  . Femur fracture ([place]) 04/03/2014  . Upper airway cough syndrome 05/16/2013  . OSA (obstructive sleep apnea) 10/21/2012  . Macular hole 10/08/2012  . Preoperative clearance 09/30/2012  . PAF (paroxysmal atrial fibrillation) ([place]) 01/21/2012  . Shortness of breath on exertion 01/11/2012  . Bradycardia, sinus 01/11/2012  . Hx of CABG 01/11/2012  . Essential hypertension   . Dyslipidemia   . ABDOMINAL PAIN-LLQ 02/04/2010  . HEMORRHOIDS 08/31/2008  . ESOPHAGEAL STRICTURE 08/31/2008  . GERD 08/31/2008  . Gastritis and gastroduodenitis 08/31/2008  . HIATAL HERNIA 08/31/2008  . DIVERTICULOSIS, COLON 08/31/2008  . Chronic interstitial cystitis 08/31/2008  . COLONIC POLYPS, ADENOMATOUS, HX OF 08/31/2008    Palliative Care Plan    Recommendations/Plan:  Add low dose 2.5 mg olanzepine for appetite and  pain control.   Will  DC marianol.  Add imodium PRN for diarrhea.  Will increase oral dilaudid and decrease IV dilaudid in an attempt to prepare him for discharge.  Goals of Care and Additional Recommendations:  Limitations on Scope of Treatment: Full Scope Treatment  Code Status:  DNR  Prognosis: less than 3 months.  Due to metastatic carcinoma s/p radiation.    Discharge Planning:  To home with 24 hour care.  Care plan was discussed with patient and his two children  Thank you for allowing the Palliative Medicine Team to assist in the care of this patient.  Total time spent:  45 min     Greater than 50%  of this time was spent counseling and coordinating care related to the above assessment and plan.  Florentina Jenny, PA-C Palliative Medicine  Please contact Palliative MedicineTeam phone at [phone] for questions and concerns between 7 am - 7 pm.   Please see AMION for individual provider pager numbers.

## 2018-07-15 NOTE — Progress Notes (Signed)
CRITICAL VALUE ALERT  Critical Value: calcium- 6.3  Date & Time Notied: 07/15/2018 @0620   Provider Notified: [place]  Orders Received/Actions taken: paged MD-- awaiting orders

## 2018-07-16 ENCOUNTER — Encounter (HOSPITAL_COMMUNITY): Payer: Self-pay | Admitting: Hematology & Oncology

## 2018-07-16 ENCOUNTER — Ambulatory Visit: Payer: Medicare Other

## 2018-07-16 ENCOUNTER — Ambulatory Visit
Admit: 2018-07-16 | Discharge: 2018-07-16 | Disposition: A | Discharge status: Home / Self Care | Payer: Medicare Other | Attending: Radiation Oncology | Admitting: Radiation Oncology

## 2018-07-16 DIAGNOSIS — C499 Malignant neoplasm of connective and soft tissue, unspecified: Secondary | ICD-10-CM

## 2018-07-16 LAB — CBC WITH DIFFERENTIAL/PLATELET
Abs Immature Granulocytes: 0.07 10*3/uL (ref 0.00–0.07)
Basophils Absolute: 0 10*3/uL (ref 0.0–0.1)
Basophils Relative: 0 %
Eosinophils Absolute: 0.1 10*3/uL (ref 0.0–0.5)
Eosinophils Relative: 2 %
HCT: 32.5 % — ABNORMAL LOW (ref 39.0–52.0)
Hemoglobin: 10.7 g/dL — ABNORMAL LOW (ref 13.0–17.0)
Immature Granulocytes: 1 %
Lymphocytes Relative: 15 %
Lymphs Abs: 1 10*3/uL (ref 0.7–4.0)
MCH: 33.1 pg (ref 26.0–34.0)
MCHC: 32.9 g/dL (ref 30.0–36.0)
MCV: 100.6 fL — ABNORMAL HIGH (ref 80.0–100.0)
Monocytes Absolute: 0.2 10*3/uL (ref 0.1–1.0)
Monocytes Relative: 3 %
Neutro Abs: 4.9 10*3/uL (ref 1.7–7.7)
Neutrophils Relative %: 79 %
Platelets: 127 10*3/uL — ABNORMAL LOW (ref 150–400)
RBC: 3.23 MIL/uL — ABNORMAL LOW (ref 4.22–5.81)
RDW: 16.2 % — ABNORMAL HIGH (ref 11.5–15.5)
WBC: 6.2 10*3/uL (ref 4.0–10.5)
nRBC: 0 % (ref 0.0–0.2)

## 2018-07-16 LAB — COMPREHENSIVE METABOLIC PANEL
ALT: 86 U/L — ABNORMAL HIGH (ref 0–44)
AST: 50 U/L — ABNORMAL HIGH (ref 15–41)
Albumin: 2.5 g/dL — ABNORMAL LOW (ref 3.5–5.0)
Alkaline Phosphatase: 502 U/L — ABNORMAL HIGH (ref 38–126)
Anion gap: 9 (ref 5–15)
BUN: 28 mg/dL — ABNORMAL HIGH (ref 8–23)
CO2: 25 mmol/L (ref 22–32)
Calcium: 6.5 mg/dL — ABNORMAL LOW (ref 8.9–10.3)
Chloride: 104 mmol/L (ref 98–111)
Creatinine, Ser: 0.95 mg/dL (ref 0.61–1.24)
GFR calc Af Amer: >60 mL/min (ref >60)
GFR calc non Af Amer: >60 mL/min (ref >60)
Glucose, Bld: 112 mg/dL — ABNORMAL HIGH (ref 70–99)
Potassium: 3.6 mmol/L (ref 3.5–5.1)
Sodium: 138 mmol/L (ref 135–145)
Total Bilirubin: 1 mg/dL (ref 0.3–1.2)
Total Protein: 5.2 g/dL — ABNORMAL LOW (ref 6.5–8.1)

## 2018-07-16 LAB — PHOSPHORUS: Phosphorus: 3 mg/dL (ref 2.5–4.6)

## 2018-07-16 LAB — GLUCOSE, CAPILLARY
Glucose-Capillary: 125 mg/dL — ABNORMAL HIGH (ref 70–99)
Glucose-Capillary: 144 mg/dL — ABNORMAL HIGH (ref 70–99)
Glucose-Capillary: 199 mg/dL — ABNORMAL HIGH (ref 70–99)
Glucose-Capillary: 115 mg/dL — ABNORMAL HIGH (ref 70–99)

## 2018-07-16 LAB — LACTATE DEHYDROGENASE: LDH: 211 U/L — ABNORMAL HIGH (ref 98–192)

## 2018-07-16 LAB — MAGNESIUM: Magnesium: 1.6 mg/dL — ABNORMAL LOW (ref 1.7–2.4)

## 2018-07-16 MED ORDER — CALCIUM GLUCONATE-NACL 1-0.675 GM/50ML-% IV SOLN
1.0000 g | Freq: Once | INTRAVENOUS | Status: AC
  Administered 2018-07-16: 11:00:00 1000 mg via INTRAVENOUS
  Filled 2018-07-16: qty 50

## 2018-07-16 MED ORDER — MAGNESIUM SULFATE 2 GM/50ML IV SOLN
2.0000 g | Freq: Once | INTRAVENOUS | Status: AC
  Administered 2018-07-16: 2 g via INTRAVENOUS
  Filled 2018-07-16: qty 50

## 2018-07-16 NOTE — Addendum Note (Signed)
Encounter addended by: Martyn Malay, RN on: 07/16/2018 10:00 AM  Actions taken: MAR administration accepted

## 2018-07-16 NOTE — Addendum Note (Signed)
Encounter addended by: Martyn Malay, RN on: 07/16/2018 8:22 AM  Actions taken: MAR administration accepted

## 2018-07-16 NOTE — Addendum Note (Signed)
Encounter addended by: Martyn Malay, RN on: 07/16/2018 3:14 PM  Actions taken:  [facility] administration accepted, Flowsheet accepted

## 2018-07-16 NOTE — Progress Notes (Signed)
Nutrition Follow-up  INTERVENTION:   -D/c orders for Ensure and Prostat supplements -Supplements available on unit if pt desires one  NUTRITION DIAGNOSIS:   Increased nutrient needs related to post-op healing as evidenced by estimated needs.  Ongoing. Now related to cancer diagnosis as well.  GOAL:   Patient will meet greater than or equal to 90% of their needs  Progressing.  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 On 4/27, pt consumed 50-60% of meals but this morning he only consumed 25% of his breakfast. This is most likely related to receiving his sarcoma diagnosis. Per oncology note, pt wanting to pursue a second opinion at [facility]. Pt is continuing radiation treatments this admission.   Pt has not been drinking supplements. Will d/c and Ensure is available from unit if pt desires.  No weight has been measured since 4/21. Recommend new weight measurement as pt is being diuresed, receiving IV Lasix.  Medications: IV Lasix BID, Multivitamin with minerals daily, IV Mg sulfate Labs reviewed: CBGs: 125-144 Low Mg  Phos WNL  Diet Order:   Diet Order            Diet regular Room service appropriate? Yes; Fluid consistency: Thin  Diet effective now              EDUCATION NEEDS:   Not appropriate for education at this time  Skin:  Skin Assessment: Skin Integrity Issues: Skin  Integrity Issues:: Incisions Incisions: posterior pelvis  Last BM:  4/27  Height:   Ht Readings from Last 1 Encounters:  06/21/18 6\' 2"  (1.88 m)    Weight:   Wt Readings from Last 1 Encounters:  07/09/18 117.6 kg    Ideal Body Weight:  86.3 kg  BMI:  Body mass index is 33.29 kg/m.  Estimated Nutritional Needs:   Kcal:  2000-2300kcal/day   Protein:  100-120g/day   Fluid:  >2.1L/day   Clayton Bibles, MS, RD, LDN [facility] Pager: [phone] After Hours Pager: [phone]

## 2018-07-16 NOTE — Progress Notes (Addendum)
PROGRESS NOTE    NAHMIR Morales  QQI:[id number] DOB: [birth year] DOA: 06/21/2018 PCP: Deland Pretty, MD   Brief Narrative: Lucas Morales is a 85 y.o. malewith medical history significant of adenomatous polyps, alpha-1 antitrypsin deficiency carrier, anemia, anxiety, depression, osteoarthritis of the knees and back, easy bruising, chronic bronchitis, chronic lower back pain, CAD, history of frequent diarrhea due to partial colectomy, diverticulosis, episodes of diverticulitis, hemorrhoids, gastritis, hiatal hernia, GERD, esophageal stricture, esophageal dysmotility, history of post CABG paroxysmal A. fib, history of bradycardia, hyperlipidemia, hypertension, OSA not on CPAP due to intolerance, restless leg syndrome, sciatic nerve pain, type 2 diabetes, urinary frequency whohad lumbar decompression L3-4 on 05/10/2018 and was admitted about a week ago due to suspected L3 vertebral body osteomyelitis. However, after reviewing all imaging and getting new imaging studies, it was determined that the patient had a metastatic lesion. 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 He was seen by oncology 3 days ago. He underwent biopsy of the area with interventional radiology. We are seeinghimconsultdue to AKI, worsening LFTs and multiple medical issues.   Developed AKI: Suspect multifactorial secondary to contrast and dehydration. Patient was transferred from [facility]/14 to start radiation treatment to pelvis mass.  **Interim History He started radiation treatments and is on day 8 of 10 of Treatment.  Continues to have significant scrotal swelling and feels uncomfortable ambulating with it. Had severe electrolyte abnormalities that are currently being replete (Mag and Ca2+). Molecular Genetic Studies done and had nothing that is targetable but is undergoing mollecular array analysis that may delineate origination of tumor. 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 He is wanting a second opinion and Dr. Marin Olp is to arrange this once his radiation treatments are finished.  He is to have some pain and palliative care is adjusting his medications still.  Urology was consulted given his penile swelling and scrotal swelling and pain and recommending scheduled BNO suppositories and discontinuing Anticholinergic Therapy.  Had originally refused a BNO suppository but now willing to take it scheduled.  Remains volume overloaded and has anasarca so have started IV diuresis twice daily on 07/14/2018 and will continue today and re-evaluate in AM.   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   -CT pelvis notable for a 13 x 10 x 10 cm mass arising from the right ischium extending into the posterior aspect of the right acetabulum and adjacent soft tissues. 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 Marin Olp to arrange once Radiation Treatments are finished at [facility] with Sarcoma Specialist  -Per Dr. Marin Olp this is "The worst kind of cancer that he could have" and Dr. Marin Olp doubts the patient has 2-3 months as prognosis is grim. -Palliative on board and discussing [place] and patient wanting to "fight" but Pallative discussing with daughter about short prognosis and likely will engage Hospice Services after obtaining second opinion  -PT/OT to evaluate and Treat. OT now recommending SNF bujt will need re-evaluation as patient wanting to go home.   Pain of Malignancy -Pain is Somewhat uncontrolled and pain regimen will need to be adjusted -Continue Pain Control with Fentanyl Patch but this was increased from 25 mcg to 50 mcg -C/w Dilaudid 4 mg q4hprn Moderate Pain and Decadron 4 mg p.o. daily -C/w Acetaminophen 650 mg po q6hprn Moderate Pain -C/w Dilaudid IV 1 mg q1hprn for breakthrough pain; Palliative decreasing dose to 0.5 mg q1hprn -C/w Palliative care assisting with pain management and Dr. Marin Olp adjusting medications; Palliative to reassess again -C/w BNO Suppositories   Acute Renal Failure, improved and resolved resolved -Creatinine elevated to a high of 1.94 during hospitalization. -Etiology likely secondary to  decreased oral intake and contrast exposure from CT abdomen/pelvis as well as urinary retention. -Renal ultrasound unrevealing. -Creatinine improving 1.94-->1.33-->1.16-->1.04-->0.95 and is now 0.95 (0.96 on admission) -Avoid nephrotoxins, renally dose all medications -Continue Foley catheter until edema improves; then will attempt voiding trial -Discontinued IVF now and now started Diuresis with IV Lasix  -Continue to monitor renal function daily  Acute Urinary Retention and Massive Penile and Scrotal Swelling Bladder Spasms -Etiology likely secondary to edema and pelvic mass with compression. -Continue Tamsulosin 0.4 mg p.o. daily -Continue Foley catheter until swelling improves then will attempt voiding trial -C/w Lidocaine, Oxybutynin now discontinued, Opioids, and Belladonna Suppositories -Urology Consulted appreciate further evaluation and recommendations; Dr. Alinda Money evaluated patient and agrees with alpha blocker therapy and is hesitant to having him proceed with a voiding trial right now because catheterization will be difficult if he cannot void there is edema and he believes a CIC is also not a great option right now but he hopes to proceed with a voiding trial with improvement of his penoscrotal edema and will consider early this week but cannot consider catheter removal until edema improves -Dr. Alinda Money also recommending scrotal elevation with a rolled up towel in his scrotum to help reduce edema -Dr. Alinda Money recommending stopping anticholinergic therapy with oxybutynin and recommends administering BNO suppositories around-the-clock so have changed it to q8h.  Also states is okay to use Neosporin/Vaseline or lidocaine jelly around the tip of the penis -Started IV diuresis and will continue for now -Patient not wanting to consider a Suprapubic Catheter  -Continue to Ambulate and work with PT/OT  L3 lesion -Likely metastatic disease and not consistent with infection, -Infectious  disease initially consulted during hospitalization, antibiotics have subsequently discontinued.  Hypertension -  C/w Metoprolol succinate 50 mg p.o. daily -BP now 126/76  HLD -Continue holding statin due to abnormal LFTs.  Paroxysmal A. Fib -Continue Metoprolol Succinate 50 mg p.o. daily -Was on Anticoagulation with Full Dose Lovenox and will change to po Apixaban  OSA -Not on CPAP. Continue with oxygen supplementation prn  Acute DVT, Right Soleal Vein  -US Duplex doppler with findings consistent with acute deep vein thrombosis involving the right soleal vein. -Changed to Eliquis from Full dose Lovenox  Type 2 Diabetes  -C/w Lantus 10 units [place] qHS -C/w Moderate Novolog SSI AC  -Continue to Monitor Glucose closely while on Decadron -CBG's ranging from 111-162  Coronary Artery Disease -Continue beta-blocker with Metoprolol Succinate 50 mg po Daily   Macrocytic Anemia -Patient's Hb/Hct stable at 10.7/32.5 -Continue to Monitor for S/Sx of Bleeding  -Checked Anemia Panel and showed iron level 61, U IBC of 211, TIBC of 272, saturation of 22%, ferritin level 269, folate level 11.9, vitamin B12 level of 1473 -Repeat CBC in AM   Abnormal LFT's/Transaminitis, trending down  -Suspect related to liver masses -AST is now 50 (59) and ALT is now 86(87) -Continue to Monitor and Trend Hepatic Function -Repeat CMP in AM   Hyponatremia -Improved with IV fluids and is now 138 -Discontinued IV Maintenance Fluids at 45 mL/hr and now on IV Diuresis  -Continue to Monitor and Trend Na+ and repeat CMP in AM.  Hypokalemia, improved  -Patient's K+ was 3.6 -Continue to monitor and replete as necessary -Repeat CMP in the a.m.  Hypocalcemia -Patient's Ca2+ was 6.5 -Replete with IV Calcium Gluconate 1 gram x1 today -Corrected Ca2+ for Albumin was 7.7 -Continue to Monitor and Replete as Necessary  -Repeat Ca2+ in AM   Hypomagnesemia -Patient magnesium level this morning was 1.6  -Replete with IV Mag Sulfate 2 Grams -Continue monitor and replete as necessary -Repeat magnesium level in the a.m.  Hypophosphatemia -Patient's phosphorus levels morning was 3.0 -Continue monitor replete as necessary -Repeat phosphorus level in the a.m.  Metabolic Acidosis, improved  -Continue Sodium Bicarbonate 1300mg  PO BID -CO2 now 25 and AG is 9  Thrombocytopenia  -Platelet Count had been dropping and is now fluctuating  -Had been on Full Dose Lovenox -Concern for HIT so will send Ab; Pharmacy Notified and switched Anticoagulation to Eliquis -Platelet Count now 127,000;  -Continue to Monitor for S/Sx of Bleeding -Repeat CBC in AM  Lower Extremity Swelling/Anasaraca -In the setting of his cancer and poor albumin status -Patient was +16.012 Liters since Admission and now improved to +10.055.3 -We will consult nutrition for further evaluation recommendations -Continue with feeding supplements of Ensure Enlive 237 mils p.o. twice daily as well as Protostat 30 mL's p.o. twice daily -Started Diuresis with IV Lasix and scheduled it 40 mg IV BID and will continue for now and re-assess in AM  -Continue to monitor  Obesity -Estimated body mass index is 33.29 kg/m as calculated from the following:   Height as of this encounter: 6\' 2"  (1.88 m).   Weight as of this encounter: 117.6 kg. -Nutritionist on board as patient is having poor p.o. intake and was started on Dronabinol 2.5 po BID -Nutritionist following and recommending continue Ensure Enlive p.o. twice daily, pro-stat 30 mL's p.o. twice daily, multivitamin with mineral daily, as well as Magic cup 3 times daily  Diarrhea, improved -Patient's Now complaining of large bowel movements but they were improved with imodium  -Suspect it could be from Radiation but has been on several Stool  Softeners -Continue to Monitor and will discontinue Senna at bedtime along with Docusate 100 mg po BID -C/w Bisacodyl 10 mg RC Dailyprn for now  -Continue to [place]; Currently patient is Afebrile and has no Leukocytosis   DVT prophylaxis: Anticoagulated with Apixaban   Code Status: DO NOT RESUSCITATE Family Communication: No family at bedside  Disposition Plan: Continue Radiation Treatment and PT recommends SNF versus home health; will likely remain inpatient for radiation treatments with planned completion on 07/17/2018  Consultants:   Palliative Care Medicine   Medical Oncology   Radiation Oncology  Orthopedic Surgery  Interventional Radiology  Urology   Procedures:  CT guided biopsy lytic right pelvic bone lesion on 06/27/2018 by IR  Antimicrobials:  Anti-infectives (From admission, onward)   Start     Dose/Rate Route Frequency Ordered Stop   06/27/18 1000  fluconazole (DIFLUCAN) tablet 100 mg  Status:  Discontinued     100 mg Oral Daily 06/27/18 0625 07/05/18 0944   06/24/18 0700  vancomycin (VANCOCIN) 1,250 mg in sodium chloride 0.9 % 250 mL IVPB  Status:  Discontinued     1,250 mg 166.7 mL/hr over 90 Minutes Intravenous Every 12 hours 06/23/18 1916 06/25/18 0815   06/22/18 1300  cefTRIAXone (ROCEPHIN) 2 g in sodium chloride 0.9 % 100 mL IVPB  Status:  Discontinued     2 g 200 mL/hr over 30 Minutes Intravenous Every 24 hours 06/22/18 1217 06/25/18 0758   06/22/18 0700  vancomycin (VANCOCIN) 1,000 mg in sodium chloride 0.9 % 250 mL IVPB  Status:  Discontinued     1,000 mg 250 mL/hr over 60 Minutes Intravenous Every 12 hours 06/21/18 1813 06/23/18 1916   06/22/18 0300  piperacillin-tazobactam (ZOSYN) IVPB 3.375 g  Status:  Discontinued     3.375 g 12.5 mL/hr over 240 Minutes Intravenous Every 8 hours 06/21/18 1813 06/22/18 1217   06/21/18 1700  piperacillin-tazobactam (ZOSYN) IVPB 3.375 g     3.375 g 100 mL/hr over 30 Minutes Intravenous  Once 06/21/18 1650 06/21/18 1847   06/21/18 1700  vancomycin (VANCOCIN) 2,000 mg in sodium chloride 0.9 % 500 mL IVPB     2,000 mg 250 mL/hr over 120 Minutes Intravenous   Once 06/21/18 1658 06/21/18 2200     Subjective: Seen and examined at bedside and he is a little distraught about hearing about his prognosis with the sarcoma.  Wants to get his second opinion and that is when he is focused on.  Was tearful this morning but states pain was controlled.  No other concerns or complaints at this time and to go for his eighth radiation treatment later today.  Objective: Vitals:   07/14/18 0529 07/14/18 2017 07/15/18 0512 07/15/18 1009  BP: 138/78 131/71 129/76 (!) 155/85  Pulse: 89 95 86 87  Resp: 18 18 18    Temp: 98.2 F (36.8 C) 98.1 F (36.7 C) 97.9 F (36.6 C)   TempSrc: Oral Oral Oral   SpO2: 97% 99% 96%   Weight:      Height:        Intake/Output Summary (Last 24 hours) at 07/16/2018 1437 Last data filed at 07/16/2018 1044 Gross per 24 hour  Intake 720 ml  Output 2200 ml  Net -1480 ml   Filed Weights   07/05/18 1133 07/08/18 1100 07/09/18 0800  Weight: 112 kg 119.6 kg 117.6 kg   Examination: Physical Exam:  Constitutional: Well-nourished, well-developed obese elderly Caucasian male who is very tearful today Eyes: Lids and conjunctive are normal.  Sclera anicteric ENMT: External ears and nose appear normal.  Mucous members are moist Neck: Appears supple no JVD Respiratory: Diminished auscultation bilaterally no appreciable wheezing, rales, rhonchi.  Patient not tachypneic or using any accessory muscle breathe Cardiovascular: Irregularly irregular.  Has 2+ lower extremity edema Abdomen: Soft, nontender, distended secondary body habitus.  Bowel sounds present.  Has some mild bruising on his abdomen from injections previously GU: Has very large penoscrotal swelling and an indwelling Foley catheter in place.  Penoscrotal swelling appeared slightly improved from yesterday and scrotum was elevated using a towel Musculoskeletal: No contractures or cyanosis.  No joint deformities upper extremities but remains severely volume overloaded in his  anasarcic with his right extremity bigger than his left due to DVT Skin: Skin is warm and dry no appreciable rashes or lesions on to skin evaluation Neurologic: Cranial nerves II through XII grossly intact no appreciable focal deficits Psychiatric: Appears very depressed and has a flat affect and is tearful today.  He is awake, alert and oriented  Data Reviewed: I have personally reviewed following labs and imaging studies  CBC: Recent Labs  Lab 07/12/18 0356 07/13/18 0355 07/14/18 0400 07/15/18 0537 07/16/18 0607  WBC 8.1 7.3 6.6 6.2 6.2  NEUTROABS 7.0 6.1 5.6 5.1 4.9  HGB 10.4* 10.6* 10.4* 10.7* 10.7*  HCT 32.4* 33.4* 32.8* 33.5* 32.5*  MCV 101.6* 101.8* 100.6* 100.0 100.6*  PLT 124* 129* 118* 125* 509*   Basic Metabolic Panel: Recent Labs  Lab 07/12/18 0356 07/13/18 0355 07/14/18 0400 07/15/18 0537 07/16/18 0607  NA 139 138 137 137 138  K 3.8 3.8 3.8 3.7 3.6  CL 111 109 107 106 104  CO2 22 21* 22 23 25   GLUCOSE 95 104* 112* 103* 112*  BUN 24* 23 25* 27* 28*  CREATININE 0.77 0.78 0.82 0.91 0.95  CALCIUM 5.9* 6.1* 6.1* 6.3* 6.5*  MG 1.5* 1.8 1.5* 1.7 1.6*  PHOS 2.3* 2.8 2.9 3.0 3.0   GFR: Estimated Creatinine Clearance: 81.7 mL/min (by C-G formula based on SCr of 0.95 mg/dL). Liver Function Tests: Recent Labs  Lab 07/12/18 0356 07/13/18 0355 07/14/18 0400 07/15/18 0537 07/16/18 0607  AST 59* 56* 54* 49* 50*  ALT 99* 100* 94* 87* 86*  ALKPHOS 445* 463* 464* 461* 502*  BILITOT 0.8 0.9 0.8 0.8 1.0  PROT 4.9* 5.2* 5.0* 5.0* 5.2*  ALBUMIN 2.4* 2.7* 2.4* 2.4* 2.5*   No results for input(s): LIPASE, AMYLASE in the last 168 hours. No results for input(s): AMMONIA in the last 168 hours. Coagulation Profile: No results for input(s): INR, PROTIME in the last 168 hours. Cardiac Enzymes: No results for input(s): CKTOTAL, CKMB, CKMBINDEX, TROPONINI in the last 168 hours. BNP (last 3 results) No results for input(s): PROBNP in the last 8760 hours. HbA1C: No  results for input(s): HGBA1C in the last 72 hours. CBG: Recent Labs  Lab 07/15/18 1144 07/15/18 1644 07/15/18 2148 07/16/18 0737 07/16/18 1127  GLUCAP 124* 162* 111* 125* 144*   Lipid Profile: No results for input(s): CHOL, HDL, LDLCALC, TRIG, CHOLHDL, LDLDIRECT in the last 72 hours. Thyroid Function Tests: No results for input(s): TSH, T4TOTAL, FREET4, T3FREE, THYROIDAB in the last 72 hours. Anemia Panel: No results for input(s): VITAMINB12, FOLATE, FERRITIN, TIBC, IRON, RETICCTPCT in the last 72 hours. Sepsis Labs: No results for input(s): PROCALCITON, LATICACIDVEN in the last 168 hours.  No results found for this or any previous visit (from the past 240 hour(s)).   RN Pressure Injury Documentation:    Estimated  body mass index is 33.29 kg/m as calculated from the following:   Height as of this encounter: 6\' 2"  (1.88 m).   Weight as of this encounter: 117.6 kg.  Malnutrition Type:  Nutrition Problem: Increased nutrient needs Etiology: post-op healing   Malnutrition Characteristics:  Signs/Symptoms: estimated needs   Nutrition Interventions:  Interventions: MVI, Prostat, Magic cup, Boost Plus   Radiology Studies: No results found.  Scheduled Meds: . apixaban  10 mg Oral BID   Followed by  . [START ON 07/17/2018] apixaban  5 mg Oral BID  . dexamethasone  4 mg Oral Daily  . feeding supplement (ENSURE ENLIVE)  237 mL Oral BID BM  . feeding supplement (PRO-STAT SUGAR FREE 64)  30 mL Oral BID WC  . fentaNYL  1 patch Transdermal Q72H  . furosemide  40 mg Intravenous BID  . insulin aspart  0-15 Units Subcutaneous TID WC  . insulin glargine  10 Units Subcutaneous QHS  . lidocaine  1 application Topical Once  . metoprolol succinate  50 mg Oral Daily  . multivitamin with minerals  1 tablet Oral Daily  . OLANZapine  2.5 mg Oral QHS  . opium-belladonna  1 suppository Rectal Q8H  . pantoprazole  40 mg Oral BID  . sodium bicarbonate  1,300 mg Oral BID  . sodium  chloride flush  10-40 mL Intracatheter Q12H  . sodium chloride flush  3 mL Intravenous Q12H  . tamsulosin  0.4 mg Oral Daily   Continuous Infusions: . sodium chloride      LOS: 25 days   Kerney Elbe, DO [facility] PAGER is on AMION  If 7PM-7AM, please contact night-coverage [email or web address] Password [facility] 07/16/2018, 2:37 PM

## 2018-07-16 NOTE — Progress Notes (Signed)
Daily Progress Note   Patient Name: Lucas Morales       Date: 5/95/6387 DOB: [birth year]  Age: 85 y.o. MRN#: [id number] Attending Physician: Kerney Elbe, DO Primary Care Physician: Deland Pretty, MD Admit Date: 06/21/2018  Reason for Consultation/Follow-up: Non pain symptom management, Pain control and Psychosocial/spiritual support  Subjective: Patient with renewed energy this am.  States he ate well, he slept well after his diarrhea stopped (imodium helped), and his pain is eliminated other than short episodes of stabbing pain at the end of his penis.  We discussed considering a suprapubic catheter for comfort (rather than a foley).  As his pain is decreased Lucas Morales does not want to consider a suprapubic catheter.  He wants to avoid unnecessary procedures.  He expressed determination to fight.  I could take the easy path but I'm not going to do that.  I'm going to fight this.  Patient requested an inpatient transfer to [facility] to be evaluated by Oncology.  I explained that Oncology evaluations were normally outpatient and that Dr. Marin Olp is working on the referral.    He requested that I call his daughter to discuss logistics and ensure she is up to date.  We conferenced Lucas Morales on Face Time and the 3 of [place] talked together.  Lucas Morales requested a copy of his path report which I provided him.  He did not get out of bed yesterday.  I encouraged him to get up and walk with assistance this afternoon.  I had a private conversation with his daughter Lucas Morales during which I expressed my concerns about his short prognosis.  We talked about engaging Hospice Services after obtaining the second opinion.   Assessment: Increased energy, eating better.   Patient Profile/HPI:  85 y.o. male   with past medical history of CAD, Afib, alpha 1 antitrypsin deficiency, interstitial cystitis (with a tendency to have urinary retention), DM, depression and anxiety who was admitted on 06/21/2018 with what was thought to be a post op infection at L3-L4.  Work up has revealed widespread (lungs, liver, pelvis, bone) metastatic cancer of undetermined primary.  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   PMT has been asked to consult for symptom management.     Length of Stay: 25  Current Medications: Scheduled Meds:  . apixaban  10 mg Oral BID   Followed by  . [START ON 07/17/2018] apixaban  5 mg Oral BID  . dexamethasone  4 mg Oral Daily  . feeding supplement (ENSURE ENLIVE)  237 mL Oral BID BM  . feeding supplement (PRO-STAT SUGAR FREE 64)  30 mL Oral BID WC  . fentaNYL  1 patch Transdermal Q72H  . furosemide  40 mg Intravenous BID  . insulin aspart  0-15 Units Subcutaneous TID WC  . insulin glargine  10 Units Subcutaneous QHS  . lidocaine  1 application Topical Once  . metoprolol succinate  50 mg Oral Daily  . multivitamin with minerals  1 tablet Oral Daily  . OLANZapine  2.5 mg Oral QHS  . opium-belladonna  1 suppository Rectal Q8H  . pantoprazole  40 mg Oral BID  . sodium bicarbonate  1,300 mg Oral BID  . sodium chloride flush  10-40 mL Intracatheter Q12H  . sodium chloride flush  3 mL Intravenous Q12H  . tamsulosin  0.4 mg Oral Daily    Continuous Infusions: . sodium chloride      PRN Meds: sodium chloride, acetaminophen, alum & mag hydroxide-simeth, bisacodyl, HYDROmorphone (DILAUDID) injection, HYDROmorphone, loperamide, LORazepam, menthol-cetylpyridinium, neomycin-bacitracin-polymyxin, ondansetron **OR** ondansetron (ZOFRAN) IV, sodium chloride flush, sodium chloride flush  Physical Exam        Well developed male, increased energy today, but still has some difficulty with the strength of his voice.  Awake, alert coherent.  Vital Signs:  BP (!) 155/85   Pulse 87   Temp 97.9 F (36.6 C) (Oral)   Resp 18   Ht 6\' 2"  (1.88 m)   Wt 117.6 kg   SpO2 96%   BMI 33.29 kg/m  SpO2: SpO2: 96 % O2 Device: O2 Device: Room Air O2 Flow Rate: O2 Flow Rate (L/min): 2 L/min  Intake/output summary:   Intake/Output Summary (Last 24 hours) at 07/16/2018 1233 Last data filed at 07/16/2018 1044 Gross per 24 hour  Intake 720 ml  Output 2200 ml  Net -1480 ml   LBM: Last BM Date: 07/15/18 Baseline Weight: Weight: 101.3 kg Most recent weight: Weight: 117.6 kg       Palliative Assessment/Data: 40%     Patient Active Problem List   Diagnosis Date Noted  . Lung nodules 07/03/2018  . Bladder spasm   . Metastatic cancer ([place])   . Pelvic mass   . Type II diabetes mellitus ([place]) 06/27/2018  . Coronary artery disease 06/27/2018  . AKI (acute kidney injury) ([place]) 06/27/2018  . Cancer associated pain   . DNR (do not resuscitate)   . Palliative care encounter   . Deep venous thrombosis ([place]) 06/26/2018  . 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 Status post lumbar spine surgery for decompression of spinal cord 05/24/2018  . Abnormal EKG 05/25/2017  . S/P cervical spinal fusion 01/25/2016  . Cervical spinal stenosis 12/20/2015  . Cervical spondylosis 12/20/2015  . Painful orthopaedic hardware left hip with chronic trochanteric bursitis 07/30/2015  . Hip bursitis 07/30/2015  . Trochanteric bursitis of left hip 07/30/2015  . Chest pain with moderate risk of acute coronary syndrome 10/19/2014  . Macular hole, left eye 09/01/2014  . Macular hole of left eye 08/28/2014  . Other pancytopenia ([place])   . Sliding hiatal hernia   . Gastric erosions   . GI bleeding 06/04/2014  . Acute GI bleeding 06/04/2014  . Orthostatic hypotension 06/04/2014  . Acute upper gastrointestinal bleeding 06/04/2014  . Other specified diabetes mellitus without  complications ([place])   . Acute blood loss anemia   . Thrombocytopenia ([place])   . Closed left  hip fracture ([place]) 04/03/2014  . Fracture, femur ([place]) 04/03/2014  . Femur fracture ([place]) 04/03/2014  . Upper airway cough syndrome 05/16/2013  . OSA (obstructive sleep apnea) 10/21/2012  . Macular hole 10/08/2012  . Preoperative clearance 09/30/2012  . PAF (paroxysmal atrial fibrillation) ([place]) 01/21/2012  . Shortness of breath on exertion 01/11/2012  . Bradycardia, sinus 01/11/2012  . Hx of CABG 01/11/2012  . Essential hypertension   . Dyslipidemia   . ABDOMINAL PAIN-LLQ 02/04/2010  . HEMORRHOIDS 08/31/2008  . ESOPHAGEAL STRICTURE 08/31/2008  . GERD 08/31/2008  . Gastritis and gastroduodenitis 08/31/2008  . HIATAL HERNIA 08/31/2008  . DIVERTICULOSIS, COLON 08/31/2008  . Chronic interstitial cystitis 08/31/2008  . COLONIC POLYPS, ADENOMATOUS, HX OF 08/31/2008    Palliative Care Plan    Recommendations/Plan:  Continue Olanzepine 2.5 mg.  If needed please titrate up to 5 mg.  This helps his appetite, sleep and pain.  Continue oral dilaudid 8 mg q 4 hours PRN  Continue fentanyl 50 mcg TD.  He is not requiring the IV dilaudid any longer.  The stabbing pain at the tip of his penis is difficult to control.  Would utilize lidocaine gel, and Belladonna suppositories.  PMT will continue to follow intermittently for symptom management  Goals of Care and Additional Recommendations:  Limitations on Scope of Treatment: Full Scope Treatment.   He wants aggressive treatment for the osteosarcoma.  Code Status:  DNR  Prognosis:   weeks to months.   Discharge Planning:  Home with Palliative Services  Care plan was discussed with patient and daughter.  Thank you for allowing the Palliative Medicine Team to assist in the care of this patient.  Total time spent:  35 min.     Greater than 50%  of this time was spent counseling and coordinating care related to the above assessment and plan.  Florentina Jenny, PA-C Palliative Medicine  Please contact Palliative  MedicineTeam phone at [phone] for questions and concerns between 7 am - 7 pm.   Please see AMION for individual provider pager numbers.

## 2018-07-16 NOTE — Addendum Note (Signed)
Encounter addended by: Jorene Minors, RN on: 07/16/2018 3:55 AM  Actions taken: MAR administration accepted, Flowsheet accepted

## 2018-07-16 NOTE — Addendum Note (Signed)
Encounter addended by: Martyn Malay, RN on: 07/16/2018 12:10 PM  Actions taken: [facility] administration accepted

## 2018-07-16 NOTE — Progress Notes (Signed)
Patient ID: Lucas Morales, male   DOB: [birth year], 85 y.o.   MRN: [id number]    Subjective: Pt diagnosed with sarcoma per Dr. Antonieta Pert note.  Plan to continue palliative radiation and will consider a 2nd opinion at [facility]. He continues to experience penoscrotal and lower extremity edema.  Catheter pain/discomfort is still present but improved. Getting B&O suppositories around the clock.  Objective: Vital signs in last 24 hours: Temp:  [97 F (36.1 C)-98.1 F (36.7 C)] 97.6 F (36.4 C) (04/28 0617) Pulse Rate:  [86-97] 90 (04/28 0617) Resp:  [16-20] 20 (04/28 0617) BP: (112-134)/(73-81) 112/80 (04/28 0617) SpO2:  [89 %-97 %] 97 % (04/28 0617)  Intake/Output from previous day: 04/27 0701 - 04/28 0700 In: 32 [P.O.:680; I.V.:10] Out: 2850 [Urine:2850] Intake/Output this shift: Total I/O In: 240 [P.O.:240] Out: 750 [Urine:750]  Physical Exam:  General: Alert and oriented GU: Penoscrotal edema remains significant but slightly better since Sunday.  Lab Results: Recent Labs    07/14/18 0400 07/15/18 0537 07/16/18 0607  HGB 10.4* 10.7* 10.7*  HCT 32.8* 33.5* 32.5*   BMET Recent Labs    07/15/18 0537 07/16/18 0607  NA 137 138  K 3.7 3.6  CL 106 104  CO2 23 25  GLUCOSE 103* 112*  BUN 27* 28*  CREATININE 0.91 0.95  CALCIUM 6.3* 6.5*     Studies/Results: No results found.  Assessment/Plan: 1) Penoscrotal edema: Continue current measures of scrotal elevation, ambulation, diuresis as appropriate.  2) Urinary retention: Continue alpha blocker.  Consider voiding trial once penoscrotal edema improved.  3) Bladder spasms/penile pain: Continue B&O suppositories around the clock for now and local symptoms relief prn.   LOS: 25 days   [name] 07/16/2018, 12:44 PM

## 2018-07-16 NOTE — Progress Notes (Signed)
Physical Therapy Treatment Patient Details Name: Lucas Morales MRN: [id number] DOB: 1933/09/12 Today's Date: 07/16/2018    History of Present Illness This is an 85 year old male with a past medical history including hypertension, depression and anxiety, coronary artery disease, chronic interstitial cystitis, previous left hip fracture, previous cervical fusion, hyperlipidemia, restless leg syndrome, diabetes, arthritis, alpha 1 antitrypsin deficiency carrier, anemia, chronic back pain who was admitted for suspected postoperative lumbar decompression at L3-4 infection.  Surgery date was 05/10/2018. MRI suspicious for osteomyelitis. CT scan revealed R medial acetabular fx as well as lesions on liver suspicious for cancer.     PT Comments    Called to room by RN, pt feeling better and wanting to get OOB to walk.  Pt required Minimal Assist and tolerated a great distance.  General Gait Details: tolerated a great distance using walker for mild instability and x 4 seated rest breaks to complete total distance. AVG HR 109.  Minimal hip pain.     Follow Up Recommendations  SNF  if progresses could D/C to home with family support   Equipment Recommendations  Wheelchair cushion (measurements PT);Wheelchair (measurements PT);Hospital bed    Recommendations for Other Services       Precautions / Restrictions Precautions Precautions: Fall Restrictions Weight Bearing Restrictions: No RLE Weight Bearing: Weight bearing as tolerated    Mobility  Bed Mobility Overal bed mobility: Needs Assistance Bed Mobility: Supine to Sit;Sit to Supine     Supine to sit: Mod assist;Min assist Sit to supine: Min assist;Mod assist   General bed mobility comments: assist with upper body supine to sit and B LE sit to supine   Transfers Overall transfer level: Needs assistance Equipment used: Rolling walker (2 wheeled) Transfers: Sit to/from Stand Sit to Stand: Min guard;Min assist         General  transfer comment: improved ability to self rise useing B UE's   Ambulation/Gait Ambulation/Gait assistance: Min assist;+2 safety/equipment Gait Distance (Feet): 350 Feet(4 seated rest breaks ) Assistive device: Rolling walker (2 wheeled) Gait Pattern/deviations: Step-through pattern;Decreased stride length;Trunk flexed Gait velocity: decreased    General Gait Details: tolerated a great distance using walker for mild instability and x 4 seated rest breaks to complete total distance. AVG HR 109.  Minimal hip pain.     Stairs             Wheelchair Mobility    Modified Rankin (Stroke Patients Only)       Balance                                            Cognition Arousal/Alertness: Awake/alert Behavior During Therapy: WFL for tasks assessed/performed Overall Cognitive Status: Within Functional Limits for tasks assessed                                 General Comments: pt feeling better      Exercises      General Comments        Pertinent Vitals/Pain Pain Assessment: Faces Pain Score: 3  Pain Location: penis Pain Descriptors / Indicators: Grimacing;Sore Pain Intervention(s): Monitored during session    Home Living                      Prior Function  PT Goals (current goals can now be found in the care plan section) Progress towards PT goals: Progressing toward goals    Frequency    Min 3X/week      PT Plan Current plan remains appropriate    Co-evaluation              AM-PAC PT "6 Clicks" Mobility   Outcome Measure  Help needed turning from your back to your side while in a flat bed without using bedrails?: A Little Help needed moving from lying on your back to sitting on the side of a flat bed without using bedrails?: A Little Help needed moving to and from a bed to a chair (including a wheelchair)?: A Little Help needed standing up from a chair using your arms (e.g., wheelchair  or bedside chair)?: A Little Help needed to walk in hospital room?: A Little Help needed climbing 3-5 steps with a railing? : A Little 6 Click Score: 18    End of Session Equipment Utilized During Treatment: Gait belt Activity Tolerance: Patient tolerated treatment well Patient left: in bed;with call bell/phone within reach Nurse Communication: Mobility status;Patient requests pain meds PT Visit Diagnosis: Muscle weakness (generalized) (M62.81);Difficulty in walking, not elsewhere classified (R26.2)     Time: 3790-2409 PT Time Calculation (min) (ACUTE ONLY): 28 min  Charges:  $Gait Training: 8-22 mins $Therapeutic Activity: 8-22 mins                     Rica Koyanagi  PTA Acute  Rehabilitation Services Pager      [phone] Office      [phone]

## 2018-07-16 NOTE — Addendum Note (Signed)
Encounter addended by: Martyn Malay, RN on: 07/16/2018 5:01 PM  Actions taken: [facility] administration accepted

## 2018-07-16 NOTE — Addendum Note (Signed)
Encounter addended by: Martyn Malay, RN on: 07/16/2018 6:15 PM  Actions taken: [facility] administration accepted

## 2018-07-16 NOTE — Addendum Note (Signed)
Encounter addended by: Martyn Malay, RN on: 07/16/2018 10:55 AM  Actions taken: MAR administration accepted

## 2018-07-16 NOTE — Addendum Note (Signed)
Encounter addended by: Martyn Malay, RN on: 07/16/2018 5:08 PM  Actions taken: [facility] administration accepted, Flowsheet accepted

## 2018-07-16 NOTE — Progress Notes (Signed)
Unfortunately, I did get the results back from his molecular array studies.  It shows that this cancer is 90% probable for sarcoma.  It is likely to be an osteosarcoma.  I really am shocked by this.  I would suspect that the primary site is in the pelvis.  I talked to Dr. [name] this morning about the result.  I told him that this is probably the worst kind of cancer that he could have.  Treatment for this is incredibly difficult and not that effective.  I really doubt that he would even tolerate treatment if we would offer it.  He is not willing to "give up.".  He would like to have a second opinion.  I will arrange for this to be at [facility].  I like the sarcoma expert there.  I told him that given the diagnosis of sarcoma, I doubt that he would have more than 2-3 months.  This really does not bother him and he still would like to have a second opinion.  He is doing radiation.  He is doing well with radiation.  I still think that most of his pain is from the catheter in his bladder.  There is still a lot of scrotal swelling.  I was wondering if a suprapubic catheter could be placed by urology.  This would make Dr. Patsey Berthold life a lot easier and I think would improve his pain.  As such, maybe urology can come by to see Dr. [name] and try to help [place] out.  We are clearly dealing with quality of life now.  He has a malignancy that is obviously incredibly aggressive and there are few treatment options that he would be able to tolerate given his overall performance status.  Anything that we can do to try to improve his quality of life I think we should try to do.  His labs all look all that bad.  White cell count 6.2.  Hemoglobin 10.7.  Platelet count 127,000.  His alkaline phosphatase is 502.  His albumin is 2.5.  His creatinine is 0.95.  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  He certainly is 1 of the older patients that I have seen that has a sarcoma.  I appreciate the great care that he is  getting from everybody up on [address] E.  I know everybody is trying their best to help him and to give him a better quality of life.  Lattie Haw, MD  Proverbs 16:3

## 2018-07-17 ENCOUNTER — Ambulatory Visit
Admit: 2018-07-17 | Discharge: 2018-07-17 | Disposition: A | Discharge status: Home / Self Care | Payer: Medicare Other | Attending: Radiation Oncology | Admitting: Radiation Oncology

## 2018-07-17 DIAGNOSIS — Z7189 Other specified counseling: Secondary | ICD-10-CM

## 2018-07-17 LAB — COMPREHENSIVE METABOLIC PANEL
ALT: 105 U/L — ABNORMAL HIGH (ref 0–44)
AST: 66 U/L — ABNORMAL HIGH (ref 15–41)
Albumin: 2.5 g/dL — ABNORMAL LOW (ref 3.5–5.0)
Alkaline Phosphatase: 570 U/L — ABNORMAL HIGH (ref 38–126)
Anion gap: 8 (ref 5–15)
BUN: 31 mg/dL — ABNORMAL HIGH (ref 8–23)
CO2: 27 mmol/L (ref 22–32)
Calcium: 6.4 mg/dL — CL (ref 8.9–10.3)
Chloride: 103 mmol/L (ref 98–111)
Creatinine, Ser: 1.03 mg/dL (ref 0.61–1.24)
GFR calc Af Amer: >60 mL/min (ref >60)
GFR calc non Af Amer: >60 mL/min (ref >60)
Glucose, Bld: 105 mg/dL — ABNORMAL HIGH (ref 70–99)
Potassium: 3.5 mmol/L (ref 3.5–5.1)
Sodium: 138 mmol/L (ref 135–145)
Total Bilirubin: 0.6 mg/dL (ref 0.3–1.2)
Total Protein: 5.3 g/dL — ABNORMAL LOW (ref 6.5–8.1)

## 2018-07-17 LAB — GLUCOSE, CAPILLARY
Glucose-Capillary: 91 mg/dL (ref 70–99)
Glucose-Capillary: 135 mg/dL — ABNORMAL HIGH (ref 70–99)
Glucose-Capillary: 192 mg/dL — ABNORMAL HIGH (ref 70–99)
Glucose-Capillary: 117 mg/dL — ABNORMAL HIGH (ref 70–99)

## 2018-07-17 LAB — CBC WITH DIFFERENTIAL/PLATELET
Abs Immature Granulocytes: 0.04 10*3/uL (ref 0.00–0.07)
Basophils Absolute: 0 10*3/uL (ref 0.0–0.1)
Basophils Relative: 0 %
Eosinophils Absolute: 0 10*3/uL (ref 0.0–0.5)
Eosinophils Relative: 0 %
HCT: 32.8 % — ABNORMAL LOW (ref 39.0–52.0)
Hemoglobin: 10.5 g/dL — ABNORMAL LOW (ref 13.0–17.0)
Immature Granulocytes: 1 %
Lymphocytes Relative: 16 %
Lymphs Abs: 0.8 10*3/uL (ref 0.7–4.0)
MCH: 32.2 pg (ref 26.0–34.0)
MCHC: 32 g/dL (ref 30.0–36.0)
MCV: 100.6 fL — ABNORMAL HIGH (ref 80.0–100.0)
Monocytes Absolute: 0.2 10*3/uL (ref 0.1–1.0)
Monocytes Relative: 4 %
Neutro Abs: 4.2 10*3/uL (ref 1.7–7.7)
Neutrophils Relative %: 79 %
Platelets: 130 10*3/uL — ABNORMAL LOW (ref 150–400)
RBC: 3.26 MIL/uL — ABNORMAL LOW (ref 4.22–5.81)
RDW: 16.3 % — ABNORMAL HIGH (ref 11.5–15.5)
WBC: 5.3 10*3/uL (ref 4.0–10.5)
nRBC: 0 % (ref 0.0–0.2)

## 2018-07-17 LAB — MAGNESIUM: Magnesium: 1.7 mg/dL (ref 1.7–2.4)

## 2018-07-17 LAB — LACTATE DEHYDROGENASE: LDH: 208 U/L — ABNORMAL HIGH (ref 98–192)

## 2018-07-17 LAB — PHOSPHORUS: Phosphorus: 3.2 mg/dL (ref 2.5–4.6)

## 2018-07-17 MED ORDER — ALTEPLASE 2 MG IJ SOLR
2.0000 mg | Freq: Once | INTRAMUSCULAR | Status: AC
  Administered 2018-07-17: 22:00:00 2 mg
  Filled 2018-07-17: qty 2

## 2018-07-17 MED ORDER — ALTEPLASE 2 MG IJ SOLR
2.0000 mg | Freq: Once | INTRAMUSCULAR | Status: DC
  Filled 2018-07-17: qty 2

## 2018-07-17 MED ORDER — CALCIUM GLUCONATE-NACL 2-0.675 GM/100ML-% IV SOLN
2.0000 g | Freq: Once | INTRAVENOUS | Status: AC
  Administered 2018-07-17: 06:00:00 2000 mg via INTRAVENOUS
  Filled 2018-07-17: qty 100

## 2018-07-17 NOTE — Progress Notes (Signed)
PROGRESS NOTE    Lucas Morales  NTI:[id number] DOB: [birth year] DOA: 06/21/2018 PCP: Deland Pretty, MD   Brief Narrative: Lucas Morales is a 85 y.o. malewith medical history significant of adenomatous polyps, alpha-1 antitrypsin deficiency carrier, anemia, anxiety, depression, osteoarthritis of the knees and back, easy bruising, chronic bronchitis, chronic lower back pain, CAD, history of frequent diarrhea due to partial colectomy, diverticulosis, episodes of diverticulitis, hemorrhoids, gastritis, hiatal hernia, GERD, esophageal stricture, esophageal dysmotility, history of post CABG paroxysmal A. fib, history of bradycardia, hyperlipidemia, hypertension, OSA not on CPAP due to intolerance, restless leg syndrome, sciatic nerve pain, type 2 diabetes, urinary frequency whohad lumbar decompression L3-4 on 05/10/2018. 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 Poor prognosis per oncology. Completed radiation therapy this morning. Plan for second opinion with regard to cancer diagnosis.  Acute renal failure Initially secondary to decreased oral intake. Given IV fluids with improvement. Now worsening in setting of IV diuresis. -Recheck BMP  Scrotal edema Anasarca Secondary to low albumin and aggressive IV resuscitation. Started on IV lasix with some improvement. Urology consulted. Foley  placed. Associated urinary retention.  -Continue Lasix -Voiding trial tomorrow if forced to discontinue lasix -Continue B&O suppositories in addition to lidocaine  L3 lesion Initially thought to be infectious and patient started on IV antibiotics. Likely metastatic, however. Antibiotics discontinued.  Essential hypertension -Continue metoprolol  Paroxysmal A. Fibrillation -Continue metoprolol and Eliquis  OSA Not on outpatient CPAP. On oxygen as needed. Currently on room air.  Acute DVT -Continue Eliquis  Diabetes mellitus, type 2 -Continue Lantus, SSI  CAD -Continue metoprolol  Macrocytic anemia Stable.  Abnormal LFTs Elevated alkaline phosphatase In setting of liver metastasis  Hyponatremia Resolved with IV fluids.  Hypokalemia Resolved.  Hypomagnesemia Resolved with repletion.  Metabolic acidosis Resolveed  Thrombocytopenia Stable.  Obesity Body mass index is 30.65 kg/m.   DVT prophylaxis: Eliquis Code Status:   Code Status: DNR Family Communication: Daughter and Son Disposition Plan: Discharge likely in 48-72 hours pending improvement of scrotal edema, possible voiding trial. Plan for discharge home.   Consultants:   Palliative Care Medicine   Medical Oncology   Radiation Oncology  Orthopedic Surgery  Interventional Radiology  Urology  Procedures:   CT guided biopsy (4/9)  Antimicrobials:  Vancomycin  Zosyn  Ceftriaxone  Fluconazole    Subjective: Penile pain is improved. States he does not have support at home for discharge. No other issues overnight.  Objective: Vitals:   07/14/18 2017 07/15/18 0512 07/15/18 1009 07/17/18 0623  BP: 131/71 129/76 (!) 155/85   Pulse: 95 86 87   Resp: 18 18    Temp: 98.1 F (36.7 C) 97.9 F (36.6 C)    TempSrc: Oral Oral    SpO2: 99% 96%    Weight:    108.3 kg  Height:        Intake/Output  Summary (Last 24 hours) at 07/17/2018 1713 Last data filed at 07/17/2018 1051 Gross per  24 hour  Intake 107.8 ml  Output 2050 ml  Net -1942.2 ml   Filed Weights   07/08/18 1100 07/09/18 0800 07/17/18 0623  Weight: 119.6 kg 117.6 kg 108.3 kg    Examination:  General exam: Appears calm and comfortable Respiratory system: Clear to auscultation. Respiratory effort normal. Cardiovascular system: S1 & S2 heard, RRR. No murmurs, rubs, gallops or clicks. Gastrointestinal system: Abdomen is nondistended, soft and nontender. No organomegaly or masses felt. Normal bowel sounds heard. Central nervous system: Alert and oriented. No focal neurological deficits. GU: significant scrotal and penile edema without tenderness Extremities: No calf tenderness Skin: No cyanosis. No rashes Psychiatry: Judgement and insight appear normal. Mood & affect appropriate.     Data Reviewed: I have personally reviewed following labs and imaging studies  CBC: Recent Labs  Lab 07/13/18 0355 07/14/18 0400 07/15/18 0537 07/16/18 0607 07/17/18 0424  WBC 7.3 6.6 6.2 6.2 5.3  NEUTROABS 6.1 5.6 5.1 4.9 4.2  HGB 10.6* 10.4* 10.7* 10.7* 10.5*  HCT 33.4* 32.8* 33.5* 32.5* 32.8*  MCV 101.8* 100.6* 100.0 100.6* 100.6*  PLT 129* 118* 125* 127* 409*   Basic Metabolic Panel: Recent Labs  Lab 07/13/18 0355 07/14/18 0400 07/15/18 0537 07/16/18 0607 07/17/18 0424  NA 138 137 137 138 138  K 3.8 3.8 3.7 3.6 3.5  CL 109 107 106 104 103  CO2 21* 22 23 25 27   GLUCOSE 104* 112* 103* 112* 105*  BUN 23 25* 27* 28* 31*  CREATININE 0.78 0.82 0.91 0.95 1.03  CALCIUM 6.1* 6.1* 6.3* 6.5* 6.4*  MG 1.8 1.5* 1.7 1.6* 1.7  PHOS 2.8 2.9 3.0 3.0 3.2   GFR: Estimated Creatinine Clearance: 72.4 mL/min (by C-G formula based on SCr of 1.03 mg/dL). Liver Function Tests: Recent Labs  Lab 07/13/18 0355 07/14/18 0400 07/15/18 0537 07/16/18 0607 07/17/18 0424  AST 56* 54* 49* 50* 66*  ALT 100* 94* 87* 86* 105*  ALKPHOS 463* 464* 461* 502* 570*  BILITOT 0.9 0.8 0.8 1.0 0.6  PROT 5.2* 5.0* 5.0* 5.2* 5.3*   ALBUMIN 2.7* 2.4* 2.4* 2.5* 2.5*   No results for input(s): LIPASE, AMYLASE in the last 168 hours. No results for input(s): AMMONIA in the last 168 hours. Coagulation Profile: No results for input(s): INR, PROTIME in the last 168 hours. Cardiac Enzymes: No results for input(s): CKTOTAL, CKMB, CKMBINDEX, TROPONINI in the last 168 hours. BNP (last 3 results) No results for input(s): PROBNP in the last 8760 hours. HbA1C: No results for input(s): HGBA1C in the last 72 hours. CBG: Recent Labs  Lab 07/16/18 1616 07/16/18 2126 07/17/18 0743 07/17/18 1135 07/17/18 1640  GLUCAP 199* 115* 91 135* 192*   Lipid Profile: No results for input(s): CHOL, HDL, LDLCALC, TRIG, CHOLHDL, LDLDIRECT in the last 72 hours. Thyroid Function Tests: No results for input(s): TSH, T4TOTAL, FREET4, T3FREE, THYROIDAB in the last 72 hours. Anemia Panel: No results for input(s): VITAMINB12, FOLATE, FERRITIN, TIBC, IRON, RETICCTPCT in the last 72 hours. Sepsis Labs: No results for input(s): PROCALCITON, LATICACIDVEN in the last 168 hours.  No results found for this or any previous visit (from the past 240 hour(s)).       Radiology Studies: No results found.      Scheduled Meds: . apixaban  5 mg Oral BID  . dexamethasone  4 mg Oral Daily  . fentaNYL  1 patch Transdermal Q72H  . furosemide  40  mg Intravenous BID  . insulin aspart  0-15 Units Subcutaneous TID WC  . insulin glargine  10 Units Subcutaneous QHS  . lidocaine  1 application Topical Once  . metoprolol succinate  50 mg Oral Daily  . multivitamin with minerals  1 tablet Oral Daily  . OLANZapine  2.5 mg Oral QHS  . opium-belladonna  1 suppository Rectal Q8H  . pantoprazole  40 mg Oral BID  . sodium bicarbonate  1,300 mg Oral BID  . sodium chloride flush  10-40 mL Intracatheter Q12H  . sodium chloride flush  3 mL Intravenous Q12H  . tamsulosin  0.4 mg Oral Daily   Continuous Infusions: . sodium chloride       LOS: 26 days   >35  minutes spent with direct patient care, inaddition to documentation and discussion with patient's HCPOA with regard to ongoing medical treatments for edema in addition to outpatient follow-up/discharge management. 33 minutes spent via telephone call.   Cordelia Poche, MD [facility] 07/17/2018, 5:13 PM  If 7PM-7AM, please contact night-coverage [email or web address]

## 2018-07-17 NOTE — Progress Notes (Signed)
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  He says that he walked yesterday.  Again, his pain is mostly centered where the urinary catheter is.  He was seen by urology yesterday.  They made some recommendations.  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  He really would like to have a second opinion.  I will get a hold of Dr. Sharee Holster at [facility].  He is the sarcoma expert.  We will have to see what he says.  Given Dr. Patsey Berthold performance status, I would be shocked if any type of chemotherapy was offered.  I know that palliative care is seeing him.  I had a long talk with his son today.  His son seems very nice and very motivated to help his dad.  I told his son that I thought that hospice is going to be what we will need.  We get hospice going after [name] has the second opinion at [facility].  I told him that I would be shocked if he would be considered for any therapy or clinical trial given his performance status.  I told his son that I still thought that our prognosis is still going to be 2-3 months at best.  His son understood.  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  He has had no bleeding.  His labs today show that his bilirubin is 0.6.  Alkaline phosphatase is 570.  SGPT 66 SGOT 105.  I am sure this probably reflects his liver metastasis.  His white cell count is 5.3.  Hemoglobin 10.5.  Platelet count 130,000.  He says his appetite is doing better.  He is not having any nausea or vomiting.  He will finish his radiation treatments tomorrow.  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  I think will be critical as his nutrition.  We will continue to follow along.  I very much appreciate the great care that he is getting from everybody up on 5 E.  Lattie Haw, MD  [name] 6:18

## 2018-07-17 NOTE — Progress Notes (Signed)
Palliative:  HPI: 85 yo male with PMH significant of CAD, atrial fibrillation, alpha 1 antitrypsin deficiency, interstitial cystitis, diabetes, depression, anxiety admitted 06/21/2018 for potential post op infection of L3-L4. 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   I went to visit with Lucas Morales today but he has recently returned from radiation therapy and received medication and is hoping to rest. He did speak with me briefly and confirmed that he is happy with his pain management and is comfortable but just tired. I inquired regarding plans for home vs SNF and he directs me to discuss this with his daughter, Lucas Morales. He continues to confirm desire for second opinion at [facility] of which Dr. Marin Olp is planning to arrange for him. I left him to rest.   I called and spoke with daughter, Lucas Morales. Lucas Morales confirms desire to bring her father home. They are arranging extra help through [place]? at home. She is aware of plans for visit at [facility]. We discussed that there may not be any treatment offered there and if treatment is offered there is a chance he may not tolerate treatment well. We discussed next steps with hospice. We discussed anticipated care needs with worsening functional status and symptom burden. Discussed hospice assistance and hospice facility. Discussed challenges of providing care at home with physical and emotional stress and when to consider hospice facility. I also encouraged the importance of self care. All questions/concerns addressed. Emotional support provided.   Exam: Frail, elderly. No distress. Fatigued.   Plan: - Family preparing to care for Lucas Morales at home. They anticipate the use of hospice likely after visit with [facility]. Outpatient palliative in the mean time.  - No changes to symptom management as Lucas Morales reports good relief and  happy with regimen.   25 min  Vinie Sill, NP Palliative Medicine Team Pager # [phone] (M-F 8a-5p) Team Phone # [phone] (Nights/Weekends)

## 2018-07-17 NOTE — Progress Notes (Signed)
Occupational Therapy Treatment and goal update Patient Details Name: Lucas Morales MRN: [id number] DOB: 12-21-1933 Today's Date: 07/17/2018    History of present illness This is an 85 year old male with a past medical history including hypertension, depression and anxiety, coronary artery disease, chronic interstitial cystitis, previous left hip fracture, previous cervical fusion, hyperlipidemia, restless leg syndrome, diabetes, arthritis, alpha 1 antitrypsin deficiency carrier, anemia, chronic back pain who was admitted for suspected postoperative lumbar decompression at L3-4 infection.  Surgery date was 05/10/2018. MRI suspicious for osteomyelitis. CT scan revealed R medial acetabular fx as well as lesions on liver suspicious for cancer.    OT comments  Worked on ADL from commode. Pt really wants to emphasize walking; have incorporated that into tx plan.  Will also try to work on toilet hygiene as pt needs total A  Follow Up Recommendations  Supervision/Assistance - 24 hour;SNF    Equipment Recommendations  3 in 1 bedside commode    Recommendations for Other Services      Precautions / Restrictions Precautions Precautions: Fall Restrictions Weight Bearing Restrictions: No RLE Weight Bearing: Weight bearing as tolerated       Mobility Bed Mobility           Sit to supine: Min assist;Mod assist   General bed mobility comments: assist with bil LEs  Transfers   Equipment used: Rolling walker (2 wheeled)   Sit to Stand: Min assist Stand pivot transfers: Min assist       General transfer comment: light assist to stand and complete pivot back to bed    Balance                                           ADL either performed or assessed with clinical judgement   ADL           Upper Body Bathing: Set up   Lower Body Bathing: Maximal assistance   Upper Body Dressing : Minimal assistance       Toilet Transfer: Minimal  assistance;Stand-pivot;BSC;RW   Toileting- Clothing Manipulation and Hygiene: Total assistance;Sit to/from stand         General ADL Comments: pt was on toilet when OT arrived. Assisted with bathing, changing gown and back to bed. Pt had XRT this am and was fatiqued.  He hasn't been using theraband much.  He stated he really wants to focus on walking     Vision       Perception     Praxis      Cognition Arousal/Alertness: Awake/alert Behavior During Therapy: WFL for tasks assessed/performed Overall Cognitive Status: Within Functional Limits for tasks assessed                                          Exercises     Shoulder Instructions       General Comments      Pertinent Vitals/ Pain       Pain Assessment: No/denies pain  Home Living                                          Prior Functioning/Environment  Frequency  Min 2X/week        Progress Toward Goals  OT Goals(current goals can now be found in the care plan section)  Progress towards OT goals: Progressing toward goals(slowly)  Acute Rehab OT Goals Time For Goal Achievement: 07/31/18 Potential to Achieve Goals: Good ADL Goals Pt Will Perform Grooming: with supervision;standing Pt Will Perform Upper Body Dressing: (discontinue) Pt Will Perform Lower Body Dressing: with min assist;with adaptive equipment;sit to/from stand Pt Will Perform Toileting - Clothing Manipulation and hygiene: with min assist;sit to/from stand Additional ADL Goal #1: pt will ambulate to bathroom to 3:1 over toilet with min guard assist  Plan      Co-evaluation                 AM-PAC OT "6 Clicks" Daily Activity     Outcome Measure   Help from another person eating meals?: None Help from another person taking care of personal grooming?: A Little Help from another person toileting, which includes using toliet, bedpan, or urinal?: A Lot Help from another person  bathing (including washing, rinsing, drying)?: A Lot Help from another person to put on and taking off regular upper body clothing?: A Little Help from another person to put on and taking off regular lower body clothing?: A Lot 6 Click Score: 16    End of Session    OT Visit Diagnosis: Unsteadiness on feet (R26.81);Other abnormalities of gait and mobility (R26.89);Muscle weakness (generalized) (M62.81)   Activity Tolerance Patient limited by fatigue   Patient Left in bed   Nurse Communication          Time: 2993-7169 OT Time Calculation (min): 25 min  Charges: OT General Charges $OT Visit: 1 Visit OT Treatments $Self Care/Home Management : 23-37 mins  Lesle Chris, OTR/L Acute Rehabilitation Services [phone] [place] pager [phone] office 07/17/2018   [place] 07/17/2018, 11:06 AM

## 2018-07-17 NOTE — Progress Notes (Addendum)
CRITICAL VALUE ALERT  Critical Value: calcium- 6.2  Date & Time Notied: 07/17/2018 @0515   Provider Notified: Raliegh Ip Schorr  Orders Received/Actions taken: 2g calcium gluconate IVPB

## 2018-07-17 NOTE — Progress Notes (Signed)
IV nurse checked patient's PICC line at 1800. The red port is flushed well but the purple one is not and recommended to get it out if possible. Page MD as well.

## 2018-07-17 NOTE — TOC Progression Note (Addendum)
Transition of Care [facility]) - Progression Note    Patient Details  Name: Lucas Morales MRN: [id number] Date of Birth: [birth year]  Transition of Care [facility]) CM/SW Contact  Leeroy Cha, RN Phone Number: 07/17/2018, 9:01 AM  Clinical Narrative:    Spoke with CSw Bernette jhones. [employer] will nhot pay for snf with hospice.  Option is snf or home with hospice.  Paged via amion to R. Netty MD and call returned.  MD made aware of this.   Expected Discharge Plan: [place] Barriers to Discharge: Continued Medical Work up  Expected Discharge Plan and Services Expected Discharge Plan: [place]   Discharge Planning Services: CM Consult Post Acute Care Choice: Home Health                                         Social Determinants of Health (SDOH) Interventions    Readmission Risk Interventions No flowsheet data found.

## 2018-07-18 ENCOUNTER — Telehealth: Payer: Self-pay | Admitting: *Deleted

## 2018-07-18 LAB — GLUCOSE, CAPILLARY
Glucose-Capillary: 100 mg/dL — ABNORMAL HIGH (ref 70–99)
Glucose-Capillary: 162 mg/dL — ABNORMAL HIGH (ref 70–99)
Glucose-Capillary: 141 mg/dL — ABNORMAL HIGH (ref 70–99)
Glucose-Capillary: 183 mg/dL — ABNORMAL HIGH (ref 70–99)

## 2018-07-18 LAB — BASIC METABOLIC PANEL
Anion gap: 10 (ref 5–15)
BUN: 34 mg/dL — ABNORMAL HIGH (ref 8–23)
CO2: 24 mmol/L (ref 22–32)
Calcium: 6.8 mg/dL — ABNORMAL LOW (ref 8.9–10.3)
Chloride: 104 mmol/L (ref 98–111)
Creatinine, Ser: 0.97 mg/dL (ref 0.61–1.24)
GFR calc Af Amer: >60 mL/min (ref >60)
GFR calc non Af Amer: >60 mL/min (ref >60)
Glucose, Bld: 196 mg/dL — ABNORMAL HIGH (ref 70–99)
Potassium: 3.2 mmol/L — ABNORMAL LOW (ref 3.5–5.1)
Sodium: 138 mmol/L (ref 135–145)

## 2018-07-18 LAB — LACTATE DEHYDROGENASE: LDH: 203 U/L — ABNORMAL HIGH (ref 98–192)

## 2018-07-18 MED ORDER — FUROSEMIDE 10 MG/ML IJ SOLN
40.0000 mg | Freq: Two times a day (BID) | INTRAMUSCULAR | Status: DC
  Administered 2018-07-18 – 2018-07-19 (×2): 40 mg via INTRAVENOUS
  Filled 2018-07-18: qty 4

## 2018-07-18 MED ORDER — POTASSIUM CHLORIDE CRYS ER 20 MEQ PO TBCR
40.0000 meq | EXTENDED_RELEASE_TABLET | ORAL | Status: AC
  Administered 2018-07-18 (×2): 40 meq via ORAL
  Filled 2018-07-18 (×2): qty 2

## 2018-07-18 NOTE — Progress Notes (Signed)
PROGRESS NOTE    Lucas Morales  NGE:[id number] DOB: April 22, 1933 DOA: 06/21/2018 PCP: Deland Pretty, MD   Brief Narrative: Lucas Morales is a 85 y.o. malewith medical history significant of adenomatous polyps, alpha-1 antitrypsin deficiency carrier, anemia, anxiety, depression, osteoarthritis of the knees and back, easy bruising, chronic bronchitis, chronic lower back pain, CAD, history of frequent diarrhea due to partial colectomy, diverticulosis, episodes of diverticulitis, hemorrhoids, gastritis, hiatal hernia, GERD, esophageal stricture, esophageal dysmotility, history of post CABG paroxysmal A. fib, history of bradycardia, hyperlipidemia, hypertension, OSA not on CPAP due to intolerance, restless leg syndrome, sciatic nerve pain, type 2 diabetes, urinary frequency whohad lumbar decompression L3-4 on 05/10/2018. 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 Poor prognosis per oncology. Completed radiation therapy this morning. Plan for second opinion with regard to cancer diagnosis at [facility]. Palliative care medicine on board.  Acute renal failure Initially secondary to decreased oral intake. Given IV fluids with improvement. Started to worsen slightly with diuresis but has stabilized.  Scrotal edema Anasarca Secondary to low albumin and aggressive IV resuscitation. Started on  IV lasix with some improvement. Urology consulted. Foley placed. Associated urinary retention.  -Continue Lasix -Voiding trial -Continue B&O suppositories in addition to lidocaine  L3 lesion Initially thought to be infectious and patient started on IV antibiotics. Likely metastatic lesions, however. Antibiotics discontinued.  Essential hypertension -Continue metoprolol  Paroxysmal A. Fibrillation -Continue metoprolol and Eliquis  OSA Not on outpatient CPAP. On oxygen as needed. Currently on room air.  Acute DVT -Continue Eliquis  Diabetes mellitus, type 2 -Continue Lantus, SSI  CAD -Continue metoprolol  Macrocytic anemia Stable.  Abnormal LFTs Elevated alkaline phosphatase In setting of liver metastasis  Hyponatremia Hypomagnesemia Resolved  Hypokalemia -Potassium suppelementation  Metabolic acidosis Resolveed  Thrombocytopenia Stable.   Obesity Body mass index is 30.65 kg/m.   DVT prophylaxis: Eliquis Code Status:   Code Status: DNR Family Communication: Called daughter with no response Disposition Plan: Discharge likely in 24-48 hours pending voiding trial  Consultants:   Palliative Care Medicine   Medical Oncology   Radiation Oncology  Orthopedic Surgery  Interventional Radiology  Urology  Procedures:   CT guided biopsy (4/9)  Antimicrobials:  Vancomycin  Zosyn  Ceftriaxone  Fluconazole    Subjective: No pain today. No issues overnight.  Objective: Vitals:   07/15/18 1009 07/17/18 0623 07/17/18 2013 07/18/18 0533  BP: (!) 155/85  123/83 124/65  Pulse: 87  85 88  Resp:   18 16  Temp:   97.7 F (36.5 C) 98.4 F (36.9 C)  TempSrc:   Oral Oral  SpO2:   100% 95%  Weight:  108.3 kg    Height:        Intake/Output Summary (Last 24 hours) at 07/18/2018 0816 Last data filed at 07/18/2018 0534 Gross per 24 hour  Intake 590.8 ml  Output 2900 ml  Net -2309.2 ml   Filed Weights   07/08/18 1100 07/09/18 0800 07/17/18  0623  Weight: 119.6 kg 117.6 kg 108.3 kg    Examination:  General exam: Appears calm and comfortable Respiratory system: Clear to auscultation. Respiratory effort normal. Cardiovascular system: S1 & S2 heard, RRR. No murmurs, rubs, gallops or clicks. Gastrointestinal system: Abdomen is nondistended, soft and nontender. No organomegaly or masses felt. Normal bowel sounds heard. Central nervous system: Alert and oriented. No focal neurological deficits. Extremities:  1+ LE edema. No calf tenderness GU: scrotal/penile edema Skin: No cyanosis. No rashes Psychiatry: Judgement and insight appear normal. Mood & affect appropriate.     Data Reviewed: I have personally reviewed following labs and imaging studies  CBC: Recent Labs  Lab 07/13/18 0355 07/14/18 0400 07/15/18 0537 07/16/18 0607 07/17/18 0424  WBC 7.3 6.6 6.2 6.2 5.3  NEUTROABS 6.1 5.6 5.1 4.9 4.2  HGB 10.6* 10.4* 10.7* 10.7* 10.5*  HCT 33.4* 32.8* 33.5* 32.5* 32.8*  MCV 101.8* 100.6* 100.0 100.6* 100.6*  PLT 129* 118* 125* 127* 322*   Basic Metabolic Panel: Recent Labs  Lab 07/13/18 0355 07/14/18 0400 07/15/18 0537 07/16/18 0607 07/17/18 0424  NA 138 137 137 138 138  K 3.8 3.8 3.7 3.6 3.5  CL 109 107 106 104 103  CO2 21* 22 23 25 27   GLUCOSE 104* 112* 103* 112* 105*  BUN 23 25* 27* 28* 31*  CREATININE 0.78 0.82 0.91 0.95 1.03  CALCIUM 6.1* 6.1* 6.3* 6.5* 6.4*  MG 1.8 1.5* 1.7 1.6* 1.7  PHOS 2.8 2.9 3.0 3.0 3.2   GFR: Estimated Creatinine Clearance: 72.4 mL/min (by C-G formula based on SCr of 1.03 mg/dL). Liver Function Tests: Recent Labs  Lab 07/13/18 0355 07/14/18 0400 07/15/18 0537 07/16/18 0607 07/17/18 0424  AST 56* 54* 49* 50* 66*  ALT 100* 94* 87* 86* 105*  ALKPHOS 463* 464* 461* 502* 570*  BILITOT 0.9 0.8 0.8 1.0 0.6  PROT 5.2* 5.0* 5.0* 5.2* 5.3*  ALBUMIN 2.7* 2.4* 2.4* 2.5* 2.5*   No results for input(s): LIPASE, AMYLASE in the last 168 hours. No results for input(s): AMMONIA in the  last 168 hours. Coagulation Profile: No results for input(s): INR, PROTIME in the last 168 hours. Cardiac Enzymes: No results for input(s): CKTOTAL, CKMB, CKMBINDEX, TROPONINI in the last 168 hours. BNP (last 3 results) No results for input(s): PROBNP in the last 8760 hours. HbA1C: No results for input(s): HGBA1C in the last 72 hours. CBG: Recent Labs  Lab 07/17/18 0743 07/17/18 1135 07/17/18 1640 07/17/18 2018 07/18/18 0725  GLUCAP 91 135* 192* 117* 100*   Lipid Profile: No results for input(s): CHOL, HDL, LDLCALC, TRIG, CHOLHDL, LDLDIRECT in the last 72 hours. Thyroid Function Tests: No results for input(s): TSH, T4TOTAL, FREET4, T3FREE, THYROIDAB in the last 72 hours. Anemia Panel: No results for input(s): VITAMINB12, FOLATE, FERRITIN, TIBC, IRON, RETICCTPCT in the last 72 hours. Sepsis Labs: No results for input(s): PROCALCITON, LATICACIDVEN in the last 168 hours.  No results found for this or any previous visit (from the past 240 hour(s)).       Radiology Studies: No results found.      Scheduled Meds: . apixaban  5 mg Oral BID  . dexamethasone  4 mg Oral Daily  . fentaNYL  1 patch Transdermal Q72H  . furosemide  40 mg Intravenous BID  . insulin aspart  0-15 Units Subcutaneous TID WC  . insulin glargine  10 Units Subcutaneous QHS  .  lidocaine  1 application Topical Once  . metoprolol succinate  50 mg Oral Daily  . multivitamin with minerals  1 tablet Oral Daily  . OLANZapine  2.5 mg Oral QHS  . opium-belladonna  1 suppository Rectal Q8H  . pantoprazole  40 mg Oral BID  . sodium bicarbonate  1,300 mg Oral BID  . sodium chloride flush  10-40 mL Intracatheter Q12H  . sodium chloride flush  3 mL Intravenous Q12H  . tamsulosin  0.4 mg Oral Daily   Continuous Infusions: . sodium chloride       LOS: 27 days   >35 minutes spent with direct patient care, inaddition to documentation and discussion with patient's HCPOA with regard to ongoing medical  treatments for edema in addition to outpatient follow-up/discharge management. 33 minutes spent via telephone call.   Cordelia Poche, MD [facility] 07/18/2018, 8:16 AM  If 7PM-7AM, please contact night-coverage [email or web address]

## 2018-07-18 NOTE — Addendum Note (Signed)
Encounter addended by: Anyelo Mccue, [place] W, RN on: 07/18/2018 9:43 PM  Actions taken: MAR administration accepted

## 2018-07-18 NOTE — TOC Progression Note (Signed)
Transition of Care [facility]) - Progression Note    Patient Details  Name: Lucas Morales MRN: [id number] Date of Birth: 07-31-1933  [facility] CM/SW Contact  Leeroy Cha, RN Phone Number: 07/18/2018, 8:41 AM  Clinical Narrative:    [facility] set with advanced [facility]   Expected Discharge Plan: [place] Barriers to Discharge: No Barriers Identified  Expected Discharge Plan and Services Expected Discharge Plan: [place]   Discharge Planning Services: CM Consult Post Acute Care Choice: [place] arrangements for the past 2 months: Single Family Home                           HH Arranged: RN [facility] Agency: [place] ([place]) Date HH Agency Contacted: 07/18/18 Time [place]: 0840 Representative spoke with at [place]: [place]   Social Determinants of Health (SDOH) Interventions    Readmission Risk Interventions No flowsheet data found.

## 2018-07-18 NOTE — Telephone Encounter (Signed)
Spoke with Onc Referrals coordinator regarding pt appt.  Pt last seen in 2016, will be D/C hopefully 5/1. Records faxed to [phone] for Dr. Caleen Jobs to review. [facility] will call pt with appt date/time.

## 2018-07-18 NOTE — Progress Notes (Signed)
Patient ID: Lucas Morales, male   DOB: 09-18-1933, 85 y.o.   MRN: [id number]    Subjective: Pt to be discharged home with home health in near future.  Catheter pain still present but definitely less severe than a few days ago.  Objective: Vital signs in last 24 hours: Temp:  [97.7 F (36.5 C)-98.4 F (36.9 C)] 98.4 F (36.9 C) (04/30 0533) Pulse Rate:  [85-88] 88 (04/30 0533) Resp:  [16-18] 16 (04/30 0533) BP: (114-124)/(60-83) 124/65 (04/30 0533) SpO2:  [95 %-100 %] 95 % (04/30 0533)  Intake/Output from previous day: 04/29 0701 - 04/30 0700 In: 590.8 [P.O.:480; I.V.:16; IV Piggyback:94.8] Out: 2900 [Urine:2900] Intake/Output this shift: No intake/output data recorded.  Physical Exam:  General: Alert and oriented GU: Penoscrotal edema still present but now significantly reduced compared to Saturday.  Foreskin can easily be retracted now and glans/urethral meatus easily exposed.  Lab Results: Recent Labs    07/16/18 0607 07/17/18 0424  HGB 10.7* 10.5*  HCT 32.5* 32.8*   BMET Recent Labs    07/16/18 0607 07/17/18 0424  NA 138 138  K 3.6 3.5  CL 104 103  CO2 25 27  GLUCOSE 112* 105*  BUN 28* 31*  CREATININE 0.95 1.03  CALCIUM 6.5* 6.4*     Studies/Results: No results found.  Assessment/Plan: 1) Retention: Voiding trial today that catheter replacement would not be challenging if necessary.  Catheter removed at 8:45 AM.  Will monitor PVRs.  Continue alpha blocker.  2) Penoscrotal edema: Improving.  Continue scrotal elevation, diuresis.  3) Penile pain: Hopefully will resolve with catheter out.   LOS: 27 days   Lucas Morales 07/18/2018, 9:26 AM

## 2018-07-18 NOTE — Progress Notes (Signed)
PT Cancellation Note  Patient Details Name: Lucas Morales MRN: [id number] DOB: 17-Jul-1933   Cancelled Treatment:     attempted twice to see today.  Pt stated he hopes to D/C to home tomorrow.   Rica Koyanagi  PTA Acute  Rehabilitation Services Pager      [phone] Office      [phone]

## 2018-07-18 NOTE — Progress Notes (Signed)
Pt. voided 200 cc. urine, post void residual was 173 cc by bladder scanner

## 2018-07-19 LAB — BASIC METABOLIC PANEL
Anion gap: 9 (ref 5–15)
BUN: 33 mg/dL — ABNORMAL HIGH (ref 8–23)
CO2: 25 mmol/L (ref 22–32)
Calcium: 6.8 mg/dL — ABNORMAL LOW (ref 8.9–10.3)
Chloride: 105 mmol/L (ref 98–111)
Creatinine, Ser: 1.03 mg/dL (ref 0.61–1.24)
GFR calc Af Amer: >60 mL/min (ref >60)
GFR calc non Af Amer: >60 mL/min (ref >60)
Glucose, Bld: 110 mg/dL — ABNORMAL HIGH (ref 70–99)
Potassium: 4.3 mmol/L (ref 3.5–5.1)
Sodium: 139 mmol/L (ref 135–145)

## 2018-07-19 LAB — GLUCOSE, CAPILLARY
Glucose-Capillary: 83 mg/dL (ref 70–99)
Glucose-Capillary: 93 mg/dL (ref 70–99)
Glucose-Capillary: 100 mg/dL — ABNORMAL HIGH (ref 70–99)

## 2018-07-19 MED ORDER — ONDANSETRON HCL 4 MG PO TABS: 4.0000 mg | ORAL_TABLET | Freq: Four times a day (QID) | ORAL | 0 refills | Status: AC | PRN

## 2018-07-19 MED ORDER — FUROSEMIDE 40 MG PO TABS: 40.0000 mg | ORAL_TABLET | Freq: Every day | ORAL | 0 refills | Status: DC

## 2018-07-19 MED ORDER — APIXABAN 5 MG PO TABS: 5.0000 mg | ORAL_TABLET | Freq: Two times a day (BID) | ORAL | 0 refills | Status: DC

## 2018-07-19 MED ORDER — SENNOSIDES-DOCUSATE SODIUM 8.6-50 MG PO TABS: 1.0000 | ORAL_TABLET | Freq: Two times a day (BID) | ORAL | 0 refills | Status: DC

## 2018-07-19 MED ORDER — DEXAMETHASONE 4 MG PO TABS: 4.0000 mg | ORAL_TABLET | Freq: Every day | ORAL | 0 refills | Status: DC

## 2018-07-19 MED ORDER — TAMSULOSIN HCL 0.4 MG PO CAPS: 0.4000 mg | ORAL_CAPSULE | Freq: Every day | ORAL | 0 refills | Status: DC

## 2018-07-19 MED ORDER — HYDROMORPHONE HCL 8 MG PO TABS: 8.0000 mg | ORAL_TABLET | Freq: Four times a day (QID) | ORAL | 0 refills | Status: AC | PRN

## 2018-07-19 MED ORDER — INSULIN GLARGINE 100 UNIT/ML SOLOSTAR PEN: 10.0000 [IU] | PEN_INJECTOR | Freq: Every day | SUBCUTANEOUS | 0 refills | Status: AC

## 2018-07-19 MED ORDER — BLOOD GLUCOSE METER KIT: PACK | 0 refills | Status: AC

## 2018-07-19 MED ORDER — FENTANYL 50 MCG/HR TD PT72: 1.0000 | MEDICATED_PATCH | TRANSDERMAL | 0 refills | Status: AC

## 2018-07-19 MED ORDER — OLANZAPINE 2.5 MG PO TABS: 2.5000 mg | ORAL_TABLET | Freq: Every day | ORAL | 0 refills | Status: DC

## 2018-07-19 NOTE — Discharge Summary (Signed)
Physician Discharge Summary  ELIYOHU CLASS YQI:[id number] DOB: 1933-05-28 DOA: 06/21/2018  PCP: Deland Pretty, MD  Admit date: 06/21/2018 Discharge date: 07/19/2018  Admitted From: Home Disposition: Home  Recommendations for Outpatient Follow-up:  1. Follow up with PCP in 1 week 2. Follow up with [facility] in 1 week for a second opinion 3. Follow with Dr. Alinda Money (Urology) as needed 4. Please obtain BMP/CBC in one week 5. Please follow up on the following pending results: None  Home Health: PT, OT, RN, Aide Equipment/Devices: None  Discharge Condition: Stable CODE STATUS: DNR Diet recommendation: Regular diet   Brief/Interim Summary:  Admission HPI written by [name], MD   Chief Complaint:  HPI: 85 year old male lives alone is admitted for suspected postoperative lumbar decompression at L3-4 infection.  Surgery date was 04/2122 severe stenosis at L3-4, multifactorial by MRI on 03/31/2018 and neurogenic claudication symptoms.  Patient underwent surgery postoperatively had no problems with fever or wound drainage.  He had persistent problems with pain and had been taking pain medication for several weeks prior to his surgical decompression.  Operative findings showed severe stenosis and postoperatively had improvement in his leg symptoms.  The last 3 weeks patient's had problems with weight loss poor p.o. intake.  Postop MRI scan was obtained that showed fluid collection multiloculated and area and L3 vertebral body suggestive of osteomyelitis.  MRI scan 03/31/2018 showed no abnormality of the L3 vertebral body.  Patient had recent pain in his penis without dysuria or urinary frequency.  Additionally patient has history of coronary artery disease, diabetes type 2, depression.  History of chronic interstitial cystitis.  Previous left hip fracture dyslipidemia, post cervical fusion.  Patient seen 06/04/2018 with back pain that radiated to his coccyx.  His pain is increased as his narcotic  pain medication had been decreased.  He had past history of difficulty getting off narcotic medication in the distant past.  MRI scan was ordered obtained and PICC line is been placed earlier today for IV antibiotics and interventional radiology performed aspiration with report showing serous bloody fluid.    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 Poor prognosis per oncology. Completed 10 treatments of radiation therapy. Plan for second opinion with regard to cancer diagnosis at [facility]. Palliative care medicine on board.  Acute renal failure Initially secondary to decreased oral intake. Given IV fluids with improvement. Started to worsen slightly with diuresis but has stabilized.  Scrotal edema Anasarca Secondary to low albumin and aggressive IV resuscitation. Started on IV lasix with some improvement. Urology consulted. Foley placed. Associated urinary retention. With diuresis, symptoms have improved. Foley removed on 4/30 and patient was successful with voiding trial.  L3 lesion Initially thought to be infectious and patient started on IV antibiotics. Likely metastatic lesions, however. Antibiotics discontinued.  Essential hypertension Continue metoprolol  Paroxysmal A. Fibrillation Continue metoprolol and now on Eliquis  OSA Not on outpatient CPAP. On oxygen as needed. Currently on room air.  Acute DVT Right soleal vein. Seen on vascular ultrasound. Started on Eliquis.  Diabetes mellitus, type 2 Lantus 10 units daily on discharge.  CAD Continue metoprolol  Macrocytic anemia Stable.  Abnormal LFTs Elevated alkaline phosphatase In setting of liver metastasis  Hyponatremia Hypomagnesemia Resolved  Hypokalemia Potassium suppelementation  Metabolic acidosis Resolveed  Thrombocytopenia Stable.  Obesity Body mass index is 30.65 kg/m.  Discharge Diagnoses:    Principal Problem:   Pelvic mass Active  Problems:   Essential hypertension   Dyslipidemia   PAF (paroxysmal atrial fibrillation) (HCC)   OSA (obstructive sleep apnea)   Deep venous thrombosis (HCC)   Cancer associated pain   DNR (do not resuscitate)   Type II diabetes mellitus ([place])   Coronary artery disease   AKI (acute kidney injury) ([place])   Metastatic cancer (HCC)   Lung nodules   Bladder spasm    Discharge Instructions  Discharge Instructions    Diet - low sodium heart healthy   Complete by:  As directed    Increase activity slowly   Complete by:  As directed      Allergies as of 07/19/2018      Reactions   Morphine Itching      Medication List    STOP taking these medications   aspirin EC 81 MG tablet   oxyCODONE-acetaminophen 5-325 MG tablet Commonly known as:  PERCOCET/ROXICET   predniSONE 5 MG (21) Tbpk tablet Commonly known as:  STERAPRED UNI-PAK 21 TAB     TAKE these medications   allopurinol 300 MG tablet Commonly known as:  ZYLOPRIM Take 300 mg by mouth daily.   apixaban 5 MG Tabs tablet Commonly known as:  ELIQUIS Take 1 tablet (5 mg total) by mouth 2 (two) times daily.   APPLE CIDER VINEGAR PO Take 1 capsule by mouth 2 (two) times daily.   atorvastatin 40 MG tablet Commonly known as:  LIPITOR Take 40 mg by mouth at bedtime.   blood glucose meter kit and supplies Dispense based on patient and insurance preference. Use up to four times daily as directed. (FOR ICD-10 E10.9, E11.9).   dexamethasone 4 MG tablet Commonly known as:  DECADRON Take 1 tablet (4 mg total) by mouth daily.   fentaNYL 50 MCG/HR Commonly known as:  [place] 1 patch onto the skin every 3 (three) days. Start taking on:  Jul 21, 2018   FIBER PO Take 3 capsules by mouth daily as needed (to bulk up stool).   hydrocortisone cream 1 % Apply 1 application topically at bedtime as needed for itching (rash).   HYDROmorphone 8 MG tablet Commonly known as:   DILAUDID Take 1 tablet (8 mg total) by mouth every 6 (six) hours as needed for moderate pain.   Insulin Glargine 100 UNIT/ML Solostar Pen Commonly known as:  LANTUS Inject 10 Units into the skin daily.   loperamide 2 MG tablet Commonly known as:  IMODIUM A-D Take 6 mg by mouth daily as needed for diarrhea or loose stools.   metoprolol succinate 50 MG 24 hr tablet Commonly known as:  TOPROL-XL Take 1 tablet (50 mg total) by mouth daily. What changed:  when to take this   OLANZapine 2.5 MG tablet Commonly known as:  ZYPREXA Take 1 tablet (2.5 mg total) by mouth at bedtime.   ondansetron 4 MG tablet Commonly known as:  ZOFRAN Take 1 tablet (4 mg total) by mouth every 6 (six) hours as needed for nausea.   OVER THE COUNTER MEDICATION Apply 1 application topically daily as needed (hip pain). CBD cream and/or tincture   pantoprazole 40 MG tablet Commonly known as:  Protonix Take 1 tablet (40 mg total) by mouth 2 (two) times daily.   tamsulosin 0.4 MG Caps capsule Commonly known as:  FLOMAX Take 1 capsule (0.4 mg total) by mouth daily.   tolnaftate 1 % cream Commonly known as:  TINACTIN Apply 1 application topically 3 (three) times daily as needed (itching/rash).  Tums Ultra 1000 400 MG chewable tablet Generic drug:  calcium elemental as carbonate Chew 4,000 mg by mouth daily.            Durable Medical Equipment  (From admission, onward)         Start     Ordered   07/03/18 1119  For home use only DME 3 n 1  Once     07/03/18 1118   07/03/18 1119  For home use only DME Hospital bed  Once    Comments:  Daughter Shelden Raborn [phone]  Question Answer Comment  Patient has (list medical condition): right pelvic fracture , malignancy   The above medical condition requires: Patient requires the ability to reposition frequently   Head must be elevated greater than: 45 degrees   Bed type Semi-electric   Support Surface: Gel Overlay      07/03/18 1118    07/03/18 1119  For home use only DME lightweight manual wheelchair with seat cushion  Once    Comments:  Patient suffers from right pelvic fracture  which impairs their ability to perform daily activities like ambulating in the home.  A cane will not resolve  issue with performing activities of daily living. A wheelchair will allow patient to safely perform daily activities. Patient is not able to propel themselves in the home using a standard weight wheelchair due to right pelvic . Patient can self propel in the lightweight wheelchair.  Accessories: elevating leg rests (ELRs), wheel locks, extensions and anti-tippers.   07/03/18 1118   07/03/18 1119  For home use only DME standard manual wheelchair with seat cushion  Once    Comments:  Patient suffers from right pelvic fracture  Malignancy which impairs their ability to perform daily activities like ambulating  in the home.  A cane  will not resolve  issue with performing activities of daily living. A wheelchair will allow patient to safely perform daily activities. Patient can safely propel the wheelchair in the home or has a caregiver who can provide assistance.  Accessories: elevating leg rests (ELRs), wheel locks, extensions and anti-tippers.   07/03/18 [place] Follow up.   Contact information: [place] [facility] [place] [address] [phone]        Raynelle Bring, MD Follow up.   Specialty:  Urology Why:  As needed Contact information: [place] [address] [phone]        Deland Pretty, MD. Schedule an appointment as soon as possible for a visit in 1 week(s).   Specialty:  Internal Medicine Contact information: [address] [place] [address] [phone]          Allergies  Allergen Reactions   Morphine Itching    Consultations:  [facility]   Radiation  Oncology  Orthopedic Surgery  Interventional Radiology  Urology   Procedures/Studies: Dg Pelvis 1-2 Views  Result Date: 06/26/2018 CLINICAL DATA:  Pelvic fracture. EXAM: PELVIS - 1-2 VIEW COMPARISON:  MRI pelvis 06/25/2018. Nuclear medicine bone scan 06/25/2018. CT abdomen and pelvis 06/24/2018. FINDINGS: There is heterogeneously increased density in the right ischium with superimposed curvilinear lucency corresponding to the pathologic fracture shown on the prior studies without significant displacement evident on these AP radiographs. The femoral heads are approximated with the acetabula on these AP radiographs. A remote, healed left femur fracture  is again noted with partial visualization of an intramedullary nail and hip screw. IMPRESSION: Known pathologic fracture of the right ischium. Electronically Signed   By: Logan Bores M.D.   On: 06/26/2018 20:46   Ct Chest W Contrast  Result Date: 06/26/2018 CLINICAL DATA:  Osseous lesions involving the L3 vertebral body, right acetabulum and ischium suspicious for metastatic disease. Ill-defined right hepatic lesion. Assess for primary lung cancer. EXAM: CT CHEST WITH CONTRAST TECHNIQUE: Multidetector CT imaging of the chest was performed during intravenous contrast administration. CONTRAST:  41m OMNIPAQUE IOHEXOL 300 MG/ML  SOLN COMPARISON:  Abdominopelvic CT 06/24/2018, abdominal MRI 06/25/2018, cardiac CT 12/17/2008 and bone scan 06/25/2018. FINDINGS: Cardiovascular: Contrast bolus is suboptimal. There is atherosclerosis of the aorta, great vessels and coronary arteries status post median sternotomy and CABG. Right arm PICC extends to the superior cavoatrial junction. There is stable mild cardiomegaly. No significant pericardial effusion. Mediastinum/Nodes: There are no enlarged mediastinal, hilar or axillary lymph nodes.Moderate to large hiatal hernia again noted. The trachea and thyroid gland appear unremarkable. Lungs/Pleura: There is no pleural  effusion or pneumothorax. There is mild chronic lung disease with central airway thickening and subpleural reticulation. There are multiple noncalcified pulmonary nodules which appear new compared with the remote cardiac CT (the lungs are incompletely visualized at that time). The largest nodule is located posteriorly in the right upper lobe along the superior aspect of the major fissure, measuring 18 x 13 mm on image 81/4. Other nodules include a 12 x 7 mm right upper lobe nodule (image 66/4), an 8 x 7 mm right lower lobe nodule (image 120/4) and a 9 mm left upper lobe nodule (image 41/4). There is a calcified right upper lobe granuloma. Upper abdomen: As seen on recent CT and MRI, there is a peripherally enhancing mass centrally in the right hepatic lobe measuring up to 5.4 x 4.4 cm on image 138/3. Adjacent satellite lesions, IVC extension of tumor and intrahepatic biliary dilatation posteriorly in the right hepatic lobe are again noted. Musculoskeletal/Chest wall: There is no chest wall mass or suspicious osseous finding. Previous median sternotomy and lower cervical fusion. There are loose bodies within the right shoulder joint. IMPRESSION: 1. There are multiple pulmonary nodules bilaterally, including a dominant irregular right upper lobe lesion which could reflect primary lung cancer. The other nodules appear new from remote cardiac CT, suspicious for metastatic disease. No thoracic adenopathy. 2. Moderate to large hiatal hernia. 3. Central mass in the right hepatic lobe with associated biliary dilatation again noted as seen on recent abdominal studies. 4. Based on the patient's prior studies, it remains uncertain as to what the primary malignancy is in this case. 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 Electronically Signed   By: WRichardean SaleM.D.   On: 06/26/2018 09:18   Mr BJeri CosWBJ Contrast  Result Date: 07/03/2018 CLINICAL DATA:  Neuroendocrine tumors of unknown primary, biopsy-proven, initial follow-up. Pelvic mass. EXAM: MRI HEAD WITHOUT AND WITH CONTRAST TECHNIQUE: Multiplanar, multiecho pulse sequences of the brain and surrounding structures were obtained without and with intravenous contrast. CONTRAST:  10 mL Gadavist COMPARISON:  None FINDINGS: Brain: No acute infarct, hemorrhage, or mass lesion is present. Postcontrast images demonstrate no pathologic enhancement. There is no evidence for metastatic disease to the brain. Mild atrophy is within normal limits for age. There is no significant white matter disease. The ventricles are of proportionate to the degree of atrophy.  No significant extraaxial fluid collection is present. The internal auditory canals are within normal limits bilaterally. The brainstem and cerebellum are within normal limits. Vascular: Flow is present in the major intracranial arteries. Skull and upper cervical spine: The craniocervical junction is normal. Upper cervical spine is within normal limits. Marrow signal is unremarkable. Sinuses/Orbits: The paranasal sinuses and mastoid air cells are clear. Bilateral lens replacements are noted. Globes and orbits are otherwise unremarkable. IMPRESSION: 1. No evidence for metastatic disease to the brain or meninges. 2. Normal MRI appearance of the brain for age. Electronically Signed   By: San Morelle M.D.   On: 07/03/2018 11:36   Mr Lumbar Spine W/o Contrast  Addendum Date: 06/21/2018   ADDENDUM REPORT: 06/21/2018 13:25 ADDENDUM: The new abnormal signal in the posterior L3 vertebral body could also represent a small infarct. Correlation with clinical symptoms and inflammatory markers is still recommended to exclude osteomyelitis. Electronically Signed   By: Titus Dubin M.D.   On: 06/21/2018 13:25   Result Date: 06/21/2018 CLINICAL DATA:  Low back pain radiating to the right buttock and leg to the knee  for the past 3 months. Bilateral leg numbness and weakness. Recent surgery in February. EXAM: MRI LUMBAR SPINE WITHOUT CONTRAST TECHNIQUE: Multiplanar, multisequence MR imaging of the lumbar spine was performed. No intravenous contrast was administered. COMPARISON:  MRI lumbar spine dated March 31, 2018. FINDINGS: Segmentation:  Unchanged partial sacralization of L5 on the left. Alignment:  Unchanged 4 mm anterolisthesis at L4-L5. Vertebrae: No fracture. New focal, rounded 2.3 cm area of slightly T2 hypointense, T1 hypointense, slightly stir hyperintense marrow signal in the posterior L3 vertebral body with a curvilinear rim of T2/STIR hyperintensity. Conus medullaris and cauda equina: Conus extends to the T12-L1 level. Conus and cauda equina appear normal. Paraspinal and other soft tissues: Paraspinous postsurgical changes centered at L3-L4 with loculated 2.8 x 1.4 x 7.9 cm fluid collection. No epidural fluid collection. Disc levels: T12-L1: Unchanged mild disc bulging and mild bilateral facet arthropathy. Unchanged mild right neuroforaminal stenosis. No spinal canal or left neuroforaminal stenosis. L1-L2: Unchanged small shallow broad-based posterior disc protrusion and mild bilateral facet arthropathy with ligamentum flavum hypertrophy resulting and moderate spinal canal and mild bilateral neuroforaminal stenosis. L2-L3: Prior posterior decompression. Unchanged small shallow broad-based posterior disc protrusion and mild bilateral facet arthropathy. Unchanged moderate bilateral lateral recess and neuroforaminal stenosis. No spinal canal stenosis. L3-L4: Prior posterior decompression with interval bilateral partial facetectomy. Unchanged small broad-based posterior disc protrusion and severe bilateral facet arthropathy. Resolved spinal canal stenosis. Residual mild bilateral lateral recess stenosis and moderate bilateral neuroforaminal stenosis. L4-L5: Unchanged mild disc uncovering and disc bulging with prior  bilateral facet joint ankylosis. Unchanged mild bilateral lateral recess and neuroforaminal stenosis. No spinal canal stenosis. L5-S1: Negative disc. Unchanged moderate right and mild left facet arthropathy. No stenosis. IMPRESSION: 1. New focal, rounded 2.3 cm area of abnormal signal in the posterior L3 vertebral body, most concerning for osteomyelitis given recent surgery. The appearance could be seen with a metastatic lesion such as prostate cancer, although this is thought to be less likely given the normal marrow signal 3 months ago. 2. Interval repeat posterior decompression with new bilateral partial facetectomies at L3-L4. Resolved spinal canal stenosis. There is residual mild bilateral lateral recess stenosis and moderate bilateral neuroforaminal stenosis. 3. Unchanged moderate spinal canal stenosis at L1-L2. 4. Prior remote posterior decompression at L2-L3 with unchanged moderate bilateral lateral recess and neuroforaminal stenosis. 5. Paraspinous postsurgical changes centered at L3-L4 with  loculated 2.8 x 1.4 x 7.9 cm fluid collection, likely a seroma. These results will be called to the ordering clinician or representative by the Radiologist Assistant, and communication documented in the PACS or zVision Dashboard. Electronically Signed: By: Titus Dubin M.D. On: 06/21/2018 10:10   Mr Pelvis W Wo Contrast  Result Date: 06/25/2018 CLINICAL DATA:  Soft tissue mass around the right ischium. EXAM: MRI PELVIS WITHOUT AND WITH CONTRAST TECHNIQUE: Multiplanar multisequence MR imaging of the pelvis was performed both before and after administration of intravenous contrast. CONTRAST:  10 cc Gadavist COMPARISON:  CT scan of the abdomen and pelvis dated 06/24/2018 and lumbar MRI dated 06/20/2018 FINDINGS: Musculoskeletal: There is a 13 x 10 x 10 cm mass which appears to arise from the right ischium extending into the adjacent soft tissues. The tumor extends into the posterior aspect of the right acetabulum.  There is a pathologic fracture at the right ischial tuberosity. The prior CT scan demonstrates scattered small calcifications within this mass. The mass extends into the right adductor muscles and into the gluteal muscles. The right sciatic nerve should be affected. The right internal obturator muscle is affected. The mass extends toward the rectum and prostate gland. There is a 9 mm metastatic lesion in the left sacral ala as well as a 16 mm metastatic lesion in the right iliac crest. There is circumferential enhancement of these bone lesions similar to the appearance of the lesion in the L3 vertebral body on the MRI of the lumbar spine dated 06/20/2018. There is marked edema in the muscles of the right buttock and proximal thigh as well as edema in the retroperitoneal areas of the pelvis and in the left buttock. 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 Urinary Tract: Foley catheter in place. Small amount of air in the decompressed bladder. Bowel: Diverticulosis of the distal descending and proximal sigmoid portions of the colon. Vascular/Lymphatic: Distention of the femoral and iliac veins consistent with proximal IVC obstruction. Reproductive:  No mass or other significant abnormality Other: There is subcutaneous edema in the pelvis, most prominent on the right. IMPRESSION: 1. 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 This hip mass would be the most accessible lesion for percutaneous needle biopsy for diagnosis, if necessary. 2. Pathologic fracture of the right ischium. 3. Metastatic lesions in the sacrum and right ilium. 4. 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 Electronically Signed   By: Lorriane Shire M.D.   On: 06/25/2018 13:54   Nm Bone Scan Whole Body  Result Date: 06/25/2018 CLINICAL DATA:  Suspected postoperative (lumbar decompression at L3-4) infection. Surgery date was 05/10/2018. 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 Operative findings showed severe stenosis and postoperatively had improvement in his leg symptoms. Postop MRI scan was obtained that showed fluid collection multiloculated and area and L3 vertebral body suggestive of osteomyelitis. MRI scan 03/31/2018 showed no abnormality of the L3 vertebral body. Additionally patient has history of coronary artery disease, diabetes type 2, depression. History of chronic interstitial cystitis. Previous left hip fracture dyslipidemia, post cervical fusion and back pain that radiates to  his coccyx EXAM: NUCLEAR MEDICINE WHOLE BODY BONE SCAN TECHNIQUE: Whole body anterior and posterior images were obtained approximately 3 hours after intravenous injection of radiopharmaceutical. RADIOPHARMACEUTICALS:  22.0 mCi Technetium-74mMDP IV COMPARISON:  CT, 06/24/2018. FINDINGS: Right hip and inferior ischium uptake, most evident posteriorly, corresponding to the fractures and surrounding 12 cm soft tissue mass noted on the recent prior CT. There is focal uptake in the proximal right humeral shaft. Degenerative type uptake is noted throughout the spine and at the sternoclavicular joints. There is uptake surrounding bilateral knee prostheses, greater on the right, consistent with reactive uptake. There is uptake noted at the right elbow, incompletely imaged. Renal uptake is symmetric. IMPRESSION: 1. Right hip and ischium uptake. This corresponds to fractures noted on the recent CT, where there was a surrounding soft tissue mass. There is also focal uptake in the proximal right humeral shaft. This additional  area of uptake is concerning for metastatic disease. 2. No other evidence of metastatic disease. 3. There are other areas of degenerative and reactive uptake as detailed. Electronically Signed   By: DLajean ManesM.D.   On: 06/25/2018 14:38   Ct Abdomen Pelvis W Contrast  Result Date: 06/24/2018 CLINICAL DATA:  New L3 lesion on lumbar MRI, suspicious for metastatic disease. No known malignancy. EXAM: CT ABDOMEN AND PELVIS WITH CONTRAST TECHNIQUE: Multidetector CT imaging of the abdomen and pelvis was performed using the standard protocol following bolus administration of intravenous contrast. CONTRAST:  105mOMNIPAQUE IOHEXOL 300 MG/ML  SOLN COMPARISON:  Abdominopelvic CT 04/30/2009.  Lumbar MRI 06/20/2018. FINDINGS: Lower chest: Trace right pleural effusion with mild bibasilar atelectasis. There is a moderate size hiatal hernia which has mildly enlarged compared with the prior CT. Aortic and coronary artery atherosclerosis noted. Hepatobiliary: There are no underlying morphologic changes of cirrhosis. There is new volume loss in the posterior segment of the right hepatic lobe with associated intrahepatic biliary dilatation. There is an ill-defined low-density mass centrally in the right hepatic lobe, measuring approximately 3.5 x 2.6 cm on image 16/3. There are possible additional lesions in the right hepatic lobe measuring 15 mm on image 16/3 and 25/3. Low-density anteriorly near the falciform ligament on image 29/3 likely represents focal fat. No significant extrahepatic biliary dilatation post cholecystectomy. Pancreas: Unremarkable. No pancreatic ductal dilatation or surrounding inflammatory changes. Spleen: Normal in size without focal abnormality. Adrenals/Urinary Tract: Both adrenal glands appear normal. No focal renal mass or hydronephrosis identified. There is perinephric soft tissue stranding bilaterally which has mildly progressed compared with the remote CT. The bladder is decompressed by a Foley  catheter. There is some air in the bladder lumen. Stomach/Bowel: No evidence of bowel wall thickening, distention or surrounding inflammatory change. Postsurgical changes in the right colon status post apparent ileocolonic anastomosis. There are sigmoid colon diverticular changes. Vascular/Lymphatic: There are no enlarged abdominal or pelvic lymph nodes. Mild aortic and branch vessel atherosclerosis. There is extrinsic mass effect on the intrahepatic portions of the IVC and right portal vein by the process in the right hepatic lobe. No large vessel occlusion identified. Reproductive: The prostate gland and seminal vesicles appear normal. Other: Soft tissue stranding throughout the perinephric and pelvic fat with a small amount of ascites. No focal fluid collection. Musculoskeletal: Status post left proximal femoral ORIF. There is a nondisplaced, mildly comminuted fracture of the right acetabulum and ischium, best seen on the reformatted images. There is a surrounding heterogeneous soft tissue mass which measures up to 11.5 x 9.7 cm on image 90/3.  Some of this may reflect hematoma, although is suspicious for neoplasm. There is a component extending medially along the pelvic sidewall. The lesion involving the posterosuperior aspect of the L3 vertebral body on MRI is not well visualized. There is a probable small lytic lesion in the right inferior pubic ramus, best seen on coronal image 88/6. There are postsurgical changes in the lower lumbar spine. IMPRESSION: 1. Although the L3 lesion seen on MRI is not well seen by CT, there is an apparent partially calcified soft tissue mass associated with the right acetabulum and ischium with an underlying pathologic fracture. Some of the soft tissue component could be due to hematoma, although the appearance is suspicious for underlying neoplasm, possibly metastatic disease or myeloma. 2. In addition, there is suspicion of an ill-defined, mass centrally in the right hepatic lobe  with associated volume loss and intrahepatic biliary dilatation. There are possible additional lesions as well. The liver findings may also reflect metastatic disease or cholangiocarcinoma. No morphologic changes of cirrhosis. 3. Tissue sampling recommended, likely easiest at the ischial soft tissue mass. Electronically Signed   By: Richardean Sale M.D.   On: 06/24/2018 15:04   Mr Liver W Wo Contrast  Result Date: 06/25/2018 CLINICAL DATA:  Right pelvic mass and liver lesions on recent CT scan. EXAM: MRI ABDOMEN WITHOUT AND WITH CONTRAST TECHNIQUE: Multiplanar multisequence MR imaging of the abdomen was performed both before and after the administration of intravenous contrast. CONTRAST:  10 cc Gadavist COMPARISON:  CT scan 06/24/2018 FINDINGS: Lower chest: Large hiatal hernia. Hepatobiliary: Infiltrating heterogeneously enhancing mass lesion is identified in the medial right liver, mainly involving segment VII with some extension into the posterior aspect of segment VIII. The lesion measures approximately 6.2 x 5.0 x 6.0 cm and invades the intrahepatic IVC. Tumor in the IVC markedly narrows and probably obliterates the lumen of the intrahepatic segment of the IVC. Thrombus is identified in the left renal vein tracking into the infra hepatic IVC up to the level of the tumor invasion (see coronal postcontrast images 36-45 of series 26). Intrahepatic biliary duct dilatation is identified in the posterior right liver with abrupt cut off of the dilated ducts at the periphery of the mass. No evidence for left hepatic biliary dilatation. Portal vein in the posterior right hepatic lobe is attenuated, but no thrombus evident in the main portal vein. Multiple hypervascular satellite lesions are highly suspicious for metastases. 3 of these lesions are identified in the right hepatic lobe and are well demonstrated on early postcontrast axial subtraction imaging (see images 22, 23, and 36 of series 19). Gallbladder  surgically absent. Extrahepatic common duct is nondilated at 6 mm diameter. Common bile duct in the head of the pancreas is 9 dilated at 6 mm diameter. Pancreas: No focal mass lesion. No dilatation of the main duct. No intraparenchymal cyst. No peripancreatic edema. Spleen:  No splenomegaly. No focal mass lesion. Adrenals/Urinary Tract: No adrenal nodule or mass. No hydronephrosis. No suspicious enhancing lesion in either kidney. Stomach/Bowel: Large hiatal hernia. Duodenum is normally positioned as is the ligament of Treitz. No small bowel or colonic dilatation within the visualized abdomen. Vascular/Lymphatic: Left renal vein and IVC thrombus evident and described above in the hepatobiliary section of the report. There is no gastrohepatic or hepatoduodenal ligament lymphadenopathy. No intraperitoneal or retroperitoneal lymphadenopathy. Other: Extensive edema is identified around the right liver, IVC, and in the retroperitoneal space bilaterally. Musculoskeletal: Focal enhancement identified in the posterior aspect of a lumbar vertebral body, probably  L3. This is in close proximity to the site of recent surgery. IMPRESSION: 1. 6 cm heterogeneously enhancing mass in the medial right liver obliterates intrahepatic bile ducts of the posterior right liver. Tumor invades the IVC, markedly narrowing and probably obliterating the lumen. IVC thrombus is identified in the infra hepatic segment extending into the left renal vein, a finding that is new since the CT scan of yesterday. Intrahepatic cholangiocarcinoma would be a distinct consideration. Hepatocellular carcinoma and, less commonly, metastatic disease can result in IVC invasion. 2. Hypervascular satellite lesions around the dominant tumor are compatible with metastatic involvement. 3. Fairly extensive edema identified around the IVC and in the retroperitoneal space bilaterally. 4. Hypervascular lumbar spine lesion corresponds to the focus of abnormal marrow signal  seen at L3 on recent dedicated lumbar spine MRI. These results will be called to the ordering clinician or representative by the Radiologist Assistant, and communication documented in the PACS or zVision Dashboard. Electronically Signed   By: Misty Stanley M.D.   On: 06/25/2018 16:11   US Renal  Result Date: 06/28/2018 CLINICAL DATA:  Acute kidney injury. EXAM: RENAL / URINARY TRACT ULTRASOUND COMPLETE COMPARISON:  Abdominal MRI 06/25/2018. CT abdomen and pelvis 06/24/2018. FINDINGS: Right Kidney: Renal measurements: 12.1 x 5.4 x 5.3 cm = volume: 180 mL . Echogenicity within normal limits. No mass or hydronephrosis visualized. Left Kidney: Renal measurements: 13.0 x 5.5 x 5.5 cm = volume: 206 mL. Echogenicity within normal limits. No mass or hydronephrosis visualized. Bladder: Decompressed by Foley catheter. IMPRESSION: Negative renal ultrasound. Electronically Signed   By: Logan Bores M.D.   On: 06/28/2018 09:29   Ir US Guide Bx Asp/drain  Result Date: 06/21/2018 INDICATION: Indeterminate fluid collection within the midline of the low back. Please perform ultrasound-guided aspiration for diagnostic purposes. Poor venous access. Please perform PICC line placement for long-term antibiotic administration. EXAM: 1. ULTRASOUND AND FLUOROSCOPIC GUIDED PICC LINE INSERTION 2. ULTRASOUND GUIDED ASPIRATION ILL-DEFINED FLUID COLLECTION WITHIN MIDLINE OF LOW BACK COMPARISON:  Lumbar spine MRI - 06/20/2018 MEDICATIONS: None. Anesthesia/sedation: Moderate (conscious) sedation was employed during this procedure. A total of Fentanyl 50 mcg and Dilaudid 1 mg was administered intravenously. Moderate Sedation Time: 33 minutes. The patient's level of consciousness and vital signs were monitored continuously by radiology nursing throughout the procedure under my direct supervision. CONTRAST:  None FLUOROSCOPY TIME:  36 seconds (2.3 mGy) COMPLICATIONS: None immediate. TECHNIQUE: The procedure, risks, benefits, and alternatives  were explained to the patient and informed written consent was obtained. A timeout was performed prior to the initiation of the procedure. The right upper extremity was prepped with chlorhexidine in a sterile fashion, and a sterile drape was applied covering the operative field. Maximum barrier sterile technique with sterile gowns and gloves were used for the procedure. A timeout was performed prior to the initiation of the procedure. Local anesthesia was provided with 1% lidocaine. Under direct ultrasound guidance, the basilic vein was accessed with a micropuncture kit after the overlying soft tissues were anesthetized with 1% lidocaine. After the overlying soft tissues were anesthetized, a small venotomy incision was created and a micropuncture kit was utilized to access the right basilic vein. Real-time ultrasound guidance was utilized for vascular access including the acquisition of a permanent ultrasound image documenting patency of the accessed vessel. A guidewire was advanced to the level of the superior caval-atrial junction for measurement purposes and the PICC line was cut to length. A peel-away sheath was placed and a 40 cm, 5 [place], dual  lumen was inserted to level of the superior caval-atrial junction. A post procedure spot fluoroscopic was obtained. The catheter easily aspirated and flushed and was sutured in place. A dressing was placed. _________________________________________________________ Attention was now paid towards aspiration of the ill-defined fluid collection within the midline of the low back demonstrated on preceding lumbar spine MRI. Sonographic evaluation of the soft tissues subjacent to the well-healed surgical incision within the midline of the low back demonstrated an ill-defined fluid collection correlating with ill-defined fluid collection demonstrated on preceding lumbar spine MRI As such, the overlying soft tissues were prepped and draped in usual sterile fashion. After the  overlying soft tissues were anesthetized with 1% lidocaine, an 18 gauge trocar needle was advanced into the dominant ill-defined component of the collection. An ultrasound image was saved for procedural documentation purposes Next, approximately 2 cc of serous, slightly blood tinged fluid was aspirated as the needle was slowly retracted. All aspirated fluid was capped and sent to the laboratory for analysis. A dressing was placed. The patient tolerated the above procedures well without immediate postprocedural complication. FINDINGS: After catheter placement, the tip lies within the superior cavoatrial junction. The catheter aspirates and flushes normally and is ready for immediate use. Technically successful ultrasound-guided aspiration of 2 cc of serous, slightly blood tinged fluid from the ill-defined serpiginous fluid collection within the midline of the low back, subjacent to the well-healed midline surgical incision, correlating with the ill-defined fluid collection demonstrated on preceding lumbar spine MRI. IMPRESSION: 1. Successful ultrasound and fluoroscopic guided placement of a right basilic vein approach, 40 cm, 5 [place], dual lumen PICC with tip at the superior caval-atrial junction. The PICC line is ready for immediate use. 2. Successful ultrasound-guided aspiration of approximately 2 cc of serous, slightly blood tinged fluid from the ill-defined fluid collection within the midline of the low back. All aspirated fluid was capped and sent to the laboratory for analysis. Note, the fluid collection is currently too small to allow for percutaneous drainage catheter placement. Electronically Signed   By: Sandi Mariscal M.D.   On: 06/21/2018 15:30   Ct Biopsy  Result Date: 06/27/2018 CLINICAL DATA:  Liver lesion. Expansile lytic lesion of the right ischium. EXAM: CT GUIDED DEEP CORE BONE BIOPSY OF RIGHT ISCHIAL LESION ANESTHESIA/SEDATION: Intravenous Fentanyl 71mg and Versed 157mwere administered as  conscious sedation during continuous monitoring of the patient's level of consciousness and physiological / cardiorespiratory status by the radiology RN, with a total moderate sedation time of 8 minutes. PROCEDURE: The procedure risks, benefits, and alternatives were explained to the patient. Questions regarding the procedure were encouraged and answered. The patient understands and consents to the procedure. patient placed left lateral decubitus. select axial scans through the pelvis were obtained. the soft tissue component of the right ischial lesion was localized and an appropriate skin entry site was determined and marked. The operative field was prepped with chlorhexidinein a sterile fashion, and a sterile drape was applied covering the operative field. A sterile gown and sterile gloves were used for the procedure. Local anesthesia was provided with 1% Lidocaine. Under CT fluoroscopic guidance, a 17 gauge trocar needle was advanced to the margin of the lesion. Once needle tip position was confirmed, coaxial 18-gauge core biopsy samples were obtained, submitted in formalin to surgical pathology. The guide needle was removed. Postprocedure scans show no hemorrhage or other apparent complication. The patient tolerated the procedure well. COMPLICATIONS: None immediate FINDINGS: Soft tissue mass about the right ischium was localized. Representative core  biopsy samples obtained as above. IMPRESSION: 1. Technically successful CT-guided deep core bone biopsy, right ischial lesion. Electronically Signed   By: Lucrezia Europe M.D.   On: 06/27/2018 12:58   Portable Chest 1 View  Result Date: 06/21/2018 CLINICAL DATA:  Preoperative study.  Recent PICC line placement. EXAM: PORTABLE CHEST 1 VIEW COMPARISON:  Chest x-ray dated May 03, 2018. FINDINGS: Interval placement of a right upper extremity PICC line with the tip at the cavoatrial junction. The heart size and mediastinal contours are within normal limits. Prior CABG.  Atherosclerotic calcification of the aortic arch. Normal pulmonary vascularity. No focal consolidation, pleural effusion, or pneumothorax. Unchanged calcified granuloma in the right upper lobe. No acute osseous abnormality. Unchanged moderate to large hiatal hernia. IMPRESSION: No active disease. Electronically Signed   By: Titus Dubin M.D.   On: 06/21/2018 16:58   Vas [place] Lower Extremity Venous (dvt)  Result Date: 06/27/2018  Lower Venous Study Indications: Pelvic mass pressing on veins.  Risk Factors: Cancer patient. Performing Technologist: June Leap RDMS, RVT  Examination Guidelines: A complete evaluation includes B-mode imaging, spectral Doppler, color Doppler, and power Doppler as needed of all accessible portions of each vessel. Bilateral testing is considered an integral part of a complete examination. Limited examinations for reoccurring indications may be performed as noted.  Right Venous Findings: +---------+---------------+---------+-----------+----------+-------+            Compressibility Phasicity Spontaneity Properties Summary  +---------+---------------+---------+-----------+----------+-------+  CFV       Full            Yes       Yes                             +---------+---------------+---------+-----------+----------+-------+  SFJ       Full                                                      +---------+---------------+---------+-----------+----------+-------+  FV Prox   Full                                                      +---------+---------------+---------+-----------+----------+-------+  FV Mid    Full                                                      +---------+---------------+---------+-----------+----------+-------+  FV Distal Full                                                      +---------+---------------+---------+-----------+----------+-------+  PFV       Full                                                       +---------+---------------+---------+-----------+----------+-------+  POP       Full            Yes       Yes                             +---------+---------------+---------+-----------+----------+-------+  PTV       Full                                                      +---------+---------------+---------+-----------+----------+-------+  PERO      Full                                                      +---------+---------------+---------+-----------+----------+-------+  Soleal    Partial                                                   +---------+---------------+---------+-----------+----------+-------+  Left Venous Findings: +---------+---------------+---------+-----------+----------+-------+            Compressibility Phasicity Spontaneity Properties Summary  +---------+---------------+---------+-----------+----------+-------+  CFV       Full            Yes       Yes                             +---------+---------------+---------+-----------+----------+-------+  SFJ       Full                                                      +---------+---------------+---------+-----------+----------+-------+  FV Prox   Full                                                      +---------+---------------+---------+-----------+----------+-------+  FV Mid    Full                                                      +---------+---------------+---------+-----------+----------+-------+  FV Distal Full                                                      +---------+---------------+---------+-----------+----------+-------+  PFV       Full                                                      +---------+---------------+---------+-----------+----------+-------+  POP       Full            Yes       Yes                             +---------+---------------+---------+-----------+----------+-------+  PTV       Full                                                       +---------+---------------+---------+-----------+----------+-------+  PERO      Full                                                      +---------+---------------+---------+-----------+----------+-------+    Summary: Right: Findings consistent with acute deep vein thrombosis involving the right soleal vein. No cystic structure found in the popliteal fossa. Left: There is no evidence of deep vein thrombosis in the lower extremity. No cystic structure found in the popliteal fossa.  *See table(s) above for measurements and observations. Electronically signed by Monica Martinez MD on 06/27/2018 at 6:20:31 PM.    Final    Ir Picc Placement Right >5 Yrs Inc Img Guide  Result Date: 06/21/2018 INDICATION: Indeterminate fluid collection within the midline of the low back. Please perform ultrasound-guided aspiration for diagnostic purposes. Poor venous access. Please perform PICC line placement for long-term antibiotic administration. EXAM: 1. ULTRASOUND AND FLUOROSCOPIC GUIDED PICC LINE INSERTION 2. ULTRASOUND GUIDED ASPIRATION ILL-DEFINED FLUID COLLECTION WITHIN MIDLINE OF LOW BACK COMPARISON:  Lumbar spine MRI - 06/20/2018 MEDICATIONS: None. Anesthesia/sedation: Moderate (conscious) sedation was employed during this procedure. A total of Fentanyl 50 mcg and Dilaudid 1 mg was administered intravenously. Moderate Sedation Time: 33 minutes. The patient's level of consciousness and vital signs were monitored continuously by radiology nursing throughout the procedure under my direct supervision. CONTRAST:  None FLUOROSCOPY TIME:  36 seconds (2.3 mGy) COMPLICATIONS: None immediate. TECHNIQUE: The procedure, risks, benefits, and alternatives were explained to the patient and informed written consent was obtained. A timeout was performed prior to the initiation of the procedure. The right upper extremity was prepped with chlorhexidine in a sterile fashion, and a sterile drape was applied covering the operative field. Maximum  barrier sterile technique with sterile gowns and gloves were used for the procedure. A timeout was performed prior to the initiation of the procedure. Local anesthesia was provided with 1% lidocaine. Under direct ultrasound guidance, the basilic vein was accessed with a micropuncture kit after the overlying soft tissues were anesthetized with 1% lidocaine. After the overlying soft tissues were anesthetized, a small venotomy incision was created and a micropuncture kit was utilized to access the right basilic vein. Real-time ultrasound guidance was utilized for vascular access including the acquisition of a permanent ultrasound image documenting patency of the accessed vessel. A guidewire was advanced to the level of the superior caval-atrial junction for measurement purposes and the PICC line was cut to length. A peel-away sheath was placed and a 40 cm, 5 [place], dual lumen was inserted to level of the superior caval-atrial junction. A post procedure spot fluoroscopic was obtained. The catheter easily aspirated and flushed  and was sutured in place. A dressing was placed. _________________________________________________________ Attention was now paid towards aspiration of the ill-defined fluid collection within the midline of the low back demonstrated on preceding lumbar spine MRI. Sonographic evaluation of the soft tissues subjacent to the well-healed surgical incision within the midline of the low back demonstrated an ill-defined fluid collection correlating with ill-defined fluid collection demonstrated on preceding lumbar spine MRI As such, the overlying soft tissues were prepped and draped in usual sterile fashion. After the overlying soft tissues were anesthetized with 1% lidocaine, an 18 gauge trocar needle was advanced into the dominant ill-defined component of the collection. An ultrasound image was saved for procedural documentation purposes Next, approximately 2 cc of serous, slightly blood tinged fluid  was aspirated as the needle was slowly retracted. All aspirated fluid was capped and sent to the laboratory for analysis. A dressing was placed. The patient tolerated the above procedures well without immediate postprocedural complication. FINDINGS: After catheter placement, the tip lies within the superior cavoatrial junction. The catheter aspirates and flushes normally and is ready for immediate use. Technically successful ultrasound-guided aspiration of 2 cc of serous, slightly blood tinged fluid from the ill-defined serpiginous fluid collection within the midline of the low back, subjacent to the well-healed midline surgical incision, correlating with the ill-defined fluid collection demonstrated on preceding lumbar spine MRI. IMPRESSION: 1. Successful ultrasound and fluoroscopic guided placement of a right basilic vein approach, 40 cm, 5 French, dual lumen PICC with tip at the superior caval-atrial junction. The PICC line is ready for immediate use. 2. Successful ultrasound-guided aspiration of approximately 2 cc of serous, slightly blood tinged fluid from the ill-defined fluid collection within the midline of the low back. All aspirated fluid was capped and sent to the laboratory for analysis. Note, the fluid collection is currently too small to allow for percutaneous drainage catheter placement. Electronically Signed   By: Sandi Mariscal M.D.   On: 06/21/2018 15:30     Subjective: No concerns today. Eager to go home.  Discharge Exam: Vitals:   07/17/18 2013 07/18/18 0533  BP: 123/83 124/65  Pulse: 85 88  Resp: 18 16  Temp: 97.7 F (36.5 C) 98.4 F (36.9 C)  SpO2: 100% 95%   Vitals:   07/15/18 1009 07/17/18 0623 07/17/18 2013 07/18/18 0533  BP: (!) 155/85  123/83 124/65  Pulse: 87  85 88  Resp:   18 16  Temp:   97.7 F (36.5 C) 98.4 F (36.9 C)  TempSrc:   Oral Oral  SpO2:   100% 95%  Weight:  108.3 kg    Height:        General: Pt is alert, awake, not in acute  distress Cardiovascular: RRR, S1/S2 +, no rubs, no gallops Respiratory: CTA bilaterally, no wheezing, no rhonchi Abdominal: Soft, NT, ND, bowel sounds + Extremities: Some 1+ LE pitting edema, no cyanosis    The results of significant diagnostics from this hospitalization (including imaging, microbiology, ancillary and laboratory) are listed below for reference.     Microbiology: No results found for this or any previous visit (from the past 240 hour(s)).   Labs: BNP (last 3 results) No results for input(s): BNP in the last 8760 hours. Basic Metabolic Panel: Recent Labs  Lab 07/13/18 0355 07/14/18 0400 07/15/18 0537 07/16/18 0607 07/17/18 0424 07/18/18 0929 07/19/18 0334  NA 138 137 137 138 138 138 139  K 3.8 3.8 3.7 3.6 3.5 3.2* 4.3  CL 109 107 106 104 103 104  105  CO2 21* '22 23 25 27 24 25  ' GLUCOSE 104* 112* 103* 112* 105* 196* 110*  BUN 23 25* 27* 28* 31* 34* 33*  CREATININE 0.78 0.82 0.91 0.95 1.03 0.97 1.03  CALCIUM 6.1* 6.1* 6.3* 6.5* 6.4* 6.8* 6.8*  MG 1.8 1.5* 1.7 1.6* 1.7  --   --   PHOS 2.8 2.9 3.0 3.0 3.2  --   --    Liver Function Tests: Recent Labs  Lab 07/13/18 0355 07/14/18 0400 07/15/18 0537 07/16/18 0607 07/17/18 0424  AST 56* 54* 49* 50* 66*  ALT 100* 94* 87* 86* 105*  ALKPHOS 463* 464* 461* 502* 570*  BILITOT 0.9 0.8 0.8 1.0 0.6  PROT 5.2* 5.0* 5.0* 5.2* 5.3*  ALBUMIN 2.7* 2.4* 2.4* 2.5* 2.5*   No results for input(s): LIPASE, AMYLASE in the last 168 hours. No results for input(s): AMMONIA in the last 168 hours. CBC: Recent Labs  Lab 07/13/18 0355 07/14/18 0400 07/15/18 0537 07/16/18 0607 07/17/18 0424  WBC 7.3 6.6 6.2 6.2 5.3  NEUTROABS 6.1 5.6 5.1 4.9 4.2  HGB 10.6* 10.4* 10.7* 10.7* 10.5*  HCT 33.4* 32.8* 33.5* 32.5* 32.8*  MCV 101.8* 100.6* 100.0 100.6* 100.6*  PLT 129* 118* 125* 127* 130*   Cardiac Enzymes: No results for input(s): CKTOTAL, CKMB, CKMBINDEX, TROPONINI in the last 168 hours. BNP: Invalid input(s):  POCBNP CBG: Recent Labs  Lab 07/18/18 1117 07/18/18 1636 07/18/18 2133 07/19/18 0731 07/19/18 1135  GLUCAP 162* 141* 183* 83 93   D-Dimer No results for input(s): DDIMER in the last 72 hours. Hgb A1c No results for input(s): HGBA1C in the last 72 hours. Lipid Profile No results for input(s): CHOL, HDL, LDLCALC, TRIG, CHOLHDL, LDLDIRECT in the last 72 hours. Thyroid function studies No results for input(s): TSH, T4TOTAL, T3FREE, THYROIDAB in the last 72 hours.  Invalid input(s): FREET3 Anemia work up No results for input(s): VITAMINB12, FOLATE, FERRITIN, TIBC, IRON, RETICCTPCT in the last 72 hours. Urinalysis    Component Value Date/Time   COLORURINE YELLOW 06/27/2018 1730   APPEARANCEUR HAZY (A) 06/27/2018 1730   LABSPEC 1.025 06/27/2018 1730   PHURINE 5.0 06/27/2018 1730   GLUCOSEU NEGATIVE 06/27/2018 1730   HGBUR LARGE (A) 06/27/2018 1730   BILIRUBINUR NEGATIVE 06/27/2018 1730   KETONESUR NEGATIVE 06/27/2018 1730   PROTEINUR NEGATIVE 06/27/2018 1730   UROBILINOGEN 1.0 01/23/2012 1727   NITRITE NEGATIVE 06/27/2018 1730   LEUKOCYTESUR NEGATIVE 06/27/2018 1730     Time coordinating discharge: 35 minutes  SIGNED:   Cordelia Poche, MD [facility] 07/19/2018, 11:36 AM

## 2018-07-19 NOTE — TOC Transition Note (Signed)
Transition of Care [facility]) - CM/SW Discharge Note   Patient Details  Name: LINDBERGH WINKLES MRN: [id number] Date of Birth: 20-Mar-1934  [facility] Care [facility]) CM/SW Contact:  Leeroy Cha, RN Phone Number: 07/19/2018, 11:30 AM   Clinical Narrative:    [facility] notified of dc, spoke with the daughter [name] and discussed  Him coming home and how to get everything lined up for him.  She will also call the rn in charge of patient to discuss dc instructions   Final next level of care: [place] Barriers to Discharge: No Barriers Identified   Patient Goals and CMS Choice Patient states their goals for this hospitalization and ongoing recovery are:: to go home  CMS Medicare.gov Compare Post Acute Care list provided to:: Patient Choice offered to / list presented to : Patient  Discharge Placement                       Discharge Plan and Services   Discharge Planning Services: CM Consult Post Acute Care Choice: Home Health                    HH Arranged: RN [place]: [place] ([place]) Date [facility] Agency Contacted: 07/18/18 Time [place]: 0840 Representative spoke with at [place]: [place]  Social Determinants of Health (SDOH) Interventions     Readmission Risk Interventions No flowsheet data found.

## 2018-07-19 NOTE — Discharge Instructions (Addendum)
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 You had other issues including urinary retention, swelling, difficulty with ambulation, significant pain. Please follow-up with [facility] with regard to your cancer diagnosis. I will discharge you on oral lasix to help with swelling going forward; please have your metabolic panel checked as an outpatient. Thankfully, you have tolerated having your foley catheter taken out. Please follow up with Dr. Alinda Money as needed. You will go home on Eliquis for your DVT. While you were here, you were started on Decadron. You also have evidence of diabetes. You have been started on a low amount of insulin. Please check your blood sugar every day in the morning before breakfast.  Cordelia Poche, MD [facility]  Information on my medicine - ELIQUIS (apixaban)  This medication education was reviewed with me or my healthcare representative as part of my discharge preparation.  The pharmacist that spoke with me during my hospital stay was:   Why was Eliquis prescribed for you? Eliquis was prescribed to treat blood clots that may have been found in the veins of your legs (deep vein thrombosis) or in your lungs (pulmonary embolism) and to reduce the risk of them occurring again.  What do You need to know about Eliquis ? The starting dose is 10 mg (two 5 mg tablets) taken TWICE daily for the FIRST SEVEN (7) DAYS, then on 07/17/18 evening  the dose is reduced to ONE 5 mg tablet taken TWICE daily.  Eliquis may be taken with or without food.   Try to take the dose about the same time in the morning and in the evening. If you have difficulty swallowing the tablet whole please discuss with your pharmacist how to take the medication safely.  Take Eliquis exactly as prescribed and DO NOT stop taking Eliquis without talking to the doctor who prescribed the medication.  Stopping may  increase your risk of developing a new blood clot.  Refill your prescription before you run out.  After discharge, you should have regular check-up appointments with your healthcare provider that is prescribing your Eliquis.    What do you do if you miss a dose? If a dose of ELIQUIS is not taken at the scheduled time, take it as soon as possible on the same day and twice-daily administration should be resumed. The dose should not be doubled to make up for a missed dose.  Important Safety Information A possible side effect of Eliquis is bleeding. You should call your healthcare provider right away if you experience any of the following: ? Bleeding from an injury or your nose that does not stop. ? Unusual colored urine (red or dark brown) or unusual colored stools (red or black). ? Unusual bruising for unknown reasons. ? A serious fall or if you hit your head (even if there is no bleeding).  Some medicines may interact with Eliquis and might increase your risk of bleeding or clotting while on Eliquis. To help avoid this, consult your healthcare provider or pharmacist prior to using any new prescription or non-prescription medications, including herbals, vitamins, non-steroidal anti-inflammatory drugs (NSAIDs) and supplements.  This website has more information on Eliquis (apixaban): http://www.eliquis.com/eliquis/home

## 2018-07-19 NOTE — TOC Transition Note (Signed)
Transition of Care [facility]) - CM/SW Discharge Note   Patient Details  Name: BRENTON JOINES MRN: [id number] Date of Birth: 03/18/1934  Transition of Care [facility]) CM/SW Contact:  Leeroy Cha, RN Phone Number: 07/19/2018, 12:25 PM   Clinical Narrative:    Discharged to home with hhc care through [facility] No TOC needs present at time of discharge.   Final next level of care: [place] Barriers to Discharge: No Barriers Identified   Patient Goals and CMS Choice Patient states their goals for this hospitalization and ongoing recovery are:: to go home  CMS Medicare.gov Compare Post Acute Care list provided to:: Patient Choice offered to / list presented to : Patient  Discharge Placement                       Discharge Plan and Services   Discharge Planning Services: CM Consult Post Acute Care Choice: Home Health                    HH Arranged: RN [place]: [place] Date [facility] Agency Contacted: 07/18/18 Time [place]: 0840 Representative spoke with at [place]: [place]  Social Determinants of Health (SDOH) Interventions     Readmission Risk Interventions No flowsheet data found.

## 2018-07-19 NOTE — Progress Notes (Signed)
Patient ID: Lucas Morales, male   DOB: [birth year], 85 y.o.   MRN: [id number]    Subjective: Pt had successful voiding trial yesterday.  He is voiding about 200 cc every two hours with some dysuria although this is consistent with his baseline voiding situation chronically.  PVRs 150-200 cc have been very acceptable.  Objective: Vital signs in last 24 hours: Temp:  [97.5 F (36.4 C)-97.8 F (36.6 C)] 97.5 F (36.4 C) (05/01 0451) Pulse Rate:  [65-94] 65 (05/01 0451) Resp:  [16-20] 16 (05/01 0451) BP: (117-134)/(70-81) 122/81 (05/01 0451) SpO2:  [98 %-100 %] 100 % (05/01 0451)  Intake/Output from previous day: 04/30 0701 - 05/01 0700 In: 210 [I.V.:10] Out: 1413 [Urine:1413] Intake/Output this shift: No intake/output data recorded.  Physical Exam:  General: Alert and oriented   Lab Results: Recent Labs    07/17/18 0424  HGB 10.5*  HCT 32.8*   BMET Recent Labs    07/18/18 0929 07/19/18 0334  NA 138 139  K 3.2* 4.3  CL 104 105  CO2 24 25  GLUCOSE 196* 110*  BUN 34* 33*  CREATININE 0.97 1.03  CALCIUM 6.8* 6.8*     Studies/Results: No results found.  Assessment/Plan: Urinary retention: Pt successfully passed his voiding trial.  I would have him continue tamsulosin upon discharge.  Considering his overall situation and prognosis, he will follow up with me as needed if he has voiding difficultly in the future.   LOS: 28 days   [name] 07/19/2018, 7:35 AM

## 2018-07-19 NOTE — Progress Notes (Signed)
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  He had a Foley catheter removed.  Dr. Alinda Money did a fantastic job with this.  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  He is really not complaining much in the way of pain.  He is urinating.  I did speak with Dr. Angelina Ok at [facility].  He will be more than happy to see Dr. [name].  He is not convinced that this is a sarcoma.  He says that he does not put a lot of faith in the molecular array analysis.  We will have to see what comes of this.  He might need to have another biopsy.  Hopefully, he will be going home soon.  Maybe even today.  My office is getting everything ready for him so his family can pick up so they can bring out to [facility].  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  Maybe the Marinol is helping this.  He is on Eliquis for the thromboembolic disease.  We will continue him on this.  His atrial fibrillation is under very good rate control.  His labs today show sodium 139.  Potassium 4.3.  BUN 33 and creatinine 1.09.  His vital signs are temperature 97.5.  Pulse 65.  Blood pressure 122/81.  Head neck exam shows no ocular or oral lesions.  There are no palpable cervical or supraclavicular lymph nodes.  Lungs are clear bilaterally.  Cardiac exam shows an irregular rate and irregular rhythm consistent with atrial fibrillation.  The rate is well controlled.  Abdomen is soft.  There is still some fullness in the right side.  Extremities does show some mild edema in the legs.  Scrotal exam still shows the scrotal edema.  Neurological exam is nonfocal.  At this point, we will have to see if there will be a discharge soon.  We will then see what happens out at [facility] with the second opinion.  I will will be more than happy to follow-up in the office once everything is all done.  I think he still wants some form of therapy and we will have to see how he looks and what kind of performance status he has.  Lattie Haw,  MD  Romans 8:28

## 2018-07-20 ENCOUNTER — Encounter: Payer: Self-pay | Admitting: Family Medicine

## 2018-07-20 ENCOUNTER — Other Ambulatory Visit: Payer: Self-pay | Admitting: Family Medicine

## 2018-07-25 ENCOUNTER — Telehealth: Payer: Self-pay | Admitting: Radiology

## 2018-07-25 NOTE — Telephone Encounter (Signed)
Below documentation copied from message in patient's sons chart in regards to patient and rx for lift chair.  I spoke with patient's son. Per his previous conversation with Dr. Lorin Mercy, they did not want to have rx sent to [place] due to some difficulties they had in getting the patient's hospital bed. I gave him the number to [facility] and called them in regards to faxing a script to them and insurance. They do not file medical insurance and everyone is private pay. Lucas Morales has decided to have script faxed to [facility]. He would then like for me to email him all information faxed in case they have difficulty getting the lift chair.  Email address is [email or web address]

## 2018-07-25 NOTE — Telephone Encounter (Signed)
Faxed to [facility] at [phone]

## 2018-07-26 NOTE — Telephone Encounter (Signed)
Emailed copy to patient's son at [email or web address]

## 2018-08-05 ENCOUNTER — Encounter: Payer: Self-pay | Admitting: Radiation Oncology

## 2018-08-05 NOTE — Progress Notes (Signed)
  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     Indication for treatment:  Palliative       Radiation treatment dates:   07/04/18-07/17/18  Site/dose:   Pelvis; 30 Gy in 10 fractions of 3 Gy  Beams/energy:   Photon Isodose; 15X  Narrative: The patient tolerated radiation treatment relatively well.     At the beginning of treatment, pt reported pain being well controlled by medication. He also experienced some swelling in his right leg and groin area.  Towards the end of treatment, pt reported improvement in overall pain. Overall the pt was without complaints.   Plan: The patient has completed radiation treatment. The patient will return to radiation oncology clinic for routine followup in one month. I advised them to call or return sooner if they have any questions or concerns related to their recovery or treatment.  -----------------------------------  Blair Promise, PhD, MD  This document serves as a record of services personally performed by Gery Pray, MD. It was created on his behalf by Mary-Margaret Loma Messing, a trained medical scribe. The creation of this record is based on the scribe's personal observations and the provider's statements to them. This document has been checked and approved by the attending provider.

## 2018-08-06 ENCOUNTER — Telehealth: Payer: Self-pay

## 2018-08-06 NOTE — Telephone Encounter (Signed)
Received message to call [facility] with Dr. Pennie Banter office. Phone call placed and Colletta [place] updated regarding scheduled visit.

## 2018-08-06 NOTE — Telephone Encounter (Signed)
Late entry for 08/05/2018: Patient's daughter, [name], contacted for Palliative Care visit.  Due to the current COVID-19 infection/crises, the patient and family prefer, and have given their verbal consent for, a provider visit via telemedicine. HIPPA policies of confidentially were discussed and patient/family expressed understanding. Education provided regarding Palliative care services.  Visit scheduled for 08/07/2018

## 2018-08-07 ENCOUNTER — Other Ambulatory Visit: Payer: Self-pay

## 2018-08-07 ENCOUNTER — Other Ambulatory Visit: Payer: Medicare Other | Admitting: Adult Health Nurse Practitioner

## 2018-08-07 DIAGNOSIS — Z515 Encounter for palliative care: Secondary | ICD-10-CM

## 2018-08-08 NOTE — Progress Notes (Signed)
Therapist, nutritional Palliative Care Consult Note Telephone: [phone]  Fax: [phone]  PATIENT NAME: Lucas Morales DOB: October 21, 1933 MRN: [id number]  PRIMARY CARE PROVIDER:   Merri Brunette, MD  REFERRING PROVIDER:  Merri Brunette, MD [address] [place], [place] [address]  RESPONSIBLE PARTY:   Self H: [phone]  C: [phone] Richy Didier, daughter [phone]  Due to the COVID-19 crisis, this visit was done via telemedicine and it was initiated and consent by this patient and or family    RECOMMENDATIONS and PLAN:  1.  Metastatic cancer.  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  He has mets to lung, liver, and bone.  He is not a candidate for the aggressive treatment that would be required for this type of cancer.  He has declined hospice services for now.  Wants to pursue PT/OT to increase his mobility for as long as he can.  He was having a lot of pain of right hip and has undergone radiation therapy with much relief.  Does state that in the past couple of days he has been concerned about an increasing sensitivity that he has been feeling in that right hip. States it is more of a discomfort with about a 2/10 on pain scale. States that radiation therapy ended about 3 weeks ago.  Has concerns about the tumor in hip growing with the increased sensitivity and if he would be able to have radiation therapy.  Informed them to talk with oncologist with these questions.  Stated that he would more than likely be able to have radiation therapy again but that his oncologist would be able to determine how often and the time frame between in which this could safely be done.    2.  Mobility.  Patient does sit most of the time and is able to offer limited assistance with transfers.  Has ordered a lift chair to help with getting up from chair.   Can ambulate short distances with walker.  Feels like he is getting stronger with PT/OT.  Does need assistance with ADLs and has a home health aide that comes in to help him bathe.   3.  Decubitus ulcers.  Patient has stage II pressure wounds to bilateral buttocks.  Daughter states that there used to be 2 on each buttock but they are healing and the number has decreased.  HH is applying exuderm patches.  Daughter states that due to the nature of where the wounds are, she is having to change them frequently due to soiling.  States she cleanses the wounds, applies warm compress to wound, applies skin prep around the wound and then applies the exuderm patch.  Was asking if there was anything else that could be done.  Unfortunately, due to the location of the wounds they are going to get soiled and require frequent changing.  Did reassure her that [facility] was using an appropriate wound treatment.  Spoke with [facility] RN and was informed that she had ordered a circulating mattress for the patient on 08/06/2018 and has not heard any updates of when it will be delivered yet.  States that she has also ordered more patches and have talked with PCP to get a cream for the wounds as well.  Patient does a lot of sitting at the computer as he states that he is working on a book project. Does not have pressure cushions for  his chair.  Have instructed him to ask PT what they would recommend to help relieve pressure on his buttock wounds in his chair at home.    3.  Pain.  See above about hip pain.  States that most of his pain is from the buttock wounds.  Does try to relieve the pressure by changing positions throughout the day.  States that he goes to bed after breakfast and then again before supper time so he is not sitting on the wounds all the time.  See above for wound treatment interventions.  He does have a fentanyl patch 50  Mcg/hr and has dilaudid 8 mg Q6 hrs for pain which gives him relief.  Continue current pain regimen.  4.   Appetite.  Patient does endorse having a decreased appetite but eats 3 meals a day.  Has stated that he has lost weight but is unsure as to how much. From chart in Epic, on 07/24/2018 weighed 231 pounds with BMI of 30.38.  Family prepares and encourages him to eat.  Expressed that they should start weighing him at home.  May need to add nutritional supplement drinks in the future to keep his strength up to reach his goals of increased mobility.  Also recommended that he increase his protein intake to promote wound healing  5.  Goals of care.  Patient is a DNR and his 2 daughters are his HPOAs.  State that they have advanced care directives in place but do express wanting to update them.  Overall patient is doing well and main concern right now are the pressure wounds on the buttocks but have measures in place that will promote healing, see above.    I spent 60 minutes providing this consultation,  from 12:00 to 1:00. More than 50% of the time in this consultation was spent coordinating communication.   HISTORY OF PRESENT ILLNESS:  Lucas Morales is a 85 y.o. 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 Palliative Care was asked to help address goals of care.   CODE STATUS: DNR  PPS: 50% HOSPICE ELIGIBILITY/DIAGNOSIS: TBD  PHYSICAL EXAM:   General: patient is sitting in chair at home in NAD, but shifts often due to pain and discomfort of ulcers on buttocks Neurological: A&Ox3   PAST MEDICAL HISTORY:  Past Medical History:  Diagnosis Date   Adenomatous colon polyp    Alpha-1-antitrypsin deficiency (HCC)    Alpha-1-antitrypsin deficiency carrier    Anemia    Anxiety    Arthritis    "knees; bad in my back" (09/01/2014)   Bruises easily    Chronic bronchitis (HCC)    "get it pretty much q yr" (09/01/2014)   Chronic lower back pain    Coronary artery disease    Depression    "related to  health" (09/01/2014)   Diarrhea    has had a part of colon removed   Diverticulitis    Diverticulosis    Dyspnea on exertion    Dysrhythmia    bradycardia in the 30's (01/11/2012).. Afib briefly after CABG   Esophageal dysmotility    Esophageal stricture    Gastritis    GERD (gastroesophageal reflux disease)    takes Pantoprazole daily   Gout    Hemorrhoids    Hiatal hernia    History of colon polyps    Hyperlipidemia    doesn't require meds   Hypertension    takes Losartan daily; current  not taking    IC (interstitial cystitis)    Insomnia    takes Nortrypyline nightly   Joint pain    Joint swelling    Macular hole of both eyes    Nocturia    OSA (obstructive sleep apnea)    "mild; tried CPAP; couldn't tolerate" (09/01/2014)   Paroxysmal atrial fibrillation (HCC)    Restless leg syndrome    Sciatic nerve pain    Type II diabetes mellitus (HCC)    "borderline"   Urinary frequency     SOCIAL HX:  Social History   Tobacco Use   Smoking status: Never Smoker   Smokeless tobacco: Never Used  Substance Use Topics   Alcohol use: Yes    Comment: 09/01/2014 "glass of wine a few times/yr; if that"    ALLERGIES:  Allergies  Allergen Reactions   Morphine Itching     PERTINENT MEDICATIONS:  Outpatient Encounter Medications as of 08/07/2018  Medication Sig   allopurinol (ZYLOPRIM) 300 MG tablet Take 300 mg by mouth daily.   apixaban (ELIQUIS) 5 MG TABS tablet Take 1 tablet (5 mg total) by mouth 2 (two) times daily.   APPLE CIDER VINEGAR PO Take 1 capsule by mouth 2 (two) times daily.   atorvastatin (LIPITOR) 40 MG tablet Take 40 mg by mouth at bedtime.    blood glucose meter kit and supplies Dispense based on patient and insurance preference. Use up to four times daily as directed. (FOR ICD-10 E10.9, E11.9).   calcium elemental as carbonate (TUMS ULTRA 1000) 400 MG chewable tablet Chew 4,000 mg by mouth daily.   dexamethasone  (DECADRON) 4 MG tablet Take 1 tablet (4 mg total) by mouth daily.   fentaNYL (DURAGESIC) 50 MCG/HR Place 1 patch onto the skin every 3 (three) days.   FIBER PO Take 3 capsules by mouth daily as needed (to bulk up stool).   furosemide (LASIX) 40 MG tablet Take 1 tablet (40 mg total) by mouth daily for 30 days.   hydrocortisone cream 1 % Apply 1 application topically at bedtime as needed for itching (rash).   HYDROmorphone (DILAUDID) 8 MG tablet Take 1 tablet (8 mg total) by mouth every 6 (six) hours as needed for moderate pain.   Insulin Glargine (LANTUS) 100 UNIT/ML Solostar Pen Inject 10 Units into the skin daily.   metoprolol succinate (TOPROL-XL) 50 MG 24 hr tablet Take 1 tablet (50 mg total) by mouth daily. (Patient taking differently: Take 50 mg by mouth at bedtime. )   OLANZapine (ZYPREXA) 2.5 MG tablet Take 1 tablet (2.5 mg total) by mouth at bedtime.   ondansetron (ZOFRAN) 4 MG tablet Take 1 tablet (4 mg total) by mouth every 6 (six) hours as needed for nausea.   OVER THE COUNTER MEDICATION Apply 1 application topically daily as needed (hip pain). CBD cream and/or tincture   pantoprazole (PROTONIX) 40 MG tablet Take 1 tablet (40 mg total) by mouth 2 (two) times daily.   senna-docusate (SENOKOT S) 8.6-50 MG tablet Take 1 tablet by mouth 2 (two) times daily.   tamsulosin (FLOMAX) 0.4 MG CAPS capsule Take 1 capsule (0.4 mg total) by mouth daily.   tolnaftate (TINACTIN) 1 % cream Apply 1 application topically 3 (three) times daily as needed (itching/rash).    No facility-administered encounter medications on file as of 08/07/2018.      Klayton Monie Marlena Clipper, NP

## 2018-08-13 ENCOUNTER — Telehealth: Payer: Self-pay

## 2018-08-13 NOTE — Telephone Encounter (Signed)
Received phone call from daughter, [name]. [name] shared Patient is on olanzapine 2.5mg  for depression and appetite. He was prescribed this in the hospital by Palliative Team. Patient does not know this is for any depression or mood issues. He has always been resistant to taking any of those medications. Daughter called to ask if there is a way for a face to face visit could be done to gently assess the need to increase this medication without necessary letting him know the med is for his mood. Patient was given a 2 month prognosis in the hospital. He does not have hospice as he has Home health involved at this time. Daughter noted that he is having mood swings and is often grieving that he does not have his tomorrows and forgetting to think about today.   Visit scheduled for Friday 08/16/2018

## 2018-08-14 ENCOUNTER — Inpatient Hospital Stay: Payer: Medicare Other

## 2018-08-14 ENCOUNTER — Inpatient Hospital Stay: Payer: Medicare Other | Attending: Hematology & Oncology | Admitting: Hematology & Oncology

## 2018-08-14 ENCOUNTER — Other Ambulatory Visit: Payer: Self-pay

## 2018-08-14 DIAGNOSIS — Z79899 Other long term (current) drug therapy: Secondary | ICD-10-CM

## 2018-08-14 DIAGNOSIS — Z923 Personal history of irradiation: Secondary | ICD-10-CM

## 2018-08-14 DIAGNOSIS — C419 Malignant neoplasm of bone and articular cartilage, unspecified: Secondary | ICD-10-CM

## 2018-08-14 NOTE — Progress Notes (Signed)
Hematology and Oncology Follow Up Visit  Lucas Morales [id number] [birth year] 85 y.o. 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 twice daily     Interim History:  Lucas Morales is on the phone today.  I had a phone visit with him.  His son and daughter were with him.  He was a hospital patient that I have seen.  His initial presentation was severe pain in the back in hip.  He had scrotal swelling.  He had leg swelling.  Ultimately, he had a large mass in his right pelvis.  He had lung and liver metastasis.  A biopsy came back as a undifferentiated carcinoma.  We sent this off for molecular array testing came back 90% positive for osteosarcoma.  This would be very weird and very unusual given that osteosarcoma is our rarely seen in people his age.  He went to [facility] for a consultation after he was discharged.  He had radiation therapy which actually has helped quite a bit.  He says that his scrotal swelling is much less and his legs are not swollen.  Of note, he did have a DVT.  This was probably because of the pelvic mass pressing on his iliac vessels.  We got him on Eliquis and he is doing well on the Eliquis.  He saw Dr. Angelina Ok at [facility].  Dr. Angelina Ok has requested the pathology specimens for their review.  He is not convinced that this is an osteosarcoma.  Lucas Morales is walking.  He is getting physical therapy and Occupational Therapy.  He is having some difficulty with swallowing.  I am not sure what will be going on with this.  I do think that it would be reasonable to get him set up with a CT scan so we can see how progressive his disease is.  He and his family are in agreement to this.  He did have decubiti on his backside.  These are being tended to by his  family and are getting much better.  He does have atrial fibrillation.  He is not bothered by this.  His appetite is doing quite well.    Medications:  Current Outpatient Medications:  .  allopurinol (ZYLOPRIM) 300 MG tablet, Take 300 mg by mouth daily., Disp: , Rfl: 2 .  apixaban (ELIQUIS) 5 MG TABS tablet, Take 1 tablet (5 mg total) by mouth 2 (two) times daily., Disp: 60 tablet, Rfl: 0 .  APPLE CIDER VINEGAR PO, Take 1 capsule by mouth 2 (two) times daily., Disp: , Rfl:  .  atorvastatin (LIPITOR) 40 MG tablet, Take 40 mg by mouth at bedtime. , Disp: , Rfl:  .  blood glucose meter kit and supplies, Dispense based on patient and insurance preference. Use up to four times daily as directed. (FOR ICD-10 E10.9, E11.9)., Disp: 1 each, Rfl: 0 .  calcium elemental as carbonate (TUMS ULTRA 1000) 400 MG chewable tablet, Chew 4,000 mg by mouth daily., Disp: , Rfl:  .  dexamethasone (DECADRON) 4 MG tablet, Take 1 tablet (4 mg total) by mouth daily., Disp: 30 tablet, Rfl: 0 .  fentaNYL (DURAGESIC) 50 MCG/HR, Place 1 patch onto the skin every 3 (three) days., Disp: 5 patch, Rfl: 0 .  FIBER PO, Take 3 capsules by mouth daily as  needed (to bulk up stool)., Disp: , Rfl:  .  furosemide (LASIX) 40 MG tablet, Take 1 tablet (40 mg total) by mouth daily for 30 days., Disp: 30 tablet, Rfl: 0 .  hydrocortisone cream 1 %, Apply 1 application topically at bedtime as needed for itching (rash)., Disp: , Rfl:  .  HYDROmorphone (DILAUDID) 8 MG tablet, Take 1 tablet (8 mg total) by mouth every 6 (six) hours as needed for moderate pain., Disp: 20 tablet, Rfl: 0 .  Insulin Glargine (LANTUS) 100 UNIT/ML Solostar Pen, Inject 10 Units into the skin daily., Disp: 15 mL, Rfl: 0 .  metoprolol succinate (TOPROL-XL) 50 MG 24 hr tablet, Take 1 tablet (50 mg total) by mouth daily. (Patient taking differently: Take 50 mg by mouth at bedtime. ), Disp: 90 tablet, Rfl: 3 .  OLANZapine (ZYPREXA) 2.5 MG tablet, Take 1 tablet (2.5 mg  total) by mouth at bedtime., Disp: 30 tablet, Rfl: 0 .  ondansetron (ZOFRAN) 4 MG tablet, Take 1 tablet (4 mg total) by mouth every 6 (six) hours as needed for nausea., Disp: 20 tablet, Rfl: 0 .  OVER THE COUNTER MEDICATION, Apply 1 application topically daily as needed (hip pain). CBD cream and/or tincture, Disp: , Rfl:  .  pantoprazole (PROTONIX) 40 MG tablet, Take 1 tablet (40 mg total) by mouth 2 (two) times daily., Disp: 60 tablet, Rfl: 3 .  senna-docusate (SENOKOT S) 8.6-50 MG tablet, Take 1 tablet by mouth 2 (two) times daily., Disp: 30 tablet, Rfl: 0 .  tamsulosin (FLOMAX) 0.4 MG CAPS capsule, Take 1 capsule (0.4 mg total) by mouth daily., Disp: 30 capsule, Rfl: 0 .  tolnaftate (TINACTIN) 1 % cream, Apply 1 application topically 3 (three) times daily as needed (itching/rash). , Disp: , Rfl:   Allergies:  Allergies  Allergen Reactions  . Morphine Itching    Past Medical History, Surgical history, Social history, and Family History were reviewed and updated.  Review of Systems: Review of Systems  Constitutional: Positive for fatigue.  HENT:   Positive for trouble swallowing.   Eyes: Negative.   Respiratory: Negative.   Cardiovascular: Positive for palpitations.  Gastrointestinal: Negative.   Endocrine: Negative.   Genitourinary: Negative.    Musculoskeletal: Positive for arthralgias and myalgias.  Skin: Negative.   Neurological: Negative.   Hematological: Negative.   Psychiatric/Behavioral: Negative.     Physical Exam:  vitals were not taken for this visit.   Wt Readings from Last 3 Encounters:  07/17/18 238 lb 12.1 oz (108.3 kg)  06/04/18 235 lb (106.6 kg)  05/24/18 224 lb (101.6 kg)    Physical Exam Constitutional:      Comments: Physical exam was not conducted since the patient was at home.      Lab Results  Component Value Date   WBC 5.3 07/17/2018   HGB 10.5 (L) 07/17/2018   HCT 32.8 (L) 07/17/2018   MCV 100.6 (H) 07/17/2018   PLT 130 (L) 07/17/2018      Chemistry      Component Value Date/Time   NA 139 07/19/2018 0334   NA 142 05/25/2017 1014   NA 141 08/31/2016 1414   K 4.3 07/19/2018 0334   K 3.9 08/31/2016 1414   CL 105 07/19/2018 0334   CO2 25 07/19/2018 0334   CO2 22 08/31/2016 1414   BUN 33 (H) 07/19/2018 0334   BUN 23 05/25/2017 1014   BUN 21.6 08/31/2016 1414   CREATININE 1.03 07/19/2018 0334   CREATININE 1.1 08/31/2016 1414  Component Value Date/Time   CALCIUM 6.8 (L) 07/19/2018 0334   CALCIUM 9.5 08/31/2016 1414   ALKPHOS 570 (H) 07/17/2018 0424   ALKPHOS 60 08/31/2016 1414   AST 66 (H) 07/17/2018 0424   AST 22 08/31/2016 1414   ALT 105 (H) 07/17/2018 0424   ALT 19 08/31/2016 1414   BILITOT 0.6 07/17/2018 0424   BILITOT 1.16 08/31/2016 1414       Impression and Plan: Mr. Gregson is an 85 year old white male.  He has what appears to be an osteosarcoma that is widely metastatic.  Again, this will be a very unusual for a gentleman his age to have this type of tumor.  Hopefully, I will be able to get hold of Dr. Angelina Ok at [facility] to see if their pathologist have made any additional findings.  We will see what the CT scan shows.  We will get this early next week.  I am just happy that his quality of life is doing better.  He is really been cared for by his family.  His really is no surprise to me.  We will see what the CT scan shows.  He does need to have lab work done prior to CT scan.  We will probably do another phone visit in a couple weeks.   Volanda Napoleon, MD 5/27/20203:14 PM

## 2018-08-15 ENCOUNTER — Other Ambulatory Visit: Payer: Self-pay | Admitting: *Deleted

## 2018-08-15 ENCOUNTER — Telehealth: Payer: Self-pay | Admitting: Hematology & Oncology

## 2018-08-15 MED ORDER — FUROSEMIDE 40 MG PO TABS: 40.0000 mg | ORAL_TABLET | Freq: Every day | ORAL | 2 refills | Status: AC

## 2018-08-15 MED ORDER — APIXABAN 5 MG PO TABS: 5.0000 mg | ORAL_TABLET | Freq: Two times a day (BID) | ORAL | 0 refills | Status: AC

## 2018-08-15 MED ORDER — ATORVASTATIN CALCIUM 40 MG PO TABS: 40.0000 mg | ORAL_TABLET | Freq: Every day | ORAL | 3 refills | Status: AC

## 2018-08-15 MED ORDER — TAMSULOSIN HCL 0.4 MG PO CAPS: 0.4000 mg | ORAL_CAPSULE | Freq: Every day | ORAL | 2 refills | Status: AC

## 2018-08-15 MED ORDER — DEXAMETHASONE 4 MG PO TABS: 4.0000 mg | ORAL_TABLET | Freq: Every day | ORAL | 1 refills | Status: AC

## 2018-08-15 MED ORDER — OLANZAPINE 2.5 MG PO TABS: 2.5000 mg | ORAL_TABLET | Freq: Every day | ORAL | 0 refills | Status: AC

## 2018-08-15 MED ORDER — SENNOSIDES-DOCUSATE SODIUM 8.6-50 MG PO TABS: 1.0000 | ORAL_TABLET | Freq: Two times a day (BID) | ORAL | 2 refills | Status: AC

## 2018-08-15 NOTE — Telephone Encounter (Signed)
Lab only appt scheduled/  [facility] to call patient to schedule CT Scan per 5/27 los

## 2018-08-16 ENCOUNTER — Other Ambulatory Visit: Payer: Medicare Other | Admitting: Internal Medicine

## 2018-08-16 ENCOUNTER — Other Ambulatory Visit: Payer: Self-pay

## 2018-08-16 DIAGNOSIS — Z515 Encounter for palliative care: Secondary | ICD-10-CM

## 2018-08-19 ENCOUNTER — Inpatient Hospital Stay: Payer: Medicare Other | Attending: Hematology & Oncology

## 2018-08-19 ENCOUNTER — Other Ambulatory Visit: Payer: Self-pay

## 2018-08-19 ENCOUNTER — Ambulatory Visit (HOSPITAL_BASED_OUTPATIENT_CLINIC_OR_DEPARTMENT_OTHER)
Admission: RE | Admit: 2018-08-19 | Discharge: 2018-08-19 | Disposition: A | Discharge status: Home / Self Care | Payer: Medicare Other | Source: Ambulatory Visit | Attending: Hematology & Oncology | Admitting: Hematology & Oncology

## 2018-08-19 DIAGNOSIS — C419 Malignant neoplasm of bone and articular cartilage, unspecified: Secondary | ICD-10-CM

## 2018-08-19 DIAGNOSIS — I700 Atherosclerosis of aorta: Secondary | ICD-10-CM | POA: Insufficient documentation

## 2018-08-19 DIAGNOSIS — C787 Secondary malignant neoplasm of liver and intrahepatic bile duct: Secondary | ICD-10-CM | POA: Insufficient documentation

## 2018-08-19 DIAGNOSIS — R1900 Intra-abdominal and pelvic swelling, mass and lump, unspecified site: Secondary | ICD-10-CM | POA: Insufficient documentation

## 2018-08-19 DIAGNOSIS — E279 Disorder of adrenal gland, unspecified: Secondary | ICD-10-CM | POA: Insufficient documentation

## 2018-08-19 DIAGNOSIS — C7800 Secondary malignant neoplasm of unspecified lung: Secondary | ICD-10-CM | POA: Diagnosis not present

## 2018-08-19 MED ORDER — IOHEXOL 300 MG/ML  SOLN
100.0000 mL | Freq: Once | INTRAMUSCULAR | Status: AC | PRN
  Administered 2018-08-19: 100 mL via INTRAVENOUS

## 2018-08-20 NOTE — Progress Notes (Signed)
[facility] Consult Note Telephone: [phone]  Fax: [phone]  PATIENT NAME: Lucas Morales DOB: [birth year] MRN: [id number]  PRIMARY CARE PROVIDER:   Deland Pretty, MD  REFERRING PROVIDER:  Deland Pretty, MD [address], [place] [address]  RESPONSIBLE PARTY:   Self      RECOMMENDATIONS and PLAN:  Palliative Care Encounter Z55.1  1.  Weakness:  Relateed to protein calorie malnutrition, deconditioning and advanced metastatic disease.  Supportive care.  2.  Protein Calorie malnutrition with weight loss:  41# loss in 26 days.  Increase protein intake.  Small, frequent snacks and meals.  Comfort feeding and improve hydration.  3. Depression: Related to terminal illness.  Receptive to initiating antidepressant therapy.  Consider Remeron 25mg  q HS to improve mood, appetite and insomnia. Agreeable to discussion with Chaplain for support.  Declines evaluation by a psychologiist or psychiatrist. Son will contact Chaplain associate of pt.  May reach out to hospice chaplain prn.  4.  Advanced Care Planning:  Introduced Palliative vs Hospice Care to pt, daughter and son. Pt designated goals are to improve appetite, strength and to control R hip pain.  He would like to continue receiving PT at home.  Code status and MOST form reviewed. DNAR/DNI selected.  No desire for a feeding tube.  He was unable to make remainder of selections. [place] rod completed and MOST form left with patient for additional discussion with family.  Pt is non-committal with making immediate and longterm plans. He still declines Hospice care at this time.  He desires to sleep the majority of day and not interact with family but also expresses that he would like for his family to spend more time with him.   Palliative Care will F/U with pt on 6/19 and phone discussion after R hip CT scan on 6/1.  Prognosis per oncologist is poor.   I spent 90 minutes  providing this consultation,  from 1100 to 1230 at home. More than 50% of the time in this consultation was spent coordinating communication with pt, son and daughter.   HISTORY OF PRESENT ILLNESS:  Lucas Morales is a 85 y.o. year old male with multiple medical problems including atrial fib, T2DM, DVT, pelvic mass, osteosarcoma with metastasis to the lung and liver.  He was hospitalized from 4/3-07/19/18 due to back and Rhip pain spinal lesion. He was diagnosed with cancer of the ischium during hospitalization.   He has completed palliative radiation therapy and has been working with home PT,OT and ST.  Pt and children report weight loss since last hospitalization.  Weight in early May was 235 and now is 194#. He is ambulatory with assist from a walker but spends many hours in bed.  Requires assist with ADLs.  Poor nutritional intake and has a wound on his buttocks.  Palliative Care was asked to help address goals of care.   CODE STATUS: DNAR/DNI  PPS: 40% weak HOSPICE ELIGIBILITY/DIAGNOSIS: YES, osteosarcoma with mets to the lung and liver  PAST MEDICAL HISTORY:  Past Medical History:  Diagnosis Date  . Adenomatous colon polyp   . Alpha-1-antitrypsin deficiency ([place])   . Alpha-1-antitrypsin deficiency carrier   . Anemia   . Anxiety   . Arthritis    "knees; bad in my back" (09/01/2014)  . Bruises easily   . Chronic bronchitis ([place])    "get it pretty much q yr" (09/01/2014)  . Chronic lower back pain   . Coronary  artery disease   . Depression    "related to health" (09/01/2014)  . Diarrhea    has had a part of colon removed  . Diverticulitis   . Diverticulosis   . Dyspnea on exertion   . Dysrhythmia    bradycardia in the 30's (01/11/2012).. Afib briefly after CABG  . Esophageal dysmotility   . Esophageal stricture   . Gastritis   . GERD (gastroesophageal reflux disease)    takes Pantoprazole daily  . Gout   . Hemorrhoids   . Hiatal hernia   . History of colon polyps   .  Hyperlipidemia    doesn't require meds  . Hypertension    takes Losartan daily; current not taking   . IC (interstitial cystitis)   . Insomnia    takes Nortrypyline nightly  . Joint pain   . Joint swelling   . Macular hole of both eyes   . Nocturia   . OSA (obstructive sleep apnea)    "mild; tried CPAP; couldn't tolerate" (09/01/2014)  . Paroxysmal atrial fibrillation (HCC)   . Restless leg syndrome   . Sciatic nerve pain   . Type II diabetes mellitus ([place])    "borderline"  . Urinary frequency     PHYSICAL EXAM:   General:Cachectic. Fragile and frail appearing elderly male, thin Cardiovascular: No JVD Pulmonary: clear ant fields Abdomen: soft, nontender, + bowel sounds Extremities: no edema, no joint deformities Skin:pale with poor skin turgor. reports of R buttock ulcer but not seen Neurological: A&O x3. Weakness and tremors of BUE Psych:  Depressed mood.  Intermittently tearful  Gonzella Lex, NP

## 2018-08-22 ENCOUNTER — Ambulatory Visit: Admit: 2018-08-22 | Payer: Medicare Other | Admitting: Radiation Oncology

## 2018-09-01 ENCOUNTER — Encounter: Payer: Self-pay | Admitting: Internal Medicine

## 2018-09-23 ENCOUNTER — Telehealth: Payer: Self-pay

## 2018-09-23 NOTE — Telephone Encounter (Signed)
Per Dr Marin Olp, pt passed away on 10-05-18. Flowers sent. dph

## 2018-10-19 DEATH — deceased

## 2021-02-13 IMAGING — CR DG LUMBAR SPINE COMPLETE 4+V
5 series · 5 of 5 positions shown · non-contrast
Comparison: MRI lumbar spine 03/31/2018.

CLINICAL DATA: Intraoperative imaging for lumbar surgery.

EXAM:
LUMBAR SPINE - COMPLETE 4+ VIEW

[lateral (1 of 5)]
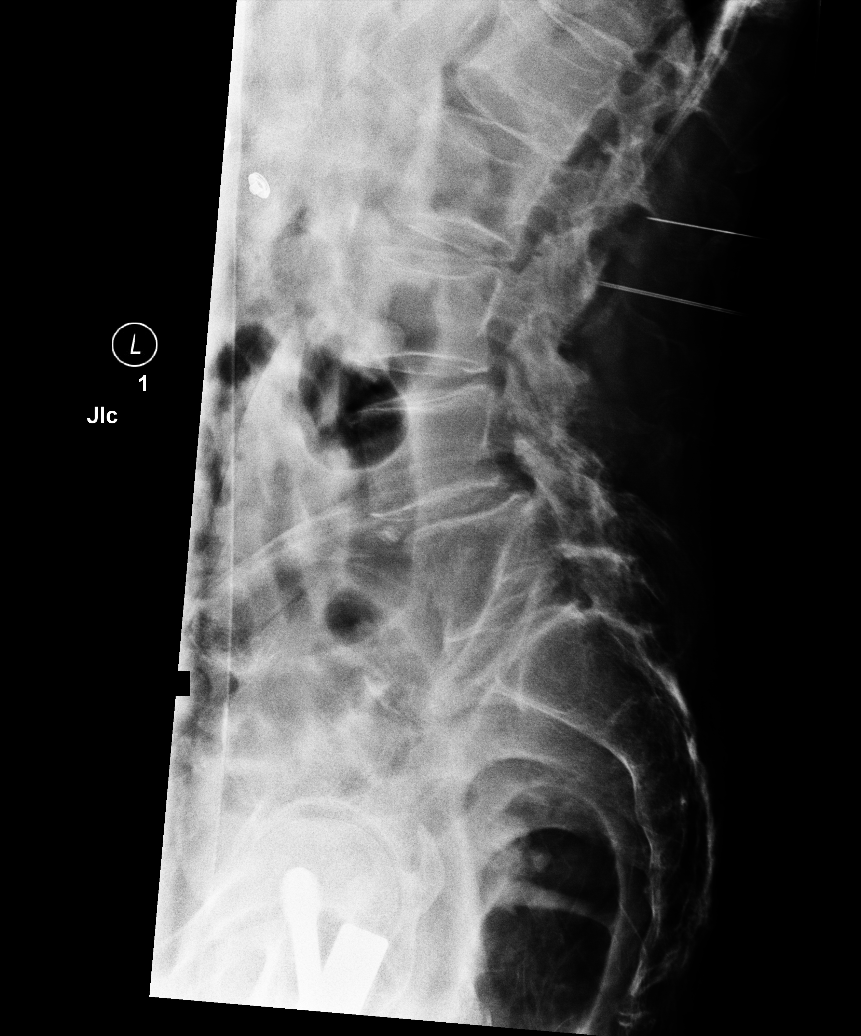

[lateral (2 of 5)]
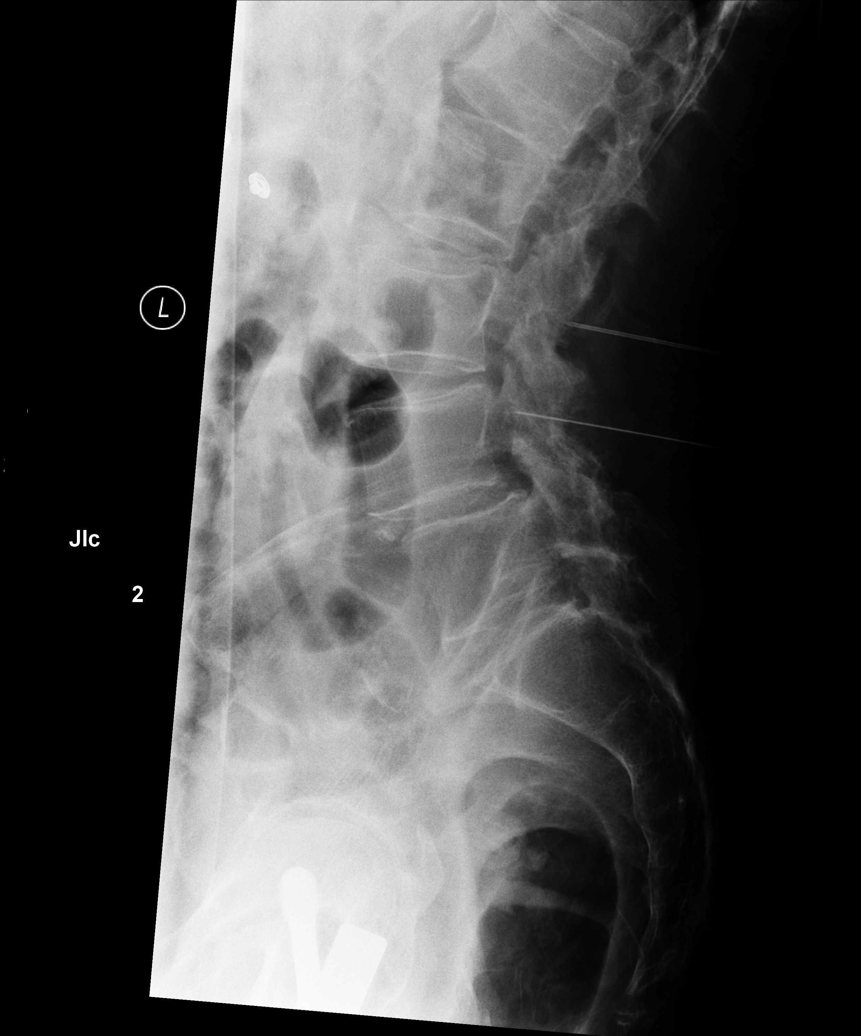

[lateral (3 of 5)]
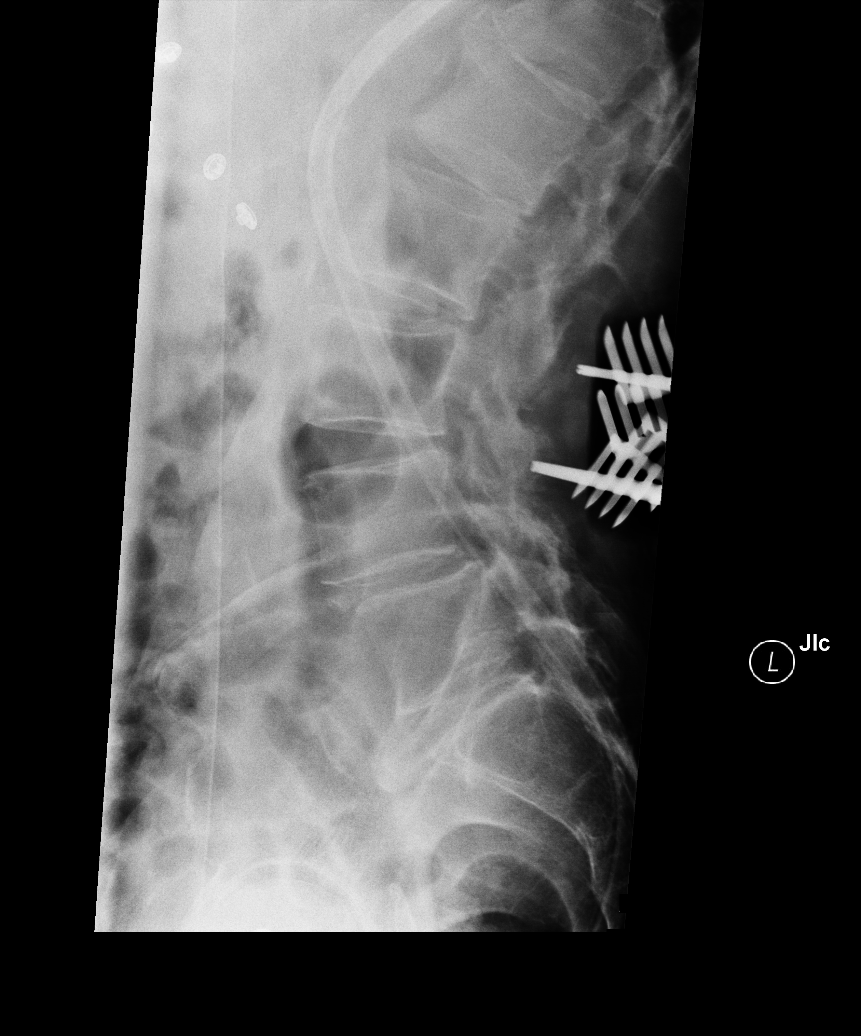

[lateral (4 of 5)]
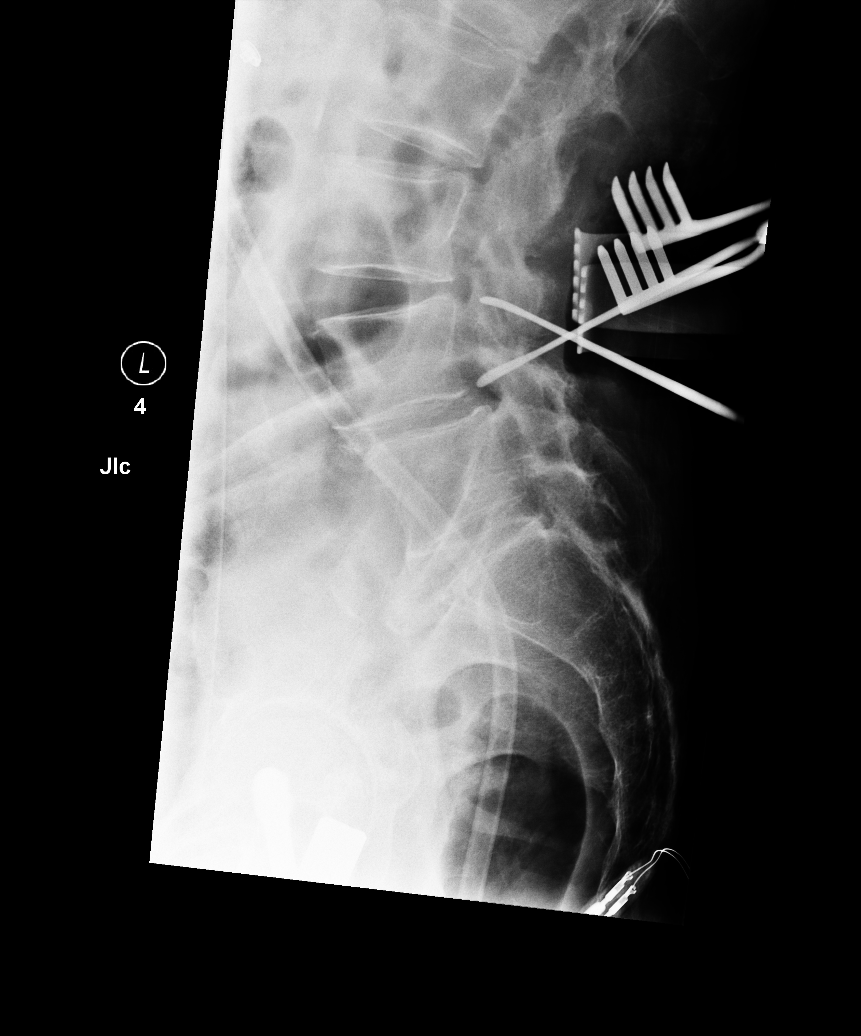

[lateral (5 of 5)]
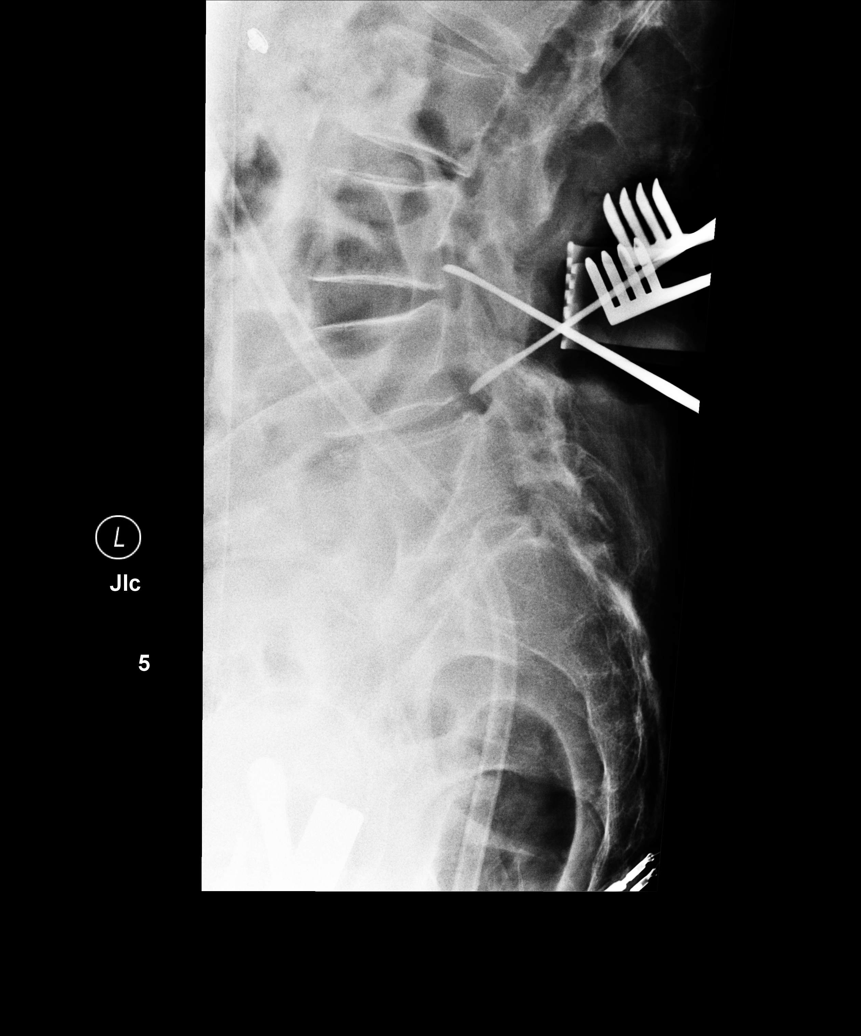

[5 of 5 positions shown; findings below may reference images not displayed]

FINDINGS: 5 images of the lumbar spine and lateral projection are provided.
Images demonstrate localization of the L3-4 and L4-5 levels on the
final 2 images. No acute abnormality.
IMPRESSION: Localization as above.
